# Patient Record
Sex: Male | Born: 1935
Health system: Southern US, Community
[De-identification: ages and names within clinical notes are randomized; demographics above are authoritative.]

## PROBLEM LIST (undated history)

## (undated) DIAGNOSIS — M48061 Spinal stenosis, lumbar region without neurogenic claudication: Secondary | ICD-10-CM

## (undated) DIAGNOSIS — E042 Nontoxic multinodular goiter: Secondary | ICD-10-CM

## (undated) DIAGNOSIS — M1990 Unspecified osteoarthritis, unspecified site: Secondary | ICD-10-CM

## (undated) DIAGNOSIS — Z860100 Personal history of colon polyps, unspecified: Secondary | ICD-10-CM

## (undated) DIAGNOSIS — I10 Essential (primary) hypertension: Secondary | ICD-10-CM

## (undated) DIAGNOSIS — I2510 Atherosclerotic heart disease of native coronary artery without angina pectoris: Secondary | ICD-10-CM

## (undated) DIAGNOSIS — N400 Enlarged prostate without lower urinary tract symptoms: Secondary | ICD-10-CM

## (undated) DIAGNOSIS — I872 Venous insufficiency (chronic) (peripheral): Secondary | ICD-10-CM

## (undated) DIAGNOSIS — IMO0002 Reserved for concepts with insufficient information to code with codable children: Secondary | ICD-10-CM

## (undated) DIAGNOSIS — R251 Tremor, unspecified: Secondary | ICD-10-CM

## (undated) DIAGNOSIS — I4891 Unspecified atrial fibrillation: Secondary | ICD-10-CM

## (undated) DIAGNOSIS — E039 Hypothyroidism, unspecified: Secondary | ICD-10-CM

## (undated) DIAGNOSIS — I739 Peripheral vascular disease, unspecified: Secondary | ICD-10-CM

## (undated) DIAGNOSIS — R9430 Abnormal result of cardiovascular function study, unspecified: Secondary | ICD-10-CM

## (undated) DIAGNOSIS — C439 Malignant melanoma of skin, unspecified: Secondary | ICD-10-CM

## (undated) DIAGNOSIS — I779 Disorder of arteries and arterioles, unspecified: Secondary | ICD-10-CM

## (undated) DIAGNOSIS — E785 Hyperlipidemia, unspecified: Secondary | ICD-10-CM

## (undated) DIAGNOSIS — R112 Nausea with vomiting, unspecified: Secondary | ICD-10-CM

## (undated) DIAGNOSIS — Z9889 Other specified postprocedural states: Secondary | ICD-10-CM

## (undated) DIAGNOSIS — M5416 Radiculopathy, lumbar region: Secondary | ICD-10-CM

## (undated) HISTORY — PX: KNEE SURGERY: SHX244

## (undated) HISTORY — DX: Essential (primary) hypertension: I10

## (undated) HISTORY — DX: Benign prostatic hyperplasia without lower urinary tract symptoms: N40.0

## (undated) HISTORY — DX: Spinal stenosis, lumbar region without neurogenic claudication: M48.061

## (undated) HISTORY — DX: Peripheral vascular disease, unspecified: I73.9

## (undated) HISTORY — DX: Venous insufficiency (chronic) (peripheral): I87.2

## (undated) HISTORY — DX: Hypothyroidism, unspecified: E03.9

## (undated) HISTORY — DX: Reserved for concepts with insufficient information to code with codable children: IMO0002

## (undated) HISTORY — DX: Malignant melanoma of skin, unspecified: C43.9

## (undated) HISTORY — DX: Personal history of colon polyps, unspecified: Z86.0100

## (undated) HISTORY — PX: TONSILLECTOMY: SUR1361

## (undated) HISTORY — DX: Atherosclerotic heart disease of native coronary artery without angina pectoris: I25.10

## (undated) HISTORY — DX: Disorder of arteries and arterioles, unspecified: I77.9

## (undated) HISTORY — DX: Nontoxic multinodular goiter: E04.2

## (undated) HISTORY — DX: Abnormal result of cardiovascular function study, unspecified: R94.30

## (undated) HISTORY — DX: Hyperlipidemia, unspecified: E78.5

## (undated) HISTORY — DX: Personal history of colonic polyps: Z86.010

## (undated) HISTORY — PX: COLONOSCOPY: SHX174

## (undated) HISTORY — DX: Tremor, unspecified: R25.1

## (undated) HISTORY — DX: Radiculopathy, lumbar region: M54.16

---

## 1995-09-22 HISTORY — PX: TOE SURGERY: SHX1073

## 1998-09-19 ENCOUNTER — Encounter: Admission: RE | Admit: 1998-09-19 | Discharge: 1998-12-18 | Payer: Self-pay | Admitting: *Deleted

## 2004-08-15 ENCOUNTER — Ambulatory Visit: Payer: Self-pay | Admitting: Internal Medicine

## 2004-09-21 HISTORY — PX: CORONARY ANGIOPLASTY WITH STENT PLACEMENT: SHX49

## 2005-07-22 ENCOUNTER — Ambulatory Visit: Payer: Self-pay | Admitting: Internal Medicine

## 2005-07-31 ENCOUNTER — Ambulatory Visit: Payer: Self-pay

## 2005-07-31 ENCOUNTER — Ambulatory Visit: Payer: Self-pay | Admitting: Cardiology

## 2005-08-03 ENCOUNTER — Ambulatory Visit: Payer: Self-pay | Admitting: Cardiology

## 2005-08-04 ENCOUNTER — Inpatient Hospital Stay (HOSPITAL_BASED_OUTPATIENT_CLINIC_OR_DEPARTMENT_OTHER): Admission: RE | Admit: 2005-08-04 | Discharge: 2005-08-04 | Payer: Self-pay | Admitting: Cardiovascular Disease

## 2005-08-04 ENCOUNTER — Ambulatory Visit: Payer: Self-pay | Admitting: Cardiovascular Disease

## 2005-08-04 ENCOUNTER — Inpatient Hospital Stay (HOSPITAL_COMMUNITY): Admission: AD | Admit: 2005-08-04 | Discharge: 2005-08-06 | Payer: Self-pay | Admitting: Cardiovascular Disease

## 2005-08-19 ENCOUNTER — Ambulatory Visit: Payer: Self-pay | Admitting: Cardiology

## 2005-08-27 ENCOUNTER — Encounter (HOSPITAL_COMMUNITY): Admission: RE | Admit: 2005-08-27 | Discharge: 2005-11-25 | Payer: Self-pay | Admitting: Cardiology

## 2005-09-01 ENCOUNTER — Ambulatory Visit: Payer: Self-pay | Admitting: Internal Medicine

## 2005-09-21 HISTORY — PX: TOTAL HIP ARTHROPLASTY: SHX124

## 2005-10-22 ENCOUNTER — Ambulatory Visit: Payer: Self-pay | Admitting: Cardiology

## 2005-10-29 ENCOUNTER — Ambulatory Visit: Payer: Self-pay | Admitting: Internal Medicine

## 2005-11-16 ENCOUNTER — Ambulatory Visit: Payer: Self-pay | Admitting: Internal Medicine

## 2005-11-26 ENCOUNTER — Encounter (HOSPITAL_COMMUNITY): Admission: RE | Admit: 2005-11-26 | Discharge: 2005-12-17 | Payer: Self-pay | Admitting: Cardiology

## 2005-11-30 ENCOUNTER — Encounter: Admission: RE | Admit: 2005-11-30 | Discharge: 2006-01-26 | Payer: Self-pay | Admitting: Internal Medicine

## 2006-04-26 ENCOUNTER — Ambulatory Visit: Payer: Self-pay | Admitting: Cardiology

## 2006-07-23 ENCOUNTER — Ambulatory Visit: Payer: Self-pay | Admitting: Internal Medicine

## 2006-07-23 LAB — CONVERTED CEMR LAB
ALT: 19 units/L (ref 0–40)
AST: 23 units/L (ref 0–37)
BUN: 11 mg/dL (ref 6–23)
Cholesterol: 164 mg/dL (ref 0–200)
Creatinine, Ser: 1 mg/dL (ref 0.4–1.5)
HDL: 43.5 mg/dL (ref >39.0)
PSA: 0.57 ng/mL (ref 0.10–4.00)
Potassium: 3.9 meq/L (ref 3.5–5.1)

## 2006-07-30 ENCOUNTER — Ambulatory Visit: Payer: Self-pay | Admitting: Cardiology

## 2006-09-15 ENCOUNTER — Inpatient Hospital Stay (HOSPITAL_COMMUNITY): Admission: RE | Admit: 2006-09-15 | Discharge: 2006-09-19 | Payer: Self-pay | Admitting: Orthopedic Surgery

## 2006-09-16 ENCOUNTER — Ambulatory Visit: Payer: Self-pay | Admitting: Internal Medicine

## 2006-11-23 ENCOUNTER — Ambulatory Visit: Payer: Self-pay | Admitting: Internal Medicine

## 2006-11-23 LAB — CONVERTED CEMR LAB
Hgb A1c MFr Bld: 5.7 % (ref 4.6–6.0)
LDL Cholesterol: 84 mg/dL (ref 0–99)
VLDL: 15 mg/dL (ref 0–40)

## 2007-01-28 ENCOUNTER — Ambulatory Visit: Payer: Self-pay | Admitting: Cardiology

## 2007-02-28 ENCOUNTER — Encounter: Payer: Self-pay | Admitting: Cardiology

## 2007-03-04 ENCOUNTER — Ambulatory Visit: Payer: Self-pay | Admitting: Cardiology

## 2007-04-28 ENCOUNTER — Ambulatory Visit: Payer: Self-pay | Admitting: Cardiology

## 2007-08-05 DIAGNOSIS — Z87898 Personal history of other specified conditions: Secondary | ICD-10-CM

## 2007-08-08 DIAGNOSIS — M1990 Unspecified osteoarthritis, unspecified site: Secondary | ICD-10-CM | POA: Insufficient documentation

## 2007-08-08 DIAGNOSIS — R011 Cardiac murmur, unspecified: Secondary | ICD-10-CM

## 2007-08-10 ENCOUNTER — Ambulatory Visit: Payer: Self-pay | Admitting: Internal Medicine

## 2007-08-10 DIAGNOSIS — E039 Hypothyroidism, unspecified: Secondary | ICD-10-CM

## 2007-08-10 DIAGNOSIS — E782 Mixed hyperlipidemia: Secondary | ICD-10-CM

## 2007-08-10 DIAGNOSIS — I10 Essential (primary) hypertension: Secondary | ICD-10-CM

## 2007-08-10 LAB — CONVERTED CEMR LAB
HDL goal, serum: 40 mg/dL
LDL Goal: 130 mg/dL

## 2007-08-13 LAB — CONVERTED CEMR LAB
ALT: 24 units/L (ref 0–53)
AST: 26 units/L (ref 0–37)
Albumin: 4.2 g/dL (ref 3.5–5.2)
Alkaline Phosphatase: 65 units/L (ref 39–117)
BUN: 12 mg/dL (ref 6–23)
Basophils Absolute: 0 10*3/uL (ref 0.0–0.1)
Basophils Relative: 0.1 % (ref 0.0–1.0)
Bilirubin, Direct: 0.1 mg/dL (ref 0.0–0.3)
CO2: 29 meq/L (ref 19–32)
Calcium: 9.7 mg/dL (ref 8.4–10.5)
Chloride: 99 meq/L (ref 96–112)
Cholesterol: 154 mg/dL (ref 0–200)
Creatinine, Ser: 1.1 mg/dL (ref 0.4–1.5)
Eosinophils Absolute: 0.4 10*3/uL (ref 0.0–0.6)
Eosinophils Relative: 5.1 % — ABNORMAL HIGH (ref 0.0–5.0)
GFR calc Af Amer: 85 mL/min
GFR calc non Af Amer: 71 mL/min
Glucose, Bld: 97 mg/dL (ref 70–99)
HCT: 49.4 % (ref 39.0–52.0)
HDL: 39.4 mg/dL (ref >39.0)
Hemoglobin: 16.9 g/dL (ref 13.0–17.0)
LDL Cholesterol: 94 mg/dL (ref 0–99)
Lymphocytes Relative: 19.3 % (ref 12.0–46.0)
MCHC: 34.2 g/dL (ref 30.0–36.0)
MCV: 92 fL (ref 78.0–100.0)
Monocytes Absolute: 0.7 10*3/uL (ref 0.2–0.7)
Monocytes Relative: 10.4 % (ref 3.0–11.0)
Neutro Abs: 4.6 10*3/uL (ref 1.4–7.7)
Neutrophils Relative %: 65.1 % (ref 43.0–77.0)
PSA: 0.62 ng/mL (ref 0.10–4.00)
Platelets: 252 10*3/uL (ref 150–400)
Potassium: 3.8 meq/L (ref 3.5–5.1)
RBC: 5.37 M/uL (ref 4.22–5.81)
RDW: 13.3 % (ref 11.5–14.6)
Sodium: 138 meq/L (ref 135–145)
TSH: 6.79 microintl units/mL — ABNORMAL HIGH (ref 0.35–5.50)
Total Bilirubin: 1.1 mg/dL (ref 0.3–1.2)
Total CHOL/HDL Ratio: 3.9
Total Protein: 7.4 g/dL (ref 6.0–8.3)
Triglycerides: 102 mg/dL (ref 0–149)
VLDL: 20 mg/dL (ref 0–40)
WBC: 7.1 10*3/uL (ref 4.5–10.5)

## 2007-08-15 ENCOUNTER — Encounter (INDEPENDENT_AMBULATORY_CARE_PROVIDER_SITE_OTHER): Payer: Self-pay | Admitting: *Deleted

## 2007-10-14 ENCOUNTER — Ambulatory Visit: Payer: Self-pay | Admitting: Gastroenterology

## 2007-10-21 ENCOUNTER — Ambulatory Visit: Payer: Self-pay | Admitting: Internal Medicine

## 2007-10-24 ENCOUNTER — Encounter (INDEPENDENT_AMBULATORY_CARE_PROVIDER_SITE_OTHER): Payer: Self-pay | Admitting: *Deleted

## 2007-10-31 ENCOUNTER — Encounter: Payer: Self-pay | Admitting: Gastroenterology

## 2007-10-31 ENCOUNTER — Encounter: Payer: Self-pay | Admitting: Internal Medicine

## 2007-10-31 ENCOUNTER — Ambulatory Visit: Payer: Self-pay | Admitting: Gastroenterology

## 2007-11-02 ENCOUNTER — Ambulatory Visit: Payer: Self-pay | Admitting: Cardiology

## 2008-04-04 ENCOUNTER — Ambulatory Visit: Payer: Self-pay | Admitting: Internal Medicine

## 2008-05-10 ENCOUNTER — Ambulatory Visit: Payer: Self-pay | Admitting: Internal Medicine

## 2008-05-10 DIAGNOSIS — D126 Benign neoplasm of colon, unspecified: Secondary | ICD-10-CM

## 2008-06-27 ENCOUNTER — Ambulatory Visit: Payer: Self-pay | Admitting: Internal Medicine

## 2008-09-21 HISTORY — PX: TOTAL HIP ARTHROPLASTY: SHX124

## 2008-10-23 ENCOUNTER — Ambulatory Visit: Payer: Self-pay | Admitting: Cardiology

## 2008-11-09 ENCOUNTER — Ambulatory Visit: Payer: Self-pay | Admitting: Internal Medicine

## 2008-11-09 DIAGNOSIS — E119 Type 2 diabetes mellitus without complications: Secondary | ICD-10-CM | POA: Insufficient documentation

## 2008-11-09 DIAGNOSIS — Z860100 Personal history of colon polyps, unspecified: Secondary | ICD-10-CM | POA: Insufficient documentation

## 2008-11-09 DIAGNOSIS — Z85820 Personal history of malignant melanoma of skin: Secondary | ICD-10-CM

## 2008-11-09 DIAGNOSIS — R739 Hyperglycemia, unspecified: Secondary | ICD-10-CM | POA: Insufficient documentation

## 2008-11-10 LAB — CONVERTED CEMR LAB
ALT: 23 units/L (ref 0–53)
AST: 27 units/L (ref 0–37)
Albumin: 4.3 g/dL (ref 3.5–5.2)
Alkaline Phosphatase: 51 units/L (ref 39–117)
BUN: 12 mg/dL (ref 6–23)
Basophils Absolute: 0 10*3/uL (ref 0.0–0.1)
Basophils Relative: 0.2 % (ref 0.0–3.0)
Bilirubin, Direct: 0.1 mg/dL (ref 0.0–0.3)
Cholesterol: 157 mg/dL (ref 0–200)
Creatinine, Ser: 1.1 mg/dL (ref 0.4–1.5)
Eosinophils Absolute: 0.3 10*3/uL (ref 0.0–0.7)
Eosinophils Relative: 4.6 % (ref 0.0–5.0)
HCT: 48.5 % (ref 39.0–52.0)
HDL: 45.6 mg/dL (ref >39.0)
Hemoglobin: 16.7 g/dL (ref 13.0–17.0)
Hgb A1c MFr Bld: 5.9 % (ref 4.6–6.0)
LDL Cholesterol: 93 mg/dL (ref 0–99)
Lymphocytes Relative: 23.3 % (ref 12.0–46.0)
MCHC: 34.5 g/dL (ref 30.0–36.0)
MCV: 91.8 fL (ref 78.0–100.0)
Monocytes Absolute: 0.6 10*3/uL (ref 0.1–1.0)
Monocytes Relative: 10.3 % (ref 3.0–12.0)
Neutro Abs: 3.4 10*3/uL (ref 1.4–7.7)
Neutrophils Relative %: 61.6 % (ref 43.0–77.0)
PSA: 0.91 ng/mL (ref 0.10–4.00)
Platelets: 191 10*3/uL (ref 150–400)
Potassium: 3.8 meq/L (ref 3.5–5.1)
RBC: 5.29 M/uL (ref 4.22–5.81)
RDW: 13.7 % (ref 11.5–14.6)
TSH: 2.44 microintl units/mL (ref 0.35–5.50)
Total Bilirubin: 1.2 mg/dL (ref 0.3–1.2)
Total CHOL/HDL Ratio: 3.4
Total Protein: 7.3 g/dL (ref 6.0–8.3)
Triglycerides: 93 mg/dL (ref 0–149)
VLDL: 19 mg/dL (ref 0–40)
WBC: 5.6 10*3/uL (ref 4.5–10.5)

## 2008-11-12 ENCOUNTER — Encounter (INDEPENDENT_AMBULATORY_CARE_PROVIDER_SITE_OTHER): Payer: Self-pay | Admitting: *Deleted

## 2008-11-21 ENCOUNTER — Telehealth (INDEPENDENT_AMBULATORY_CARE_PROVIDER_SITE_OTHER): Payer: Self-pay | Admitting: *Deleted

## 2008-11-26 ENCOUNTER — Telehealth (INDEPENDENT_AMBULATORY_CARE_PROVIDER_SITE_OTHER): Payer: Self-pay | Admitting: *Deleted

## 2008-11-27 ENCOUNTER — Ambulatory Visit: Payer: Self-pay | Admitting: Internal Medicine

## 2008-12-06 ENCOUNTER — Telehealth: Payer: Self-pay | Admitting: Internal Medicine

## 2009-01-03 ENCOUNTER — Encounter: Payer: Self-pay | Admitting: Cardiology

## 2009-08-21 ENCOUNTER — Ambulatory Visit: Payer: Self-pay | Admitting: Internal Medicine

## 2009-10-14 ENCOUNTER — Telehealth (INDEPENDENT_AMBULATORY_CARE_PROVIDER_SITE_OTHER): Payer: Self-pay | Admitting: *Deleted

## 2009-10-22 ENCOUNTER — Encounter: Payer: Self-pay | Admitting: Cardiology

## 2009-10-23 ENCOUNTER — Ambulatory Visit: Payer: Self-pay | Admitting: Cardiology

## 2009-10-29 ENCOUNTER — Telehealth (INDEPENDENT_AMBULATORY_CARE_PROVIDER_SITE_OTHER): Payer: Self-pay

## 2009-10-30 ENCOUNTER — Encounter (HOSPITAL_COMMUNITY): Admission: RE | Admit: 2009-10-30 | Discharge: 2009-12-25 | Payer: Self-pay | Admitting: Cardiology

## 2009-10-30 ENCOUNTER — Ambulatory Visit: Payer: Self-pay | Admitting: Cardiovascular Disease

## 2009-10-30 ENCOUNTER — Ambulatory Visit: Payer: Self-pay

## 2009-11-04 ENCOUNTER — Ambulatory Visit: Payer: Self-pay | Admitting: Internal Medicine

## 2009-11-11 ENCOUNTER — Ambulatory Visit: Payer: Self-pay | Admitting: Internal Medicine

## 2009-11-11 DIAGNOSIS — E876 Hypokalemia: Secondary | ICD-10-CM | POA: Insufficient documentation

## 2009-11-11 LAB — CONVERTED CEMR LAB
ALT: 23 units/L (ref 0–53)
Albumin: 4 g/dL (ref 3.5–5.2)
Alkaline Phosphatase: 55 units/L (ref 39–117)
BUN: 17 mg/dL (ref 6–23)
Bilirubin, Direct: 0 mg/dL (ref 0.0–0.3)
Cholesterol: 142 mg/dL (ref 0–200)
Creatinine, Ser: 1 mg/dL (ref 0.4–1.5)
Eosinophils Relative: 9.4 % — ABNORMAL HIGH (ref 0.0–5.0)
GFR calc non Af Amer: 78.15 mL/min (ref >60)
Lymphocytes Relative: 28.2 % (ref 12.0–46.0)
Monocytes Relative: 11.7 % (ref 3.0–12.0)
Neutrophils Relative %: 49.8 % (ref 43.0–77.0)
PSA: 0.92 ng/mL (ref 0.10–4.00)
Platelets: 214 10*3/uL (ref 150.0–400.0)
Potassium: 3.4 meq/L — ABNORMAL LOW (ref 3.5–5.1)
RBC: 4.92 M/uL (ref 4.22–5.81)
TSH: 1.76 microintl units/mL (ref 0.35–5.50)
Total Protein: 6.6 g/dL (ref 6.0–8.3)
Triglycerides: 75 mg/dL (ref 0.0–149.0)
VLDL: 15 mg/dL (ref 0.0–40.0)
WBC: 6.1 10*3/uL (ref 4.5–10.5)

## 2009-11-14 ENCOUNTER — Encounter (INDEPENDENT_AMBULATORY_CARE_PROVIDER_SITE_OTHER): Payer: Self-pay | Admitting: *Deleted

## 2009-11-14 ENCOUNTER — Ambulatory Visit: Payer: Self-pay | Admitting: Internal Medicine

## 2009-11-14 LAB — CONVERTED CEMR LAB
OCCULT 1: NEGATIVE
OCCULT 2: NEGATIVE
OCCULT 3: NEGATIVE

## 2009-11-20 ENCOUNTER — Telehealth: Payer: Self-pay | Admitting: Internal Medicine

## 2009-11-26 ENCOUNTER — Ambulatory Visit: Payer: Self-pay | Admitting: Internal Medicine

## 2009-11-26 DIAGNOSIS — R609 Edema, unspecified: Secondary | ICD-10-CM

## 2010-01-01 ENCOUNTER — Inpatient Hospital Stay (HOSPITAL_COMMUNITY): Admission: RE | Admit: 2010-01-01 | Discharge: 2010-01-05 | Payer: Self-pay | Admitting: Orthopedic Surgery

## 2010-01-10 ENCOUNTER — Emergency Department (HOSPITAL_COMMUNITY): Admission: EM | Admit: 2010-01-10 | Discharge: 2010-01-10 | Payer: Self-pay | Admitting: Emergency Medicine

## 2010-03-10 ENCOUNTER — Telehealth: Payer: Self-pay | Admitting: Cardiology

## 2010-03-31 ENCOUNTER — Ambulatory Visit: Payer: Self-pay | Admitting: Internal Medicine

## 2010-04-07 LAB — CONVERTED CEMR LAB
AST: 24 units/L (ref 0–37)
BUN: 21 mg/dL (ref 6–23)
Creatinine, Ser: 1.1 mg/dL (ref 0.4–1.5)

## 2010-05-13 ENCOUNTER — Ambulatory Visit: Payer: Self-pay | Admitting: Cardiology

## 2010-06-23 ENCOUNTER — Telehealth (INDEPENDENT_AMBULATORY_CARE_PROVIDER_SITE_OTHER): Payer: Self-pay | Admitting: *Deleted

## 2010-08-19 ENCOUNTER — Ambulatory Visit: Payer: Self-pay | Admitting: Internal Medicine

## 2010-09-11 ENCOUNTER — Telehealth (INDEPENDENT_AMBULATORY_CARE_PROVIDER_SITE_OTHER): Payer: Self-pay | Admitting: *Deleted

## 2010-10-21 NOTE — Assessment & Plan Note (Signed)
Summary: f1y  Medications Added VITAMIN C 500 MG CHEW (ASCORBIC ACID) 1-2 tab once daily LIPITOR 40 MG  TABS (ATORVASTATIN CALCIUM) 1/2 tablet  by mouth once daily DILTIAZEM HCL ER BEADS 180 MG  CP24 (DILTIAZEM HCL ER BEADS) 1 by mouth once daily FLAX SEEDS  POWD (FLAXSEED (LINSEED)) use as directed      Allergies Added: NKDA  Visit Type:  surgical clearance Referring Provider:  Aluisio Primary Provider:  Dr Alwyn Ren  CC:  CAD.  History of Present Illness: The patient is seen for cardiology followup.  He has known coronary disease.  In 2006 there was an abnormal EKG.  Patient was not having symptoms at that time.  Stress test was abnormal and he ultimately need an intervention to the circumflex and right coronary arteries.  He's done well since then.  He has not had exercise testing since then.  He had an echo in 2008 showing good LV function.  He needs to have hip surgery.  He's here for cardiac clearance.  He denies symptoms.  His wife says that during the holiday time he had some shortness of breath with exertion.  He's not had syncope or presyncope.  Problems Prior to Update: 1)  Edema  (ICD-782.3) 2)  Cad, Native Vessel  (ICD-414.01) 3)  Hypertension, Essential Nos  (ICD-401.9) 4)  Hyperlipidemia  (ICD-272.2) 5)  Uri  (ICD-465.9) 6)  Fasting Hyperglycemia  (ICD-790.29) 7)  Melanoma, Hx of  (ICD-V10.82) 8)  Skin Cancer, Hx of  (ICD-V10.83) 9)  Colonic Polyps, Hx of  (ICD-V12.72) 10)  Colonic Polyps, Hyperplastic  (ICD-211.3) 11)  Hypothyroidism  (ICD-244.9) 12)  Hx of Degenerative Joint Disease  (ICD-715.90) 13)  Hx of Murmur  (ICD-785.2) 14)  Benign Prostatic Hypertrophy, Hx of  (ICD-V13.8)  Current Medications (verified): 1)  Triamterene-Hctz 37.5-25 Mg Tabs (Triamterene-Hctz) .Marland Kitchen.. 1 By Mouth Qd 2)  Benazepril Hcl 20 Mg Tabs (Benazepril Hcl) .Marland Kitchen.. 1 By Mouth Qd 3)  Levothroid 150 Mcg Tabs (Levothyroxine Sodium) .Marland Kitchen.. 1 By Mouth Once Daily Except  1&1/2 Every Weds 4)   Metoprolol Succinate 25 Mg  Tb24 (Metoprolol Succinate) .Marland Kitchen.. 1 By Mouth Qd 5)  Cvs Aspirin 325 Mg  Tabs (Aspirin) .... Take 1 Tablet By Mouth Daily As Directed 6)  Vitamin C 500 Mg Chew (Ascorbic Acid) .Marland Kitchen.. 1-2 Tab Once Daily 7)  Lipitor 40 Mg  Tabs (Atorvastatin Calcium) .... 1/2 Tablet  By Mouth Once Daily 8)  Diltiazem Hcl Er Beads 180 Mg  Cp24 (Diltiazem Hcl Er Beads) .Marland Kitchen.. 1 By Mouth Once Daily 9)  Amoxicillin 500 Mg  Caps (Amoxicillin) .... 4 Caps 1 Hr Prior To Dental Visit 10)  Nitroglycerin 0.4 Mg Subl (Nitroglycerin) .... One Tablet Under Tongue Every 5 Minutes As Needed For Chest Pain---May Repeat Times Three 11)  Fluticasone Propionate 50 Mcg/act Susp (Fluticasone Propionate) .Marland Kitchen.. 1spray Once Daily - Two Times A Day As Needed 12)  Augmentin 500-125 Mg Tabs (Amoxicillin-Pot Clavulanate) .... Take 1 Tab  Two Times A Day With Meals 13)  Flax Seeds  Powd (Flaxseed (Linseed)) .... Use As Directed  Allergies (verified): No Known Drug Allergies  Past History:  Past Medical History: Last updated: 10/22/2009 Hypertension Hypothyroidism Hyperlipidemia Benign prostatic hypertrophy Click murmur,PMH of Colonic polyps, hx of Skin cancer, hx of, melanoma, Dr Margo Aye CAD..   DES.  Cx  and RCA...2006 Peripheral edema....venous insufficiency     Review of Systems       Patient denies fever, chills, headache, sweats, rash, change in vision,  change in hearing,chest pain, cough, nausea vomiting, urinary symptoms.  He has problems with his hip and knee surgery.  All other systems are reviewed and are negative.  Vital Signs:  Patient profile:   75 year old male Height:      67 inches Weight:      191 pounds BMI:     30.02 Pulse rate:   63 / minute Pulse rhythm:   irregular BP sitting:   130 / 70  (left arm) Cuff size:   large  Vitals Entered By: Danielle Rankin, CMA (October 23, 2009 2:32 PM)  Physical Exam  General:  patient is stable in general. Head:  head is atraumatic. Eyes:  no  xanthelasma. Neck:  no jugular venous distention. Chest Wall:  no chest wall tenderness. Lungs:  lungs are clear.  Respiratory effort is nonlabored. Heart:  cardiac exam is S1-S2.  No clicks or significant murmurs. Abdomen:  abdomen is soft. Msk:  no musculoskeletal deformities. Extremities:  no peripheral edema. Skin:  no skin rashes. Psych:  patient is oriented to person time and place.  Affect is normal   Impression & Recommendations:  Problem # 1:  EDEMA (ICD-782.3) patient has no sign of edema at this time.  No further workup is needed.  Problem # 2:  CAD, NATIVE VESSEL (ICD-414.01)  His updated medication list for this problem includes:    Benazepril Hcl 20 Mg Tabs (Benazepril hcl) .Marland Kitchen... 1 by mouth qd    Metoprolol Succinate 25 Mg Tb24 (Metoprolol succinate) .Marland Kitchen... 1 by mouth qd    Cvs Aspirin 325 Mg Tabs (Aspirin) .Marland Kitchen... Take 1 tablet by mouth daily as directed    Diltiazem Hcl Er Beads 180 Mg Cp24 (Diltiazem hcl er beads) .Marland Kitchen... 1 by mouth once daily    Nitroglycerin 0.4 Mg Subl (Nitroglycerin) ..... One tablet under tongue every 5 minutes as needed for chest pain---may repeat times three  Orders: EKG w/ Interpretation (93000) Nuclear Stress Test (Nuc Stress Test)  EKG is done today.  They're sinus rhythm.  There are no significant changes.  The EKG is reviewed by me.  Patient gives a history of some exertional shortness of breath in the past several months.  He is not worried about it but his wife is quite concerned.  He has not had any type of exercise testing since his intervention in 2006.  He has moderate risk at his current surgery that is being considered.  Pus there is question of some symptoms we will proceed with a Myoview scan.  If this is normal he'll be cleared for his surgery.  Patient Instructions: 1)  Your physician recommends that you schedule a follow-up appointment in: 6 months 2)  Lexiscan Myoview. Prescriptions: LIPITOR 40 MG  TABS (ATORVASTATIN CALCIUM)  1/2 tablet  by mouth once daily  #90 x 3   Entered by:   Danielle Rankin, CMA   Authorized by:   Talitha Givens, MD, [facility]   Signed by:   Danielle Rankin, CMA on 10/23/2009   Method used:   Electronically to        [facility]. [phone]* (retail)       [address]       [place], [place]  [address]       Ph: [phone]       Fax: [phone]   RxID:   [id number] DILTIAZEM HCL ER BEADS 180 MG  CP24 (DILTIAZEM HCL ER  BEADS) 1 by mouth once daily  #90 x 3   Entered by:   Danielle Rankin, CMA   Authorized by:   Talitha Givens, MD, [facility]   Signed by:   Danielle Rankin, CMA on 10/23/2009   Method used:   Electronically to        [facility]. [phone]* (retail)       [address]       [place], [place]  [address]       Ph: [phone]       Fax: [phone]   RxID:   [id number]

## 2010-10-21 NOTE — Progress Notes (Signed)
Summary: Med Concerns  Phone Note Refill Request Call back at [phone] Message from:  Fax from Pharmacy on June 23, 2010 10:28 AM  Refills Requested: Medication #1:  LEVOTHROID 150 MCG TABS 1 by mouth once daily except  1&1/2 every Weds [facility] [address] -fax [phone]  Initial call taken by: Okey Regal [place],  June 23, 2010 10:29 AM  Follow-up for Phone Call        VM left by pharmacist stating pt was given the levothyroxine last time and wants to know if he need to continue with this or does he need to go back to LEVOTHROID. Pt is just about out of med.pls advise..............Marland KitchenFelecia Deloach CMA  June 23, 2010 1:05 PM   Additional Follow-up for Phone Call Additional follow up Details #1::        I called the pharmacy and spoke with Luisa Hart and discussed patient to continue to receive what he always received the Levothyroid is whats on his med list. Info was ok'd and the patient already picked up med on 06/22/10 (yesterday) Additional Follow-up by: Shonna Chock CMA,  June 23, 2010 1:46 PM

## 2010-10-21 NOTE — Progress Notes (Signed)
Summary: Nuc. Pre-Procedure  Phone Note Outgoing Call Call back at [facility] Phone [phone]   Call placed by: Irean Hong, RN,  October 29, 2009 2:45 PM Summary of Call: Reviewed information on Myoview Information Sheet (see scanned document for further details).  Spoke with patient.     Nuclear Med Background Indications for Stress Test: Evaluation for Ischemia, Surgical Clearance, Stent Patency  Indications Comments: Pending  hip surgery by Dr. Lequita Halt.  History: Abnormal EKG, Echo, Heart Catheterization, Myocardial Perfusion Study   Symptoms: DOE    Nuclear Pre-Procedure Height (in): 67

## 2010-10-21 NOTE — Miscellaneous (Signed)
  Clinical Lists Changes  Observations: Added new observation of PAST MED HX: Hypertension Hypothyroidism Hyperlipidemia Benign prostatic hypertrophy Click murmur,PMH of Colonic polyps, hx of Skin cancer, hx of, melanoma, Dr Margo Aye CAD..   DES.  Cx  and RCA...2006 Peripheral edema....venous insufficiency    (10/22/2009 18:19)       Past History:  Past Medical History: Hypertension Hypothyroidism Hyperlipidemia Benign prostatic hypertrophy Click murmur,PMH of Colonic polyps, hx of Skin cancer, hx of, melanoma, Dr Margo Aye CAD..   DES.  Cx  and RCA...2006 Peripheral edema....venous insufficiency

## 2010-10-21 NOTE — Progress Notes (Signed)
Summary: Phone  Phone Note Call from Patient   Caller: Patient Summary of Call: patient has a cpx scheduled on feb 21,2011. patient is requesting labs done prior. patient has medicare complete as insurance. Can we add labs orders. Initial call taken by: Barb Merino,  October 14, 2009 4:28 PM  Follow-up for Phone Call        Yes use these codes Lipid/Hep 272.4/995.20, bmp,cbcd,tsh,psa,udip & stool cards 790.29/401.9/244.9 Follow-up by: Shonna Chock,  October 14, 2009 5:06 PM    Additional Follow-up for Phone Call Additional follow up Details #2::    left message to return call.Marland KitchenMarland KitchenMarland KitchenBarb Merino  October 15, 2009 10:47 AM   patient is scheduled for lab work on feb 14,2011.Marland KitchenMarland KitchenBarb Merino  October 15, 2009 11:50 AM   Follow-up by: Barb Merino,  October 15, 2009 10:47 AM

## 2010-10-21 NOTE — Assessment & Plan Note (Signed)
Summary: per check out/sf      Allergies Added:   Visit Type:  Follow-up Referring Provider:  Aluisio Primary Provider:  Dr Alwyn Ren  CC:  CAD.  History of Present Illness: The patient is seen for followup of coronary artery disease very I saw him last February, 2011.  Since that time he has had a hip replacement of the right hip.  The procedure went well.  He's had some swelling in his left leg.  It is mild.  Dr. Alwyn Ren has adjusted his diuretic to help keep his potassium normal.  The patient is not having any signs or symptoms of heart failure.  There is no chest pain.  He is not having shortness of breath.  Current Medications (verified): 1)  Benazepril Hcl 20 Mg Tabs (Benazepril Hcl) .... Take 1&1/2 Tab Once Daily 2)  Levothroid 150 Mcg Tabs (Levothyroxine Sodium) .Marland Kitchen.. 1 By Mouth Once Daily Except  1&1/2 Every Weds 3)  Metoprolol Succinate 25 Mg  Tb24 (Metoprolol Succinate) .Marland Kitchen.. 1 By Mouth Qd 4)  Cvs Aspirin 325 Mg  Tabs (Aspirin) .... Take 1 Tablet By Mouth Daily As Directed 5)  Vitamin C 500 Mg Chew (Ascorbic Acid) .Marland Kitchen.. 1-2 Tab Once Daily 6)  Lipitor 40 Mg  Tabs (Atorvastatin Calcium) .... 1/2 Tablet  By Mouth Once Daily 7)  Diltiazem Hcl Er Beads 180 Mg  Cp24 (Diltiazem Hcl Er Beads) .Marland Kitchen.. 1 By Mouth Once Daily 8)  Amoxicillin 500 Mg  Caps (Amoxicillin) .... 4 Caps 1 Hr Prior To Dental Visit 9)  Nitroglycerin 0.4 Mg Subl (Nitroglycerin) .... One Tablet Under Tongue Every 5 Minutes As Needed For Chest Pain---May Repeat Times Three 10)  Fluticasone Propionate 50 Mcg/act Susp (Fluticasone Propionate) .Marland Kitchen.. 1spray Once Daily - Two Times A Day As Needed 11)  Flax Seeds  Powd (Flaxseed (Linseed)) .... Use As Directed 12)  Spironolactone 25 Mg Tabs (Spironolactone) .Marland Kitchen.. 1 Once Daily  Allergies (verified): 1)  ! Codeine 2)  ! Amlodipine Besylate (Amlodipine Besylate)  Past History:  Past Medical History: Hypertension Hypothyroidism Hyperlipidemia Benign prostatic hypertrophy Click  murmur,PMH of Colonic polyps, hx of Skin cancer, hx of, melanoma, Dr Suan Halter CAD, DES, Circumflex  and RCA  2006  /   nuclear... February, 2011... EF 70%... no ischemia Venous insufficiency, PMH of     Review of Systems       Patient denies fever, chills, headache, sweats, rash,change in vision, change in hearing, chest pain, cough, nausea vomiting, urinary symptoms.  All other systems are reviewed and are negative.  Vital Signs:  Patient profile:   75 year old male Height:      67.75 inches Weight:      192 pounds BMI:     29.52 Pulse rate:   65 / minute BP sitting:   126 / 68  (right arm) Cuff size:   regular  Vitals Entered By: Hardin Negus, RMA (May 13, 2010 2:52 PM)  Physical Exam  General:  patient is overweight but stable. Eyes:  no xanthelasma. Neck:  no jugular distention. Lungs:  lungs are clear.  Respiratory effort is nonlabored. Heart:  cardiac exam revealed an S1 and S2.  No clicks or significant murmurs. Abdomen:  abdomen is soft but protuberant. Msk:  right leg is mildly swollen following his right hip replacement. Extremities:  trace edema in the left lower extremity. Skin:  no skin rashes. Psych:  patient is oriented to person time and place.  Affect is normal.   Impression &  Recommendations:  Problem # 1:  EDEMA (ICD-782.3) There is trace edema in the left lower extremity.  I believe this is from venous insufficiency.  There is no sign of heart failure.  Further workup is not needed.  He is to watch his salt intake.  Problem # 2:  CAD, NATIVE VESSEL (ICD-414.01)  His updated medication list for this problem includes:    Benazepril Hcl 20 Mg Tabs (Benazepril hcl) .Marland Kitchen... Take 1&1/2 tab once daily    Metoprolol Succinate 25 Mg Tb24 (Metoprolol succinate) .Marland Kitchen... 1 by mouth qd    Cvs Aspirin 325 Mg Tabs (Aspirin) .Marland Kitchen... Take 1 tablet by mouth daily as directed    Diltiazem Hcl Er Beads 180 Mg Cp24 (Diltiazem hcl er beads) .Marland Kitchen... 1 by mouth once daily     Nitroglycerin 0.4 Mg Subl (Nitroglycerin) ..... One tablet under tongue every 5 minutes as needed for chest pain---may repeat times three The patient's nuclear scan in February, 2011 showed normal ejection fraction and no ischemia.  No further workup is needed.  Problem # 3:  HYPERTENSION, ESSENTIAL NOS (ICD-401.9)  His updated medication list for this problem includes:    Benazepril Hcl 20 Mg Tabs (Benazepril hcl) .Marland Kitchen... Take 1&1/2 tab once daily    Metoprolol Succinate 25 Mg Tb24 (Metoprolol succinate) .Marland Kitchen... 1 by mouth qd    Cvs Aspirin 325 Mg Tabs (Aspirin) .Marland Kitchen... Take 1 tablet by mouth daily as directed    Diltiazem Hcl Er Beads 180 Mg Cp24 (Diltiazem hcl er beads) .Marland Kitchen... 1 by mouth once daily    Spironolactone 25 Mg Tabs (Spironolactone) .Marland Kitchen... 1 once daily Blood pressure is under good control.  No change in therapy.  Patient Instructions: 1)  Your physician wants you to follow-up in:  6 months.  You will receive a reminder letter in the mail two months in advance. If you don't receive a letter, please call our office to schedule the follow-up appointment. Prescriptions: LIPITOR 40 MG  TABS (ATORVASTATIN CALCIUM) 1/2 tablet  by mouth once daily  #30 x 3   Entered by:   Meredith Staggers, RN   Authorized by:   Talitha Givens, MD, [facility]   Signed by:   Meredith Staggers, RN on 05/13/2010   Method used:   Electronically to        [facility]. [phone]* (retail)       [address]       [place], [place]  [address]       Ph: [phone]       Fax: [phone]   RxID:   [id number] LIPITOR 40 MG  TABS (ATORVASTATIN CALCIUM) 1/2 tablet  by mouth once daily  #90 x 3   Entered by:   Meredith Staggers, RN   Authorized by:   Talitha Givens, MD, [facility]   Signed by:   Meredith Staggers, RN on 05/13/2010   Method used:   Electronically to        [facility]. [phone]* (retail)       [address]       [place], [place]  [address]        Ph: [phone]       Fax: [phone]   RxID:   [id number]

## 2010-10-21 NOTE — Miscellaneous (Signed)
Summary: Orders Update   Clinical Lists Changes  Medications: Rx of BENAZEPRIL HCL 20 MG TABS (BENAZEPRIL HCL) Take 1&1/2 tab once daily;  #90 x 1;  Signed;  Entered by: Marga Melnick MD;  Authorized by: Marga Melnick MD;  Method used: Print then Give to Patient Orders: Added new Test order of TLB-Albumin ([id number]-ALB) - Signed    Prescriptions: BENAZEPRIL HCL 20 MG TABS (BENAZEPRIL HCL) Take 1&1/2 tab once daily  #90 x 1   Entered and Authorized by:   Marga Melnick MD   Signed by:   Marga Melnick MD on 03/31/2010   Method used:   Print then Give to Patient   RxID:   [id number]

## 2010-10-21 NOTE — Assessment & Plan Note (Signed)
Summary: Cardiology nuclear Study  Nuclear Med Background Indications for Stress Test: Evaluation for Ischemia, Surgical Clearance, Stent Patency  Indications Comments: Pending  (R) hip surgery on 01/01/10 by Dr. Ollen Gross  History: Abnormal EKG, Echo, Heart Catheterization, Myocardial Perfusion Study, Stents  History Comments: 11/06 MPS: ischemia inferiorly, EF=68% >  Cath:VF shocks x2 >Stents-CFX, RCA; 6/08 Echo: EF= 60%  Symptoms: DOE    Nuclear Pre-Procedure Cardiac Risk Factors: Family History - CAD, History of Smoking, Hypertension, Lipids Caffeine/Decaff Intake: None NPO After: 10:00 PM Lungs: Clear IV 0.9% NS with Angio Cath: 20g     IV Site: (R) AC IV Started by: Stanton Kidney EMT-P Chest Size (in) 43     Height (in): 67 Weight (lb): 191 BMI: 30.02 Tech Comments: Metoprolol & Triam/HCTZ held > 24 hours, per Pt.  Nuclear Med Study 1 or 2 day study:  1 day     Stress Test Type:  Eugenie Birks Reading MD:  Charlton Haws, MD     Referring MD:  Willa Rough, MD; cc: Ollen Gross, MD Resting Radionuclide:  Technetium 61m Tetrofosmin     Resting Radionuclide Dose:  11.0 mCi  Stress Radionuclide:  Technetium 63m Tetrofosmin     Stress Radionuclide Dose:  33.0 mCi   Stress Protocol   Lexiscan: 0.4 mg   Stress Test Technologist:  Rea College CMA-N     Nuclear Technologist:  Burna Mortimer Deal RT-N  Rest Procedure  Myocardial perfusion imaging was performed at rest 45 minutes following the intravenous administration of Myoview Technetium 33m Tetrofosmin.  Stress Procedure  The patient received IV Lexiscan 0.4 mg over 15-seconds.  Myoview injected at 30-seconds.  There were no significant changes with lexiscan, only occasional PVC's and PJC's.  Quantitative spect images were obtained after a 45 minute delay.  QPS Raw Data Images:  Normal; no motion artifact; normal heart/lung ratio. Stress Images:  NI: Uniform and normal uptake of tracer in all myocardial segments. Rest Images:   Normal homogeneous uptake in all areas of the myocardium. Subtraction (SDS):  Normal Transient Ischemic Dilatation:  1.09  (Normal <1.22)  Lung/Heart Ratio:  .36  (Normal <0.45)  Quantitative Gated Spect Images QGS EDV:  81 ml QGS ESV:  24 ml QGS EF:  70 % QGS cine images:  normal  Findings Normal nuclear study      Overall Impression  Exercise Capacity: Lexiscan BP Response: Normal blood pressure response. Clinical Symptoms: SOB ECG Impression: No significant ST segment change suggestive of ischemia. Overall Impression: Normal stress nuclear study. Overall Impression Comments: Normal  Appended Document: Cardiology nuclear Study cleared for surgery.  Appended Document: Cardiology nuclear Study pt aware, note faxed to Dr Lequita Halt

## 2010-10-21 NOTE — Progress Notes (Signed)
Summary: reaction to med  Phone Note Call from Patient Call back at Home Phone [phone]   Caller: Patient Summary of Call: pt states that he is unable to tolerate Amlodipine 5 mg daily due to it causing his feet to swell. Pt started med 5 days ago....................Marland KitchenFelecia Deloach CMA  November 20, 2009 9:06 AM   Follow-up for Phone Call        D/C Amlodipine . Increase Benazepril to 1&1/2 once daily (30 mg). BP goal = average < 135/85. Follow-up by: Marga Melnick MD,  November 20, 2009 1:33 PM  Additional Follow-up for Phone Call Additional follow up Details #1::        pt aware med updated................Marland KitchenFelecia Deloach CMA  November 20, 2009 2:13 PM    New Allergies: ! AMLODIPINE BESYLATE (AMLODIPINE BESYLATE) New/Updated Medications: BENAZEPRIL HCL 20 MG TABS (BENAZEPRIL HCL) Take 1&1/2 tab once daily New Allergies: ! AMLODIPINE BESYLATE (AMLODIPINE BESYLATE)

## 2010-10-21 NOTE — Assessment & Plan Note (Signed)
Summary: cpx/ns/kdc   Vital Signs:  Patient profile:   75 year old male Height:      67 inches Weight:      190.6 pounds BMI:     29.96 Temp:     99.2 degrees F oral Pulse rate:   60 / minute Resp:     16 per minute BP sitting:   140 / 78  (left arm) Cuff size:   large  Vitals Entered By: Shonna Chock (November 11, 2009 2:29 PM) CC: CPX-Heart follow-up completed by Dr.Katz. Refill meds , Hypertension Management Comments REVIEWED MED LIST, PATIENT AGREED DOSE AND INSTRUCTION CORRECT    Primary Care Provider:  Dr Alwyn Ren  CC:  CPX-Heart follow-up completed by Dr.Katz. Refill meds  and Hypertension Management.  History of Present Illness: [name] is here for review of labs, risk assessment  & med refills. Dr Henrietta Hoover 10/23/2009 OV reviewed; Normal Sress Test 10/30/2009.  Hypertension History:      He denies headache, chest pain, palpitations, dyspnea with exertion, orthopnea, PND, peripheral edema, visual symptoms, neurologic problems, syncope, and side effects from treatment.  He notes no problems with any antihypertensive medication side effects.  Further comments include: BP @ home 110/60-65.        Positive major cardiovascular risk factors include male age 38 years old or older, hyperlipidemia, and hypertension.  Negative major cardiovascular risk factors include no history of diabetes, negative family history for ischemic heart disease, and non-tobacco-user status.        Positive history for target organ damage include ASHD (either angina/prior MI/prior CABG).  Further assessment for target organ damage reveals no history of stroke/TIA or peripheral vascular disease.     Allergies: 1)  ! Codeine  Past History:  Past Medical History: Hypertension Hypothyroidism Hyperlipidemia Benign prostatic hypertrophy Click murmur,PMH of Colonic polyps, hx of Skin cancer, hx of, melanoma, Dr Suan Halter CAD, DES, Circumflex  and RCA  2006 Venous insufficiency, PMH of     Past  Surgical History: Total hip replacement 2007, Dr Trudee Grip Tonsillectomy Toe Surgery [phone]) PTCA/stent 2006 Knee Surgery Colon polypectomy 10/2007 , hyperplastic polyp , Dr Arlyce Dice, repeat 2014  Family History: Father: Colon Cancer, HTN, pancreatitis Mother: carotid art disease Siblings: sister-DM; sister MI in mid 52s MGF:  DM, MI in 69s  Social History: Retired Married Former Smoker, quit in 1986 Alcohol use-yes , socially  only Regular exercise-yes: [employer] 30 min 5X/wee  Review of Systems General:  Denies fatigue, sleep disorder, and weight loss. Eyes:  Denies blurring, double vision, and vision loss-both eyes. ENT:  Denies difficulty swallowing and hoarseness. CV:  Denies leg cramps with exertion. Resp:  Denies cough and sputum productive. GI:  Denies abdominal pain, bloody stools, dark tarry stools, and indigestion. GU:  Denies discharge, dysuria, and hematuria. MS:  Complains of joint pain; denies joint redness and joint swelling; Dr Despina Hick will do [facility] 01/01/2010. Derm:  Denies changes in nail beds, dryness, lesion(s), and rash. Neuro:  Denies disturbances in coordination, numbness, poor balance, and tingling. Psych:  Denies anxiety and depression. Endo:  Denies cold intolerance, excessive hunger, excessive thirst, excessive urination, and heat intolerance. Heme:  Denies abnormal bruising and bleeding. Allergy:  Denies hives or rash, itching eyes, and sneezing; No angioedema.  Physical Exam  General:  well-nourished; alert,appropriate and cooperative throughout examination Neck:  No deformities, masses, or tenderness noted. Lungs:  Normal respiratory effort, chest expands symmetrically. Lungs are clear to auscultation, no crackles or wheezes. Heart:  regular rhythm, no murmur, no gallop, no rub, no JVD, no HJR, and bradycardia.  Report BP 130/78 Abdomen:  Bowel sounds positive,abdomen soft and non-tender without masses, organomegaly or hernias noted. Pulses:   R and L carotid,radial,dorsalis pedis and posterior tibial pulses are full and equal bilaterally Extremities:  No clubbing, cyanosis, edema, or deformity noted with normal full range of motion of all joints.   Neurologic:  alert & oriented X3 and DTRs symmetrical and normal.   Skin:  Intact without suspicious lesions or rashes Cervical Nodes:  No lymphadenopathy noted Axillary Nodes:  No palpable lymphadenopathy Psych:  memory intact for recent and remote, normally interactive, and good eye contact.     Impression & Recommendations:  Problem # 1:  HYPERTENSION, ESSENTIAL NOS (ICD-401.9) Controlled The following medications were removed from the medication list:    Triamterene-hctz 37.5-25 Mg Tabs (Triamterene-hctz) .Marland Kitchen... 1 by mouth qd His updated medication list for this problem includes:    Benazepril Hcl 20 Mg Tabs (Benazepril hcl) .Marland Kitchen... 1 by mouth qd    Metoprolol Succinate 25 Mg Tb24 (Metoprolol succinate) .Marland Kitchen... 1 by mouth qd    Diltiazem Hcl Er Beads 180 Mg Cp24 (Diltiazem hcl er beads) .Marland Kitchen... 1 by mouth once daily    Amlodipine Besylate 5 Mg Tabs (Amlodipine besylate) .Marland Kitchen... 1 once daily  Problem # 2:  HYPOKALEMIA (ICD-276.8) on Triam/ HCTZ  Problem # 3:  HYPERLIPIDEMIA (ICD-272.2) Lipids @ goal His updated medication list for this problem includes:    Lipitor 40 Mg Tabs (Atorvastatin calcium) .Marland Kitchen... 1/2 tablet  by mouth once daily  Problem # 4:  HYPOTHYROIDISM (ICD-244.9) TSH therapeutic His updated medication list for this problem includes:    Levothroid 150 Mcg Tabs (Levothyroxine sodium) .Marland Kitchen... 1 by mouth once daily except  1&1/2 every weds  Complete Medication List: 1)  Benazepril Hcl 20 Mg Tabs (Benazepril hcl) .Marland Kitchen.. 1 by mouth qd 2)  Levothroid 150 Mcg Tabs (Levothyroxine sodium) .Marland Kitchen.. 1 by mouth once daily except  1&1/2 every weds 3)  Metoprolol Succinate 25 Mg Tb24 (Metoprolol succinate) .Marland Kitchen.. 1 by mouth qd 4)  Cvs Aspirin 325 Mg Tabs (Aspirin) .... Take 1 tablet by  mouth daily as directed 5)  Vitamin C 500 Mg Chew (Ascorbic acid) .Marland Kitchen.. 1-2 tab once daily 6)  Lipitor 40 Mg Tabs (Atorvastatin calcium) .... 1/2 tablet  by mouth once daily 7)  Diltiazem Hcl Er Beads 180 Mg Cp24 (Diltiazem hcl er beads) .Marland Kitchen.. 1 by mouth once daily 8)  Amoxicillin 500 Mg Caps (Amoxicillin) .... 4 caps 1 hr prior to dental visit 9)  Nitroglycerin 0.4 Mg Subl (Nitroglycerin) .... One tablet under tongue every 5 minutes as needed for chest pain---may repeat times three 10)  Fluticasone Propionate 50 Mcg/act Susp (Fluticasone propionate) .Marland Kitchen.. 1spray once daily - two times a day as needed 11)  Flax Seeds Powd (Flaxseed (linseed)) .... Use as directed 12)  Amlodipine Besylate 5 Mg Tabs (Amlodipine besylate) .Marland Kitchen.. 1 once daily  Hypertension Assessment/Plan:      The patient's hypertensive risk group is category C: Target organ damage and/or diabetes.  Today's blood pressure is 140/78.    Patient Instructions: 1)  Stop Triam/HCTZ due to low K+; add Amlodipine 5 mg daily.  2)  Check your Blood Pressure regularly. If it is above: 135/85 ON AVERAGE you should make an appointment. Prescriptions: AMLODIPINE BESYLATE 5 MG TABS (AMLODIPINE BESYLATE) 1 once daily  #30 x 5   Entered and Authorized by:   Marga Melnick  MD   Signed by:   Marga Melnick MD on 11/11/2009   Method used:   Faxed to ...       [facility] [phone]* (retail)       [address]       [place], [place]  [address]       Ph: [phone]       Fax: [phone]   RxID:   [id number] METOPROLOL SUCCINATE 25 MG  TB24 (METOPROLOL SUCCINATE) 1 by mouth qd  #90.0 Each x 3   Entered and Authorized by:   Marga Melnick MD   Signed by:   Marga Melnick MD on 11/11/2009   Method used:   Faxed to ...       [facility] [phone]* (retail)       [address]       [place], [place]  [address]       Ph: [phone]       Fax: [phone]   RxID:    [id number] LEVOTHROID 150 MCG TABS (LEVOTHYROXINE SODIUM) 1 by mouth once daily except  1&1/2 every Weds  #90.0 Each x 3   Entered and Authorized by:   Marga Melnick MD   Signed by:   Marga Melnick MD on 11/11/2009   Method used:   Faxed to ...       [facility] [phone]* (retail)       [address]       [place], [place]  [address]       Ph: [phone]       Fax: [phone]   RxID:   [id number] BENAZEPRIL HCL 20 MG TABS (BENAZEPRIL HCL) 1 by mouth qd  #90.0 Each x 3   Entered and Authorized by:   Marga Melnick MD   Signed by:   Marga Melnick MD on 11/11/2009   Method used:   Faxed to ...       [facility] [phone]* (retail)       [address]       [place], [place]  [address]       Ph: [phone]       Fax: [phone]   RxID:   [id number]

## 2010-10-21 NOTE — Assessment & Plan Note (Signed)
Summary: swelling in feet//fd   Vital Signs:  Patient profile:   75 year old male Weight:      195 pounds Pulse rate:   66 / minute Resp:     15 per minute BP sitting:   130 / 82  (left arm)  Vitals Entered By: Doristine Devoid (November 26, 2009 3:08 PM) CC: swellen in both feet x1wk since change in med   Primary Care Provider:  Dr Alwyn Ren  CC:  swellen in both feet x1wk since change in med.  History of Present Illness: Edema better , 30-40% , off Amlodipine. Labs reviewed & risks discussed. Creat 1.0 & K+  3.4 on Triam/ HCTZ; this was D/C ed & Amlodipine started. BP @ home well controlled off CCB.  Allergies: 1)  ! Codeine 2)  ! Amlodipine Besylate (Amlodipine Besylate)  Review of Systems CV:  Complains of swelling of feet; denies difficulty breathing at night, difficulty breathing while lying down, shortness of breath with exertion, and swelling of hands.  Physical Exam  General:  well-nourished, alert,appropriate and cooperative throughout examination Lungs:  Normal respiratory effort, chest expands symmetrically. Lungs are clear to auscultation, no crackles or wheezes. Heart:  regular rhythm, bradycardia, and grade 1/2  /6 systolic murmur LSB.   Pulses:  R and L carotid,radial,dorsalis pedis and posterior tibial pulses are full and equal bilaterally Extremities:  trace left pedal edema and trace right pedal edema.     Impression & Recommendations:  Problem # 1:  HYPOKALEMIA (ICD-276.8) from HCTZ in Triam/ HCTZ; risk of dysrrhythmias discussed  Problem # 2:  EDEMA (ICD-782.3)  His updated medication list for this problem includes:    Spironolactone 25 Mg Tabs (Spironolactone) .Marland Kitchen... 1 once daily  Complete Medication List: 1)  Benazepril Hcl 20 Mg Tabs (Benazepril hcl) .... Take 1&1/2 tab once daily 2)  Levothroid 150 Mcg Tabs (Levothyroxine sodium) .Marland Kitchen.. 1 by mouth once daily except  1&1/2 every weds 3)  Metoprolol Succinate 25 Mg Tb24 (Metoprolol succinate) .Marland Kitchen.. 1 by  mouth qd 4)  Cvs Aspirin 325 Mg Tabs (Aspirin) .... Take 1 tablet by mouth daily as directed 5)  Vitamin C 500 Mg Chew (Ascorbic acid) .Marland Kitchen.. 1-2 tab once daily 6)  Lipitor 40 Mg Tabs (Atorvastatin calcium) .... 1/2 tablet  by mouth once daily 7)  Diltiazem Hcl Er Beads 180 Mg Cp24 (Diltiazem hcl er beads) .Marland Kitchen.. 1 by mouth once daily 8)  Amoxicillin 500 Mg Caps (Amoxicillin) .... 4 caps 1 hr prior to dental visit 9)  Nitroglycerin 0.4 Mg Subl (Nitroglycerin) .... One tablet under tongue every 5 minutes as needed for chest pain---may repeat times three 10)  Fluticasone Propionate 50 Mcg/act Susp (Fluticasone propionate) .Marland Kitchen.. 1spray once daily - two times a day as needed 11)  Flax Seeds Powd (Flaxseed (linseed)) .... Use as directed 12)  Spironolactone 25 Mg Tabs (Spironolactone) .Marland Kitchen.. 1 once daily  Patient Instructions: 1)  Limit your Sodium (Salt) to less than 4 grams a day (slightly less than 1 teaspoon) to prevent fluid retention, swelling, or worsening or symptoms. Prescriptions: SPIRONOLACTONE 25 MG TABS (SPIRONOLACTONE) 1 once daily  #30 x 5   Entered and Authorized by:   Marga Melnick MD   Signed by:   Marga Melnick MD on 11/26/2009   Method used:   Faxed to ...       [facility] [phone]* (retail)       [phone]  [place], [place]  [address]       Ph: [phone]       Fax: [phone]   RxID:   [id number]

## 2010-10-21 NOTE — Progress Notes (Signed)
Summary: pt request call    Phone Note Call from Patient Call back at Home Phone [phone]   Caller: Patient Summary of Call: Pt request call Initial call taken by: Judie Grieve,  March 10, 2010 10:20 AM  Follow-up for Phone Call        spoke w/pt Follow-up by: Meredith Staggers, RN,  March 10, 2010 10:54 AM

## 2010-10-21 NOTE — Assessment & Plan Note (Signed)
Summary: flu shot/cbs   Nurse Visit  CC: Flu shot/kb   Allergies: 1)  ! Codeine 2)  ! Amlodipine Besylate (Amlodipine Besylate)  Orders Added: 1)  Flu Vaccine 68yrs + MEDICARE PATIENTS [Q2039] 2)  Administration Flu vaccine - MCR [G0008]       Flu Vaccine Consent Questions     Do you have a history of severe allergic reactions to this vaccine? no    Any prior history of allergic reactions to egg and/or gelatin? no    Do you have a sensitivity to the preservative Thimersol? no    Do you have a past history of Guillan-Barre Syndrome? no    Do you currently have an acute febrile illness? no    Have you ever had a severe reaction to latex? no    Vaccine information given and explained to patient? yes    Are you currently pregnant? no    Lot Number:[id number]   Exp Date:03/21/2011   Site Given  Left Deltoid IMu

## 2010-10-21 NOTE — Letter (Signed)
Summary: Results Follow up Letter  [facility]  [address] [place], [place] [address]   Phone: [phone]  Fax: [phone]    11/14/2009 MRN: [id number]  [facility] [address] [place], [place]  [address]  Dear [name],  The following are the results of your recent test(s):  Test         Result    Pap Smear:        Normal _____  Not Normal _____ Comments: ______________________________________________________ Cholesterol: LDL(Bad cholesterol):         Your goal is less than:         HDL (Good cholesterol):       Your goal is more than: Comments:  ______________________________________________________ Mammogram:        Normal _____  Not Normal _____ Comments:  ___________________________________________________________________ Hemoccult:        Normal _X____  Not normal _______ Comments:    _____________________________________________________________________ Other Tests:    We routinely do not discuss normal results over the telephone.  If you desire a copy of the results, or you have any questions about this information we can discuss them at your next office visit.   Sincerely,

## 2010-10-21 NOTE — Assessment & Plan Note (Signed)
Summary: leg swollen/cbs   Vital Signs:  Patient profile:   75 year old male Height:      67.75 inches Weight:      194.8 pounds BMI:     29.95 Temp:     98.6 degrees F oral Pulse rate:   64 / minute Resp:     14 per minute BP sitting:   126 / 78  (left arm) Cuff size:   large  Vitals Entered By: Shonna Chock (March 31, 2010 12:12 PM) CC: Left leg swelling -no pain, Hypertension Management Comments REVIEWED MED LIST, PATIENT AGREED DOSE AND INSTRUCTION CORRECT    Primary Care Provider:  Dr Alwyn Ren  CC:  Left leg swelling -no pain and Hypertension Management.  History of Present Illness: Edema  noted by Dr Despina Hick 03/21/2010; he was unaware of it . He is not on  Amolidipine . Weight is stable.He restricts  salt in diet.  Hypertension History:      He denies headache, chest pain, palpitations, dyspnea with exertion, orthopnea, PND, visual symptoms, neurologic problems, and syncope.  BP @ home 120/56 ON AVERAGE.        Positive major cardiovascular risk factors include male age 46 years old or older, hyperlipidemia, and hypertension.  Negative major cardiovascular risk factors include no history of diabetes, negative family history for ischemic heart disease, and non-tobacco-user status.        Positive history for target organ damage include ASHD (either angina/prior MI/prior CABG).  Further assessment for target organ damage reveals no history of stroke/TIA or peripheral vascular disease.     Allergies: 1)  ! Codeine 2)  ! Amlodipine Besylate (Amlodipine Besylate)  Review of Systems Eyes:  Denies blurring, double vision, and vision loss-both eyes. CV:  Denies difficulty breathing at night, difficulty breathing while lying down, and leg cramps with exertion. Neuro:  Denies disturbances in coordination, numbness, poor balance, and tingling.  Physical Exam  General:  well-nourished,in no acute distress; alert,appropriate and cooperative throughout examination Lungs:  Normal  respiratory effort, chest expands symmetrically. Lungs are clear to auscultation, no crackles or wheezes. Heart:  bradycardia and irregular rhythm.   Abdomen:  Bowel sounds positive,abdomen soft and non-tender without masses, organomegaly or hernias noted. Pulses:  R and L carotid,radial,dorsalis pedis and posterior tibial pulses are full and equal bilaterally Extremities:  1/2+ left pedal edema and 1/2 + right pedal edema.   Neurologic:  alert & oriented X3.     Impression & Recommendations:  Problem # 1:  EDEMA (ICD-782.3)  His updated medication list for this problem includes:    Spironolactone 25 Mg Tabs (Spironolactone) .Marland Kitchen... 1 once daily  Orders: Venipuncture (84696) TLB-ALT (SGPT) (84460-ALT) TLB-AST (SGOT) (84450-SGOT) TLB-Creatinine, Blood (82565-CREA) TLB-Potassium (K+) (84132-K) TLB-BUN (Urea Nitrogen) (84520-BUN)  Problem # 2:  HYPERTENSION, ESSENTIAL NOS (ICD-401.9)  controlled His updated medication list for this problem includes:    Benazepril Hcl 20 Mg Tabs (Benazepril hcl) .Marland Kitchen... Take 1&1/2 tab once daily    Metoprolol Succinate 25 Mg Tb24 (Metoprolol succinate) .Marland Kitchen... 1 by mouth qd    Diltiazem Hcl Er Beads 180 Mg Cp24 (Diltiazem hcl er beads) .Marland Kitchen... 1 by mouth once daily    Spironolactone 25 Mg Tabs (Spironolactone) .Marland Kitchen... 1 once daily  Orders: Venipuncture (29528) TLB-Creatinine, Blood (82565-CREA) TLB-Potassium (K+) (84132-K) TLB-BUN (Urea Nitrogen) (84520-BUN)  Complete Medication List: 1)  Benazepril Hcl 20 Mg Tabs (Benazepril hcl) .... Take 1&1/2 tab once daily 2)  Levothroid 150 Mcg Tabs (Levothyroxine sodium) .Marland Kitchen.. 1 by  mouth once daily except  1&1/2 every weds 3)  Metoprolol Succinate 25 Mg Tb24 (Metoprolol succinate) .Marland Kitchen.. 1 by mouth qd 4)  Cvs Aspirin 325 Mg Tabs (Aspirin) .... Take 1 tablet by mouth daily as directed 5)  Vitamin C 500 Mg Chew (Ascorbic acid) .Marland Kitchen.. 1-2 tab once daily 6)  Lipitor 40 Mg Tabs (Atorvastatin calcium) .... 1/2 tablet  by  mouth once daily 7)  Diltiazem Hcl Er Beads 180 Mg Cp24 (Diltiazem hcl er beads) .Marland Kitchen.. 1 by mouth once daily 8)  Amoxicillin 500 Mg Caps (Amoxicillin) .... 4 caps 1 hr prior to dental visit 9)  Nitroglycerin 0.4 Mg Subl (Nitroglycerin) .... One tablet under tongue every 5 minutes as needed for chest pain---may repeat times three 10)  Fluticasone Propionate 50 Mcg/act Susp (Fluticasone propionate) .Marland Kitchen.. 1spray once daily - two times a day as needed 11)  Flax Seeds Powd (Flaxseed (linseed)) .... Use as directed 12)  Spironolactone 25 Mg Tabs (Spironolactone) .Marland Kitchen.. 1 once daily  Hypertension Assessment/Plan:      The patient's hypertensive risk group is category C: Target organ damage and/or diabetes.  Today's blood pressure is 126/78.    Patient Instructions: 1)  Limit your Sodium (Salt) to less than 4 grams a day (slightly less than 1 teaspoon) to prevent fluid retention, swelling, or worsening or symptoms. Use No Salt ; spices(Ex peppers)  or Mrs. Sharilyn Sites

## 2010-10-23 NOTE — Progress Notes (Signed)
Summary: Med alternate  Phone Note Call from Patient Call back at Home Phone [phone]   Summary of Call: Patient called noting that his Metoprolol will go from $5 up to $45. They suggested he change to:  Carvedilol, Metoprolo Tartrate, or Atenolol. Please send alternate to [facility] [address]. Pt is aware that our office will send alternate directly to the pharmacy. Please advise. Initial call taken by: Lucious Groves CMA,  September 11, 2010 4:09 PM  Follow-up for Phone Call        see Rx Follow-up by: Marga Melnick MD,  September 11, 2010 5:38 PM  Additional Follow-up for Phone Call Additional follow up Details #1::        RX faxed  Additional Follow-up by: Shonna Chock CMA,  September 12, 2010 8:15 AM    New/Updated Medications: METOPROLOL TARTRATE 25 MG TABS (METOPROLOL TARTRATE) 1/2 two times a day Prescriptions: METOPROLOL TARTRATE 25 MG TABS (METOPROLOL TARTRATE) 1/2 two times a day  #90 x 1   Entered and Authorized by:   Marga Melnick MD   Signed by:   Marga Melnick MD on 09/11/2010   Method used:   Print then Mail to Patient   RxID:   [id number]

## 2010-10-31 ENCOUNTER — Encounter: Payer: Self-pay | Admitting: Internal Medicine

## 2010-11-18 NOTE — Letter (Signed)
Summary: [facility]   Imported By: Maryln Gottron 11/11/2010 15:51:17  _____________________________________________________________________  External Attachment:    Type:   Image     Comment:   External Document

## 2010-12-09 LAB — PROTIME-INR
INR: 1.79 — ABNORMAL HIGH (ref 0.00–1.49)
INR: 2.21 — ABNORMAL HIGH (ref 0.00–1.49)
INR: 2.46 — ABNORMAL HIGH (ref 0.00–1.49)
Prothrombin Time: 20.6 seconds — ABNORMAL HIGH (ref 11.6–15.2)
Prothrombin Time: 20.9 seconds — ABNORMAL HIGH (ref 11.6–15.2)
Prothrombin Time: 24.3 seconds — ABNORMAL HIGH (ref 11.6–15.2)
Prothrombin Time: 26.5 seconds — ABNORMAL HIGH (ref 11.6–15.2)

## 2010-12-09 LAB — CBC
Hemoglobin: 12.4 g/dL — ABNORMAL LOW (ref 13.0–17.0)
MCHC: 32.7 g/dL (ref 30.0–36.0)
MCHC: 33.1 g/dL (ref 30.0–36.0)
MCHC: 33.6 g/dL (ref 30.0–36.0)
MCV: 95 fL (ref 78.0–100.0)
Platelets: 173 10*3/uL (ref 150–400)
RBC: 3.72 MIL/uL — ABNORMAL LOW (ref 4.22–5.81)
RBC: 3.95 MIL/uL — ABNORMAL LOW (ref 4.22–5.81)
RBC: 3.99 MIL/uL — ABNORMAL LOW (ref 4.22–5.81)
RDW: 13.9 % (ref 11.5–15.5)
WBC: 10.8 10*3/uL — ABNORMAL HIGH (ref 4.0–10.5)

## 2010-12-09 LAB — DIFFERENTIAL
Basophils Absolute: 0.1 10*3/uL (ref 0.0–0.1)
Basophils Relative: 1 % (ref 0–1)
Monocytes Relative: 11 % (ref 3–12)
Neutro Abs: 7.1 10*3/uL (ref 1.7–7.7)
Neutrophils Relative %: 71 % (ref 43–77)

## 2010-12-09 LAB — APTT: aPTT: 51 seconds — ABNORMAL HIGH (ref 24–37)

## 2010-12-09 LAB — BASIC METABOLIC PANEL
BUN: 14 mg/dL (ref 6–23)
BUN: 8 mg/dL (ref 6–23)
CO2: 26 mEq/L (ref 19–32)
Calcium: 8.4 mg/dL (ref 8.4–10.5)
Calcium: 8.4 mg/dL (ref 8.4–10.5)
Chloride: 102 mEq/L (ref 96–112)
Creatinine, Ser: 0.84 mg/dL (ref 0.4–1.5)
Creatinine, Ser: 1.1 mg/dL (ref 0.4–1.5)
GFR calc Af Amer: >60 mL/min (ref >60)
Glucose, Bld: 93 mg/dL (ref 70–99)

## 2010-12-09 LAB — URINALYSIS, ROUTINE W REFLEX MICROSCOPIC
Glucose, UA: NEGATIVE mg/dL
Hgb urine dipstick: NEGATIVE
Ketones, ur: NEGATIVE mg/dL
Protein, ur: NEGATIVE mg/dL
Urobilinogen, UA: 1 mg/dL (ref 0.0–1.0)

## 2010-12-10 LAB — URINALYSIS, ROUTINE W REFLEX MICROSCOPIC
Glucose, UA: NEGATIVE mg/dL
Hgb urine dipstick: NEGATIVE
Specific Gravity, Urine: 1.007 (ref 1.005–1.030)

## 2010-12-10 LAB — CBC
Hemoglobin: 12 g/dL — ABNORMAL LOW (ref 13.0–17.0)
Hemoglobin: 16.4 g/dL (ref 13.0–17.0)
MCHC: 34 g/dL (ref 30.0–36.0)
Platelets: 208 10*3/uL (ref 150–400)
RDW: 13.9 % (ref 11.5–15.5)
RDW: 14.3 % (ref 11.5–15.5)

## 2010-12-10 LAB — COMPREHENSIVE METABOLIC PANEL
ALT: 22 U/L (ref 0–53)
AST: 24 U/L (ref 0–37)
Albumin: 4.1 g/dL (ref 3.5–5.2)
Alkaline Phosphatase: 57 U/L (ref 39–117)
GFR calc Af Amer: >60 mL/min (ref >60)
Potassium: 4 mEq/L (ref 3.5–5.1)
Sodium: 141 mEq/L (ref 135–145)
Total Protein: 6.9 g/dL (ref 6.0–8.3)

## 2010-12-10 LAB — PROTIME-INR
INR: 1.27 (ref 0.00–1.49)
Prothrombin Time: 15.8 seconds — ABNORMAL HIGH (ref 11.6–15.2)

## 2010-12-10 LAB — BASIC METABOLIC PANEL
Calcium: 8.2 mg/dL — ABNORMAL LOW (ref 8.4–10.5)
Creatinine, Ser: 1.05 mg/dL (ref 0.4–1.5)
GFR calc Af Amer: >60 mL/min (ref >60)
GFR calc non Af Amer: >60 mL/min (ref >60)
Glucose, Bld: 115 mg/dL — ABNORMAL HIGH (ref 70–99)
Sodium: 138 mEq/L (ref 135–145)

## 2010-12-10 LAB — TYPE AND SCREEN
ABO/RH(D): O POS
Antibody Screen: NEGATIVE

## 2010-12-14 ENCOUNTER — Other Ambulatory Visit: Payer: Self-pay | Admitting: Internal Medicine

## 2010-12-15 NOTE — Telephone Encounter (Signed)
Patient needs to schedule a yearly CPX and labs to be done at the same time.

## 2010-12-19 ENCOUNTER — Encounter: Payer: Self-pay | Admitting: Cardiology

## 2010-12-20 ENCOUNTER — Encounter: Payer: Self-pay | Admitting: Cardiology

## 2010-12-21 ENCOUNTER — Encounter: Payer: Self-pay | Admitting: Cardiology

## 2010-12-21 DIAGNOSIS — E785 Hyperlipidemia, unspecified: Secondary | ICD-10-CM | POA: Insufficient documentation

## 2010-12-21 DIAGNOSIS — I872 Venous insufficiency (chronic) (peripheral): Secondary | ICD-10-CM | POA: Insufficient documentation

## 2010-12-21 DIAGNOSIS — I10 Essential (primary) hypertension: Secondary | ICD-10-CM | POA: Insufficient documentation

## 2010-12-21 DIAGNOSIS — E039 Hypothyroidism, unspecified: Secondary | ICD-10-CM | POA: Insufficient documentation

## 2010-12-21 DIAGNOSIS — I2510 Atherosclerotic heart disease of native coronary artery without angina pectoris: Secondary | ICD-10-CM | POA: Insufficient documentation

## 2010-12-22 ENCOUNTER — Ambulatory Visit (INDEPENDENT_AMBULATORY_CARE_PROVIDER_SITE_OTHER): Payer: MEDICARE | Admitting: Cardiology

## 2010-12-22 ENCOUNTER — Encounter: Payer: Self-pay | Admitting: Cardiology

## 2010-12-22 DIAGNOSIS — E785 Hyperlipidemia, unspecified: Secondary | ICD-10-CM

## 2010-12-22 DIAGNOSIS — I872 Venous insufficiency (chronic) (peripheral): Secondary | ICD-10-CM

## 2010-12-22 DIAGNOSIS — I10 Essential (primary) hypertension: Secondary | ICD-10-CM

## 2010-12-22 NOTE — Assessment & Plan Note (Signed)
Blood pressure is well controlled. No change in therapy. 

## 2010-12-22 NOTE — Assessment & Plan Note (Signed)
He does not have any significant edema at this time.  No change in therapy.

## 2010-12-22 NOTE — Progress Notes (Signed)
HPI The patient is seen for cardiology followup.  He is doing very well.  I saw him last August, 2011.  Since that time he's had another hip operation and has done well.  He does have some low back pain.  Is not having chest pain or shortness of breath.  He is fully active. Allergies  Allergen Reactions  . Amlodipine Besylate     REACTION: edema  . Codeine     Current Outpatient Prescriptions  Medication Sig Dispense Refill  . aspirin 325 MG tablet Take 325 mg by mouth daily.        Marland Kitchen atorvastatin (LIPITOR) 40 MG tablet Take 20 mg by mouth daily.       . benazepril (LOTENSIN) 20 MG tablet Take 30 mg by mouth daily.       Marland Kitchen diltiazem (TIAZAC) 180 MG 24 hr capsule Take 180 mg by mouth daily.        . fluticasone (FLOVENT DISKUS) 50 MCG/BLIST diskus inhaler Inhale 1 puff into the lungs 2 (two) times daily as needed.       Marland Kitchen LEVOTHROID 150 MCG tablet TAKE 1 TABLET BY MOUTH EVERY DAY EXCEPT 1 AND 1/2 TABLETS ON EVERY WEDNESDAY  90 tablet  0  . metoprolol tartrate (LOPRESSOR) 25 MG tablet Take 12.5 mg by mouth 2 (two) times daily.        . nitroGLYCERIN (NITROSTAT) 0.4 MG SL tablet Place 0.4 mg under the tongue every 5 (five) minutes as needed.          History   Social History  . Marital Status: Married    Spouse Name: N/A    Number of Children: N/A  . Years of Education: N/A   Occupational History  . retired    Social History Main Topics  . Smoking status: Former Smoker    Types: Cigarettes    Quit date: 09/21/1984  . Smokeless tobacco: Not on file  . Alcohol Use: Yes     socially  . Drug Use: No  . Sexually Active: Not on file   Other Topics Concern  . Not on file   Social History Narrative  . No narrative on file    Family History  Problem Relation Age of Onset  . Coronary artery disease Mother   . Colon cancer Father   . Hypertension Father   . Pancreatitis Father   . Diabetes Sister   . Diabetes Maternal Grandfather   . Heart attack Maternal Grandfather 70  .  Heart attack Sister 28    Past Medical History  Diagnosis Date  . Hypertension   . Hypothyroidism   . Hyperlipidemia   . BPH (benign prostatic hypertrophy)   . History of colonic polyps   . Skin cancer (melanoma)     hx  . Coronary artery disease     DES Circumflex or CVA 2006  /  clear, February, 2011, EF 70%, no ischemia or  . Venous insufficiency     Past Surgical History  Procedure Date  . Total hip arthroplasty 2007  . Tonsillectomy   . Toe surgery 1997  . Coronary angioplasty with stent placement 2006  . Colonoscopy w/ polypectomy     ROS  Patient denies fever, chills, headache, sweats, rash, change in vision, change in hearing, chest pain, cough, nausea vomiting, urinary symptoms.  All other systems are reviewed and are negative. PHYSICAL EXAM Patient is quite stable.  He is oriented to person time and place.  Affect is normal.  He is here with his wife.  There is no xanthelasma.  There are no carotid bruits.  Lungs are clear.  Respiratory effort is nonlabored.  Cardiac exam reveals S1 and S2.  No clicks or significant murmurs.  The abdomen is soft.  There is no peripheral edema. Filed Vitals:   12/22/10 1030  BP: 114/64  Pulse: 70  Weight: 192 lb (87.091 kg)    EKG  EKG is done today and reviewed by me.  There is mild sinus bradycardia and no other significant abnormality.  ASSESSMENT & PLAN

## 2010-12-22 NOTE — Assessment & Plan Note (Signed)
Lipids are treated. No change in therapy. 

## 2010-12-22 NOTE — Patient Instructions (Signed)
Your physician wants you to follow-up in: 1 year. You will receive a reminder letter in the mail two months in advance. If you don't receive a letter, please call our office to schedule the follow-up appointment.  

## 2011-02-03 NOTE — Assessment & Plan Note (Signed)
[facility]                            CARDIOLOGY OFFICE NOTE   NAME:[name] [name] RISINGER                       MRN:          [id number]  DATE:03/04/2007                            DOB:          11-27-1935    [name] [name] is seen for followup.  I had seen him on Jan 28, 2007.  He does  have peripheral edema.  He has cut back on some of his fluid intake and  his caffeine intake.  We did do a 2D echo to be sure that he has good LV  function.  The report is good.  The ejection fraction is 60%.  There is  aortic valve sclerosis.  There is no significant pulmonary hypertension.  His diastolic parameters do not appear to be abnormal.  Today, he is  seen back and he does have peripheral edema.  This occurs when he is up  during the day and disappears at night.  I do believe that this is due  to venous insufficiency.   PAST MEDICAL HISTORY:   ALLERGIES:  No known drug allergies.   MEDICATIONS:  1. Benazepril.  2. Triamterene/hydrochlorothiazide.  3. Caduet.  4. Synthroid.  5. Toprol.  6. Aspirin.  7. Vitamins.  8. Omega 3.   OTHER MEDICAL PROBLEMS:  See the list below.   REVIEW OF SYSTEMS:  He is doing pretty well.  He is not bothered by the  edema.  The patient's wife was with him today.  I answered multiple  questions concerning issues of types of fluid that he drinks, salt and  fluid intake and drinking fluids while he is exercising.   Otherwise, his review of systems is negative.   PHYSICAL EXAM:  Blood pressure is 130/65 with a pulse of 80.  Patient is  oriented to person, time and place.  Affect is normal.  HEENT:  Reveals no xanthelasma.  He has normal extraocular motion.  The  conjunctivae are normal.  There are no carotid bruits.  There is no  jugular venous distention.  LUNGS:  Clear.  CARDIAC EXAM:  Reveals an S1 with an S2.  There are no clicks or  significant murmurs.  His respiratory effort is appropriate.  ABDOMEN:  Soft.  He has normal  bowel sounds.  He does have 1 to 2+  peripheral edema bilaterally.   PROBLEMS INCLUDE:  1. Hypertension, treated.  2. Hyperlipidemia, treated.  3. Hypothyroidism, treated.  4. Status post knee surgery.  5. Status post hip surgery.  6. Coronary disease, stable.  7. Peripheral edema.  I believe this is due to venous insufficiency.      It is possible that Caduet may play a role here.  I have decided to      switch him from this to a medication that does not include Norvasc      to see how he feels.  This may affect his edema.     Luis Abed, MD, [facility]  Electronically Signed    JDK/MedQ  DD: 03/04/2007  DT: 03/04/2007  Job #: [id number]   cc:   Chrissie Noa  Minerva Ends, MD,FACP,FCCP

## 2011-02-03 NOTE — Assessment & Plan Note (Signed)
[facility]                            CARDIOLOGY OFFICE NOTE   NAME:[name] [name] RYNER                       MRN:          [id number]  DATE:11/02/2007                            DOB:          18-Jun-1936    [name] [name] is doing well.  He has coronary disease but he is quite  stable.  He saw Dr. Alwyn Ren recently.  His thyroid dose was adjusted.  He  has not had any chest pain, shortness of breath presyncope or syncope.   PAST MEDICAL HISTORY:   ALLERGIES:  No known drug allergies.   MEDICATIONS:  Benazepril 20, triamterene hydrochlorothiazide, thyroid,  metoprolol 25, aspirin 325, Lipitor 20, and Diltiazem 180.   OTHER MEDICAL PROBLEMS:  See the list below.   REVIEW OF SYSTEMS:  He is feeling well.  He wears support hose and that  helps with his mild peripheral edema.  He is doing well.  He has gained  weight and he does need to lose weight.  He needs to return to his  exercise program.   Otherwise his review of systems is negative.   PHYSICAL EXAM:  Blood pressure is 148/55 with a pulse of 55.  He checks  his blood pressure at home and his systolic is usually in the 130 range.  He is oriented to person, time and place and here with his wife today.  Affect is normal.  HEENT:  Reveals no xanthelasma.  He has normal extraocular motion.  There are no carotid bruits.  There is no jugular venous distention.  Lungs are clear.  Respiratory effort is not labored.  Cardiac exam reveals S1-S2.  There are no clicks or significant murmurs.  Abdomen is soft.  He has no masses or bruits.  He has no peripheral edema today.   EKG reveals sinus rhythm was sinus bradycardia.   PROBLEMS:  1. Hypertension.  2. Hyperlipidemia.  3. Hypothyroidism.  4. Status post knee surgery and hip surgery.  5. Coronary disease, stable.  6. Peripheral edema stable.   [name] [name] stable.  I will see him back in the year.     Luis Abed, MD, [facility]  Electronically  Signed    JDK/MedQ  DD: 11/02/2007  DT: 11/03/2007  Job #: [id number]   cc:   Titus Dubin. Alwyn Ren, MD,FACP,FCCP

## 2011-02-03 NOTE — Assessment & Plan Note (Signed)
[facility]                            CARDIOLOGY OFFICE NOTE   NAME:[name], [name]                       MRN:          [id number]  DATE:01/28/2007                            DOB:          06-Jun-1936    Mr. Bozzi is doing well he is not having any significant shortness of  breath. He does have some peripheral edema which comes on during the day  and disappears at night. He watches his salt intake. He does drink a lot  of extra fluid including a lot of extra water, and too much coffee with  caffeine, and he will cut back on both of these. Historically he has  good LV function. He has not had chest pain. He has had no syncope or  pre syncope.   PAST MEDICAL HISTORY:   ALLERGIES:  No known drug allergies.   MEDICATIONS:  1. Benazepril 20.  2. Triamterine/hydrochlorothiazide 37.5/25.  3. Caduet 20/10.  4. Synthroid 0.125 mg daily.  5. Toprol XL 25.  6. Aspirin 325.  7. Omega III fish oils.  8. Vitamins.   OTHER MEDICAL PROBLEMS:  See the list below.   REVIEW OF SYSTEMS:  He is doing and feeling well. He is not bothered by  the edema. Since I saw him last he has had a hip replacement and he is  doing well with this and he is getting more and more active.   Otherwise the review of systems is negative.   PHYSICAL EXAMINATION:  Blood pressure is 140/80, with a pulse of 72. The  patient is oriented to person, time, and place and his affect is normal.  He is here with his wife today.  LUNGS: Clear. Respiratory effort is not labored.  HEENT: Reveals no xanthelasma. He has normal extraocular motion. He has  no carotid bruits. There is no jugular venous distension.  CARDIAC EXAM: Reveals an S1 with an S2. There are no clicks or  significant murmurs.  ABDOMEN: Soft. He has no masses or bruits. He has normal bowel sounds.  The patient does have 1 to 2 + peripheral edema bilaterally.   EKG: Unchanged showing normal sinus rhythm.   PROBLEMS:   Include;  1. Hypertension, treated.  2. Hyperlipidemia, on medication that is included in Caduet.  3. Hypothyroidism, treated.  4. Status post knee surgery.  5. Status post hip surgery.  6. Coronary artery disease. The patient received a stent to his      circumflex and distal right coronary artery in the past. He has      been on Plavix of over 1 year and this was stopped successfully      before his hip surgery and  I chose not to restart it.  7. Peripheral edema, I believe this is most probably some mild venous      insufficiency along with excess fluid intake. He will drink his      fluids in moderation but no excess. He also will cut back on his      overall coffee intake. We will obtain a 2D echo to be sure  that his      left ventricular function remains normal. I will then see him back      for follow up.     Luis Abed, MD, [facility]  Electronically Signed    JDK/MedQ  DD: 01/28/2007  DT: 01/28/2007  Job #: [id number]   cc:   Ollen Gross, M.D.  Titus Dubin. Alwyn Ren, MD,FACP,FCCP

## 2011-02-03 NOTE — Assessment & Plan Note (Signed)
[facility]                            CARDIOLOGY OFFICE NOTE   NAME:Motsinger, LOPEZ DENTINGER                       MRN:          [id number]  DATE:10/23/2008                            DOB:          [birth year]    Mr. Alligood is doing very well.  He is exercising more than in the past.  He has lost 7 pounds.  He is here with his wife today.  He is not having  any chest pain.  He is fully active.  He has known coronary artery  disease.  He has had interventions in the past.  He received drug-  eluting stents in November 2006.  His Plavix had been eventually  discontinued.   PAST MEDICAL HISTORY:   ALLERGIES:  No known drug allergies.   MEDICATIONS:  See the flow sheet.   REVIEW OF SYSTEMS:  He has no GI or GU symptoms.  He has no fevers or  chills.  There are no skin rashes.  He has no headaches.  Otherwise, his  review of systems is negative.   PHYSICAL EXAMINATION:  VITAL SIGNS:  Blood pressure is 140/70 with a  pulse of 69.  GENERAL:  The patient is oriented to person, time, and place.  Affect is  normal.  HEENT:  No xanthelasma.  He has normal extraocular motion.  NECK:  There are no carotid bruits.  There is no jugular venous  distention.  LUNGS:  Clear.  Respiratory effort is not labored.  CARDIAC:  An S1 with an S2.  There are no clicks or significant murmurs.  ABDOMEN:  Soft.  EXTREMITIES:  There is no peripheral edema.   EKG reveals no change.   Mr. Ritzel is quite stable.  His coronary artery disease is stable.  He  does not need any testing at this time.   He asked me about meds that he could use for the cold and we have  recommended medicines that do not contain catecholamines.     Luis Abed, MD, [facility]  Electronically Signed    JDK/MedQ  DD: 10/23/2008  DT: 10/24/2008  Job #: [id number]

## 2011-02-03 NOTE — Assessment & Plan Note (Signed)
[facility]                            CARDIOLOGY OFFICE NOTE   NAME:[name], [name]                       MRN:          [id number]  DATE:04/28/2007                            DOB:          [birth year]    [name] is seen for followup.  I saw him last on March 04, 2007.  At  that time, we were concerned about peripheral edema.  I thought there  was a venous insufficiency component.  However, I thought that Norvasc  might also be playing a role.  We switched him off a Norvasc containing  medicine, and placed him on diltiazem, and he has done very well.  He  feels good, and his edema has decreased, and he is fully active.   PAST MEDICAL HISTORY:   ALLERGIES:  NO KNOWN DRUG ALLERGIES.   MEDICATIONS:  1. Benazepril 20.  2. Triamterene/hydrochlorothiazide.  3. Synthroid.  4. Toprol XL.  5. Aspirin.  6. Lipitor 20.  7. Diltiazem 180.   OTHER MEDICAL PROBLEMS:  See the list on the note of March 04, 2007.   REVIEW OF SYSTEMS:  He is doing well, and his review of systems is  negative.   PHYSICAL EXAMINATION:  Weight is 188 pounds.  Blood pressure is 150/85  with a pulse of 61.  Patient is oriented to person, time, and place.  Affect is normal.  HEENT:  Reveals no xanthelasma.  He has normal extraocular motion.  There are no carotid bruits.  There is no jugular venous distention.  LUNGS:  Clear.  Respiratory effort is not labored.  CARDIAC EXAM:  Reveals an S1 with an S2.  There are no clicks or  significant murmurs.  ABDOMEN:  Reveals normal bowel sounds.  He has trace peripheral edema.   PROBLEMS:  Listed on the note of March 04, 2007.  1. Hypertension.  His systolic pressure is slightly increased.  If      needed, his diltiazem dose can      be increased.  2. Peripheral edema.  This is improved off Norvasc.     Luis Abed, MD, [facility]  Electronically Signed    JDK/MedQ  DD: 04/28/2007  DT: 04/28/2007  Job #: [id number]   cc:   Titus Dubin.  Alwyn Ren, MD,FACP,FCCP

## 2011-02-06 NOTE — Cardiovascular Report (Signed)
[name], [name]                ACCOUNT NO.:  [id number]   MEDICAL RECORD NO.:  [id number]          PATIENT TYPE:  INP   LOCATION:  6529                         FACILITY:  [facility]   PHYSICIAN:  Salvadore Farber, M.D. LHCDATE OF BIRTH:  [birth year]   DATE OF PROCEDURE:  08/05/2005  DATE OF DISCHARGE:                              CARDIAC CATHETERIZATION   PROCEDURES:  1.  Drug-eluting stent placement in the mid-circumflex.  2.  Drug-eluting stent placement (x2) to the distal right coronary artery.   INDICATIONS:  [name] is a 75 year old gentleman without prior history of  cardiovascular disease.  He has minimal symptoms but underwent stress  testing at the request of Dr. Alwyn Ren.  This demonstrated a large area of  inferior ischemia.  That prompted cardiac catheterization performed  yesterday by Dr. Eden Emms.  He found an 80% focal stenosis of a large obtuse  marginal and a long 90% stenosis of the RCA.  He was then referred for  percutaneous coronary intervention.   PROCEDURAL TECHNIQUE:  Informed consent was obtained.  Under 1% lidocaine  local anesthesia, a 6-French sheath was placed in the right common femoral  artery using the modified Seldinger technique.  The patient had been  preloaded with 150 mg of Plavix yesterday and another 150 mg this morning.  I therefore administered 300 mg on the table.  Anticoagulation was initiated  with heparin and eptifibatide.  ACT was maintained at greater than 200  seconds throughout the case.   Attention was first turned to the circumflex.  A 6-French Voda left 3.5  guide was advanced over a wire and engaged in the ostium of the left main.  A Prowater wire was advanced to the distal obtuse marginal without  difficulty.  I attempted to directly stent using 2.5 by 16 mm Taxus, but it  would not cross the lesion.  I therefore predilated using a 2.5 x 12 mm  Maverick at 6 atmospheres.  I then was able to pass the stent across the  lesion  without difficulty.  I then deployed the 2.5 by 16 mm Taxus at 14  atmospheres.  I then postdilated the stent using a 2.5 x 15 mm PowerSail at  16 atmospheres.  Final angiography demonstrated no residual stenosis and  TIMI-3 flow to the distal vasculature.  There was no dissection.   Attention was then turned to the RCA.  The vessel was heavily calcified  throughout its course and there was a long 90% stenosis.  Due to the  calcification, I expected need for substantial backup.  I therefore advanced  am ART 3.5 guide over a wire, engaging the ostium of the RCA.  With the  guide engaged, there was some damping of pressures.  Therefore, throughout  the case the guide was engaged and withdrawn gently to allow perfusion.  A  Prowater wire was advanced without difficulty into the distal posterior left  ventricular branch.  I began by predilating using a 2.5 x 20 mm Maverick at  6 atmospheres.  I then attempted to pass a 2.5 x 24 mm Taxus across  the  lesion.  It would not cross an area of calcification in the midvessel  proximal to the segment which was to be treated.  I therefore advanced a  wiggle wire as a buddy into the distal PLV.  Over this wiggle wire, I was  able to advance the 2.5 x 24 mm Taxus without difficulty.  It was positioned  in the distal portion of the lesion and deployed at 14 atmospheres after  removal of the Prowater wire.  In order to position the stent, the guide was  engaged in the vessel and therefore damped for approximately two minutes.  Upon deployment of the stent, the patient developed ventricular  fibrillation.  The stent balloon was rapidly deflated and the patient  successfully cardioverted to sinus rhythm with two shocks each at 120  joules.  I then reintroduced the Prowater wire as a buddy and advanced a  2.75 x 28 mm Taxus so as to overlap the previously-placed stent by  approximately 3 mm.  It was deployed in the proximal portion of the lesion  at 14  atmospheres.  The stents were then postdilated using a 2.75 x 20 mm  Quantum distally at 18 atmospheres most distally and 20 atmospheres at the  region of overlap.  The more proximal stent was then postdilated using a 3.0  x 20 mm Quantum at 18 atmospheres in the region of overlap and 18  atmospheres more proximally.  Repeat angiography demonstrated no residual  stenosis in the treated segment, no dissection, and TIMI-3 flow to the  distal vasculature.  There remained approximately 50% stenosis proximal to  the stented segment and 50% stenosis at the ostium.  There was no evidence  of dissection at either of these.   The arteriotomy was then closed using a Star Close device.  Complete  hemostasis was obtained.   The the patient was awake and neurologically intact immediately after  cardioversion.  There were no hemodynamic sequelae.  He was then transferred  to the holding room in stable condition.   COMPLICATIONS:  Ventricular fibrillation, treated promptly and successfully  with two DC counter shocks.   IMPRESSION/RECOMMENDATIONS:  Successful percutaneous intervention on both  the circumflex and the right coronary artery using drug-eluting stents.  He  will be continued on aspirin and Plavix indefinitely.  Due to apparently a  fairly low threshold for ventricular fibrillation, beta blocker would be  increased.      Salvadore Farber, M.D. [facility]  Electronically Signed     WED/MEDQ  D:  08/05/2005  T:  08/05/2005  Job:  [id number]   cc:   Titus Dubin. Alwyn Ren, M.D. [facility]  [phone] [place]  [place] [address]   Willa Rough, M.D.  [address]  [address]  [place]  [place] [address]

## 2011-02-06 NOTE — Assessment & Plan Note (Signed)
[facility]                              CARDIOLOGY OFFICE NOTE   NAME:Daye, NIK GORRELL                       MRN:          [id number]  DATE:04/26/2006                            DOB:          Apr 09, 1936    Mr. Sanzone is doing very well.  He received drug-eluting stents in November  2006 and has done very well.  It is very important that he remain on Plavix  for at least 1 year until any type of elective surgery.  He needs hip  surgery, and he will be stable for this going forward but not until he has  passed at least 1 year on his Plavix.  I explained this to the patient and I  told him I would put it in a note to Dr. Lequita Halt.   The patient is not having any significant chest pain.  He is doing well.   PAST MEDICAL HISTORY:  Allergies:  No known drug allergies.   MEDICATIONS:  1.  Benazepril 20 mg.  2.  Triamterine/hydrochlorothiazide.  3.  Caduet.  4.  Synthroid.  5.  Plavix 75 mg.  6.  Toprol XL 25 mg.  7.  Aspirin 325 mg.  8.  Omega-3.  9.  Vitamins.   Other medical problems:  See the list below.   REVIEW OF SYSTEMS:  He feels and looks great.  His review of systems is  negative other than the hip discomfort that he has.   PHYSICAL EXAMINATION:  VITAL SIGNS:  Blood pressure is 140/72 with a pulse  of 67.  LUNGS:  Clear.  Respiratory effort is not labored.  HEENT:  No xanthelasmas.  He has normal extraocular motion.  NECK:  There are no carotid bruits.  There is no jugular venous distention.  CARDIAC:  An S1 with an S2.  There are no clicks or significant murmurs.  ABDOMEN:  Soft.  He has no significant peripheral edema.   His EKG reveals no significant change.   PROBLEM LIST:  1.  Hypertension.  2.  Hyperlipidemia.  3.  Hypothyroidism, treated.  4.  Status post knee surgery.  5.  Coronary disease, post several drug-eluting stents in November 2006.  6.  Need for hip surgery.   Mr. Frontera will be stable over time to undergo the  surgery.  However, at this  point he must remain on his Plavix for at least 1 year.  I will see him back  in late November and discuss the issues further with him.                               Luis Abed, MD, [facility]    JDK/MedQ  DD:  04/26/2006  DT:  04/26/2006  Job #:  [id number]   cc:   Titus Dubin. Alwyn Ren, MD, [facility]  Ollen Gross, MD

## 2011-02-06 NOTE — Cardiovascular Report (Signed)
NAMEJAYVIN, [name]                ACCOUNT NO.:  [id number]   MEDICAL RECORD NO.:  [id number]          PATIENT TYPE:  INP   LOCATION:  2032                         FACILITY:  [facility]   PHYSICIAN:  Charlton Haws, M.D.     DATE OF BIRTH:  June 14, 1936   DATE OF PROCEDURE:  08/04/2005  DATE OF DISCHARGE:                              CARDIAC CATHETERIZATION   Coronary arteriography.   INDICATIONS:  Markedly positive Myoview.   Cine catheterization was done with 4-French catheters from right femoral  artery.   Left main coronary artery was normal.   Left anterior descending artery was heavily calcified.   The proximal vessel had 30-40% tubular disease.  The mid vessel had 50%  tubular disease.  First diagonal branch had 40% ostial disease.   The circumflex coronary artery was nondominant.   The proximal circumflex coronary artery was also calcified.  There was 30%  tubular disease.  The mid vessel at the takeoff of a large obtuse marginal  branch had a 70-80% focal lesion.   The patient had left-to-right collaterals from the AV circumflex and septal  perforators to the distal right coronary artery.   The right coronary artery had 40-50% tubular disease proximally at the  ostium.  Mid vessel had 30% multidiscrete lesions.  The distal vessel prior  to the takeoff of the PDA had an 80-90% lesion with initial TIMI 2 flow.   RAO ventriculography showed normal EF 55%.  There is minimal inferior basal  wall hypokinesis with no MR.  Aortic pressure was 120/65, LV pressure is  120/12.   IMPRESSION:  The patient really has not had any significant chest pain  syndrome.  He had a markedly positive Myoview with inferior wall ischemic  changes.  I do not think the disease in the left anterior descending despite  its calcification is severe enough to warrant CABG.   The patient will be admitted for Plavix and heparin therapy.  He will have  two-vessel angioplasty of the obtuse marginal and  distal right coronary  artery in the morning.   This will hopefully be done by Dr. Samule Ohm.  He is off today but I will try  to look at the films with him in the morning.           ______________________________  Charlton Haws, M.D.     PN/MEDQ  D:  08/04/2005  T:  08/04/2005  Job:  [id number]   cc:   Willa Rough, M.D.  [address]  [address]  [place]  [place] [address]

## 2011-02-06 NOTE — Op Note (Signed)
NAMEROXY, FILLER                ACCOUNT NO.:  [id number]   MEDICAL RECORD NO.:  [id number]          PATIENT TYPE:  INP   LOCATION:  X004                         FACILITY:  [facility]   PHYSICIAN:  Ollen Gross, M.D.    DATE OF BIRTH:  06-Feb-1936   DATE OF PROCEDURE:  09/15/2006  DATE OF DISCHARGE:                               OPERATIVE REPORT   PREOPERATIVE DIAGNOSIS:  Osteoarthritis, left hip.   POSTOPERATIVE DIAGNOSIS:  Osteoarthritis, left hip.   PROCEDURE:  Left total hip arthroplasty.   SURGEON:  Dr. Lequita Halt   ASSISTANT:  Avel Peace, PA-C   ANESTHESIA:  General.   ESTIMATED BLOOD LOSS:  200.   DRAIN:  Hemovac x1.   COMPLICATIONS:  None.   CONDITION:  Stable to recovery.   BRIEF CLINICAL NOTE:  Mr. Chalfin is a 75 year old male, end-stage  arthritis of the left hip with progressively worsening pain.  He has  been cleared from a cardiac standpoint and presents now for total hip  arthroplasty.   PROCEDURE IN DETAIL:  After the successful administration of general  anesthetic, the patient was placed in the right lateral decubitus  position with the left side up and held with the hip positioner.  The  left lower extremity was isolated from his perineum with plastic drapes  and prepped and draped in the usual sterile fashion.  Short  posterolateral incision is made with a 10 blade through the subcutaneous  tissue to the level of the fascia lata which was incised in line with  the skin incision.  Sciatic nerve is palpated and protected and the  short rotators isolated off the femur.  Capsulectomy is performed, and  the hip is dislocated.  The center of the femoral head is marked and a  trial prosthesis placed such that the center of the trial head  corresponds to the center of his native femoral head.  Osteotomy line is  marked on the femoral neck and osteotomy made with an oscillating saw.  The femoral head is removed and then the femur retracted anteriorly to  gain  acetabular exposure.   Acetabular, labrum, and osteophytes were removed.  Reaming starts at 45  mm, coursing in increments of 2 up to 55 mm and then a 56 mm pinnacle  acetabular shell is placed in anatomic position and transfixed with 2  dome screws.  The permanent apex hole eliminator is placed, and the  permanent 40 mm neutral Ultramet liner is placed into the acetabular  shell.  This is a metal-on-metal hip replacement.   Femur is prepared with the canal finder and irrigation.  Axial reaming  is performed up to 13.5 mm, proximal reaming to 58F and the sleeve  machined to a large.  An 58F large trial sleeve is placed with an 18 x  13 stem and 36 plus 12 neck, matching his native anteversion.  A 40.0  head is placed.  We did go to a 40 plus 3 to get a little better soft  tissue tension.  With the 40 plus 3, we had great stability with full  extension,  full external rotation, 70 degrees flexion, 40 degrees  adduction, and 90 degrees internal rotation and 90 degrees of flexion  and 70 degrees of internal rotation.  By placing the left leg on top of  the right, I felt his other leg lengths were equal.   Femoral trials removed and then the permanent 68F large sleeve is  placed, the 18 x 13 stem, and the 36 plus 12 neck matching his native  anteversion.  The 40 plus 3 head is placed, and the hip is reduced with  the same stability parameters.  The wound is copiously irrigated with  saline solution and the short rotators reattached to the femur through  drill holes.  Fascia lata was closed over a Hemovac drain with  interrupted #1 Vicryl, subcu closed #1 and then #2-0 Vicryl and  subcuticular running 4-0 Monocryl.  The drain is hooked to suction,  incision cleaned and dried, and Steri-Strips and a bulky sterile  dressing applied.  He was then placed into a knee immobilizer, awakened,  and transported to recovery in stable condition.      Ollen Gross, M.D.  Electronically  Signed     FA/MEDQ  D:  09/15/2006  T:  09/15/2006  Job:  [id number]

## 2011-02-06 NOTE — Discharge Summary (Signed)
NAMECHEROKEE, CLOWERS                ACCOUNT NO.:  [id number]   MEDICAL RECORD NO.:  [id number]          PATIENT TYPE:  INP   LOCATION:  6529                         FACILITY:  [facility]   PHYSICIAN:  Doylene Canning. Ladona Ridgel, M.D.  DATE OF BIRTH:  Dec 18, 1935   DATE OF ADMISSION:  08/04/2005  DATE OF DISCHARGE:  08/06/2005                                 DISCHARGE SUMMARY   PRIMARY DIAGNOSIS:  Unstable anginal pain.   SECONDARY DIAGNOSIS:  1.  Hypertension.  2.  Hyperlipidemia.  3.  Hypothyroidism.  4.  Family history of coronary artery disease.  5.  Mild peripheral edema.  6.  Remote history of tobacco use.  7.  Status post knee surgery.   PROCEDURE:  1.  Cardiac catheterization.  2.  Coronary arteriogram.  3.  Left ventriculogram.  4.  Percutaneous transluminal coronary angiography and Taxus stent to two      vessels.  5.  Starclose to the right femoral artery.   ALLERGIES:  No known drug allergies.   Time of discharge 33 minutes.   HOSPITAL COURSE:  Mr. Mavis is a 75 year old male with no previous history  of coronary artery disease.  Dr. Alwyn Ren referred him for a stress Myoview  scan and he had EKG changes of greater than 2 mm ST depression that was  downsloping as well as inferior ischemia.  He was evaluated by Dr. Myrtis Ser and  referred for catheterization.  He was admitted for this on August 04, 2005.  On August 05, 2005, Mr. Saputo had a cardiac catheterization.  It  showed a tight distal RCA with collaterals and an 80% circumflex.  He was  referred for dual vessel PCI.  Dr. Samule Ohm performed percutaneous  intervention and Taxus stent to the circumflex/OM reducing that stenosis  from 80% to 0.  The RCA was treated with two Taxus stents reducing the  stenosis from 90% to 0.  He had VF during the procedure that was due to  guide damping.  He was immediately cardioverted and was neurologically  intact.  He is to be on aspirin and Plavix indefinitely and a beta blocker  was  added.   On August 06, 2005, Mr. Snarski was having no chest pain or shortness of  breath.  His follow up labs showed an elevation in the CK MB from 94/3.1 to  138/10.9 with a troponin of 0.37.  It was felt that this enzyme elevation  was secondary to the VF and cardioversion as his EKG had no new changes and  he was pain free.  Mr. Marte was evaluated by Dr. Ladona Ridgel and considered  stable for discharge with outpatient follow up arranged.   DISCHARGE INSTRUCTIONS:  His activity level is to include no driving for  three days and no lifting for two weeks.  He is to call the office for  problems with his cath site.  He is to stick to a low fat diet.  He is to  increase activities slowly.  He is to follow up with Dr. Myrtis Ser on November 29  at 10:15 and with Dr.  Hopper as needed.   DISCHARGE MEDICATIONS:  1.  Plavix 75 mg daily.  2.  Coated aspirin 325 mg daily.  3.  Nitroglycerin 0.4 mg p.r.n.  4.  Synthroid 0.125 mg daily.  5.  Lipitor 10 mg daily.  6.  Maxzide 37.5/25 mg daily.  7.  Toprol XL 25 mg daily.  8.  Norvasc 10 mg daily.      Theodore Demark, P.A. [facility]    ______________________________  Doylene Canning. Ladona Ridgel, M.D.    RB/MEDQ  D:  08/06/2005  T:  08/06/2005  Job:  [id number]   cc:   Willa Rough, M.D.  [address]  [address]  [place]  [place] [address]   Titus Dubin. Alwyn Ren, M.D. [facility]  [phone] [place]  [place] [address]

## 2011-02-06 NOTE — Discharge Summary (Signed)
[name], [name]                ACCOUNT NO.:  [id number]   MEDICAL RECORD NO.:  [id number]          PATIENT TYPE:  INP   LOCATION:  1504                         [facility]ACILITY:  [facility]   PHYSICIAN:  Ollen Gross, M.D.    DATE OF BIRTH:  01/12/1936   DATE OF ADMISSION:  09/15/2006  DATE OF DISCHARGE:  09/19/2006                               DISCHARGE SUMMARY   ADMISSION DIAGNOSES:  1. Osteoarthritis, left hip.  2. Hypertension.  3. Hyperlipidemia.  4. Hypothyroidism.  5. Coronary arterial disease.  6. Status post cardiac catheterization with stenting, 2 vessels, the      circumflex and the distal right coronary artery.   DISCHARGE DIAGNOSES:  1. Osteoarthritis, left hip, status post left total hip replacement      and arthroplasty.  2. Acute blood loss anemia, did not require transfusion.  3. Postoperative hyponatremia, improving.  4. Postoperative hypokalemia, improving.  5. Hypertension.  6. Hyperlipidemia.  7. Hypothyroidism.  8. Coronary arterial disease.  9. Status post cardiac catheterization with stenting, 2 vessels, the      circumflex and the distal right coronary artery.   PROCEDURE:  September 15, 2006, left total hip.   SURGEON:  Dr. Lequita Halt.   ASSISTANT:  Avel Peace, PAC.   ANESTHESIA:  General.   CONSULTS:  Internal Medicine.   BRIEF HISTORY:  [name] is a 75 year old male with a history of  osteoarthritis to the left hip with a progressive, worsening pain.  He  has been cleared from a cardiac standpoint and subsequently admitted for  procedure.   LABORATORY DATA:  Preoperative CBC showed a hemoglobin of 15.7 and a  hematocrit of 45.2.  Postoperative hemoglobin 11.5, 33.5.  Last noted H  and H 11.2 and 32.9.  Admission differential on the CBC showed  neutrophils of 84, lymphocytes 7, monocytes 8, eosinophils 1, basophil  0.  PT/PTT on admission 14.3 and 28, respectively.  INR 1.1.  Serial pro  times followed and last noted PT, INR 28.3 and 2.5.   Chemistry panel on  admission all within normal limits.  Serial BMETs are followed.  Sodium  did drop from 141 to 127, back up to 134.  Potassium dropped from 4 to  3, back up to 4.3.  Chloride dropped from 109 to 89, back up to 96.  Glucose went up 100, up to 130, back down to 114.  Hemoglobin A1c normal  at 5.6.  Preoperative UA negative.  Blood group type O-positive.  Followup UA cloudy, moderate hemoglobin, positive protein, a few  bacteria.   X-RAYS:  Two-view chest, September 03, 2006, no active cardiopulmonary  disease.  Left hip films preoperative, September 03, 2006, advanced OA of  the left hip with deformity of the left femoral head and acetabulum,  superolateral subluxation of the left femur suspicious of AVN of the  left femoral head.   HOSPITAL COURSE:  The patient was admitted to [facility],  tolerated procedure well, later transferred to the recovery room and the  orthopedic floor.  A medical consult was called.  The patient was seen  in consultation by internal medicine services per Dr. Alwyn Ren. Dr.  Posey Rea evaluated the patient, felt to be doing well postoperatively.  They followed along from a medical standpoint.  By day #1, the patient  was doing pretty well, been seen on rounds, did have some complaints of  soreness through the night but, overall, could tell a difference in his  pain.  He tried some lozenges for a sore throat.  Hemovac drain placed  at the time of surgery was pulled out without difficulty.  He had good  urinary output.  He had no complaints from a coronary standpoint.  He  was doing well from a medical standpoint.  Blood pressure was stable.  He started getting up with physical therapy.  By day #2, his pain was  under good control.  His sodium and potassium had dropped; therefore,  the fluids were discontinued.  He was placed on potassium supplements.  He did respond to this.  His pulse was noted to be somewhat rapid on day  #2 but, on  morning rounds, he was 88 and this had improved.  Monitor for  tachycardia, felt to be a pain component, recommended pain medications.  He was feeling okay, doing well.  He had a little bit of elevated  glucose's.  His hemoglobin A1c was checked which was okay.  By day #3,  he was up, ambulating very well, about 180 feet to 200 feet.  His BMET  had improved.  His sodium was back up and potassium was back up and  blood pressure was normal.  His UA was checked by medical services.  A  few bacteria, otherwise it was negative.  Continued to progress well  from physical therapy.  Felt to be stable.  He did well with physical  therapy and was ready to go home by postoperative day #4.   DISCHARGE PLAN:  1. The patient was discharged home on postoperative day #4 of September 19, 2006.  2. Discharge diagnosis, please see above.  3. Discharge medications:  He is on Percocet, Robaxin, Coumadin.   DIET:  Resume home diet, cardiac, low-cholesterol diet.   May shower.  Followup in 2 weeks.  Partial weightbearing 25% to 50% left  lower extremity.   DISPOSITION:  Home.   CONDITION ON DISCHARGE:  Improved.      [name], P.A.      Ollen Gross, M.D.  Electronically Signed    ALP/MEDQ  D:  10/07/2006  T:  10/07/2006  Job:  [id number]   cc:   Ollen Gross, M.D.  Fax: [phone]   Titus Dubin. Alwyn Ren, MD,FACP,FCCP  [phone] [place]  [place] [address]   Luis Abed, MD, [facility]  [address]  [address]  [place]  [place] [address]

## 2011-02-06 NOTE — H&P (Signed)
NAMEJAYSHAWN, COLSTON                ACCOUNT NO.:  [id number]   MEDICAL RECORD NO.:  [id number]          PATIENT TYPE:  INP   LOCATION:  NA                           [facility]ACILITY[facility]  [facility]   PHYSICIAN:  Ollen Gross, M.D.    DATE OF BIRTH:  [birth year]   DATE OF ADMISSION:  09/15/2006  DATE OF DISCHARGE:                              HISTORY & PHYSICAL   CHIEF COMPLAINT:  Left hip pain.   HISTORY OF PRESENT ILLNESS:  This is a 75 year old male who has been  seen by Dr. Lequita Halt earlier this year for ongoing left hip pain,  progressive for about a year now.  It has gotten worse over the past few  months.  This is mainly in the groin, going down the lateral leg.  It is  progressive in nature.  He has been seen in the office where hip films  show end-stage arthritic changes of the left hip with bone-on-bone and  slight superior migration of the head.  He was found to have end-stage  arthritis.  It was felt he would benefit from undergoing surgery.  Risks  and benefits discussed.  He has been seen by Dr. Myrtis Ser preoperatively who  felt that he is cleared for surgery.  He stopped Plavix preoperatively,  and we will try to get him back on aspirin as soon as possible.   ALLERGIES:  No known drug allergies.   CURRENT MEDICATIONS:  Benazepril, triamterene/hydrochlorothiazide,  Caduet, Levothyroxine, Plavix which has been stopped this past month,  Toprol XL, aspiring which has been stopped preoperatively, vitamin E,  vitamin C, metoprolol, and Tylenol.   PAST MEDICAL HISTORY:  1. Hypertension.  2. Hyperlipidemia.  3. Hypothyroidism.  4. Coronary arterial disease with stent placement in his circumflex      and distal right coronary artery.   PAST SURGICAL HISTORY:  1. Right knee surgery.  2. Right bunion surgery.  3. Cardiac catheterization with 2 stents November 2006.   SOCIAL HISTORY:  Married, retired, nonsmoker, 2 children.  Wife will be  assisting with care after surgery.   FAMILY  HISTORY:  Grandfather with diabetes, father with colon and pancreatic cancer.   REVIEW OF SYSTEMS:  GENERAL: No fever, chills, night sweats. NEUROLOGIC:  No seizures, syncope, paralysis. RESPIRATORY:  No shortness of breath,  productive cough, hemoptysis. CARDIOVASCULAR:  No chest pain, angina or  orthopnea.  GI: No nausea, vomiting, diarrhea, constipation.  GU: No  dysuria, hematuria, discharge.  MUSCULOSKELETAL: Left hip.   PHYSICAL EXAMINATION:  VITAL SIGNS:  Pulse 60, respirations 12, blood  pressure 142/86.  GENERAL: The patient is a 75 year old white male, well-nourished, well-  developed, in no acute distress.  He is alert, oriented, cooperative,  pleasant.  He is accompanied by his wife.  HEENT: Normocephalic and atraumatic. Pupils equal, round, and reactive.  Noted to wear glasses.  EOM intact.  NECK:  Supple.  No carotid bruits.  CHEST: Clear anteriorly.  No rhonchi, rales, or wheezing.  HEART: Regular rhythm.  S1, S2.  He does have an ectopic beat.  No rubs,  thrills, palpitations, or murmurs.  ABDOMEN: Soft, slightly round, nontender. Bowel sounds present.  RECTAL/BREASTS/GENITALIA:  Not done, not pertinent to present illness.  EXTREMITIES:  Left hip: The left hip shows flexion of only 90 degrees.  There is 0 internal and external rotation, 20 degrees of abduction.  Does ambulate with an antalgic gait.   IMPRESSION:  1. Osteoarthritis, left hip.  2. Hypertension.  3. Hyperlipidemia.  4. Hypothyroidism.  5. Coronary arterial disease.  6. Status post cardiac catheterization with stenting, two vessels,      circumflex and distal right coronary artery.   PLAN:  The patient will be admitted to [facility] to undergo a  left total hip arthroplasty.  Surgery will be performed by Dr. Ollen Gross.  His cardiologist is Dr. Myrtis Ser who will be notified of the room  number on admission and will be consulted if needed for assistance with  this patient during the  hospital course.      [name], P.A.      Ollen Gross, M.D.  Electronically Signed    ALP/MEDQ  D:  09/14/2006  T:  09/14/2006  Job:  [id number]   cc:   Titus Dubin. Alwyn Ren, MD,FACP,FCCP  [phone] [place]  [place] [address]   Luis Abed, MD, [facility]  [address]  [address]  [place]  [place] [address]

## 2011-02-06 NOTE — Assessment & Plan Note (Signed)
[facility]                              CARDIOLOGY OFFICE NOTE   NAME:Streng, MICO SPARK                       MRN:          [id number]  DATE:07/30/2006                            DOB:          1935-12-24    [name] is seen for cardiology followup.  He is also seen for final  clearance for surgery on his hip.  The patient received drug-eluting stents  electively in November of 2006.  He had significant shortness of breath and  an abnormal exercise study at that time.  He is doing very well.  He has not  had recurring shortness of breath.  He has not had any recurring chest pain.  I am very pleased with his overall progress.  He has significant problems  with his hip, and surgery is needed.  When I saw him in August, I made it  clear that he needed to be on is Plavix for a full year, and he has waited.  It is now 1 year.  The patient has not had palpitations.  He has had no  presyncope or syncope.  We know historically that he has normal LV function.   ALLERGIES:  NO KNOWN DRUG ALLERGIES.   MEDICATIONS:  1. Benazepril 20.  2. Triamterene.  3. Caduet 10/20.  4. Synthroid 0.125.  5. Plavix 75 (to be stopped today).  6. Toprol-XL 25.  7. Aspirin 325.  8. Omega-3's.   OTHER MEDICATION PROBLEMS:  See the list below.   REVIEW OF SYSTEMS:  His major difficulty is his hip discomfort.  Otherwise,  his review of systems is negative.   PHYSICAL EXAMINATION:  GENERAL:  The patient is well-developed and well-  nourished.  He is here with his wife today, and I have talked with both of  them.  VITAL SIGNS:  Blood pressure today is 150/80, pulse 66.  NEUROLOGIC:  The patient is oriented to person, time, and place.  Affect is  normal.  HEENT:  No xanthelasma.  He has normal extraocular motion.  NECK:  There are no carotid bruits, and there is no jugular venous  distension.  LUNGS:  Clear.  Respiratory effort is not labored.  CARDIAC:  S1 and S2.  There  are no clicks or significant murmurs.  ABDOMEN:  Soft.  There are no masses or bruits.  EXTREMITIES:  There is no peripheral edema.  The patient does walk with a  cane because of the pain in his hip.   His EKG shows no significant change.  There are PACs but no significant  abnormalities.   PROBLEMS:  1. Hypertension, treated.  2. Hyperlipidemia, on medication.  3. Hypothyroidism, treated.  4. Status post knee surgery.  5. Coronary artery disease.  The patient received stent placement to his      circumflex and distal right coronary artery.  He has now been on Plavix      for 1 year.  6. Current need for hip surgery.   I had a very long, careful, and complete discussion with the patient and his  wife.  The  recommendations are now to continue Plavix for 1 year after drug-  eluting stent, and this has been done.  I explained to the patient and his  wife that there was a very, very small risk that he could have a problem  with his stent when he comes off of Plavix.  It is my feeling that it is  most appropriate to stop the Plavix today to be sure that he is stable over  the next several weeks.  This will allow him to be off Plavix and stable and  ready to go ahead with his hip surgery.  He is cleared for hip surgery.  If  he has any difficulties over the next few weeks, he knows to be in touch  with [place] immediately.   He is cleared for hip surgery, with his Plavix stopped today.  If his  aspirin can be continued, I would like for him to remain on aspirin.  If it  cannot, it should be stopped for the shortest amount of time possible and  then restarted.    ______________________________  Luis Abed, MD, [facility]    JDK/MedQ  DD: 07/30/2006  DT: 07/30/2006  Job #: [id number]   cc:   Ollen Gross, M.D.  Titus Dubin. Alwyn Ren, MD,FACP,FCCP

## 2011-02-26 ENCOUNTER — Other Ambulatory Visit: Payer: Self-pay | Admitting: Internal Medicine

## 2011-02-27 NOTE — Telephone Encounter (Signed)
Patient needs to schedule CPX and fasting labs  

## 2011-03-15 ENCOUNTER — Other Ambulatory Visit: Payer: Self-pay | Admitting: Internal Medicine

## 2011-04-22 ENCOUNTER — Other Ambulatory Visit: Payer: Self-pay | Admitting: Internal Medicine

## 2011-05-05 ENCOUNTER — Other Ambulatory Visit: Payer: Self-pay | Admitting: Cardiology

## 2011-05-11 ENCOUNTER — Telehealth: Payer: Self-pay | Admitting: Cardiology

## 2011-05-11 NOTE — Telephone Encounter (Signed)
Pt calling re question on  medication °

## 2011-05-12 NOTE — Telephone Encounter (Signed)
Message routed to my desktop yesterday in error. I was not in the office. This should have gone to triage. I have attempted to call the patient this morning. No answer x 10 rings. I will forward to the triage desktop.

## 2011-05-12 NOTE — Telephone Encounter (Signed)
PT AWARE NO SAMPLES AVAILABLE AND LIPITOR IS GEN .SO  WE WONT GET ANYMORE SAMPLES  PT HAS SCRIPT  DOES NOT NEED PRESCRIPTION

## 2011-05-17 ENCOUNTER — Other Ambulatory Visit: Payer: Self-pay | Admitting: Internal Medicine

## 2011-05-17 ENCOUNTER — Other Ambulatory Visit: Payer: Self-pay | Admitting: Cardiology

## 2011-05-18 NOTE — Telephone Encounter (Signed)
TSH 244.9 DUE 

## 2011-06-24 ENCOUNTER — Other Ambulatory Visit: Payer: Self-pay | Admitting: Internal Medicine

## 2011-06-25 NOTE — Telephone Encounter (Signed)
TSH 244.9 

## 2011-07-21 ENCOUNTER — Other Ambulatory Visit: Payer: Self-pay | Admitting: Internal Medicine

## 2011-08-02 ENCOUNTER — Other Ambulatory Visit: Payer: Self-pay | Admitting: Internal Medicine

## 2011-08-12 ENCOUNTER — Ambulatory Visit (INDEPENDENT_AMBULATORY_CARE_PROVIDER_SITE_OTHER): Payer: Medicare Other

## 2011-08-12 DIAGNOSIS — Z23 Encounter for immunization: Secondary | ICD-10-CM

## 2011-09-23 ENCOUNTER — Ambulatory Visit (INDEPENDENT_AMBULATORY_CARE_PROVIDER_SITE_OTHER): Payer: Medicare Other | Admitting: Internal Medicine

## 2011-09-23 ENCOUNTER — Encounter: Payer: Self-pay | Admitting: Internal Medicine

## 2011-09-23 DIAGNOSIS — Z87898 Personal history of other specified conditions: Secondary | ICD-10-CM

## 2011-09-23 DIAGNOSIS — E039 Hypothyroidism, unspecified: Secondary | ICD-10-CM

## 2011-09-23 DIAGNOSIS — I10 Essential (primary) hypertension: Secondary | ICD-10-CM

## 2011-09-23 DIAGNOSIS — Z0000 Encounter for general adult medical examination without abnormal findings: Secondary | ICD-10-CM

## 2011-09-23 DIAGNOSIS — E782 Mixed hyperlipidemia: Secondary | ICD-10-CM

## 2011-09-23 DIAGNOSIS — R7309 Other abnormal glucose: Secondary | ICD-10-CM

## 2011-09-23 DIAGNOSIS — D126 Benign neoplasm of colon, unspecified: Secondary | ICD-10-CM

## 2011-09-23 NOTE — Patient Instructions (Signed)
Please  schedule fasting Labs : BMET,Lipids, hepatic panel, CBC & dif, TSH, A1c. PLEASE BRING THESE INSTRUCTIONS TO FOLLOW UP  LAB APPOINTMENT.This will guarantee correct labs are drawn, eliminating need for repeat blood sampling ( needle sticks ! ). Diagnoses /Codes: see Problem List   Preventive Health Care: Exercise at least 30-45 minutes a day,  3-4 days a week.  Eat a low-fat diet with lots of fruits and vegetables, up to 7-9 servings per day. .Consume less than 40 grams of sugar per day from foods & drinks with High Fructose Corn Sugar as # 1,2,3 or # 4 on label. Blood Pressure Goal  Ideally is an AVERAGE < 135/85. This AVERAGE should be calculated from @ least 5-7 BP readings taken @ different times of day on different days of week. You should not respond to isolated BP readings , but rather the AVERAGE for that week  .

## 2011-09-23 NOTE — Assessment & Plan Note (Signed)
Home blood pressure readings indicate excellent control.

## 2011-09-23 NOTE — Assessment & Plan Note (Signed)
Prostate exam is normal at this time; PSA recommendations discussed.

## 2011-09-23 NOTE — Progress Notes (Signed)
Subjective:    Patient ID: [name], male    DOB: 1936/03/13, 76 y.o.   MRN: [id number]  HPI Medicare Wellness Visit:  The following psychosocial & medical history were reviewed as required by Medicare.   Social history: caffeine: 5 cups / day , alcohol:  occasionally ,  tobacco use : quit 1986  & exercise : gym 3-4 X/ week.   Home & personal  safety / fall risk: no, activities of daily living: no limitations , seatbelt use : yes , and smoke alarm employment : yes .  Power of Attorney/Living Will status : in place  Vision ( as recorded per Nurse) & Hearing  evaluation : last Ophth exam 8/12 ; no issues.Whisper heard @ 6 ft Orientation :oriented x 3 , memory & recall : good,  math testing: good,and mood & affect : good  . Depression / anxiety: denied Travel history : [address] [place] , immunization status :PNAshot needed , transfusion history: no , and preventive health surveillance ( colonoscopies, BMD , etc as per protocol/ SOC): up to date, Dental care:  Seen every 6 mos . Chart reviewed &  Updated. Active issues reviewed & addressed.      Review of Systems HYPERTENSION: Disease Monitoring: Blood pressure range- 110-114/58-68- Chest pain, palpitations- no       Dyspnea- no Medications: Compliance- yes  Lightheadedness,Syncope- no    Edema- no  HYPERLIPIDEMIA: Disease Monitoring: See symptoms for Hypertension Medications: Compliance- yes  Abd pain, bowel changes- no   Muscle aches- no        Objective:   Physical Exam Gen.:  well-nourished in appearance. Alert, appropriate and cooperative throughout exam. Head: Normocephalic without obvious abnormalities;  Pattern  alopecia  Eyes: No corneal or conjunctival inflammation noted. Pupils equal round reactive to light and accommodation. Ptosis bilaterally. Extraocular motion intact.  Ears: External  ear exam reveals no significant lesions or deformities. Canals clear .TMs normal. Hearing is grossly normal bilaterally. Nose:  External nasal exam reveals no deformity or inflammation. Nasal mucosa are pink and moist. No lesions or exudates noted.   Mouth: Oral mucosa and oropharynx reveal no lesions or exudates. Teeth in good repair. Neck: No deformities, masses, or tenderness noted. Range of motion & . Thyroid normal. Lungs: Normal respiratory effort; chest expands symmetrically. Lungs are clear to auscultation without rales, wheezes, or increased work of breathing. Heart: Normal rate and rhythm. Normal S1 and S2. No gallop, click, or rub. S 4 w/o murmur. Abdomen: Bowel sounds normal; abdomen soft and nontender. No masses, organomegaly or hernias noted. Genitalia/DRE: Varices in the left scrotum. Prostate is normal without enlargement, asymmetry, nodularity, or induration.  .                                                                                   Musculoskeletal/extremities: No deformity or scoliosis noted of  the thoracic or lumbar spine, but mild asymmetry of thoracic muscles, R > L. No clubbing, cyanosis, edema, or deformity noted. Range of motion  normal .Tone & strength  normal.Joints normal. Nail health  good. Vascular: Carotid, radial artery, dorsalis pedis and  posterior tibial pulses are full and equal. No  bruits present. Neurologic: Alert and oriented x3. Deep tendon reflexes symmetrical and normal.          Skin: Intact without suspicious lesions or rashes. Solar changes Lymph: No cervical, axillary, or inguinal lymphadenopathy present. Psych: Mood and affect are normal. Normally interactive                                                                                         Assessment & Plan:  #1 Medicare Wellness Exam; criteria met ; data entered #2 Problem List reviewed ; Assessment/ Recommendations made Plan: see Orders

## 2011-09-24 ENCOUNTER — Other Ambulatory Visit: Payer: Self-pay | Admitting: Internal Medicine

## 2011-09-24 DIAGNOSIS — D126 Benign neoplasm of colon, unspecified: Secondary | ICD-10-CM

## 2011-09-24 DIAGNOSIS — I10 Essential (primary) hypertension: Secondary | ICD-10-CM

## 2011-09-24 DIAGNOSIS — E039 Hypothyroidism, unspecified: Secondary | ICD-10-CM

## 2011-09-24 DIAGNOSIS — E782 Mixed hyperlipidemia: Secondary | ICD-10-CM

## 2011-09-24 DIAGNOSIS — R7309 Other abnormal glucose: Secondary | ICD-10-CM

## 2011-09-25 ENCOUNTER — Other Ambulatory Visit (INDEPENDENT_AMBULATORY_CARE_PROVIDER_SITE_OTHER): Payer: Medicare Other

## 2011-09-25 DIAGNOSIS — I10 Essential (primary) hypertension: Secondary | ICD-10-CM

## 2011-09-25 DIAGNOSIS — E782 Mixed hyperlipidemia: Secondary | ICD-10-CM

## 2011-09-25 DIAGNOSIS — D126 Benign neoplasm of colon, unspecified: Secondary | ICD-10-CM

## 2011-09-25 DIAGNOSIS — R7309 Other abnormal glucose: Secondary | ICD-10-CM

## 2011-09-25 DIAGNOSIS — E039 Hypothyroidism, unspecified: Secondary | ICD-10-CM

## 2011-09-25 LAB — BASIC METABOLIC PANEL
BUN: 22 mg/dL (ref 6–23)
Calcium: 9.1 mg/dL (ref 8.4–10.5)
Creatinine, Ser: 1 mg/dL (ref 0.4–1.5)
GFR: 75.13 mL/min (ref >60.00)
Glucose, Bld: 92 mg/dL (ref 70–99)
Potassium: 4.1 mEq/L (ref 3.5–5.1)

## 2011-09-25 LAB — CBC WITH DIFFERENTIAL/PLATELET
Basophils Absolute: 0.1 10*3/uL (ref 0.0–0.1)
Basophils Relative: 0.9 % (ref 0.0–3.0)
Eosinophils Absolute: 0.5 10*3/uL (ref 0.0–0.7)
MCHC: 33.5 g/dL (ref 30.0–36.0)
MCV: 92.4 fl (ref 78.0–100.0)
Monocytes Absolute: 0.6 10*3/uL (ref 0.1–1.0)
Neutro Abs: 4.3 10*3/uL (ref 1.4–7.7)
Neutrophils Relative %: 63.2 % (ref 43.0–77.0)
RBC: 4.79 Mil/uL (ref 4.22–5.81)
RDW: 14.7 % — ABNORMAL HIGH (ref 11.5–14.6)

## 2011-09-25 LAB — LIPID PANEL
Cholesterol: 152 mg/dL (ref 0–200)
VLDL: 18.8 mg/dL (ref 0.0–40.0)

## 2011-09-25 LAB — HEPATIC FUNCTION PANEL
ALT: 19 U/L (ref 0–53)
Alkaline Phosphatase: 49 U/L (ref 39–117)
Bilirubin, Direct: 0.1 mg/dL (ref 0.0–0.3)
Total Bilirubin: 0.7 mg/dL (ref 0.3–1.2)

## 2011-09-25 LAB — HEMOGLOBIN A1C: Hgb A1c MFr Bld: 6.2 % (ref 4.6–6.5)

## 2011-10-12 ENCOUNTER — Other Ambulatory Visit: Payer: Self-pay | Admitting: Internal Medicine

## 2011-11-03 ENCOUNTER — Other Ambulatory Visit: Payer: Self-pay | Admitting: Internal Medicine

## 2011-11-17 ENCOUNTER — Other Ambulatory Visit: Payer: Self-pay | Admitting: Internal Medicine

## 2011-11-26 ENCOUNTER — Encounter: Payer: Self-pay | Admitting: Cardiology

## 2011-11-26 ENCOUNTER — Ambulatory Visit (INDEPENDENT_AMBULATORY_CARE_PROVIDER_SITE_OTHER): Payer: Medicare Other | Admitting: Cardiology

## 2011-11-26 VITALS — BP 102/70 | HR 60 | Resp 12 | Ht 67.0 in | Wt 193.0 lb

## 2011-11-26 DIAGNOSIS — R7309 Other abnormal glucose: Secondary | ICD-10-CM

## 2011-11-26 DIAGNOSIS — I2510 Atherosclerotic heart disease of native coronary artery without angina pectoris: Secondary | ICD-10-CM

## 2011-11-26 DIAGNOSIS — R0989 Other specified symptoms and signs involving the circulatory and respiratory systems: Secondary | ICD-10-CM

## 2011-11-26 MED ORDER — NITROGLYCERIN 0.4 MG SL SUBL: 0.4000 mg | SUBLINGUAL_TABLET | SUBLINGUAL | Status: DC | PRN

## 2011-11-26 MED ORDER — ATORVASTATIN CALCIUM 40 MG PO TABS: 20.0000 mg | ORAL_TABLET | Freq: Every day | ORAL | Status: DC

## 2011-11-26 MED ORDER — DILTIAZEM HCL ER 180 MG PO CP24: 180.0000 mg | ORAL_CAPSULE | Freq: Every day | ORAL | Status: DC

## 2011-11-26 NOTE — Assessment & Plan Note (Signed)
Coronary disease coronary disease is stable. He does not need any further workup at this time.

## 2011-11-26 NOTE — Patient Instructions (Signed)
Your physician wants you to follow-up in: 12 months.   You will receive a reminder letter in the mail two months in advance. If you don't receive a letter, please call our office to schedule the follow-up appointment  Your physician has requested that you have a carotid duplex. This test is an ultrasound of the carotid arteries in your neck. It looks at blood flow through these arteries that supply the brain with blood. Allow one hour for this exam. There are no restrictions or special instructions.    

## 2011-11-26 NOTE — Assessment & Plan Note (Signed)
Patient is a very soft right carotid bruit. I have carefully reviewed and he does not have a Doppler data from the past. He has known vascular disease. We will schedule carotid Doppler.

## 2011-11-26 NOTE — Progress Notes (Signed)
HPI Patient is seen today to followup coronary disease. This is a 1 year followup. I saw him last in April, 2012. He has known coronary disease. Fortunately he's done well. He's not having any significant chest pain. He received a drug-eluting stent to the circumflex in 2006. He had a nuclear scan in February, 2011. Ejection fraction was normal and there was no ischemia. He has not had any syncope or presyncope. He's not having any significant shortness of breath.  Allergies  Allergen Reactions  . Amlodipine Besylate     REACTION: edema  . Codeine     Current Outpatient Prescriptions  Medication Sig Dispense Refill  . aspirin 325 MG tablet Take 325 mg by mouth daily.        Marland Kitchen atorvastatin (LIPITOR) 40 MG tablet TAKE 1/2 TABLET BY MOUTH DAILY  30 tablet  6  . benazepril (LOTENSIN) 20 MG tablet TAKE 1 AND 1/2 TABLETS BY MOUTH EVERY DAY  135 tablet  2  . Chlorpheniramine-DM (CORICIDIN HBP COUGH/COLD PO) Take by mouth as needed.        . diltiazem (DILACOR XR) 180 MG 24 hr capsule TAKE ONE CAPSULE BY MOUTH EVERY DAY  90 capsule  2  . fluticasone (FLOVENT DISKUS) 50 MCG/BLIST diskus inhaler Inhale 1 puff into the lungs 2 (two) times daily as needed.       Marland Kitchen levothyroxine (SYNTHROID, LEVOTHROID) 150 MCG tablet TAKE 1 TABLET BY MOUTH EVERY DAY EXCEPT TAKE 1 AND 1/2 TABLETS ON WEDNESDAY  102 tablet  1  . metoprolol tartrate (LOPRESSOR) 25 MG tablet TAKE 1/2 TABLET BY MOUTH TWICE DAILY  90 tablet  2  . nitroGLYCERIN (NITROSTAT) 0.4 MG SL tablet Place 0.4 mg under the tongue every 5 (five) minutes as needed.        Marland Kitchen spironolactone (ALDACTONE) 25 MG tablet TAKE 1 TABLET BY MOUTH EVERY DAY  90 tablet  3    History   Social History  . Marital Status: Married    Spouse Name: N/A    Number of Children: N/A  . Years of Education: N/A   Occupational History  . retired    Social History Main Topics  . Smoking status: Former Smoker    Types: Cigarettes    Quit date: 09/21/1984  . Smokeless  tobacco: Not on file  . Alcohol Use: Yes     socially on occasion  . Drug Use: No  . Sexually Active: Not on file   Other Topics Concern  . Not on file   Social History Narrative  . No narrative on file    Family History  Problem Relation Age of Onset  . Coronary artery disease Mother     carotid endarterectomy bilaterally  . Colon cancer Father   . Hypertension Father   . Pancreatitis Father   . Diabetes Sister   . Diabetes Maternal Grandfather   . Heart attack Maternal Grandfather 70  . Heart attack Sister 55    Past Medical History  Diagnosis Date  . Hypertension   . Hypothyroidism   . Hyperlipidemia   . BPH (benign prostatic hypertrophy)   . History of colonic polyps     due ? 2013  . Skin cancer (melanoma)     PMH of   . Coronary artery disease     DES Circumflex or CVA 2006  /  clear, February, 2011, EF 70%, no ischemia or  . Venous insufficiency   . Carotid bruit     March, 2013  Past Surgical History  Procedure Date  . Total hip arthroplasty 2007  . Tonsillectomy   . Toe surgery 1997  . Coronary angioplasty with stent placement 2006  . Colonoscopy w/ polypectomy 2008    ROS  Patient denies fever, chills, headache, sweats, rash, change in vision, change in hearing, chest pain, cough, nausea vomiting, urinary symptoms. All other systems are reviewed and are negative.  PHYSICAL EXAM Patient is oriented to person time and place. Affect is normal. He's here with his wife. Head is atraumatic. There is no jugulovenous distention. There is question of a soft right carotid bruit. Lungs are clear. Respiratory effort is nonlabored. Cardiac exam reveals S1 and S2. There no clicks or significant murmurs. The abdomen is soft. There is no peripheral edema. There no musculoskeletal deformities. There are no skin rashes.  Filed Vitals:   11/26/11 1116  BP: 102/70  Pulse: 60  Resp: 12  Height: 5\' 7"  (1.702 m)  Weight: 193 lb (87.544 kg)   EKG is done today  and reviewed by me. There is normal sinus rhythm. There is no significant abnormality. There is no change from the past.  ASSESSMENT & PLAN

## 2011-11-26 NOTE — Assessment & Plan Note (Signed)
Lipids are being treated. No change in therapy. 

## 2011-12-08 ENCOUNTER — Encounter (INDEPENDENT_AMBULATORY_CARE_PROVIDER_SITE_OTHER): Payer: Medicare Other

## 2011-12-08 DIAGNOSIS — R0989 Other specified symptoms and signs involving the circulatory and respiratory systems: Secondary | ICD-10-CM

## 2011-12-08 DIAGNOSIS — I6529 Occlusion and stenosis of unspecified carotid artery: Secondary | ICD-10-CM

## 2012-02-06 ENCOUNTER — Other Ambulatory Visit: Payer: Self-pay | Admitting: Cardiology

## 2012-05-03 ENCOUNTER — Other Ambulatory Visit: Payer: Self-pay | Admitting: Internal Medicine

## 2012-05-15 ENCOUNTER — Other Ambulatory Visit: Payer: Self-pay | Admitting: Internal Medicine

## 2012-05-17 ENCOUNTER — Other Ambulatory Visit (INDEPENDENT_AMBULATORY_CARE_PROVIDER_SITE_OTHER): Payer: Medicare Other

## 2012-05-17 DIAGNOSIS — R7309 Other abnormal glucose: Secondary | ICD-10-CM

## 2012-05-17 DIAGNOSIS — R946 Abnormal results of thyroid function studies: Secondary | ICD-10-CM

## 2012-05-18 ENCOUNTER — Other Ambulatory Visit: Payer: Self-pay | Admitting: Internal Medicine

## 2012-05-30 ENCOUNTER — Encounter: Payer: Self-pay | Admitting: Cardiology

## 2012-05-30 ENCOUNTER — Telehealth: Payer: Self-pay | Admitting: Cardiology

## 2012-05-30 NOTE — Progress Notes (Signed)
   The patient has known coronary disease. When I saw him last he was stable and not using any type of nitroglycerin on a regular basis. He has asked if it would be safe from the cardiac viewpoint for him to use Viagra. From my viewpoint the patient can be allowed to use Viagra as long as he understands completely that it can never be used around the time of the use of nitroglycerin.  Jerral Bonito, MD

## 2012-05-30 NOTE — Telephone Encounter (Signed)
Mr Motton called regarding viagra.  He states that Dr Alwyn Ren wants a letter from Dr Myrtis Ser stating that it is ok with him if Mr Engelbert gets viagra.

## 2012-05-30 NOTE — Telephone Encounter (Signed)
I will do a documentation note in the chart sending a copy to Dr. Alwyn Ren

## 2012-05-30 NOTE — Telephone Encounter (Signed)
plz  Return call to patient regarding medical care questions [phone]

## 2012-05-31 ENCOUNTER — Ambulatory Visit (INDEPENDENT_AMBULATORY_CARE_PROVIDER_SITE_OTHER): Payer: Medicare Other | Admitting: Internal Medicine

## 2012-05-31 ENCOUNTER — Encounter: Payer: Self-pay | Admitting: Internal Medicine

## 2012-05-31 VITALS — BP 122/70 | HR 71 | Wt 191.0 lb

## 2012-05-31 DIAGNOSIS — N529 Male erectile dysfunction, unspecified: Secondary | ICD-10-CM

## 2012-05-31 DIAGNOSIS — E039 Hypothyroidism, unspecified: Secondary | ICD-10-CM

## 2012-05-31 DIAGNOSIS — Z85820 Personal history of malignant melanoma of skin: Secondary | ICD-10-CM

## 2012-05-31 DIAGNOSIS — R7309 Other abnormal glucose: Secondary | ICD-10-CM

## 2012-05-31 MED ORDER — LEVOTHYROXINE SODIUM 150 MCG PO TABS: 150.0000 ug | ORAL_TABLET | Freq: Every day | ORAL | Status: DC

## 2012-05-31 MED ORDER — SILDENAFIL CITRATE 100 MG PO TABS: ORAL_TABLET | ORAL | Status: DC

## 2012-05-31 NOTE — Assessment & Plan Note (Signed)
Sample and prescription provided

## 2012-05-31 NOTE — Progress Notes (Signed)
Subjective:    Patient ID: [name], male    DOB: 16-Sep-1936, 76 y.o.   MRN: [id number]  HPI  #1 Hypothyroidism: Medications status(change in dose/brand/mode of administration):no TSH: 0.91                                                                            PMH of fasting hyperglycemia Fasting or morning glucose range:  No monitor  .                                                                               Exercise : 8-10 hrs/ week including yardwork . Nutrition/diet:  Heart healthy. Medication compliance : on no agents for glucose  Foot care : no.  A1c/ urine microalbumin monitor:  6.1%       Review of Systems #1 Fatigue:no; Sleep pattern:good; Appetite:good Hoarseness:no; Swallowing issues:no GI: Constipation:no; Diarrhea:no Derm: Change in nails/hair/skin:no Neuro:  Tremor:no Psych: Anxiety:no; Depression:no; Panic attacks:no Endo: Temperature intolerance: Heat:no; Cold:no     # 2 Hypoglycemia :  Rarely post fasting .                                                     Excess thirst ; hunger; urination:  no.                                  Lightheadedness with standing:  no. Chest pain:  no ; Palpitations :no ;  Pain in  calves with walking:  No, occasional nocturnal cramps .     Non healing skin  ulcers or sores,especially over the feet: no . Numbness or tingling or burning in feet : no .                                                                                                                                           Significant change in  Weight : no. Vision changes :no; last Ophth exam 4/13 revealed no retinopathy  .    I reviewed Dr. Henrietta Hoover documentation concerning Viagra.  Objective:   Physical Exam  Gen.:  well-nourished; in no acute distress Eyes: Extraocular motion intact; no lid lag or proptosis Neck: normal ROM; thyroid  normal Heart: Slightly irregular rhythm and normal rate without significant murmur, gallop, or extra heart sounds Lungs: Chest clear to auscultation without rales,rales, wheezes Neuro:Deep tendon reflexes are equal and within normal limits; faint RUE  Tremor. Light touch normal over feet.   Skin: Warm and dry without significant lesions or rashes; Some toenail onycholysis. Deeply tanned Psych: Normally communicative and interactive; no abnormal mood or affect clinically.        Assessment & Plan:

## 2012-05-31 NOTE — Assessment & Plan Note (Signed)
A1c is at the cutoff for diabetes risk. Nutrition and exercise will be stressed. No medications indicated. A1c should be monitored every 6 months.

## 2012-05-31 NOTE — Assessment & Plan Note (Addendum)
TSH was therapeutic at 0.91. He does have some dysrhythmia on physical exam; his EKG 11/26/11 was reviewed. It revealed marked sinus arrhythmia.  Atrial fibrillation was not present. Copy presented to him  He has a fine tremor of the right upper extremity; he states this is related to shoulder orthopedic issues  Change  thyroid dose to 1 pill daily

## 2012-05-31 NOTE — Patient Instructions (Addendum)
If you activate My Chart; the results can be released to you as soon as they populate from the lab. If you choose not to use this program; the labs have to be reviewed, copied & mailed   causing a delay in getting the results to you.  Decrease thyroid to one pill daily; recheck TSH with A1c in 6 months PLEASE BRING THESE INSTRUCTIONS TO FOLLOW UP  LAB APPOINTMENT. This will guarantee correct labs are drawn, eliminating need for repeat blood sampling ( needle sticks ! ). Diagnoses Gertie Fey: [phone],[phone]

## 2012-05-31 NOTE — Assessment & Plan Note (Signed)
Some exposure risk discussed

## 2012-07-06 ENCOUNTER — Other Ambulatory Visit: Payer: Self-pay | Admitting: Internal Medicine

## 2012-07-06 MED ORDER — BENAZEPRIL HCL 20 MG PO TABS: ORAL_TABLET | ORAL | Status: DC

## 2012-07-06 MED ORDER — METOPROLOL TARTRATE 25 MG PO TABS: ORAL_TABLET | ORAL | Status: DC

## 2012-07-06 NOTE — Telephone Encounter (Signed)
refills x 2 last ov 9.10.13-follow up    1-METOPROLOL TARTRATE 25MG  TABLETS #90 TK 1/2 T PO BID LAST FILL 8.17.13  2-BENAZEPRIL 20MG  TABLETS #135 TK 1 AND 1/2 TS PO QD LAST FILL 8.17.13

## 2012-09-06 ENCOUNTER — Ambulatory Visit (INDEPENDENT_AMBULATORY_CARE_PROVIDER_SITE_OTHER): Payer: Medicare Other

## 2012-09-06 DIAGNOSIS — Z23 Encounter for immunization: Secondary | ICD-10-CM

## 2012-09-12 ENCOUNTER — Other Ambulatory Visit: Payer: Self-pay | Admitting: Internal Medicine

## 2012-10-10 ENCOUNTER — Encounter: Payer: Self-pay | Admitting: Gastroenterology

## 2012-11-24 ENCOUNTER — Encounter: Payer: Self-pay | Admitting: Cardiology

## 2012-11-24 DIAGNOSIS — I739 Peripheral vascular disease, unspecified: Secondary | ICD-10-CM

## 2012-11-24 DIAGNOSIS — I779 Disorder of arteries and arterioles, unspecified: Secondary | ICD-10-CM | POA: Insufficient documentation

## 2012-11-25 ENCOUNTER — Ambulatory Visit: Payer: Medicare Other | Admitting: Cardiology

## 2012-12-05 ENCOUNTER — Other Ambulatory Visit (INDEPENDENT_AMBULATORY_CARE_PROVIDER_SITE_OTHER): Payer: Medicare Other

## 2012-12-05 ENCOUNTER — Encounter: Payer: Self-pay | Admitting: *Deleted

## 2012-12-05 DIAGNOSIS — R7309 Other abnormal glucose: Secondary | ICD-10-CM

## 2012-12-05 DIAGNOSIS — E039 Hypothyroidism, unspecified: Secondary | ICD-10-CM

## 2012-12-05 LAB — HEMOGLOBIN A1C: Hgb A1c MFr Bld: 6 % (ref 4.6–6.5)

## 2012-12-05 LAB — TSH: TSH: 1.41 u[IU]/mL (ref 0.35–5.50)

## 2012-12-18 ENCOUNTER — Encounter: Payer: Self-pay | Admitting: Cardiology

## 2012-12-18 DIAGNOSIS — R251 Tremor, unspecified: Secondary | ICD-10-CM | POA: Insufficient documentation

## 2012-12-21 ENCOUNTER — Encounter (INDEPENDENT_AMBULATORY_CARE_PROVIDER_SITE_OTHER): Payer: Medicare Other

## 2012-12-21 DIAGNOSIS — I6529 Occlusion and stenosis of unspecified carotid artery: Secondary | ICD-10-CM

## 2012-12-22 ENCOUNTER — Encounter: Payer: Self-pay | Admitting: Cardiology

## 2012-12-22 ENCOUNTER — Ambulatory Visit (INDEPENDENT_AMBULATORY_CARE_PROVIDER_SITE_OTHER): Payer: Medicare Other | Admitting: Cardiology

## 2012-12-22 VITALS — BP 134/68 | HR 60 | Ht 69.0 in | Wt 193.8 lb

## 2012-12-22 DIAGNOSIS — I779 Disorder of arteries and arterioles, unspecified: Secondary | ICD-10-CM

## 2012-12-22 DIAGNOSIS — R011 Cardiac murmur, unspecified: Secondary | ICD-10-CM

## 2012-12-22 DIAGNOSIS — I2510 Atherosclerotic heart disease of native coronary artery without angina pectoris: Secondary | ICD-10-CM

## 2012-12-22 DIAGNOSIS — E042 Nontoxic multinodular goiter: Secondary | ICD-10-CM

## 2012-12-22 DIAGNOSIS — E782 Mixed hyperlipidemia: Secondary | ICD-10-CM

## 2012-12-22 DIAGNOSIS — I10 Essential (primary) hypertension: Secondary | ICD-10-CM

## 2012-12-22 MED ORDER — ATORVASTATIN CALCIUM 40 MG PO TABS: 40.0000 mg | ORAL_TABLET | Freq: Every day | ORAL | Status: DC

## 2012-12-22 MED ORDER — DILTIAZEM HCL ER 180 MG PO CP24: 180.0000 mg | ORAL_CAPSULE | Freq: Every day | ORAL | Status: DC

## 2012-12-22 NOTE — Progress Notes (Signed)
HPI  Patient is seen to followup coronary disease. He's doing very well. I saw him last March, 2013. He's not had any chest pain or shortness of breath. He's going about full activities. After I saw him last year he had a carotid Doppler in March, 2013. It was stable and it was recommended to be followed up this year. He actually had a repeat Doppler yesterday. The preliminary is no change. I am waiting for the final data.  Allergies  Allergen Reactions  . Amlodipine Besylate     REACTION: edema  . Codeine     constipation    Current Outpatient Prescriptions  Medication Sig Dispense Refill  . aspirin 325 MG tablet Take 325 mg by mouth daily.        Marland Kitchen atorvastatin (LIPITOR) 40 MG tablet TAKE 1/2 TABLET BY MOUTH DAILY  90 tablet  0  . benazepril (LOTENSIN) 20 MG tablet TAKE 1 AND 1/2 TABLETS BY MOUTH EVERY DAY  135 tablet  1  . Chlorpheniramine-DM (CORICIDIN HBP COUGH/COLD PO) Take by mouth as needed.        . diltiazem (DILACOR XR) 180 MG 24 hr capsule Take 1 capsule (180 mg total) by mouth daily.  90 capsule  3  . levothyroxine (SYNTHROID, LEVOTHROID) 150 MCG tablet Take 1 tablet (150 mcg total) by mouth daily.  90 tablet  3  . metoprolol tartrate (LOPRESSOR) 25 MG tablet TAKE 1/2 TABLET BY MOUTH TWICE DAILY  90 tablet  1  . nitroGLYCERIN (NITROSTAT) 0.4 MG SL tablet Place 1 tablet (0.4 mg total) under the tongue every 5 (five) minutes as needed.  25 tablet  11  . sildenafil (VIAGRA) 100 MG tablet 1/2-1 qd prn  5 tablet  11  . spironolactone (ALDACTONE) 25 MG tablet TAKE 1 TABLET BY MOUTH DAILY  90 tablet  1   No current facility-administered medications for this visit.    History   Social History  . Marital Status: Married    Spouse Name: N/A    Number of Children: N/A  . Years of Education: N/A   Occupational History  . retired    Social History Main Topics  . Smoking status: Former Smoker    Types: Cigarettes    Quit date: 09/21/1984  . Smokeless tobacco: Not on file    . Alcohol Use: Yes     Comment: socially on occasion  . Drug Use: No  . Sexually Active: Not on file   Other Topics Concern  . Not on file   Social History Narrative  . No narrative on file    Family History  Problem Relation Age of Onset  . Coronary artery disease Mother     carotid endarterectomy bilaterally  . Colon cancer Father   . Hypertension Father   . Pancreatitis Father   . Diabetes Sister   . Diabetes Maternal Grandfather   . Heart attack Maternal Grandfather 70  . Heart attack Sister 86    Past Medical History  Diagnosis Date  . Hypertension   . Hypothyroidism   . Hyperlipidemia   . BPH (benign prostatic hypertrophy)   . History of colonic polyps     due ? 2013  . Skin cancer (melanoma)     PMH of   . Coronary artery disease     DES Circumflex or CVA 2006  /  clear, February, 2011, EF 70%, no ischemia or  . Venous insufficiency   . Carotid artery disease  Doppler, December 08, 2011, 00 39% R. ICA, 40-59% LICA, followup 1 year  . Tremor     Fine tremor right upper extremity  . Ejection fraction     EF 60%, echo, 2009, mildly calcified aortic leaflets    Past Surgical History  Procedure Laterality Date  . Total hip arthroplasty  2007  . Tonsillectomy    . Toe surgery  1997  . Coronary angioplasty with stent placement  2006  . Colonoscopy w/ polypectomy  2008    Patient Active Problem List  Diagnosis  . COLONIC POLYPS, HYPERPLASTIC  . HYPOTHYROIDISM  . HYPERLIPIDEMIA  . HYPERTENSION, ESSENTIAL NOS  . DEGENERATIVE JOINT DISEASE  . MURMUR  . FASTING HYPERGLYCEMIA  . MELANOMA, HX OF  . BENIGN PROSTATIC HYPERTROPHY, HX OF  . Coronary artery disease  . Venous insufficiency  . Erectile dysfunction  . Carotid artery disease  . Tremor    ROS Patient denies fever, chills, headache, sweats, rash, change in vision, change in hearing, chest pain, cough, nausea vomiting, urinary symptoms. All other systems are reviewed and are  negative.  PHYSICAL EXAM Patient is quite stable. He is oriented to person time and place. Affect is normal. There is no jugulovenous distention. Lungs are clear. Respiratory effort is nonlabored. Cardiac exam reveals S1 and S2. There no clicks. There is a soft systolic murmur. The abdomen is soft. There's no peripheral edema.  Filed Vitals:   12/22/12 1105  BP: 134/68  Pulse: 60  Height: 5\' 9"  (1.753 m)  Weight: 193 lb 12.8 oz (87.907 kg)  SpO2: 95%   EKG is done today and reviewed by me. There sinus rhythm with mild sinus bradycardia. There are mild nonspecific ST-T wave changes. There is no significant change.  ASSESSMENT & PLAN

## 2012-12-22 NOTE — Assessment & Plan Note (Signed)
Coronary disease is stable. No change in therapy. 

## 2012-12-22 NOTE — Assessment & Plan Note (Signed)
This is a new problem. The patient had a carotid Doppler yesterday. There is mention of incidental finding of 2 a vascular echogenic structures in the right thyroid. One measures 0.6 cm x 0.5 cm. The other measures 1.2 cm x 0.8 cm. Plans are being made for thyroid ultrasound and followup with patient's primary physician.  As part of today's evaluation I spent greater than 25 minutes with the patient. More than half of this time was with direct contact with him. I talked to him about all of his cardiac issues. I also explained to him about the finding in his thyroid and we outlined the plan is for further evaluation.

## 2012-12-22 NOTE — Patient Instructions (Addendum)
Your physician wants you to follow-up in: 1 year.  You will receive a reminder letter in the mail two months in advance. If you don't receive a letter, please call our office to schedule the follow-up appointment.  Your physician recommends that you schedule a follow-up appointment in: after your thyroid ultrasound with Dr Alwyn Ren  Dr Myrtis Ser would like you to have a thyroid ultrasound.  This will be done at [facility] or [facility]

## 2012-12-22 NOTE — Assessment & Plan Note (Signed)
Patient is being treated with guidelines directed medical therapy including Lipitor 40.

## 2012-12-22 NOTE — Assessment & Plan Note (Signed)
Patient had followup carotid Doppler done yesterday. Lesions are stable followup 1 year

## 2012-12-22 NOTE — Assessment & Plan Note (Signed)
Blood pressures control. No change in therapy. 

## 2012-12-22 NOTE — Assessment & Plan Note (Signed)
The patient had mildly calcified aortic leaflets in 2009. He does not need a followup echo at this time.

## 2012-12-27 ENCOUNTER — Ambulatory Visit (HOSPITAL_COMMUNITY)
Admission: RE | Admit: 2012-12-27 | Discharge: 2012-12-27 | Disposition: A | Discharge status: Home / Self Care | Payer: Medicare Other | Source: Ambulatory Visit | Attending: Cardiology | Admitting: Cardiology

## 2012-12-27 DIAGNOSIS — E042 Nontoxic multinodular goiter: Secondary | ICD-10-CM

## 2012-12-27 DIAGNOSIS — I7789 Other specified disorders of arteries and arterioles: Secondary | ICD-10-CM | POA: Insufficient documentation

## 2013-01-06 ENCOUNTER — Encounter: Payer: Self-pay | Admitting: Internal Medicine

## 2013-01-06 ENCOUNTER — Ambulatory Visit (INDEPENDENT_AMBULATORY_CARE_PROVIDER_SITE_OTHER): Payer: Medicare Other | Admitting: Internal Medicine

## 2013-01-06 VITALS — BP 118/72 | HR 59 | Temp 97.8°F | Wt 191.0 lb

## 2013-01-06 DIAGNOSIS — E039 Hypothyroidism, unspecified: Secondary | ICD-10-CM

## 2013-01-06 NOTE — Progress Notes (Signed)
  Subjective:    Patient ID: [name], male    DOB: 26-Mar-1936, 77 y.o.   MRN: [id number]  HPI  On his carotid Doppler 12/21/12 there is suggestion of possible two small avascular structures. This was evaluated with thyroid ultrasound. This revealed inhomogenous thyroid tissue with no discrete or dominant nodule.  His TSH was therapeutic at 1.41 last month    Review of Systems  Constitutional: No significant change in weight; significant fatigue; sleep disorder; change in appetite. Eye: no blurred, double ,loss of vision. Retinal tears repaired this month. Cardiovascular: no palpitations; racing; irregularity ENT/GI: no constipation; diarrhea;hoarseness;dysphagia Derm: no change in nails,hair,skin Neuro: no numbness or tingling; tremor Psych:no anxiety; depression; panic attacks Endo: no temperature intolerance to heat ,cold     Objective:   Physical Exam  Gen.:  well-nourished; in no acute distress Eyes: Extraocular motion intact; no lid lag or proptosis ,nystagmus. Ptosis OS > OD. Hyperemia OD sclera Neck: no masses ; thyroid normal  Heart: Normal rhythm and slow rate without significant murmur, gallop, or extra heart sounds Lungs: Chest clear to auscultation without rales,rales, wheezes Neuro:Deep tendon reflexes are equal and within normal limits; no tremor  Skin: Warm and dry without significant lesions or rashes; no onycholysis Lymphatic: no cervical or axillary LA Psych: Normally communicative and interactive; no abnormal mood or affect clinically.          Assessment & Plan:

## 2013-01-06 NOTE — Assessment & Plan Note (Signed)
No change required in Synthroid dose; monitor TSH annually.

## 2013-01-06 NOTE — Patient Instructions (Addendum)
If you activate the  My Chart system; lab & Xray results will be released directly  to you as soon as I review & address these through the computer. If you choose not to sign up for My Chart within 36 hours of labs being drawn; results will be reviewed & interpretation added before being copied & mailed, causing a delay in getting the results to you.If you do not receive that report within 7-10 days ,please call. Additionally you can use this system to gain direct  access to your records  if  out of town or @ an office of a  physician who is not in  the My Chart network.  This improves continuity of care & places you in control of your medical record.  

## 2013-01-30 ENCOUNTER — Other Ambulatory Visit: Payer: Self-pay | Admitting: Internal Medicine

## 2013-02-01 ENCOUNTER — Other Ambulatory Visit: Payer: Self-pay | Admitting: General Practice

## 2013-02-01 MED ORDER — METOPROLOL TARTRATE 25 MG PO TABS: ORAL_TABLET | ORAL | Status: DC

## 2013-02-01 NOTE — Telephone Encounter (Signed)
Med filled.  

## 2013-02-02 ENCOUNTER — Other Ambulatory Visit: Payer: Self-pay

## 2013-03-08 ENCOUNTER — Other Ambulatory Visit: Payer: Self-pay

## 2013-03-08 MED ORDER — SPIRONOLACTONE 25 MG PO TABS: ORAL_TABLET | ORAL | Status: DC

## 2013-04-18 ENCOUNTER — Encounter: Payer: Self-pay | Admitting: Gastroenterology

## 2013-04-25 ENCOUNTER — Ambulatory Visit (INDEPENDENT_AMBULATORY_CARE_PROVIDER_SITE_OTHER): Payer: Medicare Other | Admitting: Internal Medicine

## 2013-04-25 ENCOUNTER — Encounter: Payer: Self-pay | Admitting: Internal Medicine

## 2013-04-25 VITALS — BP 128/80 | HR 53 | Temp 98.6°F | Wt 188.8 lb

## 2013-04-25 DIAGNOSIS — M545 Low back pain: Secondary | ICD-10-CM

## 2013-04-25 MED ORDER — CYCLOBENZAPRINE HCL 5 MG PO TABS: ORAL_TABLET | ORAL | Status: DC

## 2013-04-25 NOTE — Progress Notes (Signed)
  Subjective:    Patient ID: [name], male    DOB: November 25, 1935, 77 y.o.   MRN: [id number]  HPI   Symptoms began 2 weeks ago his left flank pain without specific injury or trigger other than lifting his lawnmower. He also has a history of degenerative disc disease/osteoarthritis in the lumbosacral spine  The pain is described as sharp, lasting minutes, and nonradiating.  Standing and rotating his thorax as well as walking can aggravate the pain.    Review of Systems  He denies any radiation of pain in the leg or associated leg numbness, tingling, weakness. He has no incontinence of urine or stool.  There is no change in the color or the temperature of the skin in this area. There is no associated rash.  He specifically denies dysuria, hematuria, or pyuria.  He has no abdominal pain, unexplained weight loss, melena, or rectal bleeding.     Objective:   Physical Exam  Gen.: Healthy and well-nourished in appearance. Alert, appropriate and cooperative throughout exam.  Neck: No deformities, masses, or tenderness noted. Range of motion slightly decreased. Abdomen: Bowel sounds normal; abdomen soft and nontender. No masses, organomegaly or hernias noted. No AAA                                Musculoskeletal/extremities: No deformity or scoliosis noted of  the thoracic or lumbar spine.   No clubbing, cyanosis, edema, or significant extremity  deformity noted. Range of motion normal .Tone & strength  Normal. Joints  reveal mild  DIP changes. Nail health good. Able to lie down & sit up w/o help. Negative SLR bilaterally  Neurologic: Alert and oriented x3. Deep tendon reflexes symmetrical and normal.  Gait normal  including heel & toe walking .        Skin: Intact without suspicious lesions or rashes. Lymph: No cervical, axillary lymphadenopathy present. Psych: Mood and affect are normal. Normally interactive                                                                                         Assessment & Plan:  #1 mechanical low back syndrome with no neurologic deficit  Plan: See orders and recommendations

## 2013-04-25 NOTE — Patient Instructions (Addendum)
The best exercises for the low back include freestyle swimming, stretch aerobics, &yoga.Cybex & Nautilus rather than dead weights are better for the back.Use an anti-inflammatory cream such as Aspercreme or Zostrix cream twice a day to the back as needed. In lieu of this warm moist compresses or  hot water bottle can be used. Do not apply ice .

## 2013-06-19 ENCOUNTER — Encounter: Payer: Self-pay | Admitting: Gastroenterology

## 2013-07-20 ENCOUNTER — Ambulatory Visit (INDEPENDENT_AMBULATORY_CARE_PROVIDER_SITE_OTHER): Payer: Medicare Other

## 2013-07-20 DIAGNOSIS — Z23 Encounter for immunization: Secondary | ICD-10-CM

## 2013-07-26 ENCOUNTER — Telehealth: Payer: Self-pay

## 2013-07-26 NOTE — Telephone Encounter (Signed)
Medication List and allergies: reviewed and updated  90 day supply/mail order: na Local prescriptions: [facility]  Immunizations due: pneumonia, shingles and Tdap; flu UTD  A/P:   No changes to FH or PSH Tdap--2000 CCS--scheduled with Dr Arlyce Dice 08/13/2013 PSA--unable to locate  To Discuss with Provider: Not at this time

## 2013-07-28 ENCOUNTER — Encounter: Payer: Self-pay | Admitting: Internal Medicine

## 2013-07-28 ENCOUNTER — Ambulatory Visit (INDEPENDENT_AMBULATORY_CARE_PROVIDER_SITE_OTHER): Payer: Medicare Other | Admitting: Internal Medicine

## 2013-07-28 VITALS — BP 126/74 | HR 61 | Temp 98.8°F | Ht 68.25 in | Wt 190.0 lb

## 2013-07-28 DIAGNOSIS — E039 Hypothyroidism, unspecified: Secondary | ICD-10-CM

## 2013-07-28 DIAGNOSIS — Z85820 Personal history of malignant melanoma of skin: Secondary | ICD-10-CM

## 2013-07-28 DIAGNOSIS — I10 Essential (primary) hypertension: Secondary | ICD-10-CM

## 2013-07-28 DIAGNOSIS — E782 Mixed hyperlipidemia: Secondary | ICD-10-CM

## 2013-07-28 DIAGNOSIS — Z0000 Encounter for general adult medical examination without abnormal findings: Secondary | ICD-10-CM

## 2013-07-28 DIAGNOSIS — D126 Benign neoplasm of colon, unspecified: Secondary | ICD-10-CM

## 2013-07-28 LAB — CBC WITH DIFFERENTIAL/PLATELET
Eosinophils Absolute: 0.4 10*3/uL (ref 0.0–0.7)
Lymphs Abs: 1.3 10*3/uL (ref 0.7–4.0)
MCHC: 33.7 g/dL (ref 30.0–36.0)
MCV: 91.3 fl (ref 78.0–100.0)
Monocytes Absolute: 0.7 10*3/uL (ref 0.1–1.0)
Neutrophils Relative %: 61.5 % (ref 43.0–77.0)
Platelets: 254 10*3/uL (ref 150.0–400.0)
RDW: 14.2 % (ref 11.5–14.6)
WBC: 6.3 10*3/uL (ref 4.5–10.5)

## 2013-07-28 LAB — LIPID PANEL
Cholesterol: 133 mg/dL (ref 0–200)
HDL: 41.9 mg/dL (ref >39.00)
LDL Cholesterol: 78 mg/dL (ref 0–99)
Total CHOL/HDL Ratio: 3
Triglycerides: 67 mg/dL (ref 0.0–149.0)
VLDL: 13.4 mg/dL (ref 0.0–40.0)

## 2013-07-28 LAB — HEPATIC FUNCTION PANEL
Bilirubin, Direct: 0 mg/dL (ref 0.0–0.3)
Total Bilirubin: 0.6 mg/dL (ref 0.3–1.2)

## 2013-07-28 LAB — BASIC METABOLIC PANEL
BUN: 17 mg/dL (ref 6–23)
CO2: 25 mEq/L (ref 19–32)
Chloride: 104 mEq/L (ref 96–112)
Creatinine, Ser: 1.1 mg/dL (ref 0.4–1.5)
Glucose, Bld: 91 mg/dL (ref 70–99)

## 2013-07-28 NOTE — Progress Notes (Signed)
Subjective:    Patient ID: [name], male    DOB: October 16, 1935, 77 y.o.   MRN: [id number]  HPI Medicare Wellness Visit: Psychosocial and medical history were reviewed as required by Medicare (history related to abuse, antisocial behavior , firearm risk). Social history: Caffeine: 4 cups coffee/day , Alcohol: socially only , Tobacco use: quit 1986 Exercise:see below Personal safety/fall risk:no Limitations of activities of daily living:no Seatbelt/ smoke alarm use:yes Healthcare Power of Attorney/Living Will status: in place Ophthalmologic exam status:current Hearing evaluation status:not current Orientation: Oriented X 3 Memory and recall: good Math testing:  good Depression/anxiety assessment: denied Foreign travel history:1963-4 [place] Immunization status for influenza/pneumonia/ shingles /tetanus:PNA, tetanus & shingles needed Transfusion history:no Preventive health care maintenance status: Colonoscopy as per protocol/standard care:pending Dental care:every 6 mos Chart reviewed and updated. Active issues reviewed and addressed as documented below.    Review of Systems A heart healthy diet is followed; exercise encompasses 45 minutes 3 times per week ( until recently) as gym exercises without symptoms.  Family history is positive for premature coronary disease in his sister. There is medication compliance with the statin.  Full dose ASA taken Specifically denied are  chest pain, palpitations, dyspnea, or claudication.  Significant abdominal symptoms, memory deficit, or myalgias not present. BP @ home not monitored; on ACE-I , CCB , Beta blocker, & spironolactone.     Objective:   Physical Exam  Gen.:  well-nourished in appearance. Alert, appropriate and cooperative throughout exam.Appears younger than stated age  Head: Normocephalic without obvious abnormalities; pattern alopecia  Eyes: No corneal or conjunctival inflammation noted. Pupils equal round reactive to light  and accommodation. Extraocular motion intact. Ptosis Ears: External  ear exam reveals no significant lesions or deformities. Canals clear .TMs normal. Hearing is grossly normal bilaterally. Nose: External nasal exam reveals no deformity or inflammation. Nasal mucosa are pink and moist. No lesions or exudates noted.   Mouth: Oral mucosa and oropharynx reveal no lesions or exudates. Teeth in good repair. Neck: No deformities, masses, or tenderness noted. Range of motion decreased. Thyroid small. Lungs: Normal respiratory effort; chest expands symmetrically. Lungs are clear to auscultation without rales, wheezes, or increased work of breathing. Heart: Slow rate and regular rhythm. Normal S1 and S2. No gallop, click, or rub. No murmur. Abdomen: Bowel sounds normal; abdomen soft and nontender. No masses or organomegaly . Ventral hernia noted. Genitalia: Genitalia normal except for left varices. Prostate is normal without enlargement, asymmetry, nodularity, or induration                                   Musculoskeletal/extremities: No deformity or scoliosis noted of  the thoracic or lumbar spine.  No clubbing, cyanosis, or edema noted. Range of motion normal .Tone & strength normal. Hand joints normal . Fingernail / toenail health good.Flexion toe changes Able to lie down & sit up w/o help. Negative SLR bilaterally Vascular: Carotid, radial artery, dorsalis pedis and  posterior tibial pulses are full and equal. No bruits present. Neurologic: Alert and oriented x3. Deep tendon reflexes symmetrical and normal.       Skin: Intact without suspicious lesions or rashes. Solar keratoses Lymph: No cervical, axillary, or inguinal lymphadenopathy present. Psych: Mood and affect are normal. Normally interactive  Assessment & Plan:  #1 Medicare Wellness Exam; criteria met ; data entered #2 Problem List/Diagnoses  reviewed Plan:  Assessments made/ Orders entered  

## 2013-07-28 NOTE — Patient Instructions (Signed)
Your next office appointment will be determined based upon review of your pending labs . Those instructions will be transmitted to you through My Chart  or by mail if you're not using this system.   

## 2013-07-31 ENCOUNTER — Encounter: Payer: Self-pay | Admitting: *Deleted

## 2013-08-01 ENCOUNTER — Ambulatory Visit (AMBULATORY_SURGERY_CENTER): Payer: Medicare Other | Admitting: *Deleted

## 2013-08-01 ENCOUNTER — Encounter: Payer: Self-pay | Admitting: Gastroenterology

## 2013-08-01 VITALS — Ht 68.0 in | Wt 190.6 lb

## 2013-08-01 DIAGNOSIS — Z860100 Personal history of colon polyps, unspecified: Secondary | ICD-10-CM

## 2013-08-01 MED ORDER — NA SULFATE-K SULFATE-MG SULF 17.5-3.13-1.6 GM/177ML PO SOLN: 1.0000 | Freq: Once | ORAL | Status: DC

## 2013-08-01 NOTE — Progress Notes (Signed)
No allergies to eggs or soy. No problems with anesthesia.  

## 2013-08-02 ENCOUNTER — Other Ambulatory Visit: Payer: Self-pay | Admitting: Internal Medicine

## 2013-08-02 NOTE — Telephone Encounter (Signed)
Benazepril refilled. 

## 2013-08-15 ENCOUNTER — Ambulatory Visit (AMBULATORY_SURGERY_CENTER): Payer: Medicare Other | Admitting: Gastroenterology

## 2013-08-15 ENCOUNTER — Encounter: Payer: Self-pay | Admitting: Gastroenterology

## 2013-08-15 VITALS — BP 118/60 | HR 56 | Temp 97.9°F | Resp 30 | Ht 68.0 in | Wt 190.0 lb

## 2013-08-15 DIAGNOSIS — Z800 Family history of malignant neoplasm of digestive organs: Secondary | ICD-10-CM

## 2013-08-15 DIAGNOSIS — D126 Benign neoplasm of colon, unspecified: Secondary | ICD-10-CM

## 2013-08-15 DIAGNOSIS — K5730 Diverticulosis of large intestine without perforation or abscess without bleeding: Secondary | ICD-10-CM

## 2013-08-15 DIAGNOSIS — Z86010 Personal history of colonic polyps: Secondary | ICD-10-CM

## 2013-08-15 DIAGNOSIS — Z1211 Encounter for screening for malignant neoplasm of colon: Secondary | ICD-10-CM

## 2013-08-15 MED ORDER — SODIUM CHLORIDE 0.9 % IV SOLN: 500.0000 mL | INTRAVENOUS | Status: DC

## 2013-08-15 NOTE — Progress Notes (Signed)
Called to room to assist during endoscopic procedure.  Patient ID and intended procedure confirmed with present staff. Received instructions for my participation in the procedure from the performing physician.  

## 2013-08-15 NOTE — Op Note (Signed)
[facility] [address] [place], [address]   COLONOSCOPY PROCEDURE REPORT  PATIENT: Saahas, [name]  MR#: [id number] BIRTHDATE: August 28, 1936 , 77  yrs. old GENDER: Male ENDOSCOPIST: Louis Meckel, MD REFERRED BY: PROCEDURE DATE:  08/15/2013 PROCEDURE:   Colonoscopy with cold biopsy polypectomy First Screening Colonoscopy - Avg.  risk and is 50 yrs.  old or older - No.  Prior Negative Screening - Now for repeat screening. Above average risk  History of Adenoma - Now for follow-up colonoscopy & has been > or = to 3 yrs.  N/A  Polyps Removed Today? Yes. ASA CLASS:   Class II INDICATIONS:Patient's immediate family history of colon cancer. MEDICATIONS: MAC sedation, administered by CRNA and Propofol (Diprivan) 130 mg IV  DESCRIPTION OF PROCEDURE:   After the risks benefits and alternatives of the procedure were thoroughly explained, informed consent was obtained.  A digital rectal exam revealed no abnormalities of the rectum.   The LB [id number]  endoscope was introduced through the anus and advanced to the cecum, which was identified by both the appendix and ileocecal valve. No adverse events experienced.   The quality of the prep was excellent using Suprep  The instrument was then slowly withdrawn as the colon was fully examined.      COLON FINDINGS: A sessile polyp measuring 2 mm in size was found in the ascending colon.  A polypectomy was performed with cold forceps.   Moderate diverticulosis was noted in the sigmoid colon. The colon was otherwise normal.  There was no diverticulosis, inflammation, polyps or cancers unless previously stated. Retroflexed views revealed no abnormalities. The time to cecum=3 minutes 28 seconds.  Withdrawal time=10 minutes 15 seconds.  The scope was withdrawn and the procedure completed. COMPLICATIONS: There were no complications.  ENDOSCOPIC IMPRESSION: 1.   Sessile polyp measuring 2 mm in size was found in the  ascending colon; polypectomy was performed with cold forceps 2.   Moderate diverticulosis was noted in the sigmoid colon 3.   The colon was otherwise normal  RECOMMENDATIONS: Given your age, you will not need another colonoscopy for colon cancer screening or polyp surveillance.  These types of tests usually stop around the age 47.   eSigned:  Louis Meckel, MD 08/15/2013 9:38 AM   cc: Pecola Lawless, MD   PATIENT NAME:  [name], [name] MR#: [id number]

## 2013-08-15 NOTE — Progress Notes (Signed)
No complaints noted in the recovery room. Maw   

## 2013-08-15 NOTE — Patient Instructions (Signed)
YOU HAD AN ENDOSCOPIC PROCEDURE TODAY AT THE [facility]: Refer to the procedure report that was given to you for any specific questions about what was found during the examination.  If the procedure report does not answer your questions, please call your gastroenterologist to clarify.  If you requested that your care partner not be given the details of your procedure findings, then the procedure report has been included in a sealed envelope for you to review at your convenience later.  YOU SHOULD EXPECT: Some feelings of bloating in the abdomen. Passage of more gas than usual.  Walking can help get rid of the air that was put into your GI tract during the procedure and reduce the bloating. If you had a lower endoscopy (such as a colonoscopy or flexible sigmoidoscopy) you may notice spotting of blood in your stool or on the toilet paper. If you underwent a bowel prep for your procedure, then you may not have a normal bowel movement for a few days.  DIET: Your first meal following the procedure should be a light meal and then it is ok to progress to your normal diet.  A half-sandwich or bowl of soup is an example of a good first meal.  Heavy or fried foods are harder to digest and may make you feel nauseous or bloated.  Likewise meals heavy in dairy and vegetables can cause extra gas to form and this can also increase the bloating.  Drink plenty of fluids but you should avoid alcoholic beverages for 24 hours.  ACTIVITY: Your care partner should take you home directly after the procedure.  You should plan to take it easy, moving slowly for the rest of the day.  You can resume normal activity the day after the procedure however you should NOT DRIVE or use heavy machinery for 24 hours (because of the sedation medicines used during the test).    SYMPTOMS TO REPORT IMMEDIATELY: A gastroenterologist can be reached at any hour.  During normal business hours, 8:30 AM to 5:00 PM Monday through Friday,  call [phone].  After hours and on weekends, please call the GI answering service at [phone] who will take a message and have the physician on call contact you.   Following lower endoscopy (colonoscopy or flexible sigmoidoscopy):  Excessive amounts of blood in the stool  Significant tenderness or worsening of abdominal pains  Swelling of the abdomen that is new, acute  Fever of 100F or higher   FOLLOW UP: If any biopsies were taken you will be contacted by phone or by letter within the next 1-3 weeks.  Call your gastroenterologist if you have not heard about the biopsies in 3 weeks.  Our staff will call the home number listed on your records the next business day following your procedure to check on you and address any questions or concerns that you may have at that time regarding the information given to you following your procedure. This is a courtesy call and so if there is no answer at the home number and we have not heard from you through the emergency physician on call, we will assume that you have returned to your regular daily activities without incident.  SIGNATURES/CONFIDENTIALITY: You and/or your care partner have signed paperwork which will be entered into your electronic medical record.  These signatures attest to the fact that that the information above on your After Visit Summary has been reviewed and is understood.  Full responsibility of the confidentiality of   this discharge information lies with you and/or your care-partner.   Handouts were given to your care partner on polyps, diverticulosis and a high fiber diet with liberal fluid intake. You may resume your current medications today. Please call if any questions or concerns.    

## 2013-08-15 NOTE — Progress Notes (Signed)
Lidocaine-40mg IV prior to Propofol InductionPropofol given over incremental dosages 

## 2013-08-16 ENCOUNTER — Telehealth: Payer: Self-pay | Admitting: *Deleted

## 2013-08-16 NOTE — Telephone Encounter (Signed)
  Follow up Call-  Call back number 08/15/2013  Post procedure Call Back phone  # [phone]  Permission to leave phone message Yes     Patient questions:  Do you have a fever, pain , or abdominal swelling? no Pain Score  0 *  Have you tolerated food without any problems? yes  Have you been able to return to your normal activities? yes  Do you have any questions about your discharge instructions: Diet   no Medications  no Follow up visit  no  Do you have questions or concerns about your Care? no  Actions: * If pain score is 4 or above: No action needed, pain <4.

## 2013-08-23 ENCOUNTER — Encounter: Payer: Self-pay | Admitting: Gastroenterology

## 2013-08-28 ENCOUNTER — Other Ambulatory Visit: Payer: Self-pay | Admitting: Internal Medicine

## 2013-08-28 NOTE — Telephone Encounter (Signed)
Metoprolol and Levothyroxine refilled per protocol

## 2013-09-01 ENCOUNTER — Ambulatory Visit (INDEPENDENT_AMBULATORY_CARE_PROVIDER_SITE_OTHER): Payer: Medicare Other | Admitting: Internal Medicine

## 2013-09-01 DIAGNOSIS — Z23 Encounter for immunization: Secondary | ICD-10-CM

## 2013-09-01 MED ORDER — TETANUS-DIPHTH-ACELL PERTUSSIS 5-2.5-18.5 LF-MCG/0.5 IM SUSP: 0.5000 mL | Freq: Once | INTRAMUSCULAR | Status: DC

## 2013-09-01 MED ORDER — PNEUMOCOCCAL VAC POLYVALENT 25 MCG/0.5ML IJ INJ: 0.5000 mL | INJECTION | INTRAMUSCULAR | Status: DC

## 2013-09-05 NOTE — Progress Notes (Signed)
   Subjective:    Patient ID: [name], male    DOB: April 15, 1936, 77 y.o.   MRN: [id number]  HPI   Here for immunizations only. No physician visit    Review of Systems     Objective:   Physical Exam        Assessment & Plan:

## 2013-09-29 ENCOUNTER — Ambulatory Visit (INDEPENDENT_AMBULATORY_CARE_PROVIDER_SITE_OTHER): Payer: Medicare Other

## 2013-09-29 DIAGNOSIS — Z2911 Encounter for prophylactic immunotherapy for respiratory syncytial virus (RSV): Secondary | ICD-10-CM

## 2013-09-29 DIAGNOSIS — Z23 Encounter for immunization: Secondary | ICD-10-CM

## 2013-10-01 ENCOUNTER — Other Ambulatory Visit: Payer: Self-pay | Admitting: Internal Medicine

## 2013-10-02 NOTE — Telephone Encounter (Signed)
Spironolactone refilled per protocol. Jg//CMA

## 2013-12-26 ENCOUNTER — Ambulatory Visit (INDEPENDENT_AMBULATORY_CARE_PROVIDER_SITE_OTHER): Payer: Medicare Other | Admitting: Cardiology

## 2013-12-26 ENCOUNTER — Encounter: Payer: Self-pay | Admitting: Cardiology

## 2013-12-26 VITALS — BP 112/66 | HR 66 | Ht 68.0 in | Wt 191.4 lb

## 2013-12-26 DIAGNOSIS — E782 Mixed hyperlipidemia: Secondary | ICD-10-CM

## 2013-12-26 DIAGNOSIS — I10 Essential (primary) hypertension: Secondary | ICD-10-CM

## 2013-12-26 DIAGNOSIS — I739 Peripheral vascular disease, unspecified: Secondary | ICD-10-CM

## 2013-12-26 DIAGNOSIS — I779 Disorder of arteries and arterioles, unspecified: Secondary | ICD-10-CM

## 2013-12-26 DIAGNOSIS — I2510 Atherosclerotic heart disease of native coronary artery without angina pectoris: Secondary | ICD-10-CM

## 2013-12-26 MED ORDER — DILTIAZEM HCL ER 180 MG PO CP24: 180.0000 mg | ORAL_CAPSULE | Freq: Every day | ORAL | Status: DC

## 2013-12-26 MED ORDER — METOPROLOL TARTRATE 25 MG PO TABS: ORAL_TABLET | ORAL | Status: DC

## 2013-12-26 MED ORDER — ATORVASTATIN CALCIUM 40 MG PO TABS: 40.0000 mg | ORAL_TABLET | Freq: Every day | ORAL | Status: DC

## 2013-12-26 MED ORDER — LEVOTHYROXINE SODIUM 150 MCG PO TABS: 150.0000 ug | ORAL_TABLET | Freq: Every day | ORAL | Status: DC

## 2013-12-26 MED ORDER — BENAZEPRIL HCL 20 MG PO TABS: ORAL_TABLET | ORAL | Status: DC

## 2013-12-26 MED ORDER — SPIRONOLACTONE 25 MG PO TABS: ORAL_TABLET | ORAL | Status: DC

## 2013-12-26 NOTE — Assessment & Plan Note (Signed)
Lipids are being treated. No change in therapy.

## 2013-12-26 NOTE — Assessment & Plan Note (Signed)
Blood pressure is controlled. No change in therapy. 

## 2013-12-26 NOTE — Patient Instructions (Signed)
**Note De-identified  Obfuscation** Your physician recommends that you continue on your current medications as directed. Please refer to the Current Medication list given to you today.  Your physician has requested that you have a carotid duplex. This test is an ultrasound of the carotid arteries in your neck. It looks at blood flow through these arteries that supply the brain with blood. Allow one hour for this exam. There are no restrictions or special instructions.  Your physician wants you to follow-up in: 1 year. You will receive a reminder letter in the mail two months in advance. If you don't receive a letter, please call our office to schedule the follow-up appointment.  

## 2013-12-26 NOTE — Assessment & Plan Note (Signed)
It will be time soon for his yearly followup carotid Doppler.

## 2013-12-26 NOTE — Assessment & Plan Note (Signed)
Coronary disease is stable. No further workup is needed. His aspirin dose can be lowered 81 mg.

## 2013-12-26 NOTE — Progress Notes (Signed)
Patient ID: [name], male   DOB: [birth year], 78 y.o.   MRN: [id number]    HPI  Patient is seen today to follow up coronary disease. I saw him last April, 2014. Around that time he had a carotid Doppler that was stable. It showed question of an abnormality in his thyroid. He had a thyroid scan. This showed an inhomogeneous thyroid but no discrete nodules. He's not having any chest pain.  Allergies  Allergen Reactions  . Amlodipine Besylate     REACTION: edema  . Codeine     constipation    Current Outpatient Prescriptions  Medication Sig Dispense Refill  . aspirin 325 MG tablet Take 325 mg by mouth daily.        Marland Kitchen atorvastatin (LIPITOR) 40 MG tablet Take 40 mg by mouth. 1/2 by mouth daily      . benazepril (LOTENSIN) 20 MG tablet TAKE ONE AND ONE HALF TABLETS BY MOUTH DAILY.  135 tablet  1  . diltiazem (DILACOR XR) 180 MG 24 hr capsule Take 1 capsule (180 mg total) by mouth daily.  90 capsule  3  . levothyroxine (SYNTHROID, LEVOTHROID) 150 MCG tablet TAKE 1 TABLET BY MOUTH EVERY DAY  90 tablet  1  . metoprolol tartrate (LOPRESSOR) 25 MG tablet TAKE 1/2 TABLET BY MOUTH TWICE DAILY  90 tablet  1  . nitroGLYCERIN (NITROSTAT) 0.4 MG SL tablet Place 1 tablet (0.4 mg total) under the tongue every 5 (five) minutes as needed.  25 tablet  11  . sildenafil (VIAGRA) 100 MG tablet 1/2-1 qd prn  5 tablet  11  . spironolactone (ALDACTONE) 25 MG tablet TAKE 1 TABLET BY MOUTH DAILY  90 tablet  1  . ZOSTAVAX 97353 UNT/0.65ML injection        No current facility-administered medications for this visit.    History   Social History  . Marital Status: Married    Spouse Name: N/A    Number of Children: N/A  . Years of Education: N/A   Occupational History  . retired    Social History Main Topics  . Smoking status: Former Smoker    Types: Cigarettes    Quit date: 09/21/1984  . Smokeless tobacco: Never Used     Comment: smoked 1958-1986, up to 1 ppd  . Alcohol Use: Yes     Comment:  socially on occasion  . Drug Use: No  . Sexual Activity: Not on file   Other Topics Concern  . Not on file   Social History Narrative  . No narrative on file    Family History  Problem Relation Age of Onset  . Coronary artery disease Mother     carotid endarterectomy bilaterally  . Colon cancer Father 2  . Hypertension Father   . Pancreatitis Father   . Diabetes Sister   . Diabetes Maternal Grandfather   . Heart attack Maternal Grandfather 70  . Heart attack Sister 67  . Thyroid disease Neg Hx   . Stroke Neg Hx     Past Medical History  Diagnosis Date  . Hypertension   . Hypothyroidism   . Hyperlipidemia   . BPH (benign prostatic hypertrophy)   . History of colonic polyps   . Skin cancer (melanoma)     PMH of   . Coronary artery disease     DES Circumflex or CVA 2006  /  clear, February, 2011, EF 70%, no ischemia or  . Venous insufficiency   . Carotid artery disease  Doppler, December 08, 2011, 00 16% R. ICA, 10-96% LICA, followup 1 year  . Tremor     Fine tremor right upper extremity  . Ejection fraction     EF 60%, echo, 2009, mildly calcified aortic leaflets  . Multiple thyroid nodules     Avascular echogenic areas noted in the right thyroid at the time of carotid Doppler.     Past Surgical History  Procedure Laterality Date  . Total hip arthroplasty  2007  . Tonsillectomy    . Toe surgery  1997  . Coronary angioplasty with stent placement  2006  . Colonoscopy w/ polypectomy  2009    Patient Active Problem List   Diagnosis Date Noted  . Multiple thyroid nodules   . Tremor   . Carotid artery disease   . Erectile dysfunction 05/31/2012  . Coronary artery disease   . Venous insufficiency   . FASTING HYPERGLYCEMIA 11/09/2008  . MELANOMA, HX OF 11/09/2008  . COLONIC POLYPS, HYPERPLASTIC 05/10/2008  . HYPOTHYROIDISM 08/10/2007  . HYPERLIPIDEMIA 08/10/2007  . HYPERTENSION, ESSENTIAL NOS 08/10/2007  . DEGENERATIVE JOINT DISEASE 08/08/2007  . MURMUR  08/08/2007  . BENIGN PROSTATIC HYPERTROPHY, HX OF 08/05/2007    ROS   Patient denies fever, chills, headache, sweats, rash, change in vision, change in hearing, chest pain, cough, nausea or vomiting, urinary symptoms. All other systems are reviewed and are negative.  PHYSICAL EXAM  Patient is oriented to person time and place. Affect is normal. He is here with his wife. There is no jugulovenous distention. Lungs are clear. Respiratory effort is nonlabored. Cardiac exam reveals S1 and S2. Abdomen is soft. There is no peripheral edema.  Filed Vitals:   12/26/13 1056  BP: 112/66  Pulse: 66  Height: 5\' 8"  (1.727 m)  Weight: 191 lb 6.4 oz (86.818 kg)   EKG is done today and reviewed by me. There is no change from the past. There is left axis.  ASSESSMENT & PLAN

## 2014-01-05 ENCOUNTER — Ambulatory Visit (HOSPITAL_COMMUNITY): Payer: Medicare Other | Attending: Cardiology | Admitting: Cardiology

## 2014-01-05 DIAGNOSIS — I779 Disorder of arteries and arterioles, unspecified: Secondary | ICD-10-CM

## 2014-01-05 DIAGNOSIS — I739 Peripheral vascular disease, unspecified: Secondary | ICD-10-CM

## 2014-01-05 DIAGNOSIS — I6529 Occlusion and stenosis of unspecified carotid artery: Secondary | ICD-10-CM

## 2014-01-05 NOTE — Progress Notes (Signed)
Carotid duplex completed 

## 2014-01-07 ENCOUNTER — Encounter: Payer: Self-pay | Admitting: Cardiology

## 2014-03-29 ENCOUNTER — Telehealth: Payer: Self-pay | Admitting: Cardiology

## 2014-03-29 NOTE — Telephone Encounter (Signed)
Received request from Nurse fax box, documents faxed for surgical clearance. To: [employer] Fax number: [phone] Attention: 7.9.15/km

## 2014-05-24 ENCOUNTER — Other Ambulatory Visit: Payer: Self-pay | Admitting: Orthopedic Surgery

## 2014-05-24 NOTE — Progress Notes (Signed)
Preoperative surgical orders have been place into the Epic hospital system for [name] on 05/24/2014, 1:12 PM  by Mickel Crow for surgery on 06/25/2014.  Preop Total Knee orders including Experal, IV Tylenol, and IV Decadron as long as there are no contraindications to the above medications. [name], PA-C

## 2014-06-11 ENCOUNTER — Encounter (HOSPITAL_COMMUNITY): Payer: Self-pay | Admitting: Pharmacy Technician

## 2014-06-15 ENCOUNTER — Other Ambulatory Visit (HOSPITAL_COMMUNITY): Payer: Self-pay | Admitting: *Deleted

## 2014-06-15 NOTE — Patient Instructions (Addendum)
[name]  06/15/2014                           YOUR PROCEDURE IS SCHEDULED ON: 06/25/14               ENTER THRU [facility] ENTRANCE AND                            FOLLOW  SIGNS TO SHORT STAY CENTER                 ARRIVE AT SHORT STAY AT: 7:30 AM               CALL THIS NUMBER IF ANY PROBLEMS THE DAY OF SURGERY :               [phone]                                REMEMBER:   Do not eat food or drink liquids AFTER MIDNIGHT                  Take these medicines the morning of surgery with               A SIPS OF WATER :   DILTIAZEM / LEVOTHYROXINE / METOPROLOL       Do not wear jewelry, make-up   Do not wear lotions, powders, or perfumes.   Do not shave legs or underarms 12 hrs. before surgery (men may shave face)  Do not bring valuables to the hospital.  Contacts, dentures or bridgework may not be worn into surgery.  Leave suitcase in the car. After surgery it may be brought to your room.  For patients admitted to the hospital more than one night, checkout time is            11:00 AM                                                       ________________________________________________________________________                                                                                                  [name]  Before surgery, you can play an important role.  Because skin is not sterile, your skin needs to be as free of germs as possible.  You can reduce the number of germs on your skin by washing with CHG (chlorahexidine gluconate) soap before surgery.  CHG is an antiseptic cleaner which kills germs and bonds with the skin to continue killing germs even after washing. Please DO NOT use if you have an allergy to CHG or antibacterial soaps.  If your skin becomes reddened/irritated stop using the CHG and inform your nurse  when you arrive at Short Stay. Do not shave (including legs and underarms) for at least 48  hours prior to the first CHG shower.  You may shave your face. Please follow these instructions carefully:   1.  Shower with CHG Soap the night before surgery and the  morning of Surgery.   2.  If you choose to wash your hair, wash your hair first as usual with your  normal  Shampoo.   3.  After you shampoo, rinse your hair and body thoroughly to remove the  shampoo.                                         4.  Use CHG as you would any other liquid soap.  You can apply chg directly  to the skin and wash . Gently wash with scrungie or clean wascloth    5.  Apply the CHG Soap to your body ONLY FROM THE NECK DOWN.   Do not use on open                           Wound or open sores. Avoid contact with eyes, ears mouth and genitals (private parts).                        Genitals (private parts) with your normal soap.              6.  Wash thoroughly, paying special attention to the area where your surgery  will be performed.   7.  Thoroughly rinse your body with warm water from the neck down.   8.  DO NOT shower/wash with your normal soap after using and rinsing off  the CHG Soap .                9.  Pat yourself dry with a clean towel.             10.  Wear clean pajamas.             11.  Place clean sheets on your bed the night of your first shower and do not  sleep with pets.  Day of Surgery : Do not apply any lotions/deodorants the morning of surgery.  Please wear clean clothes to the hospital/surgery center.  FAILURE TO FOLLOW THESE INSTRUCTIONS MAY RESULT IN THE CANCELLATION OF YOUR SURGERY    PATIENT SIGNATURE_________________________________  ______________________________________________________________________     [name]  An incentive spirometer is a tool that can help keep your lungs clear and active. This tool measures how well you are filling your lungs with each breath. Taking long deep breaths may help reverse or decrease the chance of developing  breathing (pulmonary) problems (especially infection) following:  A long period of time when you are unable to move or be active. BEFORE THE PROCEDURE   If the spirometer includes an indicator to show your best effort, your nurse or respiratory therapist will set it to a desired goal.  If possible, sit up straight or lean slightly forward. Try not to slouch.  Hold the incentive spirometer in an upright position. INSTRUCTIONS FOR USE  1. Sit on the edge of your bed if possible, or sit up as far as you can in bed or on a chair. 2. Hold the incentive spirometer in an  upright position. 3. Breathe out normally. 4. Place the mouthpiece in your mouth and seal your lips tightly around it. 5. Breathe in slowly and as deeply as possible, raising the piston or the ball toward the top of the column. 6. Hold your breath for 3-5 seconds or for as long as possible. Allow the piston or ball to fall to the bottom of the column. 7. Remove the mouthpiece from your mouth and breathe out normally. 8. Rest for a few seconds and repeat Steps 1 through 7 at least 10 times every 1-2 hours when you are awake. Take your time and take a few normal breaths between deep breaths. 9. The spirometer may include an indicator to show your best effort. Use the indicator as a goal to work toward during each repetition. 10. After each set of 10 deep breaths, practice coughing to be sure your lungs are clear. If you have an incision (the cut made at the time of surgery), support your incision when coughing by placing a pillow or rolled up towels firmly against it. Once you are able to get out of bed, walk around indoors and cough well. You may stop using the incentive spirometer when instructed by your caregiver.  RISKS AND COMPLICATIONS  Take your time so you do not get dizzy or light-headed.  If you are in pain, you may need to take or ask for pain medication before doing incentive spirometry. It is harder to take a deep  breath if you are having pain. AFTER USE  Rest and breathe slowly and easily.  It can be helpful to keep track of a log of your progress. Your caregiver can provide you with a simple table to help with this. If you are using the spirometer at home, follow these instructions: [place] IF:   You are having difficultly using the spirometer.  You have trouble using the spirometer as often as instructed.  Your pain medication is not giving enough relief while using the spirometer.  You develop fever of 100.5 F (38.1 C) or higher. SEEK IMMEDIATE MEDICAL CARE IF:   You cough up bloody sputum that had not been present before.  You develop fever of 102 F (38.9 C) or greater.  You develop worsening pain at or near the incision site. MAKE SURE YOU:   Understand these instructions.  Will watch your condition.  Will get help right away if you are not doing well or get worse. Document Released: 01/18/2007 Document Revised: 11/30/2011 Document Reviewed: 03/21/2007 ExitCare Patient Information 2014 ExitCare, [place].   ________________________________________________________________________  WHAT IS A BLOOD TRANSFUSION? Blood Transfusion Information  A transfusion is the replacement of blood or some of its parts. Blood is made up of multiple cells which provide different functions.  Red blood cells carry oxygen and are used for blood loss replacement.  White blood cells fight against infection.  Platelets control bleeding.  Plasma helps clot blood.  Other blood products are available for specialized needs, such as hemophilia or other clotting disorders. BEFORE THE TRANSFUSION  Who gives blood for transfusions?   Healthy volunteers who are fully evaluated to make sure their blood is safe. This is blood bank blood. Transfusion therapy is the safest it has ever been in the practice of medicine. Before blood is taken from a donor, a complete history is taken to make sure  that person has no history of diseases nor engages in risky social behavior (examples are intravenous drug use or sexual activity with  multiple partners). The donor's travel history is screened to minimize risk of transmitting infections, such as malaria. The donated blood is tested for signs of infectious diseases, such as HIV and hepatitis. The blood is then tested to be sure it is compatible with you in order to minimize the chance of a transfusion reaction. If you or a relative donates blood, this is often done in anticipation of surgery and is not appropriate for emergency situations. It takes many days to process the donated blood. RISKS AND COMPLICATIONS Although transfusion therapy is very safe and saves many lives, the main dangers of transfusion include:   Getting an infectious disease.  Developing a transfusion reaction. This is an allergic reaction to something in the blood you were given. Every precaution is taken to prevent this. The decision to have a blood transfusion has been considered carefully by your caregiver before blood is given. Blood is not given unless the benefits outweigh the risks. AFTER THE TRANSFUSION  Right after receiving a blood transfusion, you will usually feel much better and more energetic. This is especially true if your red blood cells have gotten low (anemic). The transfusion raises the level of the red blood cells which carry oxygen, and this usually causes an energy increase.  The nurse administering the transfusion will monitor you carefully for complications. HOME CARE INSTRUCTIONS  No special instructions are needed after a transfusion. You may find your energy is better. Speak with your caregiver about any limitations on activity for underlying diseases you may have. SEEK MEDICAL CARE IF:   Your condition is not improving after your transfusion.  You develop redness or irritation at the intravenous (IV) site. SEEK IMMEDIATE MEDICAL CARE IF:  Any of  the following symptoms occur over the next 12 hours:  Shaking chills.  You have a temperature by mouth above 102 F (38.9 C), not controlled by medicine.  Chest, back, or muscle pain.  People around you feel you are not acting correctly or are confused.  Shortness of breath or difficulty breathing.  Dizziness and fainting.  You get a rash or develop hives.  You have a decrease in urine output.  Your urine turns a dark color or changes to pink, red, or brown. Any of the following symptoms occur over the next 10 days:  You have a temperature by mouth above 102 F (38.9 C), not controlled by medicine.  Shortness of breath.  Weakness after normal activity.  The white part of the eye turns yellow (jaundice).  You have a decrease in the amount of urine or are urinating less often.  Your urine turns a dark color or changes to pink, red, or brown. Document Released: 09/04/2000 Document Revised: 11/30/2011 Document Reviewed: 04/23/2008 [facility] Patient Information 2014 [place], [place].  _______________________________________________________________________

## 2014-06-18 ENCOUNTER — Encounter (HOSPITAL_COMMUNITY)
Admission: RE | Admit: 2014-06-18 | Discharge: 2014-06-18 | Disposition: A | Discharge status: Home / Self Care | Payer: Medicare Other | Source: Ambulatory Visit | Attending: Orthopedic Surgery | Admitting: Orthopedic Surgery

## 2014-06-18 ENCOUNTER — Encounter: Payer: Self-pay | Admitting: Internal Medicine

## 2014-06-18 ENCOUNTER — Ambulatory Visit (HOSPITAL_COMMUNITY)
Admission: RE | Admit: 2014-06-18 | Discharge: 2014-06-18 | Disposition: A | Discharge status: Home / Self Care | Payer: Medicare Other | Source: Ambulatory Visit | Attending: Anesthesiology | Admitting: Anesthesiology

## 2014-06-18 ENCOUNTER — Encounter (HOSPITAL_COMMUNITY): Payer: Self-pay

## 2014-06-18 DIAGNOSIS — I10 Essential (primary) hypertension: Secondary | ICD-10-CM | POA: Insufficient documentation

## 2014-06-18 DIAGNOSIS — D126 Benign neoplasm of colon, unspecified: Secondary | ICD-10-CM | POA: Insufficient documentation

## 2014-06-18 DIAGNOSIS — Z01818 Encounter for other preprocedural examination: Secondary | ICD-10-CM | POA: Diagnosis not present

## 2014-06-18 DIAGNOSIS — G4730 Sleep apnea, unspecified: Secondary | ICD-10-CM | POA: Insufficient documentation

## 2014-06-18 HISTORY — DX: Unspecified osteoarthritis, unspecified site: M19.90

## 2014-06-18 LAB — SURGICAL PCR SCREEN
MRSA, PCR: NEGATIVE
Staphylococcus aureus: NEGATIVE

## 2014-06-18 LAB — CBC
HEMATOCRIT: 47.2 % (ref 39.0–52.0)
Hemoglobin: 15.6 g/dL (ref 13.0–17.0)
MCH: 30.8 pg (ref 26.0–34.0)
MCHC: 33.1 g/dL (ref 30.0–36.0)
MCV: 93.1 fL (ref 78.0–100.0)
PLATELETS: 255 10*3/uL (ref 150–400)
RBC: 5.07 MIL/uL (ref 4.22–5.81)
RDW: 14.4 % (ref 11.5–15.5)
WBC: 7.8 10*3/uL (ref 4.0–10.5)

## 2014-06-18 LAB — COMPREHENSIVE METABOLIC PANEL
ALT: 17 U/L (ref 0–53)
ANION GAP: 14 (ref 5–15)
AST: 25 U/L (ref 0–37)
Albumin: 4.2 g/dL (ref 3.5–5.2)
Alkaline Phosphatase: 74 U/L (ref 39–117)
BILIRUBIN TOTAL: 0.3 mg/dL (ref 0.3–1.2)
BUN: 18 mg/dL (ref 6–23)
CALCIUM: 9.7 mg/dL (ref 8.4–10.5)
CHLORIDE: 103 meq/L (ref 96–112)
CO2: 24 meq/L (ref 19–32)
Creatinine, Ser: 1.07 mg/dL (ref 0.50–1.35)
GFR calc non Af Amer: 65 mL/min — ABNORMAL LOW (ref >90)
GFR, EST AFRICAN AMERICAN: 76 mL/min — AB (ref >90)
GLUCOSE: 86 mg/dL (ref 70–99)
Potassium: 4.3 mEq/L (ref 3.7–5.3)
Sodium: 141 mEq/L (ref 137–147)
Total Protein: 7.4 g/dL (ref 6.0–8.3)

## 2014-06-18 LAB — URINALYSIS, ROUTINE W REFLEX MICROSCOPIC
Bilirubin Urine: NEGATIVE
Glucose, UA: NEGATIVE mg/dL
HGB URINE DIPSTICK: NEGATIVE
KETONES UR: NEGATIVE mg/dL
Leukocytes, UA: NEGATIVE
NITRITE: NEGATIVE
Protein, ur: NEGATIVE mg/dL
SPECIFIC GRAVITY, URINE: 1.005 (ref 1.005–1.030)
UROBILINOGEN UA: 0.2 mg/dL (ref 0.0–1.0)
pH: 6.5 (ref 5.0–8.0)

## 2014-06-18 LAB — PROTIME-INR
INR: 1.02 (ref 0.00–1.49)
PROTHROMBIN TIME: 13.4 s (ref 11.6–15.2)

## 2014-06-18 LAB — APTT: aPTT: 26 seconds (ref 24–37)

## 2014-06-18 NOTE — Progress Notes (Signed)
06/18/14 0920  OBSTRUCTIVE SLEEP APNEA  Have you ever been diagnosed with sleep apnea through a sleep study? No  Do you snore loudly (loud enough to be heard through closed doors)?  0  Do you often feel tired, fatigued, or sleepy during the daytime? 0  Has anyone observed you stop breathing during your sleep? 0  Do you have, or are you being treated for high blood pressure? 1  BMI more than 35 kg/m2? 0  Age over 76 years old? 1  Neck circumference greater than 40 cm/16 inches? 1  Gender: 1  Obstructive Sleep Apnea Score 4  Score 4 or greater  Results sent to PCP

## 2014-06-24 ENCOUNTER — Other Ambulatory Visit: Payer: Self-pay | Admitting: Orthopedic Surgery

## 2014-06-24 NOTE — H&P (Signed)
[name] DOB: 12-23-1935 Married / Language: English / Race: White Male Date of Admission:  06-25-2014 Chief Complaint:  Right Knee Pain History of Present Illness  The patient is a 78 year old male who comes in today for a preoperative History and Physical. The patient is scheduled for a right total knee arthroplasty to be performed by Dr. Dione Plover. Aluisio, MD at [facility] on 06/25/2014. The patient reports right knee symptoms including: pain and swelling which began month(s) ago without any known injury. The patient describes the severity of the symptoms as moderate in severity. The patient describes their pain as tightness.The patient feels that the symptoms are worsening. and include knee pain, swelling and decreased range of motion. The patient reports that symptoms radiate to the left thigh. Onset of symptoms was gradual. Symptoms are exacerbated by weight bearing. The patient is not currently being treated for this problem. The knee has gotten to the point where he wants to go ahead and get it replaced. He has had several aspirations of the knee performed in the past and continues to have pain and swelling.  Risks and benefits of the surgery have been discussed with the patient and they elect to proceed with surgery.  There are on active contraindications to upcoming procedure such as ongoing infection or progressive neurological disease.   Problem List/Past Medical Primary localized osteoarthritis of right knee (715.16  M17.11) Status post bilateral total hip replacement (V43.64  Z96.643) Osteoarthritis, Knee (715.96) High blood pressure Shingles Hypercholesterolemia Coronary Artery Disease/Heart Disease Measles Mumps  Allergies No Known Drug Allergies  Family History  Cancer father Congestive Heart Failure grandfather mothers side Osteoarthritis grandmother mothers side Diabetes Mellitus sister and grandfather mothers side Heart Disease  grandfather mothers side Hypertension sister  Social History Living situation live with spouse Illicit drug use no Number of flights of stairs before winded 2-3 Marital status married Drug/Alcohol Rehab (Currently) no Current work status retired Games developer weekly; does running / walking and gym / [employer] Drug/Alcohol Rehab (Previously) no Tobacco / smoke exposure no Pain Contract no Tobacco use Former smoker. former smoker; smoke(d) 1/2 pack(s) per day Alcohol use Occasional alcohol use. current drinker; drinks beer and hard liquor; only occasionally per week Children 2 Advance Directives Living Will, Healthcare POA Post-Surgical Plans Home  Medication History  Atorvastatin Calcium (40MG  Tablet, Oral) Active. Benazepril HCl (20MG  Tablet, Oral) Active. Diltiazem HCl CR (180MG  Capsule ER 24HR, Oral) Active. Levothyroxine Sodium (150MCG Tablet, Oral) Active. Metoprolol Tartrate (25MG  Tablet, Oral) Active. Spironolactone (25MG  Tablet, Oral) Active. Nitrostat (0.4MG  Tab Sublingual, Sublingual) Active. Amoxicillin (500MG  Capsule, Oral as needed) Active. (prn for dental procedures)  Past Surgical History Tonsillectomy Total Hip Replacement bilateral Heart Stents 2 Arthroscopy of Knee right Colon Polyp Removal - Colonoscopy   Review of Systems General Not Present- Chills, Fatigue, Fever, Memory Loss, Night Sweats, Weight Gain and Weight Loss. Skin Not Present- Eczema, Hives, Itching, Lesions and Rash. HEENT Not Present- Dentures, Double Vision, Headache, Hearing Loss, Tinnitus and Visual Loss. Respiratory Not Present- Allergies, Chronic Cough, Coughing up blood, Shortness of breath at rest and Shortness of breath with exertion. Cardiovascular Not Present- Chest Pain, Difficulty Breathing Lying Down, Murmur, Palpitations, Racing/skipping heartbeats and Swelling. Gastrointestinal Not Present- Abdominal Pain, Bloody Stool, Constipation, Diarrhea,  Difficulty Swallowing, Heartburn, Jaundice, Loss of appetitie, Nausea and Vomiting. Male Genitourinary Not Present- Blood in Urine, Discharge, Flank Pain, Incontinence, Painful Urination, Urgency, Urinary frequency, Urinary Retention, Urinating at Night and Weak urinary stream. Musculoskeletal Present-  Joint Pain, Joint Swelling and Morning Stiffness. Not Present- Back Pain, Muscle Pain, Muscle Weakness and Spasms. Neurological Not Present- Blackout spells, Difficulty with balance, Dizziness, Paralysis, Tremor and Weakness. Psychiatric Not Present- Insomnia.   Vitals Weight: 191 lb Height: 68in Body Surface Area: 2.04 m Body Mass Index: 29.04 kg/m BP: 114/62 (Sitting, Right Arm, Standard)    Physical Exam  General Mental Status -Alert, cooperative and good historian. General Appearance-pleasant, Not in acute distress. Orientation-Oriented X3. Build & Nutrition-Well nourished and Well developed.  Head and Neck Head-normocephalic, atraumatic . Neck Global Assessment - supple, no bruit auscultated on the right, no bruit auscultated on the left.  Eye Pupil - Bilateral-Regular and Round. Motion - Bilateral-EOMI.  Chest and Lung Exam Auscultation Breath sounds - clear at anterior chest wall and clear at posterior chest wall. Adventitious sounds - No Adventitious sounds.  Cardiovascular Auscultation Rhythm - Regular rate and rhythm. Heart Sounds - S1 WNL and S2 WNL. Murmurs & Other Heart Sounds - Auscultation of the heart reveals - No Murmurs.  Abdomen Palpation/Percussion Tenderness - Abdomen is non-tender to palpation. Rigidity (guarding) - Abdomen is soft. Auscultation Auscultation of the abdomen reveals - Bowel sounds normal.  Male Genitourinary Note: Not done, not pertinent to present illness   Musculoskeletal Note: On exam, well-developed male, in no distress. His right knee shows a moderate effusion. His range is 5-120. Marked crepitus on  ROM. Tenderness medial greater than lateral with no instability. His left hip can be flexed to 120, rotated in 30, out 40, abducted 40 without any pain on ROM of the hip. He is nontender in his greater trochanter.  RADIOGRAPHS: AP of both knees and lateral of the right show bone on bone arthritis in the medial and patellofemoral compartments of that right knee. He does have some arthritis in the left but nowhere near as bad as the right. AP pelvis and lateral of his hips show the hip prostheses in excellent position with no periprosthetic abnormalities.   Assessment & Plan Osteoarthritis of right knee (M17.9) Impression: Right Knee Note:Plan is for a Right Total Knee Replacement by Dr. Wynelle Link.  Plan is to go home.  Cards - Dr. Ron Parker - Patient has been seen preoperatively and felt to be stable for surgery.  The patient will receive topical TXA (tranexamic acid) due to: CAD  Signed electronically by Joelene Millin, III PA-C

## 2014-06-25 ENCOUNTER — Encounter (HOSPITAL_COMMUNITY): Payer: Medicare Other | Admitting: Certified Registered Nurse Anesthetist

## 2014-06-25 ENCOUNTER — Encounter (HOSPITAL_COMMUNITY)
Admission: RE | Disposition: A | Discharge status: Skilled Nursing Facility | Payer: Self-pay | Source: Ambulatory Visit | Attending: Orthopedic Surgery

## 2014-06-25 ENCOUNTER — Inpatient Hospital Stay (HOSPITAL_COMMUNITY): Payer: Medicare Other | Admitting: Certified Registered Nurse Anesthetist

## 2014-06-25 ENCOUNTER — Inpatient Hospital Stay (HOSPITAL_COMMUNITY)
Admission: RE | Admit: 2014-06-25 | Discharge: 2014-06-29 | DRG: 470 | Disposition: A | Discharge status: Skilled Nursing Facility | Payer: Medicare Other | Source: Ambulatory Visit | Attending: Orthopedic Surgery | Admitting: Orthopedic Surgery

## 2014-06-25 ENCOUNTER — Encounter (HOSPITAL_COMMUNITY): Payer: Self-pay | Admitting: *Deleted

## 2014-06-25 DIAGNOSIS — Z87891 Personal history of nicotine dependence: Secondary | ICD-10-CM | POA: Diagnosis not present

## 2014-06-25 DIAGNOSIS — E785 Hyperlipidemia, unspecified: Secondary | ICD-10-CM | POA: Diagnosis present

## 2014-06-25 DIAGNOSIS — Z96643 Presence of artificial hip joint, bilateral: Secondary | ICD-10-CM | POA: Diagnosis present

## 2014-06-25 DIAGNOSIS — Z79899 Other long term (current) drug therapy: Secondary | ICD-10-CM | POA: Diagnosis not present

## 2014-06-25 DIAGNOSIS — I10 Essential (primary) hypertension: Secondary | ICD-10-CM | POA: Diagnosis present

## 2014-06-25 DIAGNOSIS — Z86010 Personal history of colonic polyps: Secondary | ICD-10-CM | POA: Diagnosis not present

## 2014-06-25 DIAGNOSIS — Z809 Family history of malignant neoplasm, unspecified: Secondary | ICD-10-CM | POA: Diagnosis not present

## 2014-06-25 DIAGNOSIS — M179 Osteoarthritis of knee, unspecified: Secondary | ICD-10-CM | POA: Diagnosis present

## 2014-06-25 DIAGNOSIS — Z955 Presence of coronary angioplasty implant and graft: Secondary | ICD-10-CM | POA: Diagnosis not present

## 2014-06-25 DIAGNOSIS — N400 Enlarged prostate without lower urinary tract symptoms: Secondary | ICD-10-CM | POA: Diagnosis present

## 2014-06-25 DIAGNOSIS — Z85820 Personal history of malignant melanoma of skin: Secondary | ICD-10-CM | POA: Diagnosis not present

## 2014-06-25 DIAGNOSIS — E780 Pure hypercholesterolemia: Secondary | ICD-10-CM | POA: Diagnosis present

## 2014-06-25 DIAGNOSIS — E039 Hypothyroidism, unspecified: Secondary | ICD-10-CM | POA: Diagnosis present

## 2014-06-25 DIAGNOSIS — M1711 Unilateral primary osteoarthritis, right knee: Secondary | ICD-10-CM | POA: Diagnosis present

## 2014-06-25 DIAGNOSIS — Z6829 Body mass index (BMI) 29.0-29.9, adult: Secondary | ICD-10-CM

## 2014-06-25 DIAGNOSIS — Z8249 Family history of ischemic heart disease and other diseases of the circulatory system: Secondary | ICD-10-CM

## 2014-06-25 DIAGNOSIS — M1710 Unilateral primary osteoarthritis, unspecified knee: Secondary | ICD-10-CM | POA: Diagnosis present

## 2014-06-25 DIAGNOSIS — Z833 Family history of diabetes mellitus: Secondary | ICD-10-CM | POA: Diagnosis not present

## 2014-06-25 DIAGNOSIS — M25561 Pain in right knee: Secondary | ICD-10-CM | POA: Diagnosis present

## 2014-06-25 DIAGNOSIS — I2510 Atherosclerotic heart disease of native coronary artery without angina pectoris: Secondary | ICD-10-CM | POA: Diagnosis present

## 2014-06-25 HISTORY — PX: TOTAL KNEE ARTHROPLASTY: SHX125

## 2014-06-25 LAB — TYPE AND SCREEN
ABO/RH(D): O POS
Antibody Screen: NEGATIVE

## 2014-06-25 SURGERY — ARTHROPLASTY, KNEE, TOTAL
Anesthesia: Spinal | Site: Knee | Laterality: Right

## 2014-06-25 MED ORDER — ACETAMINOPHEN 500 MG PO TABS
1000.0000 mg | ORAL_TABLET | Freq: Four times a day (QID) | ORAL | Status: AC
  Administered 2014-06-25 – 2014-06-26 (×3): 1000 mg via ORAL
  Filled 2014-06-25 (×3): qty 2

## 2014-06-25 MED ORDER — FLEET ENEMA 7-19 GM/118ML RE ENEM: 1.0000 | ENEMA | Freq: Once | RECTAL | Status: AC | PRN

## 2014-06-25 MED ORDER — DEXAMETHASONE SODIUM PHOSPHATE 10 MG/ML IJ SOLN
10.0000 mg | Freq: Once | INTRAMUSCULAR | Status: DC
  Filled 2014-06-25: qty 1

## 2014-06-25 MED ORDER — LACTATED RINGERS IV SOLN: INTRAVENOUS | Status: DC

## 2014-06-25 MED ORDER — DOCUSATE SODIUM 100 MG PO CAPS
100.0000 mg | ORAL_CAPSULE | Freq: Two times a day (BID) | ORAL | Status: DC
  Administered 2014-06-25 – 2014-06-29 (×7): 100 mg via ORAL

## 2014-06-25 MED ORDER — PROPOFOL 10 MG/ML IV BOLUS
INTRAVENOUS | Status: AC
  Filled 2014-06-25: qty 20

## 2014-06-25 MED ORDER — EPHEDRINE SULFATE 50 MG/ML IJ SOLN
INTRAMUSCULAR | Status: DC | PRN
  Administered 2014-06-25 (×2): 10 mg via INTRAVENOUS
  Administered 2014-06-25 (×2): 5 mg via INTRAVENOUS
  Administered 2014-06-25: 10 mg via INTRAVENOUS

## 2014-06-25 MED ORDER — ONDANSETRON HCL 4 MG/2ML IJ SOLN: 4.0000 mg | Freq: Four times a day (QID) | INTRAMUSCULAR | Status: DC | PRN

## 2014-06-25 MED ORDER — FENTANYL CITRATE 0.05 MG/ML IJ SOLN
INTRAMUSCULAR | Status: DC | PRN
  Administered 2014-06-25: 50 ug via INTRAVENOUS

## 2014-06-25 MED ORDER — TRANEXAMIC ACID 100 MG/ML IV SOLN
2000.0000 mg | Freq: Once | INTRAVENOUS | Status: DC
  Filled 2014-06-25: qty 20

## 2014-06-25 MED ORDER — DIPHENHYDRAMINE HCL 12.5 MG/5ML PO ELIX: 12.5000 mg | ORAL_SOLUTION | ORAL | Status: DC | PRN

## 2014-06-25 MED ORDER — PHENOL 1.4 % MT LIQD: 1.0000 | OROMUCOSAL | Status: DC | PRN

## 2014-06-25 MED ORDER — PROPOFOL INFUSION 10 MG/ML OPTIME
INTRAVENOUS | Status: DC | PRN
  Administered 2014-06-25: 25 ug/kg/min via INTRAVENOUS

## 2014-06-25 MED ORDER — CHLORHEXIDINE GLUCONATE 4 % EX LIQD: 60.0000 mL | Freq: Once | CUTANEOUS | Status: DC

## 2014-06-25 MED ORDER — METHOCARBAMOL 500 MG PO TABS
500.0000 mg | ORAL_TABLET | Freq: Four times a day (QID) | ORAL | Status: DC | PRN
  Administered 2014-06-26 – 2014-06-29 (×2): 500 mg via ORAL
  Filled 2014-06-25 (×3): qty 1

## 2014-06-25 MED ORDER — TRAMADOL HCL 50 MG PO TABS
50.0000 mg | ORAL_TABLET | Freq: Four times a day (QID) | ORAL | Status: DC | PRN
  Administered 2014-06-29: 50 mg via ORAL
  Filled 2014-06-25: qty 1

## 2014-06-25 MED ORDER — MORPHINE SULFATE 2 MG/ML IJ SOLN
1.0000 mg | INTRAMUSCULAR | Status: DC | PRN
Start: 2014-06-25 — End: 2014-06-29

## 2014-06-25 MED ORDER — NITROGLYCERIN 0.4 MG SL SUBL: 0.4000 mg | SUBLINGUAL_TABLET | SUBLINGUAL | Status: DC | PRN

## 2014-06-25 MED ORDER — LACTATED RINGERS IV SOLN
INTRAVENOUS | Status: DC
  Administered 2014-06-25: 1000 mL via INTRAVENOUS
  Administered 2014-06-25 (×2): via INTRAVENOUS

## 2014-06-25 MED ORDER — METOPROLOL TARTRATE 12.5 MG HALF TABLET
12.5000 mg | ORAL_TABLET | Freq: Two times a day (BID) | ORAL | Status: DC
  Administered 2014-06-25 – 2014-06-29 (×8): 12.5 mg via ORAL
  Filled 2014-06-25 (×9): qty 1

## 2014-06-25 MED ORDER — DILTIAZEM HCL ER 180 MG PO CP24
180.0000 mg | ORAL_CAPSULE | Freq: Every morning | ORAL | Status: DC
  Administered 2014-06-27 – 2014-06-29 (×3): 180 mg via ORAL
  Filled 2014-06-25 (×4): qty 1

## 2014-06-25 MED ORDER — SODIUM CHLORIDE 0.9 % IJ SOLN
INTRAMUSCULAR | Status: DC | PRN
  Administered 2014-06-25: 30 mL

## 2014-06-25 MED ORDER — SODIUM CHLORIDE 0.9 % IJ SOLN
INTRAMUSCULAR | Status: AC
  Filled 2014-06-25: qty 50

## 2014-06-25 MED ORDER — ACETAMINOPHEN 10 MG/ML IV SOLN
1000.0000 mg | Freq: Once | INTRAVENOUS | Status: AC
  Administered 2014-06-25: 1000 mg via INTRAVENOUS
  Filled 2014-06-25: qty 100

## 2014-06-25 MED ORDER — FENTANYL CITRATE 0.05 MG/ML IJ SOLN
INTRAMUSCULAR | Status: AC
  Filled 2014-06-25: qty 2

## 2014-06-25 MED ORDER — BUPIVACAINE HCL 0.25 % IJ SOLN
INTRAMUSCULAR | Status: DC | PRN
  Administered 2014-06-25: 20 mL

## 2014-06-25 MED ORDER — MIDAZOLAM HCL 2 MG/2ML IJ SOLN
INTRAMUSCULAR | Status: AC
  Filled 2014-06-25: qty 2

## 2014-06-25 MED ORDER — ONDANSETRON HCL 4 MG/2ML IJ SOLN
INTRAMUSCULAR | Status: DC | PRN
  Administered 2014-06-25: 4 mg via INTRAVENOUS

## 2014-06-25 MED ORDER — CEFAZOLIN SODIUM-DEXTROSE 2-3 GM-% IV SOLR
2.0000 g | Freq: Four times a day (QID) | INTRAVENOUS | Status: AC
  Administered 2014-06-25 (×2): 2 g via INTRAVENOUS
  Filled 2014-06-25 (×2): qty 50

## 2014-06-25 MED ORDER — OXYCODONE HCL 5 MG PO TABS
5.0000 mg | ORAL_TABLET | ORAL | Status: DC | PRN
  Administered 2014-06-25: 5 mg via ORAL
  Administered 2014-06-26 – 2014-06-27 (×6): 10 mg via ORAL
  Filled 2014-06-25 (×2): qty 2
  Filled 2014-06-25 (×2): qty 1
  Filled 2014-06-25 (×2): qty 2
  Filled 2014-06-25: qty 1
  Filled 2014-06-25 (×2): qty 2

## 2014-06-25 MED ORDER — ACETAMINOPHEN 650 MG RE SUPP: 650.0000 mg | Freq: Four times a day (QID) | RECTAL | Status: DC | PRN

## 2014-06-25 MED ORDER — SODIUM CHLORIDE 0.9 % IV SOLN: INTRAVENOUS | Status: DC

## 2014-06-25 MED ORDER — TRANEXAMIC ACID 100 MG/ML IV SOLN
2000.0000 mg | INTRAVENOUS | Status: DC | PRN
  Administered 2014-06-25: 2000 mg via INTRAVENOUS

## 2014-06-25 MED ORDER — HYDROMORPHONE HCL 1 MG/ML IJ SOLN: 0.2500 mg | INTRAMUSCULAR | Status: DC | PRN

## 2014-06-25 MED ORDER — POLYETHYLENE GLYCOL 3350 17 G PO PACK
17.0000 g | PACK | Freq: Every day | ORAL | Status: DC | PRN
  Administered 2014-06-27: 17 g via ORAL

## 2014-06-25 MED ORDER — CEFAZOLIN SODIUM-DEXTROSE 2-3 GM-% IV SOLR
INTRAVENOUS | Status: AC
  Filled 2014-06-25: qty 50

## 2014-06-25 MED ORDER — ONDANSETRON HCL 4 MG PO TABS: 4.0000 mg | ORAL_TABLET | Freq: Four times a day (QID) | ORAL | Status: DC | PRN

## 2014-06-25 MED ORDER — LEVOTHYROXINE SODIUM 150 MCG PO TABS
150.0000 ug | ORAL_TABLET | Freq: Every day | ORAL | Status: DC
  Administered 2014-06-26 – 2014-06-29 (×4): 150 ug via ORAL
  Filled 2014-06-25 (×5): qty 1

## 2014-06-25 MED ORDER — BUPIVACAINE HCL (PF) 0.25 % IJ SOLN
INTRAMUSCULAR | Status: AC
  Filled 2014-06-25: qty 30

## 2014-06-25 MED ORDER — METHOCARBAMOL 1000 MG/10ML IJ SOLN
500.0000 mg | Freq: Four times a day (QID) | INTRAMUSCULAR | Status: DC | PRN
  Filled 2014-06-25: qty 5

## 2014-06-25 MED ORDER — ATORVASTATIN CALCIUM 20 MG PO TABS
20.0000 mg | ORAL_TABLET | Freq: Every day | ORAL | Status: DC
  Administered 2014-06-25 – 2014-06-28 (×4): 20 mg via ORAL
  Filled 2014-06-25 (×5): qty 1

## 2014-06-25 MED ORDER — GLYCOPYRROLATE 0.2 MG/ML IJ SOLN
INTRAMUSCULAR | Status: DC | PRN
  Administered 2014-06-25: 0.2 mg via INTRAVENOUS

## 2014-06-25 MED ORDER — BISACODYL 10 MG RE SUPP
10.0000 mg | Freq: Every day | RECTAL | Status: DC | PRN
  Administered 2014-06-27: 10 mg via RECTAL
  Filled 2014-06-25: qty 1

## 2014-06-25 MED ORDER — DEXTROSE-NACL 5-0.9 % IV SOLN
INTRAVENOUS | Status: DC
  Administered 2014-06-25 – 2014-06-26 (×2): via INTRAVENOUS

## 2014-06-25 MED ORDER — MENTHOL 3 MG MT LOZG: 1.0000 | LOZENGE | OROMUCOSAL | Status: DC | PRN

## 2014-06-25 MED ORDER — METOCLOPRAMIDE HCL 10 MG PO TABS: 5.0000 mg | ORAL_TABLET | Freq: Three times a day (TID) | ORAL | Status: DC | PRN

## 2014-06-25 MED ORDER — BUPIVACAINE LIPOSOME 1.3 % IJ SUSP
INTRAMUSCULAR | Status: DC | PRN
  Administered 2014-06-25: 20 mL

## 2014-06-25 MED ORDER — CEFAZOLIN SODIUM-DEXTROSE 2-3 GM-% IV SOLR
2.0000 g | INTRAVENOUS | Status: AC
  Administered 2014-06-25: 2 g via INTRAVENOUS

## 2014-06-25 MED ORDER — ACETAMINOPHEN 325 MG PO TABS: 650.0000 mg | ORAL_TABLET | Freq: Four times a day (QID) | ORAL | Status: DC | PRN

## 2014-06-25 MED ORDER — SPIRONOLACTONE 25 MG PO TABS
25.0000 mg | ORAL_TABLET | Freq: Every morning | ORAL | Status: DC
  Administered 2014-06-25 – 2014-06-29 (×5): 25 mg via ORAL
  Filled 2014-06-25 (×5): qty 1

## 2014-06-25 MED ORDER — RIVAROXABAN 10 MG PO TABS
10.0000 mg | ORAL_TABLET | Freq: Every day | ORAL | Status: DC
  Administered 2014-06-26 – 2014-06-29 (×4): 10 mg via ORAL
  Filled 2014-06-25 (×5): qty 1

## 2014-06-25 MED ORDER — BUPIVACAINE LIPOSOME 1.3 % IJ SUSP
20.0000 mL | Freq: Once | INTRAMUSCULAR | Status: DC
  Filled 2014-06-25: qty 20

## 2014-06-25 MED ORDER — MIDAZOLAM HCL 5 MG/5ML IJ SOLN
INTRAMUSCULAR | Status: DC | PRN
  Administered 2014-06-25: 2 mg via INTRAVENOUS

## 2014-06-25 MED ORDER — METOCLOPRAMIDE HCL 5 MG/ML IJ SOLN: 5.0000 mg | Freq: Three times a day (TID) | INTRAMUSCULAR | Status: DC | PRN

## 2014-06-25 MED ORDER — DEXAMETHASONE SODIUM PHOSPHATE 10 MG/ML IJ SOLN
10.0000 mg | Freq: Once | INTRAMUSCULAR | Status: AC
  Administered 2014-06-25: 10 mg via INTRAVENOUS

## 2014-06-25 MED ORDER — KETOROLAC TROMETHAMINE 15 MG/ML IJ SOLN
7.5000 mg | Freq: Four times a day (QID) | INTRAMUSCULAR | Status: AC | PRN
  Administered 2014-06-25: 7.5 mg via INTRAVENOUS
  Filled 2014-06-25: qty 1

## 2014-06-25 SURGICAL SUPPLY — 62 items
BAG SPEC THK2 15X12 ZIP CLS (MISCELLANEOUS) ×1
BAG ZIPLOCK 12X15 (MISCELLANEOUS) ×3 IMPLANT
BANDAGE ELASTIC 6 VELCRO ST LF (GAUZE/BANDAGES/DRESSINGS) ×3 IMPLANT
BANDAGE ESMARK 6X9 LF (GAUZE/BANDAGES/DRESSINGS) ×1 IMPLANT
BLADE SAG 18X100X1.27 (BLADE) ×3 IMPLANT
BLADE SAW SGTL 11.0X1.19X90.0M (BLADE) ×3 IMPLANT
BNDG CMPR 9X6 STRL LF SNTH (GAUZE/BANDAGES/DRESSINGS) ×1
BNDG ESMARK 6X9 LF (GAUZE/BANDAGES/DRESSINGS) ×3
BOWL SMART MIX CTS (DISPOSABLE) ×3 IMPLANT
CAPT RP KNEE ×2 IMPLANT
CEMENT HV SMART SET (Cement) ×4 IMPLANT
CLOSURE WOUND 1/2 X4 (GAUZE/BANDAGES/DRESSINGS) ×1
CUFF TOURN SGL QUICK 34 (TOURNIQUET CUFF) ×3
CUFF TRNQT CYL 34X4X40X1 (TOURNIQUET CUFF) ×1 IMPLANT
DECANTER SPIKE VIAL GLASS SM (MISCELLANEOUS) ×3 IMPLANT
DRAPE EXTREMITY TIBURON (DRAPES) ×3 IMPLANT
DRAPE POUCH INSTRU U-SHP 10X18 (DRAPES) ×3 IMPLANT
DRAPE U-SHAPE 47X51 STRL (DRAPES) ×3 IMPLANT
DRSG ADAPTIC 3X8 NADH LF (GAUZE/BANDAGES/DRESSINGS) ×3 IMPLANT
DRSG PAD ABDOMINAL 8X10 ST (GAUZE/BANDAGES/DRESSINGS) ×1 IMPLANT
DURAPREP 26ML APPLICATOR (WOUND CARE) ×3 IMPLANT
ELECT REM PT RETURN 9FT ADLT (ELECTROSURGICAL) ×3
ELECTRODE REM PT RTRN 9FT ADLT (ELECTROSURGICAL) ×1 IMPLANT
EVACUATOR 1/8 PVC DRAIN (DRAIN) ×3 IMPLANT
FACESHIELD WRAPAROUND (MASK) ×15 IMPLANT
FACESHIELD WRAPAROUND OR TEAM (MASK) ×5 IMPLANT
GAUZE SPONGE 4X4 12PLY STRL (GAUZE/BANDAGES/DRESSINGS) ×3 IMPLANT
GLOVE BIO SURGEON STRL SZ7.5 (GLOVE) IMPLANT
GLOVE BIO SURGEON STRL SZ8 (GLOVE) ×3 IMPLANT
GLOVE BIOGEL PI IND STRL 6.5 (GLOVE) IMPLANT
GLOVE BIOGEL PI IND STRL 8 (GLOVE) ×1 IMPLANT
GLOVE BIOGEL PI INDICATOR 6.5 (GLOVE)
GLOVE BIOGEL PI INDICATOR 8 (GLOVE) ×2
GLOVE SURG SS PI 6.5 STRL IVOR (GLOVE) IMPLANT
GOWN STRL REUS W/TWL LRG LVL3 (GOWN DISPOSABLE) ×3 IMPLANT
GOWN STRL REUS W/TWL XL LVL3 (GOWN DISPOSABLE) IMPLANT
HANDPIECE INTERPULSE COAX TIP (DISPOSABLE) ×3
IMMOBILIZER KNEE 20 (SOFTGOODS) ×2 IMPLANT
IMMOBILIZER KNEE 20 THIGH 36 (SOFTGOODS) ×1 IMPLANT
KIT BASIN OR (CUSTOM PROCEDURE TRAY) ×3 IMPLANT
MANIFOLD NEPTUNE II (INSTRUMENTS) ×3 IMPLANT
NDL SAFETY ECLIPSE 18X1.5 (NEEDLE) ×2 IMPLANT
NEEDLE HYPO 18GX1.5 SHARP (NEEDLE) ×6
NS IRRIG 1000ML POUR BTL (IV SOLUTION) ×3 IMPLANT
PACK TOTAL JOINT (CUSTOM PROCEDURE TRAY) ×3 IMPLANT
PAD ABD 8X10 STRL (GAUZE/BANDAGES/DRESSINGS) ×2 IMPLANT
PADDING CAST COTTON 6X4 STRL (CAST SUPPLIES) ×4 IMPLANT
POSITIONER SURGICAL ARM (MISCELLANEOUS) ×3 IMPLANT
SET HNDPC FAN SPRY TIP SCT (DISPOSABLE) ×1 IMPLANT
STRIP CLOSURE SKIN 1/2X4 (GAUZE/BANDAGES/DRESSINGS) ×3 IMPLANT
SUCTION FRAZIER 12FR DISP (SUCTIONS) ×3 IMPLANT
SUT MNCRL AB 4-0 PS2 18 (SUTURE) ×3 IMPLANT
SUT VIC AB 2-0 CT1 27 (SUTURE) ×9
SUT VIC AB 2-0 CT1 TAPERPNT 27 (SUTURE) ×3 IMPLANT
SUT VLOC 180 0 24IN GS25 (SUTURE) ×3 IMPLANT
SYR 20CC LL (SYRINGE) ×3 IMPLANT
SYR 50ML LL SCALE MARK (SYRINGE) ×3 IMPLANT
TOWEL OR 17X26 10 PK STRL BLUE (TOWEL DISPOSABLE) ×3 IMPLANT
TOWEL OR NON WOVEN STRL DISP B (DISPOSABLE) IMPLANT
TRAY FOLEY CATH 14FRSI W/METER (CATHETERS) ×1 IMPLANT
WATER STERILE IRR 1500ML POUR (IV SOLUTION) ×3 IMPLANT
WRAP KNEE MAXI GEL POST OP (GAUZE/BANDAGES/DRESSINGS) ×3 IMPLANT

## 2014-06-25 NOTE — Anesthesia Procedure Notes (Signed)
Spinal  Patient location during procedure: OR Start time: 06/25/2014 10:35 AM End time: 06/25/2014 10:39 AM Staffing CRNA/Resident: Anne Fu Performed by: resident/CRNA  Preanesthetic Checklist Completed: patient identified, site marked, surgical consent, pre-op evaluation, timeout performed, IV checked, risks and benefits discussed and monitors and equipment checked Spinal Block Patient position: sitting Prep: Betadine Patient monitoring: heart rate, continuous pulse ox and blood pressure Approach: right paramedian Location: L2-3 Injection technique: single-shot Needle Needle type: Spinocan  Needle gauge: 22 G Needle length: 9 cm Assessment Sensory level: T6 Additional Notes Expiration date of kit checked and confirmed. Patient tolerated procedure well, without complications. X 1 attempt with noted clear CSF return easy aspiration and administration. Note loss of sensory and motor.

## 2014-06-25 NOTE — Interval H&P Note (Signed)
History and Physical Interval Note:  06/25/2014 9:39 AM  [name]  has presented today for surgery, with the diagnosis of right knee osteoarthritis  The various methods of treatment have been discussed with the patient and family. After consideration of risks, benefits and other options for treatment, the patient has consented to  Procedure(s): RIGHT TOTAL KNEE ARTHROPLASTY (Right) as a surgical intervention .  The patient's history has been reviewed, patient examined, no change in status, stable for surgery.  I have reviewed the patient's chart and labs.  Questions were answered to the patient's satisfaction.     Gearlean Alf

## 2014-06-25 NOTE — H&P (View-Only) (Signed)
[name] DOB: Jan 12, 1936 Married / Language: English / Race: White Male Date of Admission:  06-25-2014 Chief Complaint:  Right Knee Pain History of Present Illness  The patient is a 78 year old male who comes in today for a preoperative History and Physical. The patient is scheduled for a right total knee arthroplasty to be performed by Dr. Dione Plover. Aluisio, MD at [facility] on 06/25/2014. The patient reports right knee symptoms including: pain and swelling which began month(s) ago without any known injury. The patient describes the severity of the symptoms as moderate in severity. The patient describes their pain as tightness.The patient feels that the symptoms are worsening. and include knee pain, swelling and decreased range of motion. The patient reports that symptoms radiate to the left thigh. Onset of symptoms was gradual. Symptoms are exacerbated by weight bearing. The patient is not currently being treated for this problem. The knee has gotten to the point where he wants to go ahead and get it replaced. He has had several aspirations of the knee performed in the past and continues to have pain and swelling.  Risks and benefits of the surgery have been discussed with the patient and they elect to proceed with surgery.  There are on active contraindications to upcoming procedure such as ongoing infection or progressive neurological disease.   Problem List/Past Medical Primary localized osteoarthritis of right knee (715.16  M17.11) Status post bilateral total hip replacement (V43.64  Z96.643) Osteoarthritis, Knee (715.96) High blood pressure Shingles Hypercholesterolemia Coronary Artery Disease/Heart Disease Measles Mumps  Allergies No Known Drug Allergies  Family History  Cancer father Congestive Heart Failure grandfather mothers side Osteoarthritis grandmother mothers side Diabetes Mellitus sister and grandfather mothers side Heart Disease  grandfather mothers side Hypertension sister  Social History Living situation live with spouse Illicit drug use no Number of flights of stairs before winded 2-3 Marital status married Drug/Alcohol Rehab (Currently) no Current work status retired Games developer weekly; does running / walking and gym / [employer] Drug/Alcohol Rehab (Previously) no Tobacco / smoke exposure no Pain Contract no Tobacco use Former smoker. former smoker; smoke(d) 1/2 pack(s) per day Alcohol use Occasional alcohol use. current drinker; drinks beer and hard liquor; only occasionally per week Children 2 Advance Directives Living Will, Healthcare POA Post-Surgical Plans Home  Medication History  Atorvastatin Calcium (40MG  Tablet, Oral) Active. Benazepril HCl (20MG  Tablet, Oral) Active. Diltiazem HCl CR (180MG  Capsule ER 24HR, Oral) Active. Levothyroxine Sodium (150MCG Tablet, Oral) Active. Metoprolol Tartrate (25MG  Tablet, Oral) Active. Spironolactone (25MG  Tablet, Oral) Active. Nitrostat (0.4MG  Tab Sublingual, Sublingual) Active. Amoxicillin (500MG  Capsule, Oral as needed) Active. (prn for dental procedures)  Past Surgical History Tonsillectomy Total Hip Replacement bilateral Heart Stents 2 Arthroscopy of Knee right Colon Polyp Removal - Colonoscopy   Review of Systems General Not Present- Chills, Fatigue, Fever, Memory Loss, Night Sweats, Weight Gain and Weight Loss. Skin Not Present- Eczema, Hives, Itching, Lesions and Rash. HEENT Not Present- Dentures, Double Vision, Headache, Hearing Loss, Tinnitus and Visual Loss. Respiratory Not Present- Allergies, Chronic Cough, Coughing up blood, Shortness of breath at rest and Shortness of breath with exertion. Cardiovascular Not Present- Chest Pain, Difficulty Breathing Lying Down, Murmur, Palpitations, Racing/skipping heartbeats and Swelling. Gastrointestinal Not Present- Abdominal Pain, Bloody Stool, Constipation, Diarrhea,  Difficulty Swallowing, Heartburn, Jaundice, Loss of appetitie, Nausea and Vomiting. Male Genitourinary Not Present- Blood in Urine, Discharge, Flank Pain, Incontinence, Painful Urination, Urgency, Urinary frequency, Urinary Retention, Urinating at Night and Weak urinary stream. Musculoskeletal Present-  Joint Pain, Joint Swelling and Morning Stiffness. Not Present- Back Pain, Muscle Pain, Muscle Weakness and Spasms. Neurological Not Present- Blackout spells, Difficulty with balance, Dizziness, Paralysis, Tremor and Weakness. Psychiatric Not Present- Insomnia.   Vitals Weight: 191 lb Height: 68in Body Surface Area: 2.04 m Body Mass Index: 29.04 kg/m BP: 114/62 (Sitting, Right Arm, Standard)    Physical Exam  General Mental Status -Alert, cooperative and good historian. General Appearance-pleasant, Not in acute distress. Orientation-Oriented X3. Build & Nutrition-Well nourished and Well developed.  Head and Neck Head-normocephalic, atraumatic . Neck Global Assessment - supple, no bruit auscultated on the right, no bruit auscultated on the left.  Eye Pupil - Bilateral-Regular and Round. Motion - Bilateral-EOMI.  Chest and Lung Exam Auscultation Breath sounds - clear at anterior chest wall and clear at posterior chest wall. Adventitious sounds - No Adventitious sounds.  Cardiovascular Auscultation Rhythm - Regular rate and rhythm. Heart Sounds - S1 WNL and S2 WNL. Murmurs & Other Heart Sounds - Auscultation of the heart reveals - No Murmurs.  Abdomen Palpation/Percussion Tenderness - Abdomen is non-tender to palpation. Rigidity (guarding) - Abdomen is soft. Auscultation Auscultation of the abdomen reveals - Bowel sounds normal.  Male Genitourinary Note: Not done, not pertinent to present illness   Musculoskeletal Note: On exam, well-developed male, in no distress. His right knee shows a moderate effusion. His range is 5-120. Marked crepitus on  ROM. Tenderness medial greater than lateral with no instability. His left hip can be flexed to 120, rotated in 30, out 40, abducted 40 without any pain on ROM of the hip. He is nontender in his greater trochanter.  RADIOGRAPHS: AP of both knees and lateral of the right show bone on bone arthritis in the medial and patellofemoral compartments of that right knee. He does have some arthritis in the left but nowhere near as bad as the right. AP pelvis and lateral of his hips show the hip prostheses in excellent position with no periprosthetic abnormalities.   Assessment & Plan Osteoarthritis of right knee (M17.9) Impression: Right Knee Note:Plan is for a Right Total Knee Replacement by Dr. Wynelle Link.  Plan is to go home.  Cards - Dr. Ron Parker - Patient has been seen preoperatively and felt to be stable for surgery.  The patient will receive topical TXA (tranexamic acid) due to: CAD  Signed electronically by Joelene Millin, III PA-C

## 2014-06-25 NOTE — Op Note (Signed)
Pre-operative diagnosis- Osteoarthritis  Right knee(s)  Post-operative diagnosis- Osteoarthritis Right knee(s)  Procedure-  Right  Total Knee Arthroplasty  Surgeon- [name] Plover. Harlen Danford, MD  Assistant- [name], PA-C   Anesthesia-  Spinal  EBL-* No blood loss amount entered *   Drains Hemovac  Tourniquet time-  Total Tourniquet Time Documented: Thigh (Right) - 33 minutes Total: Thigh (Right) - 33 minutes     Complications- None  Condition-PACU - hemodynamically stable.   Brief Clinical Note  [name] is a 78 y.o. year old male with end stage OA of his right knee with progressively worsening pain and dysfunction. He has constant pain, with activity and at rest and significant functional deficits with difficulties even with ADLs. He has had extensive non-op management including analgesics, injections of cortisone and viscosupplements, and home exercise program, but remains in significant pain with significant dysfunction. Radiographs show bone on bone arthritis lateral and patellofemoral. He presents now for rightt Total Knee Arthroplasty.    Procedure in detail---   The patient is brought into the operating room and positioned supine on the operating table. After successful administration of  Spinal,   a tourniquet is placed high on the  Right thigh(s) and the lower extremity is prepped and draped in the usual sterile fashion. Time out is performed by the operating team and then the  Right lower extremity is wrapped in Esmarch, knee flexed and the tourniquet inflated to 300 mmHg.       A midline incision is made with a ten blade through the subcutaneous tissue to the level of the extensor mechanism. A fresh blade is used to make a medial parapatellar arthrotomy. Soft tissue over the proximal medial tibia is subperiosteally elevated to the joint line with a knife and into the semimembranosus bursa with a Cobb elevator. Soft tissue over the proximal lateral tibia is elevated with  attention being paid to avoiding the patellar tendon on the tibial tubercle. The patella is everted, knee flexed 90 degrees and the ACL and PCL are removed. Findings are bone on bone lateral and patellofemoral with large global osteophytes.        The drill is used to create a starting hole in the distal femur and the canal is thoroughly irrigated with sterile saline to remove the fatty contents. The 5 degree Right  valgus alignment guide is placed into the femoral canal and the distal femoral cutting block is pinned to remove 10 mm off the distal femur. Resection is made with an oscillating saw.      The tibia is subluxed forward and the menisci are removed. The extramedullary alignment guide is placed referencing proximally at the medial aspect of the tibial tubercle and distally along the second metatarsal axis and tibial crest. The block is pinned to remove 82mm off the more deficient lateral  side. Resection is made with an oscillating saw. Size 4is the most appropriate size for the tibia and the proximal tibia is prepared with the modular drill and keel punch for that size.      The femoral sizing guide is placed and size 4 is most appropriate. Rotation is marked off the epicondylar axis and confirmed by creating a rectangular flexion gap at 90 degrees. The size 4 cutting block is pinned in this rotation and the anterior, posterior and chamfer cuts are made with the oscillating saw. The intercondylar block is then placed and that cut is made.      Trial size 4 tibial component,  trial size 4 posterior stabilized femur and a 10  mm posterior stabilized rotating platform insert trial is placed. Full extension is achieved with excellent varus/valgus and anterior/posterior balance throughout full range of motion. The patella is everted and thickness measured to be 25  mm. Free hand resection is taken to 14 mm, a 41 template is placed, lug holes are drilled, trial patella is placed, and it tracks normally.  Osteophytes are removed off the posterior femur with the trial in place. All trials are removed and the cut bone surfaces prepared with pulsatile lavage. Cement is mixed and once ready for implantation, the size 4 tibial implant, size  4 posterior stabilized femoral component, and the size 41 patella are cemented in place and the patella is held with the clamp. The trial insert is placed and the knee held in full extension. The Exparel (20 ml mixed with 30 ml saline) and .25% Bupivicaine, are injected into the extensor mechanism, posterior capsule, medial and lateral gutters and subcutaneous tissues.  All extruded cement is removed and once the cement is hard the permanent 10 mm posterior stabilized rotating platform insert is placed into the tibial tray.      The wound is copiously irrigated with saline solution and the extensor mechanism closed over a hemovac drain with #1 V-loc suture. The tourniquet is released for a total tourniquet time of 33  minutes. Flexion against gravity is 140 degrees and the patella tracks normally. Subcutaneous tissue is closed with 2.0 vicryl and subcuticular with running 4.0 Monocryl. The incision is cleaned and dried and steri-strips and a bulky sterile dressing are applied. The limb is placed into a knee immobilizer and the patient is awakened and transported to recovery in stable condition.      Please note that a surgical assistant was a medical necessity for this procedure in order to perform it in a safe and expeditious manner. Surgical assistant was necessary to retract the ligaments and vital neurovascular structures to prevent injury to them and also necessary for proper positioning of the limb to allow for anatomic placement of the prosthesis.   [name] Plover [name], MD    06/25/2014, 11:35 AM

## 2014-06-25 NOTE — Evaluation (Signed)
Physical Therapy Evaluation Patient Details Name: [name] MRN: [id number] DOB: August 16, 1936 Today's Date: 06/25/2014   History of Present Illness  s/p R TKA  Clinical Impression  Pt will benefit from PT to address deficits below; Plan is for HHPT, has RW; Expect excellent progress.    Follow Up Recommendations Home health PT    Equipment Recommendations  None recommended by PT    Recommendations for Other Services       Precautions / Restrictions Precautions Required Braces or Orthoses: Knee Immobilizer - Right Knee Immobilizer - Right: Discontinue once straight leg raise with < 10 degree lag Restrictions Other Position/Activity Restrictions: WBAT      Mobility  Bed Mobility Overal bed mobility: Needs Assistance Bed Mobility: Supine to Sit     Supine to sit: Min assist     General bed mobility comments: incr time, cues for technique  Transfers Overall transfer level: Needs assistance Equipment used: Rolling walker (2 wheeled) Transfers: Sit to/from Stand Sit to Stand: Min assist         General transfer comment: cues for hand placement  Ambulation/Gait Ambulation/Gait assistance: Min assist;Min guard Ambulation Distance (Feet): 60 Feet Assistive device: Rolling walker (2 wheeled) Gait Pattern/deviations: Step-to pattern;Antalgic;Decreased step length - right;Decreased step length - left     General Gait Details: cues for sequence, upward gaze  Stairs            Wheelchair Mobility    Modified Rankin (Stroke Patients Only)       Balance                                             Pertinent Vitals/Pain Pain Assessment: 0-10 Pain Score: 3  Pain Location: right knee Pain Intervention(s): Monitored during session;Premedicated before session;Ice applied    Home Living Family/patient expects to be discharged to:: Private residence Living Arrangements: Spouse/significant other Available Help at Discharge:  Family Type of Home: House Home Access: Stairs to enter Entrance Stairs-Rails: Right Entrance Stairs-Number of Steps: 3.5 Home Layout: One level Home Equipment: Environmental consultant - 2 wheels;Cane - single point      Prior Function Level of Independence: Independent               Hand Dominance        Extremity/Trunk Assessment   Upper Extremity Assessment: Overall WFL for tasks assessed;Defer to OT evaluation           Lower Extremity Assessment: RLE deficits/detail RLE Deficits / Details: able to do SLR, knee flexion to ~45*, ankle grossly WFL but positions in inversion       Communication   Communication: No difficulties  Cognition Arousal/Alertness: Awake/alert Behavior During Therapy: WFL for tasks assessed/performed Overall Cognitive Status: Within Functional Limits for tasks assessed                      General Comments      Exercises Total Joint Exercises Ankle Circles/Pumps: AROM;Both;10 reps Quad Sets: AROM;5 reps;Both      Assessment/Plan    PT Assessment Patient needs continued PT services  PT Diagnosis Difficulty walking   PT Problem List Decreased strength;Decreased range of motion;Decreased activity tolerance;Decreased mobility;Decreased knowledge of use of DME  PT Treatment Interventions DME instruction;Gait training;Functional mobility training;Therapeutic activities;Therapeutic exercise;Patient/family education   PT Goals (Current goals can be found in the Care Plan section) Acute  Rehab PT Goals Patient Stated Goal: return to PLOF, less knee pain PT Goal Formulation: With patient Time For Goal Achievement: 06/28/14 Potential to Achieve Goals: Good    Frequency 7X/week   Barriers to discharge        Co-evaluation               End of Session Equipment Utilized During Treatment: Gait belt Activity Tolerance: Patient tolerated treatment well Patient left: in chair;with family/visitor present;with call bell/phone within  reach Nurse Communication: Mobility status         Time: 2549-8264 PT Time Calculation (min): 19 min   Charges:   PT Evaluation $Initial PT Evaluation Tier I: 1 Procedure PT Treatments $Gait Training: 8-22 mins   PT G CodesKenyon Ana, PT Pager: [phone] 06/25/2014   [facility] 06/25/2014, 5:31 PM

## 2014-06-25 NOTE — Plan of Care (Signed)
Problem: Consults Goal: Total Joint Replacement Patient Education See Patient Education Module for education specifics. Right total knee Goal: Diagnosis- Total Joint Replacement Right total knee

## 2014-06-25 NOTE — Anesthesia Preprocedure Evaluation (Addendum)
Anesthesia Evaluation  Patient identified by MRN, date of birth, ID band Patient awake    Reviewed: Allergy & Precautions, H&P , NPO status , Patient's Chart, lab work & pertinent test results  Airway Mallampati: II TM Distance: >3 FB Neck ROM: full    Dental no notable dental hx. (+) Teeth Intact, Dental Advisory Given   Pulmonary neg pulmonary ROS, former smoker,  breath sounds clear to auscultation  Pulmonary exam normal       Cardiovascular Exercise Tolerance: Good hypertension, Pt. on home beta blockers and Pt. on medications + CAD and + Cardiac Stents negative cardio ROS  Rhythm:regular Rate:Normal     Neuro/Psych Carotid artery disease negative neurological ROS  negative psych ROS   GI/Hepatic negative GI ROS, Neg liver ROS,   Endo/Other  negative endocrine ROSHypothyroidism   Renal/GU negative Renal ROS  negative genitourinary   Musculoskeletal   Abdominal   Peds  Hematology negative hematology ROS (+)   Anesthesia Other Findings   Reproductive/Obstetrics negative OB ROS                           Anesthesia Physical Anesthesia Plan  ASA: III  Anesthesia Plan: Spinal   Post-op Pain Management:    Induction:   Airway Management Planned: Simple Face Mask  Additional Equipment:   Intra-op Plan:   Post-operative Plan:   Informed Consent: I have reviewed the patients History and Physical, chart, labs and discussed the procedure including the risks, benefits and alternatives for the proposed anesthesia with the patient or authorized representative who has indicated his/her understanding and acceptance.   Dental Advisory Given  Plan Discussed with: CRNA and Surgeon  Anesthesia Plan Comments:         Anesthesia Quick Evaluation

## 2014-06-25 NOTE — Anesthesia Postprocedure Evaluation (Signed)
  Anesthesia Post-op Note  Patient: [name]  Procedure(s) Performed: Procedure(s) (LRB): RIGHT TOTAL KNEE ARTHROPLASTY (Right)  Patient Location: PACU  Anesthesia Type: Spinal  Level of Consciousness: awake and alert   Airway and Oxygen Therapy: Patient Spontanous Breathing  Post-op Pain: mild  Post-op Assessment: Post-op Vital signs reviewed, Patient's Cardiovascular Status Stable, Respiratory Function Stable, Patent Airway and No signs of Nausea or vomiting  Last Vitals:  Filed Vitals:   06/25/14 1315  BP: 94/49  Pulse: 45  Temp: 36.3 C  Resp: 14    Post-op Vital Signs: stable   Complications: No apparent anesthesia complications

## 2014-06-25 NOTE — Transfer of Care (Signed)
Immediate Anesthesia Transfer of Care Note  Patient: [name]  Procedure(s) Performed: Procedure(s): RIGHT TOTAL KNEE ARTHROPLASTY (Right)  Patient Location: PACU  Anesthesia Type:Spinal  Level of Consciousness: sedated  Airway & Oxygen Therapy: Patient Spontanous Breathing and Patient connected to face mask oxygen  Post-op Assessment: Report given to PACU RN and Post -op Vital signs reviewed and stable  Post vital signs: Reviewed and stable  Complications: No apparent anesthesia complications

## 2014-06-26 LAB — BASIC METABOLIC PANEL
ANION GAP: 12 (ref 5–15)
BUN: 14 mg/dL (ref 6–23)
CHLORIDE: 101 meq/L (ref 96–112)
CO2: 25 meq/L (ref 19–32)
Calcium: 8.5 mg/dL (ref 8.4–10.5)
Creatinine, Ser: 0.92 mg/dL (ref 0.50–1.35)
GFR calc non Af Amer: 80 mL/min — ABNORMAL LOW (ref >90)
Glucose, Bld: 132 mg/dL — ABNORMAL HIGH (ref 70–99)
POTASSIUM: 4 meq/L (ref 3.7–5.3)
SODIUM: 138 meq/L (ref 137–147)

## 2014-06-26 LAB — CBC
HCT: 39.3 % (ref 39.0–52.0)
Hemoglobin: 13.2 g/dL (ref 13.0–17.0)
MCH: 30.8 pg (ref 26.0–34.0)
MCHC: 33.6 g/dL (ref 30.0–36.0)
MCV: 91.6 fL (ref 78.0–100.0)
PLATELETS: 217 10*3/uL (ref 150–400)
RBC: 4.29 MIL/uL (ref 4.22–5.81)
RDW: 14 % (ref 11.5–15.5)
WBC: 8.6 10*3/uL (ref 4.0–10.5)

## 2014-06-26 MED ORDER — TRAMADOL HCL 50 MG PO TABS: 50.0000 mg | ORAL_TABLET | Freq: Four times a day (QID) | ORAL | Status: DC | PRN

## 2014-06-26 MED ORDER — METHOCARBAMOL 500 MG PO TABS: 500.0000 mg | ORAL_TABLET | Freq: Four times a day (QID) | ORAL | Status: DC | PRN

## 2014-06-26 MED ORDER — OXYCODONE HCL 5 MG PO TABS: 5.0000 mg | ORAL_TABLET | ORAL | Status: DC | PRN

## 2014-06-26 MED ORDER — RIVAROXABAN 10 MG PO TABS: 10.0000 mg | ORAL_TABLET | Freq: Every day | ORAL | Status: DC

## 2014-06-26 NOTE — Discharge Summary (Signed)
ADDENDUM - Physician Discharge Summary   Patient ID: [name] MRN: [id number] DOB/AGE: Jan 08, 1936 78 y.o.  Admit date: 06/25/2014 Discharge date: New Date of Discharge - 06/29/2014  Primary Diagnosis:  Osteoarthritis Right knee(s)  Admission Diagnoses:  Past Medical History  Diagnosis Date  . Hypertension   . Hypothyroidism   . Hyperlipidemia   . BPH (benign prostatic hypertrophy)   . History of colonic polyps   . Skin cancer (melanoma)     PMH of   . Coronary artery disease     DES Circumflex or CVA 2006  /  clear, February, 2011, EF 70%, no ischemia or  . Venous insufficiency   . Carotid artery disease     Doppler, December 08, 2011, 00 20% R. ICA, 35-59% LICA, followup 1 year  . Tremor     Fine tremor right upper extremity  . Ejection fraction     EF 60%, echo, 2009, mildly calcified aortic leaflets  . Multiple thyroid nodules     Avascular echogenic areas noted in the right thyroid at the time of carotid Doppler.   . Arthritis   . History of nonmelanoma skin cancer    Discharge Diagnoses:   Principal Problem:   OA (osteoarthritis) of knee  Estimated body mass index is 29.2 kg/(m^2) as calculated from the following:   Height as of this encounter: $RemoveBeforeD'5\' 8"'[id number]$  (1.727 m).   Weight as of this encounter: 87.091 kg (192 lb).  Procedure:  Procedure(s) (LRB): RIGHT TOTAL KNEE ARTHROPLASTY (Right)   Consults: None  HPI: [name] is a 78 y.o. year old male with end stage OA of his right knee with progressively worsening pain and dysfunction. He has constant pain, with activity and at rest and significant functional deficits with difficulties even with ADLs. He has had extensive non-op management including analgesics, injections of cortisone and viscosupplements, and home exercise program, but remains in significant pain with significant dysfunction. Radiographs show bone on bone arthritis lateral and patellofemoral. He presents now for rightt Total Knee Arthroplasty.    Laboratory Data: Admission on 06/25/2014  Component Date Value Ref Range Status  . ABO/RH(D) 06/25/2014 O POS   Final  . Antibody Screen 06/25/2014 NEG   Final  . Sample Expiration 06/25/2014 06/28/2014   Final  . WBC 06/26/2014 8.6  4.0 - 10.5 K/uL Final  . RBC 06/26/2014 4.29  4.22 - 5.81 MIL/uL Final  . Hemoglobin 06/26/2014 13.2  13.0 - 17.0 g/dL Final  . HCT 06/26/2014 39.3  39.0 - 52.0 % Final  . MCV 06/26/2014 91.6  78.0 - 100.0 fL Final  . MCH 06/26/2014 30.8  26.0 - 34.0 pg Final  . MCHC 06/26/2014 33.6  30.0 - 36.0 g/dL Final  . RDW 06/26/2014 14.0  11.5 - 15.5 % Final  . Platelets 06/26/2014 217  150 - 400 K/uL Final  . Sodium 06/26/2014 138  137 - 147 mEq/L Final  . Potassium 06/26/2014 4.0  3.7 - 5.3 mEq/L Final  . Chloride 06/26/2014 101  96 - 112 mEq/L Final  . CO2 06/26/2014 25  19 - 32 mEq/L Final  . Glucose, Bld 06/26/2014 132* 70 - 99 mg/dL Final  . BUN 06/26/2014 14  6 - 23 mg/dL Final  . Creatinine, Ser 06/26/2014 0.92  0.50 - 1.35 mg/dL Final  . Calcium 06/26/2014 8.5  8.4 - 10.5 mg/dL Final  . GFR calc non Af Amer 06/26/2014 80* >90 mL/min Final  . GFR calc Af Amer 06/26/2014 >90  >  90 mL/min Final   Comment: (NOTE)                          The eGFR has been calculated using the CKD EPI equation.                          This calculation has not been validated in all clinical situations.                          eGFR's persistently <90 mL/min signify possible Chronic Kidney                          Disease.  . Anion gap 06/26/2014 12  5 - 15 Final  . WBC 06/27/2014 12.3* 4.0 - 10.5 K/uL Final  . RBC 06/27/2014 4.04* 4.22 - 5.81 MIL/uL Final  . Hemoglobin 06/27/2014 12.3* 13.0 - 17.0 g/dL Final  . HCT 06/27/2014 37.1* 39.0 - 52.0 % Final  . MCV 06/27/2014 91.8  78.0 - 100.0 fL Final  . MCH 06/27/2014 30.4  26.0 - 34.0 pg Final  . MCHC 06/27/2014 33.2  30.0 - 36.0 g/dL Final  . RDW 06/27/2014 13.8  11.5 - 15.5 % Final  . Platelets 06/27/2014 193  150 -  400 K/uL Final  . Sodium 06/27/2014 132* 137 - 147 mEq/L Final  . Potassium 06/27/2014 4.3  3.7 - 5.3 mEq/L Final  . Chloride 06/27/2014 98  96 - 112 mEq/L Final  . CO2 06/27/2014 23  19 - 32 mEq/L Final  . Glucose, Bld 06/27/2014 111* 70 - 99 mg/dL Final  . BUN 06/27/2014 15  6 - 23 mg/dL Final  . Creatinine, Ser 06/27/2014 1.02  0.50 - 1.35 mg/dL Final  . Calcium 06/27/2014 8.7  8.4 - 10.5 mg/dL Final  . GFR calc non Af Amer 06/27/2014 69* >90 mL/min Final  . GFR calc Af Amer 06/27/2014 80* >90 mL/min Final   Comment: (NOTE)                          The eGFR has been calculated using the CKD EPI equation.                          This calculation has not been validated in all clinical situations.                          eGFR's persistently <90 mL/min signify possible Chronic Kidney                          Disease.  . Anion gap 06/27/2014 11  5 - 15 Final  . WBC 06/28/2014 11.8* 4.0 - 10.5 K/uL Final  . RBC 06/28/2014 3.76* 4.22 - 5.81 MIL/uL Final  . Hemoglobin 06/28/2014 11.5* 13.0 - 17.0 g/dL Final  . HCT 06/28/2014 34.0* 39.0 - 52.0 % Final  . MCV 06/28/2014 90.4  78.0 - 100.0 fL Final  . MCH 06/28/2014 30.6  26.0 - 34.0 pg Final  . MCHC 06/28/2014 33.8  30.0 - 36.0 g/dL Final  . RDW 06/28/2014 13.7  11.5 - 15.5 % Final  . Platelets 06/28/2014 220  150 - 400 K/uL Final  Hospital Outpatient Visit on 06/18/2014  Component Date Value Ref Range Status  . MRSA, PCR 06/18/2014 NEGATIVE  NEGATIVE Final  . Staphylococcus aureus 06/18/2014 NEGATIVE  NEGATIVE Final   Comment:                                 The Xpert SA Assay (FDA                          approved for NASAL specimens                          in patients over 2 years of age),                          is one component of                          a comprehensive surveillance                          program.  Test performance has                          been validated by [employer] for  patients greater                          than or equal to 85 year old.                          It is not intended                          to diagnose infection nor to                          guide or monitor treatment.  Marland Kitchen aPTT 06/18/2014 26  24 - 37 seconds Final  . WBC 06/18/2014 7.8  4.0 - 10.5 K/uL Final  . RBC 06/18/2014 5.07  4.22 - 5.81 MIL/uL Final  . Hemoglobin 06/18/2014 15.6  13.0 - 17.0 g/dL Final  . HCT 41/53/1603 47.2  39.0 - 52.0 % Final  . MCV 06/18/2014 93.1  78.0 - 100.0 fL Final  . MCH 06/18/2014 30.8  26.0 - 34.0 pg Final  . MCHC 06/18/2014 33.1  30.0 - 36.0 g/dL Final  . RDW 80/30/5654 14.4  11.5 - 15.5 % Final  . Platelets 06/18/2014 255  150 - 400 K/uL Final  . Sodium 06/18/2014 141  137 - 147 mEq/L Final  . Potassium 06/18/2014 4.3  3.7 - 5.3 mEq/L Final  . Chloride 06/18/2014 103  96 - 112 mEq/L Final  . CO2 06/18/2014 24  19 - 32 mEq/L Final  . Glucose, Bld 06/18/2014 86  70 - 99 mg/dL Final  . BUN 99/67/9809 18  6 - 23 mg/dL Final  . Creatinine, Ser 06/18/2014 1.07  0.50 - 1.35 mg/dL Final  . Calcium 13/83/9197 9.7  8.4 - 10.5 mg/dL Final  . Total Protein 06/18/2014 7.4  6.0 -  8.3 g/dL Final  . Albumin 34/37/3578 4.2  3.5 - 5.2 g/dL Final  . AST 97/84/7841 25  0 - 37 U/L Final  . ALT 06/18/2014 17  0 - 53 U/L Final  . Alkaline Phosphatase 06/18/2014 74  39 - 117 U/L Final  . Total Bilirubin 06/18/2014 0.3  0.3 - 1.2 mg/dL Final  . GFR calc non Af Amer 06/18/2014 65* >90 mL/min Final  . GFR calc Af Amer 06/18/2014 76* >90 mL/min Final   Comment: (NOTE)                          The eGFR has been calculated using the CKD EPI equation.                          This calculation has not been validated in all clinical situations.                          eGFR's persistently <90 mL/min signify possible Chronic Kidney                          Disease.  . Anion gap 06/18/2014 14  5 - 15 Final  . Prothrombin Time 06/18/2014 13.4  11.6 - 15.2 seconds Final  .  INR 06/18/2014 1.02  0.00 - 1.49 Final  . Color, Urine 06/18/2014 STRAW* YELLOW Final  . APPearance 06/18/2014 CLEAR  CLEAR Final  . Specific Gravity, Urine 06/18/2014 1.005  1.005 - 1.030 Final  . pH 06/18/2014 6.5  5.0 - 8.0 Final  . Glucose, UA 06/18/2014 NEGATIVE  NEGATIVE mg/dL Final  . Hgb urine dipstick 06/18/2014 NEGATIVE  NEGATIVE Final  . Bilirubin Urine 06/18/2014 NEGATIVE  NEGATIVE Final  . Ketones, ur 06/18/2014 NEGATIVE  NEGATIVE mg/dL Final  . Protein, ur 28/20/8138 NEGATIVE  NEGATIVE mg/dL Final  . Urobilinogen, UA 06/18/2014 0.2  0.0 - 1.0 mg/dL Final  . Nitrite 87/19/5974 NEGATIVE  NEGATIVE Final  . Leukocytes, UA 06/18/2014 NEGATIVE  NEGATIVE Final   MICROSCOPIC NOT DONE ON URINES WITH NEGATIVE PROTEIN, BLOOD, LEUKOCYTES, NITRITE, OR GLUCOSE <1000 mg/dL.     X-Rays:Dg Chest 2 View  06/18/2014   CLINICAL DATA:  Preop for knee replacement.  Hypertension.  EXAM: CHEST  2 VIEW  COMPARISON:  09/17/2006.  FINDINGS: Cardiac silhouette is normal in size. Aorta is mildly tortuous. No mediastinal or hilar masses or evidence of adenopathy.  Lungs are mildly hyperexpanded but clear. No pleural effusion or pneumothorax.  Bony thorax is demineralized but grossly intact.  IMPRESSION: No active cardiopulmonary disease.   Electronically Signed   By: Amie Portland M.D.   On: 06/18/2014 10:22    EKG: Orders placed in visit on 12/26/13  . EKG 12-LEAD     Hospital Course: [name] is a 78 y.o. who was admitted to [facility]. They were brought to the operating room on 06/25/2014 and underwent Procedure(s): RIGHT TOTAL KNEE ARTHROPLASTY.  Patient tolerated the procedure well and was later transferred to the recovery room and then to the orthopaedic floor for postoperative care.  They were given PO and IV analgesics for pain control following their surgery.  They were given 24 hours of postoperative antibiotics of      Anti-infectives   Start     Dose/Rate Route Frequency  Ordered Stop   06/25/14 1630  ceFAZolin (ANCEF)  IVPB 2 g/50 mL premix     2 g 100 mL/hr over 30 Minutes Intravenous Every 6 hours 06/25/14 1346 06/25/14 2137   06/25/14 0729  ceFAZolin (ANCEF) IVPB 2 g/50 mL premix     2 g 100 mL/hr over 30 Minutes Intravenous On call to O.R. 06/25/14 0729 06/25/14 1040     and started on DVT prophylaxis in the form of Xarelto.   PT and OT were ordered for total joint protocol.  Discharge planning consulted to help with postop disposition and equipment needs.  Patient had a good night on the evening of surgery and was able to get up with therapy and walk the day of surgery about 60 feet.  He was also able to perform SLR's the day of surgery.  They started to get up OOB with therapy on day one again. Hemovac drain was pulled without difficulty.  Continued to work with therapy into day two.  Dressing was changed on day two and the incision was healing well.  He had some indigestion that night before and place onto some GI PRN medications.  He belly was a little distended and he was provided further meds to assist with bowels movements.  By day three, the patient was slowly progressing with therapy, and had some progression with his bowels.  Incision was healing well.  Patient was seen in rounds by Dr. Wynelle Link and was ready to go home.  ADDENDUM - 06/29/2014 The patient had difficulty with therapy on transfers and stairs on 06/28/2018 with therapy that afternoon.  It was felt that the patient had not met all his goals successfully and was not safe to go home at that time.  He was kept and Education officer, museum got involved to assist with placement of the patient. He was seen again by Dr. Wynelle Link on Friday 06/29/2014 and felt as long as a bed was located that he would be ready to go to a SNF rehab bed.    Discharge home with home health  Diet - Cardiac diet  Follow up - in 2 weeks on Tuesday October 20th. Activity - WBAT  Disposition - Home  Condition Upon Discharge - improving    D/C Meds - See DC Summary  DVT Prophylaxis - Xarelto for a total of 21 days and then resume the Aspirin 325 mg daily at home.   Discharge Instructions   Call MD / Call 911    Complete by:  As directed   If you experience chest pain or shortness of breath, CALL 911 and be transported to the hospital emergency room.  If you develope a fever above 101 F, pus (white drainage) or increased drainage or redness at the wound, or calf pain, call your surgeon's office.     Change dressing    Complete by:  As directed   Change dressing daily with sterile 4 x 4 inch gauze dressing and apply TED hose. Do not submerge the incision under water.     Constipation Prevention    Complete by:  As directed   Drink plenty of fluids.  Prune juice may be helpful.  You may use a stool softener, such as Colace (over the counter) 100 mg twice a day.  Use MiraLax (over the counter) for constipation as needed.     Diet - low sodium heart healthy    Complete by:  As directed      Discharge instructions    Complete by:  As directed   Pick up stool  softner and laxative for home. Do not submerge incision under water. May shower. Continue to use ice for pain and swelling from surgery.  Take Xarelto for two and a half more weeks, then discontinue Xarelto. Once the patient has completed the Xarelto, they may resume the 325 mg Aspirin.  When discharged from the skilled rehab facility, please have the facility set up the patient's [place] prior to being released.  Also provide the patient with their medications at time of release from the facility to include their pain medication, the muscle relaxants, and their blood thinner medication.  If the patient is still at the rehab facility at time of follow up appointment, please also assist the patient in arranging follow up appointment in our office and any transportation needs.       Do not put a pillow under the knee. Place it under the heel.    Complete  by:  As directed      Do not sit on low chairs, stoools or toilet seats, as it may be difficult to get up from low surfaces    Complete by:  As directed      Driving restrictions    Complete by:  As directed   No driving until released by the physician.     Increase activity slowly as tolerated    Complete by:  As directed      Lifting restrictions    Complete by:  As directed   No lifting until released by the physician.     Patient may shower    Complete by:  As directed   You may shower without a dressing once there is no drainage.  Do not wash over the wound.  If drainage remains, do not shower until drainage stops.     TED hose    Complete by:  As directed   Use stockings (TED hose) for 3 weeks on both leg(s).  You may remove them at night for sleeping.     Weight bearing as tolerated    Complete by:  As directed             Medication List    STOP taking these medications       aspirin 325 MG tablet     sildenafil 100 MG tablet  Commonly known as:  VIAGRA     vitamin C 500 MG tablet  Commonly known as:  ASCORBIC ACID     ZOSTAVAX 55732 UNT/0.65ML injection  Generic drug:  zoster vaccine live (PF)      TAKE these medications       acetaminophen 325 MG tablet  Commonly known as:  TYLENOL  Take 2 tablets (650 mg total) by mouth every 6 (six) hours as needed for mild pain (or Fever >/= 101).     alum & mag hydroxide-simeth 200-200-20 MG/5ML suspension  Commonly known as:  MAALOX/MYLANTA  Take 30 mLs by mouth every 4 (four) hours as needed for indigestion or heartburn (if unrelieved with calcium carbonate (tums)).     atorvastatin 40 MG tablet  Commonly known as:  LIPITOR  Take 20 mg by mouth at bedtime.     benazepril 20 MG tablet  Commonly known as:  LOTENSIN  Take 30 mg by mouth every morning.     bisacodyl 10 MG suppository  Commonly known as:  DULCOLAX  Place 1 suppository (10 mg total) rectally daily as needed for moderate constipation.     calcium  carbonate 500  MG chewable tablet  Commonly known as:  TUMS - dosed in mg elemental calcium  Chew 1 tablet (200 mg of elemental calcium total) by mouth 3 (three) times daily with meals.     Chlorpheniramine-DM 1-7.5 MG/5ML Syrp  Take 5 mLs by mouth once as needed (cough).     diltiazem 180 MG 24 hr capsule  Commonly known as:  DILACOR XR  Take 180 mg by mouth every morning.     DSS 100 MG Caps  Take 100 mg by mouth 2 (two) times daily.     levothyroxine 150 MCG tablet  Commonly known as:  SYNTHROID, LEVOTHROID  Take 1 tablet (150 mcg total) by mouth daily before breakfast.     methocarbamol 500 MG tablet  Commonly known as:  ROBAXIN  Take 1 tablet (500 mg total) by mouth every 6 (six) hours as needed for muscle spasms.     metoprolol tartrate 25 MG tablet  Commonly known as:  LOPRESSOR  Take 12.5 mg by mouth 2 (two) times daily.     nitroGLYCERIN 0.4 MG SL tablet  Commonly known as:  NITROSTAT  Place 1 tablet (0.4 mg total) under the tongue every 5 (five) minutes as needed.     ondansetron 4 MG tablet  Commonly known as:  ZOFRAN  Take 1 tablet (4 mg total) by mouth every 6 (six) hours as needed for nausea.     oxyCODONE 5 MG immediate release tablet  Commonly known as:  Oxy IR/ROXICODONE  Take 1-2 tablets (5-10 mg total) by mouth every 3 (three) hours as needed for moderate pain, severe pain or breakthrough pain.     polyethylene glycol packet  Commonly known as:  MIRALAX / GLYCOLAX  Take 17 g by mouth daily as needed for mild constipation.     rivaroxaban 10 MG Tabs tablet  Commonly known as:  XARELTO  - Take 1 tablet (10 mg total) by mouth daily with breakfast. Take Xarelto for two and a half more weeks, then discontinue Xarelto.  - Once the patient has completed the Xarelto, they may resume the 325 mg Aspirin.     spironolactone 25 MG tablet  Commonly known as:  ALDACTONE  Take 25 mg by mouth every morning.     traMADol 50 MG tablet  Commonly known as:  ULTRAM    Take 1-2 tablets (50-100 mg total) by mouth every 6 (six) hours as needed (mild pain).       Follow-up Information   Follow up with [facility] (home health physical therapy)    Contact information:   [address] [place]  [address] [phone]       Follow up with Gearlean Alf, MD. Schedule an appointment as soon as possible for a visit on 07/10/2014. (Call office at [phone] to set up follow up appointment with Dr. Wynelle Link on Tuesday October 20th)    Specialty:  Orthopedic Surgery   Contact information:   [address] [place] [address] [phone]       Signed: [name], PA-C Orthopaedic Surgery 06/29/2014, 9:22 AM

## 2014-06-26 NOTE — Progress Notes (Signed)
   Subjective: 1 Day Post-Op Procedure(s) (LRB): RIGHT TOTAL KNEE ARTHROPLASTY (Right) Patient reports pain as mild.   Patient seen in rounds with Dr. Wynelle Link. Walked 60 feet with therapy yesterday. Already doing SLR's Patient is well, but has had some minor complaints of pain in the knee, requiring pain medications We will start therapy today.  Plan is to go Home after hospital stay.  Objective: Vital signs in last 24 hours: Temp:  [97.3 F (36.3 C)-98.8 F (37.1 C)] 98.8 F (37.1 C) (10/06 0519) Pulse Rate:  [41-60] 58 (10/06 0519) Resp:  [13-16] 16 (10/06 0519) BP: (92-146)/(41-65) 116/58 mmHg (10/06 0519) SpO2:  [96 %-100 %] 100 % (10/06 0519) Weight:  [87.091 kg (192 lb)] 87.091 kg (192 lb) (10/05 1340)  Intake/Output from previous day:  Intake/Output Summary (Last 24 hours) at 06/26/14 0812 Last data filed at 06/26/14 0630  Gross per 24 hour  Intake 5086.67 ml  Output   2875 ml  Net 2211.67 ml    Intake/Output this shift:    Labs:  Recent Labs  06/26/14 0441  HGB 13.2    Recent Labs  06/26/14 0441  WBC 8.6  RBC 4.29  HCT 39.3  PLT 217    Recent Labs  06/26/14 0441  NA 138  K 4.0  CL 101  CO2 25  BUN 14  CREATININE 0.92  GLUCOSE 132*  CALCIUM 8.5   No results found for this basename: LABPT, INR,  in the last 72 hours  EXAM General - Patient is Alert, Appropriate and Oriented Extremity - Neurovascular intact Sensation intact distally Dorsiflexion/Plantar flexion intact Dressing - dressing C/D/I Motor Function - intact, moving foot and toes well on exam.  Hemovac pulled without difficulty.  Past Medical History  Diagnosis Date  . Hypertension   . Hypothyroidism   . Hyperlipidemia   . BPH (benign prostatic hypertrophy)   . History of colonic polyps   . Skin cancer (melanoma)     PMH of   . Coronary artery disease     DES Circumflex or CVA 2006  /  clear, February, 2011, EF 70%, no ischemia or  . Venous insufficiency   . Carotid  artery disease     Doppler, December 08, 2011, 00 16% R. ICA, 60-63% LICA, followup 1 year  . Tremor     Fine tremor right upper extremity  . Ejection fraction     EF 60%, echo, 2009, mildly calcified aortic leaflets  . Multiple thyroid nodules     Avascular echogenic areas noted in the right thyroid at the time of carotid Doppler.   . Arthritis   . History of nonmelanoma skin cancer     Assessment/Plan: 1 Day Post-Op Procedure(s) (LRB): RIGHT TOTAL KNEE ARTHROPLASTY (Right) Principal Problem:   OA (osteoarthritis) of knee  Estimated body mass index is 29.2 kg/(m^2) as calculated from the following:   Height as of this encounter: 5\' 8"  (1.727 m).   Weight as of this encounter: 87.091 kg (192 lb). Advance diet Up with therapy Plan for discharge tomorrow  DVT Prophylaxis - Xarelto Weight-Bearing as tolerated to right leg D/C O2 and Pulse OX and try on Room Air  [name], PA-C Orthopaedic Surgery 06/26/2014, 8:12 AM

## 2014-06-26 NOTE — Care Management Note (Addendum)
    Page 1 of 2   06/28/2014     11:58:33 AM CARE MANAGEMENT NOTE 06/28/2014  Patient:  [name], [name]   Account Number:  [id number]  Date Initiated:  06/26/2014  Documentation initiated by:  [facility]  Subjective/Objective Assessment:   adm: RIGHT TOTAL KNEE ARTHROPLASTY (Right)     Action/Plan:   discharge planning   Anticipated DC Date:  06/26/2014   Anticipated DC Plan:  [place]  CM consult      [facility] Hospital Choice  HOME HEALTH   Choice offered to / List presented to:  C-1 Patient   DME arranged  NA      DME agency  NA     [place] arranged  HH-2 PT      [place]   Status of service:  Completed, signed off Medicare Important Message given?  YES (If response is "NO", the following Medicare IM given date fields will be blank) Date Medicare IM given:  06/28/2014 Medicare IM given by:   Date Additional Medicare IM given:   Additional Medicare IM given by:    Discharge Disposition:  [place]  Per UR Regulation:    If discussed at Long Length of Stay Meetings, dates discussed:    Comments:  06/26/14 08:00 CM met with pt in room to offer choice of home health agency.  Pt chooses [facility] to render HHPT.  Pt states he does not need any DME as he has all he needs from previous surgery.  Address and contact information verified with pt.  Referral called to [facility].  No other CM needs were communicated.  Mariane Masters, BSN, CM [phone].

## 2014-06-26 NOTE — Progress Notes (Signed)
Physical Therapy Treatment Patient Details Name: [name] MRN: [id number] DOB: 1936/03/14 Today's Date: 06/26/2014    History of Present Illness s/p R TKA    PT Comments    POD # 1 am session.  Pt OOB in recliner by OT.  Applied KI and instructed on use for amb.  Amb in hallway then returned to recliner to perform TKR TE's followed by ICE.    Follow Up Recommendations  Home health PT     Equipment Recommendations       Recommendations for Other Services       Precautions / Restrictions Precautions Precautions: Knee Precaution Comments: used KI today due to increased c/o pain Required Braces or Orthoses:  (did SLR) Restrictions Weight Bearing Restrictions: No Other Position/Activity Restrictions: WBAT    Mobility  Bed Mobility   Bed Mobility: Supine to Sit     Supine to sit: Min guard     General bed mobility comments: Pt OOB in recliner  Transfers Overall transfer level: Needs assistance Equipment used: Rolling walker (2 wheeled) Transfers: Sit to/from Stand Sit to Stand: Min assist;Min guard         General transfer comment: verbal cues hand placement and LE positioning  Ambulation/Gait       Gait Pattern/deviations: Step-to pattern;Trunk flexed;Decreased stance time - right Gait velocity: decreased   General Gait Details: cues for sequence safety using RW correctly    Stairs            Wheelchair Mobility    Modified Rankin (Stroke Patients Only)       Balance                                    Cognition Arousal/Alertness: Awake/alert Behavior During Therapy: WFL for tasks assessed/performed Overall Cognitive Status: Within Functional Limits for tasks assessed                      Exercises   Total Knee Replacement TE's 10 reps B LE ankle pumps 10 reps towel squeezes 10 reps knee presses 10 reps heel slides  10 reps SAQ's 10 reps SLR's 10 reps ABD Followed by ICE     General Comments         Pertinent Vitals/Pain Pain Assessment: 0-10 Pain Score: 3  Pain Location: R knee Pain Descriptors / Indicators: Aching Pain Intervention(s): Repositioned;Ice applied    Home Living Family/patient expects to be discharged to:: Private residence Living Arrangements: Spouse/significant other Available Help at Discharge: Family Type of Home: House Home Access: Stairs to enter Entrance Stairs-Rails: Right Home Layout: One level Home Equipment: Environmental consultant - 2 wheels;Cane - single point;Bedside commode;Adaptive equipment      Prior Function Level of Independence: Independent          PT Goals (current goals can now be found in the care plan section) Acute Rehab PT Goals Patient Stated Goal: return to independence Progress towards PT goals: Progressing toward goals    Frequency  7X/week    PT Plan      Co-evaluation             End of Session Equipment Utilized During Treatment: Gait belt;Right knee immobilizer Activity Tolerance: Patient tolerated treatment well Patient left: in chair;with family/visitor present;with call bell/phone within reach     Time: 1150-1229 PT Time Calculation (min): 39 min  Charges:  $Gait Training: 8-22 mins $Therapeutic Exercise: 8-22  mins $Therapeutic Activity: 8-22 mins                    G Codes:      Rica Koyanagi  PTA [facility]  Acute  Rehab Pager      [phone]

## 2014-06-26 NOTE — Progress Notes (Signed)
Physical Therapy Treatment Patient Details Name: [name] MRN: [id number] DOB: 28-Aug-1936 Today's Date: 06/26/2014    History of Present Illness s/p R TKA    PT Comments    POD # 1 pm session.  Applied KI and assisted with amb in hallway a greater distance.  Assisted to BR to void required increased time.  Assisted pt back to bed.  Follow Up Recommendations  Home health PT     Equipment Recommendations  None recommended by PT    Recommendations for Other Services       Precautions / Restrictions Precautions Precautions: Knee Precaution Comments: used KI today due to increased c/o pain Restrictions Weight Bearing Restrictions: No Other Position/Activity Restrictions: WBAT    Mobility  Bed Mobility   Bed Mobility: Sit to Supine     Supine to sit: Min assist     General bed mobility comments: min asssit for R LE  Transfers Overall transfer level: Needs assistance Equipment used: Rolling walker (2 wheeled) Transfers: Sit to/from Stand Sit to Stand: Min assist;Min guard         General transfer comment: verbal cues hand placement and LE positioning  Ambulation/Gait Ambulation/Gait assistance: Min guard;Min assist Ambulation Distance (Feet): 75 Feet Assistive device: Rolling walker (2 wheeled) Gait Pattern/deviations: Step-to pattern;Decreased step length - left;Trunk flexed Gait velocity: decreased   General Gait Details: cues for sequence safety using RW correctly    Stairs            Wheelchair Mobility    Modified Rankin (Stroke Patients Only)       Balance                                    Cognition                            Exercises      General Comments        Pertinent Vitals/Pain      Home Living                      Prior Function            PT Goals (current goals can now be found in the care plan section) Progress towards PT goals: Progressing toward goals     Frequency  7X/week    PT Plan      Co-evaluation             End of Session Equipment Utilized During Treatment: Gait belt;Right knee immobilizer Activity Tolerance: Patient tolerated treatment well Patient left: in bed;with call bell/phone within reach;with family/visitor present     Time: 4332-9518 PT Time Calculation (min): 30 min  Charges:  $Gait Training: 8-22 mins $Therapeutic Activity: 8-22 mins                    G Codes:      Rica Koyanagi  PTA [facility]  Acute  Rehab Pager      [phone]

## 2014-06-26 NOTE — Evaluation (Signed)
Occupational Therapy Evaluation Patient Details Name: [name] MRN: [id number] DOB: Nov 24, 1935 Today's Date: 06/26/2014    History of Present Illness s/p R TKA   Clinical Impression   Pt doing well and overall at min assist for functional transfers and mod assist LB self care without AE. Wife present for session. Will follow for acute OT to progress ADL independence.     Follow Up Recommendations  No OT follow up;Supervision/Assistance - 24 hour    Equipment Recommendations  None recommended by OT    Recommendations for Other Services       Precautions / Restrictions Precautions Precautions: Knee Required Braces or Orthoses:  (did SLR) Restrictions Other Position/Activity Restrictions: WBAT      Mobility Bed Mobility   Bed Mobility: Supine to Sit     Supine to sit: Min guard     General bed mobility comments: verbal cues for technique and safety  Transfers Overall transfer level: Needs assistance Equipment used: Rolling walker (2 wheeled) Transfers: Sit to/from Stand Sit to Stand: Min assist         General transfer comment: verbal cues hand placement and LE positioning    Balance                                            ADL Overall ADL's : Needs assistance/impaired Eating/Feeding: Independent;Sitting   Grooming: Wash/dry hands;Set up;Sitting   Upper Body Bathing: Set up;Sitting   Lower Body Bathing: Moderate assistance;Sit to/from stand   Upper Body Dressing : Set up;Sitting   Lower Body Dressing: Moderate assistance;Sit to/from stand   Toilet Transfer: Minimal assistance;Ambulation;BSC;RW   Toileting- Clothing Manipulation and Hygiene: Minimal assistance;Sit to/from stand         General ADL Comments: Pt attempted to use urinal but unable yet. Practiced 3in1 transfer and needed verbal cues for hand placement and LE positioning. He has a walk in shower and discussed options for how to position 3in1 in shower to use  as a shower seat. He has AE and is familiar with how to use for LB dressing.      Vision                     Perception     Praxis      Pertinent Vitals/Pain Pain Assessment: 0-10 Pain Score: 3  Pain Location: R knee Pain Descriptors / Indicators: Aching Pain Intervention(s): Repositioned;Ice applied     Hand Dominance     Extremity/Trunk Assessment Upper Extremity Assessment Upper Extremity Assessment: Overall WFL for tasks assessed           Communication Communication Communication: No difficulties   Cognition Arousal/Alertness: Awake/alert Behavior During Therapy: WFL for tasks assessed/performed Overall Cognitive Status: Within Functional Limits for tasks assessed                     General Comments       Exercises       Shoulder Instructions      Home Living Family/patient expects to be discharged to:: Private residence Living Arrangements: Spouse/significant other Available Help at Discharge: Family Type of Home: House Home Access: Stairs to enter Technical brewer of Steps: 3.5 Entrance Stairs-Rails: Right Home Layout: One level     Bathroom Shower/Tub: Occupational psychologist: Standard     Home Equipment: Environmental consultant - 2 wheels;Cane -  single point;Bedside commode;Adaptive equipment Adaptive Equipment: Reacher;Sock aid;Long-handled shoe horn        Prior Functioning/Environment Level of Independence: Independent             OT Diagnosis: Generalized weakness   OT Problem List: Decreased strength;Decreased knowledge of use of DME or AE   OT Treatment/Interventions: Self-care/ADL training;Patient/family education;Therapeutic activities;DME and/or AE instruction    OT Goals(Current goals can be found in the care plan section) Acute Rehab OT Goals Patient Stated Goal: return to independence OT Goal Formulation: With patient Time For Goal Achievement: 07/03/14 Potential to Achieve Goals: Good  OT  Frequency: Min 2X/week   Barriers to D/C:            Co-evaluation              End of Session Equipment Utilized During Treatment: Gait belt;Rolling walker  Activity Tolerance: Patient tolerated treatment well Patient left: in chair;with call bell/phone within reach   Time: 0950-1020 OT Time Calculation (min): 30 min Charges:  OT General Charges $OT Visit: 1 Procedure OT Evaluation $Initial OT Evaluation Tier I: 1 Procedure OT Treatments $Self Care/Home Management : 8-22 mins $Therapeutic Activity: 8-22 mins G-Codes:    Jules Schick [phone] 06/26/2014, 11:08 AM

## 2014-06-26 NOTE — Discharge Instructions (Addendum)
° °Dr. Frank Aluisio °Total Joint Specialist °[facility] °[address] °[place], [place] [address] °[phone] ° °TOTAL KNEE REPLACEMENT POSTOPERATIVE DIRECTIONS ° ° ° °Knee Rehabilitation, Guidelines Following Surgery  °Results after knee surgery are often greatly improved when you follow the exercise, range of motion and muscle strengthening exercises prescribed by your doctor. Safety measures are also important to protect the knee from further injury. Any time any of these exercises cause you to have increased pain or swelling in your knee joint, decrease the amount until you are comfortable again and slowly increase them. If you have problems or questions, call your caregiver or physical therapist for advice.  ° °HOME CARE INSTRUCTIONS  °Remove items at home which could result in a fall. This includes throw rugs or furniture in walking pathways.  °Continue medications as instructed at time of discharge. °You may have some home medications which will be placed on hold until you complete the course of blood thinner medication.  °You may start showering once you are discharged home but do not submerge the incision under water. Just pat the incision dry and apply a dry gauze dressing on daily. °Walk with walker as instructed.  °You may resume a sexual relationship in one month or when given the OK by  your doctor.  °· Use walker as long as suggested by your caregivers. °· Avoid periods of inactivity such as sitting longer than an hour when not asleep. This helps prevent blood clots.  °You may put full weight on your legs and walk as much as is comfortable.  °You may return to work once you are cleared by your doctor.  °Do not drive a car for 6 weeks or until released by you surgeon.  °· Do not drive while taking narcotics.  °Wear the elastic stockings for three weeks following surgery during the day but you may remove then at night. °Make sure you keep all of your appointments after your  operation with all of your doctors and caregivers. You should call the office at the above phone number and make an appointment for approximately two weeks after the date of your surgery. °Change the dressing daily and reapply a dry dressing each time. °Please pick up a stool softener and laxative for home use as long as you are requiring pain medications. °· Continue to use ice on the knee for pain and swelling from surgery. You may notice swelling that will progress down to the foot and ankle.  This is normal after surgery.  Elevate the leg when you are not up walking on it.   °It is important for you to complete the blood thinner medication as prescribed by your doctor. °· Continue to use the breathing machine which will help keep your temperature down.  It is common for your temperature to cycle up and down following surgery, especially at night when you are not up moving around and exerting yourself.  The breathing machine keeps your lungs expanded and your temperature down. ° °RANGE OF MOTION AND STRENGTHENING EXERCISES  °Rehabilitation of the knee is important following a knee injury or an operation. After just a few days of immobilization, the muscles of the thigh which control the knee become weakened and shrink (atrophy). Knee exercises are designed to build up the tone and strength of the thigh muscles and to improve knee motion. Often times heat used for twenty to thirty minutes before working out will loosen up your tissues and help with improving the   range of motion but do not use heat for the first two weeks following surgery. These exercises can be done on a training (exercise) mat, on the floor, on a table or on a bed. Use what ever works the best and is most comfortable for you Knee exercises include:  Leg Lifts - While your knee is still immobilized in a splint or cast, you can do straight leg raises. Lift the leg to 60 degrees, hold for 3 sec, and slowly lower the leg. Repeat 10-20 times 2-3  times daily. Perform this exercise against resistance later as your knee gets better.  Quad and Hamstring Sets - Tighten up the muscle on the front of the thigh (Quad) and hold for 5-10 sec. Repeat this 10-20 times hourly. Hamstring sets are done by pushing the foot backward against an object and holding for 5-10 sec. Repeat as with quad sets.  A rehabilitation program following serious knee injuries can speed recovery and prevent re-injury in the future due to weakened muscles. Contact your doctor or a physical therapist for more information on knee rehabilitation.   SKILLED REHAB INSTRUCTIONS: If the patient is transferred to a skilled rehab facility following release from the hospital, a list of the current medications will be sent to the facility for the patient to continue.  When discharged from the skilled rehab facility, please have the facility set up the patient's [place] prior to being released. Also, the skilled facility will be responsible for providing the patient with their medications at time of release from the facility to include their pain medication, the muscle relaxants, and their blood thinner medication. If the patient is still at the rehab facility at time of the two week follow up appointment, the skilled rehab facility will also need to assist the patient in arranging follow up appointment in our office and any transportation needs.  MAKE SURE YOU:  Understand these instructions.  Will watch your condition.  Will get help right away if you are not doing well or get worse.    Pick up stool softner and laxative for home. Do not submerge incision under water. May shower. Continue to use ice for pain and swelling from surgery.  Take Xarelto for two and a half more weeks, then discontinue Xarelto. Once the patient has completed the Xarelto, they may resume the 325 mg Aspirin.  When discharged from the skilled rehab facility, please have the facility set  up the patient's [place] prior to being released.  Also provide the patient with their medications at time of release from the facility to include their pain medication, the muscle relaxants, and their blood thinner medication.  If the patient is still at the rehab facility at time of follow up appointment, please also assist the patient in arranging follow up appointment in our office and any transportation needs.    Information on my medicine - XARELTO (Rivaroxaban)  This medication education was reviewed with me or my healthcare representative as part of my discharge preparation.  The pharmacist that spoke with me during my hospital stay was:  Hershal Coria, [facility]  Why was Xarelto prescribed for you? Xarelto was prescribed for you to reduce the risk of blood clots forming after orthopedic surgery. The medical term for these abnormal blood clots is venous thromboembolism (VTE).  What do you need to know about xarelto ? Take your Xarelto ONCE DAILY at the same time every day. You may take it either with or  either with or without food. ° °If you have difficulty swallowing the tablet whole, you may crush it and mix in applesauce just prior to taking your dose. ° °Take Xarelto® exactly as prescribed by your doctor and DO NOT stop taking Xarelto® without talking to the doctor who prescribed the medication.  Stopping without other VTE prevention medication to take the place of Xarelto® may increase your risk of developing a clot. ° °After discharge, you should have regular check-up appointments with your healthcare provider that is prescribing your Xarelto®.   ° °What do you do if you miss a dose? °If you miss a dose, take it as soon as you remember on the same day then continue your regularly scheduled once daily regimen the next day. Do not take two doses of Xarelto® on the same day.  ° °Important Safety Information °A possible side effect of Xarelto® is bleeding. You should call your  healthcare provider right away if you experience any of the following: °  Bleeding from an injury or your nose that does not stop. °  Unusual colored urine (red or dark brown) or unusual colored stools (red or black). °  Unusual bruising for unknown reasons. °  A serious fall or if you hit your head (even if there is no bleeding). ° °Some medicines may interact with Xarelto® and might increase your risk of bleeding while on Xarelto®. To help avoid this, consult your healthcare provider or pharmacist prior to using any new prescription or non-prescription medications, including herbals, vitamins, non-steroidal anti-inflammatory drugs (NSAIDs) and supplements. ° °This website has more information on Xarelto®: www.xarelto.com. ° ° °

## 2014-06-27 LAB — CBC
HCT: 37.1 % — ABNORMAL LOW (ref 39.0–52.0)
Hemoglobin: 12.3 g/dL — ABNORMAL LOW (ref 13.0–17.0)
MCH: 30.4 pg (ref 26.0–34.0)
MCHC: 33.2 g/dL (ref 30.0–36.0)
MCV: 91.8 fL (ref 78.0–100.0)
PLATELETS: 193 10*3/uL (ref 150–400)
RBC: 4.04 MIL/uL — AB (ref 4.22–5.81)
RDW: 13.8 % (ref 11.5–15.5)
WBC: 12.3 10*3/uL — ABNORMAL HIGH (ref 4.0–10.5)

## 2014-06-27 LAB — BASIC METABOLIC PANEL
ANION GAP: 11 (ref 5–15)
BUN: 15 mg/dL (ref 6–23)
CALCIUM: 8.7 mg/dL (ref 8.4–10.5)
CHLORIDE: 98 meq/L (ref 96–112)
CO2: 23 mEq/L (ref 19–32)
Creatinine, Ser: 1.02 mg/dL (ref 0.50–1.35)
GFR calc Af Amer: 80 mL/min — ABNORMAL LOW (ref >90)
GFR calc non Af Amer: 69 mL/min — ABNORMAL LOW (ref >90)
Glucose, Bld: 111 mg/dL — ABNORMAL HIGH (ref 70–99)
POTASSIUM: 4.3 meq/L (ref 3.7–5.3)
Sodium: 132 mEq/L — ABNORMAL LOW (ref 137–147)

## 2014-06-27 MED ORDER — ALUM & MAG HYDROXIDE-SIMETH 200-200-20 MG/5ML PO SUSP: 30.0000 mL | ORAL | Status: DC | PRN

## 2014-06-27 MED ORDER — CALCIUM CARBONATE ANTACID 500 MG PO CHEW: 1.0000 | CHEWABLE_TABLET | Freq: Four times a day (QID) | ORAL | Status: DC | PRN

## 2014-06-27 NOTE — Progress Notes (Signed)
Occupational Therapy Treatment Patient Details Name: [name] MRN: [id number] DOB: Sep 25, 1935 Today's Date: 06/27/2014    History of present illness s/p R TKA   OT comments  Pt reports feeling "stiff" this session and required increased time for transfer into bathroom and back to chair with walker. Nursing made aware that OT doesn't feel pt is ready for d/c today. Pt also appears slightly slower with mentation /following commands. Will continue to follow.   Follow Up Recommendations  Home health OT;Supervision/Assistance - 24 hour    Equipment Recommendations  None recommended by OT    Recommendations for Other Services      Precautions / Restrictions Precautions Precautions: Knee Required Braces or Orthoses: Knee Immobilizer - Right Knee Immobilizer - Right: Discontinue once straight leg raise with < 10 degree lag Restrictions Weight Bearing Restrictions: No Other Position/Activity Restrictions: WBAT       Mobility Bed Mobility                  Transfers Overall transfer level: Needs assistance Equipment used: Rolling walker (2 wheeled) Transfers: Sit to/from Stand Sit to Stand: Min assist         General transfer comment: increased time with transitions today. verbal cues with hand placement and LE management.     Balance                                   ADL                           Toilet Transfer: Minimal assistance;Ambulation;BSC;RW   Toileting- Clothing Manipulation and Hygiene: Moderate assistance;Sit to/from stand         General ADL Comments: Pt required increased time to transfer into the bathroom today and back to chair. he reports feeling "stiff" today and had more difficulty wtih transitions from sit to stand and stand to sit. Used KI as pt not SLR with OT today.  Do not feel pt is ready to d/c home today and nursing in room and made aware. Pt needing cues still for safety with hand placement and LE  management also. Did not practice shower transfer as pt not ready to step over ledge. Pt declined needing to practice with AE as he has used it in the past. Pt also appears somewhat slower with his mentation and answering questions.       Vision                     Perception     Praxis      Cognition   Behavior During Therapy: WFL for tasks assessed/performed Overall Cognitive Status: Impaired/Different from baseline (appears alittle slower with thinking, processing)                       Extremity/Trunk Assessment               Exercises     Shoulder Instructions       General Comments      Pertinent Vitals/ Pain       Pain Assessment: 0-10 Pain Score: 5  Pain Location: R knee Pain Descriptors / Indicators: Aching Pain Intervention(s): Patient requesting pain meds-RN notified;Repositioned;Ice applied  Home Living  Prior Functioning/Environment              Frequency Min 2X/week     Progress Toward Goals  OT Goals(current goals can now be found in the care plan section)  Progress towards OT goals: Not progressing toward goals - comment (having more difficulty mobilizing this session)     Plan Discharge plan needs to be updated    Co-evaluation                 End of Session Equipment Utilized During Treatment: Rolling walker;Right knee immobilizer;Gait belt   Activity Tolerance Patient limited by pain   Patient Left in chair;with call bell/phone within reach   Nurse Communication          Time: 3500-9381 OT Time Calculation (min): 41 min  Charges: OT General Charges $OT Visit: 1 Procedure OT Treatments $Self Care/Home Management : 8-22 mins $Therapeutic Activity: 23-37 mins  Jules Schick [phone] 06/27/2014, 11:42 AM

## 2014-06-27 NOTE — Progress Notes (Signed)
Physical Therapy Treatment Patient Details Name: [name] MRN: [id number] DOB: 02-16-1936 Today's Date: 06/27/2014    History of Present Illness s/p R TKA    PT Comments    POD # 2 am session.  Pt VERY slow moving today with notable increased distended stomach and c/o ABD tightness.  Assisted pt OOB to amb in hallway with increased, increased, increased time.  VERY slow moving today.  Performed TKR TE's followed by ICE.   Follow Up Recommendations  Home health PT     Equipment Recommendations  None recommended by PT    Recommendations for Other Services       Precautions / Restrictions Precautions Precautions: Knee Precaution Comments: used KI today due to increased c/o pain Required Braces or Orthoses: Knee Immobilizer - Right Knee Immobilizer - Right: Discontinue once straight leg raise with < 10 degree lag Restrictions Weight Bearing Restrictions: No Other Position/Activity Restrictions: WBAT    Mobility  Bed Mobility Overal bed mobility: Needs Assistance Bed Mobility: Supine to Sit     Supine to sit: Min assist     General bed mobility comments: min asssit for R LE  Transfers Overall transfer level: Needs assistance Equipment used: Rolling walker (2 wheeled) Transfers: Sit to/from Stand Sit to Stand: Min assist         General transfer comment: increased time with transitions today. verbal cues with hand placement and LE management.   Ambulation/Gait Ambulation/Gait assistance: Min assist Ambulation Distance (Feet): 82 Feet Assistive device: Rolling walker (2 wheeled) Gait Pattern/deviations: Step-to pattern Gait velocity: decreased more so than yesterday   General Gait Details: cues for sequence safety using RW correctly    Stairs            Wheelchair Mobility    Modified Rankin (Stroke Patients Only)       Balance                                    Cognition Arousal/Alertness: Awake/alert Behavior During  Therapy: WFL for tasks assessed/performed Overall Cognitive Status: Impaired/Different from baseline (appears alittle slower with thinking, processing)                      Exercises   Total Knee Replacement TE's 10 reps B LE ankle pumps 10 reps towel squeezes 10 reps knee presses 10 reps heel slides  10 reps SAQ's 10 reps SLR's 10 reps ABD Followed by ICE     General Comments        Pertinent Vitals/Pain      Home Living                      Prior Function            PT Goals (current goals can now be found in the care plan section) Progress towards PT goals: Progressing toward goals    Frequency  7X/week    PT Plan      Co-evaluation             End of Session Equipment Utilized During Treatment: Gait belt;Right knee immobilizer Activity Tolerance: Patient tolerated treatment well Patient left: in chair;with call bell/phone within reach;with family/visitor present     Time: 8016-5537 PT Time Calculation (min): 46 min  Charges:  $Gait Training: 8-22 mins $Therapeutic Exercise: 8-22 mins $Therapeutic Activity: 8-22 mins  G Codes:      Rica Koyanagi  PTA [facility]  Acute  Rehab Pager      [phone]

## 2014-06-27 NOTE — Progress Notes (Signed)
   Subjective: 2 Days Post-Op Procedure(s) (LRB): RIGHT TOTAL KNEE ARTHROPLASTY (Right) Patient reports pain as mild.   Patient seen in rounds with Dr. Wynelle Link.  He had some indigestion last night but none this morning. Patient is well, but has had some minor complaints of pain in the knee, requiring pain medications Plan is to go Home after hospital stay.  Objective: Vital signs in last 24 hours: Temp:  [98.7 F (37.1 C)-99.7 F (37.6 C)] 99.7 F (37.6 C) (10/07 1444) Pulse Rate:  [76-90] 87 (10/07 1444) Resp:  [16-20] 18 (10/07 1444) BP: (117-156)/(54-71) 117/54 mmHg (10/07 1444) SpO2:  [94 %-98 %] 94 % (10/07 1444)  Intake/Output from previous day:  Intake/Output Summary (Last 24 hours) at 06/27/14 1525 Last data filed at 06/27/14 1140  Gross per 24 hour  Intake   1140 ml  Output   1175 ml  Net    -35 ml    Intake/Output this shift: Total I/O In: 480 [P.O.:480] Out: 400 [Urine:400]  Labs:  Recent Labs  06/26/14 0441 06/27/14 0415  HGB 13.2 12.3*    Recent Labs  06/26/14 0441 06/27/14 0415  WBC 8.6 12.3*  RBC 4.29 4.04*  HCT 39.3 37.1*  PLT 217 193    Recent Labs  06/26/14 0441 06/27/14 0415  NA 138 132*  K 4.0 4.3  CL 101 98  CO2 25 23  BUN 14 15  CREATININE 0.92 1.02  GLUCOSE 132* 111*  CALCIUM 8.5 8.7   No results found for this basename: LABPT, INR,  in the last 72 hours  EXAM General - Patient is Alert, Appropriate and Oriented Extremity - Neurovascular intact Sensation intact distally Dorsiflexion/Plantar flexion intact Dressing/Incision - clean, dry, no drainage, healing Motor Function - intact, moving foot and toes well on exam.   Past Medical History  Diagnosis Date  . Hypertension   . Hypothyroidism   . Hyperlipidemia   . BPH (benign prostatic hypertrophy)   . History of colonic polyps   . Skin cancer (melanoma)     PMH of   . Coronary artery disease     DES Circumflex or CVA 2006  /  clear, February, 2011, EF 70%, no  ischemia or  . Venous insufficiency   . Carotid artery disease     Doppler, December 08, 2011, 00 97% R. ICA, 35-32% LICA, followup 1 year  . Tremor     Fine tremor right upper extremity  . Ejection fraction     EF 60%, echo, 2009, mildly calcified aortic leaflets  . Multiple thyroid nodules     Avascular echogenic areas noted in the right thyroid at the time of carotid Doppler.   . Arthritis   . History of nonmelanoma skin cancer     Assessment/Plan: 2 Days Post-Op Procedure(s) (LRB): RIGHT TOTAL KNEE ARTHROPLASTY (Right) Principal Problem:   OA (osteoarthritis) of knee  Estimated body mass index is 29.2 kg/(m^2) as calculated from the following:   Height as of this encounter: 5\' 8"  (1.727 m).   Weight as of this encounter: 87.091 kg (192 lb). Advance diet Up with therapy Plan for discharge tomorrow Discharge home with home health  DVT Prophylaxis - Xarelto Weight-Bearing as tolerated to right leg  [name], PA-C Orthopaedic Surgery 06/27/2014, 3:25 PM

## 2014-06-27 NOTE — Progress Notes (Signed)
Physical Therapy Treatment Patient Details Name: [name] MRN: [id number] DOB: 02-Feb-1936 Today's Date: 06/27/2014    History of Present Illness s/p R TKA    PT Comments    POD # 2 pm session.  Pt progressing slowly.  Still VERY slow moving requiring almost twice amount of time to complete same distance as yesterday.  MAX c/o ABD tightness and inability to have a BM.  After amb in hallway assisted to BR with no results.  Assisted back to bed with ICE.   Pt plans to D/c to home with spouse tomorrow.   Follow Up Recommendations  Home health PT     Equipment Recommendations  None recommended by PT    Recommendations for Other Services       Precautions / Restrictions Precautions Precautions: Knee Precaution Comments: used KI today due to increased c/o pain Required Braces or Orthoses: Knee Immobilizer - Right Knee Immobilizer - Right: Discontinue once straight leg raise with < 10 degree lag Restrictions Weight Bearing Restrictions: No Other Position/Activity Restrictions: WBAT    Mobility  Bed Mobility Overal bed mobility: Needs Assistance Bed Mobility: Supine to Sit     Supine to sit: Min assist     General bed mobility comments: min asssit for R LE  Transfers Overall transfer level: Needs assistance Equipment used: Rolling walker (2 wheeled) Transfers: Sit to/from Stand Sit to Stand: Min assist         General transfer comment: increased time with transitions today. verbal cues with hand placement and LE management.   Ambulation/Gait Ambulation/Gait assistance: Min assist Ambulation Distance (Feet): 95 Feet Assistive device: Rolling walker (2 wheeled) Gait Pattern/deviations: Step-to pattern Gait velocity: decreased more so than yesterday   General Gait Details: cues for sequence safety using RW correctly    Stairs            Wheelchair Mobility    Modified Rankin (Stroke Patients Only)       Balance                                     Cognition Arousal/Alertness: Awake/alert Behavior During Therapy: WFL for tasks assessed/performed Overall Cognitive Status: Impaired/Different from baseline (appears alittle slower with thinking, processing)                      Exercises      General Comments        Pertinent Vitals/Pain      Home Living                      Prior Function            PT Goals (current goals can now be found in the care plan section) Progress towards PT goals: Progressing toward goals    Frequency  7X/week    PT Plan      Co-evaluation             End of Session Equipment Utilized During Treatment: Gait belt;Right knee immobilizer Activity Tolerance: Patient tolerated treatment well Patient left: in chair;with call bell/phone within reach;with family/visitor present     Time: 1400-1430 PT Time Calculation (min): 30 min  Charges:  $Gait Training: 8-22 mins $Therapeutic Activity: 8-22 mins                    G Codes:  Rica Koyanagi  PTA [facility]  Acute  Rehab Pager      [phone]

## 2014-06-28 LAB — CBC
HCT: 34 % — ABNORMAL LOW (ref 39.0–52.0)
Hemoglobin: 11.5 g/dL — ABNORMAL LOW (ref 13.0–17.0)
MCH: 30.6 pg (ref 26.0–34.0)
MCHC: 33.8 g/dL (ref 30.0–36.0)
MCV: 90.4 fL (ref 78.0–100.0)
Platelets: 220 10*3/uL (ref 150–400)
RBC: 3.76 MIL/uL — ABNORMAL LOW (ref 4.22–5.81)
RDW: 13.7 % (ref 11.5–15.5)
WBC: 11.8 10*3/uL — ABNORMAL HIGH (ref 4.0–10.5)

## 2014-06-28 MED ORDER — BISACODYL 10 MG RE SUPP
10.0000 mg | Freq: Once | RECTAL | Status: AC
  Administered 2014-06-28: 10 mg via RECTAL
  Filled 2014-06-28: qty 1

## 2014-06-28 MED ORDER — BISACODYL 10 MG RE SUPP: 10.0000 mg | Freq: Once | RECTAL | Status: AC

## 2014-06-28 NOTE — Progress Notes (Signed)
Physical Therapy Treatment Patient Details Name: [name] MRN: [id number] DOB: 12/21/1935 Today's Date: 06/28/2014    History of Present Illness s/p R TKA    PT Comments    POD # 3 am session.  Pt still progressing slowly with mobility requiring Min Assist with transfers and getting in/out of bed.  Severe posterior lean with initial sit to stand and very slow corrective reaction to center self to mid line.  Once upright and centered, pt amb at [facility] assist but very slow and short steps.  Difficulty initiating and delayed motor control.  Amb in hallway then returned to room to perform TKR TE's followed by ICE.  AAROM R knee flex 86 degress after 10 reps as pt demon rigidity/tightness/guarding. Spouse is feeling unsure if she will be able to asssit pt at this level and is interested in [place] at SNF. Will return for pm session to perform stairs.  Follow Up Recommendations  Home health PT;SNF (due to slow progress, pt and spouse starting to think about poss of short term rehab)     Equipment Recommendations       Recommendations for Other Services       Precautions / Restrictions Precautions Precautions: Knee Restrictions Weight Bearing Restrictions: No Other Position/Activity Restrictions: WBAT    Mobility  Bed Mobility Overal bed mobility: Needs Assistance Bed Mobility: Supine to Sit;Sit to Supine     Supine to sit: Mod assist     General bed mobility comments: increased time and assist with R LE on and off bed.  Transfers Overall transfer level: Needs assistance Equipment used: Rolling walker (2 wheeled) Transfers: Sit to/from Stand Sit to Stand: Min assist         General transfer comment: cues for UE/LE placement plus increased time.  Stand to sit pt requires cueing both hands to reach back and control decend.  Pt has a tendency to "plop'.  Ambulation/Gait Ambulation/Gait assistance: Min assist Ambulation Distance (Feet): 65  Feet Assistive device: Rolling walker (2 wheeled) Gait Pattern/deviations: Step-to pattern Gait velocity: decreased speed and difficulty with initiation.  Slow corrective rersponce makes him a higher fall risk.   General Gait Details: demonstrates a "rigid" gait with short steps and difficulty initiating.  Takes pt several steps before he is able to increase his stride.   Stairs            Wheelchair Mobility    Modified Rankin (Stroke Patients Only)       Balance                                    Cognition Arousal/Alertness: Awake/alert Behavior During Therapy: WFL for tasks assessed/performed Overall Cognitive Status: Within Functional Limits for tasks assessed                      Exercises   Total Knee Replacement TE's 10 reps B LE ankle pumps 10 reps towel squeezes 10 reps knee presses 10 reps heel slides  10 reps SAQ's 10 reps SLR's 10 reps ABD Followed by ICE     General Comments        Pertinent Vitals/Pain Pain Assessment: 0-10 Pain Score: 5  Pain Location: R knee Pain Descriptors / Indicators: Aching Pain Intervention(s): Limited activity within patient's tolerance;Monitored during session;Repositioned (had just removed ice)    Home Living  Prior Function            PT Goals (current goals can now be found in the care plan section) Progress towards PT goals: Progressing toward goals (slowly)    Frequency  7X/week    PT Plan      Co-evaluation             End of Session Equipment Utilized During Treatment: Gait belt Activity Tolerance: Patient tolerated treatment well Patient left: in bed;with call bell/phone within reach;with family/visitor present     Time: 1026-1109 PT Time Calculation (min): 43 min  Charges:  $Gait Training: 8-22 mins $Therapeutic Exercise: 8-22 mins $Therapeutic Activity: 8-22 mins                    G Codes:      Rica Koyanagi  PTA [facility]  Acute   Rehab Pager      [phone]

## 2014-06-28 NOTE — Progress Notes (Signed)
Occupational Therapy Treatment Patient Details Name: [name] MRN: [id number] DOB: 1936-04-05 Today's Date: 06/28/2014    History of present illness s/p R TKA   OT comments  Spoke with pt and wife regarding d/c plans. She would like another visit with OT this pm. Relayed request to OT covering this pm. Also would like to have wife practice hands on with functional transfers if possible to determine if she will be able to manage pt at home. Nursing aware of OT attempting to check back and also of wife's concern regarding home versus exploring SNF.    Follow Up Recommendations  Home health OT;Supervision/Assistance - 24 hour    Equipment Recommendations  None recommended by OT    Recommendations for Other Services      Precautions / Restrictions Precautions Precautions: Knee Required Braces or Orthoses: Knee Immobilizer - Right Knee Immobilizer - Right: Discontinue once straight leg raise with < 10 degree lag Restrictions Weight Bearing Restrictions: No Other Position/Activity Restrictions: WBAT       Mobility Bed Mobility                   Balance                                   ADL                                      General ADL Comments: Spoke with pt and wife again regarding how they feel pt is progressing. Pts wife is still concerned about going home and being able to care for pt. She is still open to exploring SNF. She would like for pt to have another PT and OT session this pm. Spoke to nursing who is aware that OT will attempt to check back again this afternoon. WIill attempt to have wife provide the hands on assist with functional transfers during session to see if she feels she can manage pt at home. Discussed waiting for [place] (if pt d/c home) to assess shower transfer as OT does not feel pt is ready for showers yet. He fatigues easily with activity and is still needing assist to safely transfer on and off 3in1 and  feel shower poses more of a safety risk with patient fatiguing. Explained that pt needs KI when up right now as pt not able to SLR so he would have to wear KI to transer in and out of the shower right now anyway. Wife and pt both verbalize understanding that it will be better for pt to sponge bathe initially and let [facility] address shower transfer. Did educate pt and wife on options of how to place 3in1 in shower as she was concerned about the space  in the shower. She verbalizes understanding of recommendations and reports they do have a HH shower also.       Vision                     Perception     Praxis      Cognition   Behavior During Therapy: [facility] for tasks assessed/performed Overall Cognitive Status: Within Functional Limits for tasks assessed                       Extremity/Trunk Assessment  Exercises     Shoulder Instructions       General Comments      Pertinent Vitals/ Pain       Pain Assessment: 0-10 Pain Score: 3  Pain Location: R knee Pain Descriptors / Indicators: Aching Pain Intervention(s): Repositioned;Ice applied  Home Living                                          Prior Functioning/Environment              Frequency Min 2X/week     Progress Toward Goals  OT Goals(current goals can now be found in the care plan section)  Progress towards OT goals: Progressing toward goals     Plan Discharge plan remains appropriate    Co-evaluation                 End of Session Equipment Utilized During Treatment: Gait belt;Rolling walker;Right knee immobilizer   Activity Tolerance Patient tolerated treatment well   Patient Left in chair;with call bell/phone within reach;with family/visitor present   Nurse Communication          Time: 1145-1155 OT Time Calculation (min): 10 min  Charges: OT General Charges $OT Visit: 1 Procedure OT Treatments $Self Care/Home Management : 8-22  mins $Therapeutic Activity: 8-22 mins  Jules Schick [phone] 06/28/2014, 1:02 PM

## 2014-06-28 NOTE — Progress Notes (Signed)
Occupational Therapy Treatment Patient Details Name: [name] MRN: [id number] DOB: May 19, 1936 Today's Date: 06/28/2014    History of present illness s/p R TKA   OT comments  Pt more alert and following commands better today. He reports feeling less stiff today during functional tasks. Wife is wanting to explore option of SNF depending on progress today as she state she is unable to provide physical assist. Pt progressing with session today but does have some difficulty still with transitions on and off surfaces especially with the 3in1. Will attempt to see again today for more practice. Nursing aware of how OT session went today and wife wanting to explore [facility] versus SNF. Recommend HHOT if does d/c home to reinforce education.   Follow Up Recommendations  Home health OT;Supervision/Assistance - 24 hour    Equipment Recommendations  None recommended by OT    Recommendations for Other Services      Precautions / Restrictions Precautions Precautions: Knee Required Braces or Orthoses: Knee Immobilizer - Right Knee Immobilizer - Right: Discontinue once straight leg raise with < 10 degree lag Restrictions Weight Bearing Restrictions: No Other Position/Activity Restrictions: WBAT       Mobility Bed Mobility                  Transfers Overall transfer level: Needs assistance Equipment used: Rolling walker (2 wheeled) Transfers: Sit to/from Stand Sit to Stand: Min assist         General transfer comment: varies on assist with sit to stand and stand to sit. min guard to min assist and verbal cues for hand placement and LE management. Min guard assist to sit in recliner but min assist to safely descend on 3in1 in bathroom. Pt does report feeling less stiff today. Discussed with pt and wife again the proper hand placement for transfers and safe walker use.    Balance                                   ADL                           Toilet  Transfer: Minimal assistance;Ambulation;BSC;RW Toilet Transfer Details (indicate cue type and reason): see notes below Toileting- Water quality scientist and Hygiene: Min guard;Sit to/from stand         General ADL Comments: Pt still reporting stiffness today but better than yesterday. He did require 2 attempts to stand from recliner with min guard assist and increased time and then needed min assist to safely descend to toilet. He did better with descending to recliner with min guard assist and verbal cues. He is able to stand and let go of walker to perform grooming task with no LOB as well as clothing management with min guard assist. Wife stating she would like to consider SNF option and reports she is not able to lift pt at home. Pt able to transfer on and off toilet  in a more timely manner compared to yesterday also. He does well once up but tends to have slight difficulty with the sit to stand and stand to sit transitions at times.       Vision                     Perception     Praxis      Cognition   Behavior During Therapy:  WFL for tasks assessed/performed Overall Cognitive Status: Within Functional Limits for tasks assessed                       Extremity/Trunk Assessment               Exercises     Shoulder Instructions       General Comments      Pertinent Vitals/ Pain       Pain Assessment: 0-10 Pain Score: 3  Pain Location: R knee Pain Descriptors / Indicators: Aching Pain Intervention(s): Repositioned;Ice applied  Home Living                                          Prior Functioning/Environment              Frequency Min 2X/week     Progress Toward Goals  OT Goals(current goals can now be found in the care plan section)  Progress towards OT goals: Progressing toward goals     Plan Discharge plan remains appropriate    Co-evaluation                 End of Session Equipment Utilized During  Treatment: Gait belt;Rolling walker;Right knee immobilizer   Activity Tolerance Patient tolerated treatment well   Patient Left in chair;with call bell/phone within reach;with family/visitor present   Nurse Communication          Time: 2197-5883 OT Time Calculation (min): 29 min  Charges: OT General Charges $OT Visit: 1 Procedure OT Treatments $Self Care/Home Management : 8-22 mins $Therapeutic Activity: 8-22 mins  Jules Schick [phone] 06/28/2014, 12:51 PM

## 2014-06-28 NOTE — Progress Notes (Signed)
   Subjective: 3 Days Post-Op Procedure(s) (LRB): RIGHT TOTAL KNEE ARTHROPLASTY (Right) Patient reports pain as mild.   Patient seen in rounds by Dr. Wynelle Link.  He had some bowel movements last night and doing a little better.  Will try anothter suppoistiory this morning and plan on home later today. Patient is well, but has had some minor complaints of pain in the knee, requiring pain medications Patient is ready to go home later today.  Objective: Vital signs in last 24 hours: Temp:  [98.7 F (37.1 C)-99.7 F (37.6 C)] 99.3 F (37.4 C) (10/08 0438) Pulse Rate:  [70-90] 70 (10/08 0438) Resp:  [14-20] 16 (10/08 0800) BP: (117-156)/(54-71) 120/58 mmHg (10/08 0438) SpO2:  [94 %-98 %] 97 % (10/08 0438)  Intake/Output from previous day:  Intake/Output Summary (Last 24 hours) at 06/28/14 0823 Last data filed at 06/28/14 0439  Gross per 24 hour  Intake    960 ml  Output   1203 ml  Net   -243 ml    Intake/Output this shift:    Labs:  Recent Labs  06/26/14 0441 06/27/14 0415 06/28/14 0501  HGB 13.2 12.3* 11.5*    Recent Labs  06/27/14 0415 06/28/14 0501  WBC 12.3* 11.8*  RBC 4.04* 3.76*  HCT 37.1* 34.0*  PLT 193 220    Recent Labs  06/26/14 0441 06/27/14 0415  NA 138 132*  K 4.0 4.3  CL 101 98  CO2 25 23  BUN 14 15  CREATININE 0.92 1.02  GLUCOSE 132* 111*  CALCIUM 8.5 8.7   No results found for this basename: LABPT, INR,  in the last 72 hours  EXAM: General - Patient is Alert, Appropriate and Oriented Extremity - Neurovascular intact Sensation intact distally Dorsiflexion/Plantar flexion intact Incision - clean, dry, no drainage, healing Motor Function - intact, moving foot and toes well on exam.   Assessment/Plan: 3 Days Post-Op Procedure(s) (LRB): RIGHT TOTAL KNEE ARTHROPLASTY (Right) Procedure(s) (LRB): RIGHT TOTAL KNEE ARTHROPLASTY (Right) Past Medical History  Diagnosis Date  . Hypertension   . Hypothyroidism   . Hyperlipidemia   . BPH  (benign prostatic hypertrophy)   . History of colonic polyps   . Skin cancer (melanoma)     PMH of   . Coronary artery disease     DES Circumflex or CVA 2006  /  clear, February, 2011, EF 70%, no ischemia or  . Venous insufficiency   . Carotid artery disease     Doppler, December 08, 2011, 00 95% R. ICA, 62-13% LICA, followup 1 year  . Tremor     Fine tremor right upper extremity  . Ejection fraction     EF 60%, echo, 2009, mildly calcified aortic leaflets  . Multiple thyroid nodules     Avascular echogenic areas noted in the right thyroid at the time of carotid Doppler.   . Arthritis   . History of nonmelanoma skin cancer    Principal Problem:   OA (osteoarthritis) of knee  Estimated body mass index is 29.2 kg/(m^2) as calculated from the following:   Height as of this encounter: 5\' 8"  (1.727 m).   Weight as of this encounter: 87.091 kg (192 lb). Up with therapy Discharge home with home health Diet - Cardiac diet Follow up - in 2 weeks Activity - WBAT Disposition - Home Condition Upon Discharge - improving D/C Meds - See DC Summary DVT Prophylaxis - Xarelto  [name] Muslim, PA-C Orthopaedic Surgery 06/28/2014, 8:23 AM

## 2014-06-28 NOTE — Progress Notes (Signed)
Occupational Therapy Treatment Patient Details Name: [name] MRN: [id number] DOB: April 16, 1936 Today's Date: 06/28/2014    History of present illness s/p R TKA   OT comments  Pt seen for family education.  Wife assisted but still does not feel 100% confident about helping pt.  Recommend SNF.  Follow Up Recommendations  SNF    Equipment Recommendations  None recommended by OT    Recommendations for Other Services      Precautions / Restrictions Precautions Precautions: Knee       Mobility Bed Mobility         Supine to sit: Mod assist     General bed mobility comments: used leg lifter.  needed cues for turning hips on bed and lifting good leg up on bed:  pt laid head down and couldn't follow cues to lift leg onto bed; physical assistance given  Transfers Overall transfer level: Needs assistance Equipment used: Rolling walker (2 wheeled) Transfers: Sit to/from Stand Sit to Stand: Min assist         General transfer comment: cues for UE/LE placement    Balance                                   ADL                           Toilet Transfer: Minimal assistance;Ambulation;BSC;RW             General ADL Comments: Wife present for hands on education.  For first transfer out of chair, I had to do cueing and provide min A to boost up from chair.  Wife assisted placing KI and became more hands on as session progressed.  She states she is feeling a little more comfortable but she still is concerned about helping pt go from sit to stand and also helping with bed mobility.  wife assisted pt with standing from 3:1 commode as well as chair once more.  i assisted with bed mobility, & cueing pt.  Used and recommended gait belt this pm.  PT will work with wife this pm.      Tourist information centre manager   Behavior During Therapy: WFL for tasks assessed/performed Overall Cognitive Status:  Within Functional Limits for tasks assessed                       Extremity/Trunk Assessment               Exercises     Shoulder Instructions       General Comments      Pertinent Vitals/ Pain       Pain Assessment: 0-10 Pain Score: 5  Pain Location: R knee Pain Descriptors / Indicators: Aching Pain Intervention(s): Limited activity within patient's tolerance;Monitored during session;Repositioned (had just removed ice)  Home Living                                          Prior Functioning/Environment              Frequency Min 2X/week  Progress Toward Goals  OT Goals(current goals can now be found in the care plan section)  Progress towards OT goals: Progressing toward goals (slowly)     Plan Discharge plan remains appropriate    Co-evaluation                 End of Session Equipment Utilized During Treatment: Gait belt;Rolling walker;Right knee immobilizer   Activity Tolerance Patient tolerated treatment well   Patient Left in bed;with call bell/phone within reach;with family/visitor present   Nurse Communication  (status of session)        Time: 4503-8882 OT Time Calculation (min): 34 min  Charges: OT General Charges $OT Visit: 1 Procedure OT Treatments $Self Care/Home Management : 8-22 mins $Therapeutic Activity: 8-22 mins  [name] 06/28/2014, 3:33 PM Lesle Chris, OTR/L [phone] 06/28/2014

## 2014-06-28 NOTE — Progress Notes (Signed)
Clinical Social Work Department BRIEF PSYCHOSOCIAL ASSESSMENT 06/28/2014  Patient:  [name], [name]     Account Number:  [id number]     Admit date:  06/25/2014  Clinical Social Worker:  Lacie Scotts  Date/Time:  06/28/2014 04:58 PM  Referred by:  Physician  Date Referred:  06/28/2014 Referred for  SNF Placement   Other Referral:   Interview type:  Patient Other interview type:    PSYCHOSOCIAL DATA Living Status:  WIFE Admitted from facility:   Level of care:   Primary support name:  Thayer Headings Primary support relationship to patient:  SPOUSE Degree of support available:   supportive    CURRENT CONCERNS Current Concerns  Post-Acute Placement   Other Concerns:    SOCIAL WORK ASSESSMENT / PLAN Pt is a 78 yr old gentleman living at home with spouse prior to hospitalization. CSW met with pt / spouse to assist with d/c planning. Pt was hoping to return home following hospital d/c. PT / OT have recommended ST Rehab due to pt's slow progression with therapy. Pt/spouse are in agreement with plan for rehab and SNF search has been initiated. Bed offers are pending.   Assessment/plan status:  Psychosocial Support/Ongoing Assessment of Needs Other assessment/ plan:   Information/referral to community resources:   Insurance coverage for SNF and ambulance transport have been reviewed.    PATIENT'S/FAMILY'S RESPONSE TO PLAN OF CARE: Pt is disappointed with his slow progression but is motivated to continue working with therapist.     Werner Lean LCSW [phone]

## 2014-06-28 NOTE — Progress Notes (Signed)
Physical Therapy Treatment Patient Details Name: [name] MRN: [id number] DOB: 1936-09-06 Today's Date: 06/28/2014    History of Present Illness s/p R TKA    PT Comments    POD # 3 pm session.  Had spouse perform all assist activity such as in/out bed, toileting, amb and stairs.  Pt still progressing slowly and demonstrates difficulty initiating mvts, delayed motor control and delayed corrective reaction response to balance. His gait is rigid and his steppage is shuffled.  I asked him if he has ever seen a Neurologist.  He stated he has not.  I quickly switched the topic to something else.  Have discussed above with OT to see similar findings.  At this point, pt will need ST Rehab at [facility] and spouse/pt agree. Pt is NOT at a safe mobility level to return home. Pt would also benefit from a Neuro Consult if symptoms continue.   Follow Up Recommendations  SNF ([place] if poss due to location)     Equipment Recommendations       Recommendations for Other Services       Precautions / Restrictions Precautions Precautions: Knee Restrictions Weight Bearing Restrictions: No Other Position/Activity Restrictions: WBAT    Mobility  Bed Mobility Overal bed mobility: Needs Assistance Bed Mobility: Supine to Sit;Sit to Supine     Supine to sit: Mod assist     General bed mobility comments: demonstrated and instructed pt how to use a leg lifter to self asssit r LE off and on to bed.   Transfers Overall transfer level: Needs assistance Equipment used: Rolling walker (2 wheeled) Transfers: Sit to/from Stand Sit to Stand: Min assist         General transfer comment: cues for UE/LE placement plus increased time.  Stand to sit pt requires cueing both hands to reach back and control decend.  Pt has a tendency to "plop'. Slow.  Ambulation/Gait Ambulation/Gait assistance: Min assist Ambulation Distance (Feet): 65 Feet Assistive device: Rolling walker (2 wheeled) Gait  Pattern/deviations: Step-to pattern Gait velocity: decreased speed and difficulty with initiation.  Slow corrective rersponce makes him a higher fall risk.   General Gait Details: demonstrates a "rigid" gait with short steps and difficulty initiating.  Takes pt several steps before he is able to increase his stride. VC's to increase steps length.  Once in hallway in open space, pt's gait speed increases.   Stairs Stairs: Yes Stairs assistance: Max assist Stair Management: One rail Left;Step to pattern;Forwards;With crutches Number of Stairs: 4 General stair comments: attempted performance with spouse however pt performed poorly.  Great difficulty coordinating sequencing and severe posterior lean with rigidity throughout.  Poor self correction to mid line. Required MAX assist to perform with near fall at last step.    Wheelchair Mobility    Modified Rankin (Stroke Patients Only)       Balance                                    Cognition Arousal/Alertness: Awake/alert Behavior During Therapy: WFL for tasks assessed/performed Overall Cognitive Status: Within Functional Limits for tasks assessed                      Exercises      General Comments        Pertinent Vitals/Pain Pain Assessment: 0-10 Pain Score: 5  Pain Location: R knee Pain Descriptors / Indicators: Aching  Pain Intervention(s): Limited activity within patient's tolerance;Monitored during session;Repositioned (had just removed ice)    Home Living                      Prior Function            PT Goals (current goals can now be found in the care plan section) Progress towards PT goals: Progressing toward goals (slowly)    Frequency  7X/week    PT Plan      Co-evaluation             End of Session Equipment Utilized During Treatment: Gait belt Activity Tolerance: Patient tolerated treatment well Patient left: in bed;with call bell/phone within reach;with  family/visitor present     Time: 2585-2778 PT Time Calculation (min): 46 min  Charges:  $Gait Training: 23-37 mins $Therapeutic Activity: 8-22 mins                    G Codes:      Rica Koyanagi  PTA [facility]  Acute  Rehab Pager      [phone]

## 2014-06-28 NOTE — Progress Notes (Signed)
CARE MANAGEMENT NOTE 06/28/2014  Patient:  [name], [name]   Account Number:  [id number]  Date Initiated:  06/26/2014  Documentation initiated by:  [facility]  Subjective/Objective Assessment:   adm: RIGHT TOTAL KNEE ARTHROPLASTY (Right)     Action/Plan:   discharge planning   Anticipated DC Date:  06/29/2014   Anticipated DC Plan:  [place]  CM consult      [facility] Choice  HOME HEALTH   Choice offered to / List presented to:  C-1 Patient   DME arranged  NA      DME agency  NA     [place] arranged  HH-2 PT      [place]   Status of service:  Completed, signed off Medicare Important Message given?  YES (If response is "NO", the following Medicare IM given date fields will be blank) Date Medicare IM given:  06/28/2014 Medicare IM given by:   Date Additional Medicare IM given:   Additional Medicare IM given by:    Discharge Disposition:  [place]  Per UR Regulation:    If discussed at Long Length of Stay Meetings, dates discussed:    Comments:  06/28/14 15:00 CM was notified by RN pt will be going to SNF for rehab.  RN notifying CSW to arrange.  CM called [facility] to notify of change of plan.  No other CM needs communicated.  Mariane Masters, BSN, [place].  06/26/14 08:00 CM met with pt in room to offer choice of home health agency.  Pt chooses [facility] to render HHPT.  Pt states he does not need any DME as he has all he needs from previous surgery.  Address and contact information verified with pt.  Referral called to [facility] Lee[facility].  No other CM needs were communicated.  Mariane Masters, BSN, CM [phone].

## 2014-06-28 NOTE — Progress Notes (Signed)
Clinical Social Work Department CLINICAL SOCIAL WORK PLACEMENT NOTE 06/28/2014  Patient:  [name], [name]  Account Number:  [id number] Admit date:  06/25/2014  Clinical Social Worker:  Werner Lean, LCSW  Date/time:  06/28/2014 05:15 PM  Clinical Social Work is seeking post-discharge placement for this patient at the following level of care:   SKILLED NURSING   (*CSW will update this form in Epic as items are completed)     Patient/family provided with [place] Department of Clinical Social Work's list of facilities offering this level of care within the geographic area requested by the patient (or if unable, by the patient's family).  06/28/2014  Patient/family informed of their freedom to choose among providers that offer the needed level of care, that participate in Medicare, Medicaid or managed care program needed by the patient, have an available bed and are willing to accept the patient.    Patient/family informed of [facility]' ownership interest in [facility], as well as of the fact that they are under no obligation to receive care at this facility.  PASARR submitted to EDS on 06/28/2014 PASARR number received on 06/28/2014  FL2 transmitted to all facilities in geographic area requested by pt/family on  06/28/2014 FL2 transmitted to all facilities within larger geographic area on   Patient informed that his/her managed care company has contracts with or will negotiate with  certain facilities, including the following:     Patient/family informed of bed offers received:   Patient chooses bed at  Physician recommends and patient chooses bed at    Patient to be transferred to  on   Patient to be transferred to facility by  Patient and family notified of transfer on  Name of family member notified:    The following physician request were entered in Epic:   Additional Comments:  Werner Lean LCSW [phone]

## 2014-06-29 MED ORDER — CALCIUM CARBONATE ANTACID 500 MG PO CHEW: 1.0000 | CHEWABLE_TABLET | Freq: Three times a day (TID) | ORAL | Status: DC

## 2014-06-29 MED ORDER — POLYETHYLENE GLYCOL 3350 17 G PO PACK: 17.0000 g | PACK | Freq: Every day | ORAL | Status: DC | PRN

## 2014-06-29 MED ORDER — ALUM & MAG HYDROXIDE-SIMETH 200-200-20 MG/5ML PO SUSP: 30.0000 mL | ORAL | Status: DC | PRN

## 2014-06-29 MED ORDER — RIVAROXABAN 10 MG PO TABS: 10.0000 mg | ORAL_TABLET | Freq: Every day | ORAL | Status: DC

## 2014-06-29 MED ORDER — OXYCODONE HCL 5 MG PO TABS: 5.0000 mg | ORAL_TABLET | ORAL | Status: DC | PRN

## 2014-06-29 MED ORDER — ONDANSETRON HCL 4 MG PO TABS: 4.0000 mg | ORAL_TABLET | Freq: Four times a day (QID) | ORAL | Status: DC | PRN

## 2014-06-29 MED ORDER — BISACODYL 10 MG RE SUPP: 10.0000 mg | Freq: Every day | RECTAL | Status: DC | PRN

## 2014-06-29 MED ORDER — TRAMADOL HCL 50 MG PO TABS: 50.0000 mg | ORAL_TABLET | Freq: Four times a day (QID) | ORAL | Status: DC | PRN

## 2014-06-29 MED ORDER — METHOCARBAMOL 500 MG PO TABS: 500.0000 mg | ORAL_TABLET | Freq: Four times a day (QID) | ORAL | Status: DC | PRN

## 2014-06-29 MED ORDER — DSS 100 MG PO CAPS: 100.0000 mg | ORAL_CAPSULE | Freq: Two times a day (BID) | ORAL | Status: DC

## 2014-06-29 MED ORDER — ACETAMINOPHEN 325 MG PO TABS: 650.0000 mg | ORAL_TABLET | Freq: Four times a day (QID) | ORAL | Status: DC | PRN

## 2014-06-29 NOTE — Progress Notes (Signed)
   Subjective: 4 Days Post-Op Procedure(s) (LRB): RIGHT TOTAL KNEE ARTHROPLASTY (Right) Patient reports pain as mild.   Patient seen in rounds with Dr. Wynelle Link.  He is sitting up in bed, no complaints, in good spirits. Patient is well, and has had no acute complaints or problems Patient is ready to go to the SNF for further rehab.  Objective: Vital signs in last 24 hours: Temp:  [98.3 F (36.8 C)-100.2 F (37.9 C)] 99.3 F (37.4 C) (10/09 0518) Pulse Rate:  [76-97] 76 (10/09 0518) Resp:  [16-20] 18 (10/09 0518) BP: (125-151)/(54-67) 131/64 mmHg (10/09 0518) SpO2:  [93 %-99 %] 99 % (10/09 0518)  Intake/Output from previous day:  Intake/Output Summary (Last 24 hours) at 06/29/14 0855 Last data filed at 06/29/14 0831  Gross per 24 hour  Intake    480 ml  Output   2677 ml  Net  -2197 ml    Intake/Output this shift: Total I/O In: 240 [P.O.:240] Out: 350 [Urine:350]  Labs:  Recent Labs  06/27/14 0415 06/28/14 0501  HGB 12.3* 11.5*    Recent Labs  06/27/14 0415 06/28/14 0501  WBC 12.3* 11.8*  RBC 4.04* 3.76*  HCT 37.1* 34.0*  PLT 193 220    Recent Labs  06/27/14 0415  NA 132*  K 4.3  CL 98  CO2 23  BUN 15  CREATININE 1.02  GLUCOSE 111*  CALCIUM 8.7   No results found for this basename: LABPT, INR,  in the last 72 hours  EXAM: General - Patient is Alert, Appropriate and Oriented Extremity - Neurovascular intact Sensation intact distally Dorsiflexion/Plantar flexion intact Incision - clean, dry, no drainage, healing Motor Function - intact, moving foot and toes well on exam.   Assessment/Plan: 4 Days Post-Op Procedure(s) (LRB): RIGHT TOTAL KNEE ARTHROPLASTY (Right) Procedure(s) (LRB): RIGHT TOTAL KNEE ARTHROPLASTY (Right) Past Medical History  Diagnosis Date  . Hypertension   . Hypothyroidism   . Hyperlipidemia   . BPH (benign prostatic hypertrophy)   . History of colonic polyps   . Skin cancer (melanoma)     PMH of   . Coronary artery  disease     DES Circumflex or CVA 2006  /  clear, February, 2011, EF 70%, no ischemia or  . Venous insufficiency   . Carotid artery disease     Doppler, December 08, 2011, 00 25% R. ICA, 42-70% LICA, followup 1 year  . Tremor     Fine tremor right upper extremity  . Ejection fraction     EF 60%, echo, 2009, mildly calcified aortic leaflets  . Multiple thyroid nodules     Avascular echogenic areas noted in the right thyroid at the time of carotid Doppler.   . Arthritis   . History of nonmelanoma skin cancer    Principal Problem:   OA (osteoarthritis) of knee  Estimated body mass index is 29.2 kg/(m^2) as calculated from the following:   Height as of this encounter: 5\' 8"  (1.727 m).   Weight as of this encounter: 87.091 kg (192 lb). Up with therapy Discharge to SNF Diet - Cardiac diet Follow up - in 2 weeks Activity - WBAT Disposition - Skilled nursing facility Condition Upon Discharge - Good D/C Meds - See DC Summary DVT Prophylaxis - Xarelto for a total of three weeks and then will resume his 325 mg Aspirin at home.  [name], PA-C Orthopaedic Surgery 06/29/2014, 8:55 AM

## 2014-06-29 NOTE — Progress Notes (Signed)
Physical Therapy Treatment Patient Details Name: [name] MRN: [id number] DOB: 1936-07-29 Today's Date: 06/29/2014    History of Present Illness s/p R TKA    PT Comments    POD # 4 am session.  Pt dressed.  Assisted with amb in hallway then returned to room to perform TKR TE's followed by ICE.  Pt plans to D/C to [facility] today for ST Rehab.  Follow Up Recommendations  SNF     Equipment Recommendations       Recommendations for Other Services       Precautions / Restrictions Precautions Precautions: Knee Restrictions Weight Bearing Restrictions: No Other Position/Activity Restrictions: WBAT    Mobility  Bed Mobility               General bed mobility comments: Pt OOB in recliner  Transfers Overall transfer level: Needs assistance Equipment used: Rolling walker (2 wheeled) Transfers: Sit to/from Stand Sit to Stand: Min assist         General transfer comment: one VC on proper tech and hand placement.  Pt required increased time and assist to control decend with stand to sit.  Ambulation/Gait Ambulation/Gait assistance: Min guard Ambulation Distance (Feet): 52 Feet Assistive device: Rolling walker (2 wheeled) Gait Pattern/deviations: Step-to pattern;Trunk flexed Gait velocity: decreased but increased speed from yesterday   General Gait Details: increased time and 25% VC's safety with turns and proper walker to self distance.   Stairs            Wheelchair Mobility    Modified Rankin (Stroke Patients Only)       Balance                                    Cognition                            Exercises      General Comments        Pertinent Vitals/Pain      Home Living                      Prior Function            PT Goals (current goals can now be found in the care plan section) Progress towards PT goals: Progressing toward goals    Frequency  7X/week    PT Plan       Co-evaluation             End of Session Equipment Utilized During Treatment: Gait belt Activity Tolerance: Patient tolerated treatment well Patient left: in chair;with call bell/phone within reach;with family/visitor present     Time: 1975-8832 PT Time Calculation (min): 26 min  Charges:  $Gait Training: 8-22 mins $Therapeutic Exercise: 8-22 mins                    G Codes:      Rica Koyanagi  PTA [facility]  Acute  Rehab Pager      [phone]

## 2014-06-29 NOTE — Progress Notes (Signed)
Clinical Social Work Department CLINICAL SOCIAL WORK PLACEMENT NOTE 06/29/2014  Patient:  [name], [name]  Account Number:  [id number] Admit date:  06/25/2014  Clinical Social Worker:  Werner Lean, LCSW  Date/time:  06/28/2014 05:15 PM  Clinical Social Work is seeking post-discharge placement for this patient at the following level of care:   SKILLED NURSING   (*CSW will update this form in Epic as items are completed)     Patient/family provided with [place] Department of Clinical Social Work's list of facilities offering this level of care within the geographic area requested by the patient (or if unable, by the patient's family).  06/28/2014  Patient/family informed of their freedom to choose among providers that offer the needed level of care, that participate in Medicare, Medicaid or managed care program needed by the patient, have an available bed and are willing to accept the patient.    Patient/family informed of [facility]' ownership interest in [facility], as well as of the fact that they are under no obligation to receive care at this facility.  PASARR submitted to EDS on 06/28/2014 PASARR number received on 06/28/2014  FL2 transmitted to all facilities in geographic area requested by pt/family on  06/28/2014 FL2 transmitted to all facilities within larger geographic area on   Patient informed that his/her managed care company has contracts with or will negotiate with  certain facilities, including the following:     Patient/family informed of bed offers received:  06/29/2014 Patient chooses bed at [place] Physician recommends and patient chooses bed at    Patient to be transferred to [place] on  06/29/2014 Patient to be transferred to facility by FAMILY Patient and family notified of transfer on 06/29/2014 Name of family member notified:  SPOUSE  The following physician request were entered in  Epic:   Additional Comments: Pt / spouse are in agreement with d/c to SNF today. Pt is able to transport by car. Claiborne Billings, from [facility] reviewed insurance benefits and found that in network and out of network coverage to be similar . Claiborne Billings did not believe pt would have a copayment for placement. This info was shared with pt / spouse prior to d/c. NSG reviewed d/c summary, scripts, avs. Scripts included in d/c packet.  Werner Lean LCSW [phone]

## 2014-07-17 ENCOUNTER — Ambulatory Visit: Payer: Self-pay | Admitting: Orthopedic Surgery

## 2014-08-10 ENCOUNTER — Encounter: Payer: Self-pay | Admitting: Cardiology

## 2014-09-18 ENCOUNTER — Ambulatory Visit (INDEPENDENT_AMBULATORY_CARE_PROVIDER_SITE_OTHER): Payer: Medicare Other | Admitting: Internal Medicine

## 2014-09-18 ENCOUNTER — Encounter: Payer: Self-pay | Admitting: Internal Medicine

## 2014-09-18 ENCOUNTER — Other Ambulatory Visit (INDEPENDENT_AMBULATORY_CARE_PROVIDER_SITE_OTHER): Payer: Medicare Other

## 2014-09-18 VITALS — BP 100/66 | HR 63 | Temp 98.4°F | Wt 193.2 lb

## 2014-09-18 DIAGNOSIS — I10 Essential (primary) hypertension: Secondary | ICD-10-CM

## 2014-09-18 DIAGNOSIS — R609 Edema, unspecified: Secondary | ICD-10-CM

## 2014-09-18 LAB — BASIC METABOLIC PANEL
BUN: 17 mg/dL (ref 6–23)
CO2: 28 meq/L (ref 19–32)
CREATININE: 1.1 mg/dL (ref 0.4–1.5)
Calcium: 9.6 mg/dL (ref 8.4–10.5)
Chloride: 104 mEq/L (ref 96–112)
GFR: 72.1 mL/min (ref >60.00)
Glucose, Bld: 87 mg/dL (ref 70–99)
Potassium: 4.8 mEq/L (ref 3.5–5.1)
Sodium: 140 mEq/L (ref 135–145)

## 2014-09-18 MED ORDER — FUROSEMIDE 40 MG PO TABS: 40.0000 mg | ORAL_TABLET | Freq: Every day | ORAL | Status: DC

## 2014-09-18 NOTE — Patient Instructions (Addendum)
Your next office appointment will be determined based upon review of your pending labs . Those instructions will be transmitted to you through My Chart    Followup as needed for your acute issue. Please report any significant change in your symptoms.  Your ideal BP goal = AVERAGE < 135/85.Minimal goal is average < 140/90. Avoid ingestion of  excess salt/sodium.Cook with pepper & other spices . Use the salt substitute "No Salt" OR the Mrs Deliah Boston products to season food @ the table. Avoid foods which taste salty or "vinegary" as their sodium content will be high.  Hold Xarelto  ( Lot [id number]; Exp 6/17) until notified by My Chart whether to start this.

## 2014-09-18 NOTE — Progress Notes (Signed)
Pre visit review using our clinic review tool, if applicable. No additional management support is needed unless otherwise documented below in the visit note. 

## 2014-09-18 NOTE — Progress Notes (Signed)
   Subjective:    Patient ID: [name], male    DOB: [birth year], 78 y.o.   MRN: [id number]  HPI   He's had increasing swelling in the right lower extremity greater than the left in the context of having had total knee replacement on the  right in October this year.  Postoperatively he was on prophylactic Xarelto ; but this has been discontinued.   He saw his Orthopedist who was concerned about the dramatic asymmetry of the swelling in the right lower extremity.  He has had increased sodium intake over the holidays,  ingesting ham & other salty foods. He is not on amlodipine  He has not been monitoring his blood pressure as his cuff is broken.  He denies any other cardiopulmonary symptoms  Review of Systems   Chest pain, palpitations, tachycardia, exertional dyspnea, paroxysmal nocturnal dyspnea, or claudication are absent.       Objective:   Physical Exam  Positive or pertinent findings include: There is 1+ pitting edema to the knee on the right. There is trace edema in  left lower extremity Fusiform changes are noted in the right knee Homans sign is negative bilaterally.  There is no tenderness to palpation in the popliteal space.  Appears healthy and well-nourished & in no acute distress  No carotid bruits are present.No neck vein distention present at 10 - 15 degrees. Thyroid normal to palpation  Heart rhythm and rate are normal with no gallop or murmur  Chest is clear with no increased work of breathing  There is no evidence of aortic aneurysm or renal artery bruits  Abdomen soft with no organomegaly or masses. No HJR  No clubbing, cyanosis present.  Pedal pulses are intact   No ischemic skin changes are present . Fingernails/ toenails healthy   Alert and oriented. Strength, tone, DTRs reflexes normal        Assessment & Plan:  #1 asymmetric extremity edema  #2 status post right total knee replacement October 2015  Plan: See orders  recommendations

## 2014-09-19 ENCOUNTER — Telehealth: Payer: Self-pay

## 2014-09-19 ENCOUNTER — Other Ambulatory Visit: Payer: Self-pay | Admitting: Internal Medicine

## 2014-09-19 ENCOUNTER — Ambulatory Visit (HOSPITAL_COMMUNITY)
Admission: RE | Admit: 2014-09-19 | Discharge: 2014-09-19 | Disposition: A | Discharge status: Home / Self Care | Payer: Medicare Other | Source: Ambulatory Visit | Attending: Cardiology | Admitting: Cardiology

## 2014-09-19 DIAGNOSIS — R609 Edema, unspecified: Secondary | ICD-10-CM | POA: Diagnosis present

## 2014-09-19 DIAGNOSIS — R7989 Other specified abnormal findings of blood chemistry: Secondary | ICD-10-CM

## 2014-09-19 DIAGNOSIS — R791 Abnormal coagulation profile: Secondary | ICD-10-CM | POA: Diagnosis not present

## 2014-09-19 LAB — D-DIMER, QUANTITATIVE: D-Dimer, Quant: 1.68 ug/mL-FEU — ABNORMAL HIGH (ref 0.00–0.48)

## 2014-09-19 NOTE — Progress Notes (Signed)
Bilateral Lower Extremity Venous Duplex Completed. No evidence for DVT or SVT. °Brianna L Mazza,RVT °

## 2014-09-19 NOTE — Telephone Encounter (Signed)
Mr [name] called. I returned his call. He was wondering what the directions are on the Xarelto. I advised him it should be taken twice daily and it does not have to be with food. He's been advised toe stop the Aspirin. We will let him know about his doppler results when we get those.

## 2014-09-21 ENCOUNTER — Encounter: Payer: Self-pay | Admitting: Internal Medicine

## 2014-09-24 ENCOUNTER — Telehealth: Payer: Self-pay | Admitting: Internal Medicine

## 2014-09-24 NOTE — Telephone Encounter (Signed)
Pt request phone call from the assistant concern about test result for venous doppler. Please call pt

## 2014-09-25 ENCOUNTER — Telehealth: Payer: Self-pay

## 2014-09-25 NOTE — Telephone Encounter (Signed)
-----   Message from Hendricks Limes, MD sent at 09/25/2014  8:45 AM EST ----- Excellent ; the Venous Doppler reveals no clots in legs. Unfortunately we were not called with results (see below). We had to call their office. This test was necessary due to the elevated D dimer , a screening but NOT diagnostic test for leg clots. Stop Xarelto & resume aspirin.Hopp   ----- Message -----    From: Dorian Pod    Sent: 09/25/2014   8:19 AM      To: Hendricks Limes, MD  Linus Orn, I spoke w/Rita @ cardiology office. She stated they always call for STAT orders while the patient is still in their office, however there is no documentation (that I can find) showing they tried to call us. There is this telephone encounter, but it was not routed to anyone.    Hulan Fray at 09/19/2014 1:26 PM    Status: Signed      Expand All Collapse All    Bilateral Lower Extremity Venous Duplex Completed. No evidence for DVT or SVT. [place] Ar L Mazza,RVT  Rita stated Dr. Gwenlyn Found has been out of the office and just returned today. She will have him sign the report and add it to the patient's chart ASAP.    ----- Message -----    From: Hendricks Limes, MD    Sent: 09/24/2014   6:42 PM      To: Dorian Pod, Shelly Coss, CMA  Venous Doppler was supposed to be 09/19/14. Do you know status? Thanks, [employer]

## 2014-09-25 NOTE — Telephone Encounter (Signed)
Patient has been notified of negative results and to stop Xarelto and resume aspirin. He states swelling has gone down.

## 2014-10-02 DIAGNOSIS — Z85820 Personal history of malignant melanoma of skin: Secondary | ICD-10-CM | POA: Diagnosis not present

## 2014-10-02 DIAGNOSIS — L570 Actinic keratosis: Secondary | ICD-10-CM | POA: Diagnosis not present

## 2014-10-02 DIAGNOSIS — D225 Melanocytic nevi of trunk: Secondary | ICD-10-CM | POA: Diagnosis not present

## 2014-10-02 DIAGNOSIS — X32XXXD Exposure to sunlight, subsequent encounter: Secondary | ICD-10-CM | POA: Diagnosis not present

## 2014-10-02 DIAGNOSIS — L821 Other seborrheic keratosis: Secondary | ICD-10-CM | POA: Diagnosis not present

## 2014-10-02 DIAGNOSIS — Z08 Encounter for follow-up examination after completed treatment for malignant neoplasm: Secondary | ICD-10-CM | POA: Diagnosis not present

## 2014-10-02 DIAGNOSIS — D485 Neoplasm of uncertain behavior of skin: Secondary | ICD-10-CM | POA: Diagnosis not present

## 2014-10-30 ENCOUNTER — Encounter: Payer: Self-pay | Admitting: Internal Medicine

## 2014-10-30 ENCOUNTER — Ambulatory Visit (INDEPENDENT_AMBULATORY_CARE_PROVIDER_SITE_OTHER): Payer: Medicare Other | Admitting: Internal Medicine

## 2014-10-30 VITALS — BP 138/80 | HR 64 | Temp 98.0°F | Resp 15 | Ht 68.0 in | Wt 196.0 lb

## 2014-10-30 DIAGNOSIS — E038 Other specified hypothyroidism: Secondary | ICD-10-CM | POA: Diagnosis not present

## 2014-10-30 DIAGNOSIS — E782 Mixed hyperlipidemia: Secondary | ICD-10-CM

## 2014-10-30 DIAGNOSIS — R739 Hyperglycemia, unspecified: Secondary | ICD-10-CM | POA: Diagnosis not present

## 2014-10-30 DIAGNOSIS — D126 Benign neoplasm of colon, unspecified: Secondary | ICD-10-CM

## 2014-10-30 DIAGNOSIS — I10 Essential (primary) hypertension: Secondary | ICD-10-CM

## 2014-10-30 DIAGNOSIS — I499 Cardiac arrhythmia, unspecified: Secondary | ICD-10-CM

## 2014-10-30 NOTE — Assessment & Plan Note (Signed)
A1c

## 2014-10-30 NOTE — Progress Notes (Signed)
Pre visit review using our clinic review tool, if applicable. No additional management support is needed unless otherwise documented below in the visit note. 

## 2014-10-30 NOTE — Assessment & Plan Note (Signed)
NMR Lipoprofile, LFT

## 2014-10-30 NOTE — Assessment & Plan Note (Signed)
TSH 

## 2014-10-30 NOTE — Patient Instructions (Addendum)
  Your next office appointment will be determined based upon review of your pending labs . Those instructions will be transmitted to you through My Chart . Critical values will be called. Minimal Blood Pressure Goal= AVERAGE < 140/90;  Ideal is an AVERAGE < 135/85. This AVERAGE should be calculated from @ least 5-7 BP readings taken @ different times of day on different days of week. You should not respond to isolated BP readings , but rather the AVERAGE for that week .Please bring your  blood pressure cuff to office visits to verify that it is reliable.It  can also be checked against the blood pressure device at the pharmacy. Finger or wrist cuffs are not dependable; an arm cuff is.  To prevent palpitations or premature beats, avoid stimulants such as decongestants, diet pills, nicotine, or caffeine (coffee, tea, cola, or chocolate) to excess. Weaning of coffee intake definitely suggested.

## 2014-10-30 NOTE — Progress Notes (Signed)
Subjective:    Patient ID: [name], male    DOB: [birth year], 79 y.o.   MRN: [id number]  HPI Medicare Wellness Visit: Psychosocial and medical history were reviewed as required by Medicare (history related to abuse, antisocial behavior , firearm risk). Social history: Caffeine: 3-4 cups/day Alcohol: 1-2/week Tobacco KGM:[facility] 1986 Exercise:see below Personal safety/fall risk:no Limitations of activities of daily living:no Seatbelt/ smoke alarm use:yes Healthcare Power of Attorney/Living Will status and End of Life process assessment : UTD Ophthalmologic exam status:UTD Hearing evaluation status:not UTD Orientation: Oriented X 3 Memory and recall: good Spelling or math testing: good Depression/anxiety assessment: no Foreign travel [place] Immunization status for influenza/pneumonia/ shingles /tetanus:UTD Transfusion history: no Preventive health care maintenance status: Colonoscopy as per protocol/standard care:UTD Dental care:every 6 mos Chart reviewed and updated. Active issues reviewed and addressed as documented below.  He has been compliant with his antihypertensive, statin, and thyroid medications without adverse effect.  He is on a heart healthy low-salt diet.No home BP monitor.  He works out at [employer] 2-3 times per week for at least 30 minutes employing weights and the stationary bike.  He has no associated cardiopulmonary symptoms. He has had some edema in the right lower extremity postoperatively.    Review of Systems  Significant headaches, epistaxis, chest pain, palpitations, exertional dyspnea, claudication, or paroxysmal nocturnal dyspnea absent. No GI symptoms , memory loss or myalgias. Some intermittent shoulder pain.  Pertinent negative or absent signs and symptoms are as follows: Constitutional: No significant change in weight; significant fatigue; sleep disorder; change in appetite. Eye: no blurred, double  ,loss of vision ENT/GI: no constipation; diarrhea;hoarseness;dysphagia Derm: no change in nails,hair,skin Neuro: no numbness or tingling; tremor Psych:no anxiety; depression; panic attacks Endo: no temperature intolerance to heat ,cold       Objective:   Physical Exam  Gen.: Adequately nourished in appearance. Alert, appropriate and cooperative throughout exam.  Appears younger than stated age  Head: Normocephalic without obvious abnormalities;  pattern alopecia  Eyes: No corneal or conjunctival inflammation noted. Pupils equal round reactive to light and accommodation. Extraocular motion intact. Significant ptosis, OS > OD( Ophth exam pending) Ears: External  ear exam reveals no significant lesions or deformities. Canals clear .L TM dull. Hearing is grossly normal bilaterally. Nose: External nasal exam reveals no deformity or inflammation. Nasal mucosa are pink and moist. No lesions or exudates noted.  Septal deviation to L Mouth: Oral mucosa and oropharynx reveal no lesions or exudates. Teeth in good repair. Neck: No deformities, masses, or tenderness noted. Range of motion & Thyroid normal Lungs: Normal respiratory effort; chest expands symmetrically. Lungs are clear to auscultation without rales, wheezes, or increased work of breathing. Heart: Slightly irregular rate and rhythm. Normal S1 and S2. No gallop, click, or rub. No murmur. Abdomen:Protuberant. Bowel sounds normal; abdomen soft and nontender. No masses, organomegaly or hernias noted. Genitalia: Deferred due to age.PSA was 0.92 in 10/2009 Musculoskeletal/extremities: No deformity or scoliosis noted of  the thoracic or lumbar spine.  No clubbing, cyanosis or significant extremity  deformity noted.  1+ edema RLE; trace LLE Range of motion decreased R knee.Fusiform changes R knee.Connye Burkitt & strength normal. Hand joints reveal mild  Fusiform  PIP changes.  Fingernail  health good. Able to lie down & sit up w/o help.  Negative SLR  bilaterally Vascular: Carotid, radial artery, dorsalis pedis and  posterior tibial pulses are full and equal. No bruits present. Neurologic: Alert and  oriented x3. Deep tendon reflexes symmetrical and normal.  Gait normal      Skin: Intact without suspicious lesions or rashes. Lymph: No cervical, axillary lymphadenopathy present. Psych: Mood and affect are normal. Normally interactive                                                                                     Assessment & Plan:  #1See Current Assessment & Plan in Problem List under specific DiagnosisThe labs will be reviewed and risks and options assessed. Written recommendations will be provided by mail or directly through My Chart.Further evaluation or change in medical therapy will be directed by those results. #2 irregular rhythm; EKG reveals ectopic atrial foci (variable P waves) & PVC .Atrial Fib NOT present.  Weaning of caffeine & other stimulants discussed.F/U with Dr Ron Parker.

## 2014-10-30 NOTE — Assessment & Plan Note (Signed)
BMET WNL in 12/15

## 2014-10-30 NOTE — Assessment & Plan Note (Signed)
CBC

## 2014-10-31 ENCOUNTER — Other Ambulatory Visit (INDEPENDENT_AMBULATORY_CARE_PROVIDER_SITE_OTHER): Payer: Medicare Other

## 2014-10-31 ENCOUNTER — Other Ambulatory Visit: Payer: Self-pay | Admitting: Internal Medicine

## 2014-10-31 DIAGNOSIS — E782 Mixed hyperlipidemia: Secondary | ICD-10-CM | POA: Diagnosis not present

## 2014-10-31 DIAGNOSIS — R739 Hyperglycemia, unspecified: Secondary | ICD-10-CM

## 2014-10-31 DIAGNOSIS — D126 Benign neoplasm of colon, unspecified: Secondary | ICD-10-CM

## 2014-10-31 DIAGNOSIS — E038 Other specified hypothyroidism: Secondary | ICD-10-CM | POA: Diagnosis not present

## 2014-10-31 LAB — CBC WITH DIFFERENTIAL/PLATELET
Basophils Absolute: 0.1 10*3/uL (ref 0.0–0.1)
Basophils Relative: 1.9 % (ref 0.0–3.0)
EOS PCT: 11.7 % — AB (ref 0.0–5.0)
Eosinophils Absolute: 0.9 10*3/uL — ABNORMAL HIGH (ref 0.0–0.7)
HEMATOCRIT: 45.7 % (ref 39.0–52.0)
HEMOGLOBIN: 15.3 g/dL (ref 13.0–17.0)
LYMPHS ABS: 1.9 10*3/uL (ref 0.7–4.0)
Lymphocytes Relative: 24.5 % (ref 12.0–46.0)
MCHC: 33.5 g/dL (ref 30.0–36.0)
MCV: 85.8 fl (ref 78.0–100.0)
MONOS PCT: 9.5 % (ref 3.0–12.0)
Monocytes Absolute: 0.7 10*3/uL (ref 0.1–1.0)
NEUTROS ABS: 4 10*3/uL (ref 1.4–7.7)
Neutrophils Relative %: 52.4 % (ref 43.0–77.0)
PLATELETS: 289 10*3/uL (ref 150.0–400.0)
RBC: 5.33 Mil/uL (ref 4.22–5.81)
RDW: 16.1 % — ABNORMAL HIGH (ref 11.5–15.5)
WBC: 7.7 10*3/uL (ref 4.0–10.5)

## 2014-10-31 LAB — HEPATIC FUNCTION PANEL
ALK PHOS: 62 U/L (ref 39–117)
ALT: 16 U/L (ref 0–53)
AST: 22 U/L (ref 0–37)
Albumin: 4 g/dL (ref 3.5–5.2)
BILIRUBIN DIRECT: 0.1 mg/dL (ref 0.0–0.3)
TOTAL PROTEIN: 7.3 g/dL (ref 6.0–8.3)
Total Bilirubin: 0.5 mg/dL (ref 0.2–1.2)

## 2014-10-31 LAB — HEMOGLOBIN A1C: HEMOGLOBIN A1C: 6.2 % (ref 4.6–6.5)

## 2014-10-31 LAB — TSH: TSH: 8.14 u[IU]/mL — AB (ref 0.35–4.50)

## 2014-11-02 LAB — NMR LIPOPROFILE WITH LIPIDS
Cholesterol, Total: 161 mg/dL (ref 100–199)
HDL Particle Number: 30.5 umol/L (ref >=30.5)
HDL SIZE: 9.1 nm — AB (ref >=9.2)
HDL-C: 49 mg/dL (ref >39)
LDL (calc): 96 mg/dL (ref 0–99)
LDL Particle Number: 1410 nmol/L — ABNORMAL HIGH (ref <1000)
LDL Size: 20.6 nm (ref >=20.8)
LP-IR Score: 30 (ref <=45)
Large HDL-P: 5 umol/L (ref >=4.8)
Large VLDL-P: <0.8 nmol/L (ref <=2.7)
Small LDL Particle Number: 629 nmol/L — ABNORMAL HIGH (ref <=527)
TRIGLYCERIDES: 81 mg/dL (ref 0–149)
VLDL SIZE: 40.8 nm (ref <=46.6)

## 2014-11-03 ENCOUNTER — Other Ambulatory Visit: Payer: Self-pay | Admitting: Internal Medicine

## 2014-11-03 DIAGNOSIS — E038 Other specified hypothyroidism: Secondary | ICD-10-CM

## 2014-11-06 ENCOUNTER — Telehealth: Payer: Self-pay | Admitting: Internal Medicine

## 2014-11-06 NOTE — Telephone Encounter (Signed)
emmi emailed °

## 2014-12-06 ENCOUNTER — Telehealth: Payer: Self-pay | Admitting: Internal Medicine

## 2014-12-06 ENCOUNTER — Telehealth: Payer: Self-pay

## 2014-12-06 NOTE — Telephone Encounter (Signed)
Left message for pt to call back if he still wants flu vaccine 

## 2014-12-06 NOTE — Telephone Encounter (Signed)
States patient did get a flu shot by Dr. Linna Darner during an [place].  Patient received phone call today about getting one.

## 2014-12-07 NOTE — Telephone Encounter (Signed)
Documented

## 2014-12-18 DIAGNOSIS — Z471 Aftercare following joint replacement surgery: Secondary | ICD-10-CM | POA: Diagnosis not present

## 2014-12-18 DIAGNOSIS — Z96651 Presence of right artificial knee joint: Secondary | ICD-10-CM | POA: Diagnosis not present

## 2014-12-28 ENCOUNTER — Encounter: Payer: Self-pay | Admitting: Cardiology

## 2014-12-28 ENCOUNTER — Ambulatory Visit (INDEPENDENT_AMBULATORY_CARE_PROVIDER_SITE_OTHER): Payer: Medicare Other | Admitting: Cardiology

## 2014-12-28 VITALS — BP 118/72 | HR 60 | Ht 68.0 in | Wt 194.0 lb

## 2014-12-28 DIAGNOSIS — I2510 Atherosclerotic heart disease of native coronary artery without angina pectoris: Secondary | ICD-10-CM

## 2014-12-28 DIAGNOSIS — I10 Essential (primary) hypertension: Secondary | ICD-10-CM

## 2014-12-28 DIAGNOSIS — I779 Disorder of arteries and arterioles, unspecified: Secondary | ICD-10-CM

## 2014-12-28 DIAGNOSIS — I739 Peripheral vascular disease, unspecified: Secondary | ICD-10-CM

## 2014-12-28 NOTE — Assessment & Plan Note (Signed)
He has carotid disease. He will be scheduled have a follow-up Doppler soon. After his last Doppler, he had a thyroid ultrasound. This showed inhomogeneous thyroid , but no  Distinct lesion.

## 2014-12-28 NOTE — Patient Instructions (Signed)
Your physician recommends that you continue on your current medications as directed. Please refer to the Current Medication list given to you today.  Your physician wants you to follow-up in: 1 year with one of our Cardiac Team Members. You will receive a reminder letter in the mail two months in advance. If you don't receive a letter, please call our office to schedule the follow-up appointment.   Your physician has requested that you have a carotid duplex. This test is an ultrasound of the carotid arteries in your neck. It looks at blood flow through these arteries that supply the brain with blood. Allow one hour for this exam. There are no restrictions or special instructions.

## 2014-12-28 NOTE — Assessment & Plan Note (Signed)
Blood pressures controlled. No change in therapy. 

## 2014-12-28 NOTE — Progress Notes (Signed)
Cardiology Office Note   Date:  12/28/2014   ID:  [name], DOB [birth year], MRN [id number]  PCP:  Unice Cobble, MD  Cardiologist:  Dola Argyle, MD   Chief Complaint  Patient presents with  . Appointment     follow-up coronary artery disease      History of Present Illness: [name] is a 79 y.o. male who presents    Today to follow-up coronary artery disease. I saw him last April, 2015. He had a nuclear scan in 2011. There was no significant ischemia. He is not having any symptoms. His last carotid Doppler was April, 2015. He has moderate disease and he will have a follow-up study this year. He is not having any chest pain or shortness of breath.    Past Medical History  Diagnosis Date  . Hypertension   . Hypothyroidism   . Hyperlipidemia   . BPH (benign prostatic hypertrophy)   . History of colonic polyps   . Skin cancer (melanoma)     PMH of   . Coronary artery disease     DES Circumflex or CVA 2006  /  clear, February, 2011, EF 70%, no ischemia or  . Venous insufficiency   . Carotid artery disease     Doppler, December 08, 2011, 00 73% R. ICA, 71-06% LICA, followup 1 year  . Tremor     Fine tremor right upper extremity  . Ejection fraction     EF 60%, echo, 2009, mildly calcified aortic leaflets  . Multiple thyroid nodules     Avascular echogenic areas noted in the right thyroid at the time of carotid Doppler.   . Arthritis   . History of nonmelanoma skin cancer     Past Surgical History  Procedure Laterality Date  . Total hip arthroplasty  2007  . Tonsillectomy    . Toe surgery  1997  . Colonoscopy w/ polypectomy  2009  . Coronary angioplasty with stent placement  2006    x 2 stents  . Knee surgery  1970's  . Total knee arthroplasty Right 06/25/2014    Procedure: RIGHT TOTAL KNEE ARTHROPLASTY;  Surgeon: Gearlean Alf, MD;  Location: [facility];  Service: Orthopedics;  Laterality: Right;    Patient Active Problem List   Diagnosis Date Noted  .  OA (osteoarthritis) of knee 06/25/2014  . Unspecified sleep apnea 06/18/2014  . Multiple thyroid nodules   . Tremor   . Carotid artery disease   . Erectile dysfunction 05/31/2012  . Coronary artery disease   . Venous insufficiency   . Hyperglycemia 11/09/2008  . MELANOMA, HX OF 11/09/2008  . COLONIC POLYPS, HYPERPLASTIC 05/10/2008  . Hypothyroidism 08/10/2007  . HYPERLIPIDEMIA 08/10/2007  . Essential hypertension 08/10/2007  . DEGENERATIVE JOINT DISEASE 08/08/2007  . MURMUR 08/08/2007  . BENIGN PROSTATIC HYPERTROPHY, HX OF 08/05/2007      Current Outpatient Prescriptions  Medication Sig Dispense Refill  . aspirin 325 MG tablet Take 325 mg by mouth daily.    Marland Kitchen atorvastatin (LIPITOR) 40 MG tablet Take 20 mg by mouth at bedtime. Patient taking (20 mg) one half tablet by mouth daily at bedtime    . benazepril (LOTENSIN) 20 MG tablet Take 30 mg by mouth every morning. Patient taking (30 mg) one and a half tablets by mouth daily    . diltiazem (DILACOR XR) 180 MG 24 hr capsule Take 180 mg by mouth every morning.    . furosemide (LASIX) 40 MG tablet Take 1  tablet (40 mg total) by mouth daily. 30 tablet 3  . levothyroxine (SYNTHROID, LEVOTHROID) 150 MCG tablet Take 150 mcg by mouth daily before breakfast.    . metoprolol tartrate (LOPRESSOR) 25 MG tablet Take 12.5 mg by mouth 2 (two) times daily.    . nitroGLYCERIN (NITROSTAT) 0.4 MG SL tablet Place 1 tablet (0.4 mg total) under the tongue every 5 (five) minutes as needed. 25 tablet 11   No current facility-administered medications for this visit.    Allergies:   Amlodipine besylate and Codeine    Social History:  The patient  reports that he quit smoking about 30 years ago. His smoking use included Cigarettes. He has never used smokeless tobacco. He reports that he drinks alcohol. He reports that he does not use illicit drugs.   Family History:  The patient's family history includes Colon cancer (age of onset: 61) in his father;  Coronary artery disease in his mother; Diabetes in his maternal grandfather and sister; Heart attack (age of onset: 60) in his sister; Heart attack (age of onset: 65) in his maternal grandfather; Hypertension in his father; Pancreatitis in his father. There is no history of Thyroid disease or Stroke.    ROS:  Please see the history of present illness.      Patient denies fever, chills, headache, sweats, rash, change in vision, change in hearing, chest pain, cough, nausea or vomiting, urinary symptoms. All of the systems are reviewed and are negative.   PHYSICAL EXAM: VS:  BP 118/72 mmHg  Pulse 60  Ht 5\' 8"  (1.727 m)  Wt 194 lb (87.998 kg)  BMI 29.50 kg/m2 ,  patient is stable.  He is here with his wife. Head is atraumatic. Sclera and conjunctiva are normal. There is no jugular venous distention. Lungs are clear. Respiratory effort is not labored. Cardiac exam reveals S1 and S2. Abdomen is soft. There is no peripheral edema. There are no musculoskeletal deformities. There are no skin rashes. Neurologic is grossly intact.  EKG:    I reviewed his EKG from February, 2016. There was no significant change.   Recent Labs: 09/18/2014: BUN 17; Creatinine 1.1; Potassium 4.8; Sodium 140 10/31/2014: ALT 16; Hemoglobin 15.3; Platelets 289.0; TSH 8.14*    Lipid Panel    Component Value Date/Time   CHOL 161 10/31/2014 0824   CHOL 133 07/28/2013 0916   TRIG 81 10/31/2014 0824   TRIG 67.0 07/28/2013 0916   TRIG 86 07/23/2006 1012   HDL 49 10/31/2014 0824   HDL 41.90 07/28/2013 0916   CHOLHDL 3 07/28/2013 0916   CHOLHDL 3.8 CALC 07/23/2006 1012   VLDL 13.4 07/28/2013 0916   LDLCALC 96 10/31/2014 0824   LDLCALC 78 07/28/2013 0916      Wt Readings from Last 3 Encounters:  12/28/14 194 lb (87.998 kg)  10/30/14 196 lb (88.905 kg)  09/18/14 193 lb 4 oz (87.658 kg)      Current medicines are reviewed   The patient and his wife understand his medications.     ASSESSMENT AND PLAN:

## 2014-12-28 NOTE — Assessment & Plan Note (Signed)
Coronary disease is stable. He had drug-eluting stent to the circumflex in 2006. Nuclear study was done February, 2011. There was no schemia.  He does not need a follow-up study now.

## 2014-12-31 ENCOUNTER — Other Ambulatory Visit (HOSPITAL_COMMUNITY): Payer: Self-pay | Admitting: Cardiology

## 2014-12-31 DIAGNOSIS — I6523 Occlusion and stenosis of bilateral carotid arteries: Secondary | ICD-10-CM

## 2015-01-04 DIAGNOSIS — H31003 Unspecified chorioretinal scars, bilateral: Secondary | ICD-10-CM | POA: Diagnosis not present

## 2015-01-04 DIAGNOSIS — H02831 Dermatochalasis of right upper eyelid: Secondary | ICD-10-CM | POA: Diagnosis not present

## 2015-01-04 DIAGNOSIS — H2513 Age-related nuclear cataract, bilateral: Secondary | ICD-10-CM | POA: Diagnosis not present

## 2015-01-04 DIAGNOSIS — H02834 Dermatochalasis of left upper eyelid: Secondary | ICD-10-CM | POA: Diagnosis not present

## 2015-01-08 ENCOUNTER — Ambulatory Visit (HOSPITAL_COMMUNITY): Payer: Medicare Other | Attending: Cardiology

## 2015-01-08 DIAGNOSIS — I6523 Occlusion and stenosis of bilateral carotid arteries: Secondary | ICD-10-CM | POA: Diagnosis not present

## 2015-01-08 NOTE — Progress Notes (Signed)
Carotid duplex performed 

## 2015-01-09 ENCOUNTER — Telehealth: Payer: Self-pay | Admitting: Cardiology

## 2015-01-09 ENCOUNTER — Other Ambulatory Visit: Payer: Self-pay

## 2015-01-09 DIAGNOSIS — R609 Edema, unspecified: Secondary | ICD-10-CM

## 2015-01-09 MED ORDER — ATORVASTATIN CALCIUM 40 MG PO TABS: 20.0000 mg | ORAL_TABLET | Freq: Every day | ORAL | Status: DC

## 2015-01-09 MED ORDER — NITROGLYCERIN 0.4 MG SL SUBL: 0.4000 mg | SUBLINGUAL_TABLET | SUBLINGUAL | Status: DC | PRN

## 2015-01-09 MED ORDER — FUROSEMIDE 40 MG PO TABS: 40.0000 mg | ORAL_TABLET | Freq: Every day | ORAL | Status: DC

## 2015-01-09 MED ORDER — METOPROLOL TARTRATE 25 MG PO TABS: 12.5000 mg | ORAL_TABLET | Freq: Two times a day (BID) | ORAL | Status: DC

## 2015-01-09 MED ORDER — DILTIAZEM HCL ER 180 MG PO CP24: 180.0000 mg | ORAL_CAPSULE | Freq: Every morning | ORAL | Status: DC

## 2015-01-09 MED ORDER — ASPIRIN 325 MG PO TABS: 325.0000 mg | ORAL_TABLET | Freq: Every day | ORAL | Status: DC

## 2015-01-09 NOTE — Telephone Encounter (Addendum)
The pt states that he needs all of his cardiac medications refilled. Please send refills to [facility] on [address].  Please let the pt know that we have checked the front desk and his Metoprolol was not turned in.

## 2015-01-09 NOTE — Telephone Encounter (Signed)
New Message       Pt calling stating that he is checking on his refills, pt refused to tell me which medications and states that the nurse knows what medications he is calling about and he refused to speak to the refill department and only wants to hear from Dr. Ron Parker nurse. Please call back and advise.

## 2015-02-07 ENCOUNTER — Other Ambulatory Visit: Payer: Self-pay | Admitting: Cardiology

## 2015-02-19 ENCOUNTER — Encounter: Payer: Self-pay | Admitting: Internal Medicine

## 2015-02-19 ENCOUNTER — Ambulatory Visit (INDEPENDENT_AMBULATORY_CARE_PROVIDER_SITE_OTHER): Payer: Medicare Other | Admitting: Internal Medicine

## 2015-02-19 ENCOUNTER — Other Ambulatory Visit (INDEPENDENT_AMBULATORY_CARE_PROVIDER_SITE_OTHER): Payer: Medicare Other

## 2015-02-19 ENCOUNTER — Ambulatory Visit (INDEPENDENT_AMBULATORY_CARE_PROVIDER_SITE_OTHER)
Admission: RE | Admit: 2015-02-19 | Discharge: 2015-02-19 | Disposition: A | Discharge status: Home / Self Care | Payer: Medicare Other | Source: Ambulatory Visit | Attending: Internal Medicine | Admitting: Internal Medicine

## 2015-02-19 VITALS — BP 118/78 | HR 68 | Temp 98.1°F | Wt 198.2 lb

## 2015-02-19 DIAGNOSIS — Z23 Encounter for immunization: Secondary | ICD-10-CM | POA: Diagnosis not present

## 2015-02-19 DIAGNOSIS — E038 Other specified hypothyroidism: Secondary | ICD-10-CM | POA: Diagnosis not present

## 2015-02-19 DIAGNOSIS — M5136 Other intervertebral disc degeneration, lumbar region: Secondary | ICD-10-CM | POA: Diagnosis not present

## 2015-02-19 DIAGNOSIS — E039 Hypothyroidism, unspecified: Secondary | ICD-10-CM

## 2015-02-19 DIAGNOSIS — R609 Edema, unspecified: Secondary | ICD-10-CM | POA: Diagnosis not present

## 2015-02-19 DIAGNOSIS — M47816 Spondylosis without myelopathy or radiculopathy, lumbar region: Secondary | ICD-10-CM | POA: Diagnosis not present

## 2015-02-19 DIAGNOSIS — M5416 Radiculopathy, lumbar region: Secondary | ICD-10-CM | POA: Diagnosis not present

## 2015-02-19 LAB — TSH: TSH: 4.15 u[IU]/mL (ref 0.35–4.50)

## 2015-02-19 NOTE — Progress Notes (Signed)
Pre visit review using our clinic review tool, if applicable. No additional management support is needed unless otherwise documented below in the visit note. 

## 2015-02-19 NOTE — Patient Instructions (Signed)
  Your next office appointment will be determined based upon review of your pending TSH  and  Spine xrays  Those written interpretation of the lab results and instructions will be transmitted to you by My Chart Critical results will be called.  Followup as needed for any active or acute issue. Please report any significant change in your symptoms.  Wear over the calf support hose if you are standing for prolonged periods of  time or working on your feet for prolonged periods of time.

## 2015-02-19 NOTE — Addendum Note (Signed)
Addended by: Ander Slade on: 02/19/2015 12:48 PM   Modules accepted: Orders

## 2015-02-19 NOTE — Progress Notes (Signed)
   Subjective:    Patient ID: [name], male    DOB: 1936/07/23, 79 y.o.   MRN: [id number]  HPI He continues to have the asymmetric swelling in the lower extremities greater on the right than the left. This was evaluated in late December 2015. There was no deep venous thrombosis on Doppler. This has not responded to 40 mg of Lasix daily.It decreases overnight.  In February his TSH was 8.14. He was instructed to increase the thyroid by one half pill Tuesday and Thursday. This was not done. It was verified that he received these records as he read the results.  He also has intermittent pain in the low back with radiation to the upper inner thighs bilaterally.Initially he said this started in late February of this year;but he then stated it started in 05/2014 after he fell doing yardwork.. The orthopedist's  diagnosis was "hamstring".   Review of Systems   Chest pain, palpitations, tachycardia, exertional dyspnea, paroxysmal nocturnal dyspnea,or claudication are absent.  Fever, chills, sweats, or unexplained weight loss not present. No significant headaches. Gait imbalance denied. There is no numbness, tingling, or weakness in extremities.   No loss of control of bladder or bowels.     Objective:   Physical Exam  Pertinent or positive findings include: No NVD @ 10 degrees. Heart sounds are distant.  He has trace edema left lower extremity. He has 1+ pitting edema in the lower extremity on the left.  Heel and toe walking were normal.  General appearance is one of good health and nourishment w/o distress. Eyes: No conjunctival inflammation or scleral icterus is present. Oral exam: Dental hygiene is good; lips and gums are healthy appearing.There is no oropharyngeal erythema or exudate noted.  Heart:  Normal rate and regular rhythm. S1 and S2 normal without gallop, murmur, click, rub or other extra sounds   Lungs:Chest clear to auscultation; no wheezes, rhonchi,rales ,or rubs  present.No increased work of breathing.  Abdomen: bowel sounds normal, soft and non-tender without masses, organomegaly or hernias noted.  No guarding or rebound . No HJR Musculoskeletal: Able to lie flat and sit up without help. Negative straight leg raising bilaterally. Gait normal Skin:Warm & dry.  Intact without suspicious lesions or rashes ; no jaundice or tenting Lymphatic: No lymphadenopathy is noted about the head, neck, axilla              Assessment & Plan:  #1 venous insufficiency right lower extremity. Compression hose will be ordered. If he fails to respond; referral will be made to the vascular clinic   #2 bilateral lumbar radiculopathy suggestive of L2 or L3 with neurogenic claudication. Films of the lumbar sacral spine will be performed as these have not been completed by his orthopedist. #3 Hypothyroidism, uncorrected. TSH will be rechecked prior to change in dosage.

## 2015-03-07 ENCOUNTER — Other Ambulatory Visit: Payer: Self-pay | Admitting: Cardiology

## 2015-03-07 ENCOUNTER — Other Ambulatory Visit: Payer: Self-pay | Admitting: Internal Medicine

## 2015-03-07 NOTE — Telephone Encounter (Signed)
Refer to pcp. Thanks.

## 2015-03-07 NOTE — Telephone Encounter (Signed)
Patients tsh is monitored by pcp. Ok to refill or defer to Dr Linna Darner? Please advise. Thanks, MI

## 2015-05-06 DIAGNOSIS — H40013 Open angle with borderline findings, low risk, bilateral: Secondary | ICD-10-CM | POA: Diagnosis not present

## 2015-05-10 DIAGNOSIS — H4011X4 Primary open-angle glaucoma, indeterminate stage: Secondary | ICD-10-CM | POA: Diagnosis not present

## 2015-05-17 DIAGNOSIS — Z96641 Presence of right artificial hip joint: Secondary | ICD-10-CM | POA: Diagnosis not present

## 2015-05-17 DIAGNOSIS — Z471 Aftercare following joint replacement surgery: Secondary | ICD-10-CM | POA: Diagnosis not present

## 2015-05-17 DIAGNOSIS — Z96651 Presence of right artificial knee joint: Secondary | ICD-10-CM | POA: Diagnosis not present

## 2015-05-17 DIAGNOSIS — Z96643 Presence of artificial hip joint, bilateral: Secondary | ICD-10-CM | POA: Diagnosis not present

## 2015-05-31 DIAGNOSIS — M79604 Pain in right leg: Secondary | ICD-10-CM | POA: Diagnosis not present

## 2015-06-11 DIAGNOSIS — M79604 Pain in right leg: Secondary | ICD-10-CM | POA: Diagnosis not present

## 2015-06-18 DIAGNOSIS — M79604 Pain in right leg: Secondary | ICD-10-CM | POA: Diagnosis not present

## 2015-06-21 DIAGNOSIS — H4011X4 Primary open-angle glaucoma, indeterminate stage: Secondary | ICD-10-CM | POA: Diagnosis not present

## 2015-08-04 ENCOUNTER — Other Ambulatory Visit: Payer: Self-pay | Admitting: Cardiology

## 2015-09-19 ENCOUNTER — Telehealth: Payer: Self-pay | Admitting: Internal Medicine

## 2015-09-19 ENCOUNTER — Ambulatory Visit (INDEPENDENT_AMBULATORY_CARE_PROVIDER_SITE_OTHER): Payer: Medicare Other

## 2015-09-19 DIAGNOSIS — Z23 Encounter for immunization: Secondary | ICD-10-CM | POA: Diagnosis not present

## 2015-09-19 NOTE — Telephone Encounter (Signed)
Patient states you took care of mother in law before she passed away and went over and beyond and called to check on family when mother passed. Patient states you told him when Dr. Levell July you would take him and his spouse on as new patient. Please advise if i can schedule.

## 2015-09-19 NOTE — Telephone Encounter (Signed)
That  is okay, please schedule a 30-min  appointment to get established

## 2015-09-19 NOTE — Telephone Encounter (Signed)
Patient scheduled for 12/31/2015 at 3pm

## 2015-09-20 ENCOUNTER — Telehealth: Payer: Self-pay | Admitting: Internal Medicine

## 2015-09-20 MED ORDER — LEVOTHYROXINE SODIUM 150 MCG PO TABS: 150.0000 ug | ORAL_TABLET | Freq: Every day | ORAL | Status: DC

## 2015-09-20 NOTE — Telephone Encounter (Signed)
Refill x 1 month Needs TSH pre next refill as TSH had varied significantly

## 2015-09-20 NOTE — Telephone Encounter (Signed)
Pt informed of below.  

## 2015-09-20 NOTE — Telephone Encounter (Signed)
Pt request to speak to the assistant, pt want refill for levothyroxine (SYNTHROID, LEVOTHROID) 150 MCG to be send to [facility]. Please help, he also wants to let Dr. Linna Darner know he appreciated everything Dr. Linna Darner did for him.

## 2015-12-30 ENCOUNTER — Telehealth: Payer: Self-pay

## 2015-12-31 ENCOUNTER — Encounter: Payer: Self-pay | Admitting: Internal Medicine

## 2015-12-31 ENCOUNTER — Ambulatory Visit (INDEPENDENT_AMBULATORY_CARE_PROVIDER_SITE_OTHER): Payer: Medicare Other | Admitting: Internal Medicine

## 2015-12-31 VITALS — BP 124/72 | HR 69 | Temp 98.2°F | Ht 68.0 in | Wt 196.2 lb

## 2015-12-31 DIAGNOSIS — I10 Essential (primary) hypertension: Secondary | ICD-10-CM

## 2015-12-31 DIAGNOSIS — E039 Hypothyroidism, unspecified: Secondary | ICD-10-CM | POA: Diagnosis not present

## 2015-12-31 DIAGNOSIS — I6523 Occlusion and stenosis of bilateral carotid arteries: Secondary | ICD-10-CM

## 2015-12-31 DIAGNOSIS — E785 Hyperlipidemia, unspecified: Secondary | ICD-10-CM

## 2015-12-31 DIAGNOSIS — Z09 Encounter for follow-up examination after completed treatment for conditions other than malignant neoplasm: Secondary | ICD-10-CM | POA: Insufficient documentation

## 2015-12-31 DIAGNOSIS — E119 Type 2 diabetes mellitus without complications: Secondary | ICD-10-CM | POA: Diagnosis not present

## 2015-12-31 DIAGNOSIS — Z7689 Persons encountering health services in other specified circumstances: Secondary | ICD-10-CM

## 2015-12-31 DIAGNOSIS — R601 Generalized edema: Secondary | ICD-10-CM

## 2015-12-31 MED ORDER — DILTIAZEM HCL ER 180 MG PO CP24: 180.0000 mg | ORAL_CAPSULE | Freq: Every morning | ORAL | Status: DC

## 2015-12-31 MED ORDER — ATORVASTATIN CALCIUM 40 MG PO TABS: 20.0000 mg | ORAL_TABLET | Freq: Every day | ORAL | Status: DC

## 2015-12-31 MED ORDER — FUROSEMIDE 40 MG PO TABS: 40.0000 mg | ORAL_TABLET | Freq: Every day | ORAL | Status: DC

## 2015-12-31 MED ORDER — BENAZEPRIL HCL 20 MG PO TABS: 30.0000 mg | ORAL_TABLET | Freq: Every morning | ORAL | Status: DC

## 2015-12-31 MED ORDER — METOPROLOL TARTRATE 25 MG PO TABS: 12.5000 mg | ORAL_TABLET | Freq: Two times a day (BID) | ORAL | Status: DC

## 2015-12-31 NOTE — Assessment & Plan Note (Signed)
Prediabetes: Check A1c. Diet , exercise discussed HTN: Seems well-controlled, continue Lotensin, dilacor, Lopressor and Lasix. Check a CMP. He is taking both BBs and  CCBs , heart rate is in the 60s. No change Hyperlipidemia: Continue Lipitor, check labs  Hypothyroidism: Continue Synthroid, check a TSH CAD: asx, continue present care including aspirin. Has a f/u w/ new  Cardiologist in May. He seems to be stable Carotid dz:  Schedule ultrasound RTC 4 months

## 2015-12-31 NOTE — Progress Notes (Signed)
Subjective:    Patient ID: [name], male    DOB: [birth year], 80 y.o.   MRN: [id number]  DOS:  12/31/2015 Type of visit - description : new pt to me, to get established , chart reviewed  Interval history: CAD: Good compliance of medication, exercise  limited by DJD. Last note from cardiology reviewed HTN: Good compliance of medication, no recent ambulatory BPs. BP today is very good. High cholesterol: Tolerates Lipitor well.      Review of Systems  Denies chest pain or difficulty breathing No nausea, vomiting, diarrhea Having hamstring problem, follow-up by Dr. Maureen Ralphs   Past Medical History  Diagnosis Date  . Hypertension   . Hypothyroidism   . Hyperlipidemia   . BPH (benign prostatic hypertrophy)   . History of colonic polyps   . Skin cancer (melanoma) ([place])     PMH of   . Coronary artery disease     DES Circumflex or CVA 2006  /  clear, February, 2011, EF 70%, no ischemia or  . Venous insufficiency   . Carotid artery disease ([place])     Doppler, December 08, 2011, 00 [id number] R. ICA, 123456 LICA, followup 1 year  . Tremor     Fine tremor right upper extremity  . Ejection fraction     EF 60%, echo, 2009, mildly calcified aortic leaflets  . Multiple thyroid nodules     Avascular echogenic areas noted in the right thyroid at the time of carotid Doppler.   . Arthritis   . History of nonmelanoma skin cancer     Past Surgical History  Procedure Laterality Date  . Total hip arthroplasty  2007  . Tonsillectomy    . Toe surgery  1997  . Colonoscopy w/ polypectomy  2009  . Coronary angioplasty with stent placement  2006    x 2 stents  . Knee surgery  1970's  . Total knee arthroplasty Right 06/25/2014    Procedure: RIGHT TOTAL KNEE ARTHROPLASTY;  Surgeon: Gearlean Alf, MD;  Location: [facility];  Service: Orthopedics;  Laterality: Right;    Social History   Social History  . Marital Status: Married    Spouse Name: N/A  . Number of Children: 2  . Years of Education:  N/A   Occupational History  . retired, Nurse, learning disability    Social History Main Topics  . Smoking status: Former Smoker    Types: Cigarettes    Quit date: 09/21/1984  . Smokeless tobacco: Never Used     Comment: smoked 1958-1986, up to 1 ppd  . Alcohol Use: Yes     Comment: socially on occasion  . Drug Use: No  . Sexual Activity: Not on file   Other Topics Concern  . Not on file   Social History Narrative   2 daughters, one is a Therapist, sports @ [employer]        Medication List       This list is accurate as of: 12/31/15  6:26 PM.  Always use your most recent med list.               aspirin 325 MG tablet  Take 1 tablet (325 mg total) by mouth daily.     atorvastatin 40 MG tablet  Commonly known as:  LIPITOR  Take 0.5 tablets (20 mg total) by mouth at bedtime.     benazepril 20 MG tablet  Commonly known as:  LOTENSIN  Take 1.5 tablets (30 mg total) by mouth  every morning.     diltiazem 180 MG 24 hr capsule  Commonly known as:  DILACOR XR  Take 1 capsule (180 mg total) by mouth every morning.     furosemide 40 MG tablet  Commonly known as:  LASIX  Take 1 tablet (40 mg total) by mouth daily.     latanoprost 0.005 % ophthalmic solution  Commonly known as:  XALATAN  Place 1 drop into both eyes at bedtime.     levothyroxine 150 MCG tablet  Commonly known as:  SYNTHROID, LEVOTHROID  Take 1 tablet (150 mcg total) by mouth daily. --- Please establish with new PCP and labs for further refills     metoprolol tartrate 25 MG tablet  Commonly known as:  LOPRESSOR  Take 0.5 tablets (12.5 mg total) by mouth 2 (two) times daily.     nitroGLYCERIN 0.4 MG SL tablet  Commonly known as:  NITROSTAT  Place 1 tablet (0.4 mg total) under the tongue every 5 (five) minutes as needed.           Objective:   Physical Exam BP 124/72 mmHg  Pulse 69  Temp(Src) 98.2 F (36.8 C) (Oral)  Ht 5\' 8"  (1.727 m)  Wt 196 lb 4 oz (89.018 kg)  BMI 29.85 kg/m2  SpO2 96%    General:   Well developed, well nourished . NAD.  Neck:   carotid pulse palpable  HEENT:  Normocephalic . Face symmetric, atraumatic Lungs:  CTA B Normal respiratory effort, no intercostal retractions, no accessory muscle use. Heart: RRR,  no murmur.  No pretibial edema bilaterally  Abdomen:  Not distended, soft, non-tender. No rebound or rigidity.  No bruit Skin: Exposed areas without rash. Not pale. Not jaundice Neurologic:  alert & oriented X3.  Speech normal, gait appropriate for age and unassisted. Somewhat limited by DJD Strength symmetric and appropriate for age.  Psych: Cognition and judgment appear intact.  Cooperative with normal attention span and concentration.  Behavior appropriate. No anxious or depressed appearing.       Assessment & Plan:   Assessment Diabetes, A1c 6.2 2013 HTN Hyperlipidemia Hypothyroidism  Thyroid US 2014-- no nodules, inhomogeneous  CV: --CAD-- low risk nuclear scan 2011 --Carotid artery disease [place] 12/2014:   Stable, 1-39% RICA stenosis. Stable, 123456 LICA stenosis. R leg edema, [place] (-)   DVT 08-2014 DJD: Dr Maureen Ralphs Venous insufficiency Tremor glaucoma H/o skin cancer , denies Melanoma  Plan: Prediabetes: Check A1c. Diet , exercise discussed HTN: Seems well-controlled, continue Lotensin, dilacor, Lopressor and Lasix. Check a CMP. He is taking both BBs and  CCBs , heart rate is in the 60s. No change Hyperlipidemia: Continue Lipitor, check labs  Hypothyroidism: Continue Synthroid, check a TSH CAD: asx, continue present care including aspirin. Has a f/u w/ new  Cardiologist in May. He seems to be stable Carotid dz:  Schedule ultrasound RTC 4 months

## 2015-12-31 NOTE — Progress Notes (Signed)
Pre visit review using our clinic review tool, if applicable. No additional management support is needed unless otherwise documented below in the visit note. 

## 2015-12-31 NOTE — Patient Instructions (Signed)
GO TO THE LAB :      Get the blood work     GO TO THE FRONT DESK Schedule your next appointment for a  yearly checkup in 4 months, no fasting

## 2015-12-31 NOTE — Telephone Encounter (Signed)
Pre visit call completed 

## 2016-01-02 ENCOUNTER — Other Ambulatory Visit (INDEPENDENT_AMBULATORY_CARE_PROVIDER_SITE_OTHER): Payer: Medicare Other

## 2016-01-02 DIAGNOSIS — E039 Hypothyroidism, unspecified: Secondary | ICD-10-CM

## 2016-01-02 DIAGNOSIS — I10 Essential (primary) hypertension: Secondary | ICD-10-CM | POA: Diagnosis not present

## 2016-01-02 LAB — COMPREHENSIVE METABOLIC PANEL
ALT: 16 U/L (ref 0–53)
AST: 23 U/L (ref 0–37)
Albumin: 4.1 g/dL (ref 3.5–5.2)
Alkaline Phosphatase: 56 U/L (ref 39–117)
BUN: 21 mg/dL (ref 6–23)
CALCIUM: 9.4 mg/dL (ref 8.4–10.5)
CHLORIDE: 105 meq/L (ref 96–112)
CO2: 27 meq/L (ref 19–32)
CREATININE: 1.06 mg/dL (ref 0.40–1.50)
GFR: 71.85 mL/min (ref >60.00)
Glucose, Bld: 74 mg/dL (ref 70–99)
Potassium: 3.5 mEq/L (ref 3.5–5.1)
SODIUM: 141 meq/L (ref 135–145)
TOTAL PROTEIN: 6.9 g/dL (ref 6.0–8.3)
Total Bilirubin: 0.8 mg/dL (ref 0.2–1.2)

## 2016-01-02 LAB — LIPID PANEL
CHOL/HDL RATIO: 3
Cholesterol: 150 mg/dL (ref 0–200)
HDL: 43.7 mg/dL (ref >39.00)
LDL CALC: 84 mg/dL (ref 0–99)
NonHDL: 105.93
TRIGLYCERIDES: 110 mg/dL (ref 0.0–149.0)
VLDL: 22 mg/dL (ref 0.0–40.0)

## 2016-01-02 LAB — HEMOGLOBIN A1C: Hgb A1c MFr Bld: 6.1 % (ref 4.6–6.5)

## 2016-01-02 LAB — TSH: TSH: 8.3 u[IU]/mL — ABNORMAL HIGH (ref 0.35–4.50)

## 2016-01-07 ENCOUNTER — Telehealth: Payer: Self-pay | Admitting: *Deleted

## 2016-01-07 NOTE — Telephone Encounter (Signed)
PA initiated, awaiting determination. JG//CMA  

## 2016-01-08 MED ORDER — LEVOTHYROXINE SODIUM 200 MCG PO TABS: 200.0000 ug | ORAL_TABLET | Freq: Every day | ORAL | Status: DC

## 2016-01-10 ENCOUNTER — Ambulatory Visit (HOSPITAL_COMMUNITY)
Admission: RE | Admit: 2016-01-10 | Discharge: 2016-01-10 | Disposition: A | Discharge status: Home / Self Care | Payer: Medicare Other | Source: Ambulatory Visit | Attending: Cardiovascular Disease | Admitting: Cardiovascular Disease

## 2016-01-10 DIAGNOSIS — R938 Abnormal findings on diagnostic imaging of other specified body structures: Secondary | ICD-10-CM | POA: Insufficient documentation

## 2016-01-10 DIAGNOSIS — E785 Hyperlipidemia, unspecified: Secondary | ICD-10-CM | POA: Diagnosis not present

## 2016-01-10 DIAGNOSIS — I10 Essential (primary) hypertension: Secondary | ICD-10-CM | POA: Insufficient documentation

## 2016-01-10 DIAGNOSIS — I6523 Occlusion and stenosis of bilateral carotid arteries: Secondary | ICD-10-CM | POA: Diagnosis not present

## 2016-01-13 ENCOUNTER — Telehealth: Payer: Self-pay | Admitting: Internal Medicine

## 2016-01-13 MED ORDER — LEVOTHYROXINE SODIUM 50 MCG PO TABS: 50.0000 ug | ORAL_TABLET | Freq: Every day | ORAL | Status: DC

## 2016-01-13 NOTE — Telephone Encounter (Signed)
Dr. Larose Kells, okay to send levothyroxine 50 mcg, #60, for pt to take in addition to his remaining 150 mcg tabs (increase to 200 mcg total dose per 01/02/16 labs)?

## 2016-01-13 NOTE — Telephone Encounter (Signed)
Okay levothyroxine  50 mcg 1 tablet daily #60, no RF  to be taken in addition to the 150 mcg

## 2016-01-13 NOTE — Telephone Encounter (Signed)
Medication filled to pharmacy as requested.   

## 2016-01-13 NOTE — Telephone Encounter (Signed)
Pt in office w/ his wife for her appt. Notified pt of below.

## 2016-01-13 NOTE — Telephone Encounter (Signed)
Can be reached: [phone] Pharmacy: [facility] [id number] - [place], [place]  Reason for call: Pt states that pharmacy was faxing [place] Friday or Saturday to request 78mcg levothyroxine. Pt has 57 doses of 150 mcg tablets on hand and is wanting to have the 43mcg tablets to finish off what he has left before having the new RX filled.   Pt wife has appt today and he is coming with her.   Pt also asking about drinking coffee when taking this med. He read an article about it.

## 2016-01-14 NOTE — Progress Notes (Signed)
Patient ID: [name], male   DOB: [birth year], 80 y.o.   MRN: [id number]    Cardiology Office Note   Date:  01/21/2016   ID:  [name], DOB 11-01-1935, MRN [id number]  PCP:  [name], MD  Cardiologist:  Jenkins Rouge, MD   Chief Complaint  Patient presents with  . Coronary Artery Disease    no sx      History of Present Illness: [name] is a 80 y.o. male previously seen by Dr Ron Parker  New to me  History of CAD . DES to circumflex 2006 He had a nuclear scan in 2011 that was non ischemic. Marland Kitchen He is not having any symptoms. His last carotid Doppler was .01/10/16   With plaque no stenosis no f/u for 2 years needed He is not having any chest pain or shortness of breath.  CRF;s HTN and elevated lipids  Has had right hamstring issues for close to 2 years after right knee replacement Has also had both hips replaced Seeing Dr Ellene Route soon for ? Back issue Use to fly planes Compliant with meds     Past Medical History  Diagnosis Date  . Hypertension   . Hypothyroidism   . Hyperlipidemia   . BPH (benign prostatic hypertrophy)   . History of colonic polyps   . Skin cancer (melanoma) ([place])     PMH of   . Coronary artery disease     DES Circumflex or CVA 2006  /  clear, February, 2011, EF 70%, no ischemia or  . Venous insufficiency   . Carotid artery disease ([place])     Doppler, December 08, 2011, 00 [id number] R. ICA, 123456 LICA, followup 1 year  . Tremor     Fine tremor right upper extremity  . Ejection fraction     EF 60%, echo, 2009, mildly calcified aortic leaflets  . Multiple thyroid nodules     Avascular echogenic areas noted in the right thyroid at the time of carotid Doppler.   . Arthritis   . History of nonmelanoma skin cancer     Past Surgical History  Procedure Laterality Date  . Total hip arthroplasty  2007  . Tonsillectomy    . Toe surgery  1997  . Colonoscopy w/ polypectomy  2009  . Coronary angioplasty with stent placement  2006    x 2 stents  . Knee surgery   1970's  . Total knee arthroplasty Right 06/25/2014    Procedure: RIGHT TOTAL KNEE ARTHROPLASTY;  Surgeon: Gearlean Alf, MD;  Location: [facility];  Service: Orthopedics;  Laterality: Right;    Patient Active Problem List   Diagnosis Date Noted  . PCP NOTES>>>>>>>>>>>>>>>>>>>>>>>>>>> 12/31/2015  . Edema 02/19/2015  . OA (osteoarthritis) of knee 06/25/2014  . Unspecified sleep apnea 06/18/2014  . Multiple thyroid nodules   . Tremor   . Carotid artery disease ([place])   . Erectile dysfunction 05/31/2012  . Coronary artery disease   . Venous insufficiency   . Hyperglycemia 11/09/2008  . MELANOMA, HX OF 11/09/2008  . COLONIC POLYPS, HYPERPLASTIC 05/10/2008  . Hypothyroidism 08/10/2007  . HYPERLIPIDEMIA 08/10/2007  . Essential hypertension 08/10/2007  . DEGENERATIVE JOINT DISEASE 08/08/2007  . MURMUR 08/08/2007  . BENIGN PROSTATIC HYPERTROPHY, HX OF 08/05/2007      Current Outpatient Prescriptions  Medication Sig Dispense Refill  . aspirin 325 MG tablet Take 1 tablet (325 mg total) by mouth daily. 90 tablet 3  . atorvastatin (LIPITOR) 40 MG tablet Take 0.5  tablets (20 mg total) by mouth at bedtime. 45 tablet 1  . baclofen (LIORESAL) 10 MG tablet Take 10 mg by mouth 3 (three) times daily.  0  . benazepril (LOTENSIN) 20 MG tablet Take 1.5 tablets (30 mg total) by mouth every morning. 135 tablet 1  . diclofenac (VOLTAREN) 50 MG EC tablet Take 50 mg by mouth 3 (three) times daily.    Marland Kitchen diltiazem (DILACOR XR) 180 MG 24 hr capsule Take 1 capsule (180 mg total) by mouth every morning. 90 capsule 3  . furosemide (LASIX) 40 MG tablet Take 1 tablet (40 mg total) by mouth daily. 90 tablet 1  . latanoprost (XALATAN) 0.005 % ophthalmic solution Place 1 drop into both eyes at bedtime.    Marland Kitchen levothyroxine (SYNTHROID, LEVOTHROID) 200 MCG tablet Take 200 mcg by mouth daily before breakfast.    . metoprolol tartrate (LOPRESSOR) 25 MG tablet Take 0.5 tablets (12.5 mg total) by mouth 2 (two) times daily. 90  tablet 1  . nitroGLYCERIN (NITROSTAT) 0.4 MG SL tablet Place 1 tablet (0.4 mg total) under the tongue every 5 (five) minutes as needed. 25 tablet 11   No current facility-administered medications for this visit.    Allergies:   Amlodipine besylate and Codeine    Social History:  The patient  reports that he quit smoking about 31 years ago. His smoking use included Cigarettes. He has never used smokeless tobacco. He reports that he drinks alcohol. He reports that he does not use illicit drugs.   Family History:  The patient's family history includes Colon cancer (age of onset: 21) in his father; Coronary artery disease in his mother; Diabetes in his maternal grandfather and sister; Heart attack (age of onset: 5) in his sister; Heart attack (age of onset: 75) in his maternal grandfather; Hypertension in his father; Pancreatitis in his father. There is no history of Thyroid disease, Stroke, or Prostate cancer.    ROS:  Please see the history of present illness.      Patient denies fever, chills, headache, sweats, rash, change in vision, change in hearing, chest pain, cough, nausea or vomiting, urinary symptoms. All of the systems are reviewed and are negative.   PHYSICAL EXAM: VS:  BP 130/78 mmHg  Pulse 56  Ht 5\' 8"  (1.727 m)  Wt 90.266 kg (199 lb)  BMI 30.26 kg/m2  SpO2 93% ,  patient is stable.  He is here with his wife. Head is atraumatic. Sclera and conjunctiva are normal. There is no jugular venous distention. Lungs are clear. Respiratory effort is not labored. Cardiac exam reveals S1 and S2. Abdomen is soft. There is no peripheral edema. There are no musculoskeletal deformities. There are no skin rashes. Neurologic is grossly intact.  EKG:    10/2014 SR rate 69 PVC LAD   01/21/16  SR rate 82  PAC;s LAFB    Recent Labs: 01/02/2016: ALT 16; BUN 21; Creatinine, Ser 1.06; Potassium 3.5; Sodium 141; TSH 8.30*    Lipid Panel    Component Value Date/Time   CHOL 150 01/02/2016 1125    CHOL 161 10/31/2014 0824   TRIG 110.0 01/02/2016 1125   TRIG 81 10/31/2014 0824   TRIG 86 07/23/2006 1012   HDL 43.70 01/02/2016 1125   HDL 49 10/31/2014 0824   CHOLHDL 3 01/02/2016 1125   CHOLHDL 3.8 CALC 07/23/2006 1012   VLDL 22.0 01/02/2016 1125   LDLCALC 84 01/02/2016 1125   LDLCALC 96 10/31/2014 0824      Wt  Readings from Last 3 Encounters:  01/21/16 90.266 kg (199 lb)  12/31/15 89.018 kg (196 lb 4 oz)  02/19/15 89.926 kg (198 lb 4 oz)      Current medicines are reviewed   The patient and his wife understand his medications.     ASSESSMENT AND PLAN: CAD:  DES to Circ 2006 non ischemic myovue 2011 will need lexiscan myovue if he needs back surgery  HTN:  Well controlled.  Continue current medications and low sodium Dash type diet.   Chol:   Lab Results  Component Value Date   LDLCALC 84 01/02/2016   Thyroid:  Dose recently increased to 200 ug f/u TSH in 3 months  Lab Results  Component Value Date   TSH 8.30* 01/02/2016   PAC;s :   Asymptomatic continue beta blocker Back:  Likely has lumbar spine issue causing RLE pain. Will need MRI and f/u Dr Ivar Bury

## 2016-01-20 ENCOUNTER — Encounter: Payer: Self-pay | Admitting: Cardiovascular Disease

## 2016-01-21 ENCOUNTER — Encounter: Payer: Self-pay | Admitting: Cardiovascular Disease

## 2016-01-21 ENCOUNTER — Ambulatory Visit (INDEPENDENT_AMBULATORY_CARE_PROVIDER_SITE_OTHER): Payer: Medicare Other | Admitting: Cardiovascular Disease

## 2016-01-21 VITALS — BP 130/78 | HR 56 | Ht 68.0 in | Wt 199.0 lb

## 2016-01-21 DIAGNOSIS — I779 Disorder of arteries and arterioles, unspecified: Secondary | ICD-10-CM

## 2016-01-21 DIAGNOSIS — I739 Peripheral vascular disease, unspecified: Principal | ICD-10-CM

## 2016-01-21 NOTE — Patient Instructions (Signed)

## 2016-01-27 ENCOUNTER — Other Ambulatory Visit: Payer: Self-pay | Admitting: Cardiology

## 2016-02-11 DIAGNOSIS — M545 Low back pain: Secondary | ICD-10-CM | POA: Diagnosis not present

## 2016-02-11 DIAGNOSIS — M5416 Radiculopathy, lumbar region: Secondary | ICD-10-CM | POA: Diagnosis not present

## 2016-02-19 DIAGNOSIS — Z135 Encounter for screening for eye and ear disorders: Secondary | ICD-10-CM | POA: Diagnosis not present

## 2016-02-19 DIAGNOSIS — M545 Low back pain: Secondary | ICD-10-CM | POA: Diagnosis not present

## 2016-02-19 DIAGNOSIS — M4806 Spinal stenosis, lumbar region: Secondary | ICD-10-CM | POA: Diagnosis not present

## 2016-02-24 ENCOUNTER — Telehealth: Payer: Self-pay | Admitting: Internal Medicine

## 2016-02-24 NOTE — Telephone Encounter (Signed)
Reason for Call     PA: diltiazem ER 180mg  caps         Call Documentation      Chaya Jan, CMA at 01/07/2016 11:56 AM     Status: Signed       Expand All Collapse All   PA initiated, awaiting determination. JG//CMA

## 2016-02-24 NOTE — Telephone Encounter (Signed)
°  Relationship to patient:  [facility]  Can be reached: [phone]   Reason for call: Need approval for diltiazem (DILACOR XR) 180 MG 24 hr capsule [id number]

## 2016-02-25 NOTE — Telephone Encounter (Signed)
Caller name: Deidre Ala with [facility] PA dept Can be reached: [phone] x [phone]  Reason for call: called regarding concerning appeal and needs clinical information. He sent fax to [phone] on 02/24/16 2:53pm. He states failure to submit info by 02/26/16 the claim will be denied again.

## 2016-02-25 NOTE — Telephone Encounter (Signed)
Angie, have you seen fax for this?

## 2016-02-26 ENCOUNTER — Telehealth: Payer: Self-pay | Admitting: Internal Medicine

## 2016-02-26 NOTE — Telephone Encounter (Signed)
Call Documentation      Damita Dunnings, [place] at 02/26/2016 1:04 PM     Status: Signed       Expand All Collapse All   Received fax confirmation on 02/26/2016 at 1304.             Damita Dunnings, CMA at 02/26/2016 12:58 PM     Status: Signed       Expand All Collapse All   Received Standard Appeal for Diltiazem, form completed and faxed back to [facility] at [phone]. Form sent for scanning.             Damita Dunnings, CMA at 02/25/2016 3:50 PM     Status: Signed       Expand All Collapse All   Angie, have you seen fax for this?            Margot Ables at 02/25/2016 3:30 PM     Status: Signed       Expand All Collapse All   Caller name: Deidre Ala with [facility] PA dept Can be reached: [phone] x [phone]  Reason for call: called regarding concerning appeal and needs clinical information. He sent fax to [phone] on 02/24/16 2:53pm. He states failure to submit info by 02/26/16 the claim will be denied again.             Damita Dunnings, CMA at 02/24/2016 3:54 PM     Status: Signed       Expand All Collapse All   Reason for Call     PA: diltiazem ER 180mg  caps         Call Documentation      Chaya Jan, CMA at 01/07/2016 11:56 AM     Status: Signed       Expand All Collapse All  PA initiated, awaiting determination. JG//CMA                   Quintin Alto at 02/24/2016 3:50 PM     Status: Signed       Expand All Collapse All    Relationship to patient: [facility]  Can be reached: [phone]   Reason for call: Need approval for diltiazem (DILACOR XR) 180 MG 24 hr capsule [id number]

## 2016-02-26 NOTE — Telephone Encounter (Signed)
Caller name: Rosalita Levan Relation to pt: [facility] [employer] Call back number: [phone] ext [phone] [facility]acy[facility]   Reason for call: Wanting to know about pt's rx diltiazem (DILACOR XR) 180 MG 24 hr capsule information. Please advise.

## 2016-02-26 NOTE — Telephone Encounter (Signed)
Received [place] Appeal for Diltiazem, form completed and faxed back to [facility] at [phone]. Form sent for scanning.

## 2016-02-26 NOTE — Telephone Encounter (Signed)
Please see telephone note 02/24/2016.

## 2016-02-26 NOTE — Telephone Encounter (Signed)
Received fax confirmation on 02/26/2016 at 1304.

## 2016-02-28 ENCOUNTER — Other Ambulatory Visit: Payer: Self-pay | Admitting: Internal Medicine

## 2016-02-28 NOTE — Telephone Encounter (Signed)
Routing to patient's new pcp to handle---former dr hopper patient

## 2016-03-03 ENCOUNTER — Telehealth: Payer: Self-pay | Admitting: Internal Medicine

## 2016-03-03 NOTE — Telephone Encounter (Signed)
°  Relationship to patient: Self Can be reached: [phone]   Reason for call: Patient states he received a letter in the mail that is addressed to him but is for Dr. Larose Kells. States he opened the letter because it had his name on the outside but it is written to the dr. Ruthy Dick adv

## 2016-03-03 NOTE — Telephone Encounter (Signed)
Called [facility], spoke w/ Threasa Beards, informed her I was calling regarding PA approval for Diltiazem. Informed that per their system Diltiazem didn't need PA. PA approval sent for scanning.

## 2016-03-03 NOTE — Telephone Encounter (Signed)
Spoke w/ Pt, he received approval letter for his Diltiazem 180mg  24 hr capsule and wanted to make sure we received same information. Informed him I haven't seen approval yet. Given authorization from Pt: verified x2: [id number] for 01/07/2016 to 09/20/2016. Informed Pt I will call his pharmacy and inform of PA approval. Pt verbalized understanding.

## 2016-03-06 ENCOUNTER — Other Ambulatory Visit: Payer: Self-pay | Admitting: Internal Medicine

## 2016-03-17 DIAGNOSIS — M5416 Radiculopathy, lumbar region: Secondary | ICD-10-CM | POA: Diagnosis not present

## 2016-03-17 DIAGNOSIS — M5136 Other intervertebral disc degeneration, lumbar region: Secondary | ICD-10-CM | POA: Diagnosis not present

## 2016-03-17 DIAGNOSIS — M4806 Spinal stenosis, lumbar region: Secondary | ICD-10-CM | POA: Diagnosis not present

## 2016-03-17 DIAGNOSIS — M545 Low back pain: Secondary | ICD-10-CM | POA: Diagnosis not present

## 2016-03-30 ENCOUNTER — Telehealth: Payer: Self-pay | Admitting: Cardiovascular Disease

## 2016-03-30 ENCOUNTER — Telehealth (HOSPITAL_COMMUNITY): Payer: Self-pay | Admitting: *Deleted

## 2016-03-30 DIAGNOSIS — I25810 Atherosclerosis of coronary artery bypass graft(s) without angina pectoris: Secondary | ICD-10-CM

## 2016-03-30 DIAGNOSIS — Z01818 Encounter for other preprocedural examination: Secondary | ICD-10-CM

## 2016-03-30 NOTE — Telephone Encounter (Signed)
Patient given detailed instructions per Myocardial Perfusion Study Information Sheet for the test on 03/31/16 Patient notified to arrive 15 minutes early and that it is imperative to arrive on time for appointment to keep from having the test rescheduled.  If you need to cancel or reschedule your appointment, please call the office within 24 hours of your appointment. Failure to do so may result in a cancellation of your appointment, and a $50 no show fee. Patient verbalized understanding. Hubbard Robinson, rn

## 2016-03-30 NOTE — Telephone Encounter (Signed)
Calling stating he is going to be have back surgery within the next couple of months and wants to go ahead and schedule his stress test.  Per Dr. Kyla Balzarine last OV note he needs to have a [place].  Will schedule. Not a diabetic so does not need to stop any medications.

## 2016-03-30 NOTE — Telephone Encounter (Signed)
New Message  Pt call requesting to speak with RN about scheduling a stress test. Pt states he is to have back surgery in the nest couple of weeks. Pt states he wants to have the stress test complete before the back surgery and would like to speak with RN about questions he has about the stress test. Please call back to discuss

## 2016-03-31 ENCOUNTER — Ambulatory Visit (HOSPITAL_COMMUNITY): Payer: Medicare Other | Attending: Cardiology

## 2016-03-31 DIAGNOSIS — Z01818 Encounter for other preprocedural examination: Secondary | ICD-10-CM | POA: Insufficient documentation

## 2016-03-31 DIAGNOSIS — I10 Essential (primary) hypertension: Secondary | ICD-10-CM | POA: Diagnosis not present

## 2016-03-31 DIAGNOSIS — I25810 Atherosclerosis of coronary artery bypass graft(s) without angina pectoris: Secondary | ICD-10-CM | POA: Insufficient documentation

## 2016-03-31 LAB — MYOCARDIAL PERFUSION IMAGING
CHL CUP RESTING HR STRESS: 54 {beats}/min
CSEPPHR: 73 {beats}/min
LV dias vol: 84 mL (ref 62–150)
LVSYSVOL: 32 mL
NUC STRESS TID: 1.03
RATE: 0.37
SDS: 0
SRS: 6
SSS: 6

## 2016-03-31 MED ORDER — REGADENOSON 0.4 MG/5ML IV SOLN
0.4000 mg | Freq: Once | INTRAVENOUS | Status: AC
  Administered 2016-03-31: 0.4 mg via INTRAVENOUS

## 2016-03-31 MED ORDER — TECHNETIUM TC 99M TETROFOSMIN IV KIT
10.6000 | PACK | Freq: Once | INTRAVENOUS | Status: AC | PRN
  Administered 2016-03-31: 11 via INTRAVENOUS
  Filled 2016-03-31: qty 11

## 2016-03-31 MED ORDER — TECHNETIUM TC 99M TETROFOSMIN IV KIT
32.2000 | PACK | Freq: Once | INTRAVENOUS | Status: AC | PRN
  Administered 2016-03-31: 32.2 via INTRAVENOUS
  Filled 2016-03-31: qty 32

## 2016-04-02 ENCOUNTER — Telehealth: Payer: Self-pay | Admitting: Internal Medicine

## 2016-04-02 NOTE — Telephone Encounter (Signed)
Spoke w/ Pt, informed him he is due for repeat TSH. He informed me he will go to [place] lab location tomorrow morning for labs. Informed him that is fine and we would call w/ results.

## 2016-04-02 NOTE — Telephone Encounter (Signed)
Due for a TSH, DX hypothyroidism. Please arrange

## 2016-04-03 ENCOUNTER — Other Ambulatory Visit (INDEPENDENT_AMBULATORY_CARE_PROVIDER_SITE_OTHER): Payer: Medicare Other

## 2016-04-03 DIAGNOSIS — E039 Hypothyroidism, unspecified: Secondary | ICD-10-CM | POA: Diagnosis not present

## 2016-04-03 LAB — TSH: TSH: 0.41 u[IU]/mL (ref 0.35–4.50)

## 2016-04-13 DIAGNOSIS — H401114 Primary open-angle glaucoma, right eye, indeterminate stage: Secondary | ICD-10-CM | POA: Diagnosis not present

## 2016-04-13 DIAGNOSIS — H524 Presbyopia: Secondary | ICD-10-CM | POA: Diagnosis not present

## 2016-04-13 DIAGNOSIS — H401122 Primary open-angle glaucoma, left eye, moderate stage: Secondary | ICD-10-CM | POA: Diagnosis not present

## 2016-04-13 DIAGNOSIS — H2513 Age-related nuclear cataract, bilateral: Secondary | ICD-10-CM | POA: Diagnosis not present

## 2016-04-16 ENCOUNTER — Other Ambulatory Visit: Payer: Self-pay | Admitting: Neurosurgery

## 2016-04-23 NOTE — Telephone Encounter (Signed)
This encounter was created in error - please disregard.

## 2016-04-24 ENCOUNTER — Telehealth: Payer: Self-pay

## 2016-04-24 NOTE — Telephone Encounter (Signed)
Patient is scheduled for a Lumbar Laminectomy and Foraminotomy L2-L3, L3-L4, L4-L5, Left L5-S1 disectomy; to be performed by Dr. Frederich Cha, on 07-13-2016. Patient needs clearance for the above stated surgery/procedure. Please fax to [phone].

## 2016-04-24 NOTE — Telephone Encounter (Signed)
Ok to have back surgery myovue non ischemic

## 2016-04-24 NOTE — Telephone Encounter (Signed)
Will fax to [facility].

## 2016-05-04 ENCOUNTER — Encounter: Payer: Self-pay | Admitting: Internal Medicine

## 2016-05-04 ENCOUNTER — Ambulatory Visit (INDEPENDENT_AMBULATORY_CARE_PROVIDER_SITE_OTHER): Payer: Medicare Other | Admitting: Internal Medicine

## 2016-05-04 VITALS — BP 132/78 | HR 56 | Temp 97.9°F | Resp 14 | Ht 68.0 in | Wt 194.4 lb

## 2016-05-04 DIAGNOSIS — E039 Hypothyroidism, unspecified: Secondary | ICD-10-CM | POA: Diagnosis not present

## 2016-05-04 DIAGNOSIS — R601 Generalized edema: Secondary | ICD-10-CM

## 2016-05-04 DIAGNOSIS — I10 Essential (primary) hypertension: Secondary | ICD-10-CM | POA: Diagnosis not present

## 2016-05-04 DIAGNOSIS — E785 Hyperlipidemia, unspecified: Secondary | ICD-10-CM

## 2016-05-04 DIAGNOSIS — R739 Hyperglycemia, unspecified: Secondary | ICD-10-CM

## 2016-05-04 DIAGNOSIS — I2510 Atherosclerotic heart disease of native coronary artery without angina pectoris: Secondary | ICD-10-CM

## 2016-05-04 DIAGNOSIS — Z0000 Encounter for general adult medical examination without abnormal findings: Secondary | ICD-10-CM | POA: Diagnosis not present

## 2016-05-04 LAB — CBC WITH DIFFERENTIAL/PLATELET
Basophils Absolute: 0.1 10*3/uL (ref 0.0–0.1)
Basophils Relative: 1.3 % (ref 0.0–3.0)
EOS ABS: 0.4 10*3/uL (ref 0.0–0.7)
Eosinophils Relative: 6.5 % — ABNORMAL HIGH (ref 0.0–5.0)
HCT: 45.9 % (ref 39.0–52.0)
HEMOGLOBIN: 15.4 g/dL (ref 13.0–17.0)
Lymphocytes Relative: 22.8 % (ref 12.0–46.0)
Lymphs Abs: 1.5 10*3/uL (ref 0.7–4.0)
MCHC: 33.7 g/dL (ref 30.0–36.0)
MCV: 90.5 fl (ref 78.0–100.0)
MONO ABS: 0.9 10*3/uL (ref 0.1–1.0)
Monocytes Relative: 13.2 % — ABNORMAL HIGH (ref 3.0–12.0)
NEUTROS PCT: 56.2 % (ref 43.0–77.0)
Neutro Abs: 3.8 10*3/uL (ref 1.4–7.7)
Platelets: 255 10*3/uL (ref 150.0–400.0)
RBC: 5.07 Mil/uL (ref 4.22–5.81)
RDW: 14.4 % (ref 11.5–15.5)
WBC: 6.7 10*3/uL (ref 4.0–10.5)

## 2016-05-04 LAB — HEMOGLOBIN A1C: HEMOGLOBIN A1C: 5.8 % (ref 4.6–6.5)

## 2016-05-04 LAB — TSH: TSH: 0.37 u[IU]/mL (ref 0.35–4.50)

## 2016-05-04 MED ORDER — ATORVASTATIN CALCIUM 40 MG PO TABS: 20.0000 mg | ORAL_TABLET | Freq: Every day | ORAL | 3 refills | Status: DC

## 2016-05-04 MED ORDER — METOPROLOL TARTRATE 25 MG PO TABS: 12.5000 mg | ORAL_TABLET | Freq: Two times a day (BID) | ORAL | 3 refills | Status: DC

## 2016-05-04 MED ORDER — FUROSEMIDE 40 MG PO TABS: 40.0000 mg | ORAL_TABLET | Freq: Every day | ORAL | 3 refills | Status: DC

## 2016-05-04 MED ORDER — BENAZEPRIL HCL 20 MG PO TABS: 30.0000 mg | ORAL_TABLET | Freq: Every morning | ORAL | 3 refills | Status: DC

## 2016-05-04 MED ORDER — DILTIAZEM HCL ER 180 MG PO CP24: 180.0000 mg | ORAL_CAPSULE | Freq: Every morning | ORAL | 3 refills | Status: DC

## 2016-05-04 NOTE — Assessment & Plan Note (Addendum)
Td 2014, pnm 2014, Prevnar 2016 ; zostavax 2015  Last cscope 2014 + polyps , no further cscope Prostate cancer screening discuss, no recent PSA but all previous readings were normal. For now declined further screening which goes along with the guidelines Diet and exercise discussed Labs: CBC A1c and TSH

## 2016-05-04 NOTE — Progress Notes (Signed)
Subjective:    Patient ID: [name], male    DOB: [birth year], 80 y.o.   MRN: [id number]  DOS:  05/04/2016 Type of visit - description :  Here for Medicare AWV:  1. Risk factors based on Past M, S, F history: reviewed 2. Physical Activities: limited by back pain  3. Depression/mood: neg screening  4. Hearing:  No problems noted or reported  5. ADL's: independent, drives  6. Fall Risk: no recent falls, prevention discussed , see AVS 7. home Safety: does feel safe at home  8. Height, weight, & visual acuity: see VS, sees eye doctor regulalrly, Dr Satira Sark  9. Counseling: provided 10. Labs ordered based on risk factors: if needed  11. Referral Coordination: if needed 12. Care Plan, see assessment and plan , written personalized plan provided , see AVS 13. Cognitive Assessment: motor skills and cognition appropriate for age 1. Care team updated  15. End-of-life care, has a HC POA  In addition, today we discussed the following: Prediabetes: Unable to exercise much HTN: Good med compliance, no recent ambulatory BPs Hyperlipidemia: On Lipitor, good compliance without apparent side effects Hypothyroidism: On Synthroid. MSK: Having back pain, taking a muscle relaxant and diclofenac on average twice a day with some relief. To have back surgery soon.   Review of Systems Constitutional: No fever. No chills. No unexplained wt changes. No unusual sweats  HEENT: No dental problems, no ear discharge, no facial swelling, no voice changes. No eye discharge, no eye  redness , no  intolerance to light   Respiratory: No wheezing , no  difficulty breathing. No cough , no mucus production  Cardiovascular: No CP, no leg swelling , no  Palpitations  GI: no nausea, no vomiting, no diarrhea , no  abdominal pain.  No blood in the stools. No dysphagia, no odynophagia    Endocrine: No polyphagia, no polyuria , no polydipsia  GU: No dysuria, gross hematuria, difficulty urinating. No urinary  urgency, no frequency.  Musculoskeletal: other than back pain, no  unusual aches    Skin: No change in the color of the skin, palor , no  Rash  Allergic, immunologic: No environmental allergies , no  food allergies  Neurological: No dizziness no  syncope. No headaches. No diplopia, no slurred, no slurred speech, no motor deficits, no facial  Numbness  Hematological: No enlarged lymph nodes, no easy bruising , no unusual bleedings  Psychiatry: No suicidal ideas, no hallucinations, no beavior problems, no confusion.  No unusual/severe anxiety, no depression   Past Medical History:  Diagnosis Date  . Arthritis   . BPH (benign prostatic hypertrophy)   . Carotid artery disease ([place])    Doppler, December 08, 2011, 00 [id number] R. ICA, 123456 LICA, followup 1 year  . Coronary artery disease    DES Circumflex or CVA 2006  /  clear, February, 2011, EF 70%, no ischemia or  . Ejection fraction    EF 60%, echo, 2009, mildly calcified aortic leaflets  . History of colonic polyps   . Hyperlipidemia   . Hypertension   . Hypothyroidism   . Lumbar radiculopathy   . Multiple thyroid nodules    Avascular echogenic areas noted in the right thyroid at the time of carotid Doppler.   . Skin cancer (melanoma) ([place])    PMH of   . Spinal stenosis, lumbar   . Tremor    Fine tremor right upper extremity  . Venous insufficiency     Past Surgical History:  Procedure Laterality Date  . CORONARY ANGIOPLASTY WITH STENT PLACEMENT  2006   x 2 stents  . KNEE SURGERY  1970's  . TOE SURGERY  1997  . TONSILLECTOMY    . TOTAL HIP ARTHROPLASTY  2007  . TOTAL KNEE ARTHROPLASTY Right 06/25/2014   Procedure: RIGHT TOTAL KNEE ARTHROPLASTY;  Surgeon: Gearlean Alf, MD;  Location: [facility];  Service: Orthopedics;  Laterality: Right;    Social History   Social History  . Marital status: Married    Spouse name: N/A  . Number of children: 2  . Years of education: N/A   Occupational History  . retired, Recruitment consultant    Social History Main Topics  . Smoking status: Former Smoker    Types: Cigarettes    Quit date: 09/21/1984  . Smokeless tobacco: Never Used     Comment: smoked 1958-1986, up to 1 ppd  . Alcohol use Yes     Comment: socially on occasion  . Drug use: No  . Sexual activity: Not on file   Other Topics Concern  . Not on file   Social History Narrative   2 daughters, one is a Therapist, sports @ [employer]     Family History  Problem Relation Age of Onset  . Coronary artery disease Mother     carotid endarterectomy bilaterally  . Colon cancer Father 8  . Hypertension Father   . Pancreatitis Father   . Diabetes Sister   . Diabetes Maternal Grandfather   . Heart attack Maternal Grandfather 70  . Heart attack Sister 63  . Thyroid disease Neg Hx   . Stroke Neg Hx   . Prostate cancer Neg Hx        Medication List       Accurate as of 05/04/16 11:59 PM. Always use your most recent med list.          amoxicillin 500 MG tablet Commonly known as:  AMOXIL Take 2,000 mg by mouth every hour as needed (For dental appts).   aspirin 325 MG tablet Take 1 tablet (325 mg total) by mouth daily.   atorvastatin 40 MG tablet Commonly known as:  LIPITOR Take 0.5 tablets (20 mg total) by mouth at bedtime.   baclofen 10 MG tablet Commonly known as:  LIORESAL Take 10 mg by mouth 3 (three) times daily.   benazepril 20 MG tablet Commonly known as:  LOTENSIN Take 1.5 tablets (30 mg total) by mouth every morning.   diclofenac 50 MG EC tablet Commonly known as:  VOLTAREN Take 50 mg by mouth 3 (three) times daily.   diltiazem 180 MG 24 hr capsule Commonly known as:  DILACOR XR Take 1 capsule (180 mg total) by mouth every morning.   furosemide 40 MG tablet Commonly known as:  LASIX Take 1 tablet (40 mg total) by mouth daily.   levothyroxine 200 MCG tablet Commonly known as:  SYNTHROID, LEVOTHROID Take 200 mcg by mouth daily before breakfast.   LUMIGAN 0.01 %  Soln Generic drug:  bimatoprost Place 1 drop into both eyes at bedtime.   metoprolol tartrate 25 MG tablet Commonly known as:  LOPRESSOR Take 0.5 tablets (12.5 mg total) by mouth 2 (two) times daily.   nitroGLYCERIN 0.4 MG SL tablet Commonly known as:  NITROSTAT Place 1 tablet (0.4 mg total) under the tongue every 5 (five) minutes as needed.          Objective:   Physical Exam BP 132/78 (BP Location: Left Arm,  Patient Position: Sitting, Cuff Size: Normal)   Pulse (!) 56   Temp 97.9 F (36.6 C) (Oral)   Resp 14   Ht 5\' 8"  (1.727 m)   Wt 194 lb 6 oz (88.2 kg)   SpO2 95%   BMI 29.55 kg/m   General:   Well developed, well nourished . NAD.  Neck: No  thyromegaly  HEENT:  Normocephalic . Face symmetric, atraumatic Lungs:  CTA B Normal respiratory effort, no intercostal retractions, no accessory muscle use. Heart: RRR,  no murmur.  No pretibial edema bilaterally  Abdomen:  Not distended, soft, non-tender. No rebound or rigidity.   Skin: Exposed areas without rash. Not pale. Not jaundice Neurologic:  alert & oriented X3.  Speech normal, gait appropriate for age and unassisted Strength symmetric and appropriate for age.  Psych: Cognition and judgment appear intact.  Cooperative with normal attention span and concentration.  Behavior appropriate. No anxious or depressed appearing.     Assessment & Plan:    Assessment  (Transfer from  Dr. Linna Darner [phone]) Diabetes, A1c 6.2 2013 HTN Hyperlipidemia Hypothyroidism  Thyroid US 2014-- no nodules, inhomogeneous  CV: --CAD: stents 2006;  low risk nuclear scan 2011 --Carotid artery disease [place] 12/2014:   Stable, 1-39% RICA stenosis. Stable, 123456 LICA stenosis. R leg edema, [place] (-)   DVT 08-2014 DJD: Dr Maureen Ralphs Venous insufficiency, chronic R>L edema Tremor glaucoma H/o skin cancer , denies Melanoma  PLAN: Prediabetes: Check A1c, feet exam negative today HTN: Continue Lotensin, potassium, Lasix and metoprolol. Last  BMP satisfactory. Check a CBC Hyperlipidemia: Last FLP satisfactory, continue Lipitor Hypothyroidism: Good med compliance, check a TSH CAD: to have surgery on his back, I see that cardiology has already cleared him. Back pain: To have surgery, recommend to continue muscle relaxant, minimize the use of diclofenac, try Tylenol instead. See instructions. RTC 6-8 months

## 2016-05-04 NOTE — Progress Notes (Signed)
Pre visit review using our clinic review tool, if applicable. No additional management support is needed unless otherwise documented below in the visit note. 

## 2016-05-04 NOTE — Patient Instructions (Addendum)
Get your blood work before you leave   Next visit in 6 months  For pain: Try to minimize the use of diclofenac, it may cause gastritis, ulcer or kidney problems. Try not to go over 1 tab a day. Okay to use Tylenol 500 mg 2 tablets every 8 hours as needed    Fall Prevention and Home Safety Falls cause injuries and can affect all age groups. It is possible to use preventive measures to significantly decrease the likelihood of falls. There are many simple measures which can make your home safer and prevent falls. OUTDOORS  Repair cracks and edges of walkways and driveways.  Remove high doorway thresholds.  Trim shrubbery on the main path into your home.  Have good outside lighting.  Clear walkways of tools, rocks, debris, and clutter.  Check that handrails are not broken and are securely fastened. Both sides of steps should have handrails.  Have leaves, snow, and ice cleared regularly.  Use sand or salt on walkways during winter months.  In the garage, clean up grease or oil spills. BATHROOM  Install night lights.  Install grab bars by the toilet and in the tub and shower.  Use non-skid mats or decals in the tub or shower.  Place a plastic non-slip stool in the shower to sit on, if needed.  Keep floors dry and clean up all water on the floor immediately.  Remove soap buildup in the tub or shower on a regular basis.  Secure bath mats with non-slip, double-sided rug tape.  Remove throw rugs and tripping hazards from the floors. BEDROOMS  Install night lights.  Make sure a bedside light is easy to reach.  Do not use oversized bedding.  Keep a telephone by your bedside.  Have a firm chair with side arms to use for getting dressed.  Remove throw rugs and tripping hazards from the floor. KITCHEN  Keep handles on pots and pans turned toward the center of the stove. Use back burners when possible.  Clean up spills quickly and allow time for drying.  Avoid  walking on wet floors.  Avoid hot utensils and knives.  Position shelves so they are not too high or low.  Place commonly used objects within easy reach.  If necessary, use a sturdy step stool with a grab bar when reaching.  Keep electrical cables out of the way.  Do not use floor polish or wax that makes floors slippery. If you must use wax, use non-skid floor wax.  Remove throw rugs and tripping hazards from the floor. STAIRWAYS  Never leave objects on stairs.  Place handrails on both sides of stairways and use them. Fix any loose handrails. Make sure handrails on both sides of the stairways are as long as the stairs.  Check carpeting to make sure it is firmly attached along stairs. Make repairs to worn or loose carpet promptly.  Avoid placing throw rugs at the top or bottom of stairways, or properly secure the rug with carpet tape to prevent slippage. Get rid of throw rugs, if possible.  Have an electrician put in a light switch at the top and bottom of the stairs. OTHER FALL PREVENTION TIPS  Wear low-heel or rubber-soled shoes that are supportive and fit well. Wear closed toe shoes.  When using a stepladder, make sure it is fully opened and both spreaders are firmly locked. Do not climb a closed stepladder.  Add color or contrast paint or tape to grab bars and handrails in your  home. Place contrasting color strips on first and last steps.  Learn and use mobility aids as needed. Install an electrical emergency response system.  Turn on lights to avoid dark areas. Replace light bulbs that burn out immediately. Get light switches that glow.  Arrange furniture to create clear pathways. Keep furniture in the same place.  Firmly attach carpet with non-skid or double-sided tape.  Eliminate uneven floor surfaces.  Select a carpet pattern that does not visually hide the edge of steps.  Be aware of all pets. OTHER HOME SAFETY TIPS  Set the water temperature for 120 F  (48.8 C).  Keep emergency numbers on or near the telephone.  Keep smoke detectors on every level of the home and near sleeping areas. Document Released: 08/28/2002 Document Revised: 03/08/2012 Document Reviewed: 11/27/2011 [facility] Patient Information 2015 [place], [place]. This information is not intended to replace advice given to you by your health care provider. Make sure you discuss any questions you have with your health care provider.   Preventive Care for Adults Ages 11 and over  Blood pressure check.** / Every 1 to 2 years.  Lipid and cholesterol check.**/ Every 5 years beginning at age 34.  Lung cancer screening. / Every year if you are aged 35-80 years and have a 30-pack-year history of smoking and currently smoke or have quit within the past 15 years. Yearly screening is stopped once you have quit smoking for at least 15 years or develop a health problem that would prevent you from having lung cancer treatment.  Fecal occult blood test (FOBT) of stool. / Every year beginning at age 49 and continuing until age 69. You may not have to do this test if you get a colonoscopy every 10 years.  Flexible sigmoidoscopy** or colonoscopy.** / Every 5 years for a flexible sigmoidoscopy or every 10 years for a colonoscopy beginning at age 79 and continuing until age 37.  Hepatitis C blood test.** / For all people born from [birth year] through 1963 and any individual with known risks for hepatitis C.  Abdominal aortic aneurysm (AAA) screening.** / A one-time screening for ages 43 to 56 years who are current or former smokers.  Skin self-exam. / Monthly.  Influenza vaccine. / Every year.  Tetanus, diphtheria, and acellular pertussis (Tdap/Td) vaccine.** / 1 dose of Td every 10 years.  Varicella vaccine.** / Consult your health care provider.  Zoster vaccine.** / 1 dose for adults aged 6 years or older.  Pneumococcal 13-valent conjugate (PCV13) vaccine.** / Consult your health care  provider.  Pneumococcal polysaccharide (PPSV23) vaccine.** / 1 dose for all adults aged 39 years and older.  Meningococcal vaccine.** / Consult your health care provider.  Hepatitis A vaccine.** / Consult your health care provider.  Hepatitis B vaccine.** / Consult your health care provider.  Haemophilus influenzae type b (Hib) vaccine.** / Consult your health care provider. **Family history and personal history of risk and conditions may change your health care provider's recommendations. Document Released: 11/03/2001 Document Revised: 09/12/2013 Document Reviewed: 02/02/2011 [facility] Patient Information 2015 [place], [place]. This information is not intended to replace advice given to you by your health care provider. Make sure you discuss any questions you have with your health care provider.

## 2016-05-05 NOTE — Assessment & Plan Note (Signed)
Prediabetes: Check A1c, feet exam negative today HTN: Continue Lotensin, potassium, Lasix and metoprolol. Last BMP satisfactory. Check a CBC Hyperlipidemia: Last FLP satisfactory, continue Lipitor Hypothyroidism: Good med compliance, check a TSH CAD: to have surgery on his back, I see that cardiology has already cleared him. Back pain: To have surgery, recommend to continue muscle relaxant, minimize the use of diclofenac, try Tylenol instead. See instructions. RTC 6-8 months

## 2016-06-04 ENCOUNTER — Other Ambulatory Visit: Payer: Self-pay | Admitting: Internal Medicine

## 2016-06-26 DIAGNOSIS — D485 Neoplasm of uncertain behavior of skin: Secondary | ICD-10-CM | POA: Diagnosis not present

## 2016-06-26 DIAGNOSIS — D225 Melanocytic nevi of trunk: Secondary | ICD-10-CM | POA: Diagnosis not present

## 2016-06-26 DIAGNOSIS — Z1283 Encounter for screening for malignant neoplasm of skin: Secondary | ICD-10-CM | POA: Diagnosis not present

## 2016-06-26 DIAGNOSIS — Z08 Encounter for follow-up examination after completed treatment for malignant neoplasm: Secondary | ICD-10-CM | POA: Diagnosis not present

## 2016-06-26 DIAGNOSIS — X32XXXD Exposure to sunlight, subsequent encounter: Secondary | ICD-10-CM | POA: Diagnosis not present

## 2016-06-26 DIAGNOSIS — Z85820 Personal history of malignant melanoma of skin: Secondary | ICD-10-CM | POA: Diagnosis not present

## 2016-06-26 DIAGNOSIS — L570 Actinic keratosis: Secondary | ICD-10-CM | POA: Diagnosis not present

## 2016-06-27 ENCOUNTER — Other Ambulatory Visit: Payer: Self-pay | Admitting: Internal Medicine

## 2016-07-02 ENCOUNTER — Encounter (HOSPITAL_COMMUNITY)
Admission: RE | Admit: 2016-07-02 | Discharge: 2016-07-02 | Disposition: A | Discharge status: Home / Self Care | Payer: Medicare Other | Source: Ambulatory Visit | Attending: Neurosurgery | Admitting: Neurosurgery

## 2016-07-02 ENCOUNTER — Encounter (HOSPITAL_COMMUNITY): Payer: Self-pay

## 2016-07-02 DIAGNOSIS — Z01818 Encounter for other preprocedural examination: Secondary | ICD-10-CM | POA: Diagnosis not present

## 2016-07-02 HISTORY — DX: Nausea with vomiting, unspecified: R11.2

## 2016-07-02 HISTORY — DX: Other specified postprocedural states: Z98.890

## 2016-07-02 LAB — BASIC METABOLIC PANEL
ANION GAP: 6 (ref 5–15)
BUN: 25 mg/dL — ABNORMAL HIGH (ref 6–20)
CALCIUM: 9.6 mg/dL (ref 8.9–10.3)
CHLORIDE: 109 mmol/L (ref 101–111)
CO2: 26 mmol/L (ref 22–32)
CREATININE: 1.08 mg/dL (ref 0.61–1.24)
GFR calc non Af Amer: >60 mL/min (ref >60)
GLUCOSE: 73 mg/dL (ref 65–99)
Potassium: 4.1 mmol/L (ref 3.5–5.1)
Sodium: 141 mmol/L (ref 135–145)

## 2016-07-02 LAB — CBC
HEMATOCRIT: 46.4 % (ref 39.0–52.0)
HEMOGLOBIN: 15.5 g/dL (ref 13.0–17.0)
MCH: 30.5 pg (ref 26.0–34.0)
MCHC: 33.4 g/dL (ref 30.0–36.0)
MCV: 91.2 fL (ref 78.0–100.0)
Platelets: 244 10*3/uL (ref 150–400)
RBC: 5.09 MIL/uL (ref 4.22–5.81)
RDW: 14.4 % (ref 11.5–15.5)
WBC: 8.8 10*3/uL (ref 4.0–10.5)

## 2016-07-02 LAB — SURGICAL PCR SCREEN
MRSA, PCR: NEGATIVE
Staphylococcus aureus: NEGATIVE

## 2016-07-02 NOTE — Progress Notes (Signed)
PCP - Dr. [name] Cardiology- Dr. Johnsie Cancel   EKG - 01/21/16 CXR - denies  Echo - 2008 Stress test - 03/2016 Cardiac Cath - denies  Patient denies chest pain and shortness of breath at PAT appointment.    Cardiac clearance note - 04/24/16 Per patient, Aspirin 325 is to be stopped on 07/06/16.

## 2016-07-02 NOTE — Progress Notes (Signed)
Anesthesia PAT Evaluation (done by Dr. Linna Caprice): Patient is a 80 year old male scheduled for L2-3, L3-4, L4-5 lumbar laminectomy and foraminotomy, left L5-S1 discectomy on 07/13/16 by Dr. Arnoldo Morale. Patient's daughter is one of our Short Stay Surgical nurses.  History includes former smoker, post-operative N/V, CAD s/p DES CX to distal RCA '06, melanoma, hypothyroidism, multiple thyroid nodules, RUE tremor, BPH, HLD, HTN, mild carotid artery stenosis, venous insufficiency, right TKA 06/25/14.  PCP is Dr. Larose Kells.  Cardiologist is Dr. Johnsie Cancel who cleared patient for surgery following recent stress test. Last office visit 01/14/16.  Meds include amoxicillin PRN dental appointment, ASA, Lipitor, baclofen, benazepril, diltiazem, Lasix, levothyroxine, Lumigan, Lopressor, Nitro. He reports ASA to be held starting 07/06/16.  BP 129/67   Pulse 62   Temp 36.7 C   Resp 20   Ht 5\' 9"  (1.753 m)   Wt 192 lb 11.2 oz (87.4 kg)   SpO2 97%   BMI 28.46 kg/m   03/31/16 Nuclear stress test:  Nuclear stress EF: 62%. Normal LV function  There was no ST segment deviation noted during stress.  The study is normal. There is no evidence of ischemia or previous infarction.  This is a low risk study.  01/21/16 EKG: SR with PACs in a pattern of bigeminy, LAFB.  01/10/16 Carotid U/S: 1-39% BICA stenosis, patent vertebral arteries with antegrade flow, normal subclavian arteries bilaterally. Abnormal right thyroid appearance.  02/28/07 Echo: SUMMARY - Overall left ventricular systolic function was normal. Left    ventricular ejection fraction was estimated to be 60 %. There    were no left ventricular regional wall motion abnormalities. - The aortic valve was mildly calcified. - The left atrium was mildly dilated. IMPRESSIONS - There was no echocardiographic evidence for a cardiac source of    embolism.  08/04/05 CARDIAC CATH:  - Left main coronary artery was normal. - Left anterior descending  artery was heavily calcified. - The proximal vessel had 30-40% tubular disease.  The mid vessel had 50%  tubular disease.  First diagonal branch had 40% ostial disease. - The circumflex coronary artery was nondominant. - The proximal circumflex coronary artery was also calcified.  There was 30% tubular disease.  The mid vessel at the takeoff of a large obtuse marginal branch had a 70-80% focal lesion. - The patient had left-to-right collaterals from the AV circumflex and septal perforators to the distal right coronary artery. - The right coronary artery had 40-50% tubular disease proximally at the ostium.  Mid vessel had 30% multidiscrete lesions.  The distal vessel prior to the takeoff of the PDA had an 80-90% lesion with initial TIMI 2 flow. - RAO ventriculography showed normal EF 55%.  There is minimal inferior basal wall hypokinesis with no MR.  Aortic pressure was 120/65, LV pressure is 120/12.  08/05/05 PCI (Dr. Sabino Snipes): PROCEDURES: 1.  Drug-eluting stent placement in the mid-circumflex. 2.  Drug-eluting stent placement (x2) to the distal right coronary artery. COMPLICATIONS:  Ventricular fibrillation, treated promptly and successfully with two DC counter shocks. IMPRESSION/RECOMMENDATIONS:  Successful percutaneous intervention on both the circumflex and the right coronary artery using drug-eluting stents.  He will be continued on aspirin and Plavix indefinitely.  Due to apparently a fairly low threshold for ventricular fibrillation, beta blocker would be increased.  12/27/12 Thyroid U/S: IMPRESSION: Markedly inhomogeneous thyroid without discrete nodule or dominant lesion.  Preoperative labs noted. Cr 1.08. CBC WNL. Glucose 73. A1c was 5.8 and TSH was 0.37 on 05/04/16.  If no  acute changes then I anticipate that he can proceed as planned.  George Hugh [facility] Short Stay Center/Anesthesiology Phone [phone] 07/02/2016 4:53 PM

## 2016-07-02 NOTE — Pre-Procedure Instructions (Signed)
[name]  07/02/2016      [facility] [place] [address] [place] [address] Phone: [phone] Fax: [phone]    Your procedure is scheduled on Monday, October 23rd, 2017.  Report to [facility] Admitting at 5:30 A.M.   Call this number if you have problems the morning of surgery:  [phone]   Remember:  Do not eat food or drink liquids after midnight.   Take these medicines the morning of surgery with A SIP OF WATER: Baclofen (Lioresal), Diltiazem (Dilacor XR), Levothyroxine (Synthroid), Metoprolol Tartrate (Lopressor).  Follow your MD's instruction and stop Aspirin on October 16th, 2017.   7 days prior to surgery, stop taking: Aspirin, NSAIDS, Diclofenac (Dilacor XR), Ibuprofen, Advil, Motrin, BC's, Goody's, Naproxen, Aleve, Fish oil, all herbal medications, and all vitamins.    Do not wear jewelry, make-up or nail polish.  Do not wear lotions, powders, or perfumes, or deoderant.  Men may shave face and neck.  Do not bring valuables to the hospital.  [facility] is not responsible for any belongings or valuables.  Contacts, dentures or bridgework may not be worn into surgery.  Leave your suitcase in the car.  After surgery it may be brought to your room.  For patients admitted to the hospital, discharge time will be determined by your treatment team.  Patients discharged the day of surgery will not be allowed to drive home.   Special instructions:  Preparing for Surgery.   Please read over the following fact sheets that you were given. MRSA Information    [facility]- Preparing For Surgery  Before surgery, you can play an important role. Because skin is not sterile, your skin needs to be as free of germs as possible. You can reduce the number of germs on your skin by washing with CHG (chlorahexidine gluconate) Soap before surgery.  CHG is an antiseptic  cleaner which kills germs and bonds with the skin to continue killing germs even after washing.  Please do not use if you have an allergy to CHG or antibacterial soaps. If your skin becomes reddened/irritated stop using the CHG.  Do not shave (including legs and underarms) for at least 48 hours prior to first CHG shower. It is OK to shave your face.  Please follow these instructions carefully.   1. Shower the NIGHT BEFORE SURGERY and the MORNING OF SURGERY with CHG.   2. If you chose to wash your hair, wash your hair first as usual with your normal shampoo.  3. After you shampoo, rinse your hair and body thoroughly to remove the shampoo.  4. Use CHG as you would any other liquid soap. You can apply CHG directly to the skin and wash gently with a scrungie or a clean washcloth.   5. Apply the CHG Soap to your body ONLY FROM THE NECK DOWN.  Do not use on open wounds or open sores. Avoid contact with your eyes, ears, mouth and genitals (private parts). Wash genitals (private parts) with your normal soap.  6. Wash thoroughly, paying special attention to the area where your surgery will be performed.  7. Thoroughly rinse your body with warm water from the neck down.  8. DO NOT shower/wash with your normal soap after using and rinsing off the CHG Soap.  9. Pat yourself dry with a CLEAN TOWEL.   10. Wear CLEAN PAJAMAS  11. Place CLEAN SHEETS on your bed the night of your first shower and DO NOT SLEEP WITH PETS.  Day of Surgery: Do not apply any deodorants/lotions. Please wear clean clothes to the hospital/surgery center.

## 2016-07-10 DIAGNOSIS — H401134 Primary open-angle glaucoma, bilateral, indeterminate stage: Secondary | ICD-10-CM | POA: Diagnosis not present

## 2016-07-10 LAB — HM DIABETES EYE EXAM

## 2016-07-13 ENCOUNTER — Inpatient Hospital Stay (HOSPITAL_COMMUNITY): Payer: Medicare Other | Admitting: Anesthesiology

## 2016-07-13 ENCOUNTER — Inpatient Hospital Stay (HOSPITAL_COMMUNITY): Payer: Medicare Other | Admitting: Vascular Surgery

## 2016-07-13 ENCOUNTER — Inpatient Hospital Stay (HOSPITAL_COMMUNITY): Payer: Medicare Other

## 2016-07-13 ENCOUNTER — Encounter (HOSPITAL_COMMUNITY): Payer: Self-pay | Admitting: *Deleted

## 2016-07-13 ENCOUNTER — Encounter (HOSPITAL_COMMUNITY)
Admission: RE | Disposition: A | Discharge status: Home / Self Care | Payer: Self-pay | Source: Ambulatory Visit | Attending: Neurosurgery

## 2016-07-13 ENCOUNTER — Inpatient Hospital Stay (HOSPITAL_COMMUNITY)
Admission: RE | Admit: 2016-07-13 | Discharge: 2016-07-14 | DRG: 517 | Disposition: A | Discharge status: Home / Self Care | Payer: Medicare Other | Source: Ambulatory Visit | Attending: Neurosurgery | Admitting: Neurosurgery

## 2016-07-13 DIAGNOSIS — Z792 Long term (current) use of antibiotics: Secondary | ICD-10-CM

## 2016-07-13 DIAGNOSIS — Z85820 Personal history of malignant melanoma of skin: Secondary | ICD-10-CM

## 2016-07-13 DIAGNOSIS — E785 Hyperlipidemia, unspecified: Secondary | ICD-10-CM | POA: Diagnosis present

## 2016-07-13 DIAGNOSIS — Z23 Encounter for immunization: Secondary | ICD-10-CM | POA: Diagnosis not present

## 2016-07-13 DIAGNOSIS — N400 Enlarged prostate without lower urinary tract symptoms: Secondary | ICD-10-CM | POA: Diagnosis present

## 2016-07-13 DIAGNOSIS — Z96651 Presence of right artificial knee joint: Secondary | ICD-10-CM | POA: Diagnosis present

## 2016-07-13 DIAGNOSIS — Z955 Presence of coronary angioplasty implant and graft: Secondary | ICD-10-CM | POA: Diagnosis not present

## 2016-07-13 DIAGNOSIS — M5136 Other intervertebral disc degeneration, lumbar region: Secondary | ICD-10-CM | POA: Diagnosis not present

## 2016-07-13 DIAGNOSIS — R338 Other retention of urine: Secondary | ICD-10-CM | POA: Diagnosis not present

## 2016-07-13 DIAGNOSIS — Z800 Family history of malignant neoplasm of digestive organs: Secondary | ICD-10-CM

## 2016-07-13 DIAGNOSIS — Z79899 Other long term (current) drug therapy: Secondary | ICD-10-CM

## 2016-07-13 DIAGNOSIS — M48062 Spinal stenosis, lumbar region with neurogenic claudication: Principal | ICD-10-CM | POA: Diagnosis present

## 2016-07-13 DIAGNOSIS — Z96643 Presence of artificial hip joint, bilateral: Secondary | ICD-10-CM | POA: Diagnosis not present

## 2016-07-13 DIAGNOSIS — I10 Essential (primary) hypertension: Secondary | ICD-10-CM | POA: Diagnosis not present

## 2016-07-13 DIAGNOSIS — Z419 Encounter for procedure for purposes other than remedying health state, unspecified: Secondary | ICD-10-CM

## 2016-07-13 DIAGNOSIS — Z833 Family history of diabetes mellitus: Secondary | ICD-10-CM

## 2016-07-13 DIAGNOSIS — Z8673 Personal history of transient ischemic attack (TIA), and cerebral infarction without residual deficits: Secondary | ICD-10-CM

## 2016-07-13 DIAGNOSIS — E042 Nontoxic multinodular goiter: Secondary | ICD-10-CM | POA: Diagnosis not present

## 2016-07-13 DIAGNOSIS — Z86010 Personal history of colonic polyps: Secondary | ICD-10-CM

## 2016-07-13 DIAGNOSIS — M5117 Intervertebral disc disorders with radiculopathy, lumbosacral region: Secondary | ICD-10-CM | POA: Diagnosis present

## 2016-07-13 DIAGNOSIS — I2510 Atherosclerotic heart disease of native coronary artery without angina pectoris: Secondary | ICD-10-CM | POA: Diagnosis not present

## 2016-07-13 DIAGNOSIS — M5116 Intervertebral disc disorders with radiculopathy, lumbar region: Secondary | ICD-10-CM | POA: Diagnosis present

## 2016-07-13 DIAGNOSIS — G252 Other specified forms of tremor: Secondary | ICD-10-CM | POA: Diagnosis not present

## 2016-07-13 DIAGNOSIS — Z7982 Long term (current) use of aspirin: Secondary | ICD-10-CM | POA: Diagnosis not present

## 2016-07-13 DIAGNOSIS — Z87891 Personal history of nicotine dependence: Secondary | ICD-10-CM | POA: Diagnosis not present

## 2016-07-13 DIAGNOSIS — Z791 Long term (current) use of non-steroidal anti-inflammatories (NSAID): Secondary | ICD-10-CM | POA: Diagnosis not present

## 2016-07-13 DIAGNOSIS — I6529 Occlusion and stenosis of unspecified carotid artery: Secondary | ICD-10-CM | POA: Diagnosis present

## 2016-07-13 DIAGNOSIS — M5489 Other dorsalgia: Secondary | ICD-10-CM | POA: Diagnosis present

## 2016-07-13 DIAGNOSIS — M48061 Spinal stenosis, lumbar region without neurogenic claudication: Secondary | ICD-10-CM | POA: Diagnosis not present

## 2016-07-13 DIAGNOSIS — I872 Venous insufficiency (chronic) (peripheral): Secondary | ICD-10-CM | POA: Diagnosis not present

## 2016-07-13 DIAGNOSIS — E039 Hypothyroidism, unspecified: Secondary | ICD-10-CM | POA: Diagnosis not present

## 2016-07-13 DIAGNOSIS — Z8249 Family history of ischemic heart disease and other diseases of the circulatory system: Secondary | ICD-10-CM

## 2016-07-13 HISTORY — PX: LUMBAR LAMINECTOMY/DECOMPRESSION MICRODISCECTOMY: SHX5026

## 2016-07-13 SURGERY — LUMBAR LAMINECTOMY/DECOMPRESSION MICRODISCECTOMY 4 LEVEL
Anesthesia: Regional | Site: Back | Laterality: Left

## 2016-07-13 MED ORDER — SODIUM CHLORIDE 0.9 % IR SOLN
Status: DC | PRN
  Administered 2016-07-13: 09:00:00

## 2016-07-13 MED ORDER — LACTATED RINGERS IV SOLN: INTRAVENOUS | Status: DC

## 2016-07-13 MED ORDER — CHLORHEXIDINE GLUCONATE CLOTH 2 % EX PADS: 6.0000 | MEDICATED_PAD | Freq: Once | CUTANEOUS | Status: DC

## 2016-07-13 MED ORDER — BUPIVACAINE LIPOSOME 1.3 % IJ SUSP
20.0000 mL | INTRAMUSCULAR | Status: DC
  Filled 2016-07-13: qty 20

## 2016-07-13 MED ORDER — GLYCOPYRROLATE 0.2 MG/ML IJ SOLN
INTRAMUSCULAR | Status: DC | PRN
  Administered 2016-07-13: 0.6 mg via INTRAVENOUS

## 2016-07-13 MED ORDER — HYDROCODONE-ACETAMINOPHEN 5-325 MG PO TABS
1.0000 | ORAL_TABLET | ORAL | Status: DC | PRN
  Administered 2016-07-13: 1 via ORAL
  Filled 2016-07-13: qty 1

## 2016-07-13 MED ORDER — FUROSEMIDE 40 MG PO TABS
40.0000 mg | ORAL_TABLET | Freq: Every day | ORAL | Status: DC
  Administered 2016-07-14: 40 mg via ORAL
  Filled 2016-07-13: qty 1

## 2016-07-13 MED ORDER — LIDOCAINE 2% (20 MG/ML) 5 ML SYRINGE
INTRAMUSCULAR | Status: AC
  Filled 2016-07-13: qty 5

## 2016-07-13 MED ORDER — DIAZEPAM 5 MG PO TABS: 5.0000 mg | ORAL_TABLET | Freq: Four times a day (QID) | ORAL | Status: DC | PRN

## 2016-07-13 MED ORDER — INFLUENZA VAC SPLIT QUAD 0.5 ML IM SUSY
0.5000 mL | PREFILLED_SYRINGE | INTRAMUSCULAR | Status: AC
  Administered 2016-07-14: 0.5 mL via INTRAMUSCULAR
  Filled 2016-07-13: qty 0.5

## 2016-07-13 MED ORDER — TAMSULOSIN HCL 0.4 MG PO CAPS
0.4000 mg | ORAL_CAPSULE | Freq: Every day | ORAL | Status: DC
  Administered 2016-07-13 – 2016-07-14 (×2): 0.4 mg via ORAL
  Filled 2016-07-13 (×2): qty 1

## 2016-07-13 MED ORDER — BUPIVACAINE LIPOSOME 1.3 % IJ SUSP
INTRAMUSCULAR | Status: DC | PRN
  Administered 2016-07-13: 20 mL

## 2016-07-13 MED ORDER — ONDANSETRON HCL 4 MG/2ML IJ SOLN
4.0000 mg | INTRAMUSCULAR | Status: DC | PRN
Start: 2016-07-13 — End: 2016-07-14

## 2016-07-13 MED ORDER — MIDAZOLAM HCL 5 MG/5ML IJ SOLN
INTRAMUSCULAR | Status: DC | PRN
  Administered 2016-07-13 (×2): 1 mg via INTRAVENOUS

## 2016-07-13 MED ORDER — DILTIAZEM HCL ER COATED BEADS 180 MG PO CP24
180.0000 mg | ORAL_CAPSULE | Freq: Every morning | ORAL | Status: DC
  Administered 2016-07-14: 180 mg via ORAL
  Filled 2016-07-13: qty 1

## 2016-07-13 MED ORDER — PHENOL 1.4 % MT LIQD: 1.0000 | OROMUCOSAL | Status: DC | PRN

## 2016-07-13 MED ORDER — PROPOFOL 10 MG/ML IV BOLUS
INTRAVENOUS | Status: AC
  Filled 2016-07-13: qty 20

## 2016-07-13 MED ORDER — MENTHOL 3 MG MT LOZG: 1.0000 | LOZENGE | OROMUCOSAL | Status: DC | PRN

## 2016-07-13 MED ORDER — ONDANSETRON HCL 4 MG/2ML IJ SOLN
INTRAMUSCULAR | Status: DC | PRN
  Administered 2016-07-13: 4 mg via INTRAVENOUS

## 2016-07-13 MED ORDER — ROCURONIUM BROMIDE 100 MG/10ML IV SOLN
INTRAVENOUS | Status: DC | PRN
  Administered 2016-07-13 (×2): 10 mg via INTRAVENOUS
  Administered 2016-07-13: 50 mg via INTRAVENOUS

## 2016-07-13 MED ORDER — DICLOFENAC SODIUM 50 MG PO TBEC
50.0000 mg | DELAYED_RELEASE_TABLET | Freq: Two times a day (BID) | ORAL | Status: DC
  Administered 2016-07-13 – 2016-07-14 (×3): 50 mg via ORAL
  Filled 2016-07-13 (×3): qty 1

## 2016-07-13 MED ORDER — ACETAMINOPHEN 325 MG PO TABS: 650.0000 mg | ORAL_TABLET | ORAL | Status: DC | PRN

## 2016-07-13 MED ORDER — MIDAZOLAM HCL 2 MG/2ML IJ SOLN
INTRAMUSCULAR | Status: AC
  Filled 2016-07-13: qty 2

## 2016-07-13 MED ORDER — ACETAMINOPHEN 650 MG RE SUPP: 650.0000 mg | RECTAL | Status: DC | PRN

## 2016-07-13 MED ORDER — NEOSTIGMINE METHYLSULFATE 10 MG/10ML IV SOLN
INTRAVENOUS | Status: DC | PRN
  Administered 2016-07-13: 4 mg via INTRAVENOUS

## 2016-07-13 MED ORDER — FENTANYL CITRATE (PF) 100 MCG/2ML IJ SOLN
INTRAMUSCULAR | Status: AC
  Filled 2016-07-13: qty 2

## 2016-07-13 MED ORDER — SURGIFOAM 100 EX MISC
CUTANEOUS | Status: DC | PRN
  Administered 2016-07-13: 09:00:00 via TOPICAL

## 2016-07-13 MED ORDER — MORPHINE SULFATE (PF) 2 MG/ML IV SOLN: 1.0000 mg | INTRAVENOUS | Status: DC | PRN

## 2016-07-13 MED ORDER — BACITRACIN ZINC 500 UNIT/GM EX OINT
TOPICAL_OINTMENT | CUTANEOUS | Status: DC | PRN
  Administered 2016-07-13: 1 via TOPICAL

## 2016-07-13 MED ORDER — FENTANYL CITRATE (PF) 100 MCG/2ML IJ SOLN
INTRAMUSCULAR | Status: DC | PRN
  Administered 2016-07-13 (×2): 50 ug via INTRAVENOUS
  Administered 2016-07-13: 100 ug via INTRAVENOUS

## 2016-07-13 MED ORDER — DOCUSATE SODIUM 100 MG PO CAPS
100.0000 mg | ORAL_CAPSULE | Freq: Two times a day (BID) | ORAL | Status: DC
  Administered 2016-07-13 – 2016-07-14 (×3): 100 mg via ORAL
  Filled 2016-07-13 (×3): qty 1

## 2016-07-13 MED ORDER — ROCURONIUM BROMIDE 10 MG/ML (PF) SYRINGE
PREFILLED_SYRINGE | INTRAVENOUS | Status: AC
  Filled 2016-07-13: qty 10

## 2016-07-13 MED ORDER — CEFAZOLIN SODIUM-DEXTROSE 2-4 GM/100ML-% IV SOLN
INTRAVENOUS | Status: AC
  Filled 2016-07-13: qty 100

## 2016-07-13 MED ORDER — THROMBIN 5000 UNITS EX SOLR
CUTANEOUS | Status: AC
  Filled 2016-07-13: qty 5000

## 2016-07-13 MED ORDER — DEXAMETHASONE SODIUM PHOSPHATE 10 MG/ML IJ SOLN
INTRAMUSCULAR | Status: DC | PRN
  Administered 2016-07-13: 10 mg via INTRAVENOUS

## 2016-07-13 MED ORDER — ONDANSETRON HCL 4 MG/2ML IJ SOLN
INTRAMUSCULAR | Status: AC
  Filled 2016-07-13: qty 2

## 2016-07-13 MED ORDER — PROPOFOL 10 MG/ML IV BOLUS
INTRAVENOUS | Status: DC | PRN
  Administered 2016-07-13: 140 mg via INTRAVENOUS

## 2016-07-13 MED ORDER — FENTANYL CITRATE (PF) 100 MCG/2ML IJ SOLN: 25.0000 ug | INTRAMUSCULAR | Status: DC | PRN

## 2016-07-13 MED ORDER — 0.9 % SODIUM CHLORIDE (POUR BTL) OPTIME
TOPICAL | Status: DC | PRN
  Administered 2016-07-13: 1000 mL

## 2016-07-13 MED ORDER — CEFAZOLIN SODIUM-DEXTROSE 2-4 GM/100ML-% IV SOLN
2.0000 g | Freq: Three times a day (TID) | INTRAVENOUS | Status: AC
  Administered 2016-07-13 (×2): 2 g via INTRAVENOUS
  Filled 2016-07-13 (×2): qty 100

## 2016-07-13 MED ORDER — BUPIVACAINE-EPINEPHRINE (PF) 0.5% -1:200000 IJ SOLN
INTRAMUSCULAR | Status: DC | PRN
  Administered 2016-07-13: 10 mL

## 2016-07-13 MED ORDER — BUPIVACAINE-EPINEPHRINE (PF) 0.5% -1:200000 IJ SOLN
INTRAMUSCULAR | Status: AC
  Filled 2016-07-13: qty 30

## 2016-07-13 MED ORDER — NITROGLYCERIN 0.4 MG SL SUBL: 0.4000 mg | SUBLINGUAL_TABLET | SUBLINGUAL | Status: DC | PRN

## 2016-07-13 MED ORDER — EPHEDRINE SULFATE 50 MG/ML IJ SOLN
INTRAMUSCULAR | Status: DC | PRN
  Administered 2016-07-13 (×9): 5 mg via INTRAVENOUS

## 2016-07-13 MED ORDER — BENAZEPRIL HCL 20 MG PO TABS
30.0000 mg | ORAL_TABLET | Freq: Every morning | ORAL | Status: DC
  Administered 2016-07-13 – 2016-07-14 (×2): 30 mg via ORAL
  Filled 2016-07-13 (×2): qty 1

## 2016-07-13 MED ORDER — CEFAZOLIN SODIUM-DEXTROSE 2-4 GM/100ML-% IV SOLN
2.0000 g | INTRAVENOUS | Status: AC
  Administered 2016-07-13: 2 g via INTRAVENOUS

## 2016-07-13 MED ORDER — ONDANSETRON HCL 4 MG/2ML IJ SOLN: 4.0000 mg | Freq: Once | INTRAMUSCULAR | Status: DC | PRN

## 2016-07-13 MED ORDER — LIDOCAINE HCL (CARDIAC) 20 MG/ML IV SOLN
INTRAVENOUS | Status: DC | PRN
  Administered 2016-07-13: 40 mg via INTRAVENOUS

## 2016-07-13 MED ORDER — PHENYLEPHRINE HCL 10 MG/ML IJ SOLN
INTRAMUSCULAR | Status: DC | PRN
  Administered 2016-07-13: 40 ug via INTRAVENOUS
  Administered 2016-07-13 (×3): 80 ug via INTRAVENOUS
  Administered 2016-07-13: 40 ug via INTRAVENOUS
  Administered 2016-07-13: 80 ug via INTRAVENOUS

## 2016-07-13 MED ORDER — OXYCODONE HCL 5 MG/5ML PO SOLN: 5.0000 mg | Freq: Once | ORAL | Status: DC | PRN

## 2016-07-13 MED ORDER — BISACODYL 10 MG RE SUPP: 10.0000 mg | Freq: Every day | RECTAL | Status: DC | PRN

## 2016-07-13 MED ORDER — OXYCODONE HCL 5 MG PO TABS: 5.0000 mg | ORAL_TABLET | Freq: Once | ORAL | Status: DC | PRN

## 2016-07-13 MED ORDER — OXYCODONE-ACETAMINOPHEN 5-325 MG PO TABS: 1.0000 | ORAL_TABLET | ORAL | Status: DC | PRN

## 2016-07-13 MED ORDER — THROMBIN 20000 UNITS EX SOLR
CUTANEOUS | Status: AC
  Filled 2016-07-13: qty 20000

## 2016-07-13 MED ORDER — LACTATED RINGERS IV SOLN
INTRAVENOUS | Status: DC | PRN
  Administered 2016-07-13 (×2): via INTRAVENOUS

## 2016-07-13 MED ORDER — ATORVASTATIN CALCIUM 20 MG PO TABS
20.0000 mg | ORAL_TABLET | Freq: Every day | ORAL | Status: DC
  Administered 2016-07-13: 20 mg via ORAL
  Filled 2016-07-13: qty 1

## 2016-07-13 MED ORDER — LEVOTHYROXINE SODIUM 100 MCG PO TABS
200.0000 ug | ORAL_TABLET | Freq: Every day | ORAL | Status: DC
  Administered 2016-07-14: 200 ug via ORAL
  Filled 2016-07-13: qty 2

## 2016-07-13 MED ORDER — LATANOPROST 0.005 % OP SOLN
1.0000 [drp] | Freq: Every day | OPHTHALMIC | Status: DC
  Administered 2016-07-13: 1 [drp] via OPHTHALMIC
  Filled 2016-07-13: qty 2.5

## 2016-07-13 MED ORDER — BACITRACIN ZINC 500 UNIT/GM EX OINT
TOPICAL_OINTMENT | CUTANEOUS | Status: AC
  Filled 2016-07-13: qty 28.35

## 2016-07-13 MED ORDER — DEXAMETHASONE SODIUM PHOSPHATE 10 MG/ML IJ SOLN
INTRAMUSCULAR | Status: AC
  Filled 2016-07-13: qty 1

## 2016-07-13 MED ORDER — BACLOFEN 10 MG PO TABS
10.0000 mg | ORAL_TABLET | Freq: Three times a day (TID) | ORAL | Status: DC
  Administered 2016-07-13 – 2016-07-14 (×2): 10 mg via ORAL
  Filled 2016-07-13 (×5): qty 1

## 2016-07-13 MED ORDER — ALUM & MAG HYDROXIDE-SIMETH 200-200-20 MG/5ML PO SUSP: 30.0000 mL | Freq: Four times a day (QID) | ORAL | Status: DC | PRN

## 2016-07-13 MED ORDER — METOPROLOL TARTRATE 25 MG PO TABS
25.0000 mg | ORAL_TABLET | Freq: Two times a day (BID) | ORAL | Status: DC
  Administered 2016-07-14: 25 mg via ORAL
  Filled 2016-07-13 (×2): qty 1

## 2016-07-13 SURGICAL SUPPLY — 49 items
APL SKNCLS STERI-STRIP NONHPOA (GAUZE/BANDAGES/DRESSINGS) ×1
BAG DECANTER FOR FLEXI CONT (MISCELLANEOUS) ×3 IMPLANT
BENZOIN TINCTURE PRP APPL 2/3 (GAUZE/BANDAGES/DRESSINGS) ×3 IMPLANT
BLADE CLIPPER SURG (BLADE) ×2 IMPLANT
BUR MATCHSTICK NEURO 3.0 LAGG (BURR) ×3 IMPLANT
BUR PRECISION FLUTE 6.0 (BURR) ×3 IMPLANT
CANISTER SUCT 3000ML PPV (MISCELLANEOUS) ×3 IMPLANT
CLOSURE WOUND 1/2 X4 (GAUZE/BANDAGES/DRESSINGS) ×1
DRAPE LAPAROTOMY 100X72X124 (DRAPES) ×3 IMPLANT
DRAPE MICROSCOPE LEICA (MISCELLANEOUS) ×1 IMPLANT
DRAPE POUCH INSTRU U-SHP 10X18 (DRAPES) ×3 IMPLANT
DRAPE PROXIMA HALF (DRAPES) ×2 IMPLANT
DRAPE SURG 17X23 STRL (DRAPES) ×12 IMPLANT
ELECT BLADE 4.0 EZ CLEAN MEGAD (MISCELLANEOUS) ×3
ELECT REM PT RETURN 9FT ADLT (ELECTROSURGICAL) ×3
ELECTRODE BLDE 4.0 EZ CLN MEGD (MISCELLANEOUS) ×1 IMPLANT
ELECTRODE REM PT RTRN 9FT ADLT (ELECTROSURGICAL) ×1 IMPLANT
GAUZE SPONGE 4X4 12PLY STRL (GAUZE/BANDAGES/DRESSINGS) ×3 IMPLANT
GAUZE SPONGE 4X4 16PLY XRAY LF (GAUZE/BANDAGES/DRESSINGS) ×2 IMPLANT
GLOVE BIO SURGEON STRL SZ8 (GLOVE) ×3 IMPLANT
GLOVE BIO SURGEON STRL SZ8.5 (GLOVE) ×3 IMPLANT
GLOVE BIOGEL PI IND STRL 7.0 (GLOVE) IMPLANT
GLOVE BIOGEL PI INDICATOR 7.0 (GLOVE) ×2
GLOVE ECLIPSE 9.0 STRL (GLOVE) ×2 IMPLANT
GLOVE SURG SS PI 6.5 STRL IVOR (GLOVE) ×6 IMPLANT
GOWN STRL REUS W/ TWL LRG LVL3 (GOWN DISPOSABLE) IMPLANT
GOWN STRL REUS W/ TWL XL LVL3 (GOWN DISPOSABLE) ×1 IMPLANT
GOWN STRL REUS W/TWL 2XL LVL3 (GOWN DISPOSABLE) IMPLANT
GOWN STRL REUS W/TWL LRG LVL3 (GOWN DISPOSABLE) ×3
GOWN STRL REUS W/TWL XL LVL3 (GOWN DISPOSABLE) ×6
KIT BASIN OR (CUSTOM PROCEDURE TRAY) ×3 IMPLANT
KIT ROOM TURNOVER OR (KITS) ×3 IMPLANT
NDL HYPO 21X1.5 SAFETY (NEEDLE) IMPLANT
NEEDLE HYPO 21X1.5 SAFETY (NEEDLE) ×3 IMPLANT
NEEDLE HYPO 22GX1.5 SAFETY (NEEDLE) ×3 IMPLANT
NS IRRIG 1000ML POUR BTL (IV SOLUTION) ×3 IMPLANT
PACK LAMINECTOMY NEURO (CUSTOM PROCEDURE TRAY) ×3 IMPLANT
PAD ARMBOARD 7.5X6 YLW CONV (MISCELLANEOUS) ×9 IMPLANT
PATTIES SURGICAL .5 X1 (DISPOSABLE) IMPLANT
RUBBERBAND STERILE (MISCELLANEOUS) ×2 IMPLANT
SPONGE SURGIFOAM ABS GEL 100 (HEMOSTASIS) ×3 IMPLANT
STRIP CLOSURE SKIN 1/2X4 (GAUZE/BANDAGES/DRESSINGS) ×2 IMPLANT
SUT VIC AB 1 CT1 18XBRD ANBCTR (SUTURE) ×2 IMPLANT
SUT VIC AB 1 CT1 8-18 (SUTURE) ×9
SUT VIC AB 2-0 CP2 18 (SUTURE) ×8 IMPLANT
TAPE CLOTH SURG 4X10 WHT LF (GAUZE/BANDAGES/DRESSINGS) ×2 IMPLANT
TOWEL OR 17X24 6PK STRL BLUE (TOWEL DISPOSABLE) ×3 IMPLANT
TOWEL OR 17X26 10 PK STRL BLUE (TOWEL DISPOSABLE) ×3 IMPLANT
WATER STERILE IRR 1000ML POUR (IV SOLUTION) ×3 IMPLANT

## 2016-07-13 NOTE — Progress Notes (Signed)
Patient ID: [name], male   DOB: 06-25-1936, 80 y.o.   MRN: [id number] Subjective:  The patient is alert and pleasant. He looks well. He is in no apparent distress.  Objective: Vital signs in last 24 hours: Temp:  [97.5 F (36.4 C)-98.5 F (36.9 C)] 97.8 F (36.6 C) (10/23 1625) Pulse Rate:  [59-70] 61 (10/23 1625) Resp:  [15-20] 18 (10/23 1625) BP: (107-135)/(50-66) 111/62 (10/23 1625) SpO2:  [94 %-97 %] 95 % (10/23 1625) Weight:  [87.1 kg (192 lb)] 87.1 kg (192 lb) (10/23 [id number])  Intake/Output from previous day: No intake/output data recorded. Intake/Output this shift: Total I/O In: 2480 [P.O.:480; I.V.:1800; Other:100; IV Piggyback:100] Out: 250 [Blood:250]  Physical exam the patient is alert and pleasant. His strength is grossly normal slow extremities.  Lab Results: No results for input(s): WBC, HGB, HCT, PLT in the last 72 hours. BMET No results for input(s): NA, K, CL, CO2, GLUCOSE, BUN, CREATININE, CALCIUM in the last 72 hours.  Studies/Results: Dg Lumbar Spine 1 View  Addendum Date: 07/13/2016   ADDENDUM REPORT: 07/13/2016 08:59 ADDENDUM: After initial interpretation of the first image, a second image was appended to this exam. Image number 2: Cross-table lateral portable rib x-ray of the lumbar spine. Posterior tissue retractors. Blunt tip metallic probe posteriorly with the tip projecting over the inferior articulating facet of L4. Degenerate disc disease with disc height loss and bilateral facet arthropathy throughout the lumbar spine. Electronically Signed   By: Kathreen Devoid   On: 07/13/2016 08:59   Result Date: 07/13/2016 CLINICAL DATA:  Lumbar laminectomy and foraminectomy EXAM: LUMBAR SPINE - 1 VIEW COMPARISON:  None. FINDINGS: Intraoperative cross-table lateral portable radiograph of the lumbar spine provided. Posterior tissue retractors. Blunt tipped metallic instrument located posteriorly projecting just posterior to the inferior articulating facet of L3.  Degenerative disc disease with disc height loss throughout the lumbar spine. Facet arthropathy throughout the lumbar spine. IMPRESSION: Intraoperative localization as described above. Electronically Signed: By: Kathreen Devoid On: 07/13/2016 08:48    Assessment/Plan: The patient is doing well. He may go home tomorrow.  LOS: 0 days     Haley Fuerstenberg D 07/13/2016, 4:36 PM

## 2016-07-13 NOTE — Transfer of Care (Signed)
Immediate Anesthesia Transfer of Care Note  Patient: [name]  Procedure(s) Performed: Procedure(s): LUMBAR LAMINECTOMY AND FORAMINOTOMY Lumbar two, three, Lumbar three-four, Lumbar four-five, LEFT Lumbar five-Sacral one DISECTOMY (Left)  Patient Location: PACU  Anesthesia Type:General  Level of Consciousness: awake, alert , oriented and sedated  Airway & Oxygen Therapy: Patient Spontanous Breathing and Patient connected to nasal cannula oxygen  Post-op Assessment: Report given to RN, Post -op Vital signs reviewed and stable and Patient moving all extremities X 4  Post vital signs: Reviewed and stable  Last Vitals:  Vitals:   07/13/16 0623  BP: 124/66  Pulse: (!) 59  Resp: 20  Temp: 36.9 C    Last Pain:  Vitals:   07/13/16 0623  TempSrc: Oral  PainSc:       Patients Stated Pain Goal: 3 ([id number])  Complications: No apparent anesthesia complications

## 2016-07-13 NOTE — Progress Notes (Signed)
Patient ID: [name], male   DOB: [birth year], 80 y.o.   MRN: [id number] Subjective:  I saw the patient in the PACU. He was somnolent but arousable. He looks well.  Objective: Vital signs in last 24 hours: Temp:  [97.5 F (36.4 C)-98.5 F (36.9 C)] 97.6 F (36.4 C) (10/23 1146) Pulse Rate:  [59-70] 61 (10/23 1146) Resp:  [15-20] 18 (10/23 1146) BP: (107-135)/(50-66) 127/65 (10/23 1146) SpO2:  [94 %-97 %] 95 % (10/23 1146) Weight:  [87.1 kg (192 lb)] 87.1 kg (192 lb) (10/23 [id number])  Intake/Output from previous day: No intake/output data recorded. Intake/Output this shift: Total I/O In: 1900 [I.V.:1800; Other:100] Out: 250 [Blood:250]  Physical exam the patient is somnolent but arousable. He is moving his lower extremities well.  Lab Results: No results for input(s): WBC, HGB, HCT, PLT in the last 72 hours. BMET No results for input(s): NA, K, CL, CO2, GLUCOSE, BUN, CREATININE, CALCIUM in the last 72 hours.  Studies/Results: Dg Lumbar Spine 1 View  Addendum Date: 07/13/2016   ADDENDUM REPORT: 07/13/2016 08:59 ADDENDUM: After initial interpretation of the first image, a second image was appended to this exam. Image number 2: Cross-table lateral portable rib x-ray of the lumbar spine. Posterior tissue retractors. Blunt tip metallic probe posteriorly with the tip projecting over the inferior articulating facet of L4. Degenerate disc disease with disc height loss and bilateral facet arthropathy throughout the lumbar spine. Electronically Signed   By: Kathreen Devoid   On: 07/13/2016 08:59   Result Date: 07/13/2016 CLINICAL DATA:  Lumbar laminectomy and foraminectomy EXAM: LUMBAR SPINE - 1 VIEW COMPARISON:  None. FINDINGS: Intraoperative cross-table lateral portable radiograph of the lumbar spine provided. Posterior tissue retractors. Blunt tipped metallic instrument located posteriorly projecting just posterior to the inferior articulating facet of L3. Degenerative disc disease with disc  height loss throughout the lumbar spine. Facet arthropathy throughout the lumbar spine. IMPRESSION: Intraoperative localization as described above. Electronically Signed: By: Kathreen Devoid On: 07/13/2016 08:48    Assessment/Plan: The patient is doing well. I spoke with his family.  LOS: 0 days     Armin Yerger D 07/13/2016, 12:25 PM

## 2016-07-13 NOTE — Anesthesia Procedure Notes (Signed)
Procedure Name: Intubation Date/Time: 07/13/2016 7:39 AM Performed by: Gaylene Brooks Pre-anesthesia Checklist: Patient identified, Emergency Drugs available, Suction available and Patient being monitored Patient Re-evaluated:Patient Re-evaluated prior to inductionOxygen Delivery Method: Circle System Utilized Preoxygenation: Pre-oxygenation with 100% oxygen Intubation Type: IV induction Ventilation: Mask ventilation without difficulty Laryngoscope Size: Miller and 2 Grade View: Grade II Tube type: Oral Number of attempts: 1 Airway Equipment and Method: Stylet and Oral airway Placement Confirmation: ETT inserted through vocal cords under direct vision,  positive ETCO2 and breath sounds checked- equal and bilateral Secured at: 23 cm Tube secured with: Tape Dental Injury: Teeth and Oropharynx as per pre-operative assessment

## 2016-07-13 NOTE — H&P (Signed)
Subjective: The patient is a 80 year old white male who has complained of back and leg pain consistent with neurogenic claudication. He has failed medical management and was worked up with a lumbar MRI. This demonstrated spinal stenosis and a lumbar herniated disc. I discussed the various treatment options with the patient. He has decided to proceed with surgery.   Past Medical History:  Diagnosis Date  . Arthritis   . BPH (benign prostatic hypertrophy)   . Carotid artery disease ([facility])    Doppler, December 08, 2011, 00 [id number] R. ICA, [id number] LICA, followup 1 year  . Coronary artery disease    DES Circumflex or CVA 2006  /  clear, February, 2011, EF 70%, no ischemia or  . Ejection fraction    EF 60%, echo, 2009, mildly calcified aortic leaflets  . History of colonic polyps   . Hyperlipidemia   . Hypertension   . Hypothyroidism   . Lumbar radiculopathy   . Multiple thyroid nodules    Avascular echogenic areas noted in the right thyroid at the time of carotid Doppler.   Marland Kitchen PONV (postoperative nausea and vomiting)    "nausea with first hip surgery"  . Skin cancer (melanoma) ([facility])    PMH of   . Spinal stenosis, lumbar   . Tremor    Fine tremor right upper extremity  . Venous insufficiency     Past Surgical History:  Procedure Laterality Date  . COLONOSCOPY    . CORONARY ANGIOPLASTY WITH STENT PLACEMENT  2006   x 2 stents; DES to CX and dRCA '06  . KNEE SURGERY  1970's   "chipped bone"  . TOE SURGERY  1997  . TONSILLECTOMY    . TOTAL HIP ARTHROPLASTY Left 2007  . TOTAL HIP ARTHROPLASTY Right 2010  . TOTAL KNEE ARTHROPLASTY Right 06/25/2014   Procedure: RIGHT TOTAL KNEE ARTHROPLASTY;  Surgeon: Gearlean Alf, MD;  Location: [facility];  Service: Orthopedics;  Laterality: Right;    Allergies  Allergen Reactions  . Amlodipine Besylate Other (See Comments)    REACTION: edema    Social History  Substance Use Topics  . Smoking status: Former Smoker    Types: Cigarettes    Quit date:  09/21/1984  . Smokeless tobacco: Never Used     Comment: smoked 1958-1986, up to 1 ppd  . Alcohol use Yes     Comment: socially on occasion    Family History  Problem Relation Age of Onset  . Coronary artery disease Mother     carotid endarterectomy bilaterally  . Colon cancer Father 51  . Hypertension Father   . Pancreatitis Father   . Diabetes Sister   . Diabetes Maternal Grandfather   . Heart attack Maternal Grandfather 70  . Heart attack Sister 24  . Thyroid disease Neg Hx   . Stroke Neg Hx   . Prostate cancer Neg Hx    Prior to Admission medications   Medication Sig Start Date End Date Taking? Authorizing Provider  aspirin 325 MG tablet Take 1 tablet (325 mg total) by mouth daily. Patient taking differently: Take 325 mg by mouth at bedtime.  01/09/15  Yes Carlena Bjornstad, MD  atorvastatin (LIPITOR) 40 MG tablet Take 0.5 tablets (20 mg total) by mouth at bedtime. 05/04/16  Yes Colon Branch, MD  baclofen (LIORESAL) 10 MG tablet Take 10 mg by mouth 3 (three) times daily. 01/14/16  Yes Historical Provider, MD  benazepril (LOTENSIN) 20 MG tablet Take 1.5 tablets (30 mg total) by  mouth every morning. 05/04/16  Yes Colon Branch, MD  diclofenac (VOLTAREN) 50 MG EC tablet Take 50 mg by mouth 2 (two) times daily.    Yes Historical Provider, MD  diltiazem (DILACOR XR) 180 MG 24 hr capsule Take 1 capsule (180 mg total) by mouth every morning. 05/04/16  Yes Colon Branch, MD  furosemide (LASIX) 40 MG tablet Take 1 tablet (40 mg total) by mouth daily. 05/04/16  Yes Colon Branch, MD  levothyroxine (SYNTHROID, LEVOTHROID) 200 MCG tablet Take 1 tablet (200 mcg total) by mouth daily before breakfast. 06/04/16  Yes Colon Branch, MD  LUMIGAN 0.01 % SOLN Place 1 drop into both eyes at bedtime. 04/13/16  Yes Historical Provider, MD  metoprolol tartrate (LOPRESSOR) 25 MG tablet Take 0.5 tablets (12.5 mg total) by mouth 2 (two) times daily. 05/04/16  Yes Colon Branch, MD  nitroGLYCERIN (NITROSTAT) 0.4 MG SL tablet Place 1  tablet (0.4 mg total) under the tongue every 5 (five) minutes as needed. 01/09/15  Yes Carlena Bjornstad, MD  amoxicillin (AMOXIL) 500 MG tablet Take 2,000 mg by mouth every hour as needed (For dental appts).    Historical Provider, MD  metoprolol tartrate (LOPRESSOR) 25 MG tablet TAKE 1/2 TABLET(12.5 MG) BY MOUTH TWICE DAILY 06/29/16   Colon Branch, MD     Review of Systems  Positive ROS: As above  All other systems have been reviewed and were otherwise negative with the exception of those mentioned in the HPI and as above.  Objective: Vital signs in last 24 hours: Temp:  [98.5 F (36.9 C)] 98.5 F (36.9 C) (10/23 [id number]) Pulse Rate:  [59] 59 (10/23 0623) Resp:  [20] 20 (10/23 0623) BP: (124)/(66) 124/66 (10/23 0623) SpO2:  [96 %] 96 % (10/23 0623) Weight:  [87.1 kg (192 lb)] 87.1 kg (192 lb) (10/23 [id number])  General Appearance: Alert, cooperative, no distress, Head: Normocephalic, without obvious abnormality, atraumatic Eyes: PERRL, conjunctiva/corneas clear, EOM's intact,    Ears: Normal  Throat: Normal  Neck: Supple, symmetrical, trachea midline, no adenopathy; thyroid: No enlargement/tenderness/nodules; no carotid bruit or JVD Back: Symmetric, no curvature, ROM normal, no CVA tenderness Lungs: Clear to auscultation bilaterally, respirations unlabored Heart: Regular rate and rhythm, no murmur, rub or gallop Abdomen: Soft, non-tender,, no masses, no organomegaly Extremities: Extremities normal, atraumatic, no cyanosis or edema Pulses: 2+ and symmetric all extremities Skin: Skin color, texture, turgor normal, no rashes or lesions  NEUROLOGIC:   Mental status: alert and oriented, no aphasia, good attention span, Fund of knowledge/ memory ok Motor Exam - grossly normal Sensory Exam - grossly normal Reflexes:  Coordination - grossly normal Gait - grossly normal Balance - grossly normal Cranial Nerves: I: smell Not tested  II: visual acuity  OS: Normal  OD: Normal   II: visual  fields Full to confrontation  II: pupils Equal, round, reactive to light  III,VII: ptosis None  III,IV,VI: extraocular muscles  Full ROM  V: mastication Normal  V: facial light touch sensation  Normal  V,VII: corneal reflex  Present  VII: facial muscle function - upper  Normal  VII: facial muscle function - lower Normal  VIII: hearing Not tested  IX: soft palate elevation  Normal  IX,X: gag reflex Present  XI: trapezius strength  5/5  XI: sternocleidomastoid strength 5/5  XI: neck flexion strength  5/5  XII: tongue strength  Normal    Data Review Lab Results  Component Value Date   WBC 8.8 07/02/2016  HGB 15.5 07/02/2016   HCT 46.4 07/02/2016   MCV 91.2 07/02/2016   PLT 244 07/02/2016   Lab Results  Component Value Date   NA 141 07/02/2016   K 4.1 07/02/2016   CL 109 07/02/2016   CO2 26 07/02/2016   BUN 25 (H) 07/02/2016   CREATININE 1.08 07/02/2016   GLUCOSE 73 07/02/2016   Lab Results  Component Value Date   INR 1.02 06/18/2014    Assessment/Plan: L2-3, L3-4 and L4-5 spinal stenosis, left L5-S1 herniated disc, lumbago, lumbar radiculopathy: I have discussed the situation with the patient and reviewed his imaging studies with him. We have discussed the various treatment options including surgery. I have described the surgical treatment option of a L2-3, L3-4 and L4-5 laminectomy with a left L5-S1 discectomy. I have shown him surgical models. We have discussed the risks, benefits, alternatives, likelihood of achieving her goals with surgery, and expected postoperative course. I have answered all his questions. He has decided to proceed with surgery.   Brien Lowe D 07/13/2016 7:26 AM

## 2016-07-13 NOTE — Anesthesia Postprocedure Evaluation (Signed)
Anesthesia Post Note  Patient: [name]  Procedure(s) Performed: Procedure(s) (LRB): LUMBAR LAMINECTOMY AND FORAMINOTOMY Lumbar two, three, Lumbar three-four, Lumbar four-five, LEFT Lumbar five-Sacral one DISECTOMY (Left)  Patient location during evaluation: PACU Anesthesia Type: General Level of consciousness: awake and alert Pain management: pain level controlled Vital Signs Assessment: post-procedure vital signs reviewed and stable Respiratory status: spontaneous breathing, nonlabored ventilation, respiratory function stable and patient connected to nasal cannula oxygen Cardiovascular status: blood pressure returned to baseline and stable Postop Assessment: no signs of nausea or vomiting Anesthetic complications: no    Last Vitals:  Vitals:   07/13/16 1100 07/13/16 1115  BP: 128/63 (!) 117/57  Pulse: 64 62  Resp: 19 17  Temp:      Last Pain:  Vitals:   07/13/16 1115  TempSrc:   PainSc: 3                  Zenaida Deed

## 2016-07-13 NOTE — Op Note (Signed)
Brief history: The patient is a 80 year old white male who has complained of back and leg pain. He has failed medical management and was worked up with a lumbar MRI. This demonstrated multilevel degenerative changes, stenosis, herniated disc, etc. I discussed the various treatment options with the patient. He has decided to proceed with surgery after weighing the risks, benefits, and alternatives.  Preoperative diagnosis: L2-3, L3-4, L4-5 spinal stenosis, left L5-S1 herniated disk, lumbago, lumbar radiculopathy, neurogenic claudication  Postoperative diagnosis: As above  Procedure: L2-3, L3-4, L4-5 laminectomy to decompress bilateral L2, L3, L4 and L5 nerve roots using microdissection; left L5-S1 Intervertebral discectomy using micro-dissection  Surgeon: Dr. Earle Gell  Asst.: Dr. Annette Stable  Anesthesia: Gen. endotracheal  Estimated blood loss: 150 mL  Drains: None  Complications: None  Description of procedure: The patient was brought to the operating room by the anesthesia team. General endotracheal anesthesia was induced. The patient was turned to the prone position on the Wilson frame. The patient's lumbosacral region was then prepared with Betadine scrub and Betadine solution. Sterile drapes were applied.  I then injected the area to be incised with Marcaine with epinephrine solution. I then used a scalpel to make a linear midline incision over the L2-3, L3-4, L4-5 and L5-S1 intervertebral disc space. I then used electrocautery to perform a bilateral subperiosteal dissection exposing the spinous process and lamina of L2 to the upper sacrum. We obtained intraoperative radiograph to confirm our location. I then inserted the [facility] retractor for exposure. I used a scalpel to incise the interspinous ligament at L2-3, L3-4 and L4-5. I used a Leksell rongeur to remove the L3, and L4 spinous process.  We then brought the operative microscope into the field. Under its magnification and  illumination we completed the microdissection. I used a high-speed drill to perform a laminotomy at L2-3, L3-4 and L4-5 bilaterally as well as at the left at  L5-S1. I then used a Kerrison punches to complete the L3 and L4 laminectomy and to widen the bilateral laminotomies at L2-3 , L3-4 and L4-5 and removed the ligamentum flavum at L2-3, L3-4 and L4-5 bilaterally and at L5-S1 on the left. We then used microdissection to free up the thecal sac and the bilateral L2, L3, L4 and L5 nerve root and the left S1 from the epidural tissue. I then used a Kerrison punch to perform a foraminotomy at about the bilateral L2, L3, L4 and L5 and at S1 on the left nerve root. We inspected the intervertebral disc at L2-3, L3-4 and L4-5 bilaterally. This was bulging but there were no herniations. At L5-S1 the left there was a bulging calcified disk bulge which we did not remove as it did not seem to be causing  significant neural compression.  I then palpated along the ventral surface of the thecal sac and along exit route of the bilateral L2, L3, L4 and L5 and left S1 nerve root and noted that the neural structures were well decompressed. This completed the decompression.  We then obtained hemostasis using bipolar electrocautery. We irrigated the wound out with bacitracin solution. We then removed the retractor. We then reapproximated the patient's thoracolumbar fascia with interrupted #1 Vicryl suture. We then reapproximated the patient's subcutaneous tissue with interrupted 2-0 Vicryl suture. We then reapproximated patient's skin with Steri-Strips and benzoin. The was then coated with bacitracin ointment. The drapes were removed. The patient was subsequently returned to the supine position where they were extubated by the anesthesia team. The patient was then transported  to the postanesthesia care unit in stable condition. All sponge instrument and needle counts were reportedly correct at the end of this case.

## 2016-07-13 NOTE — Anesthesia Preprocedure Evaluation (Signed)
Anesthesia Evaluation  Patient identified by MRN, date of birth, ID band Patient awake    Reviewed: Allergy & Precautions, NPO status , Patient's Chart, lab work & pertinent test results  Airway Mallampati: II  TM Distance: >3 FB Neck ROM: Full    Dental  (+) Teeth Intact, Dental Advisory Given   Pulmonary former smoker,    breath sounds clear to auscultation       Cardiovascular hypertension,  Rhythm:Regular Rate:Normal     Neuro/Psych    GI/Hepatic   Endo/Other    Renal/GU      Musculoskeletal   Abdominal   Peds  Hematology   Anesthesia Other Findings   Reproductive/Obstetrics                             Anesthesia Physical Anesthesia Plan  ASA: III  Anesthesia Plan: Regional   Post-op Pain Management:    Induction: Intravenous  Airway Management Planned: Oral ETT  Additional Equipment:   Intra-op Plan:   Post-operative Plan: Extubation in OR  Informed Consent: I have reviewed the patients History and Physical, chart, labs and discussed the procedure including the risks, benefits and alternatives for the proposed anesthesia with the patient or authorized representative who has indicated his/her understanding and acceptance.   Dental advisory given  Plan Discussed with: CRNA and Anesthesiologist  Anesthesia Plan Comments: (Lumbar spinal stenosis CAD S/P DES 2006, no ischemia by nuclear stress test 7/17 Hypertension H/O post-op N/V  Plan GA with oral ETT  Roberts Gaudy)        Anesthesia Quick Evaluation

## 2016-07-13 NOTE — Evaluation (Signed)
Physical Therapy Evaluation Patient Details Name: [name] MRN: [id number] DOB: November 20, 1935 Today's Date: 07/13/2016   History of Present Illness  Pt is a 80 y/o male who presents s/p L2-L5 laminectomy and L L5-S1 intervertebral discectomy.   Clinical Impression  Pt admitted with above diagnosis. Pt currently with functional limitations due to the deficits listed below (see PT Problem List). At the time of PT eval pt was able to perform transfers and ambulation with close guard to min assist for balance support and safety. Pt will benefit from skilled PT to increase their independence and safety with mobility to allow discharge to the venue listed below.       Follow Up Recommendations Home health PT;Supervision for mobility/OOB    Equipment Recommendations  None recommended by PT    Recommendations for Other Services       Precautions / Restrictions Precautions Precautions: Back;Fall Precaution Booklet Issued: Yes (comment) Precaution Comments: Reviewed handout and cued pt for maintenance of precautions during functional mobility.  Restrictions Weight Bearing Restrictions: No      Mobility  Bed Mobility Overal bed mobility: Needs Assistance Bed Mobility: Rolling;Sidelying to Sit Rolling: Supervision Sidelying to sit: Supervision       General bed mobility comments: VC's for general maintenance of back precautions  Transfers Overall transfer level: Needs assistance Equipment used: 1 person hand held assist Transfers: Sit to/from Stand Sit to Stand: Min guard         General transfer comment: Close guard for safety. Increased time required.   Ambulation/Gait Ambulation/Gait assistance: Min assist Ambulation Distance (Feet): 150 Feet Assistive device: 1 person hand held assist (Held to railing with other hand) Gait Pattern/deviations: Step-through pattern;Decreased stride length;Trunk flexed Gait velocity: Decreased Gait velocity interpretation: Below  normal speed for age/gender General Gait Details: VC's for improved posture. Pt occasionally unsteady and required min assist to recover.   Stairs            Wheelchair Mobility    Modified Rankin (Stroke Patients Only)       Balance Overall balance assessment: Needs assistance Sitting-balance support: Feet supported;No upper extremity supported Sitting balance-Leahy Scale: Fair     Standing balance support: No upper extremity supported;During functional activity Standing balance-Leahy Scale: Fair Standing balance comment: Sway noted in static standing                             Pertinent Vitals/Pain Pain Assessment: 0-10 Pain Score: 2  Pain Location: Incision site Pain Descriptors / Indicators: Operative site guarding Pain Intervention(s): Limited activity within patient's tolerance;Monitored during session;Repositioned    Home Living Family/patient expects to be discharged to:: Private residence Living Arrangements: Spouse/significant other Available Help at Discharge: Family;Available 24 hours/day Type of Home: House Home Access: Stairs to enter Entrance Stairs-Rails: Right;Left;Can reach both Entrance Stairs-Number of Steps: 3 Home Layout: One level Home Equipment: Walker - 2 wheels;Bedside commode      Prior Function Level of Independence: Independent               Hand Dominance   Dominant Hand: Right    Extremity/Trunk Assessment   Upper Extremity Assessment: Defer to OT evaluation           Lower Extremity Assessment: Generalized weakness;RLE deficits/detail RLE Deficits / Details: External rotation of the RLE due to pain prior to surgery.    Cervical / Trunk Assessment: Other exceptions  Communication   Communication: No difficulties  Cognition  Arousal/Alertness: Awake/alert Behavior During Therapy: WFL for tasks assessed/performed Overall Cognitive Status: Within Functional Limits for tasks assessed                       General Comments      Exercises     Assessment/Plan    PT Assessment Patient needs continued PT services  PT Problem List Decreased strength;Decreased activity tolerance;Decreased range of motion;Decreased balance;Decreased mobility;Decreased knowledge of use of DME;Decreased safety awareness;Decreased knowledge of precautions;Pain          PT Treatment Interventions DME instruction;Gait training;Stair training;Functional mobility training;Therapeutic activities;Therapeutic exercise;Neuromuscular re-education;Patient/family education    PT Goals (Current goals can be found in the Care Plan section)  Acute Rehab PT Goals Patient Stated Goal: Home at d/c PT Goal Formulation: With patient/family Time For Goal Achievement: 07/20/16 Potential to Achieve Goals: Good    Frequency Min 5X/week   Barriers to discharge        Co-evaluation               End of Session Equipment Utilized During Treatment: Gait belt Activity Tolerance: Patient tolerated treatment well Patient left: with call bell/phone within reach;with family/visitor present (Sitting EOB) Nurse Communication: Mobility status         Time: [id number] PT Time Calculation (min) (ACUTE ONLY): 32 min   Charges:   PT Evaluation $PT Eval Moderate Complexity: 1 Procedure PT Treatments $Gait Training: 8-22 mins   PT G Codes:        Rolinda Roan 08-06-2016, 2:45 PM   Rolinda Roan, PT, DPT Acute Rehabilitation Services Pager: [phone]

## 2016-07-14 ENCOUNTER — Encounter (HOSPITAL_COMMUNITY): Payer: Self-pay | Admitting: Neurosurgery

## 2016-07-14 DIAGNOSIS — Z23 Encounter for immunization: Secondary | ICD-10-CM | POA: Diagnosis not present

## 2016-07-14 MED ORDER — TAMSULOSIN HCL 0.4 MG PO CAPS: 0.4000 mg | ORAL_CAPSULE | Freq: Every day | ORAL | 0 refills | Status: DC

## 2016-07-14 MED ORDER — HYDROCODONE-ACETAMINOPHEN 5-325 MG PO TABS: 1.0000 | ORAL_TABLET | ORAL | 0 refills | Status: DC | PRN

## 2016-07-14 MED ORDER — DOCUSATE SODIUM 100 MG PO CAPS: 100.0000 mg | ORAL_CAPSULE | Freq: Two times a day (BID) | ORAL | 0 refills | Status: DC

## 2016-07-14 NOTE — Discharge Summary (Signed)
Physician Discharge Summary  Patient ID: [name] MRN: [id number] DOB/AGE: [birth year] 80 y.o.  Admit date: 07/13/2016 Discharge date: 07/14/2016  Admission Diagnoses:Lumbar spinal stenosis, lumbago, lumbar radiculopathy, neurogenic claudication  Discharge Diagnoses: The same Active Problems:   Lumbar stenosis with neurogenic claudication   Discharged Condition: good  Hospital Course: I performed an L2-3, L3-4, L4-5 and L5-S1 laminectomy/laminotomy on the patient on 07/13/2016. The surgery went well.  The patient's postoperative course was remarkable only for some urinary retention. On postop day #1 and this resolved. The patient requested discharge to home. The plan is to send him home later on if he continues to urinate normally. The patient, and his daughter, were given written and oral discharge instructions. All their questions were answered.  Consults: Physical therapy Significant Diagnostic Studies: None Treatments: L2-3, L3-4, L4-5 and L5-S1 laminectomy/laminotomy using microdissection Discharge Exam: Blood pressure 113/61, pulse 71, temperature 98.4 F (36.9 C), temperature source Oral, resp. rate 18, height 5\' 9"  (1.753 m), weight 87.1 kg (192 lb), SpO2 97 %. The patient is alert and pleasant. His strength is grossly normal in his lower extremities. He looks well.  Disposition: Home     Medication List    TAKE these medications   amoxicillin 500 MG tablet Commonly known as:  AMOXIL Take 2,000 mg by mouth every hour as needed (For dental appts).   aspirin 325 MG tablet Take 1 tablet (325 mg total) by mouth daily. What changed:  when to take this   atorvastatin 40 MG tablet Commonly known as:  LIPITOR Take 0.5 tablets (20 mg total) by mouth at bedtime.   baclofen 10 MG tablet Commonly known as:  LIORESAL Take 10 mg by mouth 3 (three) times daily.   benazepril 20 MG tablet Commonly known as:  LOTENSIN Take 1.5 tablets (30 mg total) by mouth every  morning.   diclofenac 50 MG EC tablet Commonly known as:  VOLTAREN Take 50 mg by mouth 2 (two) times daily.   diltiazem 180 MG 24 hr capsule Commonly known as:  DILACOR XR Take 1 capsule (180 mg total) by mouth every morning.   docusate sodium 100 MG capsule Commonly known as:  COLACE Take 1 capsule (100 mg total) by mouth 2 (two) times daily.   furosemide 40 MG tablet Commonly known as:  LASIX Take 1 tablet (40 mg total) by mouth daily.   HYDROcodone-acetaminophen 5-325 MG tablet Commonly known as:  NORCO/VICODIN Take 1-2 tablets by mouth every 4 (four) hours as needed for moderate pain.   levothyroxine 200 MCG tablet Commonly known as:  SYNTHROID, LEVOTHROID Take 1 tablet (200 mcg total) by mouth daily before breakfast.   LUMIGAN 0.01 % Soln Generic drug:  bimatoprost Place 1 drop into both eyes at bedtime.   metoprolol tartrate 25 MG tablet Commonly known as:  LOPRESSOR Take 0.5 tablets (12.5 mg total) by mouth 2 (two) times daily. What changed:  Another medication with the same name was removed. Continue taking this medication, and follow the directions you see here.   nitroGLYCERIN 0.4 MG SL tablet Commonly known as:  NITROSTAT Place 1 tablet (0.4 mg total) under the tongue every 5 (five) minutes as needed.   tamsulosin 0.4 MG Caps capsule Commonly known as:  FLOMAX Take 1 capsule (0.4 mg total) by mouth daily. Start taking on:  07/15/2016        Signed: Ophelia Charter 07/14/2016, 7:52 AM

## 2016-07-14 NOTE — Discharge Instructions (Signed)

## 2016-07-14 NOTE — Progress Notes (Signed)
Pt doing well. Pt and wife given D/C instructions with Rx's, verbal understanding was provided. Pt's incision is clean and dry with no sign of infection. Pt's IV was removed prior to D/C. Pt D/C'd home via wheelchair @ 1425 per MD order. Pt is stable @ D/C and has no other needs at this time. Holli Humbles, RN

## 2016-07-14 NOTE — Progress Notes (Signed)
Physical Therapy Treatment Patient Details Name: [name] MRN: [id number] DOB: 1935/09/25 Today's Date: 07/14/2016    History of Present Illness Pt is a 80 y/o male who presents s/p L2-L5 laminectomy and L L5-S1 intervertebral discectomy.     PT Comments    Pt progressing towards physical therapy goals. Was able to improve gait distance and completed stair training. Pt was educated on general safety at home as well as energy conservation techniques.   Follow Up Recommendations  Outpatient PT;Supervision for mobility/OOB     Equipment Recommendations  None recommended by PT    Recommendations for Other Services       Precautions / Restrictions Precautions Precautions: Back;Fall Precaution Booklet Issued: Yes (comment) Precaution Comments: Reviewed handout and cued pt for maintenance of precautions during functional mobility.  Restrictions Weight Bearing Restrictions: No    Mobility  Bed Mobility               General bed mobility comments: Pt sitting up on EOB when PT arrived.   Transfers Overall transfer level: Needs assistance Equipment used: 1 person hand held assist Transfers: Sit to/from Stand Sit to Stand: Supervision         General transfer comment: Close supervision for safety. Increased time and cues for precautions required.   Ambulation/Gait Ambulation/Gait assistance: Supervision Ambulation Distance (Feet): 400 Feet Assistive device: None (Held to railing occasionally) Gait Pattern/deviations: Step-through pattern;Decreased stride length;Trendelenburg Gait velocity: Decreased Gait velocity interpretation: Below normal speed for age/gender General Gait Details: VC's for improved posture. No assist required however close supervision provided for safety.    Stairs Stairs: Yes Stairs assistance: Min guard Stair Management: One rail Left;Step to pattern;Forwards Number of Stairs: 4 General stair comments: VC's for sequencing and  safety  Wheelchair Mobility    Modified Rankin (Stroke Patients Only)       Balance Overall balance assessment: Needs assistance Sitting-balance support: Feet supported;No upper extremity supported Sitting balance-Leahy Scale: Good     Standing balance support: No upper extremity supported Standing balance-Leahy Scale: Fair                      Cognition Arousal/Alertness: Awake/alert Behavior During Therapy: WFL for tasks assessed/performed Overall Cognitive Status: Within Functional Limits for tasks assessed                      Exercises      General Comments        Pertinent Vitals/Pain Pain Assessment: Faces Faces Pain Scale: Hurts a little bit Pain Location: Incision site Pain Descriptors / Indicators: Operative site guarding Pain Intervention(s): Limited activity within patient's tolerance;Monitored during session;Repositioned    Home Living                      Prior Function            PT Goals (current goals can now be found in the care plan section) Acute Rehab PT Goals Patient Stated Goal: Home at d/c PT Goal Formulation: With patient/family Time For Goal Achievement: 07/20/16 Potential to Achieve Goals: Good Progress towards PT goals: Progressing toward goals    Frequency    Min 5X/week      PT Plan Discharge plan needs to be updated    Co-evaluation             End of Session Equipment Utilized During Treatment: Gait belt Activity Tolerance: Patient tolerated treatment well Patient left: with call  bell/phone within reach (Sitting EOB)     Time: [id number] PT Time Calculation (min) (ACUTE ONLY): 24 min  Charges:  $Gait Training: 23-37 mins                    G Codes:      Rolinda Roan 08-09-2016, 9:19 AM  Rolinda Roan, PT, DPT Acute Rehabilitation Services Pager: [phone]

## 2016-08-10 NOTE — Progress Notes (Signed)
Patient ID: [name], male   DOB: October 02, 1935, 80 y.o.   MRN: [id number]    Cardiology Office Note   Date:  08/12/2016   ID:  [name], DOB November 19, 1935, MRN [id number]  PCP:  [name], MD  Cardiologist:  Jenkins Rouge, MD   Chief Complaint  Patient presents with  . Bilateral carotid artery stenosis  . Coronary Artery Disease      History of Present Illness: [name] is a 80 y.o. male previously seen by Dr Ron Parker   History of CAD . DES to circumflex 2006 He had a nuclear scan in 2011 that was non ischemic. Marland Kitchen He is not having any symptoms. His last carotid Doppler was .01/10/16   With plaque no stenosis no f/u for 2 years needed He is not having any chest pain or shortness of breath.  CRF;s HTN and elevated lipids  Has had right hamstring issues for close to 2 years after right knee replacement Has also had both hips replaced   Use to fly planes Compliant with meds   Myovue 03/31/16:  EF 62% normal no ischemia or infarct  07/13/16 Had extensive lumbar laminectomy by Dr Arnoldo Morale with no cardiac complications  Having LE edema right worse than left   Past Medical History:  Diagnosis Date  . Arthritis   . BPH (benign prostatic hypertrophy)   . Carotid artery disease ([place])    Doppler, December 08, 2011, 00 [id number] R. ICA, [id number] LICA, followup 1 year  . Coronary artery disease    DES Circumflex or CVA 2006  /  clear, February, 2011, EF 70%, no ischemia or  . Ejection fraction    EF 60%, echo, 2009, mildly calcified aortic leaflets  . History of colonic polyps   . Hyperlipidemia   . Hypertension   . Hypothyroidism   . Lumbar radiculopathy   . Multiple thyroid nodules    Avascular echogenic areas noted in the right thyroid at the time of carotid Doppler.   Marland Kitchen PONV (postoperative nausea and vomiting)    "nausea with first hip surgery"  . Skin cancer (melanoma) ([facility])    PMH of   . Spinal stenosis, lumbar   . Tremor    Fine tremor right upper extremity  . Venous insufficiency      Past Surgical History:  Procedure Laterality Date  . COLONOSCOPY    . CORONARY ANGIOPLASTY WITH STENT PLACEMENT  2006   x 2 stents; DES to CX and dRCA '06  . KNEE SURGERY  1970's   "chipped bone"  . LUMBAR LAMINECTOMY/DECOMPRESSION MICRODISCECTOMY Left 07/13/2016   Procedure: LUMBAR LAMINECTOMY AND FORAMINOTOMY Lumbar two, three, Lumbar three-four, Lumbar four-five, LEFT Lumbar five-Sacral one DISECTOMY;  Surgeon: Newman Pies, MD;  Location: [facility];  Service: Neurosurgery;  Laterality: Left;  . TOE SURGERY  1997  . TONSILLECTOMY    . TOTAL HIP ARTHROPLASTY Left 2007  . TOTAL HIP ARTHROPLASTY Right 2010  . TOTAL KNEE ARTHROPLASTY Right 06/25/2014   Procedure: RIGHT TOTAL KNEE ARTHROPLASTY;  Surgeon: Gearlean Alf, MD;  Location: [facility];  Service: Orthopedics;  Laterality: Right;    Patient Active Problem List   Diagnosis Date Noted  . Lumbar stenosis with neurogenic claudication 07/13/2016  . Annual physical exam>>>>>>>>>>>>>>>>>> 05/04/2016  . PCP NOTES>>>>>>>>>>>>>>>>>>>>>>>>>>> 12/31/2015  . Edema 02/19/2015  . OA (osteoarthritis) of knee 06/25/2014  . Unspecified sleep apnea 06/18/2014  . Multiple thyroid nodules   . Tremor   . Carotid artery disease ([place])   .  Erectile dysfunction 05/31/2012  . Coronary artery disease   . Venous insufficiency   . Hyperglycemia 11/09/2008  . MELANOMA, HX OF 11/09/2008  . COLONIC POLYPS, HYPERPLASTIC 05/10/2008  . Hypothyroidism 08/10/2007  . HYPERLIPIDEMIA 08/10/2007  . Essential hypertension 08/10/2007  . DEGENERATIVE JOINT DISEASE 08/08/2007  . MURMUR 08/08/2007  . BENIGN PROSTATIC HYPERTROPHY, HX OF 08/05/2007      Current Outpatient Prescriptions  Medication Sig Dispense Refill  . amoxicillin (AMOXIL) 500 MG tablet Take 2,000 mg by mouth every hour as needed (For dental appts).    Marland Kitchen aspirin 325 MG tablet Take 1 tablet (325 mg total) by mouth daily. 90 tablet 3  . atorvastatin (LIPITOR) 40 MG tablet Take 0.5 tablets (20  mg total) by mouth at bedtime. 45 tablet 3  . benazepril (LOTENSIN) 20 MG tablet Take 1.5 tablets (30 mg total) by mouth every morning. 135 tablet 3  . diltiazem (DILACOR XR) 180 MG 24 hr capsule Take 1 capsule (180 mg total) by mouth every morning. 90 capsule 3  . furosemide (LASIX) 40 MG tablet Take 1 tablet (40 mg total) by mouth daily. 90 tablet 3  . HYDROcodone-acetaminophen (NORCO/VICODIN) 5-325 MG tablet Take 1-2 tablets by mouth every 4 (four) hours as needed for moderate pain. 50 tablet 0  . levothyroxine (SYNTHROID, LEVOTHROID) 200 MCG tablet Take 1 tablet (200 mcg total) by mouth daily before breakfast. 30 tablet 5  . LUMIGAN 0.01 % SOLN Place 1 drop into both eyes at bedtime.    . metoprolol tartrate (LOPRESSOR) 25 MG tablet Take 0.5 tablets (12.5 mg total) by mouth 2 (two) times daily. 90 tablet 3  . nitroGLYCERIN (NITROSTAT) 0.4 MG SL tablet Place 1 tablet (0.4 mg total) under the tongue every 5 (five) minutes as needed. 25 tablet 11  . tamsulosin (FLOMAX) 0.4 MG CAPS capsule Take 1 capsule (0.4 mg total) by mouth daily. 30 capsule 0   No current facility-administered medications for this visit.     Allergies:   Amlodipine besylate    Social History:  The patient  reports that he quit smoking about 31 years ago. His smoking use included Cigarettes. He has never used smokeless tobacco. He reports that he drinks alcohol. He reports that he does not use drugs.   Family History:  The patient's family history includes Colon cancer (age of onset: 77) in his father; Coronary artery disease in his mother; Diabetes in his maternal grandfather and sister; Heart attack (age of onset: 7) in his sister; Heart attack (age of onset: 16) in his maternal grandfather; Hypertension in his father; Pancreatitis in his father.    ROS:  Please see the history of present illness.      Patient denies fever, chills, headache, sweats, rash, change in vision, change in hearing, chest pain, cough, nausea or  vomiting, urinary symptoms. All of the systems are reviewed and are negative.   PHYSICAL EXAM: VS:  BP 120/60   Pulse 69   Ht 5\' 9"  (1.753 m)   Wt 87.6 kg (193 lb 3.2 oz)   SpO2 97%   BMI 28.53 kg/m  , Affect appropriate Healthy:  appears stated age HEENT: normal Neck supple with no adenopathy JVP normal no bruits no thyromegaly Lungs clear with no wheezing and good diaphragmatic motion Heart:  S1/S2 no murmur, no rub, gallop or click PMI normal Abdomen: benighn, BS positve, no tenderness, no AAA no bruit.  No HSM or HJR Distal pulses intact with no bruits Plus 2  edema  Neuro non-focal Skin warm and dry No muscular weakness Post R TKR    EKG:    10/2014 SR rate 69 PVC LAD   01/21/16  SR rate 82  PAC;s LAFB    Recent Labs: 01/02/2016: ALT 16 05/04/2016: TSH 0.37 07/02/2016: BUN 25; Creatinine, Ser 1.08; Hemoglobin 15.5; Platelets 244; Potassium 4.1; Sodium 141    Lipid Panel    Component Value Date/Time   CHOL 150 01/02/2016 1125   CHOL 161 10/31/2014 0824   TRIG 110.0 01/02/2016 1125   TRIG 81 10/31/2014 0824   TRIG 86 07/23/2006 1012   HDL 43.70 01/02/2016 1125   HDL 49 10/31/2014 0824   CHOLHDL 3 01/02/2016 1125   VLDL 22.0 01/02/2016 1125   LDLCALC 84 01/02/2016 1125   LDLCALC 96 10/31/2014 0824      Wt Readings from Last 3 Encounters:  08/12/16 87.6 kg (193 lb 3.2 oz)  07/13/16 87.1 kg (192 lb)  07/02/16 87.4 kg (192 lb 11.2 oz)      Current medicines are reviewed   The patient and his wife understand his medications.     ASSESSMENT AND PLAN: CAD:  DES to Circ 2006 non ischemic myovue 2011 and and 03/31/16 stable  HTN:  Well controlled.  Continue current medications and low sodium Dash type diet.   Chol:   Lab Results  Component Value Date   LDLCALC 84 01/02/2016   Thyroid:  Dose recently increased to 200 ug f/u TSH in 3 months  Lab Results  Component Value Date   TSH 0.37 05/04/2016   PAC;s :   Asymptomatic continue beta blocker Back:   Post op laminectomy with relief of leg pain f/u Dr Arnoldo Morale  Edema: dependant worse on right side with TKR increase lasix 40 am 20 mg pm BMET in 3 weeks f/u PA 6-8 weeks    Jenkins Rouge

## 2016-08-12 ENCOUNTER — Ambulatory Visit (INDEPENDENT_AMBULATORY_CARE_PROVIDER_SITE_OTHER): Payer: Medicare Other | Admitting: Cardiovascular Disease

## 2016-08-12 ENCOUNTER — Encounter: Payer: Self-pay | Admitting: Cardiovascular Disease

## 2016-08-12 VITALS — BP 120/60 | HR 69 | Ht 69.0 in | Wt 193.2 lb

## 2016-08-12 DIAGNOSIS — I25810 Atherosclerosis of coronary artery bypass graft(s) without angina pectoris: Secondary | ICD-10-CM

## 2016-08-12 DIAGNOSIS — R601 Generalized edema: Secondary | ICD-10-CM

## 2016-08-12 DIAGNOSIS — I6523 Occlusion and stenosis of bilateral carotid arteries: Secondary | ICD-10-CM

## 2016-08-12 MED ORDER — FUROSEMIDE 40 MG PO TABS: ORAL_TABLET | ORAL | 3 refills | Status: DC

## 2016-08-12 NOTE — Patient Instructions (Signed)
Medication Instructions:  1) INCREASE LASIX to 40 mg in the morning and 20 mg in the afternoon  Labwork: Your physician recommends that you return for lab work in 3 weeks (BMET)  Testing/Procedures: None  Follow-Up: Your physician recommends that you schedule a follow-up appointment in 3 weeks with Dr. Kyla Balzarine assistant (lab drawn same day).  Any Other Special Instructions Will Be Listed Below (If Applicable).     If you need a refill on your cardiac medications before your next appointment, please call your pharmacy.

## 2016-08-24 DIAGNOSIS — M542 Cervicalgia: Secondary | ICD-10-CM | POA: Diagnosis not present

## 2016-08-26 DIAGNOSIS — M542 Cervicalgia: Secondary | ICD-10-CM | POA: Diagnosis not present

## 2016-08-27 ENCOUNTER — Encounter: Payer: Self-pay | Admitting: Physician Assistant

## 2016-08-31 NOTE — Progress Notes (Signed)
Cardiology Office Note    Date:  09/02/2016   ID:  [name], DOB 07-Feb-1936, MRN [id number]  PCP:  [name], MD  Cardiologist:  Dr. Johnsie Cancel  CC: 3 week follow up   History of Present Illness:  [name] is a 80 y.o. male with a history of CAD s/p DES to LCx (2006), carotid artery disease, HTN, HLD who presents to clinic for follow up.   He recently had a normal nuclear stress test in 03/2016 prior to back surgery.   He was seen by Dr. Johnsie Cancel 08/12/16. He complained of dependant edema in RLE where he had TKA 3 years prior. His lasix was increased to 40mg  in AM and 20mg  in PM.   Today he presents to clinic for follow up. No chest pain or SOB. LE edema is a little better. No orthopnea or PND. No dizziness or syncope. He is feeling quite well. No complaints.     Past Medical History:  Diagnosis Date  . Arthritis   . BPH (benign prostatic hypertrophy)   . Carotid artery disease ([place])    Doppler, December 08, 2011, 00 [id number] R. ICA, 123456 LICA, followup 1 year  . Coronary artery disease    DES Circumflex or CVA 2006  /  clear, February, 2011, EF 70%, no ischemia or  . Ejection fraction    EF 60%, echo, 2009, mildly calcified aortic leaflets  . History of colonic polyps   . Hyperlipidemia   . Hypertension   . Hypothyroidism   . Lumbar radiculopathy   . Multiple thyroid nodules    Avascular echogenic areas noted in the right thyroid at the time of carotid Doppler.   Marland Kitchen PONV (postoperative nausea and vomiting)    "nausea with first hip surgery"  . Skin cancer (melanoma) (HCC)    PMH of   . Spinal stenosis, lumbar   . Tremor    Fine tremor right upper extremity  . Venous insufficiency     Past Surgical History:  Procedure Laterality Date  . COLONOSCOPY    . CORONARY ANGIOPLASTY WITH STENT PLACEMENT  2006   x 2 stents; DES to CX and dRCA '06  . KNEE SURGERY  1970's   "chipped bone"  . LUMBAR LAMINECTOMY/DECOMPRESSION MICRODISCECTOMY Left 07/13/2016   Procedure:  LUMBAR LAMINECTOMY AND FORAMINOTOMY Lumbar two, three, Lumbar three-four, Lumbar four-five, LEFT Lumbar five-Sacral one DISECTOMY;  Surgeon: Newman Pies, MD;  Location: [facility];  Service: Neurosurgery;  Laterality: Left;  . TOE SURGERY  1997  . TONSILLECTOMY    . TOTAL HIP ARTHROPLASTY Left 2007  . TOTAL HIP ARTHROPLASTY Right 2010  . TOTAL KNEE ARTHROPLASTY Right 06/25/2014   Procedure: RIGHT TOTAL KNEE ARTHROPLASTY;  Surgeon: Gearlean Alf, MD;  Location: [facility];  Service: Orthopedics;  Laterality: Right;    Current Medications: Outpatient Medications Prior to Visit  Medication Sig Dispense Refill  . amoxicillin (AMOXIL) 500 MG tablet Take 2,000 mg by mouth every hour as needed (For dental appts).    Marland Kitchen aspirin 325 MG tablet Take 1 tablet (325 mg total) by mouth daily. 90 tablet 3  . atorvastatin (LIPITOR) 40 MG tablet Take 0.5 tablets (20 mg total) by mouth at bedtime. 45 tablet 3  . benazepril (LOTENSIN) 20 MG tablet Take 1.5 tablets (30 mg total) by mouth every morning. 135 tablet 3  . diltiazem (DILACOR XR) 180 MG 24 hr capsule Take 1 capsule (180 mg total) by mouth every morning. 90 capsule 3  .  levothyroxine (SYNTHROID, LEVOTHROID) 200 MCG tablet Take 1 tablet (200 mcg total) by mouth daily before breakfast. 30 tablet 5  . LUMIGAN 0.01 % SOLN Place 1 drop into both eyes at bedtime.    . metoprolol tartrate (LOPRESSOR) 25 MG tablet Take 0.5 tablets (12.5 mg total) by mouth 2 (two) times daily. 90 tablet 3  . nitroGLYCERIN (NITROSTAT) 0.4 MG SL tablet Place 1 tablet (0.4 mg total) under the tongue every 5 (five) minutes as needed. 25 tablet 11  . tamsulosin (FLOMAX) 0.4 MG CAPS capsule Take 1 capsule (0.4 mg total) by mouth daily. 30 capsule 0  . furosemide (LASIX) 40 MG tablet Take 40 mg (1 tablet) in the morning. Take 20 mg (1/2 tablet) in the afternoon. 135 tablet 3  . HYDROcodone-acetaminophen (NORCO/VICODIN) 5-325 MG tablet Take 1-2 tablets by mouth every 4 (four) hours as needed  for moderate pain. (Patient not taking: Reported on 09/02/2016) 50 tablet 0   No facility-administered medications prior to visit.      Allergies:   Amlodipine besylate   Social History   Social History  . Marital status: Married    Spouse name: N/A  . Number of children: 2  . Years of education: N/A   Occupational History  . retired, Nurse, learning disability    Social History Main Topics  . Smoking status: Former Smoker    Types: Cigarettes    Quit date: 09/21/1984  . Smokeless tobacco: Never Used     Comment: smoked 1958-1986, up to 1 ppd  . Alcohol use Yes     Comment: socially on occasion  . Drug use: No  . Sexual activity: Not Asked   Other Topics Concern  . None   Social History Narrative   2 daughters, one is a Therapist, sports @ [employer]     Family History:  The patient's family history includes Colon cancer (age of onset: 47) in his father; Coronary artery disease in his mother; Diabetes in his maternal grandfather and sister; Heart attack (age of onset: 69) in his sister; Heart attack (age of onset: 41) in his maternal grandfather; Hypertension in his father; Pancreatitis in his father.     ROS:   Please see the history of present illness.    ROS All other systems reviewed and are negative.   PHYSICAL EXAM:   VS:  BP 124/70   Pulse 74   Ht 5\' 9"  (1.753 m)   Wt 192 lb 12.8 oz (87.5 kg)   SpO2 95%   BMI 28.47 kg/m    GEN: Well nourished, well developed, in no acute distress  HEENT: normal  Neck: no JVD, carotid bruits, or masses Cardiac: RRR; no murmurs, rubs, or gallops,no edema  Respiratory:  clear to auscultation bilaterally, normal work of breathing GI: soft, nontender, nondistended, + BS MS: no deformity or atrophy  Skin: warm and dry, no rash Neuro:  Alert and Oriented x 3, Strength and sensation are intact Psych: euthymic mood, full affect  Wt Readings from Last 3 Encounters:  09/02/16 192 lb 12.8 oz (87.5 kg)  08/12/16 193 lb 3.2 oz (87.6 kg)    07/13/16 192 lb (87.1 kg)      Studies/Labs Reviewed:   EKG:  EKG is NOT ordered today.    Recent Labs: 01/02/2016: ALT 16 05/04/2016: TSH 0.37 07/02/2016: BUN 25; Creatinine, Ser 1.08; Hemoglobin 15.5; Platelets 244; Potassium 4.1; Sodium 141   Lipid Panel    Component Value Date/Time   CHOL 150 01/02/2016 1125  CHOL 161 10/31/2014 0824   TRIG 110.0 01/02/2016 1125   TRIG 81 10/31/2014 0824   TRIG 86 07/23/2006 1012   HDL 43.70 01/02/2016 1125   HDL 49 10/31/2014 0824   CHOLHDL 3 01/02/2016 1125   VLDL 22.0 01/02/2016 1125   LDLCALC 84 01/02/2016 1125   LDLCALC 96 10/31/2014 0824    Additional studies/ records that were reviewed today include:  03/2016 Myoview  Study Highlights   Nuclear stress EF: 62%. Normal LV function  There was no ST segment deviation noted during stress.  The study is normal. There is no evidence of ischemia or previous infarction.  This is a low risk study.     Carotid dopplers 12/2015 Stable carotid artery disease over serial exams. 1-39% bilateral ICA stenosis. Patent vertebral arteries with antegrade flow. Normal subclavian arteries, bilaterally. Abnormal right thyroid appearance.   ASSESSMENT & PLAN:   LE edema: better  on increased lasix. Will go back to lasix 40mg  daily and get a BMET today.   CAD: stable with recent low risk Myoview. Continue ASA, statin and BB  Carotid artery disease: stable doppler study in 12/2015. Repeat due in 2 years (12/2019)  HLD: continue statin. Followed by Dr. Larose Kells  Hypothyroidism: continue synthroid followed by Dr. Larose Kells   Medication Adjustments/Labs and Tests Ordered: Current medicines are reviewed at length with the patient today.  Concerns regarding medicines are outlined above.  Medication changes, Labs and Tests ordered today are listed in the Patient Instructions below. Patient Instructions  Medication Instructions:  Your physician has recommended you make the following change in your  medication:  1.  DECREASE the Lasix to 40 mg taking 1 tablet daily.   Labwork: TODAY:  BMET   Testing/Procedures: None ordered  Follow-Up: Your physician wants you to follow-up in: [place] DR. Johnsie Cancel   You will receive a reminder letter in the mail two months in advance. If you don't receive a letter, please call our office to schedule the follow-up appointment.    Any Other Special Instructions Will Be Listed Below (If Applicable).     If you need a refill on your cardiac medications before your next appointment, please call your pharmacy.      Signed, Angelena Form, PA-C  09/02/2016 12:08 PM    [facility] [address], [place], [place]  [address] Phone: [phone]; Fax: [phone]

## 2016-09-01 DIAGNOSIS — M542 Cervicalgia: Secondary | ICD-10-CM | POA: Diagnosis not present

## 2016-09-02 ENCOUNTER — Other Ambulatory Visit: Payer: Medicare Other | Admitting: *Deleted

## 2016-09-02 ENCOUNTER — Encounter: Payer: Self-pay | Admitting: Physician Assistant

## 2016-09-02 ENCOUNTER — Ambulatory Visit (INDEPENDENT_AMBULATORY_CARE_PROVIDER_SITE_OTHER): Payer: Medicare Other | Admitting: Physician Assistant

## 2016-09-02 VITALS — BP 124/70 | HR 74 | Ht 69.0 in | Wt 192.8 lb

## 2016-09-02 DIAGNOSIS — E039 Hypothyroidism, unspecified: Secondary | ICD-10-CM

## 2016-09-02 DIAGNOSIS — R601 Generalized edema: Secondary | ICD-10-CM

## 2016-09-02 DIAGNOSIS — I6523 Occlusion and stenosis of bilateral carotid arteries: Secondary | ICD-10-CM | POA: Diagnosis not present

## 2016-09-02 DIAGNOSIS — I10 Essential (primary) hypertension: Secondary | ICD-10-CM

## 2016-09-02 DIAGNOSIS — E785 Hyperlipidemia, unspecified: Secondary | ICD-10-CM

## 2016-09-02 LAB — BASIC METABOLIC PANEL
BUN: 18 mg/dL (ref 7–25)
CO2: 25 mmol/L (ref 20–31)
Calcium: 9.3 mg/dL (ref 8.6–10.3)
Chloride: 107 mmol/L (ref 98–110)
Creat: 1.11 mg/dL (ref 0.70–1.18)
Glucose, Bld: 97 mg/dL (ref 65–99)
POTASSIUM: 4.1 mmol/L (ref 3.5–5.3)
SODIUM: 142 mmol/L (ref 135–146)

## 2016-09-02 MED ORDER — FUROSEMIDE 40 MG PO TABS: ORAL_TABLET | ORAL | 1 refills | Status: DC

## 2016-09-02 NOTE — Patient Instructions (Addendum)
Medication Instructions:  Your physician has recommended you make the following change in your medication:  1.  DECREASE the Lasix to 40 mg taking 1 tablet daily.   Labwork: TODAY:  BMET   Testing/Procedures: None ordered  Follow-Up: Your physician wants you to follow-up in: [place] DR. Johnsie Cancel   You will receive a reminder letter in the mail two months in advance. If you don't receive a letter, please call our office to schedule the follow-up appointment.    Any Other Special Instructions Will Be Listed Below (If Applicable).     If you need a refill on your cardiac medications before your next appointment, please call your pharmacy.

## 2016-09-03 DIAGNOSIS — M542 Cervicalgia: Secondary | ICD-10-CM | POA: Diagnosis not present

## 2016-09-10 DIAGNOSIS — M542 Cervicalgia: Secondary | ICD-10-CM | POA: Diagnosis not present

## 2016-09-15 DIAGNOSIS — M542 Cervicalgia: Secondary | ICD-10-CM | POA: Diagnosis not present

## 2016-09-17 DIAGNOSIS — M542 Cervicalgia: Secondary | ICD-10-CM | POA: Diagnosis not present

## 2016-09-22 DIAGNOSIS — M542 Cervicalgia: Secondary | ICD-10-CM | POA: Diagnosis not present

## 2016-11-04 ENCOUNTER — Ambulatory Visit (INDEPENDENT_AMBULATORY_CARE_PROVIDER_SITE_OTHER): Payer: Medicare Other | Admitting: Internal Medicine

## 2016-11-04 ENCOUNTER — Encounter: Payer: Self-pay | Admitting: Internal Medicine

## 2016-11-04 ENCOUNTER — Telehealth: Payer: Self-pay | Admitting: Internal Medicine

## 2016-11-04 VITALS — BP 122/76 | HR 64 | Temp 97.7°F | Resp 14 | Ht 69.0 in | Wt 194.4 lb

## 2016-11-04 DIAGNOSIS — I10 Essential (primary) hypertension: Secondary | ICD-10-CM

## 2016-11-04 DIAGNOSIS — R739 Hyperglycemia, unspecified: Secondary | ICD-10-CM | POA: Diagnosis not present

## 2016-11-04 DIAGNOSIS — E782 Mixed hyperlipidemia: Secondary | ICD-10-CM | POA: Diagnosis not present

## 2016-11-04 LAB — LIPID PANEL
CHOL/HDL RATIO: 3
Cholesterol: 136 mg/dL (ref 0–200)
HDL: 46.2 mg/dL (ref >39.00)
LDL Cholesterol: 73 mg/dL (ref 0–99)
NONHDL: 89.36
Triglycerides: 82 mg/dL (ref 0.0–149.0)
VLDL: 16.4 mg/dL (ref 0.0–40.0)

## 2016-11-04 MED ORDER — AMOXICILLIN-POT CLAVULANATE 875-125 MG PO TABS
1.0000 | ORAL_TABLET | Freq: Two times a day (BID) | ORAL | 0 refills | Status: DC
Start: 2016-11-04 — End: 2017-03-03

## 2016-11-04 NOTE — Patient Instructions (Addendum)
GO TO THE LAB : Get the blood work     GO TO THE FRONT DESK Schedule your next appointment for a  Physical exam in 6-7 months  Also schedule a Medicare wellness with one of the nurses   Take the antibiotic twice a day

## 2016-11-04 NOTE — Telephone Encounter (Signed)
05/04/16 [facility]PS, SUBSEQ VISIT [id number] lvm advising to call back and schedule

## 2016-11-04 NOTE — Progress Notes (Signed)
Subjective:    Patient ID: [name], male    DOB: 24-Jun-1936, 81 y.o.   MRN: [id number]  DOS:  11/04/2016 Type of visit - description : Routine office visit Interval history:  No major concerns. Had back surgery with good results. Saw cardiology, note reviewed, felt to be stable Good compliance with medications. 3 days ago, he was playing with his dog, was not bitten  but his dog's teeth injured his left hand.  Somewhat concerned about  Review of Systems No chest pain or difficulty breathing  Past Medical History:  Diagnosis Date  . Arthritis   . BPH (benign prostatic hypertrophy)   . Carotid artery disease ([place])    Doppler, December 08, 2011, 00 [id number] R. ICA, 123456 LICA, followup 1 year  . Coronary artery disease    DES Circumflex or CVA 2006  /  clear, February, 2011, EF 70%, no ischemia or  . Ejection fraction    EF 60%, echo, 2009, mildly calcified aortic leaflets  . History of colonic polyps   . Hyperlipidemia   . Hypertension   . Hypothyroidism   . Lumbar radiculopathy   . Multiple thyroid nodules    Avascular echogenic areas noted in the right thyroid at the time of carotid Doppler.   Marland Kitchen PONV (postoperative nausea and vomiting)    "nausea with first hip surgery"  . Skin cancer (melanoma) (HCC)    PMH of   . Spinal stenosis, lumbar   . Tremor    Fine tremor right upper extremity  . Venous insufficiency     Past Surgical History:  Procedure Laterality Date  . COLONOSCOPY    . CORONARY ANGIOPLASTY WITH STENT PLACEMENT  2006   x 2 stents; DES to CX and dRCA '06  . KNEE SURGERY  1970's   "chipped bone"  . LUMBAR LAMINECTOMY/DECOMPRESSION MICRODISCECTOMY Left 07/13/2016   Procedure: LUMBAR LAMINECTOMY AND FORAMINOTOMY Lumbar two, three, Lumbar three-four, Lumbar four-five, LEFT Lumbar five-Sacral one DISECTOMY;  Surgeon: Newman Pies, MD;  Location: [facility];  Service: Neurosurgery;  Laterality: Left;  . TOE SURGERY  1997  . TONSILLECTOMY    . TOTAL HIP  ARTHROPLASTY Left 2007  . TOTAL HIP ARTHROPLASTY Right 2010  . TOTAL KNEE ARTHROPLASTY Right 06/25/2014   Procedure: RIGHT TOTAL KNEE ARTHROPLASTY;  Surgeon: Gearlean Alf, MD;  Location: [facility];  Service: Orthopedics;  Laterality: Right;    Social History   Social History  . Marital status: Married    Spouse name: N/A  . Number of children: 2  . Years of education: N/A   Occupational History  . retired, Nurse, learning disability    Social History Main Topics  . Smoking status: Former Smoker    Types: Cigarettes    Quit date: 09/21/1984  . Smokeless tobacco: Never Used     Comment: smoked 1958-1986, up to 1 ppd  . Alcohol use Yes     Comment: socially on occasion  . Drug use: No  . Sexual activity: Not on file   Other Topics Concern  . Not on file   Social History Narrative   2 daughters, one is a Therapist, sports @ [employer]      Allergies as of 11/04/2016      Reactions   Amlodipine Besylate Other (See Comments)   REACTION: edema      Medication List       Accurate as of 11/04/16 11:59 PM. Always use your most recent med list.  amoxicillin 500 MG tablet Commonly known as:  AMOXIL Take 2,000 mg by mouth every hour as needed (For dental appts).   amoxicillin-clavulanate 875-125 MG tablet Commonly known as:  AUGMENTIN Take 1 tablet by mouth 2 (two) times daily.   aspirin 325 MG tablet Take 1 tablet (325 mg total) by mouth daily.   atorvastatin 40 MG tablet Commonly known as:  LIPITOR Take 0.5 tablets (20 mg total) by mouth at bedtime.   benazepril 20 MG tablet Commonly known as:  LOTENSIN Take 1.5 tablets (30 mg total) by mouth every morning.   diltiazem 180 MG 24 hr capsule Commonly known as:  DILACOR XR Take 1 capsule (180 mg total) by mouth every morning.   furosemide 40 MG tablet Commonly known as:  LASIX Take 1 tablet by mouth daily   levothyroxine 200 MCG tablet Commonly known as:  SYNTHROID, LEVOTHROID Take 1 tablet (200 mcg total) by  mouth daily before breakfast.   LUMIGAN 0.01 % Soln Generic drug:  bimatoprost Place 1 drop into both eyes at bedtime.   metoprolol tartrate 25 MG tablet Commonly known as:  LOPRESSOR Take 0.5 tablets (12.5 mg total) by mouth 2 (two) times daily.   nitroGLYCERIN 0.4 MG SL tablet Commonly known as:  NITROSTAT Place 1 tablet (0.4 mg total) under the tongue every 5 (five) minutes as needed.   tamsulosin 0.4 MG Caps capsule Commonly known as:  FLOMAX Take 1 capsule (0.4 mg total) by mouth daily.          Objective:   Physical Exam BP 122/76 (BP Location: Left Arm, Patient Position: Sitting, Cuff Size: Normal)   Pulse 64   Temp 97.7 F (36.5 C) (Oral)   Resp 14   Ht 5\' 9"  (1.753 m)   Wt 194 lb 6 oz (88.2 kg)   SpO2 98%   BMI 28.70 kg/m  General:   Well developed, well nourished . NAD.  HEENT:  Normocephalic . Face symmetric, atraumatic Lungs:  CTA B Normal respiratory effort, no intercostal retractions, no accessory muscle use. Heart: RRR,  no murmur.  No pretibial edema bilaterally  Skin: Dorsum of the left hand which few excoriation, no redness, swelling, discharge. Neurologic:  alert & oriented X3.  Speech normal, gait appropriate for age and unassisted Psych--  Cognition and judgment appear intact.  Cooperative with normal attention span and concentration.  Behavior appropriate. No anxious or depressed appearing.      Assessment & Plan:    Assessment  (Transfer from  Dr. Linna Darner [phone]) Diabetes, A1c 6.2 2013 HTN Hyperlipidemia Hypothyroidism  Thyroid US 2014-- no nodules, inhomogeneous  CV: --CAD: stents 2006;  low risk nuclear scan 2011, 03-2016  --Carotid artery disease [place] 12/2014:   Stable, 1-39% RICA stenosis. Stable, 123456 LICA stenosis. [place] 12/2015: Next 2 years R leg edema, [place] (-)   DVT 08-2014 [facility] DJD: Dr Maureen Ralphs, back surgery Dr Arnoldo Morale 06-2016 Venous insufficiency, chronic R>L edema Tremor glaucoma H/o skin cancer , denies  Melanoma  PLAN: Prediabetes: Last A1c satisfactory HTN: Seems controlled on metoprolol, Lasix, Lotensin, Dilacor. Reports amb BPssatisfactory Hyperlipidemia: On Lipitor, check a lipid panel (no breakfast just had a toast and a banana) Hand excoriation: no bite but dog scratched pt's hand w/ his teeth;  pt is prediabetic, I will treat him with Augmentin preemptively. Knows to call me if he developed any signs of infection. RTC 6-7 months, CPX in Medicare wellness

## 2016-11-04 NOTE — Progress Notes (Signed)
Pre visit review using our clinic review tool, if applicable. No additional management support is needed unless otherwise documented below in the visit note. 

## 2016-11-05 NOTE — Assessment & Plan Note (Signed)
Prediabetes: Last A1c satisfactory HTN: Seems controlled on metoprolol, Lasix, Lotensin, Dilacor. Reports amb BPssatisfactory Hyperlipidemia: On Lipitor, check a lipid panel (no breakfast just had a toast and a banana) Hand excoriation: no bite but dog scratched pt's hand w/ his teeth;  pt is prediabetic, I will treat him with Augmentin preemptively. Knows to call me if he developed any signs of infection. RTC 6-7 months, CPX in Medicare wellness

## 2016-11-17 DIAGNOSIS — M542 Cervicalgia: Secondary | ICD-10-CM | POA: Diagnosis not present

## 2016-11-24 ENCOUNTER — Other Ambulatory Visit: Payer: Self-pay | Admitting: Internal Medicine

## 2016-12-30 DIAGNOSIS — H401134 Primary open-angle glaucoma, bilateral, indeterminate stage: Secondary | ICD-10-CM | POA: Diagnosis not present

## 2017-01-20 DIAGNOSIS — X32XXXD Exposure to sunlight, subsequent encounter: Secondary | ICD-10-CM | POA: Diagnosis not present

## 2017-01-20 DIAGNOSIS — D485 Neoplasm of uncertain behavior of skin: Secondary | ICD-10-CM | POA: Diagnosis not present

## 2017-01-20 DIAGNOSIS — L218 Other seborrheic dermatitis: Secondary | ICD-10-CM | POA: Diagnosis not present

## 2017-01-20 DIAGNOSIS — C4441 Basal cell carcinoma of skin of scalp and neck: Secondary | ICD-10-CM | POA: Diagnosis not present

## 2017-01-20 DIAGNOSIS — L570 Actinic keratosis: Secondary | ICD-10-CM | POA: Diagnosis not present

## 2017-01-22 DIAGNOSIS — Z96643 Presence of artificial hip joint, bilateral: Secondary | ICD-10-CM | POA: Diagnosis not present

## 2017-01-22 DIAGNOSIS — Z96651 Presence of right artificial knee joint: Secondary | ICD-10-CM | POA: Diagnosis not present

## 2017-01-22 DIAGNOSIS — Z471 Aftercare following joint replacement surgery: Secondary | ICD-10-CM | POA: Diagnosis not present

## 2017-02-01 ENCOUNTER — Other Ambulatory Visit: Payer: Self-pay | Admitting: Internal Medicine

## 2017-02-02 DIAGNOSIS — C4441 Basal cell carcinoma of skin of scalp and neck: Secondary | ICD-10-CM | POA: Diagnosis not present

## 2017-02-12 ENCOUNTER — Telehealth: Payer: Self-pay | Admitting: Cardiovascular Disease

## 2017-02-12 NOTE — Telephone Encounter (Signed)
Follow up     Pt called back he is just going to keep his June 13th appt , done worry about this

## 2017-02-12 NOTE — Telephone Encounter (Signed)
New Message    Pt is wanting to change his 03/03/17 appt , is it okay if he schedule with one of the [facility]? Or does Dr Johnsie Cancel need to see him ?

## 2017-02-28 NOTE — Progress Notes (Signed)
Cardiology Office Note    Date:  03/03/2017   ID:  [name], DOB 1936-07-20, MRN [id number]  PCP:  Colon Branch, MD  Cardiologist:  Dr. Johnsie Cancel  CC: 3 week follow up   History of Present Illness:  [name] is a 81 y.o. male with a history of CAD s/p DES to LCx (2006), carotid artery disease, HTN, HLD who presents to clinic for follow up.   He  had a normal nuclear stress test in 03/2016 prior to back surgery.   He was seen by me  08/12/16. He complained of dependant edema in RLE where he had TKA 3 years prior. His lasix was increased to 40mg  in AM and 20mg  in PM.  And then reduced back to daily with improvement   Today he presents to clinic for follow up. No chest pain or SOB. LE edema is  better. No orthopnea or PND. No dizziness or syncope. He is feeling quite well. No complaints.   Had residual leg pain after Right TKR and ended up having lumbar surgery with Dr Arnoldo Morale With relief    Past Medical History:  Diagnosis Date  . Arthritis   . BPH (benign prostatic hypertrophy)   . Carotid artery disease ([place])    Doppler, December 08, 2011, 00 57% R. ICA, 32-20% LICA, followup 1 year  . Coronary artery disease    DES Circumflex or CVA 2006  /  clear, February, 2011, EF 70%, no ischemia or  . Ejection fraction    EF 60%, echo, 2009, mildly calcified aortic leaflets  . History of colonic polyps   . Hyperlipidemia   . Hypertension   . Hypothyroidism   . Lumbar radiculopathy   . Multiple thyroid nodules    Avascular echogenic areas noted in the right thyroid at the time of carotid Doppler.   Marland Kitchen PONV (postoperative nausea and vomiting)    "nausea with first hip surgery"  . Skin cancer (melanoma) (HCC)    PMH of   . Spinal stenosis, lumbar   . Tremor    Fine tremor right upper extremity  . Venous insufficiency     Past Surgical History:  Procedure Laterality Date  . COLONOSCOPY    . CORONARY ANGIOPLASTY WITH STENT PLACEMENT  2006   x 2 stents; DES to CX and dRCA  '06  . KNEE SURGERY  1970's   "chipped bone"  . LUMBAR LAMINECTOMY/DECOMPRESSION MICRODISCECTOMY Left 07/13/2016   Procedure: LUMBAR LAMINECTOMY AND FORAMINOTOMY Lumbar two, three, Lumbar three-four, Lumbar four-five, LEFT Lumbar five-Sacral one DISECTOMY;  Surgeon: Newman Pies, MD;  Location: [facility];  Service: Neurosurgery;  Laterality: Left;  . TOE SURGERY  1997  . TONSILLECTOMY    . TOTAL HIP ARTHROPLASTY Left 2007  . TOTAL HIP ARTHROPLASTY Right 2010  . TOTAL KNEE ARTHROPLASTY Right 06/25/2014   Procedure: RIGHT TOTAL KNEE ARTHROPLASTY;  Surgeon: Gearlean Alf, MD;  Location: [facility];  Service: Orthopedics;  Laterality: Right;    Current Medications: Outpatient Medications Prior to Visit  Medication Sig Dispense Refill  . amoxicillin (AMOXIL) 500 MG tablet Take 2,000 mg by mouth every hour as needed (For dental appts).    Marland Kitchen atorvastatin (LIPITOR) 40 MG tablet Take 0.5 tablets (20 mg total) by mouth at bedtime. 45 tablet 3  . benazepril (LOTENSIN) 20 MG tablet Take 1.5 tablets (30 mg total) by mouth every morning. 135 tablet 3  . diltiazem (DILT-XR) 180 MG 24 hr capsule Take 1 capsule (180 mg  total) by mouth daily. 90 capsule 1  . levothyroxine (SYNTHROID, LEVOTHROID) 200 MCG tablet Take 1 tablet (200 mcg total) by mouth daily before breakfast. 90 tablet 1  . LUMIGAN 0.01 % SOLN Place 1 drop into both eyes at bedtime.    . metoprolol tartrate (LOPRESSOR) 25 MG tablet Take 0.5 tablets (12.5 mg total) by mouth 2 (two) times daily. 90 tablet 3  . aspirin 325 MG tablet Take 1 tablet (325 mg total) by mouth daily. 90 tablet 3  . furosemide (LASIX) 40 MG tablet Take 1 tablet by mouth daily 90 tablet 1  . nitroGLYCERIN (NITROSTAT) 0.4 MG SL tablet Place 1 tablet (0.4 mg total) under the tongue every 5 (five) minutes as needed. 25 tablet 11  . amoxicillin-clavulanate (AUGMENTIN) 875-125 MG tablet Take 1 tablet by mouth 2 (two) times daily. 14 tablet 0  . tamsulosin (FLOMAX) 0.4 MG CAPS  capsule Take 1 capsule (0.4 mg total) by mouth daily. 30 capsule 0   No facility-administered medications prior to visit.      Allergies:   Amlodipine besylate   Social History   Social History  . Marital status: Married    Spouse name: N/A  . Number of children: 2  . Years of education: N/A   Occupational History  . retired, Nurse, learning disability    Social History Main Topics  . Smoking status: Former Smoker    Types: Cigarettes    Quit date: 09/21/1984  . Smokeless tobacco: Never Used     Comment: smoked 1958-1986, up to 1 ppd  . Alcohol use Yes     Comment: socially on occasion  . Drug use: No  . Sexual activity: Not Asked   Other Topics Concern  . None   Social History Narrative   2 daughters, one is a Therapist, sports @ [employer]     Family History:  The patient's family history includes Colon cancer (age of onset: 35) in his father; Coronary artery disease in his mother; Diabetes in his maternal grandfather and sister; Heart attack (age of onset: 35) in his sister; Heart attack (age of onset: 75) in his maternal grandfather; Hypertension in his father; Pancreatitis in his father.     ROS:   Please see the history of present illness.    ROS All other systems reviewed and are negative.   PHYSICAL EXAM:   VS:  BP 110/60   Pulse 62   Ht 5\' 9"  (1.753 m)   Wt 86.6 kg (191 lb)   SpO2 97%   BMI 28.21 kg/m    Affect appropriate Healthy:  appears stated age 5: normal Neck supple with no adenopathy JVP normal no bruits no thyromegaly Lungs clear with no wheezing and good diaphragmatic motion Heart:  S1/S2 no murmur, no rub, gallop or click PMI normal Abdomen: benighn, BS positve, no tenderness, no AAA no bruit.  No HSM or HJR Distal pulses intact with no bruits No edema Neuro non-focal Skin warm and dry No muscular weakness Lumbar scar    Wt Readings from Last 3 Encounters:  03/03/17 86.6 kg (191 lb)  11/04/16 88.2 kg (194 lb 6 oz)  09/02/16 87.5 kg  (192 lb 12.8 oz)      Studies/Labs Reviewed:   EKG:   NSR rate 60 PAC LAFB stable   Recent Labs: 05/04/2016: TSH 0.37 07/02/2016: Hemoglobin 15.5; Platelets 244 09/02/2016: BUN 18; Creat 1.11; Potassium 4.1; Sodium 142   Lipid Panel    Component Value Date/Time  CHOL 136 11/04/2016 1134   CHOL 161 10/31/2014 0824   TRIG 82.0 11/04/2016 1134   TRIG 81 10/31/2014 0824   TRIG 86 07/23/2006 1012   HDL 46.20 11/04/2016 1134   HDL 49 10/31/2014 0824   CHOLHDL 3 11/04/2016 1134   VLDL 16.4 11/04/2016 1134   LDLCALC 73 11/04/2016 1134   LDLCALC 96 10/31/2014 0824    Additional studies/ records that were reviewed today include:  03/2016 Myoview  Study Highlights   Nuclear stress EF: 62%. Normal LV function  There was no ST segment deviation noted during stress.  The study is normal. There is no evidence of ischemia or previous infarction.  This is a low risk study.     Carotid dopplers 12/2015 Stable carotid artery disease over serial exams. 1-39% bilateral ICA stenosis. Patent vertebral arteries with antegrade flow. Normal subclavian arteries, bilaterally. Abnormal right thyroid appearance.   ASSESSMENT & PLAN:   LE edema: better  Continue daily lasix   CAD: stable with recent low risk Myoview. Continue ASA, statin and BB  Carotid artery disease: stable doppler study in 12/2015. Repeat due in 2 years (12/2019)  HLD: continue statin. Followed by Dr. Larose Kells  Hypothyroidism: continue synthroid followed by Dr. Irma Newness:  Post right TKR and lumbar surgery still getting PT for tight hamstring    Medication Adjustments/Labs and Tests Ordered: Current medicines are reviewed at length with the patient today.  Concerns regarding medicines are outlined above.  Medication changes, Labs and Tests ordered today are listed in the Patient Instructions below. Patient Instructions  Medication Instructions:  Your physician has recommended you make the following change in your  medication:  1-Decrease Aspirin to 81 mg by mouth daily  Labwork: NONE  Testing/Procedures: NONE  Follow-Up: Your physician wants you to follow-up in: 6 months with Dr. Johnsie Cancel. You will receive a reminder letter in the mail two months in advance. If you don't receive a letter, please call our office to schedule the follow-up appointment.   If you need a refill on your cardiac medications before your next appointment, please call your pharmacy.       Signed, Jenkins Rouge, MD  03/03/2017 10:50 AM    [address], [place], [place]  [address] Phone: [phone]; Fax: [phone]

## 2017-03-03 ENCOUNTER — Ambulatory Visit (INDEPENDENT_AMBULATORY_CARE_PROVIDER_SITE_OTHER): Payer: Medicare Other | Admitting: Cardiovascular Disease

## 2017-03-03 ENCOUNTER — Encounter: Payer: Self-pay | Admitting: Cardiovascular Disease

## 2017-03-03 ENCOUNTER — Other Ambulatory Visit: Payer: Self-pay | Admitting: Cardiovascular Disease

## 2017-03-03 VITALS — BP 110/60 | HR 62 | Ht 69.0 in | Wt 191.0 lb

## 2017-03-03 DIAGNOSIS — I6523 Occlusion and stenosis of bilateral carotid arteries: Secondary | ICD-10-CM | POA: Diagnosis not present

## 2017-03-03 DIAGNOSIS — I25810 Atherosclerosis of coronary artery bypass graft(s) without angina pectoris: Secondary | ICD-10-CM

## 2017-03-03 DIAGNOSIS — R601 Generalized edema: Secondary | ICD-10-CM

## 2017-03-03 MED ORDER — ASPIRIN 81 MG PO TABS: 81.0000 mg | ORAL_TABLET | Freq: Every day | ORAL | Status: AC

## 2017-03-03 MED ORDER — FUROSEMIDE 40 MG PO TABS: ORAL_TABLET | ORAL | 3 refills | Status: DC

## 2017-03-03 MED ORDER — NITROGLYCERIN 0.4 MG SL SUBL: 0.4000 mg | SUBLINGUAL_TABLET | SUBLINGUAL | 1 refills | Status: DC | PRN

## 2017-03-03 NOTE — Patient Instructions (Addendum)
Medication Instructions:  Your physician has recommended you make the following change in your medication:  1-Decrease Aspirin to 81 mg by mouth daily   Labwork: NONE  Testing/Procedures: NONE  Follow-Up: Your physician wants you to follow-up in: 6 months with Dr. Nishan. You will receive a reminder letter in the mail two months in advance. If you don't receive a letter, please call our office to schedule the follow-up appointment.   If you need a refill on your cardiac medications before your next appointment, please call your pharmacy.    

## 2017-03-23 DIAGNOSIS — X32XXXD Exposure to sunlight, subsequent encounter: Secondary | ICD-10-CM | POA: Diagnosis not present

## 2017-03-23 DIAGNOSIS — Z85828 Personal history of other malignant neoplasm of skin: Secondary | ICD-10-CM | POA: Diagnosis not present

## 2017-03-23 DIAGNOSIS — L570 Actinic keratosis: Secondary | ICD-10-CM | POA: Diagnosis not present

## 2017-03-23 DIAGNOSIS — Z08 Encounter for follow-up examination after completed treatment for malignant neoplasm: Secondary | ICD-10-CM | POA: Diagnosis not present

## 2017-04-28 DIAGNOSIS — H524 Presbyopia: Secondary | ICD-10-CM | POA: Diagnosis not present

## 2017-04-28 DIAGNOSIS — H401134 Primary open-angle glaucoma, bilateral, indeterminate stage: Secondary | ICD-10-CM | POA: Diagnosis not present

## 2017-04-28 DIAGNOSIS — H43813 Vitreous degeneration, bilateral: Secondary | ICD-10-CM | POA: Diagnosis not present

## 2017-04-28 DIAGNOSIS — H2513 Age-related nuclear cataract, bilateral: Secondary | ICD-10-CM | POA: Diagnosis not present

## 2017-04-29 ENCOUNTER — Encounter: Payer: Medicare Other | Admitting: Internal Medicine

## 2017-04-29 NOTE — Progress Notes (Deleted)
Subjective:   [name] is a 81 y.o. male who presents for Medicare Annual/Subsequent preventive examination.  Review of Systems:  No ROS.  Medicare Wellness Visit. Additional risk factors are reflected in the social history.   Sleep patterns:  Home Safety/Smoke Alarms: Feels safe in home. Smoke alarms in place.  Living environment; residence and Firearm Safety:  [place] Safety/Bike Helmet: Wears seat belt.   Counseling:   Eye Exam-  Dental-  Male:   CCS- last 08/15/13:  Polyp removed. Biopsies did not show any remarkable findings. Moderate diverticulosis. Otherwise normal. No recall do to age.  PSA-  Lab Results  Component Value Date   PSA 0.92 11/04/2009   PSA 0.91 11/09/2008   PSA 0.62 08/10/2007       Objective:    Vitals: There were no vitals taken for this visit.  There is no height or weight on file to calculate BMI.  Tobacco History  Smoking Status  . Former Smoker  . Types: Cigarettes  . Quit date: 09/21/1984  Smokeless Tobacco  . Never Used    Comment: smoked 1958-1986, up to 1 ppd     Counseling given: Not Answered   Past Medical History:  Diagnosis Date  . Arthritis   . BPH (benign prostatic hypertrophy)   . Carotid artery disease ([place])    Doppler, December 08, 2011, 00 73% R. ICA, 22-02% LICA, followup 1 year  . Coronary artery disease    DES Circumflex or CVA 2006  /  clear, February, 2011, EF 70%, no ischemia or  . Ejection fraction    EF 60%, echo, 2009, mildly calcified aortic leaflets  . History of colonic polyps   . Hyperlipidemia   . Hypertension   . Hypothyroidism   . Lumbar radiculopathy   . Multiple thyroid nodules    Avascular echogenic areas noted in the right thyroid at the time of carotid Doppler.   Marland Kitchen PONV (postoperative nausea and vomiting)    "nausea with first hip surgery"  . Skin cancer (melanoma) (HCC)    PMH of   . Spinal stenosis, lumbar   . Tremor    Fine tremor right upper extremity  . Venous insufficiency      Past Surgical History:  Procedure Laterality Date  . COLONOSCOPY    . CORONARY ANGIOPLASTY WITH STENT PLACEMENT  2006   x 2 stents; DES to CX and dRCA '06  . KNEE SURGERY  1970's   "chipped bone"  . LUMBAR LAMINECTOMY/DECOMPRESSION MICRODISCECTOMY Left 07/13/2016   Procedure: LUMBAR LAMINECTOMY AND FORAMINOTOMY Lumbar two, three, Lumbar three-four, Lumbar four-five, LEFT Lumbar five-Sacral one DISECTOMY;  Surgeon: Newman Pies, MD;  Location: [facility];  Service: Neurosurgery;  Laterality: Left;  . TOE SURGERY  1997  . TONSILLECTOMY    . TOTAL HIP ARTHROPLASTY Left 2007  . TOTAL HIP ARTHROPLASTY Right 2010  . TOTAL KNEE ARTHROPLASTY Right 06/25/2014   Procedure: RIGHT TOTAL KNEE ARTHROPLASTY;  Surgeon: Gearlean Alf, MD;  [facility];  Service: Orthopedics;  Laterality: Right;   Family History  Problem Relation Age of Onset  . Coronary artery disease Mother        carotid endarterectomy bilaterally  . Colon cancer Father 53  . Hypertension Father   . Pancreatitis Father   . Diabetes Sister   . Diabetes Maternal Grandfather   . Heart attack Maternal Grandfather 70  . Heart attack Sister 76  . Thyroid disease Neg Hx   . Stroke Neg Hx   .  Prostate cancer Neg Hx    History  Sexual Activity  . Sexual activity: Not on file    Outpatient Encounter Prescriptions as of 05/11/2017  Medication Sig  . amoxicillin (AMOXIL) 500 MG tablet Take 2,000 mg by mouth every hour as needed (For dental appts).  Marland Kitchen aspirin 81 MG tablet Take 1 tablet (81 mg total) by mouth daily.  Marland Kitchen atorvastatin (LIPITOR) 40 MG tablet Take 0.5 tablets (20 mg total) by mouth at bedtime.  . benazepril (LOTENSIN) 20 MG tablet Take 1.5 tablets (30 mg total) by mouth every morning.  . diltiazem (DILT-XR) 180 MG 24 hr capsule Take 1 capsule (180 mg total) by mouth daily.  . furosemide (LASIX) 40 MG tablet Take 1 tablet by mouth daily  . levothyroxine (SYNTHROID, LEVOTHROID) 200 MCG tablet Take 1 tablet (200 mcg  total) by mouth daily before breakfast.  . LUMIGAN 0.01 % SOLN Place 1 drop into both eyes at bedtime.  . metoprolol tartrate (LOPRESSOR) 25 MG tablet Take 0.5 tablets (12.5 mg total) by mouth 2 (two) times daily.  . nitroGLYCERIN (NITROSTAT) 0.4 MG SL tablet PLACE 1 TABLET UNDER THE TONGUE EVERY 5 MINUTES AS NEEDED   No facility-administered encounter medications on file as of 05/11/2017.     Activities of Daily Living In your present state of health, do you have any difficulty performing the following activities: 07/02/2016 05/04/2016  Hearing? N N  Vision? N Y  Difficulty concentrating or making decisions? N N  Walking or climbing stairs? Y Y  Dressing or bathing? N N  Doing errands, shopping? N N  Some recent data might be hidden    Patient Care Team: Colon Branch, MD as PCP - General (Internal Medicine) Josue Hector, MD as Consulting Physician (Cardiology) Allyn Kenner, MD (Dermatology) Gaynelle Arabian, MD as Consulting Physician (Orthopedic Surgery) Newman Pies, MD as Consulting Physician (Neurosurgery) Marygrace Drought, MD as Consulting Physician (Ophthalmology)   Assessment:    Physical assessment deferred to PCP.  Exercise Activities and Dietary recommendations   Diet (meal preparation, eat out, water intake, caffeinated beverages, dairy products, fruits and vegetables): {Desc; diets:16563} Breakfast: Lunch:  Dinner:      Goals    None     Fall Risk Fall Risk  05/04/2016 12/31/2015 09/18/2014 07/28/2013  Falls in the past year? No No No No   Depression Screen PHQ 2/9 Scores 05/04/2016 12/31/2015 09/18/2014 07/28/2013  PHQ - 2 Score 0 0 0 0    Cognitive Function        Immunization History  Administered Date(s) Administered  . Influenza Split 08/12/2011, 09/06/2012  . Influenza Whole 07/23/2006, 08/10/2007, 06/27/2008, 08/21/2009, 08/19/2010  . Influenza, High Dose Seasonal PF 07/20/2013, 09/19/2015  . Influenza,inj,Quad PF,36+ Mos 07/14/2016  .  Influenza-Unspecified 06/21/2014  . Pneumococcal Conjugate-13 02/19/2015  . Pneumococcal Polysaccharide-23 09/01/2013  . Td 09/23/1998  . Tdap 09/01/2013  . Zoster 09/29/2013   Screening Tests Health Maintenance  Topic Date Due  . HEMOGLOBIN A1C  11/04/2016  . INFLUENZA VACCINE  04/21/2017  . FOOT EXAM  05/04/2017  . OPHTHALMOLOGY EXAM  07/10/2017  . TETANUS/TDAP  09/02/2023  . PNA vac Low Risk Adult  Completed      Plan:   ***  I have personally reviewed and noted the following in the patient's chart:   . Medical and social history . Use of alcohol, tobacco or illicit drugs  . Current medications and supplements . Functional ability and status . Nutritional status . Physical activity .  Advanced directives . List of other physicians . Hospitalizations, surgeries, and ER visits in previous 12 months . Vitals . Screenings to include cognitive, depression, and falls . Referrals and appointments  In addition, I have reviewed and discussed with patient certain preventive protocols, quality metrics, and best practice recommendations. A written personalized care plan for preventive services as well as general preventive health recommendations were provided to patient.     Shela Nevin, [place]  04/29/2017

## 2017-05-11 ENCOUNTER — Encounter: Payer: Self-pay | Admitting: Internal Medicine

## 2017-05-11 ENCOUNTER — Ambulatory Visit (INDEPENDENT_AMBULATORY_CARE_PROVIDER_SITE_OTHER): Payer: Medicare Other | Admitting: Internal Medicine

## 2017-05-11 VITALS — BP 126/70 | HR 56 | Temp 98.2°F | Resp 14 | Ht 69.0 in | Wt 191.1 lb

## 2017-05-11 DIAGNOSIS — I10 Essential (primary) hypertension: Secondary | ICD-10-CM

## 2017-05-11 DIAGNOSIS — E785 Hyperlipidemia, unspecified: Secondary | ICD-10-CM

## 2017-05-11 DIAGNOSIS — Z0000 Encounter for general adult medical examination without abnormal findings: Secondary | ICD-10-CM

## 2017-05-11 DIAGNOSIS — R739 Hyperglycemia, unspecified: Secondary | ICD-10-CM | POA: Diagnosis not present

## 2017-05-11 DIAGNOSIS — E039 Hypothyroidism, unspecified: Secondary | ICD-10-CM

## 2017-05-11 DIAGNOSIS — I2510 Atherosclerotic heart disease of native coronary artery without angina pectoris: Secondary | ICD-10-CM | POA: Diagnosis not present

## 2017-05-11 LAB — COMPREHENSIVE METABOLIC PANEL
ALT: 16 U/L (ref 0–53)
AST: 21 U/L (ref 0–37)
Albumin: 4.1 g/dL (ref 3.5–5.2)
Alkaline Phosphatase: 61 U/L (ref 39–117)
BUN: 19 mg/dL (ref 6–23)
CALCIUM: 9.6 mg/dL (ref 8.4–10.5)
CHLORIDE: 106 meq/L (ref 96–112)
CO2: 29 mEq/L (ref 19–32)
CREATININE: 1.03 mg/dL (ref 0.40–1.50)
GFR: 74.01 mL/min (ref >60.00)
Glucose, Bld: 92 mg/dL (ref 70–99)
Potassium: 4 mEq/L (ref 3.5–5.1)
Sodium: 140 mEq/L (ref 135–145)
Total Bilirubin: 0.7 mg/dL (ref 0.2–1.2)
Total Protein: 7.3 g/dL (ref 6.0–8.3)

## 2017-05-11 LAB — CBC WITH DIFFERENTIAL/PLATELET
BASOS PCT: 2 % (ref 0.0–3.0)
Basophils Absolute: 0.1 10*3/uL (ref 0.0–0.1)
Eosinophils Absolute: 0.7 10*3/uL (ref 0.0–0.7)
Eosinophils Relative: 11.7 % — ABNORMAL HIGH (ref 0.0–5.0)
HCT: 47.6 % (ref 39.0–52.0)
Hemoglobin: 16 g/dL (ref 13.0–17.0)
LYMPHS PCT: 20.6 % (ref 12.0–46.0)
Lymphs Abs: 1.2 10*3/uL (ref 0.7–4.0)
MCHC: 33.5 g/dL (ref 30.0–36.0)
MCV: 92.1 fl (ref 78.0–100.0)
MONOS PCT: 10.4 % (ref 3.0–12.0)
Monocytes Absolute: 0.6 10*3/uL (ref 0.1–1.0)
NEUTROS ABS: 3.3 10*3/uL (ref 1.4–7.7)
NEUTROS PCT: 55.3 % (ref 43.0–77.0)
PLATELETS: 240 10*3/uL (ref 150.0–400.0)
RBC: 5.17 Mil/uL (ref 4.22–5.81)
RDW: 14.6 % (ref 11.5–15.5)
WBC: 6 10*3/uL (ref 4.0–10.5)

## 2017-05-11 LAB — HEMOGLOBIN A1C: Hgb A1c MFr Bld: 6 % (ref 4.6–6.5)

## 2017-05-11 LAB — TSH: TSH: 0.32 u[IU]/mL — AB (ref 0.35–4.50)

## 2017-05-11 MED ORDER — BENAZEPRIL HCL 20 MG PO TABS: 30.0000 mg | ORAL_TABLET | Freq: Every morning | ORAL | 2 refills | Status: DC

## 2017-05-11 MED ORDER — DILTIAZEM HCL ER 180 MG PO CP24: 180.0000 mg | ORAL_CAPSULE | Freq: Every day | ORAL | 2 refills | Status: DC

## 2017-05-11 MED ORDER — ATORVASTATIN CALCIUM 40 MG PO TABS: 20.0000 mg | ORAL_TABLET | Freq: Every day | ORAL | 2 refills | Status: DC

## 2017-05-11 MED ORDER — METOPROLOL TARTRATE 25 MG PO TABS: 12.5000 mg | ORAL_TABLET | Freq: Two times a day (BID) | ORAL | 2 refills | Status: DC

## 2017-05-11 NOTE — Patient Instructions (Signed)
GO TO THE LAB : Get the blood work     GO TO THE FRONT DESK Schedule your next appointment for a  routine checkup in 6 months  Please reschedule your Medicare wellness with one of our nurses

## 2017-05-11 NOTE — Progress Notes (Signed)
Pre visit review using our clinic review tool, if applicable. No additional management support is needed unless otherwise documented below in the visit note. 

## 2017-05-11 NOTE — Progress Notes (Signed)
Subjective:    Patient ID: [name], male    DOB: 1936/02/09, 81 y.o.   MRN: [id number]  DOS:  05/11/2017 Type of visit - description : cpx Interval history: No major concerns, good compliance with medications.   Review of Systems Aches and pains are at baseline. No recent chest pain, has not needed nitroglycerin  He remains physically active going to the gym and doing yard work  Other than above, a 14 point review of systems is negative    Past Medical History:  Diagnosis Date  . Arthritis   . BPH (benign prostatic hypertrophy)   . Carotid artery disease ([place])    Doppler, December 08, 2011, 00 10% R. ICA, 25-85% LICA, followup 1 year  . Coronary artery disease    DES Circumflex or CVA 2006  /  clear, February, 2011, EF 70%, no ischemia or  . Ejection fraction    EF 60%, echo, 2009, mildly calcified aortic leaflets  . History of colonic polyps   . Hyperlipidemia   . Hypertension   . Hypothyroidism   . Lumbar radiculopathy   . Multiple thyroid nodules    Avascular echogenic areas noted in the right thyroid at the time of carotid Doppler.   Marland Kitchen PONV (postoperative nausea and vomiting)    "nausea with first hip surgery"  . Skin cancer (melanoma) (HCC)    PMH of   . Spinal stenosis, lumbar   . Tremor    Fine tremor right upper extremity  . Venous insufficiency     Past Surgical History:  Procedure Laterality Date  . COLONOSCOPY    . CORONARY ANGIOPLASTY WITH STENT PLACEMENT  2006   x 2 stents; DES to CX and dRCA '06  . KNEE SURGERY  1970's   "chipped bone"  . LUMBAR LAMINECTOMY/DECOMPRESSION MICRODISCECTOMY Left 07/13/2016   Procedure: LUMBAR LAMINECTOMY AND FORAMINOTOMY Lumbar two, three, Lumbar three-four, Lumbar four-five, LEFT Lumbar five-Sacral one DISECTOMY;  Surgeon: Newman Pies, MD;  Location: [facility];  Service: Neurosurgery;  Laterality: Left;  . TOE SURGERY  1997  . TONSILLECTOMY    . TOTAL HIP ARTHROPLASTY Left 2007  . TOTAL HIP ARTHROPLASTY Right  2010  . TOTAL KNEE ARTHROPLASTY Right 06/25/2014   Procedure: RIGHT TOTAL KNEE ARTHROPLASTY;  Surgeon: Gearlean Alf, MD;  [facility];  Service: Orthopedics;  Laterality: Right;    Social History   Social History  . Marital status: Married    Spouse name: N/A  . Number of children: 2  . Years of education: N/A   Occupational History  . retired, Nurse, learning disability    Social History Main Topics  . Smoking status: Former Smoker    Types: Cigarettes    Quit date: 09/21/1984  . Smokeless tobacco: Never Used     Comment: smoked 1958-1986, up to 1 ppd  . Alcohol use Yes     Comment: socially on occasion  . Drug use: No  . Sexual activity: Not on file   Other Topics Concern  . Not on file   Social History Narrative   2 daughters, one is a Therapist, sports @ [employer]     Family History  Problem Relation Age of Onset  . Coronary artery disease Mother        carotid endarterectomy bilaterally  . Colon cancer Father 59  . Hypertension Father   . Pancreatitis Father   . Diabetes Sister   . Diabetes Maternal Grandfather   . Heart attack Maternal Grandfather  56  . Heart attack Sister 82  . Thyroid disease Neg Hx   . Stroke Neg Hx   . Prostate cancer Neg Hx      Allergies as of 05/11/2017      Reactions   Amlodipine Besylate Other (See Comments)   REACTION: edema      Medication List       Accurate as of 05/11/17  8:06 PM. Always use your most recent med list.          amoxicillin 500 MG tablet Commonly known as:  AMOXIL Take 2,000 mg by mouth every hour as needed (For dental appts).   aspirin 81 MG tablet Take 1 tablet (81 mg total) by mouth daily.   atorvastatin 40 MG tablet Commonly known as:  LIPITOR Take 0.5 tablets (20 mg total) by mouth at bedtime.   benazepril 20 MG tablet Commonly known as:  LOTENSIN Take 1.5 tablets (30 mg total) by mouth every morning.   diltiazem 180 MG 24 hr capsule Commonly known as:  DILT-XR Take 1 capsule (180 mg  total) by mouth daily.   furosemide 40 MG tablet Commonly known as:  LASIX Take 1 tablet by mouth daily   levothyroxine 200 MCG tablet Commonly known as:  SYNTHROID, LEVOTHROID Take 1 tablet (200 mcg total) by mouth daily before breakfast.   LUMIGAN 0.01 % Soln Generic drug:  bimatoprost Place 1 drop into both eyes at bedtime.   metoprolol tartrate 25 MG tablet Commonly known as:  LOPRESSOR Take 0.5 tablets (12.5 mg total) by mouth 2 (two) times daily.   nitroGLYCERIN 0.4 MG SL tablet Commonly known as:  NITROSTAT PLACE 1 TABLET UNDER THE TONGUE EVERY 5 MINUTES AS NEEDED          Objective:   Physical Exam BP 126/70 (BP Location: Left Arm, Patient Position: Sitting, Cuff Size: Normal)   Pulse (!) 56   Temp 98.2 F (36.8 C) (Oral)   Resp 14   Ht 5\' 9"  (1.753 m)   Wt 191 lb 2 oz (86.7 kg)   SpO2 94%   BMI 28.22 kg/m   General:   Well developed, well nourished . NAD.  Neck: No  thyromegaly  HEENT:  Normocephalic . Face symmetric, atraumatic Lungs:  CTA B Normal respiratory effort, no intercostal retractions, no accessory muscle use. Heart: RRR,  no murmur.  No pretibial edema bilaterally  Abdomen:  Not distended, soft, non-tender. No rebound or rigidity.   Skin: Exposed areas without rash. Not pale. Not jaundice Neurologic:  alert & oriented X3.  Speech normal, gait appropriate for age and unassisted. Somewhat limited by DJD Strength symmetric and appropriate for age.  Psych: Cognition and judgment appear intact.  Cooperative with normal attention span and concentration.  Behavior appropriate. No anxious or depressed appearing.    Assessment & Plan:    Assessment  (Transfer from  Dr. Linna Darner [phone]) Diabetes, A1c 6.2 2013 HTN Hyperlipidemia Hypothyroidism  Thyroid US 2014-- no nodules, inhomogeneous  CV: --CAD: stents 2006;  low risk nuclear scan 2011, 03-2016  --Carotid artery disease [place] 12/2014:   Stable, 1-39% RICA stenosis. Stable, 21-19% LICA  stenosis. [place] 12/2015: Next 2 years R leg edema, [place] (-)   DVT 08-2014 [facility] DJD: Dr Maureen Ralphs, back surgery Dr Arnoldo Morale 06-2016 Venous insufficiency, chronic R>L edema Tremor glaucoma H/o skin cancer , denies Melanoma  PLAN: Here for CPX Chronic medical problems seem stable. Will refill multiple medications, will check a CMP, CBC, A1c and TSH RTC 6  months.

## 2017-05-11 NOTE — Assessment & Plan Note (Signed)
Here for CPX Chronic medical problems seem stable. Will refill multiple medications, will check a CMP, CBC, A1c and TSH RTC 6 months.

## 2017-05-11 NOTE — Assessment & Plan Note (Addendum)
-  Td 2014, pnm 2014, Prevnar 2016 ; zostavax 2015; shingrex shot discusses  -Last cscope 2014 + polyps , no further cscope -Prostate cancer screening discuss, again declined further screening   -Labs:  CMP, CBC, A1c, TSH

## 2017-05-14 DIAGNOSIS — I10 Essential (primary) hypertension: Secondary | ICD-10-CM | POA: Diagnosis not present

## 2017-05-14 DIAGNOSIS — M542 Cervicalgia: Secondary | ICD-10-CM | POA: Diagnosis not present

## 2017-06-09 DIAGNOSIS — H401134 Primary open-angle glaucoma, bilateral, indeterminate stage: Secondary | ICD-10-CM | POA: Diagnosis not present

## 2017-06-16 DIAGNOSIS — M542 Cervicalgia: Secondary | ICD-10-CM | POA: Diagnosis not present

## 2017-06-21 DIAGNOSIS — M542 Cervicalgia: Secondary | ICD-10-CM | POA: Diagnosis not present

## 2017-07-04 ENCOUNTER — Telehealth: Payer: Self-pay | Admitting: Internal Medicine

## 2017-07-06 NOTE — Telephone Encounter (Signed)
[name] Self [phone]  atorvastatin (LIPITOR) 40 MG tablet    [name] called to say he needs a refill for his Lipitor and he would also like for you to check all of his medications and make sure there is several refills on them.

## 2017-07-06 NOTE — Telephone Encounter (Signed)
Atorvastatin, benazepril, diltiazem, metoprolol were sent on 05/11/2017 for 90 day supplies and 2 refills to [facility] on file.

## 2017-07-07 NOTE — Telephone Encounter (Signed)
Spoke with patient and let him know refills were sent in on 05/11/17 for 90 day supplies and 2 refills.

## 2017-07-30 ENCOUNTER — Other Ambulatory Visit: Payer: Self-pay | Admitting: Internal Medicine

## 2017-08-18 DIAGNOSIS — M542 Cervicalgia: Secondary | ICD-10-CM | POA: Diagnosis not present

## 2017-08-18 DIAGNOSIS — M47812 Spondylosis without myelopathy or radiculopathy, cervical region: Secondary | ICD-10-CM | POA: Diagnosis not present

## 2017-08-23 DIAGNOSIS — M47812 Spondylosis without myelopathy or radiculopathy, cervical region: Secondary | ICD-10-CM | POA: Diagnosis not present

## 2017-09-20 NOTE — Progress Notes (Signed)
Cardiology Office Note    Date:  09/28/2017   ID:  [name], DOB 05/30/1936, MRN [id number]  PCP:  Colon Branch, MD  Cardiologist:  Dr. Johnsie Cancel  CC: 3 week follow up   History of Present Illness:  [name] is a 81 y.o. male with a history of CAD s/p DES to LCx (2006), carotid artery disease, HTN, HLD who presents to clinic for follow up.   He  had a normal nuclear stress test in 03/2016 prior to back surgery.   He was seen by me  08/12/16. He complained of dependant edema in RLE where he had TKA 3 years prior. His lasix was increased to 40mg  in AM and 20mg  in PM.  And then reduced back to daily with improvement   Today he presents to clinic for follow up. No chest pain or SOB. LE edema is  better. No orthopnea or PND. No dizziness or syncope. He is feeling quite well. Has some back and neck pain Wife wanted to know if he Could use mechanical massager he got for Christmas     Past Medical History:  Diagnosis Date  . Arthritis   . BPH (benign prostatic hypertrophy)   . Carotid artery disease ([place])    Doppler, December 08, 2011, 00 40% R. ICA, 98-11% LICA, followup 1 year  . Coronary artery disease    DES Circumflex or CVA 2006  /  clear, February, 2011, EF 70%, no ischemia or  . Ejection fraction    EF 60%, echo, 2009, mildly calcified aortic leaflets  . History of colonic polyps   . Hyperlipidemia   . Hypertension   . Hypothyroidism   . Lumbar radiculopathy   . Multiple thyroid nodules    Avascular echogenic areas noted in the right thyroid at the time of carotid Doppler.   Marland Kitchen PONV (postoperative nausea and vomiting)    "nausea with first hip surgery"  . Skin cancer (melanoma) (HCC)    PMH of   . Spinal stenosis, lumbar   . Tremor    Fine tremor right upper extremity  . Venous insufficiency     Past Surgical History:  Procedure Laterality Date  . COLONOSCOPY    . CORONARY ANGIOPLASTY WITH STENT PLACEMENT  2006   x 2 stents; DES to CX and dRCA '06  . KNEE  SURGERY  1970's   "chipped bone"  . LUMBAR LAMINECTOMY/DECOMPRESSION MICRODISCECTOMY Left 07/13/2016   Procedure: LUMBAR LAMINECTOMY AND FORAMINOTOMY Lumbar two, three, Lumbar three-four, Lumbar four-five, LEFT Lumbar five-Sacral one DISECTOMY;  Surgeon: Newman Pies, MD;  Location: [facility];  Service: Neurosurgery;  Laterality: Left;  . TOE SURGERY  1997  . TONSILLECTOMY    . TOTAL HIP ARTHROPLASTY Left 2007  . TOTAL HIP ARTHROPLASTY Right 2010  . TOTAL KNEE ARTHROPLASTY Right 06/25/2014   Procedure: RIGHT TOTAL KNEE ARTHROPLASTY;  Surgeon: Gearlean Alf, MD;  Location: [facility];  Service: Orthopedics;  Laterality: Right;    Current Medications: Outpatient Medications Prior to Visit  Medication Sig Dispense Refill  . aspirin 81 MG tablet Take 1 tablet (81 mg total) by mouth daily.    Marland Kitchen atorvastatin (LIPITOR) 40 MG tablet Take 0.5 tablets (20 mg total) by mouth at bedtime. 45 tablet 2  . benazepril (LOTENSIN) 20 MG tablet Take 1.5 tablets (30 mg total) by mouth every morning. 135 tablet 2  . diltiazem (DILT-XR) 180 MG 24 hr capsule Take 1 capsule (180 mg total) by mouth daily. [employer]  capsule 2  . furosemide (LASIX) 40 MG tablet Take 1 tablet by mouth daily 90 tablet 3  . levothyroxine (SYNTHROID, LEVOTHROID) 200 MCG tablet Take 1 tablet (200 mcg total) by mouth daily before breakfast. 90 tablet 1  . LUMIGAN 0.01 % SOLN Place 1 drop into both eyes at bedtime.    . metoprolol tartrate (LOPRESSOR) 25 MG tablet Take 0.5 tablets (12.5 mg total) by mouth 2 (two) times daily. 90 tablet 2  . nitroGLYCERIN (NITROSTAT) 0.4 MG SL tablet PLACE 1 TABLET UNDER THE TONGUE EVERY 5 MINUTES AS NEEDED 75 tablet 1  . Timolol Maleate 0.5 % (DAILY) SOLN Apply 1 drop to eye daily.    Marland Kitchen amoxicillin (AMOXIL) 500 MG tablet Take 2,000 mg by mouth every hour as needed (For dental appts).     No facility-administered medications prior to visit.      Allergies:   Amlodipine besylate   Social History   Socioeconomic  History  . Marital status: Married    Spouse name: None  . Number of children: 2  . Years of education: None  . Highest education level: None  Social Needs  . Financial resource strain: None  . Food insecurity - worry: None  . Food insecurity - inability: None  . Transportation needs - medical: None  . Transportation needs - non-medical: None  Occupational History  . Occupation: retired, Nurse, learning disability  Tobacco Use  . Smoking status: Former Smoker    Types: Cigarettes    Last attempt to quit: 09/21/1984    Years since quitting: 33.0  . Smokeless tobacco: Never Used  . Tobacco comment: smoked 1958-1986, up to 1 ppd  Substance and Sexual Activity  . Alcohol use: Yes    Comment: socially on occasion  . Drug use: No  . Sexual activity: None  Other Topics Concern  . None  Social History Narrative   2 daughters, one is a Therapist, sports @ [employer]     Family History:  The patient's family history includes Colon cancer (age of onset: 63) in his father; Coronary artery disease in his mother; Diabetes in his maternal grandfather and sister; Heart attack (age of onset: 86) in his sister; Heart attack (age of onset: 99) in his maternal grandfather; Hypertension in his father; Pancreatitis in his father.     ROS:   Please see the history of present illness.    ROS All other systems reviewed and are negative.   PHYSICAL EXAM:   VS:  BP 130/72   Pulse 64   Ht 5\' 9"  (1.753 m)   Wt 185 lb 8 oz (84.1 kg)   SpO2 95%   BMI 27.39 kg/m    Affect appropriate Healthy:  appears stated age 4: normal Neck supple with no adenopathy JVP normal no bruits no thyromegaly Lungs clear with no wheezing and good diaphragmatic motion Heart:  S1/S2 no murmur, no rub, gallop or click PMI normal Abdomen: benighn, BS positve, no tenderness, no AAA no bruit.  No HSM or HJR Distal pulses intact with no bruits No edema Neuro non-focal Skin warm and dry Post right TKR  Post lumbar  laminectomy    Wt Readings from Last 3 Encounters:  09/28/17 185 lb 8 oz (84.1 kg)  05/11/17 191 lb 2 oz (86.7 kg)  03/03/17 191 lb (86.6 kg)      Studies/Labs Reviewed:   EKG:  03/03/17   NSR rate 60 PAC LAFB stable   Recent Labs: 05/11/2017: ALT 16; BUN  19; Creatinine, Ser 1.03; Hemoglobin 16.0; Platelets 240.0; Potassium 4.0; Sodium 140; TSH 0.32   Lipid Panel    Component Value Date/Time   CHOL 136 11/04/2016 1134   CHOL 161 10/31/2014 0824   TRIG 82.0 11/04/2016 1134   TRIG 81 10/31/2014 0824   TRIG 86 07/23/2006 1012   HDL 46.20 11/04/2016 1134   HDL 49 10/31/2014 0824   CHOLHDL 3 11/04/2016 1134   VLDL 16.4 11/04/2016 1134   LDLCALC 73 11/04/2016 1134   LDLCALC 96 10/31/2014 0824    Additional studies/ records that were reviewed today include:  03/2016 Myoview  Study Highlights   Nuclear stress EF: 62%. Normal LV function  There was no ST segment deviation noted during stress.  The study is normal. There is no evidence of ischemia or previous infarction.  This is a low risk study.     Carotid dopplers 12/2015 Stable carotid artery disease over serial exams. 1-39% bilateral ICA stenosis. Patent vertebral arteries with antegrade flow. Normal subclavian arteries, bilaterally. Abnormal right thyroid appearance.   ASSESSMENT & PLAN:   LE edema: better  Continue daily lasix related to TKR and varicosities   CAD: DES to circumflex 2006  stable with low risk Myoview 03/31/16 . Continue ASA, statin and BB  Carotid artery disease: stable doppler study in 12/2015 Plaque no significant stenosis . Repeat due in 2 years ( 12/2017 )  HLD: continue statin. Followed by Dr. Larose Kells  Hypothyroidism: continue synthroid followed by Dr. Irma Newness:  Post right TKR and lumbar surgery     Jenkins Rouge

## 2017-09-28 ENCOUNTER — Ambulatory Visit: Payer: Medicare Other | Admitting: Cardiovascular Disease

## 2017-09-28 ENCOUNTER — Encounter: Payer: Self-pay | Admitting: Cardiovascular Disease

## 2017-09-28 VITALS — BP 130/72 | HR 64 | Ht 69.0 in | Wt 185.5 lb

## 2017-09-28 DIAGNOSIS — I6523 Occlusion and stenosis of bilateral carotid arteries: Secondary | ICD-10-CM

## 2017-09-28 DIAGNOSIS — I25810 Atherosclerosis of coronary artery bypass graft(s) without angina pectoris: Secondary | ICD-10-CM

## 2017-09-28 DIAGNOSIS — E785 Hyperlipidemia, unspecified: Secondary | ICD-10-CM

## 2017-09-28 DIAGNOSIS — I779 Disorder of arteries and arterioles, unspecified: Secondary | ICD-10-CM

## 2017-09-28 DIAGNOSIS — I10 Essential (primary) hypertension: Secondary | ICD-10-CM | POA: Diagnosis not present

## 2017-09-28 DIAGNOSIS — I739 Peripheral vascular disease, unspecified: Secondary | ICD-10-CM

## 2017-09-28 NOTE — Patient Instructions (Addendum)

## 2017-10-19 DIAGNOSIS — I10 Essential (primary) hypertension: Secondary | ICD-10-CM | POA: Diagnosis not present

## 2017-10-19 DIAGNOSIS — M542 Cervicalgia: Secondary | ICD-10-CM | POA: Diagnosis not present

## 2017-10-20 DIAGNOSIS — L818 Other specified disorders of pigmentation: Secondary | ICD-10-CM | POA: Diagnosis not present

## 2017-10-20 DIAGNOSIS — Z08 Encounter for follow-up examination after completed treatment for malignant neoplasm: Secondary | ICD-10-CM | POA: Diagnosis not present

## 2017-10-20 DIAGNOSIS — Z85820 Personal history of malignant melanoma of skin: Secondary | ICD-10-CM | POA: Diagnosis not present

## 2017-10-20 DIAGNOSIS — L570 Actinic keratosis: Secondary | ICD-10-CM | POA: Diagnosis not present

## 2017-10-20 DIAGNOSIS — D225 Melanocytic nevi of trunk: Secondary | ICD-10-CM | POA: Diagnosis not present

## 2017-10-20 DIAGNOSIS — C4441 Basal cell carcinoma of skin of scalp and neck: Secondary | ICD-10-CM | POA: Diagnosis not present

## 2017-10-20 DIAGNOSIS — Z1283 Encounter for screening for malignant neoplasm of skin: Secondary | ICD-10-CM | POA: Diagnosis not present

## 2017-10-20 DIAGNOSIS — D485 Neoplasm of uncertain behavior of skin: Secondary | ICD-10-CM | POA: Diagnosis not present

## 2017-11-03 ENCOUNTER — Encounter: Payer: Self-pay | Admitting: Internal Medicine

## 2017-11-03 ENCOUNTER — Ambulatory Visit: Payer: Medicare Other | Admitting: Internal Medicine

## 2017-11-03 VITALS — BP 132/74 | HR 56 | Temp 98.4°F | Resp 14 | Ht 69.0 in | Wt 188.5 lb

## 2017-11-03 DIAGNOSIS — R739 Hyperglycemia, unspecified: Secondary | ICD-10-CM

## 2017-11-03 DIAGNOSIS — R251 Tremor, unspecified: Secondary | ICD-10-CM

## 2017-11-03 DIAGNOSIS — E782 Mixed hyperlipidemia: Secondary | ICD-10-CM | POA: Diagnosis not present

## 2017-11-03 DIAGNOSIS — E039 Hypothyroidism, unspecified: Secondary | ICD-10-CM | POA: Diagnosis not present

## 2017-11-03 DIAGNOSIS — I10 Essential (primary) hypertension: Secondary | ICD-10-CM

## 2017-11-03 LAB — BASIC METABOLIC PANEL
BUN: 17 mg/dL (ref 6–23)
CO2: 30 meq/L (ref 19–32)
Calcium: 9.5 mg/dL (ref 8.4–10.5)
Chloride: 106 mEq/L (ref 96–112)
Creatinine, Ser: 0.99 mg/dL (ref 0.40–1.50)
GFR: 77.38 mL/min (ref >60.00)
GLUCOSE: 88 mg/dL (ref 70–99)
POTASSIUM: 3.9 meq/L (ref 3.5–5.1)
SODIUM: 143 meq/L (ref 135–145)

## 2017-11-03 LAB — LIPID PANEL
CHOL/HDL RATIO: 3
Cholesterol: 144 mg/dL (ref 0–200)
HDL: 46.3 mg/dL (ref >39.00)
LDL CALC: 79 mg/dL (ref 0–99)
NONHDL: 97.37
Triglycerides: 93 mg/dL (ref 0.0–149.0)
VLDL: 18.6 mg/dL (ref 0.0–40.0)

## 2017-11-03 LAB — TSH: TSH: 0.06 u[IU]/mL — AB (ref 0.35–4.50)

## 2017-11-03 MED ORDER — LEVOTHYROXINE SODIUM 150 MCG PO TABS: 150.0000 ug | ORAL_TABLET | Freq: Every day | ORAL | 3 refills | Status: DC

## 2017-11-03 NOTE — Patient Instructions (Signed)
GO TO THE LAB : Get the blood work     GO TO THE FRONT DESK Schedule your next appointment for a  physical exam in 6 months  

## 2017-11-03 NOTE — Progress Notes (Signed)
Subjective:    Patient ID: [name], male    DOB: [birth year], 82 y.o.   MRN: [id number]  DOS:  11/03/2017 Type of visit - description : ROV Interval history: CAD: Note from cardiology reviewed HTN: Good compliance of medication, no ambulatory BPs. High cholesterol: Good compliance with medications, due for labs  Wt Readings from Last 3 Encounters:  11/03/17 188 lb 8 oz (85.5 kg)  09/28/17 185 lb 8 oz (84.1 kg)  05/11/17 191 lb 2 oz (86.7 kg)     BP Readings from Last 3 Encounters:  11/03/17 132/74  09/28/17 130/72  05/11/17 126/70     Review of Systems Denies chest pain, difficulty breathing.  Has lower extremity edema, at baseline. Has lost weight, on purpose, trying to eat healthier, stays active. History of tremors, not an issue at this point  Past Medical History:  Diagnosis Date  . Arthritis   . BPH (benign prostatic hypertrophy)   . Carotid artery disease ([place])    Doppler, December 08, 2011, 00 70% R. ICA, 01-74% LICA, followup 1 year  . Coronary artery disease    DES Circumflex or CVA 2006  /  clear, February, 2011, EF 70%, no ischemia or  . Ejection fraction    EF 60%, echo, 2009, mildly calcified aortic leaflets  . History of colonic polyps   . Hyperlipidemia   . Hypertension   . Hypothyroidism   . Lumbar radiculopathy   . Multiple thyroid nodules    Avascular echogenic areas noted in the right thyroid at the time of carotid Doppler.   Marland Kitchen PONV (postoperative nausea and vomiting)    "nausea with first hip surgery"  . Skin cancer (melanoma) (HCC)    PMH of   . Spinal stenosis, lumbar   . Tremor    Fine tremor right upper extremity  . Venous insufficiency     Past Surgical History:  Procedure Laterality Date  . COLONOSCOPY    . CORONARY ANGIOPLASTY WITH STENT PLACEMENT  2006   x 2 stents; DES to CX and dRCA '06  . KNEE SURGERY  1970's   "chipped bone"  . LUMBAR LAMINECTOMY/DECOMPRESSION MICRODISCECTOMY Left 07/13/2016   Procedure: LUMBAR  LAMINECTOMY AND FORAMINOTOMY Lumbar two, three, Lumbar three-four, Lumbar four-five, LEFT Lumbar five-Sacral one DISECTOMY;  Surgeon: Newman Pies, MD;  Location: [facility];  Service: Neurosurgery;  Laterality: Left;  . TOE SURGERY  1997  . TONSILLECTOMY    . TOTAL HIP ARTHROPLASTY Left 2007  . TOTAL HIP ARTHROPLASTY Right 2010  . TOTAL KNEE ARTHROPLASTY Right 06/25/2014   Procedure: RIGHT TOTAL KNEE ARTHROPLASTY;  Surgeon: Gearlean Alf, MD;  [facility];  Service: Orthopedics;  Laterality: Right;    Social History   Socioeconomic History  . Marital status: Married    Spouse name: Not on file  . Number of children: 2  . Years of education: Not on file  . Highest education level: Not on file  Social Needs  . Financial resource strain: Not on file  . Food insecurity - worry: Not on file  . Food insecurity - inability: Not on file  . Transportation needs - medical: Not on file  . Transportation needs - non-medical: Not on file  Occupational History  . Occupation: retired, Nurse, learning disability  Tobacco Use  . Smoking status: Former Smoker    Types: Cigarettes    Last attempt to quit: 09/21/1984    Years since quitting: 33.1  . Smokeless tobacco: Never Used  .  Tobacco comment: smoked 1958-1986, up to 1 ppd  Substance and Sexual Activity  . Alcohol use: Yes    Comment: socially on occasion  . Drug use: No  . Sexual activity: Not on file  Other Topics Concern  . Not on file  Social History Narrative   2 daughters, one is a Therapist, sports @ [employer]      Allergies as of 11/03/2017      Reactions   Amlodipine Besylate Other (See Comments)   REACTION: edema      Medication List        Accurate as of 11/03/17 11:59 PM. Always use your most recent med list.          aspirin 81 MG tablet Take 1 tablet (81 mg total) by mouth daily.   atorvastatin 40 MG tablet Commonly known as:  LIPITOR Take 0.5 tablets (20 mg total) by mouth at bedtime.   benazepril 20 MG  tablet Commonly known as:  LOTENSIN Take 1.5 tablets (30 mg total) by mouth every morning.   diltiazem 180 MG 24 hr capsule Commonly known as:  DILT-XR Take 1 capsule (180 mg total) by mouth daily.   furosemide 40 MG tablet Commonly known as:  LASIX Take 1 tablet by mouth daily   levothyroxine 150 MCG tablet Commonly known as:  SYNTHROID, LEVOTHROID Take 1 tablet (150 mcg total) by mouth daily before breakfast.   LUMIGAN 0.01 % Soln Generic drug:  bimatoprost Place 1 drop into both eyes at bedtime.   metoprolol tartrate 25 MG tablet Commonly known as:  LOPRESSOR Take 0.5 tablets (12.5 mg total) by mouth 2 (two) times daily.   nitroGLYCERIN 0.4 MG SL tablet Commonly known as:  NITROSTAT PLACE 1 TABLET UNDER THE TONGUE EVERY 5 MINUTES AS NEEDED   Timolol Maleate 0.5 % (DAILY) Soln Apply 1 drop to eye daily.          Objective:   Physical Exam BP 132/74 (BP Location: Left Arm, Patient Position: Sitting, Cuff Size: Small)   Pulse (!) 56   Temp 98.4 F (36.9 C) (Oral)   Resp 14   Ht 5\' 9"  (1.753 m)   Wt 188 lb 8 oz (85.5 kg)   SpO2 96%   BMI 27.84 kg/m  General:   Well developed, well nourished . NAD.  HEENT:  Normocephalic . Face symmetric, atraumatic Lungs:  CTA B Normal respiratory effort, no intercostal retractions, no accessory muscle use. Heart: RRR,  no murmur.  + pitting edema, worse on the right.  Seems at baseline.   Skin: Not pale. Not jaundice Neurologic:  alert & oriented X3.  Speech normal, gait appropriate for age and unassisted Psych--  Cognition and judgment appear intact.  Cooperative with normal attention span and concentration.  Behavior appropriate. No anxious or depressed appearing.      Assessment & Plan:    Assessment  (Transfer from  Dr. Linna Darner [phone]) Diabetes, A1c 6.2 2013 HTN Hyperlipidemia Hypothyroidism  Thyroid US 2014-- no nodules, inhomogeneous  CV: --CAD: stents 2006;  low risk nuclear scan 2011, 03-2016   --Carotid artery disease [place] 12/2014:   Stable, 1-39% RICA stenosis. Stable, 83-38% LICA stenosis. [place] 12/2015: Next 2 years R leg edema, [place] (-)   DVT 08-2014 [facility] DJD: Dr Maureen Ralphs, back surgery Dr Arnoldo Morale 06-2016 Venous insufficiency, chronic R>L edema Tremor Glaucoma H/o skin cancer , denies Melanoma; sees derm, had a visit ~ 09/2017, Dr Nevada Crane  PLAN: DM: Last A1c satisfactory, he is doing better with diet  and exercise, has lost weight HTN: No ambulatory BPs, BP at the office is always very good.  Continue with Lotensin, diltiazem.  Check BMP High cholesterol: On Lipitor, check a lipid panel, did have a small amount of cereal today. Hypothyroidism: Good compliance with Synthroid, TSH have been slightly lower than goal.  Consider adjust dose of Synthroid. CAD, carotid artery disease.  Saw cardiology 09/28/2017.  Felt to be stable. H/o tremors, not an issue at this point. Had a flu shot RTC 6 months CPX

## 2017-11-03 NOTE — Progress Notes (Signed)
Pre visit review using our clinic review tool, if applicable. No additional management support is needed unless otherwise documented below in the visit note. 

## 2017-11-04 NOTE — Assessment & Plan Note (Signed)
DM: Last A1c satisfactory, he is doing better with diet and exercise, has lost weight HTN: No ambulatory BPs, BP at the office is always very good.  Continue with Lotensin, diltiazem.  Check BMP High cholesterol: On Lipitor, check a lipid panel, did have a small amount of cereal today. Hypothyroidism: Good compliance with Synthroid, TSH have been slightly lower than goal.  Consider adjust dose of Synthroid. CAD, carotid artery disease.  Saw cardiology 09/28/2017.  Felt to be stable. H/o tremors, not an issue at this point. Had a flu shot RTC 6 months CPX

## 2017-11-19 DIAGNOSIS — X32XXXD Exposure to sunlight, subsequent encounter: Secondary | ICD-10-CM | POA: Diagnosis not present

## 2017-11-19 DIAGNOSIS — Z08 Encounter for follow-up examination after completed treatment for malignant neoplasm: Secondary | ICD-10-CM | POA: Diagnosis not present

## 2017-11-19 DIAGNOSIS — Z85828 Personal history of other malignant neoplasm of skin: Secondary | ICD-10-CM | POA: Diagnosis not present

## 2017-11-19 DIAGNOSIS — L570 Actinic keratosis: Secondary | ICD-10-CM | POA: Diagnosis not present

## 2017-12-27 ENCOUNTER — Other Ambulatory Visit: Payer: Self-pay | Admitting: Internal Medicine

## 2017-12-27 DIAGNOSIS — I6523 Occlusion and stenosis of bilateral carotid arteries: Secondary | ICD-10-CM

## 2018-01-03 ENCOUNTER — Other Ambulatory Visit (INDEPENDENT_AMBULATORY_CARE_PROVIDER_SITE_OTHER): Payer: Medicare Other

## 2018-01-03 DIAGNOSIS — E039 Hypothyroidism, unspecified: Secondary | ICD-10-CM

## 2018-01-03 LAB — TSH: TSH: 1.77 u[IU]/mL (ref 0.35–4.50)

## 2018-01-04 MED ORDER — LEVOTHYROXINE SODIUM 150 MCG PO TABS: 150.0000 ug | ORAL_TABLET | Freq: Every day | ORAL | 5 refills | Status: DC

## 2018-01-10 ENCOUNTER — Ambulatory Visit (HOSPITAL_COMMUNITY)
Admission: RE | Admit: 2018-01-10 | Discharge: 2018-01-10 | Disposition: A | Discharge status: Home / Self Care | Payer: Medicare Other | Source: Ambulatory Visit | Attending: Cardiology | Admitting: Cardiology

## 2018-01-10 DIAGNOSIS — I2510 Atherosclerotic heart disease of native coronary artery without angina pectoris: Secondary | ICD-10-CM | POA: Diagnosis not present

## 2018-01-10 DIAGNOSIS — I6523 Occlusion and stenosis of bilateral carotid arteries: Secondary | ICD-10-CM | POA: Diagnosis not present

## 2018-01-10 DIAGNOSIS — I10 Essential (primary) hypertension: Secondary | ICD-10-CM | POA: Diagnosis not present

## 2018-01-10 DIAGNOSIS — E785 Hyperlipidemia, unspecified: Secondary | ICD-10-CM | POA: Insufficient documentation

## 2018-01-10 DIAGNOSIS — Z87891 Personal history of nicotine dependence: Secondary | ICD-10-CM | POA: Diagnosis not present

## 2018-01-24 ENCOUNTER — Telehealth: Payer: Self-pay | Admitting: Internal Medicine

## 2018-01-24 NOTE — Telephone Encounter (Signed)
Copied from [place] [id number]. Topic: Quick Communication - See Telephone Encounter >> Jan 24, 2018  8:49 AM Ether Griffins B wrote: CRM for notification. See Telephone encounter for: 01/24/18.  Pt is calling requesting lab work from April. He is wanting to verify if his medication (levothyroxine (SYNTHROID, LEVOTHROID) 150 MCG tablet) needs to stay the same as well or if it will change depending on the lab work

## 2018-01-24 NOTE — Telephone Encounter (Signed)
Notes recorded by Damita Dunnings, CMA on 01/04/2018 at 5:10 PM EDT Results released to [facility]. Refills sent to pharmacy. ------  Notes recorded by Colon Branch, MD on 01/04/2018 at 5:09 PM EDT Refill Synthroid times 6 months Release these results to MyChart with attached comments [name], your thyroid is now well-balanced, continue the same thyroid medication, we are sending a refill. Good results.

## 2018-02-09 DIAGNOSIS — C44212 Basal cell carcinoma of skin of right ear and external auricular canal: Secondary | ICD-10-CM | POA: Diagnosis not present

## 2018-02-09 DIAGNOSIS — X32XXXD Exposure to sunlight, subsequent encounter: Secondary | ICD-10-CM | POA: Diagnosis not present

## 2018-02-09 DIAGNOSIS — L570 Actinic keratosis: Secondary | ICD-10-CM | POA: Diagnosis not present

## 2018-02-11 DIAGNOSIS — H401134 Primary open-angle glaucoma, bilateral, indeterminate stage: Secondary | ICD-10-CM | POA: Diagnosis not present

## 2018-02-22 DIAGNOSIS — X32XXXD Exposure to sunlight, subsequent encounter: Secondary | ICD-10-CM | POA: Diagnosis not present

## 2018-02-22 DIAGNOSIS — C44212 Basal cell carcinoma of skin of right ear and external auricular canal: Secondary | ICD-10-CM | POA: Diagnosis not present

## 2018-02-22 DIAGNOSIS — L570 Actinic keratosis: Secondary | ICD-10-CM | POA: Diagnosis not present

## 2018-03-27 ENCOUNTER — Other Ambulatory Visit: Payer: Self-pay | Admitting: Internal Medicine

## 2018-03-27 DIAGNOSIS — R601 Generalized edema: Secondary | ICD-10-CM

## 2018-03-29 DIAGNOSIS — B078 Other viral warts: Secondary | ICD-10-CM | POA: Diagnosis not present

## 2018-03-29 DIAGNOSIS — C44212 Basal cell carcinoma of skin of right ear and external auricular canal: Secondary | ICD-10-CM | POA: Diagnosis not present

## 2018-03-29 DIAGNOSIS — X32XXXD Exposure to sunlight, subsequent encounter: Secondary | ICD-10-CM | POA: Diagnosis not present

## 2018-03-29 DIAGNOSIS — L570 Actinic keratosis: Secondary | ICD-10-CM | POA: Diagnosis not present

## 2018-03-30 ENCOUNTER — Other Ambulatory Visit: Payer: Self-pay | Admitting: Internal Medicine

## 2018-04-19 ENCOUNTER — Other Ambulatory Visit: Payer: Self-pay | Admitting: Internal Medicine

## 2018-04-30 ENCOUNTER — Other Ambulatory Visit: Payer: Self-pay | Admitting: Internal Medicine

## 2018-05-03 ENCOUNTER — Ambulatory Visit: Payer: Medicare Other | Admitting: Internal Medicine

## 2018-05-03 ENCOUNTER — Other Ambulatory Visit: Payer: Self-pay | Admitting: Internal Medicine

## 2018-05-11 ENCOUNTER — Ambulatory Visit: Payer: Medicare Other | Admitting: Internal Medicine

## 2018-05-11 ENCOUNTER — Encounter: Payer: Self-pay | Admitting: Internal Medicine

## 2018-05-11 VITALS — BP 126/64 | HR 64 | Temp 98.6°F | Resp 16 | Ht 69.0 in | Wt 194.0 lb

## 2018-05-11 DIAGNOSIS — R739 Hyperglycemia, unspecified: Secondary | ICD-10-CM

## 2018-05-11 DIAGNOSIS — E039 Hypothyroidism, unspecified: Secondary | ICD-10-CM | POA: Diagnosis not present

## 2018-05-11 DIAGNOSIS — I10 Essential (primary) hypertension: Secondary | ICD-10-CM | POA: Diagnosis not present

## 2018-05-11 DIAGNOSIS — R29810 Facial weakness: Secondary | ICD-10-CM | POA: Diagnosis not present

## 2018-05-11 LAB — CBC WITH DIFFERENTIAL/PLATELET
BASOS PCT: 1.9 % (ref 0.0–3.0)
Basophils Absolute: 0.1 10*3/uL (ref 0.0–0.1)
EOS ABS: 0.7 10*3/uL (ref 0.0–0.7)
EOS PCT: 10.3 % — AB (ref 0.0–5.0)
HCT: 47 % (ref 39.0–52.0)
HEMOGLOBIN: 15.5 g/dL (ref 13.0–17.0)
LYMPHS ABS: 1.4 10*3/uL (ref 0.7–4.0)
Lymphocytes Relative: 21.6 % (ref 12.0–46.0)
MCHC: 33 g/dL (ref 30.0–36.0)
MCV: 92.4 fl (ref 78.0–100.0)
MONO ABS: 0.8 10*3/uL (ref 0.1–1.0)
Monocytes Relative: 12.3 % — ABNORMAL HIGH (ref 3.0–12.0)
NEUTROS PCT: 53.9 % (ref 43.0–77.0)
Neutro Abs: 3.4 10*3/uL (ref 1.4–7.7)
PLATELETS: 221 10*3/uL (ref 150.0–400.0)
RBC: 5.08 Mil/uL (ref 4.22–5.81)
RDW: 15 % (ref 11.5–15.5)
WBC: 6.4 10*3/uL (ref 4.0–10.5)

## 2018-05-11 LAB — COMPREHENSIVE METABOLIC PANEL
ALBUMIN: 4.1 g/dL (ref 3.5–5.2)
ALT: 14 U/L (ref 0–53)
AST: 17 U/L (ref 0–37)
Alkaline Phosphatase: 61 U/L (ref 39–117)
BUN: 19 mg/dL (ref 6–23)
CHLORIDE: 106 meq/L (ref 96–112)
CO2: 29 mEq/L (ref 19–32)
CREATININE: 1.14 mg/dL (ref 0.40–1.50)
Calcium: 9.8 mg/dL (ref 8.4–10.5)
GFR: 65.67 mL/min (ref >60.00)
GLUCOSE: 81 mg/dL (ref 70–99)
POTASSIUM: 4.5 meq/L (ref 3.5–5.1)
SODIUM: 142 meq/L (ref 135–145)
TOTAL PROTEIN: 6.6 g/dL (ref 6.0–8.3)
Total Bilirubin: 0.6 mg/dL (ref 0.2–1.2)

## 2018-05-11 LAB — TSH: TSH: 1.95 u[IU]/mL (ref 0.35–4.50)

## 2018-05-11 LAB — HEMOGLOBIN A1C: HEMOGLOBIN A1C: 6 % (ref 4.6–6.5)

## 2018-05-11 NOTE — Progress Notes (Signed)
Subjective:    Patient ID: [name], male    DOB: 1936-06-04, 82 y.o.   MRN: [id number]  DOS:  05/11/2018 Type of visit - description : rov Interval history: Since the last visit he is feeling well HTN: Good compliance with medication, no ambulatory CBGs DM: Diet controlled, no ambulatory CBGs.  Lifestyle has improved. Had a carotid ultrasound since the last visit, results reviewed.  Review of Systems I noticed a very subtle right-sided facial weakness mostly at the corner of the mouth.  Patient was not aware of it.  Does not recall any recent signs or symptoms of stroke.  Specifically denies diplopia, headache, dizziness, slurred speech, motor deficits. Denies chest pain or difficulty breathing No nausea, vomiting, diarrhea.  Past Medical History:  Diagnosis Date  . Arthritis   . BPH (benign prostatic hypertrophy)   . Carotid artery disease ([place])    Doppler, December 08, 2011, 00 67% R. ICA, 12-45% LICA, followup 1 year  . Coronary artery disease    DES Circumflex or CVA 2006  /  clear, February, 2011, EF 70%, no ischemia or  . Ejection fraction    EF 60%, echo, 2009, mildly calcified aortic leaflets  . History of colonic polyps   . Hyperlipidemia   . Hypertension   . Hypothyroidism   . Lumbar radiculopathy   . Multiple thyroid nodules    Avascular echogenic areas noted in the right thyroid at the time of carotid Doppler.   Marland Kitchen PONV (postoperative nausea and vomiting)    "nausea with first hip surgery"  . Skin cancer (melanoma) (HCC)    PMH of   . Spinal stenosis, lumbar   . Tremor    Fine tremor right upper extremity  . Venous insufficiency     Past Surgical History:  Procedure Laterality Date  . COLONOSCOPY    . CORONARY ANGIOPLASTY WITH STENT PLACEMENT  2006   x 2 stents; DES to CX and dRCA '06  . KNEE SURGERY  1970's   "chipped bone"  . LUMBAR LAMINECTOMY/DECOMPRESSION MICRODISCECTOMY Left 07/13/2016   Procedure: LUMBAR LAMINECTOMY AND FORAMINOTOMY Lumbar  two, three, Lumbar three-four, Lumbar four-five, LEFT Lumbar five-Sacral one DISECTOMY;  Surgeon: Newman Pies, MD;  Location: [facility];  Service: Neurosurgery;  Laterality: Left;  . TOE SURGERY  1997  . TONSILLECTOMY    . TOTAL HIP ARTHROPLASTY Left 2007  . TOTAL HIP ARTHROPLASTY Right 2010  . TOTAL KNEE ARTHROPLASTY Right 06/25/2014   Procedure: RIGHT TOTAL KNEE ARTHROPLASTY;  Surgeon: Gearlean Alf, MD;  Location[facility];  Service: Orthopedics;  Laterality: Right;    Social History   Socioeconomic History  . Marital status: Married    Spouse name: Not on file  . Number of children: 2  . Years of education: Not on file  . Highest education level: Not on file  Occupational History  . Occupation: retired, Nurse, learning disability  Social Needs  . Financial resource strain: Not on file  . Food insecurity:    Worry: Not on file    Inability: Not on file  . Transportation needs:    Medical: Not on file    Non-medical: Not on file  Tobacco Use  . Smoking status: Former Smoker    Types: Cigarettes    Last attempt to quit: 09/21/1984    Years since quitting: 33.6  . Smokeless tobacco: Never Used  . Tobacco comment: smoked 1958-1986, up to 1 ppd  Substance and Sexual Activity  . Alcohol  use: Yes    Comment: socially on occasion  . Drug use: No  . Sexual activity: Not on file  Lifestyle  . Physical activity:    Days per week: Not on file    Minutes per session: Not on file  . Stress: Not on file  Relationships  . Social connections:    Talks on phone: Not on file    Gets together: Not on file    Attends religious service: Not on file    Active member of club or organization: Not on file    Attends meetings of clubs or organizations: Not on file    Relationship status: Not on file  . Intimate partner violence:    Fear of current or ex partner: Not on file    Emotionally abused: Not on file    Physically abused: Not on file    Forced sexual activity: Not on  file  Other Topics Concern  . Not on file  Social History Narrative   2 daughters, one is a Therapist, sports @ [employer]      Allergies as of 05/11/2018      Reactions   Amlodipine Besylate Other (See Comments)   REACTION: edema      Medication List        Accurate as of 05/11/18 11:00 AM. Always use your most recent med list.          aspirin 81 MG tablet Take 1 tablet (81 mg total) by mouth daily.   atorvastatin 40 MG tablet Commonly known as:  LIPITOR Take 0.5 tablets (20 mg total) by mouth at bedtime.   benazepril 20 MG tablet Commonly known as:  LOTENSIN Take 1.5 tablets (30 mg total) by mouth daily.   diltiazem 180 MG 24 hr capsule Commonly known as:  DILACOR XR Take 1 capsule (180 mg total) by mouth daily.   furosemide 40 MG tablet Commonly known as:  LASIX Take 1 tablet (40 mg total) by mouth daily.   levothyroxine 150 MCG tablet Commonly known as:  SYNTHROID, LEVOTHROID Take 1 tablet (150 mcg total) by mouth daily before breakfast.   LUMIGAN 0.01 % Soln Generic drug:  bimatoprost Place 1 drop into both eyes at bedtime.   metoprolol tartrate 25 MG tablet Commonly known as:  LOPRESSOR Take 0.5 tablets (12.5 mg total) by mouth 2 (two) times daily.   nitroGLYCERIN 0.4 MG SL tablet Commonly known as:  NITROSTAT PLACE 1 TABLET UNDER THE TONGUE EVERY 5 MINUTES AS NEEDED   Timolol Maleate 0.5 % (DAILY) Soln Apply 1 drop to eye daily.          Objective:   Physical Exam BP 126/64 (BP Location: Left Arm, Patient Position: Sitting, Cuff Size: Small)   Pulse 64   Temp 98.6 F (37 C) (Oral)   Resp 16   Ht 5\' 9"  (1.753 m)   Wt 194 lb (88 kg)   SpO2 97%   BMI 28.65 kg/m  General:   Well developed, NAD, see BMI.  HEENT:  Normocephalic . Face symmetric, atraumatic Lungs:  CTA B Normal respiratory effort, no intercostal retractions, no accessory muscle use. Heart: RRR,  no murmur.  +/++ pretibial edema bilaterally R>L (baseline) Skin: Not pale. Not  jaundice Neurologic:  alert & oriented X3.  Speech normal, gait appropriate for age and unassisted. corner of the mouth is slightly droopy on the R .  Otherwise motor exam of the face is normal. Motor symmetric. Psych--  Cognition and judgment appear intact.  Cooperative with normal attention span and concentration.  Behavior appropriate. No anxious or depressed appearing.      Assessment & Plan:   Assessment  (Transfer from  Dr. Linna Darner [phone]) Diabetes, A1c 6.2 2013 HTN Hyperlipidemia Hypothyroidism  Thyroid US 2014-- no nodules, inhomogeneous  CV: --CAD: stents 2006;  low risk nuclear scan 2011, 03-2016  --Carotid artery disease [place] 12/2014:   Stable, 1-39% RICA stenosis. Stable, 70-48% LICA stenosis. [place] 12/2015: Next 2 years R leg edema, [place] (-)   DVT 08-2014 [facility] DJD: Dr Maureen Ralphs, back surgery Dr Arnoldo Morale 06-2016 Venous insufficiency, chronic R>L edema Tremor Glaucoma H/o skin cancer , denies Melanoma; sees derm, had a visit ~ 09/2017, Dr Nevada Crane  PLAN: DM: Diet controlled, since the last visit has improve his diet.  Check a A1c HTN: Continue Lotensin, diltiazem, Lasix, Lopressor.  Check CMP and CBC Hyperlipidemia: Well-controlled per last FLP.  On Lipitor Carotid artery disease: Last carotid ultrasound was  Stable 12/2017, recheck in 1 year Facial motor deficit: See physical exam, very subtle finding, no recent symptoms or signs of stroke.  He is high risk, will get MRI of the brain. Hypothyroidism: Check a TSH, continue Synthroid RTC 4 months

## 2018-05-11 NOTE — Progress Notes (Signed)
Pre visit review using our clinic review tool, if applicable. No additional management support is needed unless otherwise documented below in the visit note. 

## 2018-05-11 NOTE — Assessment & Plan Note (Signed)
DM: Diet controlled, since the last visit has improve his diet.  Check a A1c HTN: Continue Lotensin, diltiazem, Lasix, Lopressor.  Check CMP and CBC Hyperlipidemia: Well-controlled per last FLP.  On Lipitor Carotid artery disease: Last carotid ultrasound was  Stable 12/2017, recheck in 1 year Facial motor deficit: See physical exam, very subtle finding, no recent symptoms or signs of stroke.  He is high risk, will get MRI of the brain. Hypothyroidism: Check a TSH, continue Synthroid RTC 4 months

## 2018-05-11 NOTE — Patient Instructions (Signed)
GO TO THE LAB : Get the blood work     GO TO THE FRONT DESK Schedule your next appointment for a checkup in 4 months   We are ordering MRI of the brain, expect a phone call

## 2018-05-14 ENCOUNTER — Ambulatory Visit (HOSPITAL_BASED_OUTPATIENT_CLINIC_OR_DEPARTMENT_OTHER)
Admission: RE | Admit: 2018-05-14 | Discharge: 2018-05-14 | Disposition: A | Discharge status: Home / Self Care | Payer: Medicare Other | Source: Ambulatory Visit | Attending: Internal Medicine | Admitting: Internal Medicine

## 2018-05-14 DIAGNOSIS — R29818 Other symptoms and signs involving the nervous system: Secondary | ICD-10-CM | POA: Diagnosis not present

## 2018-05-14 DIAGNOSIS — G9389 Other specified disorders of brain: Secondary | ICD-10-CM | POA: Insufficient documentation

## 2018-05-14 DIAGNOSIS — R29810 Facial weakness: Secondary | ICD-10-CM | POA: Insufficient documentation

## 2018-05-14 DIAGNOSIS — I6782 Cerebral ischemia: Secondary | ICD-10-CM | POA: Diagnosis not present

## 2018-07-16 ENCOUNTER — Other Ambulatory Visit: Payer: Self-pay | Admitting: Internal Medicine

## 2018-08-16 DIAGNOSIS — H31003 Unspecified chorioretinal scars, bilateral: Secondary | ICD-10-CM | POA: Diagnosis not present

## 2018-08-16 DIAGNOSIS — H401134 Primary open-angle glaucoma, bilateral, indeterminate stage: Secondary | ICD-10-CM | POA: Diagnosis not present

## 2018-08-16 DIAGNOSIS — H52203 Unspecified astigmatism, bilateral: Secondary | ICD-10-CM | POA: Diagnosis not present

## 2018-08-16 DIAGNOSIS — H2513 Age-related nuclear cataract, bilateral: Secondary | ICD-10-CM | POA: Diagnosis not present

## 2018-08-22 ENCOUNTER — Other Ambulatory Visit: Payer: Self-pay | Admitting: *Deleted

## 2018-08-22 ENCOUNTER — Telehealth: Payer: Self-pay | Admitting: Cardiovascular Disease

## 2018-08-22 MED ORDER — NITROGLYCERIN 0.4 MG SL SUBL: SUBLINGUAL_TABLET | SUBLINGUAL | 0 refills | Status: DC

## 2018-08-22 NOTE — Telephone Encounter (Signed)
New Message ° ° ° °*STAT* If patient is at the pharmacy, call can be transferred to refill team. ° ° °1. Which medications need to be refilled? (please list name of each medication and dose if known) nitroGLYCERIN (NITROSTAT) 0.4 MG SL tablet ° °2. Which pharmacy/location (including street and city if local pharmacy) is medication to be sent to? [id number] - [place], [place] - [address] ° °3. Do they need a 30 day or 90 day supply? 30 ° ° °

## 2018-09-07 ENCOUNTER — Ambulatory Visit: Payer: Medicare Other | Admitting: Internal Medicine

## 2018-09-07 ENCOUNTER — Encounter: Payer: Self-pay | Admitting: Internal Medicine

## 2018-09-07 VITALS — BP 126/80 | HR 69 | Temp 97.5°F | Resp 16 | Ht 69.0 in | Wt 193.4 lb

## 2018-09-07 DIAGNOSIS — Z23 Encounter for immunization: Secondary | ICD-10-CM | POA: Diagnosis not present

## 2018-09-07 DIAGNOSIS — R739 Hyperglycemia, unspecified: Secondary | ICD-10-CM | POA: Diagnosis not present

## 2018-09-07 DIAGNOSIS — E039 Hypothyroidism, unspecified: Secondary | ICD-10-CM | POA: Diagnosis not present

## 2018-09-07 DIAGNOSIS — I10 Essential (primary) hypertension: Secondary | ICD-10-CM

## 2018-09-07 DIAGNOSIS — E782 Mixed hyperlipidemia: Secondary | ICD-10-CM | POA: Diagnosis not present

## 2018-09-07 MED ORDER — ZOSTER VAC RECOMB ADJUVANTED 50 MCG/0.5ML IM SUSR: 0.5000 mL | Freq: Once | INTRAMUSCULAR | 1 refills | Status: AC

## 2018-09-07 NOTE — Assessment & Plan Note (Signed)
DM, HTN, hyperlipidemia, hypothyroidism: Continue with Lipitor, Lotensin, diltiazem, Lasix, Synthroid.  All previous labs reviewed.  All okay. Facial paresis: Very subtle finding, MRI of the brain negative, no stroke symptoms.  Observation Preventive care: Flu shot today We had a long conversation about pros cons of Shingrix, prescription issued.  He will think about it RTC 4 months, CPX fasting

## 2018-09-07 NOTE — Progress Notes (Signed)
Subjective:    Patient ID: [name], male    DOB: July 04, 1936, 82 y.o.   MRN: [id number]  DOS:  09/07/2018 Type of visit - description : Routine checkup  Since the last office visit he is doing well. No concerns. Good compliance with her medications.  Review of Systems  Denies chest pain or difficulty breathing No nausea or vomiting No headache, dizziness, slurred speech.  Past Medical History:  Diagnosis Date  . Arthritis   . BPH (benign prostatic hypertrophy)   . Carotid artery disease ([place])    Doppler, December 08, 2011, 00 46% R. ICA, 96-29% LICA, followup 1 year  . Coronary artery disease    DES Circumflex or CVA 2006  /  clear, February, 2011, EF 70%, no ischemia or  . Ejection fraction    EF 60%, echo, 2009, mildly calcified aortic leaflets  . History of colonic polyps   . Hyperlipidemia   . Hypertension   . Hypothyroidism   . Lumbar radiculopathy   . Multiple thyroid nodules    Avascular echogenic areas noted in the right thyroid at the time of carotid Doppler.   Marland Kitchen PONV (postoperative nausea and vomiting)    "nausea with first hip surgery"  . Skin cancer (melanoma) (HCC)    PMH of   . Spinal stenosis, lumbar   . Tremor    Fine tremor right upper extremity  . Venous insufficiency     Past Surgical History:  Procedure Laterality Date  . COLONOSCOPY    . CORONARY ANGIOPLASTY WITH STENT PLACEMENT  2006   x 2 stents; DES to CX and dRCA '06  . KNEE SURGERY  1970's   "chipped bone"  . LUMBAR LAMINECTOMY/DECOMPRESSION MICRODISCECTOMY Left 07/13/2016   Procedure: LUMBAR LAMINECTOMY AND FORAMINOTOMY Lumbar two, three, Lumbar three-four, Lumbar four-five, LEFT Lumbar five-Sacral one DISECTOMY;  Surgeon: Newman Pies, MD;  Location: [facility];  Service: Neurosurgery;  Laterality: Left;  . TOE SURGERY  1997  . TONSILLECTOMY    . TOTAL HIP ARTHROPLASTY Left 2007  . TOTAL HIP ARTHROPLASTY Right 2010  . TOTAL KNEE ARTHROPLASTY Right 06/25/2014   Procedure: RIGHT  TOTAL KNEE ARTHROPLASTY;  Surgeon: Gearlean Alf, MD;  Location: [facility];  Service: Orthopedics;  Laterality: Right;    Social History   Socioeconomic History  . Marital status: Married    Spouse name: Not on file  . Number of children: 2  . Years of education: Not on file  . Highest education level: Not on file  Occupational History  . Occupation: retired, Nurse, learning disability  Social Needs  . Financial resource strain: Not on file  . Food insecurity:    Worry: Not on file    Inability: Not on file  . Transportation needs:    Medical: Not on file    Non-medical: Not on file  Tobacco Use  . Smoking status: Former Smoker    Types: Cigarettes    Last attempt to quit: 09/21/1984    Years since quitting: 33.9  . Smokeless tobacco: Never Used  . Tobacco comment: smoked 1958-1986, up to 1 ppd  Substance and Sexual Activity  . Alcohol use: Yes    Comment: socially on occasion  . Drug use: No  . Sexual activity: Not on file  Lifestyle  . Physical activity:    Days per week: Not on file    Minutes per session: Not on file  . Stress: Not on file  Relationships  . Social connections:  Talks on phone: Not on file    Gets together: Not on file    Attends religious service: Not on file    Active member of club or organization: Not on file    Attends meetings of clubs or organizations: Not on file    Relationship status: Not on file  . Intimate partner violence:    Fear of current or ex partner: Not on file    Emotionally abused: Not on file    Physically abused: Not on file    Forced sexual activity: Not on file  Other Topics Concern  . Not on file  Social History Narrative   2 daughters, one is a Therapist, sports @ [employer]      Allergies as of 09/07/2018      Reactions   Amlodipine Besylate Other (See Comments)   REACTION: edema      Medication List       Accurate as of September 07, 2018  6:08 PM. Always use your most recent med list.        aspirin 81 MG  tablet Take 1 tablet (81 mg total) by mouth daily.   atorvastatin 40 MG tablet Commonly known as:  LIPITOR Take 0.5 tablets (20 mg total) by mouth at bedtime.   benazepril 20 MG tablet Commonly known as:  LOTENSIN Take 1.5 tablets (30 mg total) by mouth daily.   diltiazem 180 MG 24 hr capsule Commonly known as:  DILT-XR Take 1 capsule (180 mg total) by mouth daily.   furosemide 40 MG tablet Commonly known as:  LASIX Take 1 tablet (40 mg total) by mouth daily.   levothyroxine 150 MCG tablet Commonly known as:  SYNTHROID, LEVOTHROID Take 1 tablet (150 mcg total) by mouth daily before breakfast.   LUMIGAN 0.01 % Soln Generic drug:  bimatoprost Place 1 drop into both eyes 2 (two) times daily.   metoprolol tartrate 25 MG tablet Commonly known as:  LOPRESSOR Take 0.5 tablets (12.5 mg total) by mouth 2 (two) times daily.   nitroGLYCERIN 0.4 MG SL tablet Commonly known as:  NITROSTAT PLACE 1 TABLET UNDER THE TONGUE EVERY 5 MINUTES AS NEEDED FOR CHEST PAIN   Timolol Maleate 0.5 % (DAILY) Soln Apply 1 drop to eye daily.   Zoster Vaccine Adjuvanted injection Commonly known as:  SHINGRIX Inject 0.5 mLs into the muscle once for 1 dose.           Objective:   Physical Exam BP 126/80 (BP Location: Left Arm, Patient Position: Sitting, Cuff Size: Normal)   Pulse 69   Temp (!) 97.5 F (36.4 C) (Oral)   Resp 16   Ht 5\' 9"  (1.753 m)   Wt 193 lb 6 oz (87.7 kg)   SpO2 96%   BMI 28.56 kg/m  General:   Well developed, NAD, BMI noted. HEENT:  Normocephalic . Face w/ subtle R paresis, atraumatic Lungs:  CTA B Normal respiratory effort, no intercostal retractions, no accessory muscle use. Heart: RRR,  no murmur.  No pretibial edema bilaterally  Skin: Not pale. Not jaundice Neurologic:  alert & oriented X3.  Speech normal, gait appropriate for age and unassisted.  Very subtle right-sided facial weakness Psych--  Cognition and judgment appear intact.  Cooperative with  normal attention span and concentration.  Behavior appropriate. No anxious or depressed appearing.      Assessment & Plan:    Assessment  (Transfer from  Dr. Linna Darner [phone]) Diabetes, A1c 6.2 2013 HTN Hyperlipidemia Hypothyroidism  Thyroid US 2014--  no nodules, inhomogeneous  CV: --CAD: stents 2006;  low risk nuclear scan 2011, 03-2016  --Carotid artery disease [place] 12/2014:   Stable, 1-39% RICA stenosis. Stable, 16-10% LICA stenosis. [place] 12/2015: Next 2 years R leg edema, [place] (-)   DVT 08-2014 [facility]: Dr Maureen Ralphs, back surgery Dr Arnoldo Morale 06-2016 Venous insufficiency, chronic R>L edema Tremor Glaucoma H/o skin cancer , denies Melanoma; sees derm, had a visit ~ 09/2017, Dr Nevada Crane  PLAN: DM, HTN, hyperlipidemia, hypothyroidism: Continue with Lipitor, Lotensin, diltiazem, Lasix, Synthroid.  All previous labs reviewed.  All okay. Facial paresis: Very subtle finding, MRI of the brain negative, no stroke symptoms.  Observation Preventive care: Flu shot today We had a long conversation about pros cons of Shingrix, prescription issued.  He will think about it RTC 4 months, CPX fasting

## 2018-09-07 NOTE — Patient Instructions (Addendum)
Please schedule Medicare Wellness with Glenard Haring.     GO TO THE FRONT DESK Schedule your next appointment for a  Physical exam in 4 months, fasting

## 2018-09-07 NOTE — Progress Notes (Signed)
Pre visit review using our clinic review tool, if applicable. No additional management support is needed unless otherwise documented below in the visit note. 

## 2018-09-09 DIAGNOSIS — C44319 Basal cell carcinoma of skin of other parts of face: Secondary | ICD-10-CM | POA: Diagnosis not present

## 2018-09-09 DIAGNOSIS — M1711 Unilateral primary osteoarthritis, right knee: Secondary | ICD-10-CM | POA: Diagnosis not present

## 2018-09-09 DIAGNOSIS — G8311 Monoplegia of lower limb affecting right dominant side: Secondary | ICD-10-CM | POA: Diagnosis not present

## 2018-09-14 ENCOUNTER — Other Ambulatory Visit: Payer: Self-pay | Admitting: Internal Medicine

## 2018-09-29 DIAGNOSIS — G8311 Monoplegia of lower limb affecting right dominant side: Secondary | ICD-10-CM | POA: Diagnosis not present

## 2018-10-04 NOTE — Progress Notes (Signed)
Cardiology Office Note    Date:  10/06/2018   ID:  [name], DOB 10-Dec-1935, MRN [id number]  PCP:  Colon Branch, MD  Cardiologist:  Dr. Johnsie Cancel  CC: 3 week follow up   History of Present Illness:  [name] is a 83 y.o. male with a history of CAD s/p DES to LCx (2006), carotid artery disease, HTN, HLD who presents to clinic for follow up. No angina compliant with meds Normal nuclear study 2017 prior to back surgery. LE Edema from varicosities and previous TKR takes lasix for this.   In office today found to be in new onset afib. He did not notice. No palpitations, mild exertional dyspnea chronic No chest pain.   Long discussion with [name] about afib and need for anticoagulation  This patients CHA2DS2-VASc Score and unadjusted Ischemic Stroke Rate (% per year) is equal to 4.8 % stroke rate/year from a score of 4  Above score calculated as 1 point each if present [CHF, HTN, DM, Vascular=MI/PAD/Aortic Plaque, Age if 65-74, or Male] Above score calculated as 2 points each if present [Age > 75, or Stroke/TIA/TE]  He is fairly asymptomatic and if his TTE shows normal EF and no severe valve disease I suspect rate control And anticoagulation will be best strategy I did discuss need for [facility] if TTE showed poor EF or valve disease Rate control on cardizem is fine    Past Medical History:  Diagnosis Date  . Arthritis   . BPH (benign prostatic hypertrophy)   . Carotid artery disease ([place])    Doppler, December 08, 2011, 00 94% R. ICA, 17-40% LICA, followup 1 year  . Coronary artery disease    DES Circumflex or CVA 2006  /  clear, February, 2011, EF 70%, no ischemia or  . Ejection fraction    EF 60%, echo, 2009, mildly calcified aortic leaflets  . History of colonic polyps   . Hyperlipidemia   . Hypertension   . Hypothyroidism   . Lumbar radiculopathy   . Multiple thyroid nodules    Avascular echogenic areas noted in the right thyroid at the time of carotid Doppler.   Marland Kitchen PONV  (postoperative nausea and vomiting)    "nausea with first hip surgery"  . Skin cancer (melanoma) (HCC)    PMH of   . Spinal stenosis, lumbar   . Tremor    Fine tremor right upper extremity  . Venous insufficiency     Past Surgical History:  Procedure Laterality Date  . COLONOSCOPY    . CORONARY ANGIOPLASTY WITH STENT PLACEMENT  2006   x 2 stents; DES to CX and dRCA '06  . KNEE SURGERY  1970's   "chipped bone"  . LUMBAR LAMINECTOMY/DECOMPRESSION MICRODISCECTOMY Left 07/13/2016   Procedure: LUMBAR LAMINECTOMY AND FORAMINOTOMY Lumbar two, three, Lumbar three-four, Lumbar four-five, LEFT Lumbar five-Sacral one DISECTOMY;  Surgeon: Newman Pies, MD;  Location: [facility];  Service: Neurosurgery;  Laterality: Left;  . TOE SURGERY  1997  . TONSILLECTOMY    . TOTAL HIP ARTHROPLASTY Left 2007  . TOTAL HIP ARTHROPLASTY Right 2010  . TOTAL KNEE ARTHROPLASTY Right 06/25/2014   Procedure: RIGHT TOTAL KNEE ARTHROPLASTY;  Surgeon: Gearlean Alf, MD;  Location: [facility];  Service: Orthopedics;  Laterality: Right;    Current Medications: Outpatient Medications Prior to Visit  Medication Sig Dispense Refill  . aspirin 81 MG tablet Take 1 tablet (81 mg total) by mouth daily.    Marland Kitchen atorvastatin (LIPITOR) 40 MG  tablet Take 0.5 tablets (20 mg total) by mouth at bedtime. 45 tablet 1  . benazepril (LOTENSIN) 20 MG tablet Take 1.5 tablets (30 mg total) by mouth daily. 135 tablet 1  . diltiazem (DILT-XR) 180 MG 24 hr capsule Take 1 capsule (180 mg total) by mouth daily. 90 capsule 1  . furosemide (LASIX) 40 MG tablet Take 1 tablet (40 mg total) by mouth daily. 90 tablet 1  . levothyroxine (SYNTHROID, LEVOTHROID) 150 MCG tablet Take 1 tablet (150 mcg total) by mouth daily before breakfast. 30 tablet 5  . LUMIGAN 0.01 % SOLN Place 1 drop into both eyes 2 (two) times daily.     . metoprolol tartrate (LOPRESSOR) 25 MG tablet TAKE 1/2 TABLET(12.5 MG) BY MOUTH TWICE DAILY 90 tablet 1  . nitroGLYCERIN (NITROSTAT)  0.4 MG SL tablet PLACE 1 TABLET UNDER THE TONGUE EVERY 5 MINUTES AS NEEDED FOR CHEST PAIN 25 tablet 0  . Timolol Maleate 0.5 % (DAILY) SOLN Apply 1 drop to eye daily.     No facility-administered medications prior to visit.      Allergies:   Amlodipine besylate   Social History   Socioeconomic History  . Marital status: Married    Spouse name: Not on file  . Number of children: 2  . Years of education: Not on file  . Highest education level: Not on file  Occupational History  . Occupation: retired, Nurse, learning disability  Social Needs  . Financial resource strain: Not on file  . Food insecurity:    Worry: Not on file    Inability: Not on file  . Transportation needs:    Medical: Not on file    Non-medical: Not on file  Tobacco Use  . Smoking status: Former Smoker    Types: Cigarettes    Last attempt to quit: 09/21/1984    Years since quitting: 34.0  . Smokeless tobacco: Never Used  . Tobacco comment: smoked 1958-1986, up to 1 ppd  Substance and Sexual Activity  . Alcohol use: Yes    Comment: socially on occasion  . Drug use: No  . Sexual activity: Not on file  Lifestyle  . Physical activity:    Days per week: Not on file    Minutes per session: Not on file  . Stress: Not on file  Relationships  . Social connections:    Talks on phone: Not on file    Gets together: Not on file    Attends religious service: Not on file    Active member of club or organization: Not on file    Attends meetings of clubs or organizations: Not on file    Relationship status: Not on file  Other Topics Concern  . Not on file  Social History Narrative   2 daughters, one is a Therapist, sports @ [employer]     Family History:  The patient's family history includes Colon cancer (age of onset: 62) in his father; Coronary artery disease in his mother; Diabetes in his maternal grandfather and sister; Heart attack (age of onset: 57) in his sister; Heart attack (age of onset: 34) in his maternal  grandfather; Hypertension in his father; Pancreatitis in his father.     ROS:   Please see the history of present illness.    ROS All other systems reviewed and are negative.   PHYSICAL EXAM:   VS:  BP 134/82   Pulse (!) 59   Ht 5\' 9"  (1.753 m)   Wt 193 lb 1.9  oz (87.6 kg)   SpO2 98%   BMI 28.52 kg/m    Affect appropriate Healthy:  appears stated age 11: normal Neck supple with no adenopathy JVP normal no bruits no thyromegaly Lungs clear with no wheezing and good diaphragmatic motion Heart:  S1/S2 no murmur, no rub, gallop or click PMI normal Abdomen: benighn, BS positve, no tenderness, no AAA no bruit.  No HSM or HJR Distal pulses intact with no bruits No edema Neuro non-focal Skin warm and dry Post right TKR  Post lumbar laminectomy    Wt Readings from Last 3 Encounters:  10/06/18 193 lb 1.9 oz (87.6 kg)  09/07/18 193 lb 6 oz (87.7 kg)  05/11/18 194 lb (88 kg)      Studies/Labs Reviewed:   EKG:  03/03/17   NSR rate 60 PAC LAFB stable  10/06/18 afib rate 59 LAFB   Recent Labs: 05/11/2018: ALT 14; BUN 19; Creatinine, Ser 1.14; Hemoglobin 15.5; Platelets 221.0; Potassium 4.5; Sodium 142; TSH 1.95   Lipid Panel    Component Value Date/Time   CHOL 144 11/03/2017 1126   CHOL 161 10/31/2014 0824   TRIG 93.0 11/03/2017 1126   TRIG 81 10/31/2014 0824   TRIG 86 07/23/2006 1012   HDL 46.30 11/03/2017 1126   HDL 49 10/31/2014 0824   CHOLHDL 3 11/03/2017 1126   VLDL 18.6 11/03/2017 1126   LDLCALC 79 11/03/2017 1126   LDLCALC 96 10/31/2014 0824    Additional studies/ records that were reviewed today include:  03/2016 Myoview  Study Highlights   Nuclear stress EF: 62%. Normal LV function  There was no ST segment deviation noted during stress.  The study is normal. There is no evidence of ischemia or previous infarction.  This is a low risk study.     Carotid dopplers 12/2015 Stable carotid artery disease over serial exams. 1-39% bilateral ICA  stenosis. Patent vertebral arteries with antegrade flow. Normal subclavian arteries, bilaterally. Abnormal right thyroid appearance.   ASSESSMENT & PLAN:   LE edema: better  Continue daily lasix related to TKR and varicosities   CAD: DES to circumflex 2006  stable with low risk Myoview 03/31/16 . Continue ASA, statin and BB  Carotid artery disease: stable doppler study in 01/10/18  Plaque no significant stenosis . Repeat due in 2 years ( 12/2019 )  HLD: continue statin. Followed by Dr. Larose Kells  Hypothyroidism: continue synthroid followed by Dr. Irma Newness:  Post right TKR and lumbar surgery    Afib:  New diagnosis BMET/CBC PLT ordered start eliquis 5 bid for CHADVASC 4.  Continue cardizem for rate control TTE ordered depending on EF and atrial size may consider [facility] but given age and fairly asymptomatic status likely rate control and anticoagulation   Time spent with patient discussing new diagnosis afib , exam and ordering labs TTE  45 minutes    Jenkins Rouge

## 2018-10-05 DIAGNOSIS — G8311 Monoplegia of lower limb affecting right dominant side: Secondary | ICD-10-CM | POA: Diagnosis not present

## 2018-10-06 ENCOUNTER — Encounter: Payer: Self-pay | Admitting: Cardiovascular Disease

## 2018-10-06 ENCOUNTER — Ambulatory Visit: Payer: Medicare Other | Admitting: Cardiovascular Disease

## 2018-10-06 VITALS — BP 134/82 | HR 59 | Ht 69.0 in | Wt 193.1 lb

## 2018-10-06 DIAGNOSIS — E785 Hyperlipidemia, unspecified: Secondary | ICD-10-CM | POA: Diagnosis not present

## 2018-10-06 DIAGNOSIS — I2510 Atherosclerotic heart disease of native coronary artery without angina pectoris: Secondary | ICD-10-CM | POA: Diagnosis not present

## 2018-10-06 DIAGNOSIS — I739 Peripheral vascular disease, unspecified: Secondary | ICD-10-CM | POA: Diagnosis not present

## 2018-10-06 DIAGNOSIS — I4891 Unspecified atrial fibrillation: Secondary | ICD-10-CM | POA: Diagnosis not present

## 2018-10-06 DIAGNOSIS — I779 Disorder of arteries and arterioles, unspecified: Secondary | ICD-10-CM

## 2018-10-06 LAB — BASIC METABOLIC PANEL
BUN/Creatinine Ratio: 13 (ref 10–24)
BUN: 17 mg/dL (ref 8–27)
CO2: 22 mmol/L (ref 20–29)
Calcium: 9.9 mg/dL (ref 8.6–10.2)
Chloride: 104 mmol/L (ref 96–106)
Creatinine, Ser: 1.27 mg/dL (ref 0.76–1.27)
GFR calc Af Amer: 61 mL/min/{1.73_m2} (ref >59)
GFR calc non Af Amer: 53 mL/min/{1.73_m2} — ABNORMAL LOW (ref >59)
Glucose: 86 mg/dL (ref 65–99)
Potassium: 4.2 mmol/L (ref 3.5–5.2)
Sodium: 143 mmol/L (ref 134–144)

## 2018-10-06 LAB — CBC WITH DIFFERENTIAL/PLATELET
Basophils Absolute: 0.1 10*3/uL (ref 0.0–0.2)
Basos: 2 %
EOS (ABSOLUTE): 0.6 10*3/uL — ABNORMAL HIGH (ref 0.0–0.4)
Eos: 9 %
Hematocrit: 45.9 % (ref 37.5–51.0)
Hemoglobin: 15.9 g/dL (ref 13.0–17.7)
Immature Grans (Abs): 0 10*3/uL (ref 0.0–0.1)
Immature Granulocytes: 0 %
Lymphocytes Absolute: 1.4 10*3/uL (ref 0.7–3.1)
Lymphs: 20 %
MCH: 31.1 pg (ref 26.6–33.0)
MCHC: 34.6 g/dL (ref 31.5–35.7)
MCV: 90 fL (ref 79–97)
Monocytes Absolute: 0.8 10*3/uL (ref 0.1–0.9)
Monocytes: 11 %
Neutrophils Absolute: 3.9 10*3/uL (ref 1.4–7.0)
Neutrophils: 58 %
Platelets: 240 10*3/uL (ref 150–450)
RBC: 5.12 x10E6/uL (ref 4.14–5.80)
RDW: 13.9 % (ref 11.6–15.4)
WBC: 6.7 10*3/uL (ref 3.4–10.8)

## 2018-10-06 MED ORDER — APIXABAN 5 MG PO TABS: 5.0000 mg | ORAL_TABLET | Freq: Two times a day (BID) | ORAL | 11 refills | Status: DC

## 2018-10-06 NOTE — Patient Instructions (Signed)
Medication Instructions:  Your physician has recommended you make the following change in your medication:  1-START Eliquis 5 mg by mouth twice daily.  If you need a refill on your cardiac medications before your next appointment, please call your pharmacy.   Lab work: Your physician recommends that you have lab work today. BMET and CBC.  If you have labs (blood work) drawn today and your tests are completely normal, you will receive your results only by: Marland Kitchen MyChart Message (if you have MyChart) OR . A paper copy in the mail If you have any lab test that is abnormal or we need to change your treatment, we will call you to review the results.  Testing/Procedures: Your physician has requested that you have an echocardiogram. Echocardiography is a painless test that uses sound waves to create images of your heart. It provides your doctor with information about the size and shape of your heart and how well your heart's chambers and valves are working. This procedure takes approximately one hour. There are no restrictions for this procedure.  Follow-Up: At [facility], you and your health needs are our priority.  As part of our continuing mission to provide you with exceptional heart care, we have created designated Provider Care Teams.  These Care Teams include your primary Cardiologist (physician) and Advanced Practice Providers (APPs -  Physician Assistants and Nurse Practitioners) who all work together to provide you with the care you need, when you need it. You will need a follow up appointment next available.  You may see Dr. Johnsie Cancel or one of the following Advanced Practice Providers on your designated Care Team:   Truitt Merle, NP Cecilie Kicks, NP . Kathyrn Drown, NP

## 2018-10-07 ENCOUNTER — Telehealth: Payer: Self-pay | Admitting: Cardiovascular Disease

## 2018-10-07 DIAGNOSIS — G8311 Monoplegia of lower limb affecting right dominant side: Secondary | ICD-10-CM | POA: Diagnosis not present

## 2018-10-07 NOTE — Telephone Encounter (Signed)
New Message    Pt says he is doing a trial for Eliquis and they need a forms signed  Please call

## 2018-10-07 NOTE — Telephone Encounter (Signed)
Follow Up:; ° ° °Returning your call. °

## 2018-10-07 NOTE — Telephone Encounter (Signed)
Follow up   Pt is returning phone call  Please call

## 2018-10-07 NOTE — Telephone Encounter (Signed)
**Note De-identified  Obfuscation** LMTCB

## 2018-10-07 NOTE — Telephone Encounter (Signed)
Left a message on the pts VM asking him to call me back.

## 2018-10-07 NOTE — Telephone Encounter (Signed)
  Patient is calling back. He isn't sure who he is supposed to talk to.

## 2018-10-10 DIAGNOSIS — G8311 Monoplegia of lower limb affecting right dominant side: Secondary | ICD-10-CM | POA: Diagnosis not present

## 2018-10-10 NOTE — Telephone Encounter (Signed)
The pt states that when he called to activate his free 30 day card for Eliquis the lady he s/w stated that there is a form that he needs to sign and that his MD will need to fill out as well. The pt states that he did receive his free 30 days of Eliquis and that to his knowledge his insurance will cover.  I called the pts pharmacy [facility] and was advised that the pts insurance will cover the pts Eliquis and that the cost to the pt will be $45 a month and that a PA is not required.  I have advised the pt that I will discuss the form with our Eliquis drug rep and if it is required I will contact them. He verbalized understanding and thanked me for calling him back.

## 2018-10-12 DIAGNOSIS — G8311 Monoplegia of lower limb affecting right dominant side: Secondary | ICD-10-CM | POA: Diagnosis not present

## 2018-10-13 ENCOUNTER — Other Ambulatory Visit: Payer: Self-pay

## 2018-10-13 ENCOUNTER — Ambulatory Visit (HOSPITAL_COMMUNITY): Payer: Medicare Other | Attending: Cardiovascular Disease

## 2018-10-13 DIAGNOSIS — I4891 Unspecified atrial fibrillation: Secondary | ICD-10-CM

## 2018-10-13 DIAGNOSIS — I2510 Atherosclerotic heart disease of native coronary artery without angina pectoris: Secondary | ICD-10-CM

## 2018-10-13 DIAGNOSIS — I739 Peripheral vascular disease, unspecified: Secondary | ICD-10-CM | POA: Insufficient documentation

## 2018-10-13 DIAGNOSIS — E785 Hyperlipidemia, unspecified: Secondary | ICD-10-CM

## 2018-10-13 DIAGNOSIS — I779 Disorder of arteries and arterioles, unspecified: Secondary | ICD-10-CM

## 2018-10-14 ENCOUNTER — Other Ambulatory Visit: Payer: Self-pay | Admitting: Internal Medicine

## 2018-10-14 DIAGNOSIS — Z85820 Personal history of malignant melanoma of skin: Secondary | ICD-10-CM | POA: Diagnosis not present

## 2018-10-14 DIAGNOSIS — Z1283 Encounter for screening for malignant neoplasm of skin: Secondary | ICD-10-CM | POA: Diagnosis not present

## 2018-10-14 DIAGNOSIS — D225 Melanocytic nevi of trunk: Secondary | ICD-10-CM | POA: Diagnosis not present

## 2018-10-14 DIAGNOSIS — R601 Generalized edema: Secondary | ICD-10-CM

## 2018-10-14 DIAGNOSIS — Z08 Encounter for follow-up examination after completed treatment for malignant neoplasm: Secondary | ICD-10-CM | POA: Diagnosis not present

## 2018-10-14 DIAGNOSIS — C44519 Basal cell carcinoma of skin of other part of trunk: Secondary | ICD-10-CM | POA: Diagnosis not present

## 2018-10-14 DIAGNOSIS — Z85828 Personal history of other malignant neoplasm of skin: Secondary | ICD-10-CM | POA: Diagnosis not present

## 2018-10-17 DIAGNOSIS — G8311 Monoplegia of lower limb affecting right dominant side: Secondary | ICD-10-CM | POA: Diagnosis not present

## 2018-10-17 NOTE — Progress Notes (Signed)
Cardiology Office Note    Date:  10/20/2018   ID:  [name], DOB 12-30-1935, MRN [id number]  PCP:  Colon Branch, MD  Cardiologist:  Dr. Johnsie Cancel  CC: 3 week follow up   History of Present Illness:  [name] is a 83 y.o. male with a history of CAD s/p DES to LCx (2006), carotid artery disease, HTN, HLD who presents to clinic for follow up. No angina compliant with meds Normal nuclear study 2017 prior to back surgery. LE Edema from varicosities and previous TKR takes lasix for this.   In office today found to be in new onset afib. He did not notice. No palpitations, mild exertional dyspnea chronic No chest pain.   Long discussion with Minerva Fester about afib and need for anticoagulation  This patients CHA2DS2-VASc Score and unadjusted Ischemic Stroke Rate (% per year) is equal to 4.8 % stroke rate/year from a score of 4  Above score calculated as 1 point each if present [CHF, HTN, DM, Vascular=MI/PAD/Aortic Plaque, Age if 65-74, or Male] Above score calculated as 2 points each if present [Age > 75, or Stroke/TIA/TE]  He is asymptomatic Tolerating blood thinner No palpitations TTE done 10/13/18 reviewed EF normal 55-60% severe LAE Given this would rate control and anticoagulate and not pursue [facility]   Past Medical History:  Diagnosis Date  . Arthritis   . BPH (benign prostatic hypertrophy)   . Carotid artery disease ([place])    Doppler, December 08, 2011, 00 87% R. ICA, 86-76% LICA, followup 1 year  . Coronary artery disease    DES Circumflex or CVA 2006  /  clear, February, 2011, EF 70%, no ischemia or  . Ejection fraction    EF 60%, echo, 2009, mildly calcified aortic leaflets  . History of colonic polyps   . Hyperlipidemia   . Hypertension   . Hypothyroidism   . Lumbar radiculopathy   . Multiple thyroid nodules    Avascular echogenic areas noted in the right thyroid at the time of carotid Doppler.   Marland Kitchen PONV (postoperative nausea and vomiting)    "nausea with first hip  surgery"  . Skin cancer (melanoma) (H[facility])    PMH of   . Spinal stenosis, lumbar   . Tremor    Fine tremor right upper extremity  . Venous insufficiency     Past Surgical History:  Procedure Laterality Date  . COLONOSCOPY    . CORONARY ANGIOPLASTY WITH STENT PLACEMENT  2006   x 2 stents; DES to CX and dRCA '06  . KNEE SURGERY  1970's   "chipped bone"  . LUMBAR LAMINECTOMY/DECOMPRESSION MICRODISCECTOMY Left 07/13/2016   Procedure: LUMBAR LAMINECTOMY AND FORAMINOTOMY Lumbar two, three, Lumbar three-four, Lumbar four-five, LEFT Lumbar five-Sacral one DISECTOMY;  Surgeon: Newman Pies, MD;  Location: [facility];  Service: Neurosurgery;  Laterality: Left;  . TOE SURGERY  1997  . TONSILLECTOMY    . TOTAL HIP ARTHROPLASTY Left 2007  . TOTAL HIP ARTHROPLASTY Right 2010  . TOTAL KNEE ARTHROPLASTY Right 06/25/2014   Procedure: RIGHT TOTAL KNEE ARTHROPLASTY;  Surgeon: Gearlean Alf, MD;  Location: [facility];  Service: Orthopedics;  Laterality: Right;    Current Medications: Outpatient Medications Prior to Visit  Medication Sig Dispense Refill  . apixaban (ELIQUIS) 5 MG TABS tablet Take 1 tablet (5 mg total) by mouth 2 (two) times daily. 60 tablet 11  . aspirin 81 MG tablet Take 1 tablet (81 mg total) by mouth daily.    Marland Kitchen  atorvastatin (LIPITOR) 40 MG tablet Take 1 tablet (40 mg total) by mouth at bedtime. 45 tablet 3  . benazepril (LOTENSIN) 20 MG tablet Take 1.5 tablets (30 mg total) by mouth daily. 135 tablet 1  . diltiazem (DILT-XR) 180 MG 24 hr capsule Take 1 capsule (180 mg total) by mouth daily. 90 capsule 3  . furosemide (LASIX) 40 MG tablet Take 1 tablet (40 mg total) by mouth daily. 90 tablet 3  . levothyroxine (SYNTHROID, LEVOTHROID) 150 MCG tablet Take 1 tablet (150 mcg total) by mouth daily before breakfast. 30 tablet 5  . LUMIGAN 0.01 % SOLN Place 1 drop into both eyes 2 (two) times daily.     . metoprolol tartrate (LOPRESSOR) 25 MG tablet TAKE 1/2 TABLET(12.5 MG) BY MOUTH TWICE DAILY  90 tablet 1  . nitroGLYCERIN (NITROSTAT) 0.4 MG SL tablet PLACE 1 TABLET UNDER THE TONGUE EVERY 5 MINUTES AS NEEDED FOR CHEST PAIN 25 tablet 0  . Timolol Maleate 0.5 % (DAILY) SOLN Apply 1 drop to eye daily.     No facility-administered medications prior to visit.      Allergies:   Amlodipine besylate   Social History   Socioeconomic History  . Marital status: Married    Spouse name: Not on file  . Number of children: 2  . Years of education: Not on file  . Highest education level: Not on file  Occupational History  . Occupation: retired, Nurse, learning disability  Social Needs  . Financial resource strain: Not on file  . Food insecurity:    Worry: Not on file    Inability: Not on file  . Transportation needs:    Medical: Not on file    Non-medical: Not on file  Tobacco Use  . Smoking status: Former Smoker    Types: Cigarettes    Last attempt to quit: 09/21/1984    Years since quitting: 34.1  . Smokeless tobacco: Never Used  . Tobacco comment: smoked 1958-1986, up to 1 ppd  Substance and Sexual Activity  . Alcohol use: Yes    Comment: socially on occasion  . Drug use: No  . Sexual activity: Not on file  Lifestyle  . Physical activity:    Days per week: Not on file    Minutes per session: Not on file  . Stress: Not on file  Relationships  . Social connections:    Talks on phone: Not on file    Gets together: Not on file    Attends religious service: Not on file    Active member of club or organization: Not on file    Attends meetings of clubs or organizations: Not on file    Relationship status: Not on file  Other Topics Concern  . Not on file  Social History Narrative   2 daughters, one is a Therapist, sports @ [employer]     Family History:  The patient's family history includes Colon cancer (age of onset: 62) in his father; Coronary artery disease in his mother; Diabetes in his maternal grandfather and sister; Heart attack (age of onset: 11) in his sister; Heart  attack (age of onset: 34) in his maternal grandfather; Hypertension in his father; Pancreatitis in his father.     ROS:   Please see the history of present illness.    ROS All other systems reviewed and are negative.   PHYSICAL EXAM:   VS:  BP 134/78   Pulse 61   Ht 5\' 9"  (1.753 m)   Wt  196 lb (88.9 kg)   BMI 28.94 kg/m    Affect appropriate Healthy:  appears stated age 64: normal Neck supple with no adenopathy JVP normal no bruits no thyromegaly Lungs clear with no wheezing and good diaphragmatic motion Heart:  S1/S2 no murmur, no rub, gallop or click PMI normal Abdomen: benighn, BS positve, no tenderness, no AAA no bruit.  No HSM or HJR Distal pulses intact with no bruits No edema Neuro non-focal Skin warm and dry Post right TKR  Post lumbar laminectomy    Wt Readings from Last 3 Encounters:  10/20/18 196 lb (88.9 kg)  10/06/18 193 lb 1.9 oz (87.6 kg)  09/07/18 193 lb 6 oz (87.7 kg)      Studies/Labs Reviewed:   EKG:  03/03/17   NSR rate 60 PAC LAFB stable  10/06/18 afib rate 59 LAFB   Recent Labs: 05/11/2018: ALT 14; TSH 1.95 10/06/2018: BUN 17; Creatinine, Ser 1.27; Hemoglobin 15.9; Platelets 240; Potassium 4.2; Sodium 143   Lipid Panel    Component Value Date/Time   CHOL 144 11/03/2017 1126   CHOL 161 10/31/2014 0824   TRIG 93.0 11/03/2017 1126   TRIG 81 10/31/2014 0824   TRIG 86 07/23/2006 1012   HDL 46.30 11/03/2017 1126   HDL 49 10/31/2014 0824   CHOLHDL 3 11/03/2017 1126   VLDL 18.6 11/03/2017 1126   LDLCALC 79 11/03/2017 1126   LDLCALC 96 10/31/2014 0824    Additional studies/ records that were reviewed today include:  03/2016 Myoview  Study Highlights   Nuclear stress EF: 62%. Normal LV function  There was no ST segment deviation noted during stress.  The study is normal. There is no evidence of ischemia or previous infarction.  This is a low risk study.     Carotid dopplers 12/2015 Stable carotid artery disease over serial  exams. 1-39% bilateral ICA stenosis. Patent vertebral arteries with antegrade flow. Normal subclavian arteries, bilaterally. Abnormal right thyroid appearance.   ASSESSMENT & PLAN:   LE edema: better  Continue daily lasix related to TKR and varicosities   CAD: DES to circumflex 2006  stable with low risk Myoview 03/31/16 . Continue ASA, statin and BB  Carotid artery disease: stable doppler study in 01/10/18  Plaque no significant stenosis . Repeat due in 2 years ( 12/2019 )  HLD: continue statin. Followed by Dr. Larose Kells  Hypothyroidism: continue synthroid followed by Dr. Irma Newness:  Post right TKR and lumbar surgery    Afib:  New diagnosis  09/28/18 On Eliquis 5 bid for CHADVASC 4.  Continue cardizem for rate control Given asymptomatic state normal EF by TTE 10/13/18 And severe LAE will adopt strategy of rate control and anticoagulation and not aggressive [facility]    Jenkins Rouge

## 2018-10-19 DIAGNOSIS — G8311 Monoplegia of lower limb affecting right dominant side: Secondary | ICD-10-CM | POA: Diagnosis not present

## 2018-10-20 ENCOUNTER — Ambulatory Visit: Payer: Medicare Other | Admitting: Cardiovascular Disease

## 2018-10-20 VITALS — BP 134/78 | HR 61 | Ht 69.0 in | Wt 196.0 lb

## 2018-10-20 DIAGNOSIS — I4891 Unspecified atrial fibrillation: Secondary | ICD-10-CM

## 2018-10-20 DIAGNOSIS — I6523 Occlusion and stenosis of bilateral carotid arteries: Secondary | ICD-10-CM | POA: Diagnosis not present

## 2018-10-20 DIAGNOSIS — I739 Peripheral vascular disease, unspecified: Secondary | ICD-10-CM | POA: Diagnosis not present

## 2018-10-20 DIAGNOSIS — E785 Hyperlipidemia, unspecified: Secondary | ICD-10-CM | POA: Diagnosis not present

## 2018-10-20 DIAGNOSIS — I779 Disorder of arteries and arterioles, unspecified: Secondary | ICD-10-CM

## 2018-10-20 NOTE — Patient Instructions (Signed)

## 2018-10-24 ENCOUNTER — Telehealth: Payer: Self-pay

## 2018-10-24 DIAGNOSIS — G8311 Monoplegia of lower limb affecting right dominant side: Secondary | ICD-10-CM | POA: Diagnosis not present

## 2018-10-24 NOTE — Telephone Encounter (Signed)
Copied from [place] [id number]. Topic: General - Other >> Oct 24, 2018  2:41 PM Percell Belt A wrote: Reason for CRM:   Pt called in stated that he has script from DR paz for shingrix shot back in dec. It is dated Dec 18th.  He wanted to know if this would still be good or would he need a new one.  He did not want to call the pharmacy.  He requested that I just sent a note to Dr Larose Kells?    Best number -[phone]

## 2018-10-24 NOTE — Telephone Encounter (Signed)
Spoke w/ Pt- informed Rx should be good for 1 year.

## 2018-10-26 ENCOUNTER — Other Ambulatory Visit: Payer: Self-pay | Admitting: Cardiovascular Disease

## 2018-10-26 DIAGNOSIS — G8311 Monoplegia of lower limb affecting right dominant side: Secondary | ICD-10-CM | POA: Diagnosis not present

## 2018-10-26 NOTE — Telephone Encounter (Signed)
  Pt c/o medication issue:  1. Name of Medication: atorvastatin (LIPITOR) 40 MG tablet  2. How are you currently taking this medication (dosage and times per day)? Take 1 tablet (40 mg total) by mouth at bedtime  3. Are you having a reaction (difficulty breathing--STAT)?no  4. What is your medication issue? Pt told pharmacist that he takes 1/2 pill a day so the pharmacy is calling to verify the correct dosage

## 2018-10-31 ENCOUNTER — Other Ambulatory Visit: Payer: Self-pay

## 2018-10-31 DIAGNOSIS — G8311 Monoplegia of lower limb affecting right dominant side: Secondary | ICD-10-CM | POA: Diagnosis not present

## 2018-10-31 MED ORDER — ATORVASTATIN CALCIUM 40 MG PO TABS: 20.0000 mg | ORAL_TABLET | Freq: Every day | ORAL | 3 refills | Status: DC

## 2018-11-03 DIAGNOSIS — G8311 Monoplegia of lower limb affecting right dominant side: Secondary | ICD-10-CM | POA: Diagnosis not present

## 2018-11-09 DIAGNOSIS — G8311 Monoplegia of lower limb affecting right dominant side: Secondary | ICD-10-CM | POA: Diagnosis not present

## 2018-11-11 DIAGNOSIS — G8311 Monoplegia of lower limb affecting right dominant side: Secondary | ICD-10-CM | POA: Diagnosis not present

## 2018-11-14 DIAGNOSIS — G8311 Monoplegia of lower limb affecting right dominant side: Secondary | ICD-10-CM | POA: Diagnosis not present

## 2018-11-15 DIAGNOSIS — I10 Essential (primary) hypertension: Secondary | ICD-10-CM | POA: Diagnosis not present

## 2018-11-15 DIAGNOSIS — M545 Low back pain: Secondary | ICD-10-CM | POA: Diagnosis not present

## 2018-11-17 DIAGNOSIS — G8311 Monoplegia of lower limb affecting right dominant side: Secondary | ICD-10-CM | POA: Diagnosis not present

## 2018-11-21 DIAGNOSIS — G8311 Monoplegia of lower limb affecting right dominant side: Secondary | ICD-10-CM | POA: Diagnosis not present

## 2018-11-23 DIAGNOSIS — G8311 Monoplegia of lower limb affecting right dominant side: Secondary | ICD-10-CM | POA: Diagnosis not present

## 2018-11-25 DIAGNOSIS — D225 Melanocytic nevi of trunk: Secondary | ICD-10-CM | POA: Diagnosis not present

## 2018-11-25 DIAGNOSIS — D485 Neoplasm of uncertain behavior of skin: Secondary | ICD-10-CM | POA: Diagnosis not present

## 2018-11-25 DIAGNOSIS — Z85828 Personal history of other malignant neoplasm of skin: Secondary | ICD-10-CM | POA: Diagnosis not present

## 2018-11-25 DIAGNOSIS — Z08 Encounter for follow-up examination after completed treatment for malignant neoplasm: Secondary | ICD-10-CM | POA: Diagnosis not present

## 2019-01-10 ENCOUNTER — Encounter: Payer: Medicare Other | Admitting: Internal Medicine

## 2019-01-16 ENCOUNTER — Other Ambulatory Visit: Payer: Self-pay | Admitting: Internal Medicine

## 2019-02-06 ENCOUNTER — Ambulatory Visit: Payer: Self-pay | Admitting: *Deleted

## 2019-02-06 NOTE — Telephone Encounter (Signed)
FYI. Virtual visit tomorrow.

## 2019-02-06 NOTE — Telephone Encounter (Signed)
Patient is calling to report he is having swelling in leg that has not gone down- R leg has not returned to normal. Patient states he has not elevated as well as he could- but the other leg seems to be doing much better than this leg.  Reason for Disposition . [1] MODERATE leg swelling (e.g., swelling extends up to knees) AND [2] new onset or worsening  Answer Assessment - Initial Assessment Questions 1. ONSET: "When did the swelling start?" (e.g., minutes, hours, days)     Increase 2 weeks ago 2. LOCATION: "What part of the leg is swollen?"  "Are both legs swollen or just one leg?"     Never had it this bad- swelling is worse on R calf- swelling is about the same in the feet 3. SEVERITY: "How bad is the swelling?" (e.g., localized; mild, moderate, severe)  - Localized - small area of swelling localized to one leg  - MILD pedal edema - swelling limited to foot and ankle, pitting edema < 1/4 inch (6 mm) deep, rest and elevation eliminate most or all swelling  - MODERATE edema - swelling of lower leg to knee, pitting edema > 1/4 inch (6 mm) deep, rest and elevation only partially reduce swelling  - SEVERE edema - swelling extends above knee, facial or hand swelling present      Moderate-R, top of L foot is swollen same as R 4. REDNESS: "Does the swelling look red or infected?"     No redness in swelling 5. PAIN: "Is the swelling painful to touch?" If so, ask: "How painful is it?"   (Scale 1-10; mild, moderate or severe)     Pain in toes- feels pressure in both feet 6. FEVER: "Do you have a fever?" If so, ask: "What is it, how was it measured, and when did it start?"      No fever 7. CAUSE: "What do you think is causing the leg swelling?"     No sure- patient does report he started Eliquis in January 8. MEDICAL HISTORY: "Do you have a history of heart failure, kidney disease, liver failure, or cancer?"     Cardiac disease 9. RECURRENT SYMPTOM: "Have you had leg swelling before?" If so, ask:  "When was the last time?" "What happened that time?"     Feet have swollen before- but not like this 10. OTHER SYMPTOMS: "Do you have any other symptoms?" (e.g., chest pain, difficulty breathing)       no 11. PREGNANCY: "Is there any chance you are pregnant?" "When was your last menstrual period?"       n/a  Protocols used: LEG SWELLING AND EDEMA-A-AH

## 2019-02-06 NOTE — Telephone Encounter (Signed)
noted 

## 2019-02-07 ENCOUNTER — Ambulatory Visit (INDEPENDENT_AMBULATORY_CARE_PROVIDER_SITE_OTHER): Payer: Medicare Other | Admitting: Internal Medicine

## 2019-02-07 ENCOUNTER — Other Ambulatory Visit: Payer: Self-pay

## 2019-02-07 ENCOUNTER — Ambulatory Visit (HOSPITAL_BASED_OUTPATIENT_CLINIC_OR_DEPARTMENT_OTHER)
Admission: RE | Admit: 2019-02-07 | Discharge: 2019-02-07 | Disposition: A | Discharge status: Home / Self Care | Payer: Medicare Other | Source: Ambulatory Visit | Attending: Internal Medicine | Admitting: Internal Medicine

## 2019-02-07 ENCOUNTER — Encounter: Payer: Self-pay | Admitting: Internal Medicine

## 2019-02-07 ENCOUNTER — Other Ambulatory Visit (HOSPITAL_BASED_OUTPATIENT_CLINIC_OR_DEPARTMENT_OTHER): Payer: Medicare Other

## 2019-02-07 VITALS — BP 134/75 | HR 71 | Temp 97.5°F | Resp 16 | Ht 69.0 in | Wt 196.2 lb

## 2019-02-07 DIAGNOSIS — R739 Hyperglycemia, unspecified: Secondary | ICD-10-CM

## 2019-02-07 DIAGNOSIS — I482 Chronic atrial fibrillation, unspecified: Secondary | ICD-10-CM

## 2019-02-07 DIAGNOSIS — M79661 Pain in right lower leg: Secondary | ICD-10-CM | POA: Diagnosis not present

## 2019-02-07 DIAGNOSIS — E039 Hypothyroidism, unspecified: Secondary | ICD-10-CM

## 2019-02-07 DIAGNOSIS — I10 Essential (primary) hypertension: Secondary | ICD-10-CM | POA: Diagnosis not present

## 2019-02-07 DIAGNOSIS — M7989 Other specified soft tissue disorders: Secondary | ICD-10-CM | POA: Diagnosis not present

## 2019-02-07 NOTE — Progress Notes (Signed)
Subjective:    Patient ID: [name], male    DOB: 1936-02-24, 83 y.o.   MRN: [id number]  DOS:  02/07/2019 Type of visit - description: Acute visit 2 weeks ago, the patient noted right calf swelling, by his own measures, is 2 inches larger. Denies any redness, warmness or tenderness. Both feet are slightly swollen but the right calf is definitely different.  Good compliance with medication including Eliquis  I see per chart review that he has right leg weakness, seen by Ortho, on physical therapy  Since he is here, we review all his chronic medical problems    Wt Readings from Last 3 Encounters:  02/07/19 196 lb 4 oz (89 kg)  10/20/18 196 lb (88.9 kg)  10/06/18 193 lb 1.9 oz (87.6 kg)     BP Readings from Last 3 Encounters:  02/07/19 134/75  10/20/18 134/78  10/06/18 134/82    Review of Systems Denies fever chills No chest pain no difficulty breathing No cough Past Medical History:  Diagnosis Date  . Arthritis   . BPH (benign prostatic hypertrophy)   . Carotid artery disease ([place])    Doppler, December 08, 2011, 00 02% R. ICA, 58-52% LICA, followup 1 year  . Coronary artery disease    DES Circumflex or CVA 2006  /  clear, February, 2011, EF 70%, no ischemia or  . Ejection fraction    EF 60%, echo, 2009, mildly calcified aortic leaflets  . History of colonic polyps   . Hyperlipidemia   . Hypertension   . Hypothyroidism   . Lumbar radiculopathy   . Multiple thyroid nodules    Avascular echogenic areas noted in the right thyroid at the time of carotid Doppler.   Marland Kitchen PONV (postoperative nausea and vomiting)    "nausea with first hip surgery"  . Skin cancer (melanoma) ([facility])    PMH of   . Spinal stenosis, lumbar   . Tremor    Fine tremor right upper extremity  . Venous insufficiency     Past Surgical History:  Procedure Laterality Date  . COLONOSCOPY    . CORONARY ANGIOPLASTY WITH STENT PLACEMENT  2006   x 2 stents; DES to CX and dRCA '06  . KNEE SURGERY   1970's   "chipped bone"  . LUMBAR LAMINECTOMY/DECOMPRESSION MICRODISCECTOMY Left 07/13/2016   Procedure: LUMBAR LAMINECTOMY AND FORAMINOTOMY Lumbar two, three, Lumbar three-four, Lumbar four-five, LEFT Lumbar five-Sacral one DISECTOMY;  Surgeon: Newman Pies, MD;  Location: [facility];  Service: Neurosurgery;  Laterality: Left;  . TOE SURGERY  1997  . TONSILLECTOMY    . TOTAL HIP ARTHROPLASTY Left 2007  . TOTAL HIP ARTHROPLASTY Right 2010  . TOTAL KNEE ARTHROPLASTY Right 06/25/2014   Procedure: RIGHT TOTAL KNEE ARTHROPLASTY;  Surgeon: Gearlean Alf, MD;  Location: [facility];  Service: Orthopedics;  Laterality: Right;    Social History   Socioeconomic History  . Marital status: Married    Spouse name: Not on file  . Number of children: 2  . Years of education: Not on file  . Highest education level: Not on file  Occupational History  . Occupation: retired, Nurse, learning disability  Social Needs  . Financial resource strain: Not on file  . Food insecurity:    Worry: Not on file    Inability: Not on file  . Transportation needs:    Medical: Not on file    Non-medical: Not on file  Tobacco Use  . Smoking status: Former Smoker  Types: Cigarettes    Last attempt to quit: 09/21/1984    Years since quitting: 34.4  . Smokeless tobacco: Never Used  . Tobacco comment: smoked 1958-1986, up to 1 ppd  Substance and Sexual Activity  . Alcohol use: Yes    Comment: socially on occasion  . Drug use: No  . Sexual activity: Not on file  Lifestyle  . Physical activity:    Days per week: Not on file    Minutes per session: Not on file  . Stress: Not on file  Relationships  . Social connections:    Talks on phone: Not on file    Gets together: Not on file    Attends religious service: Not on file    Active member of club or organization: Not on file    Attends meetings of clubs or organizations: Not on file    Relationship status: Not on file  . Intimate partner violence:     Fear of current or ex partner: Not on file    Emotionally abused: Not on file    Physically abused: Not on file    Forced sexual activity: Not on file  Other Topics Concern  . Not on file  Social History Narrative   2 daughters, one is a Therapist, sports @ [employer]      Allergies as of 02/07/2019      Reactions   Amlodipine Besylate Other (See Comments)   REACTION: edema      Medication List       Accurate as of Feb 07, 2019 11:59 PM. If you have any questions, ask your nurse or doctor.        apixaban 5 MG Tabs tablet Commonly known as:  Eliquis Take 1 tablet (5 mg total) by mouth 2 (two) times daily.   aspirin 81 MG tablet Take 1 tablet (81 mg total) by mouth daily.   atorvastatin 40 MG tablet Commonly known as:  LIPITOR Take 0.5 tablets (20 mg total) by mouth at bedtime.   benazepril 20 MG tablet Commonly known as:  LOTENSIN Take 1.5 tablets (30 mg total) by mouth daily.   diltiazem 180 MG 24 hr capsule Commonly known as:  Dilt-XR Take 1 capsule (180 mg total) by mouth daily.   furosemide 40 MG tablet Commonly known as:  LASIX Take 1 tablet (40 mg total) by mouth daily.   levothyroxine 150 MCG tablet Commonly known as:  SYNTHROID Take 1 tablet (150 mcg total) by mouth daily before breakfast.   Lumigan 0.01 % Soln Generic drug:  bimatoprost Place 1 drop into both eyes 2 (two) times daily.   metoprolol tartrate 25 MG tablet Commonly known as:  LOPRESSOR TAKE 1/2 TABLET(12.5 MG) BY MOUTH TWICE DAILY   nitroGLYCERIN 0.4 MG SL tablet Commonly known as:  NITROSTAT PLACE 1 TABLET UNDER THE TONGUE EVERY 5 MINUTES AS NEEDED FOR CHEST PAIN   Timolol Maleate 0.5 % (DAILY) Soln Apply 1 drop to eye daily.           Objective:   Physical Exam BP 134/75 (BP Location: Left Arm, Patient Position: Sitting, Cuff Size: Small)   Pulse 71   Temp (!) 97.5 F (36.4 C) (Oral)   Resp 16   Ht 5\' 9"  (1.753 m)   Wt 196 lb 4 oz (89 kg)   SpO2 98%   BMI 28.98 kg/m  General:    Well developed, NAD, BMI noted.  HEENT:  Normocephalic . Face symmetric, atraumatic. Neck: Question of JVD at  45 degrees Lungs:  CTA B Normal respiratory effort, no intercostal retractions, no accessory muscle use. Heart: RRR,  no murmur.  Lower extremities: Left lower extremity with +/+++ pitting edema from the knees down.  Good pedal pulses. Right lower extremity: Much more swollen compared to the left, calf circumference is 2 inches larger.  Skin with no obvious redness-cellulitis-injuries. The calf is not TTP. Abdomen:  Not distended, soft, non-tender. No rebound or rigidity.   Skin: Not pale. Not jaundice Neurologic:  alert & oriented X3.  Speech normal, gait appropriate for age and unassisted Psych--  Cognition and judgment appear intact.  Cooperative with normal attention span and concentration.  Behavior appropriate. No anxious or depressed appearing.     Assessment    Assessment  (Transfer from  Dr. Linna Darner [phone]) Diabetes, A1c 6.2 2013 HTN Hyperlipidemia Hypothyroidism  Thyroid US 2014-- no nodules, inhomogeneous  CV: --CAD: stents 2006;  low risk nuclear scan 2011, 03-2016 --A Fib dx 09/2018 at regular crads OV, on Eliquis, EF --Carotid artery disease [place] 12/2014:   Stable, 1-39% RICA stenosis. Stable, 38-45% LICA stenosis. [place] 12/2015: Next 2 years R leg edema, [place] (-)   DVT 08-2014 [facility] DJD: Dr Maureen Ralphs, back surgery Dr Arnoldo Morale 06-2016 Venous insufficiency, chronic R>L edema Tremor Glaucoma H/o skin cancer , denies Melanoma; sees derm, had a visit ~ 09/2017, Dr Nevada Crane  PLAN: Right lower extremity edema: He has chronic bilateral lower extremity edema, today is much worse on the right, history of right TKR in 2015, shortly after a [place] was done for swelling @ R leg and it was negative for DVT. He is actually taking Eliquis without apparent problems and with no miss doses.  Nevertheless, we will do a ultrasound to rule out DVT.  If negative will recommend leg elevation  and low-salt diet.  Consider temporary increase of Lasix dose and stop CCB's. There is a question of JVD on exam however weight is stable, to be sure we will check a BNP Prediabetes: Check a A1c HTN: Currently on Lotensin, diltiazem, Lasix, metoprolol.  Upon arrival BP was elevated, recheck it was 134/75.  We will get a CMP and CBC High cholesterol: On Lipitor, check a FLP on RTC Hypothyroidism: On Synthroid, check a TSH Atrial fibrillation: Dx @ cardiology 09-2018, during a regular office visit, EF was satisfactory, was started on Eliquis.  Today he seems to be on an NSR on physical exam. RTC 3 months

## 2019-02-07 NOTE — Patient Instructions (Addendum)
GO TO THE LAB : Get the blood work     GO TO THE FRONT DESK Schedule your next appointment for a physical exam in 3 months.     STOP BY THE FIRST FLOOR:  get the ultrasound    Check the  blood pressure weekly Be sure your blood pressure is between 110/65 and  135/85. If it is consistently higher or lower, let me know  Please elevate your legs at least an hour twice a day.  Avoid excessive salt

## 2019-02-08 ENCOUNTER — Encounter: Payer: Self-pay | Admitting: Internal Medicine

## 2019-02-08 ENCOUNTER — Other Ambulatory Visit: Payer: Self-pay | Admitting: Internal Medicine

## 2019-02-08 DIAGNOSIS — I4891 Unspecified atrial fibrillation: Secondary | ICD-10-CM | POA: Insufficient documentation

## 2019-02-08 LAB — COMPREHENSIVE METABOLIC PANEL
ALT: 19 U/L (ref 0–53)
AST: 24 U/L (ref 0–37)
Albumin: 4.4 g/dL (ref 3.5–5.2)
Alkaline Phosphatase: 68 U/L (ref 39–117)
BUN: 18 mg/dL (ref 6–23)
CO2: 28 mEq/L (ref 19–32)
Calcium: 9.4 mg/dL (ref 8.4–10.5)
Chloride: 104 mEq/L (ref 96–112)
Creatinine, Ser: 1.06 mg/dL (ref 0.40–1.50)
GFR: 67.07 mL/min (ref >60.00)
Glucose, Bld: 89 mg/dL (ref 70–99)
Potassium: 4.1 mEq/L (ref 3.5–5.1)
Sodium: 141 mEq/L (ref 135–145)
Total Bilirubin: 0.8 mg/dL (ref 0.2–1.2)
Total Protein: 7 g/dL (ref 6.0–8.3)

## 2019-02-08 LAB — CBC WITH DIFFERENTIAL/PLATELET
Basophils Absolute: 0.2 10*3/uL — ABNORMAL HIGH (ref 0.0–0.1)
Basophils Relative: 2.9 % (ref 0.0–3.0)
Eosinophils Absolute: 0.5 10*3/uL (ref 0.0–0.7)
Eosinophils Relative: 8 % — ABNORMAL HIGH (ref 0.0–5.0)
HCT: 44.3 % (ref 39.0–52.0)
Hemoglobin: 14.9 g/dL (ref 13.0–17.0)
Lymphocytes Relative: 21.9 % (ref 12.0–46.0)
Lymphs Abs: 1.3 10*3/uL (ref 0.7–4.0)
MCHC: 33.7 g/dL (ref 30.0–36.0)
MCV: 91.1 fl (ref 78.0–100.0)
Monocytes Absolute: 0.8 10*3/uL (ref 0.1–1.0)
Monocytes Relative: 14.3 % — ABNORMAL HIGH (ref 3.0–12.0)
Neutro Abs: 3.1 10*3/uL (ref 1.4–7.7)
Neutrophils Relative %: 52.9 % (ref 43.0–77.0)
Platelets: 213 10*3/uL (ref 150.0–400.0)
RBC: 4.86 Mil/uL (ref 4.22–5.81)
RDW: 15.8 % — ABNORMAL HIGH (ref 11.5–15.5)
WBC: 5.8 10*3/uL (ref 4.0–10.5)

## 2019-02-08 LAB — BRAIN NATRIURETIC PEPTIDE: Pro B Natriuretic peptide (BNP): 333 pg/mL — ABNORMAL HIGH (ref 0.0–100.0)

## 2019-02-08 LAB — TSH: TSH: 5.45 u[IU]/mL — ABNORMAL HIGH (ref 0.35–4.50)

## 2019-02-08 LAB — HEMOGLOBIN A1C: Hgb A1c MFr Bld: 6.3 % (ref 4.6–6.5)

## 2019-02-08 MED ORDER — LEVOTHYROXINE SODIUM 25 MCG PO TABS: 25.0000 ug | ORAL_TABLET | Freq: Every day | ORAL | 3 refills | Status: DC

## 2019-02-08 NOTE — Addendum Note (Signed)
Addended byDamita Dunnings D on: 02/08/2019 01:06 PM   Modules accepted: Orders

## 2019-02-08 NOTE — Progress Notes (Signed)
05/27 lab.. pt already had appt in August

## 2019-02-08 NOTE — Assessment & Plan Note (Signed)
Right lower extremity edema: He has chronic bilateral lower extremity edema, today is much worse on the right, history of right TKR in 2015, shortly after a [place] was done for swelling @ R leg and it was negative for DVT. He is actually taking Eliquis without apparent problems and with no miss doses.  Nevertheless, we will do a ultrasound to rule out DVT.  If negative will recommend leg elevation and low-salt diet.  Consider temporary increase of Lasix dose and stop CCB's. There is a question of JVD on exam however weight is stable, to be sure we will check a BNP Prediabetes: Check a A1c HTN: Currently on Lotensin, diltiazem, Lasix, metoprolol.  Upon arrival BP was elevated, recheck it was 134/75.  We will get a CMP and CBC High cholesterol: On Lipitor, check a FLP on RTC Hypothyroidism: On Synthroid, check a TSH Atrial fibrillation: Dx @ cardiology 09-2018, during a regular office visit, EF was satisfactory, was started on Eliquis.  Today he seems to be on an NSR on physical exam. RTC 3 months

## 2019-02-15 ENCOUNTER — Other Ambulatory Visit (INDEPENDENT_AMBULATORY_CARE_PROVIDER_SITE_OTHER): Payer: Medicare Other

## 2019-02-15 ENCOUNTER — Other Ambulatory Visit: Payer: Self-pay

## 2019-02-15 DIAGNOSIS — E039 Hypothyroidism, unspecified: Secondary | ICD-10-CM

## 2019-02-15 LAB — TSH: TSH: 4.09 u[IU]/mL (ref 0.35–4.50)

## 2019-02-17 NOTE — Addendum Note (Signed)
Addended byDamita Dunnings D on: 02/17/2019 01:31 PM   Modules accepted: Orders

## 2019-02-21 DIAGNOSIS — H401134 Primary open-angle glaucoma, bilateral, indeterminate stage: Secondary | ICD-10-CM | POA: Diagnosis not present

## 2019-02-21 DIAGNOSIS — H0100B Unspecified blepharitis left eye, upper and lower eyelids: Secondary | ICD-10-CM | POA: Diagnosis not present

## 2019-02-21 DIAGNOSIS — H0100A Unspecified blepharitis right eye, upper and lower eyelids: Secondary | ICD-10-CM | POA: Diagnosis not present

## 2019-02-22 ENCOUNTER — Telehealth: Payer: Self-pay | Admitting: Cardiovascular Disease

## 2019-02-22 NOTE — Telephone Encounter (Signed)
Called patient back about his message. Patient stated he had some swelling in his right leg last month and he went and saw his PCP, Dr. Larose Kells. Patient stated they did lab work (BMET & BNP), RLE venous duplex, and increased his Lasix. Patient stated his swelling has gone down, but his RLE is still larger around then his LLE by one inch. Patient stated he has been watching his salt in take, but he is just concerned and wanted to know what Dr. Johnsie Cancel would do. Informed patient that Dr. Larose Kells ran all the right test for his symptoms and Dr. Johnsie Cancel would have done the same. Will forward to Dr. Johnsie Cancel for further advisement if needed.

## 2019-02-22 NOTE — Telephone Encounter (Signed)
New Message:   He said he would like to  Dr Kyla Balzarine nurse. H said he wants her to relay some questions to Dr Johnsie Cancel please.Marland Kitchen

## 2019-02-22 NOTE — Telephone Encounter (Signed)
Agree 

## 2019-03-06 ENCOUNTER — Telehealth: Payer: Self-pay | Admitting: Cardiovascular Disease

## 2019-03-06 NOTE — Telephone Encounter (Signed)
Patient stated he has been picking his elquis up and paying only $47.00, today patient's stated his pharmacy charged him $155.00. Patient's pharmacy told him to call his doctor's office. Informed patient that we can look into patient assistance, and that he would need to call his insurance to see what they are doing on their end. Patient is not sure what caused the increase in price. Patient stated he went ahead and got the medication cause he only had one pill left, but he cannot continue to pay that much a month. Will send a message to our pre auth nurse to see if there is anything we can do to help him. Asked patient to find out when he talks to his insurance what medications would they cover as far as alternatives are concerned. Patient verbalized understanding.

## 2019-03-06 NOTE — Telephone Encounter (Signed)
New Message  Pt c/o medication issue:  1. Name of Medication: apixaban (ELIQUIS) 5 MG TABS tablet    2. How are you currently taking this medication (dosage and times per day)?   3. Are you having a reaction (difficulty breathing--STAT)?   4. What is your medication issue? Patient is calling in reference to the cost of the medication. He said it went up. It sounds as if his discount card has expired.

## 2019-03-07 NOTE — Telephone Encounter (Signed)
**Note De-Identified  Obfuscation** The pt has not called his insurance company yet and is unsure why the cost of his Eliquis has gone up. I advised him that it sounds like he has gone into the "Donut hole" based on the cost going up as much as it did. He does not feel he is in the donut hole yet and states that he is calling his insurance provider now and will call me back with an update.

## 2019-03-07 NOTE — Telephone Encounter (Signed)
**Note De-Identified  Obfuscation** The pt called back and stated that he contacted [employer] and was advised that he is in the donut hole.  I discussed and encouraged the pt to apply for pt assistance through [employer] but he states that he is not interested in applying for or providing the required documents needed for the pt asst program at this time.  I did advise him that if he apply's and is approved he will receive his Eliquis free of charge from [employer] for the remainder of this year but he again declined and stated "I guess I can afford it and just dont want to have to pay that much".  I mentioned Coumadin as it is less expensive but he is not interested in switching to another medications.  I have advised him that if he changes his mind about applying for pt asst with [employer] to call me back and I will be happy to help him fill out the application.  He thanked me for my help and stated that he will call back if he gets to the point that he can longer afford Eliquis.

## 2019-03-16 DIAGNOSIS — G8311 Monoplegia of lower limb affecting right dominant side: Secondary | ICD-10-CM | POA: Diagnosis not present

## 2019-03-17 ENCOUNTER — Other Ambulatory Visit: Payer: Self-pay | Admitting: Internal Medicine

## 2019-04-02 NOTE — Progress Notes (Signed)
Date:  04/06/2019   ID:  [name], DOB 1936/07/01, MRN [id number]   PCP:  Colon Branch, MD  Cardiologist:   Johnsie Cancel Electrophysiologist:  None   Evaluation Performed:  Follow-Up Visit  Chief Complaint:  CAD/AFib  History of Present Illness:    [name] is a 83 y.o.  male with a history of CAD s/p DES to LCx (2006), carotid artery disease, HTN, HLD who presents to clinic for follow up. No angina compliant with meds Normal nuclear study 2017 prior to back surgery. LE Edema from varicosities and previous TKR takes lasix for this. Dr Larose Kells did venous duplex for swelling 02/07/19 and no DVT Lasix increased   10/20/18  found to be in new onset afib. He did not notice. No palpitations, mild exertional dyspnea chronic No chest pain.   Started on Eliquis but issues with being in donut hole. Was not interested in applying to [employer] for assistance  CHA2DS2-Vasc is 4   He is asymptomatic Tolerating blood thinner No palpitations TTE done 10/13/18 reviewed EF normal 55-60% severe LAE  He is not interested in [facility] and would unlikely maintain NSR given LAE and age   He has venous reflux dx in right >left leg More swelling Supposed to see Dr Trula Slade [facility] Discussed increasing diuretic and wearing compression stocking he still has from knee surgery   The patient  does not have symptoms concerning for COVID-19 infection (fever, chills, cough, or new shortness of breath).    Past Medical History:  Diagnosis Date  . Arthritis   . BPH (benign prostatic hypertrophy)   . Carotid artery disease ([place])    Doppler, December 08, 2011, 00 49% R. ICA, 44-96% LICA, followup 1 year  . Coronary artery disease    DES Circumflex or CVA 2006  /  clear, February, 2011, EF 70%, no ischemia or  . Ejection fraction    EF 60%, echo, 2009, mildly calcified aortic leaflets  . History of colonic polyps   . Hyperlipidemia   . Hypertension   . Hypothyroidism   . Lumbar radiculopathy   . Multiple thyroid  nodules    Avascular echogenic areas noted in the right thyroid at the time of carotid Doppler.   Marland Kitchen PONV (postoperative nausea and vomiting)    "nausea with first hip surgery"  . Skin cancer (melanoma) ([facility])    PMH of   . Spinal stenosis, lumbar   . Tremor    Fine tremor right upper extremity  . Venous insufficiency    Past Surgical History:  Procedure Laterality Date  . COLONOSCOPY    . CORONARY ANGIOPLASTY WITH STENT PLACEMENT  2006   x 2 stents; DES to CX and dRCA '06  . KNEE SURGERY  1970's   "chipped bone"  . LUMBAR LAMINECTOMY/DECOMPRESSION MICRODISCECTOMY Left 07/13/2016   Procedure: LUMBAR LAMINECTOMY AND FORAMINOTOMY Lumbar two, three, Lumbar three-four, Lumbar four-five, LEFT Lumbar five-Sacral one DISECTOMY;  Surgeon: Newman Pies, MD;  Location: [place];  Service: Neurosurgery;  Laterality: Left;  . TOE SURGERY  1997  . TONSILLECTOMY    . TOTAL HIP ARTHROPLASTY Left 2007  . TOTAL HIP ARTHROPLASTY Right 2010  . TOTAL KNEE ARTHROPLASTY Right 06/25/2014   Procedure: RIGHT TOTAL KNEE ARTHROPLASTY;  Surgeon: Gearlean Alf, MD;  Location: [facility];  Service: Orthopedics;  Laterality: Right;     Current Meds  Medication Sig  . apixaban (ELIQUIS) 5 MG TABS tablet Take 1 tablet (5 mg total)  by mouth 2 (two) times daily.  Marland Kitchen aspirin 81 MG tablet Take 1 tablet (81 mg total) by mouth daily.  Marland Kitchen atorvastatin (LIPITOR) 40 MG tablet Take 0.5 tablets (20 mg total) by mouth at bedtime.  . benazepril (LOTENSIN) 20 MG tablet Take 1.5 tablets (30 mg total) by mouth daily.  Marland Kitchen diltiazem (DILT-XR) 180 MG 24 hr capsule Take 1 capsule (180 mg total) by mouth daily.  . furosemide (LASIX) 40 MG tablet Take 1 tablet (40 mg total) by mouth daily.  Marland Kitchen levothyroxine (SYNTHROID) 150 MCG tablet Take 1 tablet (150 mcg total) by mouth daily before breakfast.  . levothyroxine (SYNTHROID) 25 MCG tablet Take 1 tablet (25 mcg total) by mouth daily before breakfast.  . LUMIGAN 0.01 % SOLN Place 1 drop into  both eyes 2 (two) times daily.   . metoprolol tartrate (LOPRESSOR) 25 MG tablet Take 0.5 tablets (12.5 mg total) by mouth 2 (two) times daily. (BETA BLOCKER)  . nitroGLYCERIN (NITROSTAT) 0.4 MG SL tablet PLACE 1 TABLET UNDER THE TONGUE EVERY 5 MINUTES AS NEEDED FOR CHEST PAIN  . Timolol Maleate 0.5 % (DAILY) SOLN Apply 1 drop to eye daily.     Allergies:   Amlodipine besylate   Social History   Tobacco Use  . Smoking status: Former Smoker    Types: Cigarettes    Quit date: 09/21/1984    Years since quitting: 34.5  . Smokeless tobacco: Never Used  . Tobacco comment: smoked 1958-1986, up to 1 ppd  Substance Use Topics  . Alcohol use: Yes    Comment: socially on occasion  . Drug use: No     Family Hx: The patient's family history includes Colon cancer (age of onset: 21) in his father; Coronary artery disease in his mother; Diabetes in his maternal grandfather and sister; Heart attack (age of onset: 59) in his sister; Heart attack (age of onset: 6) in his maternal grandfather; Hypertension in his father; Pancreatitis in his father. There is no history of Thyroid disease, Stroke, or Prostate cancer.  ROS:   Please see the history of present illness.     All other systems reviewed and are negative.   Prior CV studies:   The following studies were reviewed today:  Venous Duplex 02/07/19 TTE 10/13/18   Labs/Other Tests and Data Reviewed:    EKG:  Not performed during tele visit   Recent Labs: 02/07/2019: ALT 19; BUN 18; Creatinine, Ser 1.06; Hemoglobin 14.9; Platelets 213.0; Potassium 4.1; Pro B Natriuretic peptide (BNP) 333.0; Sodium 141 02/15/2019: TSH 4.09   Recent Lipid Panel Lab Results  Component Value Date/Time   CHOL 144 11/03/2017 11:26 AM   CHOL 161 10/31/2014 08:24 AM   TRIG 93.0 11/03/2017 11:26 AM   TRIG 81 10/31/2014 08:24 AM   TRIG 86 07/23/2006 10:12 AM   HDL 46.30 11/03/2017 11:26 AM   HDL 49 10/31/2014 08:24 AM   CHOLHDL 3 11/03/2017 11:26 AM   LDLCALC  79 11/03/2017 11:26 AM   LDLCALC 96 10/31/2014 08:24 AM    Wt Readings from Last 3 Encounters:  04/06/19 194 lb (88 kg)  02/07/19 196 lb 4 oz (89 kg)  10/20/18 196 lb (88.9 kg)     Objective:    Vital Signs:  BP 138/76   Pulse 73   Ht 5\' 9"  (1.753 m)   Wt 194 lb (88 kg)   SpO2 96%   BMI 28.65 kg/m    Affect appropriate Healthy:  appears stated age 33: normal Neck  supple with no adenopathy JVP normal no bruits no thyromegaly Lungs clear with no wheezing and good diaphragmatic motion Heart:  S1/S2 no murmur, no rub, gallop or click PMI normal Abdomen: benighn, BS positve, no tenderness, no AAA no bruit.  No HSM or HJR Distal pulses intact with no bruits Plus 2 RLE edema plus one LLE with varicosities Neuro non-focal Skin warm and dry No muscular weakness    ASSESSMENT & PLAN:    LE edema: increase lasix 60 bid BMET in 3 weeks f/u Jenell Milliner  CAD: DES to circumflex 2006  stable with low risk Myoview 03/31/16 . Continue ASA, statin and BB  Carotid artery disease: stable doppler study in 01/10/18  Plaque no significant stenosis . Repeat due in 2 years ( 12/2019 )  HLD: continue statin. Followed by Dr. Larose Kells  Hypothyroidism: continue synthroid followed by Dr. Irma Newness:  Post right TKR and lumbar surgery    Afib:  New diagnosis  09/28/18 On Eliquis 5 bid for CHADVASC 4.  Continue cardizem for rate control Given asymptomatic state normal EF by TTE 10/13/18 And severe LAE will adopt strategy of rate control and anticoagulation and not aggressive [facility]  COVID-19 Education: The signs and symptoms of COVID-19 were discussed with the patient and how to seek care for testing (follow up with PCP or arrange E-visit).  The importance of social distancing was discussed today.  Time:   Today, I have spent 30 minutes with the patient     Medication Adjustments/Labs and Tests Ordered: Current medicines are reviewed at length with the patient today.  Concerns regarding  medicines are outlined above.   Tests Ordered:  None  Medication Changes:  None   Disposition:  Follow up in 6 months  Signed, Jenkins Rouge, MD  04/06/2019 10:20 AM    [place]

## 2019-04-03 ENCOUNTER — Telehealth: Payer: Self-pay

## 2019-04-03 NOTE — Telephone Encounter (Signed)
Spoke with patient who states he would like to change virtual visit appointment  With Dr. Johnsie Cancel on 7/16 to in office.      COVID-19 Pre-Screening Questions:  . In the past 7 to 10 days have you had a cough,  shortness of breath, headache, congestion, fever (100 or greater) body aches, chills, sore throat, or sudden loss of taste or sense of smell? No . Have you been around anyone with known Covid 19.  No . Have you been around anyone who is awaiting Covid 19 test results in the past 7 to 10 days? No . Have you been around anyone who has been exposed to Covid 19, or has mentioned symptoms of Covid 19 within the past 7 to 10 days? No  If you have any concerns/questions about symptoms patients report during screening (either on the phone or at threshold). Contact the provider seeing the patient or DOD for further guidance.  If neither are available contact a member of the leadership team.

## 2019-04-06 ENCOUNTER — Other Ambulatory Visit: Payer: Self-pay

## 2019-04-06 ENCOUNTER — Other Ambulatory Visit: Payer: Self-pay | Admitting: *Deleted

## 2019-04-06 ENCOUNTER — Encounter: Payer: Self-pay | Admitting: Cardiovascular Disease

## 2019-04-06 ENCOUNTER — Ambulatory Visit: Payer: Medicare Other | Admitting: Cardiovascular Disease

## 2019-04-06 VITALS — BP 138/76 | HR 73 | Ht 69.0 in | Wt 194.0 lb

## 2019-04-06 DIAGNOSIS — I10 Essential (primary) hypertension: Secondary | ICD-10-CM

## 2019-04-06 DIAGNOSIS — R601 Generalized edema: Secondary | ICD-10-CM | POA: Diagnosis not present

## 2019-04-06 DIAGNOSIS — R609 Edema, unspecified: Secondary | ICD-10-CM | POA: Diagnosis not present

## 2019-04-06 MED ORDER — FUROSEMIDE 40 MG PO TABS: 60.0000 mg | ORAL_TABLET | Freq: Two times a day (BID) | ORAL | 3 refills | Status: DC

## 2019-04-06 NOTE — Patient Instructions (Addendum)
Your physician has recommended you make the following change in your medication:  INCREASE FUROSEMIDE TO 60 MG TWICE DAILY  Your physician recommends that you return for lab work in:  [place] wants you to follow-up in:  [place] will receive a reminder letter in the mail two months in advance. If you don't receive a letter, please call our office to schedule the follow-up appointment.

## 2019-04-10 DIAGNOSIS — M48062 Spinal stenosis, lumbar region with neurogenic claudication: Secondary | ICD-10-CM | POA: Diagnosis not present

## 2019-04-13 DIAGNOSIS — M47816 Spondylosis without myelopathy or radiculopathy, lumbar region: Secondary | ICD-10-CM | POA: Diagnosis not present

## 2019-04-13 DIAGNOSIS — M48062 Spinal stenosis, lumbar region with neurogenic claudication: Secondary | ICD-10-CM | POA: Diagnosis not present

## 2019-04-13 DIAGNOSIS — M5127 Other intervertebral disc displacement, lumbosacral region: Secondary | ICD-10-CM | POA: Diagnosis not present

## 2019-04-14 ENCOUNTER — Other Ambulatory Visit: Payer: Self-pay | Admitting: Internal Medicine

## 2019-04-27 ENCOUNTER — Other Ambulatory Visit: Payer: Medicare Other | Admitting: *Deleted

## 2019-04-27 ENCOUNTER — Other Ambulatory Visit: Payer: Self-pay

## 2019-04-27 DIAGNOSIS — I10 Essential (primary) hypertension: Secondary | ICD-10-CM

## 2019-04-27 DIAGNOSIS — C44519 Basal cell carcinoma of skin of other part of trunk: Secondary | ICD-10-CM | POA: Diagnosis not present

## 2019-04-27 LAB — BASIC METABOLIC PANEL
BUN/Creatinine Ratio: 19 (ref 10–24)
BUN: 22 mg/dL (ref 8–27)
CO2: 22 mmol/L (ref 20–29)
Calcium: 9.7 mg/dL (ref 8.6–10.2)
Chloride: 101 mmol/L (ref 96–106)
Creatinine, Ser: 1.13 mg/dL (ref 0.76–1.27)
GFR calc Af Amer: 70 mL/min/{1.73_m2} (ref >59)
GFR calc non Af Amer: 61 mL/min/{1.73_m2} (ref >59)
Glucose: 97 mg/dL (ref 65–99)
Potassium: 4.1 mmol/L (ref 3.5–5.2)
Sodium: 141 mmol/L (ref 134–144)

## 2019-04-28 DIAGNOSIS — M545 Low back pain: Secondary | ICD-10-CM | POA: Diagnosis not present

## 2019-04-28 DIAGNOSIS — I10 Essential (primary) hypertension: Secondary | ICD-10-CM | POA: Diagnosis not present

## 2019-05-01 DIAGNOSIS — L218 Other seborrheic dermatitis: Secondary | ICD-10-CM | POA: Diagnosis not present

## 2019-05-01 DIAGNOSIS — I872 Venous insufficiency (chronic) (peripheral): Secondary | ICD-10-CM | POA: Diagnosis not present

## 2019-05-09 ENCOUNTER — Other Ambulatory Visit: Payer: Self-pay

## 2019-05-10 ENCOUNTER — Ambulatory Visit (INDEPENDENT_AMBULATORY_CARE_PROVIDER_SITE_OTHER): Payer: Medicare Other | Admitting: Internal Medicine

## 2019-05-10 ENCOUNTER — Encounter: Payer: Self-pay | Admitting: Internal Medicine

## 2019-05-10 VITALS — BP 146/71 | HR 53 | Temp 96.9°F | Resp 18 | Ht 69.0 in | Wt 186.2 lb

## 2019-05-10 DIAGNOSIS — E782 Mixed hyperlipidemia: Secondary | ICD-10-CM

## 2019-05-10 DIAGNOSIS — I482 Chronic atrial fibrillation, unspecified: Secondary | ICD-10-CM | POA: Diagnosis not present

## 2019-05-10 DIAGNOSIS — I10 Essential (primary) hypertension: Secondary | ICD-10-CM | POA: Diagnosis not present

## 2019-05-10 DIAGNOSIS — E039 Hypothyroidism, unspecified: Secondary | ICD-10-CM

## 2019-05-10 LAB — LIPID PANEL
Cholesterol: 126 mg/dL (ref 0–200)
HDL: 41.1 mg/dL (ref >39.00)
LDL Cholesterol: 62 mg/dL (ref 0–99)
NonHDL: 84.62
Total CHOL/HDL Ratio: 3
Triglycerides: 114 mg/dL (ref 0.0–149.0)
VLDL: 22.8 mg/dL (ref 0.0–40.0)

## 2019-05-10 LAB — TSH: TSH: 0.68 u[IU]/mL (ref 0.35–4.50)

## 2019-05-10 MED ORDER — HYDROCORTISONE 2.5 % EX CREA: TOPICAL_CREAM | Freq: Two times a day (BID) | CUTANEOUS | 1 refills | Status: AC | PRN

## 2019-05-10 NOTE — Progress Notes (Signed)
Subjective:    Patient ID: [name], male    DOB: 11/03/1935, 83 y.o.   MRN: [id number]  DOS:  05/10/2019 Type of visit - description: Routine visit Since the last visit saw cardiology, Lasix increased. Lower extremity edema improved. Good compliance with thyroid medication  BP Readings from Last 3 Encounters:  05/10/19 (!) 146/71  04/06/19 138/76  02/07/19 134/75   Wt Readings from Last 3 Encounters:  05/10/19 186 lb 4 oz (84.5 kg)  04/06/19 194 lb (88 kg)  02/07/19 196 lb 4 oz (89 kg)     Review of Systems Denies fever chills No chest pain no difficulty breathing No nausea, vomiting, diarrhea  Past Medical History:  Diagnosis Date   Arthritis    BPH (benign prostatic hypertrophy)    Carotid artery disease ([place])    Doppler, December 08, 2011, 00 77% R. ICA, 41-28% LICA, followup 1 year   Coronary artery disease    DES Circumflex or CVA 2006  /  clear, February, 2011, EF 70%, no ischemia or   Ejection fraction    EF 60%, echo, 2009, mildly calcified aortic leaflets   History of colonic polyps    Hyperlipidemia    Hypertension    Hypothyroidism    Lumbar radiculopathy    Multiple thyroid nodules    Avascular echogenic areas noted in the right thyroid at the time of carotid Doppler.    PONV (postoperative nausea and vomiting)    "nausea with first hip surgery"   Skin cancer (melanoma) ([place])    PMH of    Spinal stenosis, lumbar    Tremor    Fine tremor right upper extremity   Venous insufficiency     Past Surgical History:  Procedure Laterality Date   COLONOSCOPY     CORONARY ANGIOPLASTY WITH STENT PLACEMENT  2006   x 2 stents; DES to CX and dRCA '06   KNEE SURGERY  1970's   "chipped bone"   LUMBAR LAMINECTOMY/DECOMPRESSION MICRODISCECTOMY Left 07/13/2016   Procedure: LUMBAR LAMINECTOMY AND FORAMINOTOMY Lumbar two, three, Lumbar three-four, Lumbar four-five, LEFT Lumbar five-Sacral one DISECTOMY;  Surgeon: Newman Pies, MD;   Location: [place];  Service: Neurosurgery;  Laterality: Left;   TOE SURGERY  1997   TONSILLECTOMY     TOTAL HIP ARTHROPLASTY Left 2007   TOTAL HIP ARTHROPLASTY Right 2010   TOTAL KNEE ARTHROPLASTY Right 06/25/2014   Procedure: RIGHT TOTAL KNEE ARTHROPLASTY;  Surgeon: Gearlean Alf, MD;  Location: [facility];  Service: Orthopedics;  Laterality: Right;    Social History   Socioeconomic History   Marital status: Married    Spouse name: Not on file   Number of children: 2   Years of education: Not on file   Highest education level: Not on file  Occupational History   Occupation: retired, Administrator, arts strain: Not on file   Food insecurity    Worry: Not on file    Inability: Not on file   Transportation needs    Medical: Not on file    Non-medical: Not on file  Tobacco Use   Smoking status: Former Smoker    Types: Cigarettes    Quit date: 09/21/1984    Years since quitting: 34.6   Smokeless tobacco: Never Used   Tobacco comment: smoked 1958-1986, up to 1 ppd  Substance and Sexual Activity   Alcohol use: Yes    Comment: socially on occasion   Drug use:  No   Sexual activity: Not on file  Lifestyle   Physical activity    Days per week: Not on file    Minutes per session: Not on file   Stress: Not on file  Relationships   Social connections    Talks on phone: Not on file    Gets together: Not on file    Attends religious service: Not on file    Active member of club or organization: Not on file    Attends meetings of clubs or organizations: Not on file    Relationship status: Not on file   Intimate partner violence    Fear of current or ex partner: Not on file    Emotionally abused: Not on file    Physically abused: Not on file    Forced sexual activity: Not on file  Other Topics Concern   Not on file  Social History Narrative   2 daughters, one is a Therapist, sports @ [employer]      Allergies as of  05/10/2019      Reactions   Amlodipine Besylate Other (See Comments)   REACTION: edema      Medication List       Accurate as of May 10, 2019 11:59 PM. If you have any questions, ask your nurse or doctor.        apixaban 5 MG Tabs tablet Commonly known as: Eliquis Take 1 tablet (5 mg total) by mouth 2 (two) times daily.   aspirin 81 MG tablet Take 1 tablet (81 mg total) by mouth daily.   atorvastatin 40 MG tablet Commonly known as: LIPITOR Take 0.5 tablets (20 mg total) by mouth at bedtime.   benazepril 20 MG tablet Commonly known as: LOTENSIN Take 1.5 tablets (30 mg total) by mouth daily.   diltiazem 180 MG 24 hr capsule Commonly known as: Dilt-XR Take 1 capsule (180 mg total) by mouth daily.   furosemide 40 MG tablet Commonly known as: LASIX Take 1.5 tablets (60 mg total) by mouth 2 (two) times daily.   hydrocortisone 2.5 % cream Apply topically 2 (two) times daily as needed. Started by: [name], MD   levothyroxine 150 MCG tablet Commonly known as: SYNTHROID Take 1 tablet (150 mcg total) by mouth daily before breakfast.   levothyroxine 25 MCG tablet Commonly known as: SYNTHROID Take 1 tablet (25 mcg total) by mouth daily before breakfast.   Lumigan 0.01 % Soln Generic drug: bimatoprost Place 1 drop into both eyes 2 (two) times daily.   metoprolol tartrate 25 MG tablet Commonly known as: LOPRESSOR Take 0.5 tablets (12.5 mg total) by mouth 2 (two) times daily. (BETA BLOCKER)   nitroGLYCERIN 0.4 MG SL tablet Commonly known as: NITROSTAT PLACE 1 TABLET UNDER THE TONGUE EVERY 5 MINUTES AS NEEDED FOR CHEST PAIN   Timolol Maleate 0.5 % (DAILY) Soln Apply 1 drop to eye daily.           Objective:   Physical Exam BP (!) 146/71 (BP Location: Left Arm, Patient Position: Sitting, Cuff Size: Small)    Pulse (!) 53    Temp (!) 96.9 F (36.1 C) (Temporal)    Resp 18    Ht 5\' 9"  (1.753 m)    Wt 186 lb 4 oz (84.5 kg)    SpO2 96%    BMI 27.50 kg/m  General:    Well developed, NAD, BMI noted. HEENT:  Normocephalic . Face symmetric, atraumatic Lungs:  CTA B Normal respiratory effort, no intercostal retractions, no  accessory muscle use. Heart: Bradycardic Currently with no pitting edema.  Right calf remains slightly larger Skin: Not pale. Not jaundice Neurologic:  alert & oriented X3.  Speech normal, gait appropriate for age and unassisted Psych--  Cognition and judgment appear intact.  Cooperative with normal attention span and concentration.  Behavior appropriate. No anxious or depressed appearing.      Assessment     Assessment  (Transfer from  Dr. Linna Darner [phone]) Diabetes, A1c 6.2 2013 HTN Hyperlipidemia Hypothyroidism  Thyroid US 2014-- no nodules, inhomogeneous  CV: --CAD: stents 2006;  low risk nuclear scan 2011, 03-2016 --A Fib dx 09/2018 at regular crads OV, on Eliquis, EF --Carotid artery disease [place] 12/2014:   Stable, 1-39% RICA stenosis. Stable, 20-23% LICA stenosis. [place] 12/2015: Next 2 years R leg edema, [place] (-)   DVT 08-2014 and 01/2019 [facility] DJD: Dr Maureen Ralphs, back surgery Dr Arnoldo Morale 06-2016 Venous insufficiency, chronic R>L edema Tremor Glaucoma H/o skin cancer , denies Melanoma; sees derm, had a visit ~ 09/2017, Dr [place] Crane  PLAN:  HTN: Currently on Lotensin, diltiazem, Lasix, metoprolol.  Controlled.  Recommend to monitor ambulatory BPs Hypothyroidism: last TSH was slt elevated on Synthroid 150 mcg, was rec  to add 25 mcg daily   (previously 200 mcg was too much), checking a TSH today. Lower extremity edema: Recently, cardiology increase Lasix, subsequent BMP normal, edema definitely decreased, weight has also decreased. High cholesterol: On Lipitor, check a FLP. Hydrocortisone 2.5%: Request a prescription to use as needed mosquito bites.  Prescription sent Preventive care: Recommend flu shot early this fall RTC CPX 5 months

## 2019-05-10 NOTE — Patient Instructions (Addendum)
Please schedule Medicare Wellness with Glenard Haring.   GO TO THE LAB : Get the blood work     GO TO THE FRONT DESK Schedule your next appointment   for a physical exam in 5 months    Check the  blood pressure weekly BP GOAL is between 110/65 and  135/85. If it is consistently higher or lower, let me know

## 2019-05-10 NOTE — Progress Notes (Signed)
Pre visit review using our clinic review tool, if applicable. No additional management support is needed unless otherwise documented below in the visit note. 

## 2019-05-11 NOTE — Assessment & Plan Note (Signed)
HTN: Currently on Lotensin, diltiazem, Lasix, metoprolol.  Controlled.  Recommend to monitor ambulatory BPs Hypothyroidism: last TSH was slt elevated on Synthroid 150 mcg, was rec  to add 25 mcg daily   (previously 200 mcg was too much), checking a TSH today. Lower extremity edema: Recently, cardiology increase Lasix, subsequent BMP normal, edema definitely decreased, weight has also decreased. High cholesterol: On Lipitor, check a FLP. Hydrocortisone 2.5%: Request a prescription to use as needed mosquito bites.  Prescription sent Preventive care: Recommend flu shot early this fall RTC CPX 5 months

## 2019-05-18 ENCOUNTER — Other Ambulatory Visit: Payer: Self-pay

## 2019-05-18 DIAGNOSIS — R600 Localized edema: Secondary | ICD-10-CM

## 2019-05-18 DIAGNOSIS — M47816 Spondylosis without myelopathy or radiculopathy, lumbar region: Secondary | ICD-10-CM | POA: Diagnosis not present

## 2019-05-18 DIAGNOSIS — M48062 Spinal stenosis, lumbar region with neurogenic claudication: Secondary | ICD-10-CM | POA: Diagnosis not present

## 2019-05-18 DIAGNOSIS — I10 Essential (primary) hypertension: Secondary | ICD-10-CM | POA: Diagnosis not present

## 2019-05-19 ENCOUNTER — Ambulatory Visit (INDEPENDENT_AMBULATORY_CARE_PROVIDER_SITE_OTHER): Payer: Medicare Other | Admitting: Vascular Surgery

## 2019-05-19 ENCOUNTER — Ambulatory Visit (HOSPITAL_COMMUNITY)
Admission: RE | Admit: 2019-05-19 | Discharge: 2019-05-19 | Disposition: A | Discharge status: Home / Self Care | Payer: Medicare Other | Source: Ambulatory Visit | Attending: Family | Admitting: Family

## 2019-05-19 ENCOUNTER — Other Ambulatory Visit: Payer: Self-pay

## 2019-05-19 ENCOUNTER — Encounter: Payer: Self-pay | Admitting: Vascular Surgery

## 2019-05-19 VITALS — BP 125/69 | HR 52 | Temp 97.3°F | Resp 20 | Ht 69.0 in | Wt 183.0 lb

## 2019-05-19 DIAGNOSIS — R600 Localized edema: Secondary | ICD-10-CM | POA: Insufficient documentation

## 2019-05-19 NOTE — Progress Notes (Signed)
Patient ID: [name], male   DOB: [birth year], 83 y.o.   MRN: [id number]  Reason for Consult: New Patient (Initial Visit)   Referred by Colon Branch, MD  Subjective:     HPI:  [name] is a 83 y.o. male history of bilateral hip and right knee replacements.  Has significant right greater than left lower extremity swelling.  States that this is improved with increasing his diuretic.  Is never had ulceration does not have skin changes.  Does not wear compression stockings at this time.  Is never had DVT that he knows of.  Walks without limitation.  Past Medical History:  Diagnosis Date  . Arthritis   . BPH (benign prostatic hypertrophy)   . Carotid artery disease ([place])    Doppler, December 08, 2011, 00 [id number] R. ICA, [id number] LICA, followup 1 year  . Coronary artery disease    DES Circumflex or CVA 2006  /  clear, February, 2011, EF 70%, no ischemia or  . Ejection fraction    EF 60%, echo, 2009, mildly calcified aortic leaflets  . History of colonic polyps   . Hyperlipidemia   . Hypertension   . Hypothyroidism   . Lumbar radiculopathy   . Multiple thyroid nodules    Avascular echogenic areas noted in the right thyroid at the time of carotid Doppler.   Marland Kitchen PONV (postoperative nausea and vomiting)    "nausea with first hip surgery"  . Skin cancer (melanoma) (HCC)    PMH of   . Spinal stenosis, lumbar   . Tremor    Fine tremor right upper extremity  . Venous insufficiency    Family History  Problem Relation Age of Onset  . Coronary artery disease Mother        carotid endarterectomy bilaterally  . Colon cancer Father 56  . Hypertension Father   . Pancreatitis Father   . Diabetes Sister   . Diabetes Maternal Grandfather   . Heart attack Maternal Grandfather 70  . Heart attack Sister 23  . Thyroid disease Neg Hx   . Stroke Neg Hx   . Prostate cancer Neg Hx    Past Surgical History:  Procedure Laterality Date  . COLONOSCOPY    . CORONARY ANGIOPLASTY WITH STENT  PLACEMENT  2006   x 2 stents; DES to CX and dRCA '06  . KNEE SURGERY  1970's   "chipped bone"  . LUMBAR LAMINECTOMY/DECOMPRESSION MICRODISCECTOMY Left 07/13/2016   Procedure: LUMBAR LAMINECTOMY AND FORAMINOTOMY Lumbar two, three, Lumbar three-four, Lumbar four-five, LEFT Lumbar five-Sacral one DISECTOMY;  Surgeon: Newman Pies, MD;  Location: [facility];  Service: Neurosurgery;  Laterality: Left;  . TOE SURGERY  1997  . TONSILLECTOMY    . TOTAL HIP ARTHROPLASTY Left 2007  . TOTAL HIP ARTHROPLASTY Right 2010  . TOTAL KNEE ARTHROPLASTY Right 06/25/2014   Procedure: RIGHT TOTAL KNEE ARTHROPLASTY;  Surgeon: Gearlean Alf, MD;  Location: [facility];  Service: Orthopedics;  Laterality: Right;    Short Social History:  Social History   Tobacco Use  . Smoking status: Former Smoker    Types: Cigarettes    Quit date: 09/21/1984    Years since quitting: 34.6  . Smokeless tobacco: Never Used  . Tobacco comment: smoked 1958-1986, up to 1 ppd  Substance Use Topics  . Alcohol use: Yes    Comment: socially on occasion    Allergies  Allergen Reactions  . Amlodipine Besylate Other (See Comments)    REACTION: edema  Current Outpatient Medications  Medication Sig Dispense Refill  . apixaban (ELIQUIS) 5 MG TABS tablet Take 1 tablet (5 mg total) by mouth 2 (two) times daily. 60 tablet 11  . aspirin 81 MG tablet Take 1 tablet (81 mg total) by mouth daily.    Marland Kitchen atorvastatin (LIPITOR) 40 MG tablet Take 0.5 tablets (20 mg total) by mouth at bedtime. 45 tablet 3  . benazepril (LOTENSIN) 20 MG tablet Take 1.5 tablets (30 mg total) by mouth daily. 135 tablet 1  . diltiazem (DILT-XR) 180 MG 24 hr capsule Take 1 capsule (180 mg total) by mouth daily. 90 capsule 3  . furosemide (LASIX) 40 MG tablet Take 1.5 tablets (60 mg total) by mouth 2 (two) times daily. 180 tablet 3  . hydrocortisone 2.5 % cream Apply topically 2 (two) times daily as needed. 30 g 1  . levothyroxine (SYNTHROID) 150 MCG tablet Take 1  tablet (150 mcg total) by mouth daily before breakfast. 30 tablet 5  . levothyroxine (SYNTHROID) 25 MCG tablet Take 1 tablet (25 mcg total) by mouth daily before breakfast. 30 tablet 3  . LUMIGAN 0.01 % SOLN Place 1 drop into both eyes 2 (two) times daily.     . metoprolol tartrate (LOPRESSOR) 25 MG tablet Take 0.5 tablets (12.5 mg total) by mouth 2 (two) times daily. (BETA BLOCKER) 90 tablet 1  . nitroGLYCERIN (NITROSTAT) 0.4 MG SL tablet PLACE 1 TABLET UNDER THE TONGUE EVERY 5 MINUTES AS NEEDED FOR CHEST PAIN 25 tablet 0  . Timolol Maleate 0.5 % (DAILY) SOLN Apply 1 drop to eye daily.     No current facility-administered medications for this visit.     Review of Systems  Constitutional:  Constitutional negative. HENT: HENT negative.  Eyes: Eyes negative.  Cardiovascular: Positive for leg swelling.  GI: Gastrointestinal negative.  Musculoskeletal: Musculoskeletal negative.  Skin: Skin negative.  Neurological: Neurological negative. Hematologic: Hematologic/lymphatic negative.  Psychiatric: Psychiatric negative.        Objective:  Objective   Vitals:   05/19/19 1350  BP: 125/69  Pulse: (!) 52  Resp: 20  Temp: (!) 97.3 F (36.3 C)  SpO2: 95%  Weight: 183 lb (83 kg)  Height: 5\' 9"  (1.753 m)   Body mass index is 27.02 kg/m.  Physical Exam HENT:     Head: Normocephalic.     Nose: Nose normal.     Mouth/Throat:     Mouth: Mucous membranes are moist.  Eyes:     Extraocular Movements: Extraocular movements intact.     Pupils: Pupils are equal, round, and reactive to light.  Neck:     Musculoskeletal: Normal range of motion and neck supple.  Cardiovascular:     Rate and Rhythm: Normal rate.     Pulses:          Radial pulses are 2+ on the right side and 2+ on the left side.       Popliteal pulses are 2+ on the right side and 2+ on the left side.  Abdominal:     General: Abdomen is flat.     Palpations: Abdomen is soft.  Musculoskeletal:     Right lower leg: Edema  present.     Left lower leg: Edema present.  Skin:    Capillary Refill: Capillary refill takes less than 2 seconds.  Neurological:     General: No focal deficit present.     Mental Status: He is alert and oriented to person, place, and time.  Psychiatric:  Mood and Affect: Mood normal.        Thought Content: Thought content normal.        Judgment: Judgment normal.     Data: Awaiting palliative with his lower extremity reflux studies which demonstrates reflux in his saphenous veins bilaterally on the right 939 ms on the left 4320 ms.  Saphenous vein at the junction on the right 0.57 cm on the left 0.36 cm.     Assessment/Plan:     83 year old male with bilateral extremity swelling that is improved with increasing his diuretic.  Swelling about 1+ on today's exam is not bothersome to the patient.  Does not really have large enough saphenous veins on either side for intervention.  I recommended compression stockings at least knee-high.  He should elevate his legs when recumbent.  He can follow-up on an as-needed basis.     Waynetta Sandy MD [facility]

## 2019-05-31 DIAGNOSIS — I10 Essential (primary) hypertension: Secondary | ICD-10-CM | POA: Diagnosis not present

## 2019-05-31 DIAGNOSIS — M47816 Spondylosis without myelopathy or radiculopathy, lumbar region: Secondary | ICD-10-CM | POA: Diagnosis not present

## 2019-05-31 DIAGNOSIS — R030 Elevated blood-pressure reading, without diagnosis of hypertension: Secondary | ICD-10-CM | POA: Diagnosis not present

## 2019-05-31 NOTE — Progress Notes (Signed)
Date:  06/09/2019   ID:  [name], DOB November 21, 1935, MRN [id number]   PCP:  Colon Branch, MD  Cardiologist:   Johnsie Cancel Electrophysiologist:  None   Evaluation Performed:  Follow-Up Visit  Chief Complaint:  CAD/AFib  History of Present Illness:    DAMACIO CLAVERIE is a 83 y.o.  male with a history of CAD s/p DES to LCx (2006), carotid artery disease, HTN, HLD who presents to clinic for follow up. No angina compliant with meds Normal nuclear study 2017 prior to back surgery. LE Edema from varicosities and previous TKR takes lasix for this. Dr Larose Kells did venous duplex for swelling 02/07/19 and no DVT Lasix increased   10/20/18  found to be in new onset afib. He did not notice. No palpitations, mild exertional dyspnea chronic No chest pain.   Started on Eliquis but issues with being in donut hole. Was not interested in applying to [facility] for assistance  CHA2DS2-Vasc is 4   He is asymptomatic Tolerating blood thinner No palpitations TTE done 10/13/18 reviewed EF normal 55-60% severe LAE  He is not interested in [facility] and would unlikely maintain NSR given LAE and age   He has venous reflux dx in right >left leg Improved with higher dose diuretic seen by Dr Donzetta Matters [facility] 05/19/19 and felt saphenous veins too small for intervention and would Rx conservatively with elevation, compression stockings and diuretics  Had injections for back with Dr Arnoldo Morale good relief of pain   The patient  does not have symptoms concerning for COVID-19 infection (fever, chills, cough, or new shortness of breath).    Past Medical History:  Diagnosis Date  . Arthritis   . BPH (benign prostatic hypertrophy)   . Carotid artery disease ([place])    Doppler, December 08, 2011, 00 [id number] R. ICA, [id number] LICA, followup 1 year  . Coronary artery disease    DES Circumflex or CVA 2006  /  clear, February, 2011, EF 70%, no ischemia or  . Ejection fraction    EF 60%, echo, 2009, mildly calcified aortic leaflets  . History of  colonic polyps   . Hyperlipidemia   . Hypertension   . Hypothyroidism   . Lumbar radiculopathy   . Multiple thyroid nodules    Avascular echogenic areas noted in the right thyroid at the time of carotid Doppler.   Marland Kitchen PONV (postoperative nausea and vomiting)    "nausea with first hip surgery"  . Skin cancer (melanoma) (HCC)    PMH of   . Spinal stenosis, lumbar   . Tremor    Fine tremor right upper extremity  . Venous insufficiency    Past Surgical History:  Procedure Laterality Date  . COLONOSCOPY    . CORONARY ANGIOPLASTY WITH STENT PLACEMENT  2006   x 2 stents; DES to CX and dRCA '06  . KNEE SURGERY  1970's   "chipped bone"  . LUMBAR LAMINECTOMY/DECOMPRESSION MICRODISCECTOMY Left 07/13/2016   Procedure: LUMBAR LAMINECTOMY AND FORAMINOTOMY Lumbar two, three, Lumbar three-four, Lumbar four-five, LEFT Lumbar five-Sacral one DISECTOMY;  Surgeon: Newman Pies, MD;  Location: [place];  Service: Neurosurgery;  Laterality: Left;  . TOE SURGERY  1997  . TONSILLECTOMY    . TOTAL HIP ARTHROPLASTY Left 2007  . TOTAL HIP ARTHROPLASTY Right 2010  . TOTAL KNEE ARTHROPLASTY Right 06/25/2014   Procedure: RIGHT TOTAL KNEE ARTHROPLASTY;  Surgeon: Gearlean Alf, MD;  Location: [facility];  Service: Orthopedics;  Laterality: Right;  Current Meds  Medication Sig  . apixaban (ELIQUIS) 5 MG TABS tablet Take 1 tablet (5 mg total) by mouth 2 (two) times daily.  Marland Kitchen aspirin 81 MG tablet Take 1 tablet (81 mg total) by mouth daily.  Marland Kitchen atorvastatin (LIPITOR) 40 MG tablet Take 0.5 tablets (20 mg total) by mouth at bedtime.  . benazepril (LOTENSIN) 20 MG tablet Take 1.5 tablets (30 mg total) by mouth daily.  Marland Kitchen diltiazem (DILT-XR) 180 MG 24 hr capsule Take 1 capsule (180 mg total) by mouth daily.  . furosemide (LASIX) 40 MG tablet Take 1.5 tablets (60 mg total) by mouth 2 (two) times daily.  . hydrocortisone 2.5 % cream Apply topically 2 (two) times daily as needed.  Marland Kitchen levothyroxine (SYNTHROID) 25 MCG  tablet Take 1 tablet (25 mcg total) by mouth daily before breakfast.  . LUMIGAN 0.01 % SOLN Place 1 drop into both eyes 2 (two) times daily.   . metoprolol tartrate (LOPRESSOR) 25 MG tablet Take 0.5 tablets (12.5 mg total) by mouth 2 (two) times daily. (BETA BLOCKER)  . nitroGLYCERIN (NITROSTAT) 0.4 MG SL tablet PLACE 1 TABLET UNDER THE TONGUE EVERY 5 MINUTES AS NEEDED FOR CHEST PAIN  . Timolol Maleate 0.5 % (DAILY) SOLN Apply 1 drop to eye daily.     Allergies:   Amlodipine besylate   Social History   Tobacco Use  . Smoking status: Former Smoker    Types: Cigarettes    Quit date: 09/21/1984    Years since quitting: 34.7  . Smokeless tobacco: Never Used  . Tobacco comment: smoked 1958-1986, up to 1 ppd  Substance Use Topics  . Alcohol use: Yes    Comment: socially on occasion  . Drug use: No     Family Hx: The patient's family history includes Colon cancer (age of onset: 23) in his father; Coronary artery disease in his mother; Diabetes in his maternal grandfather and sister; Heart attack (age of onset: 82) in his sister; Heart attack (age of onset: 33) in his maternal grandfather; Hypertension in his father; Pancreatitis in his father. There is no history of Thyroid disease, Stroke, or Prostate cancer.  ROS:   Please see the history of present illness.     All other systems reviewed and are negative.   Prior CV studies:   The following studies were reviewed today:  Venous Duplex 02/07/19 TTE 10/13/18   Labs/Other Tests and Data Reviewed:    EKG:  Not performed during tele visit   Recent Labs: 02/07/2019: ALT 19; Hemoglobin 14.9; Platelets 213.0; Pro B Natriuretic peptide (BNP) 333.0 04/27/2019: BUN 22; Creatinine, Ser 1.13; Potassium 4.1; Sodium 141 05/10/2019: TSH 0.68   Recent Lipid Panel Lab Results  Component Value Date/Time   CHOL 126 05/10/2019 11:16 AM   CHOL 161 10/31/2014 08:24 AM   TRIG 114.0 05/10/2019 11:16 AM   TRIG 81 10/31/2014 08:24 AM   TRIG 86  07/23/2006 10:12 AM   HDL 41.10 05/10/2019 11:16 AM   HDL 49 10/31/2014 08:24 AM   CHOLHDL 3 05/10/2019 11:16 AM   LDLCALC 62 05/10/2019 11:16 AM   LDLCALC 96 10/31/2014 08:24 AM    Wt Readings from Last 3 Encounters:  06/09/19 189 lb (85.7 kg)  05/19/19 183 lb (83 kg)  05/10/19 186 lb 4 oz (84.5 kg)     Objective:    Vital Signs:  BP (!) 142/80   Pulse 69   Ht 5\' 9"  (1.753 m)   Wt 189 lb (85.7 kg)  SpO2 97%   BMI 27.91 kg/m    Affect appropriate Healthy:  appears stated age 31: normal Neck supple with no adenopathy JVP normal no bruits no thyromegaly Lungs clear with no wheezing and good diaphragmatic motion Heart:  S1/S2 no murmur, no rub, gallop or click PMI normal Abdomen: benighn, BS positve, no tenderness, no AAA no bruit.  No HSM or HJR Distal pulses intact with no bruits Plus 1 RLE edema plus 1 LLE with varicosities Neuro non-focal Skin warm and dry No muscular weakness    ASSESSMENT & PLAN:    LE edema: venous reflux f/u Dr Donzetta Matters better with higher dose lasix   CAD: DES to circumflex 2006  stable with low risk Myoview 03/31/16 . Continue ASA, statin and BB  Carotid artery disease: stable doppler study in 01/10/18  Plaque no significant stenosis . Repeat due in 2 years ( 12/2019 )  HLD: continue statin. Followed by Dr. Larose Kells  Hypothyroidism: continue synthroid followed by Dr. Irma Newness:  Post right TKR and lumbar surgery    Afib:  New diagnosis  09/28/18 On Eliquis 5 bid for CHADVASC 4.  Continue cardizem for rate control Given asymptomatic state normal EF by TTE 10/13/18 And severe LAE will adopt strategy of rate control and anticoagulation and not aggressive [facility]  COVID-19 Education: The signs and symptoms of COVID-19 were discussed with the patient and how to seek care for testing (follow up with PCP or arrange E-visit).  The importance of social distancing was discussed today.  Time:   Today, I have spent 30 minutes with the patient      Medication Adjustments/Labs and Tests Ordered: Current medicines are reviewed at length with the patient today.  Concerns regarding medicines are outlined above.   Tests Ordered:  None  Medication Changes:  None   Disposition:  Follow up in 6 months  Signed, Jenkins Rouge, MD  06/09/2019 10:07 AM    [place]

## 2019-06-02 ENCOUNTER — Telehealth: Payer: Self-pay | Admitting: Cardiovascular Disease

## 2019-06-02 NOTE — Telephone Encounter (Signed)
The patient called to inform us that his Eliquis cost has increased from $75 to $125 and would like to discuss with Dr Johnsie Cancel at the [facility] on 9/18.

## 2019-06-02 NOTE — Telephone Encounter (Signed)
New Message   Pt c/o medication issue:  1. Name of Medication: apixaban (ELIQUIS) 5 MG TABS tablet    2. How are you currently taking this medication (dosage and times per day)? Take 1 tablet (5 mg total) by mouth 2 (two) times daily.  3. Are you having a reaction (difficulty breathing--STAT)?  4. What is your medication issue? Patient is calling because the cost of the medication has increased.

## 2019-06-03 ENCOUNTER — Telehealth: Payer: Self-pay | Admitting: Internal Medicine

## 2019-06-05 NOTE — Telephone Encounter (Signed)
Correct dose is levothyroxine 25 mcg, prescription sent

## 2019-06-05 NOTE — Telephone Encounter (Signed)
Please advise dosage on Synthroid please.

## 2019-06-09 ENCOUNTER — Ambulatory Visit (INDEPENDENT_AMBULATORY_CARE_PROVIDER_SITE_OTHER): Payer: Medicare Other | Admitting: Cardiovascular Disease

## 2019-06-09 ENCOUNTER — Encounter: Payer: Self-pay | Admitting: Cardiovascular Disease

## 2019-06-09 ENCOUNTER — Other Ambulatory Visit: Payer: Self-pay

## 2019-06-09 VITALS — BP 142/80 | HR 69 | Ht 69.0 in | Wt 189.0 lb

## 2019-06-09 DIAGNOSIS — I2510 Atherosclerotic heart disease of native coronary artery without angina pectoris: Secondary | ICD-10-CM | POA: Diagnosis not present

## 2019-06-09 NOTE — Patient Instructions (Signed)

## 2019-06-27 ENCOUNTER — Other Ambulatory Visit: Payer: Self-pay | Admitting: Internal Medicine

## 2019-06-27 MED ORDER — LEVOTHYROXINE SODIUM 25 MCG PO TABS: 25.0000 ug | ORAL_TABLET | Freq: Every day | ORAL | 1 refills | Status: DC

## 2019-07-14 DIAGNOSIS — I872 Venous insufficiency (chronic) (peripheral): Secondary | ICD-10-CM | POA: Diagnosis not present

## 2019-07-14 DIAGNOSIS — Z85828 Personal history of other malignant neoplasm of skin: Secondary | ICD-10-CM | POA: Diagnosis not present

## 2019-07-14 DIAGNOSIS — Z08 Encounter for follow-up examination after completed treatment for malignant neoplasm: Secondary | ICD-10-CM | POA: Diagnosis not present

## 2019-07-20 ENCOUNTER — Other Ambulatory Visit: Payer: Self-pay | Admitting: Internal Medicine

## 2019-07-23 ENCOUNTER — Other Ambulatory Visit: Payer: Self-pay | Admitting: Internal Medicine

## 2019-07-25 ENCOUNTER — Telehealth: Payer: Self-pay

## 2019-07-25 MED ORDER — LEVOTHYROXINE SODIUM 150 MCG PO TABS: 150.0000 ug | ORAL_TABLET | Freq: Every day | ORAL | 0 refills | Status: DC

## 2019-07-25 MED ORDER — LEVOTHYROXINE SODIUM 25 MCG PO TABS: 25.0000 ug | ORAL_TABLET | Freq: Every day | ORAL | 0 refills | Status: DC

## 2019-07-25 NOTE — Telephone Encounter (Signed)
Dr. Larose Kells- can you advise on correct Synthroid dosage please.

## 2019-07-25 NOTE — Telephone Encounter (Signed)
Patient is to be on 2 levothyroxine tablets: 150 mcg AND  25 mcg =  175 mcg a day  Send refills, please explain above to the patient, TSH when he comes back 09-2019

## 2019-07-25 NOTE — Telephone Encounter (Signed)
Copied from [place] [id number]. Topic: General - Inquiry >> Jul 25, 2019  3:34 PM [name] wrote: Reason for CRM: pt called in and stated that he was not sure the mg on his thyroid med.  He stated that pharmacy has 250 tabs.  He would like to know what mg he is suppose to be taking  Best number  [phone]

## 2019-07-25 NOTE — Telephone Encounter (Signed)
Spoke w/ Pt- informed of recommendations. Pt verbalized understanding. He currently has the Synthroid 73mcg at home- he will go to [facility] and pick up the 147mcg tablets.

## 2019-08-03 ENCOUNTER — Other Ambulatory Visit: Payer: Self-pay

## 2019-08-04 ENCOUNTER — Ambulatory Visit (INDEPENDENT_AMBULATORY_CARE_PROVIDER_SITE_OTHER): Payer: Medicare Other | Admitting: Internal Medicine

## 2019-08-04 VITALS — BP 138/76 | HR 67 | Temp 97.0°F | Resp 12 | Ht 69.0 in | Wt 185.0 lb

## 2019-08-04 DIAGNOSIS — L03115 Cellulitis of right lower limb: Secondary | ICD-10-CM

## 2019-08-04 DIAGNOSIS — Z23 Encounter for immunization: Secondary | ICD-10-CM | POA: Diagnosis not present

## 2019-08-04 MED ORDER — DOXYCYCLINE HYCLATE 100 MG PO TABS: 100.0000 mg | ORAL_TABLET | Freq: Two times a day (BID) | ORAL | 0 refills | Status: DC

## 2019-08-04 NOTE — Patient Instructions (Addendum)
  GO TO THE FRONT DESK Schedule your next appointment   for a checkup 10 days from today  Please keep your right leg elevated even more than before. At least 2 hours in the morning and 2 hours in the afternoon resting in bed with leg propped up.  Take antibiotic for 10 days  Call or go to the ER if the redness, swelling is getting worse or if you have fever or chills.

## 2019-08-04 NOTE — Progress Notes (Signed)
Subjective:    Patient ID: [name], male    DOB: 1935/12/14, 83 y.o.   MRN: [id number]  DOS:  08/04/2019 Type of visit - description: Acute visit Reports increased swelling, redness and warmness of the right leg.  He is not completely sure for how long this is going on, possibly 3 to 4 weeks. Has noted an area of oozing clear fluids. I noted the calf to be more swelling but he denies tenderness.  Chart reviewed Lower extremity edema, saw vascular and vein specialists 04-2019, at the time they felt problem was manageable with compression stockings,  saphenous veins did not required a intervention due to size   Saw cardiology 05/2019, felt to be stable  Wt Readings from Last 3 Encounters:  08/04/19 185 lb (83.9 kg)  06/09/19 189 lb (85.7 kg)  05/19/19 183 lb (83 kg)      Review of Systems No fever chills No nausea, vomiting, diarrhea  Past Medical History:  Diagnosis Date  . Arthritis   . BPH (benign prostatic hypertrophy)   . Carotid artery disease ([place])    Doppler, December 08, 2011, 00 [id number] R. ICA, 123456 LICA, followup 1 year  . Coronary artery disease    DES Circumflex or CVA 2006  /  clear, February, 2011, EF 70%, no ischemia or  . Ejection fraction    EF 60%, echo, 2009, mildly calcified aortic leaflets  . History of colonic polyps   . Hyperlipidemia   . Hypertension   . Hypothyroidism   . Lumbar radiculopathy   . Multiple thyroid nodules    Avascular echogenic areas noted in the right thyroid at the time of carotid Doppler.   Marland Kitchen PONV (postoperative nausea and vomiting)    "nausea with first hip surgery"  . Skin cancer (melanoma) (HCC)    PMH of   . Spinal stenosis, lumbar   . Tremor    Fine tremor right upper extremity  . Venous insufficiency     Past Surgical History:  Procedure Laterality Date  . COLONOSCOPY    . CORONARY ANGIOPLASTY WITH STENT PLACEMENT  2006   x 2 stents; DES to CX and dRCA '06  . KNEE SURGERY  1970's   "chipped bone"  . LUMBAR  LAMINECTOMY/DECOMPRESSION MICRODISCECTOMY Left 07/13/2016   Procedure: LUMBAR LAMINECTOMY AND FORAMINOTOMY Lumbar two, three, Lumbar three-four, Lumbar four-five, LEFT Lumbar five-Sacral one DISECTOMY;  Surgeon: Newman Pies, MD;  Location: [place];  Service: Neurosurgery;  Laterality: Left;  . TOE SURGERY  1997  . TONSILLECTOMY    . TOTAL HIP ARTHROPLASTY Left 2007  . TOTAL HIP ARTHROPLASTY Right 2010  . TOTAL KNEE ARTHROPLASTY Right 06/25/2014   Procedure: RIGHT TOTAL KNEE ARTHROPLASTY;  Surgeon: Gearlean Alf, MD;  Location: [facility];  Service: Orthopedics;  Laterality: Right;    Social History   Socioeconomic History  . Marital status: Married    Spouse name: Not on file  . Number of children: 2  . Years of education: Not on file  . Highest education level: Not on file  Occupational History  . Occupation: retired, Nurse, learning disability  Social Needs  . Financial resource strain: Not on file  . Food insecurity    Worry: Not on file    Inability: Not on file  . Transportation needs    Medical: Not on file    Non-medical: Not on file  Tobacco Use  . Smoking status: Former Smoker    Types: Cigarettes  Quit date: 09/21/1984    Years since quitting: 34.8  . Smokeless tobacco: Never Used  . Tobacco comment: smoked 1958-1986, up to 1 ppd  Substance and Sexual Activity  . Alcohol use: Yes    Comment: socially on occasion  . Drug use: No  . Sexual activity: Not on file  Lifestyle  . Physical activity    Days per week: Not on file    Minutes per session: Not on file  . Stress: Not on file  Relationships  . Social Herbalist on phone: Not on file    Gets together: Not on file    Attends religious service: Not on file    Active member of club or organization: Not on file    Attends meetings of clubs or organizations: Not on file    Relationship status: Not on file  . Intimate partner violence    Fear of current or ex partner: Not on file     Emotionally abused: Not on file    Physically abused: Not on file    Forced sexual activity: Not on file  Other Topics Concern  . Not on file  Social History Narrative   2 daughters, one is a Therapist, sports @ [employer]      Allergies as of 08/04/2019      Reactions   Amlodipine Besylate Other (See Comments)   REACTION: edema      Medication List       Accurate as of August 04, 2019 10:57 AM. If you have any questions, ask your nurse or doctor.        apixaban 5 MG Tabs tablet Commonly known as: Eliquis Take 1 tablet (5 mg total) by mouth 2 (two) times daily.   aspirin 81 MG tablet Take 1 tablet (81 mg total) by mouth daily.   atorvastatin 40 MG tablet Commonly known as: LIPITOR Take 0.5 tablets (20 mg total) by mouth at bedtime.   benazepril 20 MG tablet Commonly known as: LOTENSIN Take 1.5 tablets (30 mg total) by mouth daily.   clobetasol cream 0.05 % Commonly known as: TEMOVATE Apply 1 application topically 2 (two) times daily as needed.   diltiazem 180 MG 24 hr capsule Commonly known as: Dilt-XR Take 1 capsule (180 mg total) by mouth daily.   furosemide 40 MG tablet Commonly known as: LASIX Take 1.5 tablets (60 mg total) by mouth 2 (two) times daily.   hydrocortisone 2.5 % cream Apply topically 2 (two) times daily as needed.   levothyroxine 25 MCG tablet Commonly known as: SYNTHROID Take 1 tablet (25 mcg total) by mouth daily before breakfast. (Along w/ a 19mcg tablet daily)   levothyroxine 150 MCG tablet Commonly known as: SYNTHROID Take 1 tablet (150 mcg total) by mouth daily before breakfast. (Along w/ a 72mcg tablet daily)   Lumigan 0.01 % Soln Generic drug: bimatoprost Place 1 drop into both eyes 2 (two) times daily.   metoprolol tartrate 25 MG tablet Commonly known as: LOPRESSOR Take 0.5 tablets (12.5 mg total) by mouth 2 (two) times daily. (BETA BLOCKER)   nitroGLYCERIN 0.4 MG SL tablet Commonly known as: NITROSTAT PLACE 1 TABLET UNDER THE TONGUE  EVERY 5 MINUTES AS NEEDED FOR CHEST PAIN   Timolol Maleate 0.5 % (DAILY) Soln Apply 1 drop to eye daily.           Objective:   Physical Exam Skin:        BP 138/76 (BP Location: Left Arm, Cuff Size:  Normal)   Pulse 67   Temp (!) 97 F (36.1 C) (Temporal)   Resp 12   Ht 5\' 9"  (1.753 m)   Wt 185 lb (83.9 kg)   SpO2 99%   BMI 27.32 kg/m  General:   Well developed, NAD, BMI noted. HEENT:  Normocephalic . Face symmetric, atraumatic Lower extremities: Left leg  at baseline Right leg: See picture and graphic, calf if 2 inches larger in diameter, not tender to palpation.  Pretibial skin is slightly red, warm, minimal TTP.  He does indeed have an area that is oozing clear fluid. Neurologic:  alert & oriented X3.  Speech normal, gait appropriate for age and unassisted Psych--  Cognition and judgment appear intact.  Cooperative with normal attention span and concentration.  Behavior appropriate. No anxious or depressed appearing.    Area of oozing:       Assessment     Assessment  (Transfer from  Dr. Linna Darner [phone]) Diabetes, A1c 6.2 2013 HTN Hyperlipidemia Hypothyroidism  Thyroid US 2014-- no nodules, inhomogeneous  CV: --CAD: stents 2006;  low risk nuclear scan 2011, 03-2016 --A Fib dx 09/2018 at regular crads OV, on Eliquis, EF --Carotid artery disease [place] 12/2014:   Stable, 1-39% RICA stenosis. Stable, 123456 LICA stenosis. [place] 12/2015: Next 2 years R leg edema, [place] (-)   DVT 08-2014 and 01/2019 [facility] DJD: Dr Maureen Ralphs, back surgery Dr Arnoldo Morale 06-2016 Venous insufficiency, chronic R>L edema Tremor Glaucoma H/o skin cancer , denies Melanoma; sees derm, had a visit ~ 09/2017, Dr Nevada Crane  PLAN: Right lower extremity cellulitis: In the context of chronic edema. Today the right calf is 2 inches larger compared to the left, similar swelling was noted 01/2019, see office visit, at that time ultrasound was negative for DVT. He is anticoagulated with good compliance.  Plan:  Recommend to elevate his legs even more than usual, doxycycline for 10 days, call if not improving, RTC 10 days for checkup. Patient has multiple questions about why this is happening, I explained that edema is chronic and b/c the skin is thin he might develop recurrent cellulitis, key is to keep edema controlled

## 2019-08-05 NOTE — Assessment & Plan Note (Signed)
Right lower extremity cellulitis: In the context of chronic edema. Today the right calf is 2 inches larger compared to the left, similar swelling was noted 01/2019, see office visit, at that time ultrasound was negative for DVT. [name] is anticoagulated with good compliance. Plan:  Recommend to elevate his legs even more than usual, doxycycline for 10 days, call if not improving, RTC 10 days for checkup. Patient has multiple questions about why this is happening, I explained that edema is chronic and b/c the skin is thin [name] might develop recurrent cellulitis, key is to keep edema controlled

## 2019-08-11 ENCOUNTER — Other Ambulatory Visit: Payer: Self-pay

## 2019-08-14 ENCOUNTER — Ambulatory Visit (INDEPENDENT_AMBULATORY_CARE_PROVIDER_SITE_OTHER): Payer: Medicare Other | Admitting: Internal Medicine

## 2019-08-14 ENCOUNTER — Other Ambulatory Visit: Payer: Self-pay

## 2019-08-14 ENCOUNTER — Encounter: Payer: Self-pay | Admitting: Internal Medicine

## 2019-08-14 VITALS — BP 116/61 | HR 48 | Temp 96.8°F | Resp 18 | Ht 69.0 in | Wt 184.2 lb

## 2019-08-14 DIAGNOSIS — L03115 Cellulitis of right lower limb: Secondary | ICD-10-CM | POA: Diagnosis not present

## 2019-08-14 MED ORDER — CEPHALEXIN 500 MG PO CAPS: 500.0000 mg | ORAL_CAPSULE | Freq: Four times a day (QID) | ORAL | 0 refills | Status: DC

## 2019-08-14 NOTE — Patient Instructions (Addendum)
Please schedule Medicare Wellness with Glenard Haring.   Take the new antibiotic cephalexin 4 times a day for 5 days  Leg elevation is very important  Call in 2 weeks if your leg is not back to normal  Call anytime if you have fever or chills or if you get worse

## 2019-08-14 NOTE — Progress Notes (Signed)
Pre visit review using our clinic review tool, if applicable. No additional management support is needed unless otherwise documented below in the visit note. 

## 2019-08-14 NOTE — Progress Notes (Signed)
Subjective:    Patient ID: [name], male    DOB: 07-26-1936, 83 y.o.   MRN: [id number]  DOS:  08/14/2019 Type of visit - description: Follow-up, see previous visit Since the last visit, he is doing well.  Took doxycycline without apparent problems Redness/warmness and swelling of the right leg is decreased.   Review of Systems Denies fever chills No nausea, vomiting, diarrhea  Past Medical History:  Diagnosis Date  . Arthritis   . BPH (benign prostatic hypertrophy)   . Carotid artery disease ([place])    Doppler, December 08, 2011, 00 [id number] R. ICA, 123456 LICA, followup 1 year  . Coronary artery disease    DES Circumflex or CVA 2006  /  clear, February, 2011, EF 70%, no ischemia or  . Ejection fraction    EF 60%, echo, 2009, mildly calcified aortic leaflets  . History of colonic polyps   . Hyperlipidemia   . Hypertension   . Hypothyroidism   . Lumbar radiculopathy   . Multiple thyroid nodules    Avascular echogenic areas noted in the right thyroid at the time of carotid Doppler.   Marland Kitchen PONV (postoperative nausea and vomiting)    "nausea with first hip surgery"  . Skin cancer (melanoma) (HCC)    PMH of   . Spinal stenosis, lumbar   . Tremor    Fine tremor right upper extremity  . Venous insufficiency     Past Surgical History:  Procedure Laterality Date  . COLONOSCOPY    . CORONARY ANGIOPLASTY WITH STENT PLACEMENT  2006   x 2 stents; DES to CX and dRCA '06  . KNEE SURGERY  1970's   "chipped bone"  . LUMBAR LAMINECTOMY/DECOMPRESSION MICRODISCECTOMY Left 07/13/2016   Procedure: LUMBAR LAMINECTOMY AND FORAMINOTOMY Lumbar two, three, Lumbar three-four, Lumbar four-five, LEFT Lumbar five-Sacral one DISECTOMY;  Surgeon: Newman Pies, MD;  Location: [place];  Service: Neurosurgery;  Laterality: Left;  . TOE SURGERY  1997  . TONSILLECTOMY    . TOTAL HIP ARTHROPLASTY Left 2007  . TOTAL HIP ARTHROPLASTY Right 2010  . TOTAL KNEE ARTHROPLASTY Right 06/25/2014   Procedure: RIGHT  TOTAL KNEE ARTHROPLASTY;  Surgeon: Gearlean Alf, MD;  Location: [facility];  Service: Orthopedics;  Laterality: Right;    Social History   Socioeconomic History  . Marital status: Married    Spouse name: Not on file  . Number of children: 2  . Years of education: Not on file  . Highest education level: Not on file  Occupational History  . Occupation: retired, Nurse, learning disability  Social Needs  . Financial resource strain: Not on file  . Food insecurity    Worry: Not on file    Inability: Not on file  . Transportation needs    Medical: Not on file    Non-medical: Not on file  Tobacco Use  . Smoking status: Former Smoker    Types: Cigarettes    Quit date: 09/21/1984    Years since quitting: 34.9  . Smokeless tobacco: Never Used  . Tobacco comment: smoked 1958-1986, up to 1 ppd  Substance and Sexual Activity  . Alcohol use: Yes    Comment: socially on occasion  . Drug use: No  . Sexual activity: Not on file  Lifestyle  . Physical activity    Days per week: Not on file    Minutes per session: Not on file  . Stress: Not on file  Relationships  . Social connections  Talks on phone: Not on file    Gets together: Not on file    Attends religious service: Not on file    Active member of club or organization: Not on file    Attends meetings of clubs or organizations: Not on file    Relationship status: Not on file  . Intimate partner violence    Fear of current or ex partner: Not on file    Emotionally abused: Not on file    Physically abused: Not on file    Forced sexual activity: Not on file  Other Topics Concern  . Not on file  Social History Narrative   2 daughters, one is a Therapist, sports @ [employer]      Allergies as of 08/14/2019      Reactions   Amlodipine Besylate Other (See Comments)   REACTION: edema      Medication List       Accurate as of August 14, 2019 10:58 AM. If you have any questions, ask your nurse or doctor.        apixaban 5 MG  Tabs tablet Commonly known as: Eliquis Take 1 tablet (5 mg total) by mouth 2 (two) times daily.   aspirin 81 MG tablet Take 1 tablet (81 mg total) by mouth daily.   atorvastatin 40 MG tablet Commonly known as: LIPITOR Take 0.5 tablets (20 mg total) by mouth at bedtime.   benazepril 20 MG tablet Commonly known as: LOTENSIN Take 1.5 tablets (30 mg total) by mouth daily.   clobetasol cream 0.05 % Commonly known as: TEMOVATE Apply 1 application topically 2 (two) times daily as needed.   diltiazem 180 MG 24 hr capsule Commonly known as: Dilt-XR Take 1 capsule (180 mg total) by mouth daily.   doxycycline 100 MG tablet Commonly known as: VIBRA-TABS Take 1 tablet (100 mg total) by mouth 2 (two) times daily.   furosemide 40 MG tablet Commonly known as: LASIX Take 1.5 tablets (60 mg total) by mouth 2 (two) times daily.   hydrocortisone 2.5 % cream Apply topically 2 (two) times daily as needed.   levothyroxine 25 MCG tablet Commonly known as: SYNTHROID Take 1 tablet (25 mcg total) by mouth daily before breakfast. (Along w/ a 177mcg tablet daily)   levothyroxine 150 MCG tablet Commonly known as: SYNTHROID Take 1 tablet (150 mcg total) by mouth daily before breakfast. (Along w/ a 28mcg tablet daily)   Lumigan 0.01 % Soln Generic drug: bimatoprost Place 1 drop into both eyes 2 (two) times daily.   metoprolol tartrate 25 MG tablet Commonly known as: LOPRESSOR Take 0.5 tablets (12.5 mg total) by mouth 2 (two) times daily. (BETA BLOCKER)   nitroGLYCERIN 0.4 MG SL tablet Commonly known as: NITROSTAT PLACE 1 TABLET UNDER THE TONGUE EVERY 5 MINUTES AS NEEDED FOR CHEST PAIN   Timolol Maleate 0.5 % (DAILY) Soln Apply 1 drop to eye daily.           Objective:   Physical Exam BP 116/61 (BP Location: Left Arm, Patient Position: Sitting, Cuff Size: Normal)   Pulse (!) 48   Temp (!) 96.8 F (36 C) (Temporal)   Resp 18   Ht 5\' 9"  (1.753 m)   Wt 184 lb 4 oz (83.6 kg)   SpO2  98%   BMI 27.21 kg/m  General:   Well developed, NAD, BMI noted. HEENT:  Normocephalic . Face symmetric, atraumatic Lower extremities: Left: Unchanged from previous visit Right: Calf is measured, circumference have decreased, it is now  only 1-3/4 inches larger compared to the other side. The area of redness and warmness has decreased in size but is not completely gone The area where he was oozing clear fluid is closed. Inspection of the skin between the toes showed no openings. Neurologic:  alert & oriented X3.  Speech normal, gait appropriate for age and unassisted Psych--  Cognition and judgment appear intact.  Cooperative with normal attention span and concentration.  Behavior appropriate. No anxious or depressed appearing.      Assessment      Assessment  (Transfer from  Dr. Linna Darner [phone]) Diabetes, A1c 6.2 2013 HTN Hyperlipidemia Hypothyroidism  Thyroid US 2014-- no nodules, inhomogeneous  CV: --CAD: stents 2006;  low risk nuclear scan 2011, 03-2016 --A Fib dx 09/2018 at regular crads OV, on Eliquis, EF --Carotid artery disease [place] 12/2014:   Stable, 1-39% RICA stenosis. Stable, 123456 LICA stenosis. [place] 12/2015: Next 2 years R leg edema, [place] (-)   DVT 08-2014 and 01/2019 [facility] DJD: Dr Maureen Ralphs, back surgery Dr Arnoldo Morale 06-2016 Venous insufficiency, chronic R>L edema Tremor Glaucoma H/o skin cancer , denies Melanoma; sees derm, had a visit ~ 09/2017, Dr Nevada Crane  PLAN: Right lower extremity cellulitis: Mild improvement, but still swelling and still has some redness. Advised patient the key is to keep the leg elevated to decrease edema Rx 5 more days of antibiotics, Keflex. He will call me in 2 weeks if not gradually better & back to his baseline.  If that is not the case we will pursue ultrasound although a DVT is unlikely. He verbalized understanding Next OV 09-2019 already schedule.   This visit occurred during the SARS-CoV-2 public health emergency.  Safety protocols were  in place, including screening questions prior to the visit, additional usage of staff PPE, and extensive cleaning of exam room while observing appropriate contact time as indicated for disinfecting solutions.

## 2019-08-16 NOTE — Assessment & Plan Note (Addendum)
Right lower extremity cellulitis: Mild improvement, but still swelling and still has some redness. Advised patient the key is to keep the leg elevated to decrease edema Rx 5 more days of antibiotics, Keflex. He will call me in 2 weeks if not gradually better & back to his baseline.  If that is not the case we will pursue ultrasound although a DVT is unlikely. He verbalized understanding Next OV 09-2019 already schedule

## 2019-08-23 DIAGNOSIS — H43813 Vitreous degeneration, bilateral: Secondary | ICD-10-CM | POA: Diagnosis not present

## 2019-08-23 DIAGNOSIS — H25013 Cortical age-related cataract, bilateral: Secondary | ICD-10-CM | POA: Diagnosis not present

## 2019-08-23 DIAGNOSIS — H524 Presbyopia: Secondary | ICD-10-CM | POA: Diagnosis not present

## 2019-08-23 DIAGNOSIS — H401134 Primary open-angle glaucoma, bilateral, indeterminate stage: Secondary | ICD-10-CM | POA: Diagnosis not present

## 2019-09-05 ENCOUNTER — Other Ambulatory Visit: Payer: Self-pay | Admitting: Internal Medicine

## 2019-09-24 ENCOUNTER — Other Ambulatory Visit: Payer: Self-pay | Admitting: Internal Medicine

## 2019-10-11 ENCOUNTER — Ambulatory Visit (INDEPENDENT_AMBULATORY_CARE_PROVIDER_SITE_OTHER): Payer: Medicare Other | Admitting: Internal Medicine

## 2019-10-11 ENCOUNTER — Other Ambulatory Visit: Payer: Self-pay

## 2019-10-11 ENCOUNTER — Encounter: Payer: Self-pay | Admitting: Internal Medicine

## 2019-10-11 VITALS — BP 141/81 | HR 55 | Temp 95.7°F | Resp 16 | Ht 69.0 in | Wt 190.5 lb

## 2019-10-11 DIAGNOSIS — Z0000 Encounter for general adult medical examination without abnormal findings: Secondary | ICD-10-CM

## 2019-10-11 DIAGNOSIS — M1991 Primary osteoarthritis, unspecified site: Secondary | ICD-10-CM

## 2019-10-11 DIAGNOSIS — E782 Mixed hyperlipidemia: Secondary | ICD-10-CM | POA: Diagnosis not present

## 2019-10-11 DIAGNOSIS — R739 Hyperglycemia, unspecified: Secondary | ICD-10-CM

## 2019-10-11 DIAGNOSIS — M7989 Other specified soft tissue disorders: Secondary | ICD-10-CM

## 2019-10-11 DIAGNOSIS — E039 Hypothyroidism, unspecified: Secondary | ICD-10-CM | POA: Diagnosis not present

## 2019-10-11 DIAGNOSIS — I779 Disorder of arteries and arterioles, unspecified: Secondary | ICD-10-CM

## 2019-10-11 DIAGNOSIS — I10 Essential (primary) hypertension: Secondary | ICD-10-CM

## 2019-10-11 LAB — BASIC METABOLIC PANEL
BUN: 23 mg/dL (ref 6–23)
CO2: 28 mEq/L (ref 19–32)
Calcium: 9.5 mg/dL (ref 8.4–10.5)
Chloride: 105 mEq/L (ref 96–112)
Creatinine, Ser: 1.16 mg/dL (ref 0.40–1.50)
GFR: 60.34 mL/min (ref >60.00)
Glucose, Bld: 86 mg/dL (ref 70–99)
Potassium: 4.2 mEq/L (ref 3.5–5.1)
Sodium: 141 mEq/L (ref 135–145)

## 2019-10-11 LAB — TSH: TSH: 0.93 u[IU]/mL (ref 0.35–4.50)

## 2019-10-11 LAB — HEMOGLOBIN A1C: Hgb A1c MFr Bld: 6.2 % (ref 4.6–6.5)

## 2019-10-11 NOTE — Patient Instructions (Addendum)
GO TO THE LAB : Get the blood work     GO TO THE FRONT DESK Schedule your next appointment   for a checkup in 6 months  Try to keep your leg elevated an hour twice a day  Consider using compression stocking   Check the  blood pressure weekly BP GOAL is between 110/65 and  135/85. If it is consistently higher or lower, let me know    Use wide, comfortable tennis shoes to prevent toe pain.

## 2019-10-11 NOTE — Progress Notes (Signed)
Pre visit review using our clinic review tool, if applicable. No additional management support is needed unless otherwise documented below in the visit note. 

## 2019-10-11 NOTE — Progress Notes (Signed)
Subjective:    Patient ID: [name], male    DOB: August 14, 1936, 84 y.o.   MRN: [id number]  DOS:  10/11/2019 Type of visit - description: CPX Since the last office visit he is doing well. Lower extremity edema is at baseline. He reports pain on the base of the R great toe.  No redness, swelling.  Pain is elicited by walking or moving the toe.  BP Readings from Last 3 Encounters:  10/11/19 (!) 141/81  08/14/19 116/61  08/04/19 138/76     Review of Systems  Other than above, a 14 point review of systems is negative    Past Medical History:  Diagnosis Date  . Arthritis   . BPH (benign prostatic hypertrophy)   . Carotid artery disease ([place])    Doppler, December 08, 2011, 00 [id number] R. ICA, 123456 LICA, followup 1 year  . Coronary artery disease    DES Circumflex or CVA 2006  /  clear, February, 2011, EF 70%, no ischemia or  . Ejection fraction    EF 60%, echo, 2009, mildly calcified aortic leaflets  . History of colonic polyps   . Hyperlipidemia   . Hypertension   . Hypothyroidism   . Lumbar radiculopathy   . Multiple thyroid nodules    Avascular echogenic areas noted in the right thyroid at the time of carotid Doppler.   Marland Kitchen PONV (postoperative nausea and vomiting)    "nausea with first hip surgery"  . Skin cancer (melanoma) ([facility])    PMH of   . Spinal stenosis, lumbar   . Tremor    Fine tremor right upper extremity  . Venous insufficiency     Past Surgical History:  Procedure Laterality Date  . COLONOSCOPY    . CORONARY ANGIOPLASTY WITH STENT PLACEMENT  2006   x 2 stents; DES to CX and dRCA '06  . KNEE SURGERY  1970's   "chipped bone"  . LUMBAR LAMINECTOMY/DECOMPRESSION MICRODISCECTOMY Left 07/13/2016   Procedure: LUMBAR LAMINECTOMY AND FORAMINOTOMY Lumbar two, three, Lumbar three-four, Lumbar four-five, LEFT Lumbar five-Sacral one DISECTOMY;  Surgeon: Newman Pies, MD;  Location: [place];  Service: Neurosurgery;  Laterality: Left;  . TOE SURGERY  1997  .  TONSILLECTOMY    . TOTAL HIP ARTHROPLASTY Left 2007  . TOTAL HIP ARTHROPLASTY Right 2010  . TOTAL KNEE ARTHROPLASTY Right 06/25/2014   Procedure: RIGHT TOTAL KNEE ARTHROPLASTY;  Surgeon: Gearlean Alf, MD;  Location[facility];  Service: Orthopedics;  Laterality: Right;   Family History  Problem Relation Age of Onset  . Coronary artery disease Mother        carotid endarterectomy bilaterally  . Colon cancer Father 6  . Hypertension Father   . Pancreatitis Father   . Diabetes Sister   . Diabetes Maternal Grandfather   . Heart attack Maternal Grandfather 70  . Heart attack Sister 51  . Thyroid disease Neg Hx   . Stroke Neg Hx   . Prostate cancer Neg Hx          Objective:   Physical Exam BP (!) 141/81 (BP Location: Left Arm, Patient Position: Sitting, Cuff Size: Small)   Pulse (!) 55   Temp (!) 95.7 F (35.4 C) (Temporal)   Resp 16   Ht 5\' 9"  (1.753 m)   Wt 190 lb 8 oz (86.4 kg)   SpO2 96%   BMI 28.13 kg/m  General: Well developed, NAD, BMI noted Neck: No  thyromegaly  HEENT:  Normocephalic . Face symmetric,  atraumatic Lungs:  CTA B Normal respiratory effort, no intercostal retractions, no accessory muscle use. Heart: irreg  no murmur.  No pitting edema, R calf interested larger in diameter compared to the left Abdomen:  Not distended, soft, non-tender. No rebound or rigidity.   Skin: Exposed areas without rash. Not pale. Not jaundice Neurologic:  alert & oriented X3.  Speech normal, gait assisted by a cane, able to transfer by himself. Strength symmetric and appropriate for age.  Psych: Cognition and judgment appear intact.  Cooperative with normal attention span and concentration.  Behavior appropriate. No anxious or depressed appearing.     Assessment      Assessment  (Transfer from  Dr. Linna Darner [phone]) Hyperglycemia, A1c 6.2 2013 HTN Hyperlipidemia Hypothyroidism  Thyroid US 2014-- no nodules, inhomogeneous  CV: --CAD: stents 2006;  low risk  nuclear scan 2011, 03-2016 --A Fib dx 09/2018 at regular crads OV, on Eliquis, EF --Carotid artery disease [place] 12/2014:   Stable, 1-39% RICA stenosis. Stable, 123456 LICA stenosis. [place] 12/2015: Next 2 years R leg edema, [place] (-)   DVT 08-2014 and 01/2019 [facility] DJD: Dr Maureen Ralphs, back surgery Dr Arnoldo Morale 06-2016 Venous insufficiency, chronic R>L edema Tremor Glaucoma H/o skin cancer , denies Melanoma; sees derm, had a visit ~ 09/2017, Dr Nevada Crane  PLAN: Here for CPX Hyperglycemia: Check A1c HTN: Continue with benazepril, diltiazem, Lasix, metoprolol.  Check a BMP.  Recommend to check BPs at home. Hyperlipidemia: On Lipitor, controlled Hypothyroidism: On Synthroid 175 mcg, check a TSH CAD, atrial fibrillation, carotid artery disease: Last visit with cardiology 05-2019, felt to be stable, next carotid ultrasound 12-2019. BNP was check 01/2019, around 300, Lasix was increased temporarily. Chronic right leg swelling: Right calf is symmetric when he wakes up, as the day goes by it increases in size. Rec leg elevation , compression stocking if so desired. DJD: Complains of right great toe pain, denies swelling/redness/warmness.  Likely DJD, recommend to change from  dressing shoes to tennis shoes History of skin cancer: Sees dermatology regularly Social: Lives independently, still drives. RTC 6 months  Today, in addition to the CPX we address his chronic medical problems including hyperglycemia, HTN, hyperlipidemia, thyroid disease, CAD, atrial fibrillation, leg swelling, DJD including a new problem (pain at the right great toe).      This visit occurred during the SARS-CoV-2 public health emergency.  Safety protocols were in place, including screening questions prior to the visit, additional usage of staff PPE, and extensive cleaning of exam room while observing appropriate contact time as indicated for disinfecting solutions.

## 2019-10-12 NOTE — Assessment & Plan Note (Signed)
-  Td 2014 - pnm 2014, Prevnar 2016 - zostavax 2015 - shingrex shot discussed - had a flu shot  -Last cscope 2014 + polyps , no further cscope -Prostate cancer screening discuss, again declined further screening   -Labs: BMP, A1c, TSH -Diet, exercise discussed.

## 2019-10-12 NOTE — Assessment & Plan Note (Signed)
Here for CPX Hyperglycemia: Check A1c HTN: Continue with benazepril, diltiazem, Lasix, metoprolol.  Check a BMP.  Recommend to check BPs at home. Hyperlipidemia: On Lipitor, controlled Hypothyroidism: On Synthroid 175 mcg, check a TSH CAD, atrial fibrillation, carotid artery disease: Last visit with cardiology 05-2019, felt to be stable, next carotid ultrasound 12-2019. BNP was check 01/2019, around 300, Lasix was increased temporarily. Chronic right leg swelling: Right calf is symmetric when he wakes up, as the day goes by it increases in size. Rec leg elevation , compression stocking if so desired. DJD: Complains of right great toe pain, denies swelling/redness/warmness.  Likely DJD, recommend to change from  dressing shoes to tennis shoes History of skin cancer: Sees dermatology regularly Social: Lives independently, still drives. RTC 6 months

## 2019-10-17 ENCOUNTER — Encounter: Payer: Self-pay | Admitting: *Deleted

## 2019-10-17 ENCOUNTER — Other Ambulatory Visit: Payer: Self-pay | Admitting: Cardiovascular Disease

## 2019-10-17 MED ORDER — APIXABAN 5 MG PO TABS: 5.0000 mg | ORAL_TABLET | Freq: Two times a day (BID) | ORAL | 1 refills | Status: DC

## 2019-10-17 NOTE — Telephone Encounter (Signed)
Last OV 06/09/2019 Scr 1.16 om 10/11/2019 83 years olf 86 kg

## 2019-10-17 NOTE — Telephone Encounter (Signed)
This encounter was created in error - please disregard.

## 2019-10-17 NOTE — Telephone Encounter (Signed)
New Message     *STAT* If patient is at the pharmacy, call can be transferred to refill team.   1. Which medications need to be refilled? (please list name of each medication and dose if known) apixaban (ELIQUIS) 5 MG TABS tablet  2. Which pharmacy/location (including street and city if local pharmacy) is medication to be sent to? [place], [place] - [address] AT [place]  3. Do they need a 30 day or 90 day supply? 90 Pt will be out today

## 2019-10-18 ENCOUNTER — Other Ambulatory Visit: Payer: Self-pay

## 2019-10-18 MED ORDER — LEVOTHYROXINE SODIUM 150 MCG PO TABS: 150.0000 ug | ORAL_TABLET | Freq: Every day | ORAL | 1 refills | Status: DC

## 2019-10-18 MED ORDER — LEVOTHYROXINE SODIUM 25 MCG PO TABS: 25.0000 ug | ORAL_TABLET | Freq: Every day | ORAL | 1 refills | Status: DC

## 2019-10-25 DIAGNOSIS — Z1283 Encounter for screening for malignant neoplasm of skin: Secondary | ICD-10-CM | POA: Diagnosis not present

## 2019-10-25 DIAGNOSIS — X32XXXD Exposure to sunlight, subsequent encounter: Secondary | ICD-10-CM | POA: Diagnosis not present

## 2019-10-25 DIAGNOSIS — Z85820 Personal history of malignant melanoma of skin: Secondary | ICD-10-CM | POA: Diagnosis not present

## 2019-10-25 DIAGNOSIS — L570 Actinic keratosis: Secondary | ICD-10-CM | POA: Diagnosis not present

## 2019-10-25 DIAGNOSIS — L859 Epidermal thickening, unspecified: Secondary | ICD-10-CM | POA: Diagnosis not present

## 2019-10-25 DIAGNOSIS — Z08 Encounter for follow-up examination after completed treatment for malignant neoplasm: Secondary | ICD-10-CM | POA: Diagnosis not present

## 2019-10-25 DIAGNOSIS — L281 Prurigo nodularis: Secondary | ICD-10-CM | POA: Diagnosis not present

## 2019-11-23 ENCOUNTER — Ambulatory Visit: Payer: Medicare Other | Attending: Internal Medicine

## 2019-11-23 DIAGNOSIS — Z23 Encounter for immunization: Secondary | ICD-10-CM | POA: Insufficient documentation

## 2019-11-23 NOTE — Progress Notes (Signed)
   Covid-19 Vaccination Clinic  Name:  [name]    MRN: [id number] DOB: 1936-09-14  11/23/2019  [name] was observed post Covid-19 immunization for 15 minutes without incident. He was provided with Vaccine Information Sheet and instruction to access the V-Safe system.   [name] was instructed to call 911 with any severe reactions post vaccine: Marland Kitchen Difficulty breathing  . Swelling of face and throat  . A fast heartbeat  . A bad rash all over body  . Dizziness and weakness   Immunizations Administered    Name Date Dose VIS Date Route   Pfizer COVID-19 Vaccine 11/23/2019 12:48 PM 0.3 mL 09/01/2019 Intramuscular   Manufacturer: [place]   Lot: [id number]   [place]: [id number]

## 2019-11-30 NOTE — Progress Notes (Signed)
Date:  12/14/2019   ID:  [name], DOB 06/23/1936, MRN [id number]   PCP:  Colon Branch, MD  Cardiologist:   Johnsie Cancel Electrophysiologist:  None   Evaluation Performed:  Follow-Up Visit  Chief Complaint:  CAD/AFib  History of Present Illness:    [name] is a 84 y.o.  male with a history of CAD s/p DES to LCx (2006), carotid artery disease, HTN, HLD who presents to clinic for follow up. No angina compliant with meds Normal nuclear study 2017 prior to back surgery. LE Edema from varicosities and previous TKR takes lasix for this. Dr Larose Kells did venous duplex for swelling 02/07/19 and no DVT Lasix increased   10/20/18  found to be in new onset afib. He did not notice. No palpitations, mild exertional dyspnea chronic No chest pain.   Started on Eliquis CHA2DS2-Vasc is 4   He is asymptomatic Tolerating blood thinner   TTE done 10/13/18 reviewed EF normal 55-60% severe LAE  He is not interested in [facility] and would unlikely maintain NSR given LAE and age   He has venous reflux dx in right >left leg Improved with higher dose diuretic seen by Dr Donzetta Matters [facility] 05/19/19 and felt saphenous veins too small for intervention and would Rx conservatively with elevation, compression stockings and diuretics  Had injections for back with Dr Arnoldo Morale good relief of pain   Received COVID vaccine 11/23/19   No cardiac symptoms no palpitations or bleeding issues   Past Medical History:  Diagnosis Date  . Arthritis   . BPH (benign prostatic hypertrophy)   . Carotid artery disease ([place])    Doppler, December 08, 2011, 00 [id number] R. ICA, [id number] LICA, followup 1 year  . Coronary artery disease    DES Circumflex or CVA 2006  /  clear, February, 2011, EF 70%, no ischemia or  . Ejection fraction    EF 60%, echo, 2009, mildly calcified aortic leaflets  . History of colonic polyps   . Hyperlipidemia   . Hypertension   . Hypothyroidism   . Lumbar radiculopathy   . Multiple thyroid nodules    Avascular  echogenic areas noted in the right thyroid at the time of carotid Doppler.   Marland Kitchen PONV (postoperative nausea and vomiting)    "nausea with first hip surgery"  . Skin cancer (melanoma) (HCC)    PMH of   . Spinal stenosis, lumbar   . Tremor    Fine tremor right upper extremity  . Venous insufficiency    Past Surgical History:  Procedure Laterality Date  . COLONOSCOPY    . CORONARY ANGIOPLASTY WITH STENT PLACEMENT  2006   x 2 stents; DES to CX and dRCA '06  . KNEE SURGERY  1970's   "chipped bone"  . LUMBAR LAMINECTOMY/DECOMPRESSION MICRODISCECTOMY Left 07/13/2016   Procedure: LUMBAR LAMINECTOMY AND FORAMINOTOMY Lumbar two, three, Lumbar three-four, Lumbar four-five, LEFT Lumbar five-Sacral one DISECTOMY;  Surgeon: Newman Pies, MD;  Location: [facility];  Service: Neurosurgery;  Laterality: Left;  . TOE SURGERY  1997  . TONSILLECTOMY    . TOTAL HIP ARTHROPLASTY Left 2007  . TOTAL HIP ARTHROPLASTY Right 2010  . TOTAL KNEE ARTHROPLASTY Right 06/25/2014   Procedure: RIGHT TOTAL KNEE ARTHROPLASTY;  Surgeon: Gearlean Alf, MD;  Location: [facility];  Service: Orthopedics;  Laterality: Right;     Current Meds  Medication Sig  . apixaban (ELIQUIS) 5 MG TABS tablet Take 1 tablet (5 mg total) by mouth 2 (  two) times daily.  Marland Kitchen aspirin 81 MG tablet Take 1 tablet (81 mg total) by mouth daily.  Marland Kitchen atorvastatin (LIPITOR) 40 MG tablet TAKE 1/2 TABLET(20 MG) BY MOUTH AT BEDTIME  . benazepril (LOTENSIN) 20 MG tablet TAKE 1 AND 1/2 TABLETS(30 MG) BY MOUTH DAILY  . clobetasol cream (TEMOVATE) [id number] % Apply 1 application topically 2 (two) times daily as needed.  Marland Kitchen DILT-XR 180 MG 24 hr capsule TAKE 1 CAPSULE BY MOUTH DAILY  . furosemide (LASIX) 40 MG tablet Take 1.5 tablets (60 mg total) by mouth 2 (two) times daily.  . hydrocortisone 2.5 % cream Apply topically 2 (two) times daily as needed.  Marland Kitchen levothyroxine (SYNTHROID) 150 MCG tablet Take 1 tablet (150 mcg total) by mouth daily before breakfast. (Along w/ a  74mcg tablet daily)  . levothyroxine (SYNTHROID) 25 MCG tablet Take 1 tablet (25 mcg total) by mouth daily before breakfast. (Along w/ a 175mcg tablet daily)  . LUMIGAN 0.01 % SOLN Place 1 drop into both eyes 2 (two) times daily.   . metoprolol tartrate (LOPRESSOR) 25 MG tablet TAKE 1/2 TABLET BY MOUTH TWICE DAILY  . nitroGLYCERIN (NITROSTAT) 0.4 MG SL tablet PLACE 1 TABLET UNDER THE TONGUE EVERY 5 MINUTES AS NEEDED FOR CHEST PAIN  . Timolol Maleate 0.5 % (DAILY) SOLN Apply 1 drop to eye daily.     Allergies:   Amlodipine besylate   Social History   Tobacco Use  . Smoking status: Former Smoker    Types: Cigarettes    Quit date: 09/21/1984    Years since quitting: 35.2  . Smokeless tobacco: Never Used  . Tobacco comment: smoked 1958-1986, up to 1 ppd  Substance Use Topics  . Alcohol use: Yes    Comment: socially on occasion  . Drug use: No     Family Hx: The patient's family history includes Colon cancer (age of onset: 12) in his father; Coronary artery disease in his mother; Diabetes in his maternal grandfather and sister; Heart attack (age of onset: 13) in his sister; Heart attack (age of onset: 48) in his maternal grandfather; Hypertension in his father; Pancreatitis in his father. There is no history of Thyroid disease, Stroke, or Prostate cancer.  ROS:   Please see the history of present illness.     All other systems reviewed and are negative.   Prior CV studies:   The following studies were reviewed today:  Venous Duplex 02/07/19 TTE 10/13/18   Labs/Other Tests and Data Reviewed:    EKG:   SR rate 61 nonspecific ST changes 12/14/19   Recent Labs: 02/07/2019: ALT 19; Hemoglobin 14.9; Platelets 213.0; Pro B Natriuretic peptide (BNP) 333.0 10/11/2019: BUN 23; Creatinine, Ser 1.16; Potassium 4.2; Sodium 141; TSH 0.93   Recent Lipid Panel Lab Results  Component Value Date/Time   CHOL 126 05/10/2019 11:16 AM   CHOL 161 10/31/2014 08:24 AM   TRIG 114.0 05/10/2019 11:16  AM   TRIG 81 10/31/2014 08:24 AM   TRIG 86 07/23/2006 10:12 AM   HDL 41.10 05/10/2019 11:16 AM   HDL 49 10/31/2014 08:24 AM   CHOLHDL 3 05/10/2019 11:16 AM   LDLCALC 62 05/10/2019 11:16 AM   LDLCALC 96 10/31/2014 08:24 AM    Wt Readings from Last 3 Encounters:  12/14/19 190 lb (86.2 kg)  10/11/19 190 lb 8 oz (86.4 kg)  08/14/19 184 lb 4 oz (83.6 kg)     Objective:    Vital Signs:  BP 128/82   Pulse 61  Ht 5\' 9"  (1.753 m)   Wt 190 lb (86.2 kg)   SpO2 94%   BMI 28.06 kg/m    Affect appropriate Healthy:  appears stated age 65: normal Neck supple with no adenopathy JVP normal no bruits no thyromegaly Lungs clear with no wheezing and good diaphragmatic motion Heart:  S1/S2 no murmur, no rub, gallop or click PMI normal Abdomen: benighn, BS positve, no tenderness, no AAA no bruit.  No HSM or HJR Distal pulses intact with no bruits Plus 1 RLE edema plus 1 LLE with varicosities Neuro non-focal Skin warm and dry No muscular weakness    ASSESSMENT & PLAN:    LE edema: venous reflux f/u Dr Donzetta Matters better with higher dose lasix   CAD: DES to circumflex 2006  stable with low risk Myoview 03/31/16 . Continue ASA, statin and BB  Carotid artery disease: stable doppler study in 01/10/18  Plaque no significant stenosis . Repeat due in 2 years ( 12/2019 )  HLD: continue statin. Followed by Dr. Larose Kells  Hypothyroidism: continue synthroid followed by Dr. Irma Newness:  Post right TKR and lumbar surgery    Afib:  New diagnosis  09/28/18 On Eliquis 5 bid for CHADVASC 4.  Continue cardizem for rate control Given asymptomatic state normal EF by TTE 10/13/18 And severe LAE will adopt strategy of rate control and anticoagulation and not aggressive [facility]  COVID-19 Education: The signs and symptoms of COVID-19 were discussed with the patient and how to seek care for testing (follow up with PCP or arrange E-visit).  The importance of social distancing was discussed today.      Medication  Adjustments/Labs and Tests Ordered: Current medicines are reviewed at length with the patient today.  Concerns regarding medicines are outlined above.   Tests Ordered:  Carotid Duplex April 2021   Medication Changes:  None   Disposition:  Follow up in a year   Signed, Jenkins Rouge, MD  12/14/2019 10:17 AM    [place]

## 2019-12-14 ENCOUNTER — Encounter: Payer: Self-pay | Admitting: Cardiovascular Disease

## 2019-12-14 ENCOUNTER — Ambulatory Visit: Payer: Medicare Other | Admitting: Cardiovascular Disease

## 2019-12-14 ENCOUNTER — Other Ambulatory Visit: Payer: Self-pay

## 2019-12-14 VITALS — BP 128/82 | HR 61 | Ht 69.0 in | Wt 190.0 lb

## 2019-12-14 DIAGNOSIS — I4891 Unspecified atrial fibrillation: Secondary | ICD-10-CM

## 2019-12-14 NOTE — Patient Instructions (Addendum)
Medication Instructions:  *If you need a refill on your cardiac medications before your next appointment, please call your pharmacy*  Lab Work: If you have labs (blood work) drawn today and your tests are completely normal, you will receive your results only by: . MyChart Message (if you have MyChart) OR . A paper copy in the mail If you have any lab test that is abnormal or we need to change your treatment, we will call you to review the results.  Testing/Procedures: None ordered today.   Follow-Up: At [facility], you and your health needs are our priority.  As part of our continuing mission to provide you with exceptional heart care, we have created designated Provider Care Teams.  These Care Teams include your primary Cardiologist (physician) and Advanced Practice Providers (APPs -  Physician Assistants and Nurse Practitioners) who all work together to provide you with the care you need, when you need it.  We recommend signing up for the patient portal called "MyChart".  Sign up information is provided on this After Visit Summary.  MyChart is used to connect with patients for Virtual Visits (Telemedicine).  Patients are able to view lab/test results, encounter notes, upcoming appointments, etc.  Non-urgent messages can be sent to your provider as well.   To learn more about what you can do with MyChart, go to [email or web address].    Your next appointment:   1 year  The format for your next appointment:   In Person  Provider:   You may see Dr. Nishan or one of the following Advanced Practice Providers on your designated Care Team:    Lori Gerhardt, NP  Laura Ingold, NP  Jill McDaniel, NP    

## 2019-12-19 ENCOUNTER — Ambulatory Visit: Payer: Medicare Other | Attending: Internal Medicine

## 2019-12-19 DIAGNOSIS — Z23 Encounter for immunization: Secondary | ICD-10-CM

## 2019-12-19 NOTE — Progress Notes (Signed)
   Covid-19 Vaccination Clinic  Name:  CASSIE FINCKE    MRN: [id number] DOB: Sep 03, 1936  12/19/2019  Mr. Bucks was observed post Covid-19 immunization for 15 minutes without incident. He was provided with Vaccine Information Sheet and instruction to access the V-Safe system.   Mr. Nuckolls was instructed to call 911 with any severe reactions post vaccine: Marland Kitchen Difficulty breathing  . Swelling of face and throat  . A fast heartbeat  . A bad rash all over body  . Dizziness and weakness   Immunizations Administered    Name Date Dose VIS Date Route   Pfizer COVID-19 Vaccine 12/19/2019  3:45 PM 0.3 mL 09/01/2019 Intramuscular   Manufacturer: [place]   Lot: [id number]   [place]: [facility]  Name:  PINCUS BALYEAT    MRN: [id number] DOB: July 29, 1936  12/19/2019  Mr. Ramseyer was observed post Covid-19 immunization for 15 minutes without incident. He was provided with Vaccine Information Sheet and instruction to access the V-Safe system.   Mr. Sciullo was instructed to call 911 with any severe reactions post vaccine: Marland Kitchen Difficulty breathing  . Swelling of face and throat  . A fast heartbeat  . A bad rash all over body  . Dizziness and weakness   Immunizations Administered    Name Date Dose VIS Date Route   Pfizer COVID-19 Vaccine 12/19/2019  3:45 PM 0.3 mL 09/01/2019 Intramuscular   Manufacturer: [place]   Lot: [id number]   [place]: [id number]

## 2020-02-27 ENCOUNTER — Other Ambulatory Visit: Payer: Self-pay | Admitting: Cardiovascular Disease

## 2020-02-27 DIAGNOSIS — R601 Generalized edema: Secondary | ICD-10-CM

## 2020-03-06 DIAGNOSIS — H401134 Primary open-angle glaucoma, bilateral, indeterminate stage: Secondary | ICD-10-CM | POA: Diagnosis not present

## 2020-03-08 ENCOUNTER — Other Ambulatory Visit: Payer: Self-pay | Admitting: Internal Medicine

## 2020-03-11 MED ORDER — LEVOTHYROXINE SODIUM 150 MCG PO TABS: 150.0000 ug | ORAL_TABLET | Freq: Every day | ORAL | 0 refills | Status: DC

## 2020-03-15 DIAGNOSIS — Z96643 Presence of artificial hip joint, bilateral: Secondary | ICD-10-CM | POA: Diagnosis not present

## 2020-03-15 DIAGNOSIS — Z471 Aftercare following joint replacement surgery: Secondary | ICD-10-CM | POA: Diagnosis not present

## 2020-03-15 DIAGNOSIS — G8311 Monoplegia of lower limb affecting right dominant side: Secondary | ICD-10-CM | POA: Diagnosis not present

## 2020-03-15 DIAGNOSIS — Z96641 Presence of right artificial hip joint: Secondary | ICD-10-CM | POA: Diagnosis not present

## 2020-03-15 DIAGNOSIS — Z96642 Presence of left artificial hip joint: Secondary | ICD-10-CM | POA: Diagnosis not present

## 2020-03-22 ENCOUNTER — Telehealth: Payer: Self-pay | Admitting: Internal Medicine

## 2020-03-22 NOTE — Progress Notes (Signed)
  Chronic Care Management   Note  03/22/2020 Name: MARVELLE CAUDILL MRN: [id number] DOB: 1936-02-15  RAUNAK ANTUNA is a 84 y.o. year old male who is a primary care patient of Colon Branch, MD. I reached out to [name] by phone today in response to a referral sent by Mr. Wallace Keller Berenson's PCP, Colon Branch, MD.   Mr. Stankus was given information about Chronic Care Management services today including:  1. CCM service includes personalized support from designated clinical staff supervised by his physician, including individualized plan of care and coordination with other care providers 2. 24/7 contact phone numbers for assistance for urgent and routine care needs. 3. Service will only be billed when office clinical staff spend 20 minutes or more in a month to coordinate care. 4. Only one practitioner may furnish and bill the service in a calendar month. 5. The patient may stop CCM services at any time (effective at the end of the month) by phone call to the office staff.   Patient agreed to services and verbal consent obtained.   Follow up plan:   [place]

## 2020-04-02 DIAGNOSIS — C44519 Basal cell carcinoma of skin of other part of trunk: Secondary | ICD-10-CM | POA: Diagnosis not present

## 2020-04-02 DIAGNOSIS — M48062 Spinal stenosis, lumbar region with neurogenic claudication: Secondary | ICD-10-CM | POA: Diagnosis not present

## 2020-04-02 DIAGNOSIS — C44319 Basal cell carcinoma of skin of other parts of face: Secondary | ICD-10-CM | POA: Diagnosis not present

## 2020-04-02 DIAGNOSIS — R29898 Other symptoms and signs involving the musculoskeletal system: Secondary | ICD-10-CM | POA: Diagnosis not present

## 2020-04-03 DIAGNOSIS — M6281 Muscle weakness (generalized): Secondary | ICD-10-CM | POA: Diagnosis not present

## 2020-04-04 NOTE — Progress Notes (Signed)
I connected with Vishnu today by telephone and verified that I am speaking with the correct person using two identifiers. Location patient: home Location provider: work Persons participating in the virtual visit: patient, Marine scientist.    I discussed the limitations, risks, security and privacy concerns of performing an evaluation and management service by telephone and the availability of in person appointments. I also discussed with the patient that there may be a patient responsible charge related to this service. The patient expressed understanding and verbally consented to this telephonic visit.    Interactive audio and video telecommunications were attempted between this provider and patient, however failed, due to patient having technical difficulties OR patient did not have access to video capability.  We continued and completed visit with audio only.  Some vital signs may be absent or patient reported.    Subjective:   [name] is a 84 y.o. male who presents for Medicare Annual/Subsequent preventive examination.  Review of Systems    Cardiac Risk Factors include: advanced age (>42men, >94 women);dyslipidemia;hypertension;male gender     Objective:    Today's Vitals   04/05/20 1011  Weight: 185 lb (83.9 kg)   Body mass index is 27.32 kg/m.  Advanced Directives 04/05/2020 05/19/2019 07/02/2016 06/25/2014 06/25/2014 06/18/2014  Does Patient Have a Medical Advance Directive? Yes Yes Yes Yes - Yes  Type of Advance Directive [place];Living will [place];Living will [place];Living will Living will - Living will  Does patient want to make changes to medical advance directive? No - Patient declined No - Patient declined No - Patient declined No - Patient declined - No - Patient declined  Copy of [place] in Chart? No - copy requested - No - copy requested No - copy requested (No Data) No - copy requested     Current Medications (verified) Outpatient Encounter Medications as of 04/05/2020  Medication Sig  . apixaban (ELIQUIS) 5 MG TABS tablet Take 1 tablet (5 mg total) by mouth 2 (two) times daily.  Marland Kitchen aspirin 81 MG tablet Take 1 tablet (81 mg total) by mouth daily.  Marland Kitchen atorvastatin (LIPITOR) 40 MG tablet TAKE 1/2 TABLET(20 MG) BY MOUTH AT BEDTIME  . benazepril (LOTENSIN) 20 MG tablet TAKE 1 AND 1/2 TABLETS(30 MG) BY MOUTH DAILY  . clobetasol cream (TEMOVATE) 6.60 % Apply 1 application topically 2 (two) times daily as needed.  Marland Kitchen DILT-XR 180 MG 24 hr capsule TAKE 1 CAPSULE BY MOUTH DAILY  . furosemide (LASIX) 40 MG tablet TAKE 1 AND 1/2 TABLETS(60 MG) BY MOUTH TWICE DAILY  . hydrocortisone 2.5 % cream Apply topically 2 (two) times daily as needed.  Marland Kitchen levothyroxine (SYNTHROID) 150 MCG tablet Take 1 tablet (150 mcg total) by mouth daily before breakfast. (Along w/ a 75mcg tablet daily)  . levothyroxine (SYNTHROID) 25 MCG tablet TAKE 1 TABLET BY MOUTH DAILY BEFORE BREAKFAST ALONG WITH A 150 MCG TABLET DAILY  . LUMIGAN 0.01 % SOLN Place 1 drop into both eyes 2 (two) times daily.   . metoprolol tartrate (LOPRESSOR) 25 MG tablet TAKE 1/2 TABLET BY MOUTH TWICE DAILY  . Timolol Maleate 0.5 % (DAILY) SOLN Apply 1 drop to eye daily.  . nitroGLYCERIN (NITROSTAT) 0.4 MG SL tablet PLACE 1 TABLET UNDER THE TONGUE EVERY 5 MINUTES AS NEEDED FOR CHEST PAIN (Patient not taking: Reported on 04/05/2020)   No facility-administered encounter medications on file as of 04/05/2020.    Allergies (verified) Amlodipine besylate  History: Past Medical History:  Diagnosis Date  . Arthritis   . BPH (benign prostatic hypertrophy)   . Carotid artery disease ([place])    Doppler, December 08, 2011, 00 52% R. ICA, 77-82% LICA, followup 1 year  . Coronary artery disease    DES Circumflex or CVA 2006  /  clear, February, 2011, EF 70%, no ischemia or  . Ejection fraction    EF 60%, echo, 2009, mildly calcified aortic leaflets  .  History of colonic polyps   . Hyperlipidemia   . Hypertension   . Hypothyroidism   . Lumbar radiculopathy   . Multiple thyroid nodules    Avascular echogenic areas noted in the right thyroid at the time of carotid Doppler.   Marland Kitchen PONV (postoperative nausea and vomiting)    "nausea with first hip surgery"  . Skin cancer (melanoma) (HCC)    PMH of   . Spinal stenosis, lumbar   . Tremor    Fine tremor right upper extremity  . Venous insufficiency    Past Surgical History:  Procedure Laterality Date  . COLONOSCOPY    . CORONARY ANGIOPLASTY WITH STENT PLACEMENT  2006   x 2 stents; DES to CX and dRCA '06  . KNEE SURGERY  1970's   "chipped bone"  . LUMBAR LAMINECTOMY/DECOMPRESSION MICRODISCECTOMY Left 07/13/2016   Procedure: LUMBAR LAMINECTOMY AND FORAMINOTOMY Lumbar two, three, Lumbar three-four, Lumbar four-five, LEFT Lumbar five-Sacral one DISECTOMY;  Surgeon: Newman Pies, MD;  Location: [place];  Service: Neurosurgery;  Laterality: Left;  . TOE SURGERY  1997  . TONSILLECTOMY    . TOTAL HIP ARTHROPLASTY Left 2007  . TOTAL HIP ARTHROPLASTY Right 2010  . TOTAL KNEE ARTHROPLASTY Right 06/25/2014   Procedure: RIGHT TOTAL KNEE ARTHROPLASTY;  Surgeon: Gearlean Alf, MD;  Location: [facility];  Service: Orthopedics;  Laterality: Right;   Family History  Problem Relation Age of Onset  . Coronary artery disease Mother        carotid endarterectomy bilaterally  . Colon cancer Father 72  . Hypertension Father   . Pancreatitis Father   . Diabetes Sister   . Diabetes Maternal Grandfather   . Heart attack Maternal Grandfather 70  . Heart attack Sister 76  . Thyroid disease Neg Hx   . Stroke Neg Hx   . Prostate cancer Neg Hx    Social History   Socioeconomic History  . Marital status: Married    Spouse name: Not on file  . Number of children: 2  . Years of education: Not on file  . Highest education level: Not on file  Occupational History  . Occupation: retired, Retail buyer  Tobacco Use  . Smoking status: Former Smoker    Types: Cigarettes    Quit date: 09/21/1984    Years since quitting: 35.5  . Smokeless tobacco: Never Used  . Tobacco comment: smoked 1958-1986, up to 1 ppd  Vaping Use  . Vaping Use: Never used  Substance and Sexual Activity  . Alcohol use: Yes    Comment: socially on occasion  . Drug use: No  . Sexual activity: Not on file  Other Topics Concern  . Not on file  Social History Narrative   Household- pt and wife   2 daughters, one is a Therapist, sports @ [employer]   Social Determinants of Radio broadcast assistant Strain: Low Risk   . Difficulty of Paying Living Expenses: Not hard at all  Food Insecurity: No Food Insecurity  .  Worried About Charity fundraiser in the Last Year: Never true  . Ran Out of Food in the Last Year: Never true  Transportation Needs: No Transportation Needs  . Lack of Transportation (Medical): No  . Lack of Transportation (Non-Medical): No  Physical Activity:   . Days of Exercise per Week:   . Minutes of Exercise per Session:   Stress:   . Feeling of Stress :   Social Connections:   . Frequency of Communication with Friends and Family:   . Frequency of Social Gatherings with Friends and Family:   . Attends Religious Services:   . Active Member of Clubs or Organizations:   . Attends Archivist Meetings:   Marland Kitchen Marital Status:     Tobacco Counseling Counseling given: Not Answered Comment: smoked 1958-1986, up to 1 ppd   Clinical Intake: Pain : No/denies pain    Activities of Daily Living In your present state of health, do you have any difficulty performing the following activities: 04/05/2020 08/14/2019  Hearing? N N  Vision? N N  Difficulty concentrating or making decisions? N N  Walking or climbing stairs? N N  Dressing or bathing? N N  Doing errands, shopping? N N  Preparing Food and eating ? N -  Using the Toilet? N -  In the past six months, have you accidently leaked  urine? N -  Do you have problems with loss of bowel control? N -  Managing your Medications? N -  Managing your Finances? N -  Housekeeping or managing your Housekeeping? N -  Some recent data might be hidden    Patient Care Team: Colon Branch, MD as PCP - General (Internal Medicine) Josue Hector, MD as Consulting Physician (Cardiology) Allyn Kenner, MD (Dermatology) Gaynelle Arabian, MD as Consulting Physician (Orthopedic Surgery) Newman Pies, MD as Consulting Physician (Neurosurgery) Marygrace Drought, MD as Consulting Physician (Ophthalmology) Day, Melvenia Beam, [facility] as Pharmacist (Pharmacist)  Indicate any recent Medical Services you may have received from other than [facility] providers in the past year (date may be approximate).     Assessment:   This is a routine wellness examination for Dyan.  Dietary issues and exercise activities discussed: Current Exercise Habits: The patient does not participate in regular exercise at present (pt does do his own house and yard work), Exercise limited by: None identified Diet (meal preparation, eat out, water intake, caffeinated beverages, dairy products, fruits and vegetables): well balanced   Goals    . Increase physical activity      Depression Screen PHQ 2/9 Scores 04/05/2020 08/14/2019 05/11/2018 05/11/2017 05/04/2016 12/31/2015 09/18/2014  PHQ - 2 Score 0 0 0 0 0 0 0    Fall Risk Fall Risk  04/05/2020 05/10/2019 05/11/2018 05/11/2017 05/04/2016  Falls in the past year? 0 0 No No No  Number falls in past yr: 0 - - - -  Injury with Fall? 0 - - - -  Follow up Education provided;Falls prevention discussed - - - -   Lives w/ wife in 2 story home.  Any stairs in or around the home? Yes  If so, are there any without handrails? No  Home free of loose throw rugs in walkways, pet beds, electrical cords, etc? Yes  Adequate lighting in your home to reduce risk of falls? Yes   ASSISTIVE DEVICES UTILIZED TO PREVENT FALLS:  Life alert? No  Use of a  cane, walker or w/c? Yes  Grab bars in the bathroom? Yes  Shower  chair or bench in shower? No  Elevated toilet seat or a handicapped toilet? No     Cognitive Function: Ad8 score reviewed for issues:  Issues making decisions:no  Less interest in hobbies / activities:no  Repeats questions, stories (family complaining):no  Trouble using ordinary gadgets (microwave, computer, phone):no  Forgets the month or year: no  Mismanaging finances: no  Remembering appts:no  Daily problems with thinking and/or memory:no Ad8 score is=0        Immunizations Immunization History  Administered Date(s) Administered  . Fluad Quad(high Dose 65+) 08/04/2019  . Influenza Split 08/12/2011, 09/06/2012  . Influenza Whole 07/23/2006, 08/10/2007, 06/27/2008, 08/21/2009, 08/19/2010  . Influenza, High Dose Seasonal PF 07/20/2013, 09/19/2015, 09/07/2018  . Influenza,inj,Quad PF,6+ Mos 07/14/2016  . Influenza-Unspecified 06/21/2014, 10/01/2017  . PFIZER SARS-COV-2 Vaccination 11/23/2019, 12/19/2019  . Pneumococcal Conjugate-13 02/19/2015  . Pneumococcal Polysaccharide-23 09/01/2013  . Td 09/23/1998  . Tdap 09/01/2013  . Zoster 09/29/2013    TDAP status: Up to date Flu Vaccine status: Up to date Pneumococcal vaccine status: Up to date Covid-19 vaccine status: Completed vaccines  Qualifies for Shingles Vaccine? Yes   Zostavax completed Yes     Screening Tests Health Maintenance  Topic Date Due  . INFLUENZA VACCINE  04/21/2020  . TETANUS/TDAP  09/02/2023  . COVID-19 Vaccine  Completed  . PNA vac Low Risk Adult  Completed    Health Maintenance  There are no preventive care reminders to display for this patient.  Colorectal cancer screening: No longer required.   Lung Cancer Screening: (Low Dose CT Chest recommended if Age 53-80 years, 30 pack-year currently smoking OR have quit w/in 15years.) does not qualify.    Additional Screening:   Vision Screening: Recommended annual  ophthalmology exams for early detection of glaucoma and other disorders of the eye. Is the patient up to date with their annual eye exam?  Yes  Who is the provider or what is the name of the office in which the patient attends annual eye exams? Dr.Tanner   Dental Screening: Recommended annual dental exams for proper oral hygiene  Community Resource Referral / Chronic Care Management: CRR required this visit?  No   CCM required this visit?  No      Plan:    Please schedule your next medicare wellness visit with me in 1 yr.  Continue to eat heart healthy diet (full of fruits, vegetables, whole grains, lean protein, water--limit salt, fat, and sugar intake) and increase physical activity as tolerated.  Continue doing brain stimulating activities (puzzles, reading, adult coloring books, staying active) to keep memory sharp.   Bring a copy of your living will and/or healthcare power of attorney to your next office visit.  I have personally reviewed and noted the following in the patient's chart:   . Medical and social history . Use of alcohol, tobacco or illicit drugs  . Current medications and supplements . Functional ability and status . Nutritional status . Physical activity . Advanced directives . List of other physicians . Hospitalizations, surgeries, and ER visits in previous 12 months . Vitals . Screenings to include cognitive, depression, and falls . Referrals and appointments  In addition, I have reviewed and discussed with patient certain preventive protocols, quality metrics, and best practice recommendations. A written personalized care plan for preventive services as well as general preventive health recommendations were provided to patient.   Due to this being a telephonic visit, the after visit summary with patients personalized plan was offered to patient via  mail or my-chart.Patient would like to access on my-chart  Shela Nevin, [place]   04/05/2020

## 2020-04-05 ENCOUNTER — Ambulatory Visit (INDEPENDENT_AMBULATORY_CARE_PROVIDER_SITE_OTHER): Payer: Medicare Other | Admitting: *Deleted

## 2020-04-05 ENCOUNTER — Encounter: Payer: Self-pay | Admitting: *Deleted

## 2020-04-05 ENCOUNTER — Other Ambulatory Visit: Payer: Self-pay

## 2020-04-05 VITALS — Wt 185.0 lb

## 2020-04-05 DIAGNOSIS — Z0000 Encounter for general adult medical examination without abnormal findings: Secondary | ICD-10-CM | POA: Diagnosis not present

## 2020-04-05 NOTE — Patient Instructions (Signed)
Please schedule your next medicare wellness visit with me in 1 yr.  Continue to eat heart healthy diet (full of fruits, vegetables, whole grains, lean protein, water--limit salt, fat, and sugar intake) and increase physical activity as tolerated.  Continue doing brain stimulating activities (puzzles, reading, adult coloring books, staying active) to keep memory sharp.   Bring a copy of your living will and/or healthcare power of attorney to your next office visit.   [name] , Thank you for taking time to come for your Medicare Wellness Visit. I appreciate your ongoing commitment to your health goals. Please review the following plan we discussed and let me know if I can assist you in the future.   These are the goals we discussed: Goals    . Increase physical activity       This is a list of the screening recommended for you and due dates:  Health Maintenance  Topic Date Due  . Flu Shot  04/21/2020  . Tetanus Vaccine  09/02/2023  . COVID-19 Vaccine  Completed  . Pneumonia vaccines  Completed    Preventive Care 82 Years and Older, Male Preventive care refers to lifestyle choices and visits with your health care provider that can promote health and wellness. This includes:  A yearly physical exam. This is also called an annual well check.  Regular dental and eye exams.  Immunizations.  Screening for certain conditions.  Healthy lifestyle choices, such as diet and exercise. What can I expect for my preventive care visit? Physical exam Your health care provider will check:  Height and weight. These may be used to calculate body mass index (BMI), which is a measurement that tells if you are at a healthy weight.  Heart rate and blood pressure.  Your skin for abnormal spots. Counseling Your health care provider may ask you questions about:  Alcohol, tobacco, and drug use.  Emotional well-being.  Home and relationship well-being.  Sexual activity.  Eating  habits.  History of falls.  Memory and ability to understand (cognition).  Work and work Statistician. What immunizations do I need?  Influenza (flu) vaccine  This is recommended every year. Tetanus, diphtheria, and pertussis (Tdap) vaccine  You may need a Td booster every 10 years. Varicella (chickenpox) vaccine  You may need this vaccine if you have not already been vaccinated. Zoster (shingles) vaccine  You may need this after age 32. Pneumococcal conjugate (PCV13) vaccine  One dose is recommended after age 69. Pneumococcal polysaccharide (PPSV23) vaccine  One dose is recommended after age 54. Measles, mumps, and rubella (MMR) vaccine  You may need at least one dose of MMR if you were born in 1957 or later. You may also need a second dose. Meningococcal conjugate (MenACWY) vaccine  You may need this if you have certain conditions. Hepatitis A vaccine  You may need this if you have certain conditions or if you travel or work in places where you may be exposed to hepatitis A. Hepatitis B vaccine  You may need this if you have certain conditions or if you travel or work in places where you may be exposed to hepatitis B. Haemophilus influenzae type b (Hib) vaccine  You may need this if you have certain conditions. You may receive vaccines as individual doses or as more than one vaccine together in one shot (combination vaccines). Talk with your health care provider about the risks and benefits of combination vaccines. What tests do I need? Blood tests  Lipid and cholesterol  levels. These may be checked every 5 years, or more frequently depending on your overall health.  Hepatitis C test.  Hepatitis B test. Screening  Lung cancer screening. You may have this screening every year starting at age 60 if you have a 30-pack-year history of smoking and currently smoke or have quit within the past 15 years.  Colorectal cancer screening. All adults should have this  screening starting at age 14 and continuing until age 24. Your health care provider may recommend screening at age 74 if you are at increased risk. You will have tests every 1-10 years, depending on your results and the type of screening test.  Prostate cancer screening. Recommendations will vary depending on your family history and other risks.  Diabetes screening. This is done by checking your blood sugar (glucose) after you have not eaten for a while (fasting). You may have this done every 1-3 years.  Abdominal aortic aneurysm (AAA) screening. You may need this if you are a current or former smoker.  Sexually transmitted disease (STD) testing. Follow these instructions at home: Eating and drinking  Eat a diet that includes fresh fruits and vegetables, whole grains, lean protein, and low-fat dairy products. Limit your intake of foods with high amounts of sugar, saturated fats, and salt.  Take vitamin and mineral supplements as recommended by your health care provider.  Do not drink alcohol if your health care provider tells you not to drink.  If you drink alcohol: ? Limit how much you have to 0-2 drinks a day. ? Be aware of how much alcohol is in your drink. In the [place] one drink equals one 12 oz bottle of beer (355 mL), one 5 oz glass of wine (148 mL), or one 1 oz glass of hard liquor (44 mL). Lifestyle  Take daily care of your teeth and gums.  Stay active. Exercise for at least 30 minutes on 5 or more days each week.  Do not use any products that contain nicotine or tobacco, such as cigarettes, e-cigarettes, and chewing tobacco. If you need help quitting, ask your health care provider.  If you are sexually active, practice safe sex. Use a condom or other form of protection to prevent STIs (sexually transmitted infections).  Talk with your health care provider about taking a low-dose aspirin or statin. What's next?  Visit your health care provider once a year for a well check  visit.  Ask your health care provider how often you should have your eyes and teeth checked.  Stay up to date on all vaccines. This information is not intended to replace advice given to you by your health care provider. Make sure you discuss any questions you have with your health care provider. Document Revised: 09/01/2018 Document Reviewed: 09/01/2018 Elsevier Patient Education  2020 [employer].

## 2020-04-09 ENCOUNTER — Ambulatory Visit: Payer: Medicare Other | Admitting: Internal Medicine

## 2020-04-09 ENCOUNTER — Other Ambulatory Visit: Payer: Self-pay

## 2020-04-09 ENCOUNTER — Ambulatory Visit (INDEPENDENT_AMBULATORY_CARE_PROVIDER_SITE_OTHER): Payer: Medicare Other | Admitting: Internal Medicine

## 2020-04-09 ENCOUNTER — Encounter: Payer: Self-pay | Admitting: Internal Medicine

## 2020-04-09 VITALS — BP 142/80 | HR 66 | Temp 98.0°F | Resp 18 | Ht 69.0 in | Wt 189.2 lb

## 2020-04-09 DIAGNOSIS — I10 Essential (primary) hypertension: Secondary | ICD-10-CM

## 2020-04-09 DIAGNOSIS — E039 Hypothyroidism, unspecified: Secondary | ICD-10-CM

## 2020-04-09 DIAGNOSIS — E782 Mixed hyperlipidemia: Secondary | ICD-10-CM

## 2020-04-09 DIAGNOSIS — I779 Disorder of arteries and arterioles, unspecified: Secondary | ICD-10-CM

## 2020-04-09 MED ORDER — SHINGRIX 50 MCG/0.5ML IM SUSR: 0.5000 mL | Freq: Once | INTRAMUSCULAR | 1 refills | Status: AC

## 2020-04-09 NOTE — Progress Notes (Signed)
Pre visit review using our clinic review tool, if applicable. No additional management support is needed unless otherwise documented below in the visit note. 

## 2020-04-09 NOTE — Patient Instructions (Addendum)
Try to check your blood pressure twice a month at the pharmacy BP GOAL is between 110/65 and  135/85. If it is consistently higher or lower, let me know  We will schedule a carotid ultrasound  GO TO THE LAB : Get the blood work     [place], [place] back for a physical exam by January 2022

## 2020-04-09 NOTE — Progress Notes (Signed)
Subjective:    Patient ID: [name], male    DOB: [birth year], 84 y.o.   MRN: [id number]  DOS:  04/09/2020 Type of visit - description: Follow-up Since the last visit, saw cardiology, note reviewed. Also is having leg weakness, he consulted with orthopedic surgery and neurosurgery. No recent ambulatory BPs.  Good compliance with medications.  BP Readings from Last 3 Encounters:  04/09/20 (!) 142/80  12/14/19 128/82  10/11/19 (!) 141/81     Review of Systems Denies chest pain or difficulty breathing Right lower extremity edema at baseline. Denies any blood in the stools or blood in the urine  Past Medical History:  Diagnosis Date  . Arthritis   . BPH (benign prostatic hypertrophy)   . Carotid artery disease ([place])    Doppler, December 08, 2011, 00 02% R. ICA, 40-97% LICA, followup 1 year  . Coronary artery disease    DES Circumflex or CVA 2006  /  clear, February, 2011, EF 70%, no ischemia or  . Ejection fraction    EF 60%, echo, 2009, mildly calcified aortic leaflets  . History of colonic polyps   . Hyperlipidemia   . Hypertension   . Hypothyroidism   . Lumbar radiculopathy   . Multiple thyroid nodules    Avascular echogenic areas noted in the right thyroid at the time of carotid Doppler.   Marland Kitchen PONV (postoperative nausea and vomiting)    "nausea with first hip surgery"  . Skin cancer (melanoma) (HCC)    PMH of   . Spinal stenosis, lumbar   . Tremor    Fine tremor right upper extremity  . Venous insufficiency     Past Surgical History:  Procedure Laterality Date  . COLONOSCOPY    . CORONARY ANGIOPLASTY WITH STENT PLACEMENT  2006   x 2 stents; DES to CX and dRCA '06  . KNEE SURGERY  1970's   "chipped bone"  . LUMBAR LAMINECTOMY/DECOMPRESSION MICRODISCECTOMY Left 07/13/2016   Procedure: LUMBAR LAMINECTOMY AND FORAMINOTOMY Lumbar two, three, Lumbar three-four, Lumbar four-five, LEFT Lumbar five-Sacral one DISECTOMY;  Surgeon: Newman Pies, MD;  Location: [facility];   Service: Neurosurgery;  Laterality: Left;  . TOE SURGERY  1997  . TONSILLECTOMY    . TOTAL HIP ARTHROPLASTY Left 2007  . TOTAL HIP ARTHROPLASTY Right 2010  . TOTAL KNEE ARTHROPLASTY Right 06/25/2014   Procedure: RIGHT TOTAL KNEE ARTHROPLASTY;  Surgeon: Gearlean Alf, MD;  Location: [facility];  Service: Orthopedics;  Laterality: Right;    Allergies as of 04/09/2020      Reactions   Amlodipine Besylate Other (See Comments)   REACTION: edema      Medication List       Accurate as of April 09, 2020 11:59 PM. If you have any questions, ask your nurse or doctor.        apixaban 5 MG Tabs tablet Commonly known as: Eliquis Take 1 tablet (5 mg total) by mouth 2 (two) times daily.   aspirin 81 MG tablet Take 1 tablet (81 mg total) by mouth daily.   atorvastatin 40 MG tablet Commonly known as: LIPITOR TAKE 1/2 TABLET(20 MG) BY MOUTH AT BEDTIME   benazepril 20 MG tablet Commonly known as: LOTENSIN TAKE 1 AND 1/2 TABLETS(30 MG) BY MOUTH DAILY   clobetasol cream 0.05 % Commonly known as: TEMOVATE Apply 1 application topically 2 (two) times daily as needed.   Dilt-XR 180 MG 24 hr capsule Generic drug: diltiazem TAKE 1 CAPSULE BY MOUTH DAILY  furosemide 40 MG tablet Commonly known as: LASIX TAKE 1 AND 1/2 TABLETS(60 MG) BY MOUTH TWICE DAILY   hydrocortisone 2.5 % cream Apply topically 2 (two) times daily as needed.   levothyroxine 25 MCG tablet Commonly known as: SYNTHROID TAKE 1 TABLET BY MOUTH DAILY BEFORE BREAKFAST ALONG WITH A 150 MCG TABLET DAILY   levothyroxine 150 MCG tablet Commonly known as: SYNTHROID Take 1 tablet (150 mcg total) by mouth daily before breakfast. (Along w/ a 47mcg tablet daily)   Lumigan 0.01 % Soln Generic drug: bimatoprost Place 1 drop into both eyes 2 (two) times daily.   metoprolol tartrate 25 MG tablet Commonly known as: LOPRESSOR TAKE 1/2 TABLET BY MOUTH TWICE DAILY   nitroGLYCERIN 0.4 MG SL tablet Commonly known as: NITROSTAT PLACE  1 TABLET UNDER THE TONGUE EVERY 5 MINUTES AS NEEDED FOR CHEST PAIN   Shingrix injection Generic drug: Zoster Vaccine Adjuvanted Inject 0.5 mLs into the muscle once for 1 dose. Started by: [name], MD   Timolol Maleate 0.5 % (DAILY) Soln Apply 1 drop to eye daily.          Objective:   Physical Exam BP (!) 142/80 (BP Location: Left Arm)   Pulse 66   Temp 98 F (36.7 C) (Oral)   Resp 18   Ht 5\' 9"  (1.753 m)   Wt 189 lb 4 oz (85.8 kg)   SpO2 95%   BMI 27.95 kg/m  General:   Well developed, NAD, BMI noted. HEENT:  Normocephalic . Face symmetric, atraumatic Lungs:  CTA B Normal respiratory effort, no intercostal retractions, no accessory muscle use. Heart: Seems regular,  no murmur.  Lower extremities: Trace pretibial edema, right calf is no TTP larger by half inch in circumference. Skin: Not pale. Not jaundice Neurologic:  alert & oriented X3.  Speech normal, gait appropriate for age and assisted by a cane Psych--  Cognition and judgment appear intact.  Cooperative with normal attention span and concentration.  Behavior appropriate. No anxious or depressed appearing.      Assessment   Assessment  (Transfer from  Dr. Linna Darner [phone]) Hyperglycemia, A1c 6.2 2013 HTN Hyperlipidemia Hypothyroidism  Thyroid US 2014-- no nodules, inhomogeneous  CV: --CAD: stents 2006;  low risk nuclear scan 2011, 03-2016 --A Fib dx 09/2018 at regular crads OV, on Eliquis, EF --Carotid artery disease [place] 12/2014:   Stable, 1-39% RICA stenosis. Stable, 11-94% LICA stenosis. [place] 12/2015: Next 2 years R leg edema, [place] (-)   DVT 08-2014 and 01/2019 [facility] DJD: Dr Maureen Ralphs, back surgery Dr Arnoldo Morale 06-2016 Venous insufficiency, chronic R>L edema Tremor Glaucoma H/o skin cancer , denies Melanoma; sees derm, had a visit ~ 09/2017, Dr Nevada Crane  PLAN: Hyperglycemia: Last A1c is stable HTN: On metoprolol, diltiazem, Lasix, Lotensin.  BP upon arrival slightly elevated, recheck 142/80.  Check a CMP, CBC.   High cholesterol: On Lipitor, check FLP Hypothyroidism: On levothyroxine 175 mcg, check a TSH CAD, A. fib, carotid artery disease: Last visit with cardiology 11-2019.  Doing well, anticoagulated, was noted to be due  for a carotid US, ordering one. Chronic right leg swelling: At baseline. DJD: Recently noted his legs to be weak, saw orthopedics >>> Rx physical therapy, saw neurosurgery >>> Rx MRI of the back which is pending.  Uses a cane. Preventive care: Shingrix prescription printed, had Covid vaccination RTC 6 months  This visit occurred during the SARS-CoV-2 public health emergency.  Safety protocols were in place, including screening questions prior to the visit, additional  usage of staff PPE, and extensive cleaning of exam room while observing appropriate contact time as indicated for disinfecting solutions.

## 2020-04-10 DIAGNOSIS — M48062 Spinal stenosis, lumbar region with neurogenic claudication: Secondary | ICD-10-CM | POA: Diagnosis not present

## 2020-04-10 DIAGNOSIS — M48061 Spinal stenosis, lumbar region without neurogenic claudication: Secondary | ICD-10-CM | POA: Diagnosis not present

## 2020-04-10 DIAGNOSIS — M5126 Other intervertebral disc displacement, lumbar region: Secondary | ICD-10-CM | POA: Diagnosis not present

## 2020-04-10 DIAGNOSIS — M47816 Spondylosis without myelopathy or radiculopathy, lumbar region: Secondary | ICD-10-CM | POA: Diagnosis not present

## 2020-04-10 NOTE — Assessment & Plan Note (Addendum)
Hyperglycemia: Last A1c is stable HTN: On metoprolol, diltiazem, Lasix, Lotensin.  BP upon arrival slightly elevated, recheck 142/80.  Check a CMP, CBC.  High cholesterol: On Lipitor, check FLP Hypothyroidism: On levothyroxine 175 mcg, check a TSH CAD, A. fib, carotid artery disease: Last visit with cardiology 11-2019.  Doing well, anticoagulated, was noted to be due  for a carotid  US, ordering one. Chronic right leg swelling: At baseline. DJD: Recently noted his legs to be weak, saw orthopedics >>> Rx physical therapy, saw neurosurgery >>> Rx MRI of the back which is pending.  Uses a cane. Preventive care: Shingrix prescription printed, had Covid vaccination RTC 6 months

## 2020-04-12 DIAGNOSIS — M6281 Muscle weakness (generalized): Secondary | ICD-10-CM | POA: Diagnosis not present

## 2020-04-16 DIAGNOSIS — M6281 Muscle weakness (generalized): Secondary | ICD-10-CM | POA: Diagnosis not present

## 2020-04-18 DIAGNOSIS — M6281 Muscle weakness (generalized): Secondary | ICD-10-CM | POA: Diagnosis not present

## 2020-04-23 DIAGNOSIS — Z08 Encounter for follow-up examination after completed treatment for malignant neoplasm: Secondary | ICD-10-CM | POA: Diagnosis not present

## 2020-04-23 DIAGNOSIS — M6281 Muscle weakness (generalized): Secondary | ICD-10-CM | POA: Diagnosis not present

## 2020-04-23 DIAGNOSIS — C44319 Basal cell carcinoma of skin of other parts of face: Secondary | ICD-10-CM | POA: Diagnosis not present

## 2020-04-23 DIAGNOSIS — Z85828 Personal history of other malignant neoplasm of skin: Secondary | ICD-10-CM | POA: Diagnosis not present

## 2020-04-23 DIAGNOSIS — I872 Venous insufficiency (chronic) (peripheral): Secondary | ICD-10-CM | POA: Diagnosis not present

## 2020-04-23 DIAGNOSIS — X32XXXD Exposure to sunlight, subsequent encounter: Secondary | ICD-10-CM | POA: Diagnosis not present

## 2020-04-23 DIAGNOSIS — L570 Actinic keratosis: Secondary | ICD-10-CM | POA: Diagnosis not present

## 2020-04-24 ENCOUNTER — Other Ambulatory Visit: Payer: Self-pay | Admitting: Cardiovascular Disease

## 2020-04-25 DIAGNOSIS — M6281 Muscle weakness (generalized): Secondary | ICD-10-CM | POA: Diagnosis not present

## 2020-04-28 ENCOUNTER — Other Ambulatory Visit: Payer: Self-pay | Admitting: Cardiovascular Disease

## 2020-04-29 NOTE — Telephone Encounter (Signed)
Eliquis 5mg  refill request received. Patient is 84 years old, weight-85.8kg, Crea-1.16 on 10/11/2019, Diagnosis-Afib, and last seen by Dr. Johnsie Cancel on 12/14/2019. Dose is appropriate based on dosing criteria. Will send in refill to requested pharmacy.

## 2020-04-30 DIAGNOSIS — M6281 Muscle weakness (generalized): Secondary | ICD-10-CM | POA: Diagnosis not present

## 2020-05-02 DIAGNOSIS — M6281 Muscle weakness (generalized): Secondary | ICD-10-CM | POA: Diagnosis not present

## 2020-05-06 ENCOUNTER — Ambulatory Visit (HOSPITAL_COMMUNITY)
Admission: RE | Admit: 2020-05-06 | Payer: Medicare Other | Source: Ambulatory Visit | Attending: Internal Medicine | Admitting: Internal Medicine

## 2020-05-07 ENCOUNTER — Other Ambulatory Visit: Payer: Self-pay

## 2020-05-07 ENCOUNTER — Ambulatory Visit (HOSPITAL_COMMUNITY): Payer: Medicare Other

## 2020-05-07 ENCOUNTER — Ambulatory Visit (HOSPITAL_COMMUNITY)
Admission: RE | Admit: 2020-05-07 | Discharge: 2020-05-07 | Disposition: A | Discharge status: Home / Self Care | Payer: Medicare Other | Source: Ambulatory Visit | Attending: Cardiovascular Disease | Admitting: Cardiovascular Disease

## 2020-05-07 DIAGNOSIS — I779 Disorder of arteries and arterioles, unspecified: Secondary | ICD-10-CM | POA: Diagnosis not present

## 2020-05-14 ENCOUNTER — Telehealth: Payer: Self-pay | Admitting: Pharmacist

## 2020-05-14 DIAGNOSIS — C44319 Basal cell carcinoma of skin of other parts of face: Secondary | ICD-10-CM | POA: Diagnosis not present

## 2020-05-14 DIAGNOSIS — Z85828 Personal history of other malignant neoplasm of skin: Secondary | ICD-10-CM | POA: Diagnosis not present

## 2020-05-14 DIAGNOSIS — Z08 Encounter for follow-up examination after completed treatment for malignant neoplasm: Secondary | ICD-10-CM | POA: Diagnosis not present

## 2020-05-14 NOTE — Progress Notes (Addendum)
Chronic Care Management Pharmacy Assistant   Name: [name]  MRN: [id number] DOB: January 02, 1936  Reason for Encounter: Initial Question   PCP : Colon Branch, MD  Allergies:   Allergies  Allergen Reactions  . Amlodipine Besylate Other (See Comments)    REACTION: edema    Medications: Outpatient Encounter Medications as of 05/14/2020  Medication Sig  . aspirin 81 MG tablet Take 1 tablet (81 mg total) by mouth daily.  Marland Kitchen atorvastatin (LIPITOR) 40 MG tablet TAKE 1/2 TABLET(20 MG) BY MOUTH AT BEDTIME  . benazepril (LOTENSIN) 20 MG tablet TAKE 1 AND 1/2 TABLETS(30 MG) BY MOUTH DAILY  . clobetasol cream (TEMOVATE) 4.09 % Apply 1 application topically 2 (two) times daily as needed.  Marland Kitchen DILT-XR 180 MG 24 hr capsule TAKE 1 CAPSULE BY MOUTH DAILY  . ELIQUIS 5 MG TABS tablet TAKE 1 TABLET BY MOUTH TWICE DAILY  . furosemide (LASIX) 40 MG tablet TAKE 1 AND 1/2 TABLETS(60 MG) BY MOUTH TWICE DAILY  . levothyroxine (SYNTHROID) 150 MCG tablet Take 1 tablet (150 mcg total) by mouth daily before breakfast. (Along w/ a 85mcg tablet daily)  . levothyroxine (SYNTHROID) 25 MCG tablet TAKE 1 TABLET BY MOUTH DAILY BEFORE BREAKFAST ALONG WITH A 150 MCG TABLET DAILY  . LUMIGAN 0.01 % SOLN Place 1 drop into both eyes 2 (two) times daily.   . metoprolol tartrate (LOPRESSOR) 25 MG tablet TAKE 1/2 TABLET BY MOUTH TWICE DAILY  . nitroGLYCERIN (NITROSTAT) 0.4 MG SL tablet PLACE 1 TABLET UNDER THE TONGUE EVERY 5 MINUTES AS NEEDED FOR CHEST PAIN  . Timolol Maleate 0.5 % (DAILY) SOLN Apply 1 drop to eye daily.   No facility-administered encounter medications on file as of 05/14/2020.    Current Diagnosis: Patient Active Problem List   Diagnosis Date Noted  . Atrial fibrillation ([place]) 02/08/2019  . Lumbar stenosis with neurogenic claudication 07/13/2016  . Annual physical exam>>>>>>>>>>>>>>>>>> 05/04/2016  . PCP NOTES>>>>>>>>>>>>>>>>>>>>>>>>>>> 12/31/2015  . Edema 02/19/2015  . OA (osteoarthritis) of knee  06/25/2014  . Unspecified sleep apnea 06/18/2014  . Multiple thyroid nodules   . Tremor   . Carotid artery disease ([place])   . Erectile dysfunction 05/31/2012  . Coronary artery disease   . Venous insufficiency   . Hyperglycemia 11/09/2008  . MELANOMA, HX OF 11/09/2008  . COLONIC POLYPS, HYPERPLASTIC 05/10/2008  . Hypothyroidism 08/10/2007  . HYPERLIPIDEMIA 08/10/2007  . Essential hypertension 08/10/2007  . DJD (degenerative joint disease) 08/08/2007  . MURMUR 08/08/2007  . BENIGN PROSTATIC HYPERTROPHY, HX OF 08/05/2007    Goals Addressed   None     Any changes in your medications or health? No Any side effects from any medications? No Do you have an symptoms or problems not managed by your medications? No Any concerns about your health right now? No Has your provider asked that you check blood pressure, blood sugar, or follow special diet at home? Yes, check BP twice a month. Do you get any type of exercise on a regular basis? Yes Can you think of a goal you would like to reach for your health? Patient states he would like to "stay healthy". Do you have any problems getting your medications? No Is there anything that you would like to discuss during the appointment? No  Please bring medications and supplements to appointment  I inquired if the patient was able to get blood work done ordered by Dr. Larose Kells 04-09-2020. Patient stated he was unaware that he was supposed to get  blood work done.  Patient would like to reschedule his appointment to a phone visit for Friday August 27th 2021 at 9:30 am.  Follow-Up:  Pharmacist Review   Fanny Skates, [place] Pharmacist Assistant [phone]  Reviewed by: De Blanch, PharmD Clinical Pharmacist [facility] [phone]

## 2020-05-15 ENCOUNTER — Ambulatory Visit: Payer: Medicare Other

## 2020-05-17 ENCOUNTER — Ambulatory Visit: Payer: Medicare Other | Admitting: Pharmacist

## 2020-05-17 ENCOUNTER — Other Ambulatory Visit: Payer: Self-pay

## 2020-05-17 DIAGNOSIS — E782 Mixed hyperlipidemia: Secondary | ICD-10-CM

## 2020-05-17 DIAGNOSIS — R739 Hyperglycemia, unspecified: Secondary | ICD-10-CM

## 2020-05-17 DIAGNOSIS — I10 Essential (primary) hypertension: Secondary | ICD-10-CM

## 2020-05-17 NOTE — Patient Instructions (Signed)
Visit Information  Goals Addressed            This Visit's Progress   . Chronic Care Management Pharmacy Care Plan       CARE PLAN ENTRY (see longitudinal plan of care for additional care plan information)  Current Barriers:  . Chronic Disease Management support, education, and care coordination needs related to Hypertension, Hyperlipidemia/CAD, Pre-Diabetes AFib, Hypothyroidism, Edema   Hypertension BP Readings from Last 3 Encounters:  04/09/20 (!) 142/80  12/14/19 128/82  10/11/19 (!) 141/81   . Pharmacist Clinical Goal(s): o Over the next 180 days, patient will work with PharmD and providers to maintain BP goal <140/90 . Current regimen:  . Benazepril 20mg  1.5 daily . Diltiazem XR 180mg  daily . Metoprolol Tartrate 1/2 tab twice daily . Interventions: o Discussed BP goal . Patient self care activities - Over the next 90 days, patient will: o Maintain hypertension medication regimen.   Hyperlipidemia Lab Results  Component Value Date/Time   LDLCALC 62 05/10/2019 11:16 AM   LDLCALC 96 10/31/2014 08:24 AM   . Pharmacist Clinical Goal(s): o Over the next 180 days, patient will work with PharmD and providers to maintain LDL goal < 70 . Current regimen:  . Atorvastatin 40mg  1/2 tab daily at bedtime . Aspirin 81mg  daily . Nitroglycerin 0.4mg  as needed . Interventions: o Discussed LDL goal . Patient self care activities - Over the next 180 days, patient will: o Maintain cholesterol medication regimen.   Pre-Diabetes Lab Results  Component Value Date/Time   HGBA1C 6.2 10/11/2019 12:03 PM   HGBA1C 6.3 02/07/2019 02:40 PM   . Pharmacist Clinical Goal(s): o Over the next 180 days, patient will work with PharmD and providers to maintain A1c goal <6.5% . Current regimen:  o Diet and exercise management   . Patient self care activities - Over the next 180 days, patient will: o Maintain a1c <6.5%  Medication management . Pharmacist Clinical Goal(s): o Over the next  180 days, patient will work with PharmD and providers to maintain optimal medication adherence . Current pharmacy: [facility] . Interventions o Comprehensive medication review performed. o Continue current medication management strategy . Patient self care activities - Over the next 180 days, patient will: o Focus on medication adherence by filling and taking medications appropriately  o Take medications as prescribed o Report any questions or concerns to PharmD and/or provider(s)  Initial goal documentation        Mr. Borba was given information about Chronic Care Management services today including:  1. CCM service includes personalized support from designated clinical staff supervised by his physician, including individualized plan of care and coordination with other care providers 2. 24/7 contact phone numbers for assistance for urgent and routine care needs. 3. Standard insurance, coinsurance, copays and deductibles apply for chronic care management only during months in which we provide at least 20 minutes of these services. Most insurances cover these services at 100%, however patients may be responsible for any copay, coinsurance and/or deductible if applicable. This service may help you avoid the need for more expensive face-to-face services. 4. Only one practitioner may furnish and bill the service in a calendar month. 5. The patient may stop CCM services at any time (effective at the end of the month) by phone call to the office staff.  Patient agreed to services and verbal consent obtained.   The patient verbalized understanding of instructions provided today and agreed to receive a mailed copy of patient instruction and/or educational materials.  Telephone follow up appointment with pharmacy team member scheduled for: 11/15/2020  Melvenia Beam Leandre Wien, PharmD Clinical Pharmacist [facility] at [facility] [phone]   Prediabetes Eating Plan Prediabetes is a  condition that causes blood sugar (glucose) levels to be higher than normal. This increases the risk for developing diabetes. In order to prevent diabetes from developing, your health care provider may recommend a diet and other lifestyle changes to help you:  Control your blood glucose levels.  Improve your cholesterol levels.  Manage your blood pressure. Your health care provider may recommend working with a diet and nutrition specialist (dietitian) to make a meal plan that is best for you. What are tips for following this plan? Lifestyle  Set weight loss goals with the help of your health care team. It is recommended that most people with prediabetes lose 7% of their current body weight.  Exercise for at least 30 minutes at least 5 days a week.  Attend a support group or seek ongoing support from a mental health counselor.  Take over-the-counter and prescription medicines only as told by your health care provider. Reading food labels  Read food labels to check the amount of fat, salt (sodium), and sugar in prepackaged foods. Avoid foods that have: ? Saturated fats. ? Trans fats. ? Added sugars.  Avoid foods that have more than 300 milligrams (mg) of sodium per serving. Limit your daily sodium intake to less than 2,300 mg each Jessamy Torosyan. Shopping  Avoid buying pre-made and processed foods. Cooking  Cook with olive oil. Do not use butter, lard, or ghee.  Bake, broil, grill, or boil foods. Avoid frying. Meal planning   Work with your dietitian to develop an eating plan that is right for you. This may include: ? Tracking how many calories you take in. Use a food diary, notebook, or mobile application to track what you eat at each meal. ? Using the glycemic index (GI) to plan your meals. The index tells you how quickly a food will raise your blood glucose. Choose low-GI foods. These foods take a longer time to raise blood glucose.  Consider following a Mediterranean diet. This diet  includes: ? Several servings each Virgia Kelner of fresh fruits and vegetables. ? Eating fish at least twice a week. ? Several servings each Ricarda Atayde of whole grains, beans, nuts, and seeds. ? Using olive oil instead of other fats. ? Moderate alcohol consumption. ? Eating small amounts of red meat and whole-fat dairy.  If you have high blood pressure, you may need to limit your sodium intake or follow a diet such as the DASH eating plan. DASH is an eating plan that aims to lower high blood pressure. What foods are recommended? The items listed below may not be a complete list. Talk with your dietitian about what dietary choices are best for you. Grains Whole grains, such as whole-wheat or whole-grain breads, crackers, cereals, and pasta. Unsweetened oatmeal. Bulgur. Barley. Quinoa. Brown rice. Corn or whole-wheat flour tortillas or taco shells. Vegetables Lettuce. Spinach. Peas. Beets. Cauliflower. Cabbage. Broccoli. Carrots. Tomatoes. Squash. Eggplant. Herbs. Peppers. Onions. Cucumbers. Brussels sprouts. Fruits Berries. Bananas. Apples. Oranges. Grapes. Papaya. Mango. Pomegranate. Kiwi. Grapefruit. Cherries. Meats and other protein foods Seafood. Poultry without skin. Lean cuts of pork and beef. Tofu. Eggs. Nuts. Beans. Dairy Low-fat or fat-free dairy products, such as yogurt, cottage cheese, and cheese. Beverages Water. Tea. Coffee. Sugar-free or diet soda. Seltzer water. Lowfat or no-fat milk. Milk alternatives, such as soy or almond milk. Fats  and oils Olive oil. Canola oil. Sunflower oil. Grapeseed oil. Avocado. Walnuts. Sweets and desserts Sugar-free or low-fat pudding. Sugar-free or low-fat ice cream and other frozen treats. Seasoning and other foods Herbs. Sodium-free spices. Mustard. Relish. Low-fat, low-sugar ketchup. Low-fat, low-sugar barbecue sauce. Low-fat or fat-free mayonnaise. What foods are not recommended? The items listed below may not be a complete list. Talk with your dietitian  about what dietary choices are best for you. Grains Refined white flour and flour products, such as bread, pasta, snack foods, and cereals. Vegetables Canned vegetables. Frozen vegetables with butter or cream sauce. Fruits Fruits canned with syrup. Meats and other protein foods Fatty cuts of meat. Poultry with skin. Breaded or fried meat. Processed meats. Dairy Full-fat yogurt, cheese, or milk. Beverages Sweetened drinks, such as sweet iced tea and soda. Fats and oils Butter. Lard. Ghee. Sweets and desserts Baked goods, such as cake, cupcakes, pastries, cookies, and cheesecake. Seasoning and other foods Spice mixes with added salt. Ketchup. Barbecue sauce. Mayonnaise. Summary  To prevent diabetes from developing, you may need to make diet and other lifestyle changes to help control blood sugar, improve cholesterol levels, and manage your blood pressure.  Set weight loss goals with the help of your health care team. It is recommended that most people with prediabetes lose 7 percent of their current body weight.  Consider following a Mediterranean diet that includes plenty of fresh fruits and vegetables, whole grains, beans, nuts, seeds, fish, lean meat, low-fat dairy, and healthy oils. This information is not intended to replace advice given to you by your health care provider. Make sure you discuss any questions you have with your health care provider. Document Revised: 12/30/2018 Document Reviewed: 11/11/2016 Elsevier Patient Education  2020 [employer].

## 2020-05-17 NOTE — Chronic Care Management (AMB) (Signed)
Chronic Care Management Pharmacy  Name: [name]  MRN: [id number] DOB: [birth year]  Chief Complaint/ HPI  [name],  84 y.o. , male presents for their Initial CCM visit with the clinical pharmacist via telephone due to COVID-19 Pandemic.  PCP : Colon Branch, MD  Their chronic conditions include: Hypertension, Hyperlipidemia/CAD, Pre-Diabetes AFib, Hypothyroidism, Edema  Office Visits: 04/09/2020: Visit w/ Dr. Larose Kells - No med changes noted.  04/05/20: Medicare Annual Wellness Exam w/ Naaman Plummer, RN - Goal to increase physical activity  Consult Visit: 12/14/19: Cardio visit w/ Dr. Johnsie Cancel - No med changes noted.   Medications: Outpatient Encounter Medications as of 05/17/2020  Medication Sig  . aspirin 81 MG tablet Take 1 tablet (81 mg total) by mouth daily.  Marland Kitchen atorvastatin (LIPITOR) 40 MG tablet TAKE 1/2 TABLET(20 MG) BY MOUTH AT BEDTIME  . benazepril (LOTENSIN) 20 MG tablet TAKE 1 AND 1/2 TABLETS(30 MG) BY MOUTH DAILY  . clobetasol cream (TEMOVATE) 5.64 % Apply 1 application topically 2 (two) times daily as needed.  Marland Kitchen DILT-XR 180 MG 24 hr capsule TAKE 1 CAPSULE BY MOUTH DAILY  . ELIQUIS 5 MG TABS tablet TAKE 1 TABLET BY MOUTH TWICE DAILY  . furosemide (LASIX) 40 MG tablet TAKE 1 AND 1/2 TABLETS(60 MG) BY MOUTH TWICE DAILY  . levothyroxine (SYNTHROID) 150 MCG tablet Take 1 tablet (150 mcg total) by mouth daily before breakfast. (Along w/ a 42mcg tablet daily)  . levothyroxine (SYNTHROID) 25 MCG tablet TAKE 1 TABLET BY MOUTH DAILY BEFORE BREAKFAST ALONG WITH A 150 MCG TABLET DAILY  . LUMIGAN 0.01 % SOLN Place 1 drop into both eyes 2 (two) times daily.   . metoprolol tartrate (LOPRESSOR) 25 MG tablet TAKE 1/2 TABLET BY MOUTH TWICE DAILY  . nitroGLYCERIN (NITROSTAT) 0.4 MG SL tablet PLACE 1 TABLET UNDER THE TONGUE EVERY 5 MINUTES AS NEEDED FOR CHEST PAIN  . Timolol Maleate 0.5 % (DAILY) SOLN Apply 1 drop to eye daily.   No facility-administered encounter medications on file  as of 05/17/2020.   SDOH Screenings   Alcohol Screen:   . Last Alcohol Screening Score (AUDIT): Not on file  Depression (PHQ2-9): Low Risk   . PHQ-2 Score: 0  Financial Resource Strain: Low Risk   . Difficulty of Paying Living Expenses: Not hard at all  Food Insecurity: No Food Insecurity  . Worried About Charity fundraiser in the Last Year: Never true  . Ran Out of Food in the Last Year: Never true  Housing: Low Risk   . Last Housing Risk Score: 0  Physical Activity:   . Days of Exercise per Week: Not on file  . Minutes of Exercise per Session: Not on file  Social Connections:   . Frequency of Communication with Friends and Family: Not on file  . Frequency of Social Gatherings with Friends and Family: Not on file  . Attends Religious Services: Not on file  . Active Member of Clubs or Organizations: Not on file  . Attends Archivist Meetings: Not on file  . Marital Status: Not on file  Stress: Stress Concern Present  . Feeling of Stress : To some extent  Tobacco Use: Medium Risk  . Smoking Tobacco Use: Former Smoker  . Smokeless Tobacco Use: Never Used  Transportation Needs: No Transportation Needs  . Lack of Transportation (Medical): No  . Lack of Transportation (Non-Medical): No     Current Diagnosis/Assessment:  Goals Addressed  This Visit's Progress   . Chronic Care Management Pharmacy Care Plan       CARE PLAN ENTRY (see longitudinal plan of care for additional care plan information)  Current Barriers:  . Chronic Disease Management support, education, and care coordination needs related to Hypertension, Hyperlipidemia/CAD, Pre-Diabetes AFib, Hypothyroidism, Edema   Hypertension BP Readings from Last 3 Encounters:  04/09/20 (!) 142/80  12/14/19 128/82  10/11/19 (!) 141/81   . Pharmacist Clinical Goal(s): o Over the next 180 days, patient will work with PharmD and providers to maintain BP goal <140/90 . Current regimen:  . Benazepril  $RemoveBef'20mg'SKsBDFPvLW$  1.5 daily . Diltiazem XR $RemoveBefo'180mg'dIeIdvpZnoo$  daily . Metoprolol Tartrate 1/2 tab twice daily . Interventions: o Discussed BP goal . Patient self care activities - Over the next 90 days, patient will: o Maintain hypertension medication regimen.   Hyperlipidemia Lab Results  Component Value Date/Time   LDLCALC 62 05/10/2019 11:16 AM   LDLCALC 96 10/31/2014 08:24 AM   . Pharmacist Clinical Goal(s): o Over the next 180 days, patient will work with PharmD and providers to maintain LDL goal < 70 . Current regimen:  . Atorvastatin $RemoveBefore'40mg'DwfcmSFwKimFa$  1/2 tab daily at bedtime . Aspirin $RemoveB'81mg'OYHJvNjZ$  daily . Nitroglycerin 0.$RemoveBeforeDEI'4mg'UHiWvuQLlLGPLBUz$  as needed . Interventions: o Discussed LDL goal . Patient self care activities - Over the next 180 days, patient will: o Maintain cholesterol medication regimen.   Pre-Diabetes Lab Results  Component Value Date/Time   HGBA1C 6.2 10/11/2019 12:03 PM   HGBA1C 6.3 02/07/2019 02:40 PM   . Pharmacist Clinical Goal(s): o Over the next 180 days, patient will work with PharmD and providers to maintain A1c goal <6.5% . Current regimen:  o Diet and exercise management   . Patient self care activities - Over the next 180 days, patient will: o Maintain a1c <6.5%  Medication management . Pharmacist Clinical Goal(s): o Over the next 180 days, patient will work with PharmD and providers to maintain optimal medication adherence . Current pharmacy: [facility] . Interventions o Comprehensive medication review performed. o Continue current medication management strategy . Patient self care activities - Over the next 180 days, patient will: o Focus on medication adherence by filling and taking medications appropriately  o Take medications as prescribed o Report any questions or concerns to PharmD and/or provider(s)  Initial goal documentation      Social Hx: Patient's wife has a lot of leg pain and he needs to tend to her.  Hypertension   BP goal is:  <140/90  Office blood pressures are  BP  Readings from Last 3 Encounters:  04/09/20 (!) 142/80  12/14/19 128/82  10/11/19 (!) 141/81   Patient checks BP at home Never. Does not have a cuff. Feels they are too expensive Patient home BP readings are ranging: Unable to assess  Patient has failed these meds in the past: Spironolactone (listed in D/C meds. No apparent reason for D/C) Patient is currently borderline controlled on the following medications:  . Benazepril $RemoveBefo'20mg'YspEDqIQwnn$  1.5 daily . Diltiazem XR $RemoveBefo'180mg'xvjmgyWHcsX$  daily . Metoprolol Tartrate 1/2 tab twice daily  Only feels shaky when he hasn't eaten. Denies headaches or chest pains  We discussed BP goal  Plan -Continue current medications     Hyperlipidemia/CAD   LDL goal < 70  Lipid Panel     Component Value Date/Time   CHOL 126 05/10/2019 1116   CHOL 161 10/31/2014 0824   TRIG 114.0 05/10/2019 1116   TRIG 81 10/31/2014 0824   TRIG 86 07/23/2006 1012  HDL 41.10 05/10/2019 1116   HDL 49 10/31/2014 0824   LDLCALC 62 05/10/2019 1116   LDLCALC 96 10/31/2014 0824    Hepatic Function Latest Ref Rng & Units 02/07/2019 05/11/2018 05/11/2017  Total Protein 6.0 - 8.3 g/dL 7.0 6.6 7.3  Albumin 3.5 - 5.2 g/dL 4.4 4.1 4.1  AST 0 - 37 U/L 24 17 21   ALT 0 - 53 U/L 19 14 16   Alk Phosphatase 39 - 117 U/L 68 61 61  Total Bilirubin 0.2 - 1.2 mg/dL 0.8 0.6 0.7  Bilirubin, Direct 0.0 - 0.3 mg/dL - - -     The ASCVD Risk score (Fulton., et al., 2013) failed to calculate for the following reasons:   The 2013 ASCVD risk score is only valid for ages 29 to 74   Patient has failed these meds in past: None noted  Patient is currently controlled on the following medications:  . Atorvastatin 40mg  1/2 tab daily at bedtime . Aspirin 81mg  daily . Nitroglycerin 0.4mg  as needed  Has not had to use NTG tabs.   We discussed:  LDL goal  Plan -Continue current medications   Future Plan -Get patient atorvastatin 20mg  tabs so he doesn't have to cut pills in half  Pre-Diabetes   A1c goal  <6.5%  Recent Relevant Labs: Lab Results  Component Value Date/Time   HGBA1C 6.2 10/11/2019 12:03 PM   HGBA1C 6.3 02/07/2019 02:40 PM   GFR 60.34 10/11/2019 12:03 PM   GFR 67.07 02/07/2019 02:40 PM    Patient has failed these meds in past: None noted  Patient is currently controlled on the following medications: . None  "I eat real good"  Plan -Continue control with diet and exercise  AFIB   Patient is currently rate controlled.  Patient has failed these meds in past: None noted  Patient is currently controlled on the following medications:  Eliquis 5mg  twice daily . Diltiazem XR 180mg  daily . Metoprolol Tartrate 1/2 tab twice daily  Plan -Continue current medications   Hypothyroidism   Lab Results  Component Value Date/Time   TSH 0.93 10/11/2019 12:03 PM   TSH 0.68 05/10/2019 11:16 AM    Patient has failed these meds in past: None noted  Patient is currently controlled on the following medications:  . Levothyroxine 124mcg daily . Levothyroxine 30mcg daily  Plan -Continue current medications   Edema   Unable to fully assess due to patient's time constraints.  Patient has failed these meds in past: None noted  Patient is currently controlled on the following medications: . Furosemide 40mg  1.5 tabs daily  Plan -Continue current medications   Miscellaneous Meds Taking Cephalexin 500mg  TID for leg per Derm (takes PRN for "leg flare") Clobetasol 0.05% per derm Lumigan 0.01% Timolol 0.5%

## 2020-05-22 ENCOUNTER — Other Ambulatory Visit: Payer: Self-pay

## 2020-05-22 DIAGNOSIS — I10 Essential (primary) hypertension: Secondary | ICD-10-CM

## 2020-05-22 DIAGNOSIS — E039 Hypothyroidism, unspecified: Secondary | ICD-10-CM

## 2020-05-28 ENCOUNTER — Telehealth: Payer: Self-pay | Admitting: Internal Medicine

## 2020-05-28 DIAGNOSIS — E039 Hypothyroidism, unspecified: Secondary | ICD-10-CM

## 2020-05-28 DIAGNOSIS — E782 Mixed hyperlipidemia: Secondary | ICD-10-CM

## 2020-05-28 DIAGNOSIS — R739 Hyperglycemia, unspecified: Secondary | ICD-10-CM

## 2020-05-28 DIAGNOSIS — I10 Essential (primary) hypertension: Secondary | ICD-10-CM

## 2020-05-28 MED ORDER — LEVOTHYROXINE SODIUM 150 MCG PO TABS: 150.0000 ug | ORAL_TABLET | Freq: Every day | ORAL | 0 refills | Status: DC

## 2020-05-28 NOTE — Telephone Encounter (Signed)
Mitzo- will you call Pt- he did not have labs completed after his visit on 04/09/2020. Still needing these labs. Please schedule at his earliest convenience. He will need to be fasting.

## 2020-05-30 NOTE — Telephone Encounter (Signed)
lvm to schedule appointment 

## 2020-06-05 NOTE — Telephone Encounter (Signed)
Spoke to [name] he would like to get his lab done at the [facility] location tomorrow morning. Please change order location    Thank you.

## 2020-06-05 NOTE — Telephone Encounter (Signed)
Location changed to [place].

## 2020-06-06 ENCOUNTER — Other Ambulatory Visit (INDEPENDENT_AMBULATORY_CARE_PROVIDER_SITE_OTHER): Payer: Medicare Other

## 2020-06-06 DIAGNOSIS — E782 Mixed hyperlipidemia: Secondary | ICD-10-CM | POA: Diagnosis not present

## 2020-06-06 DIAGNOSIS — E039 Hypothyroidism, unspecified: Secondary | ICD-10-CM | POA: Diagnosis not present

## 2020-06-06 DIAGNOSIS — I10 Essential (primary) hypertension: Secondary | ICD-10-CM

## 2020-06-06 LAB — CBC WITH DIFFERENTIAL/PLATELET
Basophils Absolute: 0.1 10*3/uL (ref 0.0–0.1)
Basophils Relative: 1.8 % (ref 0.0–3.0)
Eosinophils Absolute: 0.7 10*3/uL (ref 0.0–0.7)
Eosinophils Relative: 9.5 % — ABNORMAL HIGH (ref 0.0–5.0)
HCT: 48.5 % (ref 39.0–52.0)
Hemoglobin: 16.1 g/dL (ref 13.0–17.0)
Lymphocytes Relative: 25 % (ref 12.0–46.0)
Lymphs Abs: 1.7 10*3/uL (ref 0.7–4.0)
MCHC: 33.2 g/dL (ref 30.0–36.0)
MCV: 92.3 fl (ref 78.0–100.0)
Monocytes Absolute: 0.8 10*3/uL (ref 0.1–1.0)
Monocytes Relative: 12.1 % — ABNORMAL HIGH (ref 3.0–12.0)
Neutro Abs: 3.6 10*3/uL (ref 1.4–7.7)
Neutrophils Relative %: 51.6 % (ref 43.0–77.0)
Platelets: 202 10*3/uL (ref 150.0–400.0)
RBC: 5.26 Mil/uL (ref 4.22–5.81)
RDW: 14.7 % (ref 11.5–15.5)
WBC: 6.9 10*3/uL (ref 4.0–10.5)

## 2020-06-06 LAB — LIPID PANEL
Cholesterol: 144 mg/dL (ref 0–200)
HDL: 46.6 mg/dL (ref >39.00)
LDL Cholesterol: 79 mg/dL (ref 0–99)
NonHDL: 96.98
Total CHOL/HDL Ratio: 3
Triglycerides: 89 mg/dL (ref 0.0–149.0)
VLDL: 17.8 mg/dL (ref 0.0–40.0)

## 2020-06-06 LAB — COMPREHENSIVE METABOLIC PANEL
ALT: 20 U/L (ref 0–53)
AST: 26 U/L (ref 0–37)
Albumin: 4.3 g/dL (ref 3.5–5.2)
Alkaline Phosphatase: 74 U/L (ref 39–117)
BUN: 21 mg/dL (ref 6–23)
CO2: 27 mEq/L (ref 19–32)
Calcium: 9.5 mg/dL (ref 8.4–10.5)
Chloride: 106 mEq/L (ref 96–112)
Creatinine, Ser: 1.09 mg/dL (ref 0.40–1.50)
GFR: 64.73 mL/min (ref >60.00)
Glucose, Bld: 93 mg/dL (ref 70–99)
Potassium: 3.7 mEq/L (ref 3.5–5.1)
Sodium: 140 mEq/L (ref 135–145)
Total Bilirubin: 0.8 mg/dL (ref 0.2–1.2)
Total Protein: 7 g/dL (ref 6.0–8.3)

## 2020-06-06 LAB — TSH: TSH: 0.44 u[IU]/mL (ref 0.35–4.50)

## 2020-06-06 NOTE — Addendum Note (Signed)
Addended by: Jacob Moores on: 06/06/2020 08:37 AM   Modules accepted: Orders

## 2020-06-17 ENCOUNTER — Other Ambulatory Visit: Payer: Self-pay | Admitting: Internal Medicine

## 2020-06-20 ENCOUNTER — Other Ambulatory Visit: Payer: Self-pay | Admitting: Internal Medicine

## 2020-06-26 ENCOUNTER — Other Ambulatory Visit: Payer: Self-pay | Admitting: Internal Medicine

## 2020-07-02 DIAGNOSIS — C44319 Basal cell carcinoma of skin of other parts of face: Secondary | ICD-10-CM | POA: Diagnosis not present

## 2020-08-21 DIAGNOSIS — L570 Actinic keratosis: Secondary | ICD-10-CM | POA: Diagnosis not present

## 2020-08-21 DIAGNOSIS — Z08 Encounter for follow-up examination after completed treatment for malignant neoplasm: Secondary | ICD-10-CM | POA: Diagnosis not present

## 2020-08-21 DIAGNOSIS — X32XXXD Exposure to sunlight, subsequent encounter: Secondary | ICD-10-CM | POA: Diagnosis not present

## 2020-08-21 DIAGNOSIS — Z85828 Personal history of other malignant neoplasm of skin: Secondary | ICD-10-CM | POA: Diagnosis not present

## 2020-08-21 DIAGNOSIS — Z85820 Personal history of malignant melanoma of skin: Secondary | ICD-10-CM | POA: Diagnosis not present

## 2020-08-28 DIAGNOSIS — H2513 Age-related nuclear cataract, bilateral: Secondary | ICD-10-CM | POA: Diagnosis not present

## 2020-08-28 DIAGNOSIS — H524 Presbyopia: Secondary | ICD-10-CM | POA: Diagnosis not present

## 2020-08-28 DIAGNOSIS — H43813 Vitreous degeneration, bilateral: Secondary | ICD-10-CM | POA: Diagnosis not present

## 2020-08-28 DIAGNOSIS — H401134 Primary open-angle glaucoma, bilateral, indeterminate stage: Secondary | ICD-10-CM | POA: Diagnosis not present

## 2020-10-08 ENCOUNTER — Telehealth: Payer: Self-pay | Admitting: Pharmacist

## 2020-10-08 NOTE — Progress Notes (Addendum)
Chronic Care Management Pharmacy Assistant   Name: [name]  MRN: [id number] DOB: [birth year]  Reason for Encounter: HTN Disease State  Patient Questions:  1.  Have you seen any other providers since your last visit? No  2.  Any changes in your medicines or health? No    PCP : Colon Branch, MD   Their chronic conditions include: Hypertension, Hyperlipidemia/CAD, Pre-Diabetes AFib, Hypothyroidism, Edema  Office Visits: None since their last CCM visit with the clinical pharmacist on 05-17-2020.  Consults: None since their last CCM visit with the clinical pharmacist on 05-17-2020.  Allergies:   Allergies  Allergen Reactions   Amlodipine Besylate Other (See Comments)    REACTION: edema    Medications: Outpatient Encounter Medications as of 10/08/2020  Medication Sig   aspirin 81 MG tablet Take 1 tablet (81 mg total) by mouth daily.   atorvastatin (LIPITOR) 40 MG tablet Take 0.5 tablets (20 mg total) by mouth at bedtime.   benazepril (LOTENSIN) 20 MG tablet Take 1.5 tablets (30 mg total) by mouth daily.   clobetasol cream (TEMOVATE) 3.71 % Apply 1 application topically 2 (two) times daily as needed.   diltiazem (DILT-XR) 180 MG 24 hr capsule Take 1 capsule (180 mg total) by mouth daily.   ELIQUIS 5 MG TABS tablet TAKE 1 TABLET BY MOUTH TWICE DAILY   furosemide (LASIX) 40 MG tablet TAKE 1 AND 1/2 TABLETS(60 MG) BY MOUTH TWICE DAILY   levothyroxine (SYNTHROID) 150 MCG tablet Take 1 tablet (150 mcg total) by mouth daily before breakfast. (Along w/ a 29mcg tablet daily)   levothyroxine (SYNTHROID) 25 MCG tablet TAKE 1 TABLET BY MOUTH DAILY BEFORE BREAKFAST ALONG WITH A 150 MCG TABLET DAILY   LUMIGAN 0.01 % SOLN Place 1 drop into both eyes 2 (two) times daily.    metoprolol tartrate (LOPRESSOR) 25 MG tablet Take 0.5 tablets (12.5 mg total) by mouth 2 (two) times daily.   nitroGLYCERIN (NITROSTAT) 0.4 MG SL tablet PLACE 1 TABLET UNDER THE TONGUE EVERY 5 MINUTES AS NEEDED FOR CHEST  PAIN   Timolol Maleate 0.5 % (DAILY) SOLN Apply 1 drop to eye daily.   No facility-administered encounter medications on file as of 10/08/2020.    Current Diagnosis: Patient Active Problem List   Diagnosis Date Noted   Atrial fibrillation ([place]) 02/08/2019   Lumbar stenosis with neurogenic claudication 07/13/2016   Annual physical exam>>>>>>>>>>>>>>>>>> 05/04/2016   PCP NOTES>>>>>>>>>>>>>>>>>>>>>>>>>>> 12/31/2015   Edema 02/19/2015   OA (osteoarthritis) of knee 06/25/2014   Unspecified sleep apnea 06/18/2014   Multiple thyroid nodules    Tremor    Carotid artery disease ([place])    Erectile dysfunction 05/31/2012   Coronary artery disease    Venous insufficiency    Hyperglycemia 11/09/2008   MELANOMA, HX OF 11/09/2008   COLONIC POLYPS, HYPERPLASTIC 05/10/2008   Hypothyroidism 08/10/2007   HYPERLIPIDEMIA 08/10/2007   Essential hypertension 08/10/2007   DJD (degenerative joint disease) 08/08/2007   MURMUR 08/08/2007   BENIGN PROSTATIC HYPERTROPHY, HX OF 08/05/2007    Goals Addressed   None    Reviewed chart prior to disease state call. Spoke with patient regarding BP  Recent Office Vitals: BP Readings from Last 3 Encounters:  04/09/20 (!) 142/80  12/14/19 128/82  10/11/19 (!) 141/81   Pulse Readings from Last 3 Encounters:  04/09/20 66  12/14/19 61  10/11/19 (!) 55    Wt Readings from Last 3 Encounters:  04/09/20 189 lb 4 oz (85.8 kg)  04/05/20 185  lb (83.9 kg)  12/14/19 190 lb (86.2 kg)     Kidney Function Lab Results  Component Value Date/Time   CREATININE 1.09 06/06/2020 08:37 AM   CREATININE 1.16 10/11/2019 12:03 PM   CREATININE 1.11 09/02/2016 10:19 AM   GFR 64.73 06/06/2020 08:37 AM   GFRNONAA 61 04/27/2019 10:52 AM   GFRAA 70 04/27/2019 10:52 AM    BMP Latest Ref Rng & Units 06/06/2020 10/11/2019 04/27/2019  Glucose 70 - 99 mg/dL 93 86 97  BUN 6 - 23 mg/dL 21 23 22   Creatinine 0.40 - 1.50 mg/dL 1.09 1.16 1.13  BUN/Creat Ratio 10 - 24 - - 19  Sodium  135 - 145 mEq/L 140 141 141  Potassium 3.5 - 5.1 mEq/L 3.7 4.2 4.1  Chloride 96 - 112 mEq/L 106 105 101  CO2 19 - 32 mEq/L 27 28 22   Calcium 8.4 - 10.5 mg/dL 9.5 9.5 9.7    Current antihypertensive regimen:  Benazepril 20 mg 1.5 daily Diltiazem XR 180 mg daily Metoprolol Tartrate 25 mg 1/2 tab twice daily  How often are you checking your Blood Pressure? infrequently Patient did not have a BP machine at home at the time. Recently got one for Christmas. Stated he would start to check it weekly and record the readings.  Current home BP readings: None  What recent interventions/DTPs have been made by any provider to improve Blood Pressure control since last CPP Visit: None  Any recent hospitalizations or ED visits since last visit with CPP? No   What diet changes have been made to improve Blood Pressure Control?  Patient states he eats home cooked meals most of the time prepared by his wife. Does not eat much fast food food.  What exercise is being done to improve your Blood Pressure Control?  Patient is not getting any physical activity on the regular basis.  Adherence Review: Is the patient currently on ACE/ARB medication? Yes Benazepril 20 mg Does the patient have >5 day gap between last estimated fill dates? No CPP please confirm   Follow-Up:  Pharmacist Review   Fanny Skates, [place] Pharmacist Assistant [phone]  4 minutes spent in review, coordination, and documentation.  Reviewed by: Beverly Milch, PharmD Clinical Pharmacist [facility] [phone]

## 2020-10-11 ENCOUNTER — Encounter: Payer: Medicare Other | Admitting: Internal Medicine

## 2020-10-20 ENCOUNTER — Other Ambulatory Visit: Payer: Self-pay | Admitting: Internal Medicine

## 2020-10-21 MED ORDER — LEVOTHYROXINE SODIUM 25 MCG PO TABS: ORAL_TABLET | ORAL | 0 refills | Status: DC

## 2020-10-22 ENCOUNTER — Other Ambulatory Visit: Payer: Self-pay | Admitting: Cardiovascular Disease

## 2020-10-22 NOTE — Telephone Encounter (Signed)
Eliquis 5mg  refill request received. Patient is 85 years old, weight-85.8kg, Crea-1.09 on 06/06/2020, Diagnosis-Afib, and last seen by Dr. Johnsie Cancel on 12/14/2019 and has an appt pending on 11/18/2020. Dose is appropriate based on dosing criteria. Will send in refill to requested pharmacy.

## 2020-10-25 ENCOUNTER — Other Ambulatory Visit: Payer: Self-pay

## 2020-10-25 ENCOUNTER — Encounter: Payer: Self-pay | Admitting: Internal Medicine

## 2020-10-25 ENCOUNTER — Ambulatory Visit (INDEPENDENT_AMBULATORY_CARE_PROVIDER_SITE_OTHER): Payer: Medicare Other | Admitting: Internal Medicine

## 2020-10-25 VITALS — BP 122/74 | HR 55 | Temp 98.2°F | Resp 18 | Ht 69.0 in | Wt 196.4 lb

## 2020-10-25 DIAGNOSIS — Z0000 Encounter for general adult medical examination without abnormal findings: Secondary | ICD-10-CM | POA: Diagnosis not present

## 2020-10-25 DIAGNOSIS — L97919 Non-pressure chronic ulcer of unspecified part of right lower leg with unspecified severity: Secondary | ICD-10-CM | POA: Diagnosis not present

## 2020-10-25 DIAGNOSIS — E039 Hypothyroidism, unspecified: Secondary | ICD-10-CM

## 2020-10-25 DIAGNOSIS — E782 Mixed hyperlipidemia: Secondary | ICD-10-CM | POA: Diagnosis not present

## 2020-10-25 DIAGNOSIS — I10 Essential (primary) hypertension: Secondary | ICD-10-CM | POA: Diagnosis not present

## 2020-10-25 DIAGNOSIS — Z23 Encounter for immunization: Secondary | ICD-10-CM

## 2020-10-25 DIAGNOSIS — R739 Hyperglycemia, unspecified: Secondary | ICD-10-CM | POA: Diagnosis not present

## 2020-10-25 DIAGNOSIS — Z0001 Encounter for general adult medical examination with abnormal findings: Secondary | ICD-10-CM

## 2020-10-25 LAB — COMPREHENSIVE METABOLIC PANEL
ALT: 17 U/L (ref 0–53)
AST: 21 U/L (ref 0–37)
Albumin: 4.2 g/dL (ref 3.5–5.2)
Alkaline Phosphatase: 68 U/L (ref 39–117)
BUN: 17 mg/dL (ref 6–23)
CO2: 33 mEq/L — ABNORMAL HIGH (ref 19–32)
Calcium: 9.9 mg/dL (ref 8.4–10.5)
Chloride: 104 mEq/L (ref 96–112)
Creatinine, Ser: 1.12 mg/dL (ref 0.40–1.50)
GFR: 61.14 mL/min (ref >60.00)
Glucose, Bld: 88 mg/dL (ref 70–99)
Potassium: 4.6 mEq/L (ref 3.5–5.1)
Sodium: 141 mEq/L (ref 135–145)
Total Bilirubin: 0.8 mg/dL (ref 0.2–1.2)
Total Protein: 6.7 g/dL (ref 6.0–8.3)

## 2020-10-25 LAB — LIPID PANEL
Cholesterol: 158 mg/dL (ref 0–200)
HDL: 49.6 mg/dL (ref >39.00)
LDL Cholesterol: 91 mg/dL (ref 0–99)
NonHDL: 108.1
Total CHOL/HDL Ratio: 3
Triglycerides: 84 mg/dL (ref 0.0–149.0)
VLDL: 16.8 mg/dL (ref 0.0–40.0)

## 2020-10-25 LAB — HEMOGLOBIN A1C: Hgb A1c MFr Bld: 5.9 % (ref 4.6–6.5)

## 2020-10-25 LAB — TSH: TSH: 6.8 u[IU]/mL — ABNORMAL HIGH (ref 0.35–4.50)

## 2020-10-25 NOTE — Progress Notes (Signed)
Subjective:    Patient ID: [name], male    DOB: 08/01/1936, 85 y.o.   MRN: [id number]  DOS:  10/25/2020 Type of visit - description: CPX Since the last office visit she is doing well. BP today is elevated, ambulatory BPs?  150s?Marland Kitchen Continue with low back pain, see previous visit, symptoms are on and off.   Review of Systems  Other than above, a 14 point review of systems is negative      Past Medical History:  Diagnosis Date  . Arthritis   . BPH (benign prostatic hypertrophy)   . Carotid artery disease ([place])    Doppler, December 08, 2011, 00 76% R. ICA, 19-50% LICA, followup 1 year  . Coronary artery disease    DES Circumflex or CVA 2006  /  clear, February, 2011, EF 70%, no ischemia or  . Ejection fraction    EF 60%, echo, 2009, mildly calcified aortic leaflets  . History of colonic polyps   . Hyperlipidemia   . Hypertension   . Hypothyroidism   . Lumbar radiculopathy   . Multiple thyroid nodules    Avascular echogenic areas noted in the right thyroid at the time of carotid Doppler.   Marland Kitchen PONV (postoperative nausea and vomiting)    "nausea with first hip surgery"  . Skin cancer (melanoma) (HCC)    PMH of   . Spinal stenosis, lumbar   . Tremor    Fine tremor right upper extremity  . Venous insufficiency     Past Surgical History:  Procedure Laterality Date  . COLONOSCOPY    . CORONARY ANGIOPLASTY WITH STENT PLACEMENT  2006   x 2 stents; DES to CX and dRCA '06  . KNEE SURGERY  1970's   "chipped bone"  . LUMBAR LAMINECTOMY/DECOMPRESSION MICRODISCECTOMY Left 07/13/2016   Procedure: LUMBAR LAMINECTOMY AND FORAMINOTOMY Lumbar two, three, Lumbar three-four, Lumbar four-five, LEFT Lumbar five-Sacral one DISECTOMY;  Surgeon: Newman Pies, MD;  Location: [place];  Service: Neurosurgery;  Laterality: Left;  . TOE SURGERY  1997  . TONSILLECTOMY    . TOTAL HIP ARTHROPLASTY Left 2007  . TOTAL HIP ARTHROPLASTY Right 2010  . TOTAL KNEE ARTHROPLASTY Right 06/25/2014    Procedure: RIGHT TOTAL KNEE ARTHROPLASTY;  Surgeon: Gearlean Alf, MD;  Location: [facility];  Service: Orthopedics;  Laterality: Right;    Allergies as of 10/25/2020      Reactions   Amlodipine Besylate Other (See Comments)   REACTION: edema      Medication List       Accurate as of October 25, 2020 11:59 PM. If you have any questions, ask your nurse or doctor.        aspirin 81 MG tablet Take 1 tablet (81 mg total) by mouth daily.   atorvastatin 40 MG tablet Commonly known as: LIPITOR Take 0.5 tablets (20 mg total) by mouth at bedtime.   benazepril 20 MG tablet Commonly known as: LOTENSIN Take 1.5 tablets (30 mg total) by mouth daily.   clobetasol cream 0.05 % Commonly known as: TEMOVATE Apply 1 application topically 2 (two) times daily as needed.   diltiazem 180 MG 24 hr capsule Commonly known as: Dilt-XR Take 1 capsule (180 mg total) by mouth daily.   Eliquis 5 MG Tabs tablet Generic drug: apixaban TAKE 1 TABLET BY MOUTH TWICE DAILY   furosemide 40 MG tablet Commonly known as: LASIX TAKE 1 AND 1/2 TABLETS(60 MG) BY MOUTH TWICE DAILY   levothyroxine 150 MCG tablet Commonly known  as: SYNTHROID TAKE 1 TABLET BY MOUTH DAILY BEFORE BREAKFAST. TAKE WITH 25MCG   levothyroxine 25 MCG tablet Commonly known as: SYNTHROID TAKE 1 TABLET BY MOUTH DAILY BEFORE BREAKFAST ALONG WITH A 150 MCG TABLET DAILY   Lumigan 0.01 % Soln Generic drug: bimatoprost Place 1 drop into both eyes 2 (two) times daily.   metoprolol tartrate 25 MG tablet Commonly known as: LOPRESSOR Take 0.5 tablets (12.5 mg total) by mouth 2 (two) times daily.   nitroGLYCERIN 0.4 MG SL tablet Commonly known as: NITROSTAT PLACE 1 TABLET UNDER THE TONGUE EVERY 5 MINUTES AS NEEDED FOR CHEST PAIN   Timolol Maleate (Once-Daily) 0.5 % Soln Apply 1 drop to eye daily.          Objective:   Physical Exam Skin:        BP 122/74 (BP Location: Left Arm, Patient Position: Sitting, Cuff Size: Normal)    Pulse (!) 55   Temp 98.2 F (36.8 C) (Oral)   Resp 18   Ht 5\' 9"  (1.753 m)   Wt 196 lb 6 oz (89.1 kg)   SpO2 96%   BMI 29.00 kg/m  General: Well developed, NAD, BMI noted Neck: No  thyromegaly  HEENT:  Normocephalic . Face symmetric, atraumatic Lungs:  CTA B Normal respiratory effort, no intercostal retractions, no accessory muscle use. Heart: Bradycardic.  Abdomen:  Not distended, soft, non-tender. No rebound or rigidity.   Lower extremities: Mild pretibial edema bilaterally  Skin: Exposed areas without rash. Not pale. Not jaundice Neurologic:  alert & oriented X3.  Speech normal, gait appropriate for age and unassisted Strength symmetric and appropriate for age.  Psych: Cognition and judgment appear intact.  Cooperative with normal attention span and concentration.  Behavior appropriate. No anxious or depressed appearing.     Assessment     Assessment  (Transfer from  Dr. Linna Darner [phone]) Hyperglycemia, A1c 6.2 2013 HTN Hyperlipidemia Hypothyroidism  Thyroid US 2014-- no nodules, inhomogeneous  CV: --CAD: stents 2006;  low risk nuclear scan 2011, 03-2016 --A Fib dx 09/2018 at regular crads OV, on Eliquis, EF --Carotid artery disease [place] 12/2014:   Stable, 1-39% RICA stenosis. Stable, 93-23% LICA stenosis. [place] 12/2015: Next 2 years R leg edema, [place] (-)   DVT 08-2014 and 01/2019 [facility] DJD: Dr Maureen Ralphs, back surgery Dr Arnoldo Morale 06-2016 Venous insufficiency, chronic R>L edema Tremor Glaucoma H/o skin cancer , denies Melanoma; sees derm, had a visit ~ 09/2017, Dr [place] Crane  PLAN: Here for CPX Hyperglycemia: Check A1c HTN: Currently on Lotensin, diltiazem, Lasix, metoprolol.  BP increased upon arrival, recheck: 122/74.  Rec to check BPs at home.  F/u  4 months.  Check labs. Hyperlipidemia: Controlled, on Lipitor, labs. Hypothyroidism: Good compliance with Synthroid 175 mcg, check a TSH. Cardiovascular: Carotid ultrasound August 2021: 1 to 39% bilaterally, stable compared to  2019.  Consider repeat in 2 years.. R leg ulcer: Saw dermatology a few months ago, he was prescribed a cream, area looks better according to the patient, likely related to venous insufficiency.  Recommend to call if the area does not continue improving DJD: Symptoms unchanged, continue with back pain. RTC 4 months    This visit occurred during the SARS-CoV-2 public health emergency.  Safety protocols were in place, including screening questions prior to the visit, additional usage of staff PPE, and extensive cleaning of exam room while observing appropriate contact time as indicated for disinfecting solutions.

## 2020-10-25 NOTE — Progress Notes (Signed)
Pre visit review using our clinic review tool, if applicable. No additional management support is needed unless otherwise documented below in the visit note. 

## 2020-10-25 NOTE — Patient Instructions (Signed)
Check the  blood pressure twice weekly. BP GOAL is between 110/65 and  135/85. If it is consistently higher or lower, let me know  Proceed with a COVID booster as soon as possible.  After that, okay to get shingles shot  GO TO THE LAB : Get the blood work     Chunky, [place] back for a checkup in 4 months   Advance Directive  Advance directives are legal documents that allow you to make decisions about your health care and medical treatment in case you become unable to communicate for yourself. Advance directives let your wishes be known to family, friends, and health care providers. Discussing and writing advance directives should happen over time rather than all at once. Advance directives can be changed and updated at any time. There are different types of advance directives, such as:  Medical power of attorney.  Living will.  Do not resuscitate (DNR) order or do not attempt resuscitation (DNAR) order. Health care proxy and medical power of attorney A health care proxy is also called a health care agent. This person is appointed to make medical decisions for you when you are unable to make decisions for yourself. Generally, people ask a trusted friend or family member to act as their proxy and represent their preferences. Make sure you have an agreement with your trusted person to act as your proxy. A proxy may have to make a medical decision on your behalf if your wishes are not known. A medical power of attorney, also called a durable power of attorney for health care, is a legal document that names your health care proxy. Depending on the laws in your state, the document may need to be:  Signed.  Notarized.  Dated.  Copied.  Witnessed.  Incorporated into your medical record. You may also want to appoint a trusted person to manage your money in the event you are unable to do so. This is called a durable power of attorney for  finances. It is a separate legal document from the durable power of attorney for health care. You may choose your health care proxy or someone different to act as your agent in money matters. If you do not appoint a proxy, or there is a concern that the proxy is not acting in your best interest, a court may appoint a guardian to act on your behalf. Living will A living will is a set of instructions that state your wishes about medical care when you cannot express them yourself. Health care providers should keep a copy of your living will in your medical record. You may want to give a copy to family members or friends. To alert caregivers in case of an emergency, you can place a card in your wallet to let them know that you have a living will and where they can find it. A living will is used if you become:  Terminally ill.  Disabled.  Unable to communicate or make decisions. The following decisions should be included in your living will:  To use or not to use life support equipment, such as dialysis machines and breathing machines (ventilators).  Whether you want a DNR or DNAR order. This tells health care providers not to use cardiopulmonary resuscitation (CPR) if breathing or heartbeat stops.  To use or not to use tube feeding.  To be given or not to be given food and fluids.  Whether you want comfort (palliative) care when  the goal becomes comfort rather than a cure.  Whether you want to donate your organs and tissues. A living will does not give instructions for distributing your money and property if you should pass away. DNR or DNAR A DNR or DNAR order is a request not to have CPR in the event that your heart stops beating or you stop breathing. If a DNR or DNAR order has not been made and shared, a health care provider will try to help any patient whose heart has stopped or who has stopped breathing. If you plan to have surgery, talk with your health care provider about how your DNR or  DNAR order will be followed if problems occur. What if I do not have an advance directive? Some states assign family decision makers to act on your behalf if you do not have an advance directive. Each state has its own laws about advance directives. You may want to check with your health care provider, attorney, or state representative about the laws in your state. Summary  Advance directives are legal documents that allow you to make decisions about your health care and medical treatment in case you become unable to communicate for yourself.  The process of discussing and writing advance directives should happen over time. You can change and update advance directives at any time.  Advance directives may include a medical power of attorney, a living will, and a DNR or DNAR order. This information is not intended to replace advice given to you by your health care provider. Make sure you discuss any questions you have with your health care provider. Document Revised: 06/11/2020 Document Reviewed: 06/11/2020 Elsevier Patient Education  2021 [employer].

## 2020-10-27 ENCOUNTER — Telehealth: Payer: Self-pay | Admitting: Internal Medicine

## 2020-10-27 ENCOUNTER — Encounter: Payer: Self-pay | Admitting: Internal Medicine

## 2020-10-27 DIAGNOSIS — E782 Mixed hyperlipidemia: Secondary | ICD-10-CM

## 2020-10-27 DIAGNOSIS — E039 Hypothyroidism, unspecified: Secondary | ICD-10-CM

## 2020-10-27 NOTE — Assessment & Plan Note (Addendum)
-  Td 2014 - pnm 2014, Prevnar 2016 - zostavax 2015 - shingrex shot recommended. -CoVID vaccines: Recommend a booster -Flu shot today -Prostate/colon ca screening: No further screenings -Labs: CMP, FLP, A1c, TSH -Diet, exercise discussed.  He takes walks indoors.  Encouraged to take walks outdoors when it is safe. -Advance  Directives discussed

## 2020-10-27 NOTE — Telephone Encounter (Signed)
TSH increased from 0.4-6.8.  Compliance? Cholesterol not at goal: Please call the patient:  Encouraged to take both levothyroxine 150 mcg AND levothyroxine 25 mcg every day.  Increase atorvastatin from half tablet daily to 1 tablet a day. Arrange for a TSH and FLP in 6 weeks.

## 2020-10-27 NOTE — Assessment & Plan Note (Signed)
Here for CPX Hyperglycemia: Check A1c HTN: Currently on Lotensin, diltiazem, Lasix, metoprolol.  BP increased upon arrival, recheck: 122/74.  Rec to check BPs at home.  F/u  4 months.  Check labs. Hyperlipidemia: Controlled, on Lipitor, labs. Hypothyroidism: Good compliance with Synthroid 175 mcg, check a TSH. Cardiovascular: Carotid ultrasound August 2021: 1 to 39% bilaterally, stable compared to 2019.  Consider repeat in 2 years.. R leg ulcer: Saw dermatology a few months ago, he was prescribed a cream, area looks better according to the patient, likely related to venous insufficiency.  Recommend to call if the area does not continue improving DJD: Symptoms unchanged, continue with back pain. RTC 4 months

## 2020-10-28 MED ORDER — ATORVASTATIN CALCIUM 40 MG PO TABS: 40.0000 mg | ORAL_TABLET | Freq: Every day | ORAL | 0 refills | Status: DC

## 2020-10-28 NOTE — Telephone Encounter (Signed)
Patient would like a phone call he has some question about the medication

## 2020-10-28 NOTE — Telephone Encounter (Signed)
Spoke w/ Pt and his wife- he thought we had changed his metoprolol dosage. Informed we are only changing the atorvastatin dosage. Pt and wife verbalized understanding.

## 2020-10-28 NOTE — Telephone Encounter (Signed)
Spoke w/ Pt- informed of results and recommendations. Pt verbalized understanding. Atorvastatin 40mg  1 tab daily sent to [facility]. Pt will go by [facility] lab in 6 weeks to recheck blood work. TSH and FLP ordered.

## 2020-11-08 DIAGNOSIS — L0100 Impetigo, unspecified: Secondary | ICD-10-CM | POA: Diagnosis not present

## 2020-11-08 NOTE — Progress Notes (Signed)
Chronic Care Management Pharmacy Note  11/15/2020 Name:  [name] MRN:  [id number] DOB:  [birth year]  Subjective: [name] is an 85 y.o. year old male who is a primary patient of Paz, Alda Berthold, MD.  The CCM team was consulted for assistance with disease management and care coordination needs.    Engaged with patient by telephone for follow up visit in response to provider referral for pharmacy case management and/or care coordination services.   Consent to Services:  The patient was given the following information about Chronic Care Management services today, agreed to services, and gave verbal consent: 1. CCM service includes personalized support from designated clinical staff supervised by the primary care provider, including individualized plan of care and coordination with other care providers 2. 24/7 contact phone numbers for assistance for urgent and routine care needs. 3. Service will only be billed when office clinical staff spend 20 minutes or more in a month to coordinate care. 4. Only one practitioner may furnish and bill the service in a calendar month. 5.The patient may stop CCM services at any time (effective at the end of the month) by phone call to the office staff. 6. The patient will be responsible for cost sharing (co-pay) of up to 20% of the service fee (after annual deductible is met). Patient agreed to services and consent obtained.  Patient Care Team: Colon Branch, MD as PCP - General (Internal Medicine) Josue Hector, MD as Consulting Physician (Cardiology) Allyn Kenner, MD (Dermatology) Gaynelle Arabian, MD as Consulting Physician (Orthopedic Surgery) Newman Pies, MD as Consulting Physician (Neurosurgery) Marygrace Drought, MD as Consulting Physician (Ophthalmology) Edythe Clarity, [facility] (Pharmacist)  Recent office visits: 10/25/20 Larose Kells) - Atorvastatin increased to full tablet of atorvastatin, TSH has increased, stressed compliance with both levothyroxine  133mcg and 47mcg  Recent consult visits: None recent  Hospital visits: None in previous 6 months  Objective:  Lab Results  Component Value Date   CREATININE 1.12 10/25/2020   BUN 17 10/25/2020   GFR 61.14 10/25/2020   GFRNONAA 61 04/27/2019   GFRAA 70 04/27/2019   NA 141 10/25/2020   K 4.6 10/25/2020   CALCIUM 9.9 10/25/2020   CO2 33 (H) 10/25/2020    Lab Results  Component Value Date/Time   HGBA1C 5.9 10/25/2020 10:55 AM   HGBA1C 6.2 10/11/2019 12:03 PM   GFR 61.14 10/25/2020 10:55 AM   GFR 64.73 06/06/2020 08:37 AM    Last diabetic Eye exam:  Lab Results  Component Value Date/Time   HMDIABEYEEXA No Retinopathy 07/10/2016 12:00 AM    Last diabetic Foot exam: No results found for: HMDIABFOOTEX   Lab Results  Component Value Date   CHOL 158 10/25/2020   HDL 49.60 10/25/2020   LDLCALC 91 10/25/2020   TRIG 84.0 10/25/2020   CHOLHDL 3 10/25/2020    Hepatic Function Latest Ref Rng & Units 10/25/2020 06/06/2020 02/07/2019  Total Protein 6.0 - 8.3 g/dL 6.7 7.0 7.0  Albumin 3.5 - 5.2 g/dL 4.2 4.3 4.4  AST 0 - 37 U/L $Remo'21 26 24  'RcTpe$ ALT 0 - 53 U/L $Remo'17 20 19  'ijfBJ$ Alk Phosphatase 39 - 117 U/L 68 74 68  Total Bilirubin 0.2 - 1.2 mg/dL 0.8 0.8 0.8  Bilirubin, Direct 0.0 - 0.3 mg/dL - - -    Lab Results  Component Value Date/Time   TSH 6.80 (H) 10/25/2020 10:55 AM   TSH 0.44 06/06/2020 08:37 AM    CBC Latest Ref Rng & Units  06/06/2020 02/07/2019 10/06/2018  WBC 4.0 - 10.5 K/uL 6.9 5.8 6.7  Hemoglobin 13.0 - 17.0 g/dL 16.1 14.9 15.9  Hematocrit 39.0 - 52.0 % 48.5 44.3 45.9  Platelets 150.0 - 400.0 K/uL 202.0 213.0 240    No results found for: VD25OH  Clinical ASCVD: Yes  The ASCVD Risk score Mikey Bussing DC Jr., et al., 2013) failed to calculate for the following reasons:   The 2013 ASCVD risk score is only valid for ages 36 to 23    Depression screen PHQ 2/9 10/25/2020 04/05/2020 08/14/2019  Decreased Interest 0 0 0  Down, Depressed, Hopeless 0 0 0  PHQ - 2 Score 0 0 0       Social History   Tobacco Use  Smoking Status Former Smoker  . Types: Cigarettes  . Quit date: 09/21/1984  . Years since quitting: 36.1  Smokeless Tobacco Never Used  Tobacco Comment   smoked 1958-1986, up to 1 ppd   BP Readings from Last 3 Encounters:  10/25/20 122/74  04/09/20 (!) 142/80  12/14/19 128/82   Pulse Readings from Last 3 Encounters:  10/25/20 (!) 55  04/09/20 66  12/14/19 61   Wt Readings from Last 3 Encounters:  10/25/20 196 lb 6 oz (89.1 kg)  04/09/20 189 lb 4 oz (85.8 kg)  04/05/20 185 lb (83.9 kg)    Assessment/Interventions: Review of patient past medical history, allergies, medications, health status, including review of consultants reports, laboratory and other test data, was performed as part of comprehensive evaluation and provision of chronic care management services.   SDOH:  (Social Determinants of Health) assessments and interventions performed: No   CCM Care Plan  Allergies  Allergen Reactions  . Amlodipine Besylate Other (See Comments)    REACTION: edema    Medications Reviewed Today    Reviewed by Edythe Clarity, [facility] (Pharmacist) on 11/15/20 at 1405  Med List Status: <None>  Medication Order Taking? Sig Documenting Provider Last Dose Status Informant  aspirin 81 MG tablet [id number] Yes Take 1 tablet (81 mg total) by mouth daily. Josue Hector, MD Taking Active   atorvastatin (LIPITOR) 40 MG tablet [id number] Yes Take 1 tablet (40 mg total) by mouth at bedtime. Colon Branch, MD Taking Active   benazepril (LOTENSIN) 20 MG tablet [id number] Yes Take 1.5 tablets (30 mg total) by mouth daily. Colon Branch, MD Taking Active   clobetasol cream (TEMOVATE) 0.05 % [id number] Yes Apply 1 application topically 2 (two) times daily as needed. [provider] Taking Active   diltiazem (DILT-XR) 180 MG 24 hr capsule [id number] Yes Take 1 capsule (180 mg total) by mouth daily. Colon Branch, MD Taking Active   ELIQUIS 5 MG TABS tablet [id number]  Yes TAKE 1 TABLET BY MOUTH TWICE DAILY Josue Hector, MD Taking Active   furosemide (LASIX) 40 MG tablet [id number] Yes TAKE 1 AND 1/2 TABLETS(60 MG) BY MOUTH TWICE DAILY Josue Hector, MD Taking Active   levothyroxine (SYNTHROID) 150 MCG tablet [id number] Yes TAKE 1 TABLET BY MOUTH DAILY BEFORE BREAKFAST. TAKE WITH 25MCG Colon Branch, MD Taking Active   levothyroxine (SYNTHROID) 25 MCG tablet [id number] Yes TAKE 1 TABLET BY MOUTH DAILY BEFORE BREAKFAST ALONG WITH A 150 MCG TABLET DAILY Colon Branch, MD Taking Active   LUMIGAN 0.01 % SOLN [id number] Yes Place 1 drop into both eyes 2 (two) times daily.  [provider] Taking Active Self           Med Note (  CANTER, Vilma Prader D   Mon May 04, 2016  1:32 PM)    metoprolol tartrate (LOPRESSOR) 25 MG tablet [id number] Yes Take 0.5 tablets (12.5 mg total) by mouth 2 (two) times daily. Colon Branch, MD Taking Active   nitroGLYCERIN (NITROSTAT) 0.4 MG SL tablet [id number] Yes PLACE 1 TABLET UNDER THE TONGUE EVERY 5 MINUTES AS NEEDED FOR CHEST PAIN Josue Hector, MD Taking Active   Timolol Maleate 0.5 % (DAILY) SOLN [id number] Yes Apply 1 drop to eye daily. [provider] Taking Active           Patient Active Problem List   Diagnosis Date Noted  . Atrial fibrillation ([place]) 02/08/2019  . Lumbar stenosis with neurogenic claudication 07/13/2016  . Annual physical exam>>>>>>>>>>>>>>>>>> 05/04/2016  . PCP NOTES>>>>>>>>>>>>>>>>>>>>>>>>>>> 12/31/2015  . Edema 02/19/2015  . OA (osteoarthritis) of knee 06/25/2014  . Unspecified sleep apnea 06/18/2014  . Multiple thyroid nodules   . Tremor   . Carotid artery disease ([place])   . Erectile dysfunction 05/31/2012  . Coronary artery disease   . Venous insufficiency   . Hyperglycemia 11/09/2008  . MELANOMA, HX OF 11/09/2008  . COLONIC POLYPS, HYPERPLASTIC 05/10/2008  . Hypothyroidism 08/10/2007  . HYPERLIPIDEMIA 08/10/2007  . Essential hypertension 08/10/2007  . DJD (degenerative joint disease)  08/08/2007  . MURMUR 08/08/2007  . BENIGN PROSTATIC HYPERTROPHY, HX OF 08/05/2007    Immunization History  Administered Date(s) Administered  . Fluad Quad(high Dose 65+) 08/04/2019, 10/25/2020  . Influenza Split 08/12/2011, 09/06/2012  . Influenza Whole 07/23/2006, 08/10/2007, 06/27/2008, 08/21/2009, 08/19/2010  . Influenza, High Dose Seasonal PF 07/20/2013, 09/19/2015, 09/07/2018  . Influenza,inj,Quad PF,6+ Mos 07/14/2016  . Influenza-Unspecified 06/21/2014, 10/01/2017  . PFIZER(Purple Top)SARS-COV-2 Vaccination 11/23/2019, 12/19/2019  . Pneumococcal Conjugate-13 02/19/2015  . Pneumococcal Polysaccharide-23 09/01/2013  . Td 09/23/1998  . Tdap 09/01/2013  . Zoster 09/29/2013    Conditions to be addressed/monitored:  Hypertension, Hyperlipidemia/CAD, Pre-Diabetes AFib, Hypothyroidism, Edema  Care Plan : General Pharmacy (Adult)  Updates made by Edythe Clarity, RPH since 11/15/2020 12:00 AM    Problem: HTN, HLD, Hypothyroidism, Afib   Priority: High  Onset Date: 11/15/2020    Long-Range Goal: Patient-Specific Goal   Start Date: 11/15/2020  Expected End Date: 11/15/2020  This Visit's Progress: On track  Priority: High  Note:   Current Barriers:  . Unable to achieve control of TSH recently.  Pharmacist Clinical Goal(s):  Marland Kitchen Over the next 120 days, patient will achieve adherence to monitoring guidelines and medication adherence to achieve therapeutic efficacy . achieve control of TSH as evidenced by upcoming lab . adhere to prescribed medication regimen as evidenced by fill dates . contact provider office for questions/concerns as evidenced notation of same in electronic health record through collaboration with PharmD and provider.    Interventions: . 1:1 collaboration with Colon Branch, MD regarding development and update of comprehensive plan of care as evidenced by provider attestation and co-signature . Inter-disciplinary care team collaboration (see longitudinal plan of  care) . Comprehensive medication review performed; medication list updated in electronic medical record  Hypertension (BP goal <140/90) -controlled -Current treatment:  Benazepril $RemoveBef'20mg'YRDsnnRfPy$  1.5 daily  Diltiazem XR $RemoveBefo'180mg'herBvotDeOw$  daily  Metoprolol Tartrate 1/2 tab twice daily -Medications previously tried: spironolactone -Current home readings: 114/68, 114/71, 110/54, 111/69, 114/67 -Denies hypotensive/hypertensive symptoms -Educated on BP goals and benefits of medications for prevention of heart attack, stroke and kidney damage; Importance of home blood pressure monitoring; -Counseled to monitor BP at home daily, document, and provide log at  future appointments -Recommended to continue current medication  Hyperlipidemia/CAD: (LDL goal < 70) -controlled -Current treatment:  Atorvastatin 40mg    Aspirin 81mg  daily  Nitroglycerin 0.4mg  as needed -Medications previously tried: none noted  -LDL above goal of 70, has lipid panel schedule for 6 weeks after he increased dose of atorvastatin -Educated on Cholesterol goals;  Benefits of statin for ASCVD risk reduction; -Recommended to continue current medication, reminded of upcoming lab appt.  Pre-Diabetes (A1c goal <6.5%) -controlled -Current medications: . None -Medications previously tried: none noted  -Educated onA1c and blood sugar goals; -Counseled to check feet daily and get yearly eye exams -Recommended he continue current lifestyle managment  Atrial Fibrillation (Goal: prevent stroke and major bleeding) -controlled  -Current treatment:  Rate control: Diltiazem XR 180mg  daily  Metoprolol Tartrate 1/2 tab twice daily o Anticoagulation: Eliquis 5mg  bid -Medications previously tried: none noted -Home BP and HR readings: BP above, HR not reported  -Counseled on importance of adherence to anticoagulant exactly as prescribed; avoidance of NSAIDs due to increased bleeding risk with anticoagulants; -Recommended to continue current  medication  Hypothyroidism (Goal: [place] TSH WNL) -uncontrolled -Current treatment   Levothyroxine 183mcg daily  Levothyroxine 57mcg daily -Medications previously tried: none noted -TSH slightly elevated at last visit  -Reports 100% compliance with med -Educated on  proper timing of medication 30-60 minutes before meals or other meds on empty stomach, patient verbalized understanding -Follow up on TSH for med dosing changes pending result   Patient Goals/Self-Care Activities . Over the next 120 days, patient will:  - take medications as prescribed check blood pressure daily, document, and provide at future appointments Report to PCP for updated labs as directed  Follow Up Plan: The care management team will reach out to the patient again over the next 120 days.         Medication Assistance: None required.  Patient affirms current coverage meets needs.  Patient's preferred pharmacy is:  [facility] [phone] [address], [place] [facility] [address] AT [place] [address] [place] [address] Phone: [phone] Fax: [phone]  Uses pill box? No - vials Pt endorses 100% compliance  We discussed: Benefits of medication synchronization, packaging and delivery as well as enhanced pharmacist oversight with [employer]. Patient decided to: Continue current medication management strategy  Care Plan and Follow Up Patient Decision:  Patient agrees to Care Plan and Follow-up.  Plan: The care management team will reach out to the patient again over the next 120 days.  Beverly Milch, PharmD Clinical Pharmacist [place] [phone]

## 2020-11-13 NOTE — Progress Notes (Signed)
Date:  11/18/2020   ID:  [name], DOB 21-Oct-1935, MRN [id number]   PCP:  Colon Branch, MD  Cardiologist:   Johnsie Cancel Electrophysiologist:  None   Evaluation Performed:  Follow-Up Visit  Chief Complaint:  CAD/AFib  History of Present Illness:    [name] is a 85 y.o.  male with a history of CAD s/p DES to LCx (2006), carotid artery disease, HTN, HLD who presents to clinic for follow up. No angina compliant with meds Normal nuclear study 2017 prior to back surgery. LE Edema from varicosities and previous right TKR takes lasix for this. Dr Larose Kells did venous duplex for swelling 02/07/19 and no DVT  Seen by Dr Donzetta Matters [facility] 05/19/19 and felt saphenous veins too small for intervention and would Rx conservatively with elevation, compression stockings and diuretics  10/20/18  found to be in new onset afib. He did not notice. No palpitations, mild exertional dyspnea chronic No chest pain.   Started on Eliquis CHA2DS2-Vasc is 4   He is asymptomatic Tolerating blood thinner   TTE done 10/13/18 reviewed EF normal 55-60% severe LAE  ECG today in sinus rhythm   Had injections for back with Dr Arnoldo Morale good relief of pain   Received COVID vaccine 11/23/19   No cardiac complaints Discussed wearing compression stocking on right leg   Past Medical History:  Diagnosis Date  . Arthritis   . BPH (benign prostatic hypertrophy)   . Carotid artery disease ([place])    Doppler, December 08, 2011, 00 06% R. ICA, 26-94% LICA, followup 1 year  . Coronary artery disease    DES Circumflex or CVA 2006  /  clear, February, 2011, EF 70%, no ischemia or  . Ejection fraction    EF 60%, echo, 2009, mildly calcified aortic leaflets  . History of colonic polyps   . Hyperlipidemia   . Hypertension   . Hypothyroidism   . Lumbar radiculopathy   . Multiple thyroid nodules    Avascular echogenic areas noted in the right thyroid at the time of carotid Doppler.   Marland Kitchen PONV (postoperative nausea and vomiting)     "nausea with first hip surgery"  . Skin cancer (melanoma) (HCC)    PMH of   . Spinal stenosis, lumbar   . Tremor    Fine tremor right upper extremity  . Venous insufficiency    Past Surgical History:  Procedure Laterality Date  . COLONOSCOPY    . CORONARY ANGIOPLASTY WITH STENT PLACEMENT  2006   x 2 stents; DES to CX and dRCA '06  . KNEE SURGERY  1970's   "chipped bone"  . LUMBAR LAMINECTOMY/DECOMPRESSION MICRODISCECTOMY Left 07/13/2016   Procedure: LUMBAR LAMINECTOMY AND FORAMINOTOMY Lumbar two, three, Lumbar three-four, Lumbar four-five, LEFT Lumbar five-Sacral one DISECTOMY;  Surgeon: Newman Pies, MD;  Location: [facility];  Service: Neurosurgery;  Laterality: Left;  . TOE SURGERY  1997  . TONSILLECTOMY    . TOTAL HIP ARTHROPLASTY Left 2007  . TOTAL HIP ARTHROPLASTY Right 2010  . TOTAL KNEE ARTHROPLASTY Right 06/25/2014   Procedure: RIGHT TOTAL KNEE ARTHROPLASTY;  Surgeon: Gearlean Alf, MD;  Location: [facility];  Service: Orthopedics;  Laterality: Right;     Current Meds  Medication Sig  . aspirin 81 MG tablet Take 1 tablet (81 mg total) by mouth daily.  Marland Kitchen atorvastatin (LIPITOR) 40 MG tablet Take 1 tablet (40 mg total) by mouth at bedtime.  . benazepril (LOTENSIN) 20 MG tablet Take 1.5  tablets (30 mg total) by mouth daily.  . clobetasol cream (TEMOVATE) 0.93 % Apply 1 application topically 2 (two) times daily as needed.  . diltiazem (DILT-XR) 180 MG 24 hr capsule Take 1 capsule (180 mg total) by mouth daily.  Marland Kitchen ELIQUIS 5 MG TABS tablet TAKE 1 TABLET BY MOUTH TWICE DAILY  . furosemide (LASIX) 40 MG tablet TAKE 1 AND 1/2 TABLETS(60 MG) BY MOUTH TWICE DAILY  . levothyroxine (SYNTHROID) 150 MCG tablet TAKE 1 TABLET BY MOUTH DAILY BEFORE BREAKFAST. TAKE WITH 25MCG  . levothyroxine (SYNTHROID) 25 MCG tablet TAKE 1 TABLET BY MOUTH DAILY BEFORE BREAKFAST ALONG WITH A 150 MCG TABLET DAILY  . LUMIGAN 0.01 % SOLN Place 1 drop into both eyes 2 (two) times daily.   . metoprolol tartrate  (LOPRESSOR) 25 MG tablet Take 0.5 tablets (12.5 mg total) by mouth 2 (two) times daily.  . nitroGLYCERIN (NITROSTAT) 0.4 MG SL tablet PLACE 1 TABLET UNDER THE TONGUE EVERY 5 MINUTES AS NEEDED FOR CHEST PAIN  . Timolol Maleate 0.5 % (DAILY) SOLN Apply 1 drop to eye daily.     Allergies:   Amlodipine besylate   Social History   Tobacco Use  . Smoking status: Former Smoker    Types: Cigarettes    Quit date: 09/21/1984    Years since quitting: 36.1  . Smokeless tobacco: Never Used  . Tobacco comment: smoked 1958-1986, up to 1 ppd  Vaping Use  . Vaping Use: Never used  Substance Use Topics  . Alcohol use: Yes    Comment: socially on occasion  . Drug use: No     Family Hx: The patient's family history includes Colon cancer (age of onset: 58) in his father; Coronary artery disease in his mother; Diabetes in his maternal grandfather and sister; Heart attack (age of onset: 16) in his sister; Heart attack (age of onset: 2) in his maternal grandfather; Hypertension in his father; Pancreatitis in his father. There is no history of Thyroid disease, Stroke, or Prostate cancer.  ROS:   Please see the history of present illness.     All other systems reviewed and are negative.   Prior CV studies:   The following studies were reviewed today:  Venous Duplex 02/07/19 TTE 10/13/18 Carotid 05/07/20    Labs/Other Tests and Data Reviewed:    EKG:   SR rate 61 nonspecific ST changes 12/14/19 11/18/2020 NSR rate 55 nonspecific ST changes   Recent Labs: 06/06/2020: Hemoglobin 16.1; Platelets 202.0 10/25/2020: ALT 17; BUN 17; Creatinine, Ser 1.12; Potassium 4.6; Sodium 141; TSH 6.80   Recent Lipid Panel Lab Results  Component Value Date/Time   CHOL 158 10/25/2020 10:55 AM   CHOL 161 10/31/2014 08:24 AM   TRIG 84.0 10/25/2020 10:55 AM   TRIG 81 10/31/2014 08:24 AM   TRIG 86 07/23/2006 10:12 AM   HDL 49.60 10/25/2020 10:55 AM   HDL 49 10/31/2014 08:24 AM   CHOLHDL 3 10/25/2020 10:55 AM    LDLCALC 91 10/25/2020 10:55 AM   LDLCALC 96 10/31/2014 08:24 AM    Wt Readings from Last 3 Encounters:  11/18/20 88.3 kg  10/25/20 89.1 kg  04/09/20 85.8 kg     Objective:    Vital Signs:  BP (!) 142/64   Pulse (!) 54   Ht 5\' 9"  (1.753 m)   Wt 88.3 kg   BMI 28.74 kg/m    Affect appropriate Healthy:  appears stated age HEENT: normal Neck supple with no adenopathy JVP normal no bruits no  thyromegaly Lungs clear with no wheezing and good diaphragmatic motion Heart:  S1/S2 no murmur, no rub, gallop or click PMI normal Abdomen: benighn, BS positve, no tenderness, no AAA no bruit.  No HSM or HJR Distal pulses intact with no bruits Plus 2 RLE edema plus 1 LLE with varicosities Neuro non-focal Skin warm and dry Post right TKR     ASSESSMENT & PLAN:    LE edema: venous reflux f/u Dr Donzetta Matters better with higher dose lasix discussed elevation, compression stocking And low sodium diet   CAD: DES to circumflex 2006  stable with low risk Myoview 03/31/16 . Continue ASA, statin and BB  Carotid artery disease: stable doppler study in 05/07/20  Plaque no significant stenosis . Repeat due in 2 years ( 04/2022  )  HLD: continue statin. Followed by Dr. Larose Kells LDL 79-91 mg/dl  Hypothyroidism: continue synthroid followed by Dr. Larose Kells TSH 6.8  Gilberto Better:  Post right TKR and lumbar surgery    Afib:  New diagnosis  09/28/18 On Eliquis 5 bid for CHADVASC 4.  Continue cardizem in NSR today   Medication Adjustments/Labs and Tests Ordered: Current medicines are reviewed at length with the patient today.  Concerns regarding medicines are outlined above.   Tests Ordered:  Carotid Duplex August 2023   Medication Changes:  None   Disposition:  Follow up in a year   Signed, Jenkins Rouge, MD  11/18/2020 11:14 AM    [place]

## 2020-11-15 ENCOUNTER — Ambulatory Visit (INDEPENDENT_AMBULATORY_CARE_PROVIDER_SITE_OTHER): Payer: Medicare Other

## 2020-11-15 ENCOUNTER — Telehealth: Payer: Medicare Other

## 2020-11-15 DIAGNOSIS — E039 Hypothyroidism, unspecified: Secondary | ICD-10-CM

## 2020-11-15 DIAGNOSIS — I482 Chronic atrial fibrillation, unspecified: Secondary | ICD-10-CM | POA: Diagnosis not present

## 2020-11-15 DIAGNOSIS — E782 Mixed hyperlipidemia: Secondary | ICD-10-CM

## 2020-11-15 DIAGNOSIS — I10 Essential (primary) hypertension: Secondary | ICD-10-CM

## 2020-11-15 NOTE — Patient Instructions (Addendum)
Visit Information  Goals Addressed            This Visit's Progress   . Manage My Medicine       Timeframe:  Long-Range Goal Priority:  High Start Date: 11/15/20                            Expected End Date: 05/15/21                      Follow Up Date 02/18/21    - Take thyroid medication 30-60 minutes before food or other meds.   -Remember to report to PCP for updated labwork.    Why is this important?   . These steps will help you keep on track with your medicines.   Notes:       Patient Care Plan: General Pharmacy (Adult)    Problem Identified: HTN, HLD, Hypothyroidism, Afib   Priority: High  Onset Date: 11/15/2020    Long-Range Goal: Patient-Specific Goal   Start Date: 11/15/2020  Expected End Date: 11/15/2020  This Visit's Progress: On track  Priority: High  Note:   Current Barriers:  . Unable to achieve control of TSH recently.  Pharmacist Clinical Goal(s):  Marland Kitchen Over the next 120 days, patient will achieve adherence to monitoring guidelines and medication adherence to achieve therapeutic efficacy . achieve control of TSH as evidenced by upcoming lab . adhere to prescribed medication regimen as evidenced by fill dates . contact provider office for questions/concerns as evidenced notation of same in electronic health record through collaboration with PharmD and provider.    Interventions: . 1:1 collaboration with Colon Branch, MD regarding development and update of comprehensive plan of care as evidenced by provider attestation and co-signature . Inter-disciplinary care team collaboration (see longitudinal plan of care) . Comprehensive medication review performed; medication list updated in electronic medical record  Hypertension (BP goal <140/90) -controlled -Current treatment:  Benazepril 20mg  1.5 daily  Diltiazem XR 180mg  daily  Metoprolol Tartrate 1/2 tab twice daily -Medications previously tried: spironolactone -Current home readings: 114/68, 114/71,  110/54, 111/69, 114/67 -Denies hypotensive/hypertensive symptoms -Educated on BP goals and benefits of medications for prevention of heart attack, stroke and kidney damage; Importance of home blood pressure monitoring; -Counseled to monitor BP at home daily, document, and provide log at future appointments -Recommended to continue current medication  Hyperlipidemia/CAD: (LDL goal < 70) -controlled -Current treatment:  Atorvastatin 40mg    Aspirin 81mg  daily  Nitroglycerin 0.4mg  as needed -Medications previously tried: none noted  -LDL above goal of 70, has lipid panel schedule for 6 weeks after he increased dose of atorvastatin -Educated on Cholesterol goals;  Benefits of statin for ASCVD risk reduction; -Recommended to continue current medication, reminded of upcoming lab appt.  Pre-Diabetes (A1c goal <6.5%) -controlled -Current medications: . None -Medications previously tried: none noted  -Educated onA1c and blood sugar goals; -Counseled to check feet daily and get yearly eye exams -Recommended he continue current lifestyle managment  Atrial Fibrillation (Goal: prevent stroke and major bleeding) -controlled  -Current treatment:  Rate control: Diltiazem XR 180mg  daily  Metoprolol Tartrate 1/2 tab twice daily o Anticoagulation: Eliquis 5mg  bid -Medications previously tried: none noted -Home BP and HR readings: BP above, HR not reported  -Counseled on importance of adherence to anticoagulant exactly as prescribed; avoidance of NSAIDs due to increased bleeding risk with anticoagulants; -Recommended to continue current medication  Hypothyroidism (Goal: [place] TSH WNL) -  uncontrolled -Current treatment   Levothyroxine 165mcg daily  Levothyroxine 70mcg daily -Medications previously tried: none noted -TSH slightly elevated at last visit  -Reports 100% compliance with med -Educated on  proper timing of medication 30-60 minutes before meals or other meds on empty  stomach, patient verbalized understanding -Follow up on TSH for med dosing changes pending result   Patient Goals/Self-Care Activities . Over the next 120 days, patient will:  - take medications as prescribed check blood pressure daily, document, and provide at future appointments Report to PCP for updated labs as directed  Follow Up Plan: The care management team will reach out to the patient again over the next 120 days.         The patient verbalized understanding of instructions, educational materials, and care plan provided today and agreed to receive a mailed copy of patient instructions, educational materials, and care plan.  Telephone follow up appointment with pharmacy team member scheduled for: 4 months  Edythe Clarity, [facility]  Managing Your Hypertension Hypertension, also called high blood pressure, is when the force of the blood pressing against the walls of the arteries is too strong. Arteries are blood vessels that carry blood from your heart throughout your body. Hypertension forces the heart to work harder to pump blood and may cause the arteries to become narrow or stiff. Understanding blood pressure readings Your personal target blood pressure may vary depending on your medical conditions, your age, and other factors. A blood pressure reading includes a higher number over a lower number. Ideally, your blood pressure should be below 120/80. You should know that:  The first, or top, number is called the systolic pressure. It is a measure of the pressure in your arteries as your heart beats.  The second, or bottom number, is called the diastolic pressure. It is a measure of the pressure in your arteries as the heart relaxes. Blood pressure is classified into four stages. Based on your blood pressure reading, your health care provider may use the following stages to determine what type of treatment you need, if any. Systolic pressure and diastolic pressure are measured in a  unit called mmHg. Normal  Systolic pressure: below 902.  Diastolic pressure: below 80. Elevated  Systolic pressure: 409-735.  Diastolic pressure: below 80. Hypertension stage 1  Systolic pressure: 329-924.  Diastolic pressure: 26-83. Hypertension stage 2  Systolic pressure: 419 or above.  Diastolic pressure: 90 or above. How can this condition affect me? Managing your hypertension is an important responsibility. Over time, hypertension can damage the arteries and decrease blood flow to important parts of the body, including the brain, heart, and kidneys. Having untreated or uncontrolled hypertension can lead to:  A heart attack.  A stroke.  A weakened blood vessel (aneurysm).  Heart failure.  Kidney damage.  Eye damage.  Metabolic syndrome.  Memory and concentration problems.  Vascular dementia. What actions can I take to manage this condition? Hypertension can be managed by making lifestyle changes and possibly by taking medicines. Your health care provider will help you make a plan to bring your blood pressure within a normal range. Nutrition  Eat a diet that is high in fiber and potassium, and low in salt (sodium), added sugar, and fat. An example eating plan is called the Dietary Approaches to Stop Hypertension (DASH) diet. To eat this way: ? Eat plenty of fresh fruits and vegetables. Try to fill one-half of your plate at each meal with fruits and vegetables. ? Eat whole  grains, such as whole-wheat pasta, brown rice, or whole-grain bread. Fill about one-fourth of your plate with whole grains. ? Eat low-fat dairy products. ? Avoid fatty cuts of meat, processed or cured meats, and poultry with skin. Fill about one-fourth of your plate with lean proteins such as fish, chicken without skin, beans, eggs, and tofu. ? Avoid pre-made and processed foods. These tend to be higher in sodium, added sugar, and fat.  Reduce your daily sodium intake. Most people with  hypertension should eat less than 1,500 mg of sodium a day.   Lifestyle  Work with your health care provider to maintain a healthy body weight or to lose weight. Ask what an ideal weight is for you.  Get at least 30 minutes of exercise that causes your heart to beat faster (aerobic exercise) most days of the week. Activities may include walking, swimming, or biking.  Include exercise to strengthen your muscles (resistance exercise), such as weight lifting, as part of your weekly exercise routine. Try to do these types of exercises for 30 minutes at least 3 days a week.  Do not use any products that contain nicotine or tobacco, such as cigarettes, e-cigarettes, and chewing tobacco. If you need help quitting, ask your health care provider.  Control any long-term (chronic) conditions you have, such as high cholesterol or diabetes.  Identify your sources of stress and find ways to manage stress. This may include meditation, deep breathing, or making time for fun activities.   Alcohol use  Do not drink alcohol if: ? Your health care provider tells you not to drink. ? You are pregnant, may be pregnant, or are planning to become pregnant.  If you drink alcohol: ? Limit how much you use to:  0-1 drink a day for women.  0-2 drinks a day for men. ? Be aware of how much alcohol is in your drink. In the [place] one drink equals one 12 oz bottle of beer (355 mL), one 5 oz glass of wine (148 mL), or one 1 oz glass of hard liquor (44 mL). Medicines Your health care provider may prescribe medicine if lifestyle changes are not enough to get your blood pressure under control and if:  Your systolic blood pressure is 130 or higher.  Your diastolic blood pressure is 80 or higher. Take medicines only as told by your health care provider. Follow the directions carefully. Blood pressure medicines must be taken as told by your health care provider. The medicine does not work as well when you skip doses.  Skipping doses also puts you at risk for problems. Monitoring Before you monitor your blood pressure:  Do not smoke, drink caffeinated beverages, or exercise within 30 minutes before taking a measurement.  Use the bathroom and empty your bladder (urinate).  Sit quietly for at least 5 minutes before taking measurements. Monitor your blood pressure at home as told by your health care provider. To do this:  Sit with your back straight and supported.  Place your feet flat on the floor. Do not cross your legs.  Support your arm on a flat surface, such as a table. Make sure your upper arm is at heart level.  Each time you measure, take two or three readings one minute apart and record the results. You may also need to have your blood pressure checked regularly by your health care provider.   General information  Talk with your health care provider about your diet, exercise habits, and other lifestyle factors that  may be contributing to hypertension.  Review all the medicines you take with your health care provider because there may be side effects or interactions.  Keep all visits as told by your health care provider. Your health care provider can help you create and adjust your plan for managing your high blood pressure. Where to find more information  National Heart, Lung, and Blood Institute: https://wilson-eaton.com/  American Heart Association: www.heart.org Contact a health care provider if:  You think you are having a reaction to medicines you have taken.  You have repeated (recurrent) headaches.  You feel dizzy.  You have swelling in your ankles.  You have trouble with your vision. Get help right away if:  You develop a severe headache or confusion.  You have unusual weakness or numbness, or you feel faint.  You have severe pain in your chest or abdomen.  You vomit repeatedly.  You have trouble breathing. These symptoms may represent a serious problem that is an  emergency. Do not wait to see if the symptoms will go away. Get medical help right away. Call your local emergency services (911 in the [place] Do not drive yourself to the hospital. Summary  Hypertension is when the force of blood pumping through your arteries is too strong. If this condition is not controlled, it may put you at risk for serious complications.  Your personal target blood pressure may vary depending on your medical conditions, your age, and other factors. For most people, a normal blood pressure is less than 120/80.  Hypertension is managed by lifestyle changes, medicines, or both.  Lifestyle changes to help manage hypertension include losing weight, eating a healthy, low-sodium diet, exercising more, stopping smoking, and limiting alcohol. This information is not intended to replace advice given to you by your health care provider. Make sure you discuss any questions you have with your health care provider. Document Revised: 10/13/2019 Document Reviewed: 08/08/2019 Elsevier Patient Education  2021 [employer].

## 2020-11-18 ENCOUNTER — Encounter: Payer: Self-pay | Admitting: Cardiovascular Disease

## 2020-11-18 ENCOUNTER — Ambulatory Visit: Payer: Medicare Other | Admitting: Cardiovascular Disease

## 2020-11-18 ENCOUNTER — Other Ambulatory Visit: Payer: Self-pay

## 2020-11-18 VITALS — BP 142/64 | HR 54 | Ht 69.0 in | Wt 194.6 lb

## 2020-11-18 DIAGNOSIS — I480 Paroxysmal atrial fibrillation: Secondary | ICD-10-CM | POA: Diagnosis not present

## 2020-11-18 DIAGNOSIS — I2510 Atherosclerotic heart disease of native coronary artery without angina pectoris: Secondary | ICD-10-CM

## 2020-11-18 DIAGNOSIS — R609 Edema, unspecified: Secondary | ICD-10-CM

## 2020-11-18 NOTE — Patient Instructions (Signed)

## 2020-11-25 ENCOUNTER — Other Ambulatory Visit: Payer: Self-pay | Admitting: Internal Medicine

## 2020-12-06 ENCOUNTER — Telehealth: Payer: Self-pay | Admitting: Internal Medicine

## 2020-12-06 ENCOUNTER — Other Ambulatory Visit: Payer: Self-pay | Admitting: Internal Medicine

## 2020-12-06 DIAGNOSIS — I872 Venous insufficiency (chronic) (peripheral): Secondary | ICD-10-CM | POA: Diagnosis not present

## 2020-12-06 DIAGNOSIS — Z1283 Encounter for screening for malignant neoplasm of skin: Secondary | ICD-10-CM | POA: Diagnosis not present

## 2020-12-06 DIAGNOSIS — D485 Neoplasm of uncertain behavior of skin: Secondary | ICD-10-CM | POA: Diagnosis not present

## 2020-12-06 DIAGNOSIS — Z08 Encounter for follow-up examination after completed treatment for malignant neoplasm: Secondary | ICD-10-CM | POA: Diagnosis not present

## 2020-12-06 DIAGNOSIS — Z85820 Personal history of malignant melanoma of skin: Secondary | ICD-10-CM | POA: Diagnosis not present

## 2020-12-06 DIAGNOSIS — D225 Melanocytic nevi of trunk: Secondary | ICD-10-CM | POA: Diagnosis not present

## 2020-12-06 NOTE — Telephone Encounter (Signed)
Medication: levothyroxine (SYNTHROID) 25 MCG tablet [[id number]]       Has the patient contacted their pharmacy?  (If no, request that the patient contact the pharmacy for the refill.) (If yes, when and what did the pharmacy advise?)     Preferred Pharmacy (with phone number or street name):  [facility], [place] - [address]  [place], [place] [address]  Phone:  [phone] Fax:  [phone]     Agent: Please be advised that RX refills may take up to 3 business days. We ask that you follow-up with your pharmacy.

## 2020-12-06 NOTE — Telephone Encounter (Signed)
RX sent in. -JMA

## 2020-12-12 ENCOUNTER — Other Ambulatory Visit (INDEPENDENT_AMBULATORY_CARE_PROVIDER_SITE_OTHER): Payer: Medicare Other

## 2020-12-12 DIAGNOSIS — E039 Hypothyroidism, unspecified: Secondary | ICD-10-CM

## 2020-12-12 DIAGNOSIS — E782 Mixed hyperlipidemia: Secondary | ICD-10-CM | POA: Diagnosis not present

## 2020-12-12 LAB — TSH: TSH: 3.86 u[IU]/mL (ref 0.35–4.50)

## 2020-12-12 LAB — LIPID PANEL
Cholesterol: 148 mg/dL (ref 0–200)
HDL: 48.7 mg/dL (ref >39.00)
LDL Cholesterol: 82 mg/dL (ref 0–99)
NonHDL: 99.37
Total CHOL/HDL Ratio: 3
Triglycerides: 86 mg/dL (ref 0.0–149.0)
VLDL: 17.2 mg/dL (ref 0.0–40.0)

## 2020-12-16 MED ORDER — ATORVASTATIN CALCIUM 40 MG PO TABS: 60.0000 mg | ORAL_TABLET | Freq: Every day | ORAL | 1 refills | Status: DC

## 2020-12-27 ENCOUNTER — Telehealth: Payer: Self-pay | Admitting: Internal Medicine

## 2020-12-27 NOTE — Telephone Encounter (Signed)
PA initiated via Covermymeds; KEY: [id number]. Awaiting determination.

## 2020-12-27 NOTE — Telephone Encounter (Signed)
Caller Gugliotta,Hope Caller # [phone]  Pt would like an Authorization be giving for this prescription [phone]      Medication:atorvastatin (LIPITOR) 40 MG tablet [[id number]]      Has the patient contacted their pharmacy? NO (If no, request that the patient contact the pharmacy for the refill.) (If yes, when and what did the pharmacy advise?)    Preferred Pharmacy (with phone number or street name):   [facility], [place] - [address]  [place], [place] [address]  Phone:  [phone] Fax:  [phone]    Agent: Please be advised that RX refills may take up to 3 business days. We ask that you follow-up with your pharmacy.

## 2020-12-30 ENCOUNTER — Other Ambulatory Visit: Payer: Self-pay | Admitting: Internal Medicine

## 2020-12-30 NOTE — Telephone Encounter (Signed)
PA approved.   Request Reference Number: [id number]. ATORVASTATIN TAB 40MG  is approved through 09/20/2021. Your patient may now fill this prescription and it will be covered.

## 2021-01-02 ENCOUNTER — Other Ambulatory Visit: Payer: Self-pay | Admitting: Internal Medicine

## 2021-01-02 MED ORDER — LEVOTHYROXINE SODIUM 25 MCG PO TABS: ORAL_TABLET | ORAL | 0 refills | Status: DC

## 2021-01-10 DIAGNOSIS — X32XXXD Exposure to sunlight, subsequent encounter: Secondary | ICD-10-CM | POA: Diagnosis not present

## 2021-01-10 DIAGNOSIS — L570 Actinic keratosis: Secondary | ICD-10-CM | POA: Diagnosis not present

## 2021-01-10 DIAGNOSIS — I872 Venous insufficiency (chronic) (peripheral): Secondary | ICD-10-CM | POA: Diagnosis not present

## 2021-01-13 ENCOUNTER — Telehealth: Payer: Self-pay | Admitting: Internal Medicine

## 2021-01-13 ENCOUNTER — Other Ambulatory Visit: Payer: Self-pay

## 2021-01-13 ENCOUNTER — Telehealth: Payer: Self-pay

## 2021-01-13 MED ORDER — METOPROLOL TARTRATE 25 MG PO TABS: 12.5000 mg | ORAL_TABLET | Freq: Two times a day (BID) | ORAL | 1 refills | Status: DC

## 2021-01-13 NOTE — Telephone Encounter (Signed)
Rx sent 

## 2021-01-13 NOTE — Telephone Encounter (Signed)
Spoke w/ [facility]- they wanted to verify sig for metoprolol 25mg  and atorvastatin 40mg . Informed Pt should be on metoprolol 25mg  1/2 tab bid and recently the atorvastatin 40mg  was increased to 1.5 tablet qhs on 12/16/20. Pharmacist verbalized understanding.

## 2021-01-13 NOTE — Telephone Encounter (Signed)
Medication: metoprolol tartrate (LOPRESSOR) 25 MG tablet    Has the patient contacted their pharmacy? No. (If no, request that the patient contact the pharmacy for the refill.) (If yes, when and what did the pharmacy advise?)  Preferred Pharmacy (with phone number or street name): [facility] - [place], [address]  [address] [place] [address]  Phone:  [phone] Fax:  [phone]  DEA #:  [id number]  Agent: Please be advised that RX refills may take up to 3 business days. We ask that you follow-up with your pharmacy.

## 2021-01-14 ENCOUNTER — Telehealth: Payer: Self-pay | Admitting: Internal Medicine

## 2021-01-14 NOTE — Telephone Encounter (Signed)
Appt scheduled 01/17/21 at 11:20am.

## 2021-01-14 NOTE — Telephone Encounter (Signed)
LMOM informing [name] to call office to schedule visit PCP for Friday.

## 2021-01-14 NOTE — Telephone Encounter (Signed)
FYI.Would you like for [place] to schedule an appt for Friday? This is the only day you have available.

## 2021-01-14 NOTE — Telephone Encounter (Signed)
Pt, wife call stating that her husband right leg are still swollen his Dermatologist Dr.Hall told him to elevate twice a day for an hour each   Please advice

## 2021-01-14 NOTE — Telephone Encounter (Signed)
Right leg swelling is a chronic issue, okay to see him in a couple of days

## 2021-01-17 ENCOUNTER — Ambulatory Visit (INDEPENDENT_AMBULATORY_CARE_PROVIDER_SITE_OTHER): Payer: Medicare Other | Admitting: Internal Medicine

## 2021-01-17 ENCOUNTER — Other Ambulatory Visit: Payer: Self-pay

## 2021-01-17 VITALS — BP 155/93 | HR 61 | Temp 96.6°F | Ht 69.0 in | Wt 197.4 lb

## 2021-01-17 DIAGNOSIS — L03115 Cellulitis of right lower limb: Secondary | ICD-10-CM

## 2021-01-17 DIAGNOSIS — L97919 Non-pressure chronic ulcer of unspecified part of right lower leg with unspecified severity: Secondary | ICD-10-CM

## 2021-01-17 DIAGNOSIS — M7989 Other specified soft tissue disorders: Secondary | ICD-10-CM | POA: Diagnosis not present

## 2021-01-17 DIAGNOSIS — I10 Essential (primary) hypertension: Secondary | ICD-10-CM | POA: Diagnosis not present

## 2021-01-17 MED ORDER — DOXYCYCLINE HYCLATE 100 MG PO TABS: 100.0000 mg | ORAL_TABLET | Freq: Two times a day (BID) | ORAL | 0 refills | Status: DC

## 2021-01-17 NOTE — Patient Instructions (Signed)
Please go to the first floor and get your ultrasound  Start taking the antibiotics  Keep the leg elevated 1 hour twice a day  Watch your salt intake  We are referring you to the wound care center.  If the area gets much worse call anytime  See you in June

## 2021-01-17 NOTE — Progress Notes (Signed)
Subjective:    Patient ID: [name], male    DOB: 26-Feb-1936, 85 y.o.   MRN: [id number]  DOS:  01/17/2021 Type of visit - description: Acute  The main concern of the patient is the swelling on the right leg and a ongoing problems with a leg ulcer.  They are concerned about infection. No recent fever chills He has a large scab at the chronic wound on the right leg, it typically looks better after he takes a shower. Has not seen any discharge other than some clear fluid.  He brought his blood pressure log.   Review of Systems See above   Past Medical History:  Diagnosis Date  . Arthritis   . BPH (benign prostatic hypertrophy)   . Carotid artery disease ([place])    Doppler, December 08, 2011, 00 62% R. ICA, 37-62% LICA, followup 1 year  . Coronary artery disease    DES Circumflex or CVA 2006  /  clear, February, 2011, EF 70%, no ischemia or  . Ejection fraction    EF 60%, echo, 2009, mildly calcified aortic leaflets  . History of colonic polyps   . Hyperlipidemia   . Hypertension   . Hypothyroidism   . Lumbar radiculopathy   . Multiple thyroid nodules    Avascular echogenic areas noted in the right thyroid at the time of carotid Doppler.   Marland Kitchen PONV (postoperative nausea and vomiting)    "nausea with first hip surgery"  . Skin cancer (melanoma) (HCC)    PMH of   . Spinal stenosis, lumbar   . Tremor    Fine tremor right upper extremity  . Venous insufficiency     Past Surgical History:  Procedure Laterality Date  . COLONOSCOPY    . CORONARY ANGIOPLASTY WITH STENT PLACEMENT  2006   x 2 stents; DES to CX and dRCA '06  . KNEE SURGERY  1970's   "chipped bone"  . LUMBAR LAMINECTOMY/DECOMPRESSION MICRODISCECTOMY Left 07/13/2016   Procedure: LUMBAR LAMINECTOMY AND FORAMINOTOMY Lumbar two, three, Lumbar three-four, Lumbar four-five, LEFT Lumbar five-Sacral one DISECTOMY;  Surgeon: Newman Pies, MD;  Location: [facility];  Service: Neurosurgery;  Laterality: Left;  . TOE  SURGERY  1997  . TONSILLECTOMY    . TOTAL HIP ARTHROPLASTY Left 2007  . TOTAL HIP ARTHROPLASTY Right 2010  . TOTAL KNEE ARTHROPLASTY Right 06/25/2014   Procedure: RIGHT TOTAL KNEE ARTHROPLASTY;  Surgeon: Gearlean Alf, MD;  Location: [facility];  Service: Orthopedics;  Laterality: Right;    Allergies as of 01/17/2021      Reactions   Amlodipine Besylate Other (See Comments)   REACTION: edema      Medication List       Accurate as of January 17, 2021 11:32 AM. If you have any questions, ask your nurse or doctor.        aspirin 81 MG tablet Take 1 tablet (81 mg total) by mouth daily.   atorvastatin 40 MG tablet Commonly known as: LIPITOR Take 1.5 tablets (60 mg total) by mouth at bedtime.   benazepril 20 MG tablet Commonly known as: LOTENSIN Take 1.5 tablets (30 mg total) by mouth daily.   clobetasol cream 0.05 % Commonly known as: TEMOVATE Apply 1 application topically 2 (two) times daily as needed.   diltiazem 180 MG 24 hr capsule Commonly known as: Dilt-XR Take 1 capsule (180 mg total) by mouth daily.   Eliquis 5 MG Tabs tablet Generic drug: apixaban TAKE 1 TABLET BY MOUTH TWICE DAILY  furosemide 40 MG tablet Commonly known as: LASIX TAKE 1 AND 1/2 TABLETS(60 MG) BY MOUTH TWICE DAILY   levothyroxine 150 MCG tablet Commonly known as: SYNTHROID TAKE 1 TABLET BY MOUTH EVERY DAY BEFORE BREAKFAST. ALONG WITH A 25MCG TABLET DAILY.   levothyroxine 25 MCG tablet Commonly known as: SYNTHROID TAKE 1 TABLET BY MOUTH DAILY BEFORE BREAKFAST ALONG WITH A 150MCG TABLET DAILY   Lumigan 0.01 % Soln Generic drug: bimatoprost Place 1 drop into both eyes 2 (two) times daily.   metoprolol tartrate 25 MG tablet Commonly known as: LOPRESSOR Take 0.5 tablets (12.5 mg total) by mouth 2 (two) times daily.   nitroGLYCERIN 0.4 MG SL tablet Commonly known as: NITROSTAT PLACE 1 TABLET UNDER THE TONGUE EVERY 5 MINUTES AS NEEDED FOR CHEST PAIN   Timolol Maleate (Once-Daily) 0.5 %  Soln Apply 1 drop to eye daily.          Objective:   Physical Exam BP (!) 155/93 (BP Location: Left Arm, Patient Position: Sitting, Cuff Size: Large)   Pulse 61   Temp (!) 96.6 F (35.9 C) (Temporal)   Ht 5\' 9"  (1.753 m)   Wt 197 lb 6.4 oz (89.5 kg)   SpO2 98%   BMI 29.15 kg/m  General:   Well developed, NAD, BMI noted. HEENT:  Normocephalic . Face symmetric, atraumatic Lower extremities:  R leg: Calf is indeed swollen.  Circumference is 17-3/4 inches, 2.5 inches larger compared to the left.  The calf is nontender but the swelling seems slightly tight. Good pedal pulses Has 2 scabs.  See picture Around the large scab, there skin is a slightly warm and red Skin: Not pale. Not jaundice Neurologic:  alert & oriented X3.  Speech normal, gait appropriate for age and unassisted Psych--  Cognition and judgment appear intact.  Cooperative with normal attention span and concentration.  Behavior appropriate. No anxious or depressed appearing.        Assessment     Assessment  (Transfer from  Dr. Linna Darner [phone]) Hyperglycemia, A1c 6.2 2013 HTN Hyperlipidemia Hypothyroidism  Thyroid US 2014-- no nodules, inhomogeneous  CV: --CAD: stents 2006;  low risk nuclear scan 2011, 03-2016 --A Fib dx 09/2018 at regular crads OV, on Eliquis, EF --Carotid artery disease [place] 12/2014:   Stable, 1-39% RICA stenosis. Stable, 32-67% LICA stenosis. [place] 12/2015: Next 2 years R leg edema, [place] (-)   DVT 08-2014 and 01/2019 [facility]: Dr Maureen Ralphs, back surgery Dr Arnoldo Morale 06-2016 Venous insufficiency, chronic R>L edema Tremor Glaucoma H/o skin cancer , denies Melanoma; sees derm, had a visit ~ 09/2017, Dr [place] Crane  PLAN: R leg ulcer, Worsening:  Felt to be secondary to venous insufficiency, question of infection (mild redness and warmness around the). They request further help, will refer to the wound care center. Rx doxycycline x1 week Leg elevation is a must R leg cellulitis, new: See above R  leg swelling, worsening, Felt to be secondary to venous insufficiency, measurement today is 17-3/4 inches, half inch larger than a couple of years ago, calf feels somewhat tight.  For completeness we will redo an [place] r/o  DVT. Hypothyroidism: Last TSH elevated, was recommended to increase levothyroxine to 150 mcg and 25 mcg.  Subsequent TSH was better HTN: Ambulatory BPs log reviewed, typically very good, in the last few days it has go up in the 140s.  Recommend to continue benazepril, diltiazem, Lasix, metoprolol.  Reassess on RTC.  Watch salt intake Cardiovascular: Saw cardiology 11/18/2020, felt to be stable from  the cardiac standpoint, they noted lower extremity edema felt to be venous reflux, better with higher dose of Lasix, was rec leg elevation and compression stocking. RTC as scheduled for June   This visit occurred during the SARS-CoV-2 public health emergency.  Safety protocols were in place, including screening questions prior to the visit, additional usage of staff PPE, and extensive cleaning of exam room while observing appropriate contact time as indicated for disinfecting solutions.

## 2021-01-18 ENCOUNTER — Ambulatory Visit (HOSPITAL_BASED_OUTPATIENT_CLINIC_OR_DEPARTMENT_OTHER)
Admission: RE | Admit: 2021-01-18 | Discharge: 2021-01-18 | Disposition: A | Discharge status: Home / Self Care | Payer: Medicare Other | Source: Ambulatory Visit | Attending: Internal Medicine | Admitting: Internal Medicine

## 2021-01-18 DIAGNOSIS — M7989 Other specified soft tissue disorders: Secondary | ICD-10-CM | POA: Diagnosis not present

## 2021-01-18 DIAGNOSIS — R600 Localized edema: Secondary | ICD-10-CM | POA: Diagnosis not present

## 2021-01-18 NOTE — Assessment & Plan Note (Signed)
R leg ulcer, Worsening:  Felt to be secondary to venous insufficiency, question of infection (mild redness and warmness around the). They request further help, will refer to the wound care center. Rx doxycycline x1 week Leg elevation is a must R leg cellulitis, new: See above R leg swelling, worsening, Felt to be secondary to venous insufficiency, measurement today is 17-3/4 inches, half inch larger than a couple of years ago, calf feels somewhat tight.  For completeness we will redo an [place] r/o  DVT. Hypothyroidism: Last TSH elevated, was recommended to increase levothyroxine to 150 mcg and 25 mcg.  Subsequent TSH was better HTN: Ambulatory BPs log reviewed, typically very good, in the last few days it has go up in the 140s.  Recommend to continue benazepril, diltiazem, Lasix, metoprolol.  Reassess on RTC.  Watch salt intake Cardiovascular: Saw cardiology 11/18/2020, felt to be stable from the cardiac standpoint, they noted lower extremity edema felt to be venous reflux, better with higher dose of Lasix, was rec leg elevation and compression stocking. RTC as scheduled for June

## 2021-01-30 ENCOUNTER — Encounter (HOSPITAL_BASED_OUTPATIENT_CLINIC_OR_DEPARTMENT_OTHER): Payer: Medicare Other | Attending: Internal Medicine | Admitting: Internal Medicine

## 2021-01-30 ENCOUNTER — Other Ambulatory Visit: Payer: Self-pay

## 2021-01-30 DIAGNOSIS — I872 Venous insufficiency (chronic) (peripheral): Secondary | ICD-10-CM | POA: Insufficient documentation

## 2021-01-30 DIAGNOSIS — Z87891 Personal history of nicotine dependence: Secondary | ICD-10-CM | POA: Diagnosis not present

## 2021-01-30 DIAGNOSIS — I10 Essential (primary) hypertension: Secondary | ICD-10-CM | POA: Insufficient documentation

## 2021-01-30 DIAGNOSIS — Z96651 Presence of right artificial knee joint: Secondary | ICD-10-CM | POA: Insufficient documentation

## 2021-01-30 DIAGNOSIS — I480 Paroxysmal atrial fibrillation: Secondary | ICD-10-CM | POA: Diagnosis not present

## 2021-01-30 DIAGNOSIS — L97819 Non-pressure chronic ulcer of other part of right lower leg with unspecified severity: Secondary | ICD-10-CM | POA: Insufficient documentation

## 2021-01-30 DIAGNOSIS — Z79899 Other long term (current) drug therapy: Secondary | ICD-10-CM | POA: Diagnosis not present

## 2021-01-30 DIAGNOSIS — I2510 Atherosclerotic heart disease of native coronary artery without angina pectoris: Secondary | ICD-10-CM | POA: Insufficient documentation

## 2021-01-30 DIAGNOSIS — Z96643 Presence of artificial hip joint, bilateral: Secondary | ICD-10-CM | POA: Insufficient documentation

## 2021-01-30 DIAGNOSIS — Z7901 Long term (current) use of anticoagulants: Secondary | ICD-10-CM | POA: Insufficient documentation

## 2021-01-30 DIAGNOSIS — Z955 Presence of coronary angioplasty implant and graft: Secondary | ICD-10-CM | POA: Diagnosis not present

## 2021-01-30 NOTE — Progress Notes (Signed)
KOEHN, SALEHI ([id number]) Visit Report for 01/30/2021 Abuse/Suicide Risk Screen Details Patient Name: Date of Service: [name], [name] 01/30/2021 9:00 A M Medical Record Number: [id number] Patient Account Number: [id number] Date of Birth/Sex: Treating RN: [birth year] (85 y.o. Male) Lorrin Jackson Primary Care Michaeal Davis: [name] Other Clinician: Referring [name]: Treating [name]/Extender: [name] Weeks in Treatment: 0 Abuse/Suicide Risk Screen Items Answer ABUSE RISK SCREEN: Has anyone close to you tried to hurt or harm you recentlyo No Do you feel uncomfortable with anyone in your familyo No Has anyone forced you do things that you didnt want to doo No Electronic Signature(s) Signed: 01/30/2021 4:36:36 PM By: Lorrin Jackson Entered By: Lorrin Jackson on 01/30/2021 10:03:00 -------------------------------------------------------------------------------- Activities of Daily Living Details Patient Name: Date of Service: [name], [name] 01/30/2021 9:00 A M Medical Record Number: [id number] Patient Account Number: [id number] Date of Birth/Sex: Treating RN: 04/18/1936 (85 y.o. Male) Lorrin Jackson Primary Care Ernie Sagrero: [name] Other Clinician: Referring Saprina Chuong: Treating Donnelle Rubey/Extender: [name] Weeks in Treatment: 0 Activities of Daily Living Items Answer Activities of Daily Living (Please select one for each item) Drive Automobile Completely Able T Medications ake Completely Able Use T elephone Completely Able Care for Appearance Completely Able Use T oilet Completely Able Bath / Shower Completely Able Dress Self Completely Able Feed Self Completely Able Walk Completely Able Get In / Out Bed Completely Able Housework Completely Able Prepare Meals Completely [place] for Self Completely Able Electronic Signature(s) Signed: 01/30/2021 4:36:36 PM By: Lorrin Jackson Entered By: Lorrin Jackson  on 01/30/2021 10:03:26 -------------------------------------------------------------------------------- Education Screening Details Patient Name: Date of Service: [name], [name] 01/30/2021 9:00 A M Medical Record Number: [id number] Patient Account Number: [id number] Date of Birth/Sex: Treating RN: [birth year] (84 y.o. Male) Lorrin Jackson Primary Care Delbra Zellars: [name] Other Clinician: Referring Aneth Schlagel: Treating Jara Feider/Extender: [name] Weeks in Treatment: 0 Primary Learner Assessed: Patient Learning Preferences/Education Level/Primary Language Learning Preference: Explanation, Demonstration, Printed Material Highest Education Level: College or Above Preferred Language: English Cognitive Barrier Language Barrier: No Translator Needed: No Memory Deficit: No Emotional Barrier: No Cultural/Religious Beliefs Affecting Medical Care: No Physical Barrier Impaired Vision: Yes Glasses Impaired Hearing: No Decreased Hand dexterity: No Knowledge/Comprehension Knowledge Level: High Comprehension Level: High Ability to understand written instructions: High Ability to understand verbal instructions: High Motivation Anxiety Level: Calm Cooperation: Cooperative Education Importance: Acknowledges Need Interest in Health Problems: Asks Questions Perception: Coherent Willingness to Engage in Self-Management High Activities: Readiness to Engage in Self-Management High Activities: Electronic Signature(s) Signed: 01/30/2021 4:36:36 PM By: Lorrin Jackson Entered By: Lorrin Jackson on 01/30/2021 10:03:59 -------------------------------------------------------------------------------- Fall Risk Assessment Details Patient Name: Date of Service: [name], [name] 01/30/2021 9:00 A M Medical Record Number: [id number] Patient Account Number: [id number] Date of Birth/Sex: Treating RN: [birth year] (84 y.o. Male) Lorrin Jackson Primary Care Damin Salido: [name] Other  Clinician: Referring Semisi Biela: Treating Taylan Marez/Extender: [name] Weeks in Treatment: 0 Fall Risk Assessment Items Have you had 2 or more falls in the last 12 monthso 0 No Have you had any fall that resulted in injury in the last 12 monthso 0 No FALLS RISK SCREEN History of falling - immediate or within 3 months 0 No Secondary diagnosis (Do you have 2 or more medical diagnoseso) 0 No Ambulatory aid None/bed rest/wheelchair/nurse 0 No Crutches/cane/walker 15 Yes Furniture 0 No Intravenous therapy Access/Saline/Heparin Lock 0 No Gait/Transferring Normal/ bed rest/ wheelchair 0 Yes  Weak (short steps with or without shuffle, stooped but able to lift head while walking, may seek 0 No support from furniture) Impaired (short steps with shuffle, may have difficulty arising from chair, head down, impaired 0 No balance) Mental Status Oriented to own ability 0 Yes Electronic Signature(s) Signed: 01/30/2021 4:36:36 PM By: Lorrin Jackson Entered By: Lorrin Jackson on 01/30/2021 10:04:26 -------------------------------------------------------------------------------- Foot Assessment Details Patient Name: Date of Service: [name], [name] 01/30/2021 9:00 A M Medical Record Number: [id number] Patient Account Number: [id number] Date of Birth/Sex: Treating RN: 03-04-1936 (84 y.o. Male) Lorrin Jackson Primary Care Chyrel Taha: [name] Other Clinician: Referring Nickalos Petersen: Treating Aliene Tamura/Extender: [name] Weeks in Treatment: 0 Foot Assessment Items Site Locations + = Sensation present, - = Sensation absent, C = Callus, U = Ulcer R = Redness, W = Warmth, M = Maceration, PU = Pre-ulcerative lesion F = Fissure, S = Swelling, D = Dryness Assessment Right: Left: Other Deformity: No No Prior Foot Ulcer: No No Prior Amputation: No No Charcot Joint: No No Ambulatory Status: Ambulatory With Help Assistance Device: Cane Gait: Steady Electronic  Signature(s) Signed: 01/30/2021 4:36:36 PM By: Lorrin Jackson Entered By: Lorrin Jackson on 01/30/2021 10:09:47 -------------------------------------------------------------------------------- Nutrition Risk Screening Details Patient Name: Date of Service: [name], [name] 01/30/2021 9:00 A M Medical Record Number: [id number] Patient Account Number: [id number] Date of Birth/Sex: Treating RN: 1935/11/30 (84 y.o. Male) Lorrin Jackson Primary Care Brydan Downard: [name] Other Clinician: Referring Dianelys Scinto: Treating Marai Teehan/Extender: [name] Weeks in Treatment: 0 Height (in): Weight (lbs): Body Mass Index (BMI): Nutrition Risk Screening Items Score Screening NUTRITION RISK SCREEN: I have an illness or condition that made me change the kind and/or amount of food I eat 0 No I eat fewer than two meals per day 0 No I eat few fruits and vegetables, or milk products 0 No I have three or more drinks of beer, liquor or wine almost every day 0 No I have tooth or mouth problems that make it hard for me to eat 0 No I don't always have enough money to buy the food I need 0 No I eat alone most of the time 0 No I take three or more different prescribed or over-the-counter drugs a day 1 Yes Without wanting to, I have lost or gained 10 pounds in the last six months 0 No I am not always physically able to shop, cook and/or feed myself 0 No Nutrition Protocols Good Risk Protocol 0 No interventions needed Moderate Risk Protocol High Risk Proctocol Risk Level: Good Risk Score: 1 Electronic Signature(s) Signed: 01/30/2021 4:36:36 PM By: Lorrin Jackson Entered By: Lorrin Jackson on 01/30/2021 10:04:41

## 2021-02-03 NOTE — Progress Notes (Signed)
BABE, CLENNEY ([id number]) Visit Report for 01/30/2021 Chief Complaint Document Details Patient Name: Date of Service: [name], [name] 01/30/2021 9:00 A M Medical Record Number: [id number] Patient Account Number: [id number] Date of Birth/Sex: Treating RN: Apr 03, 1936 (84 y.o. Male) Rhae Hammock Primary Care Provider: [name] Other Clinician: Referring Provider: Treating Provider/Extender: [name]: 0 Information Obtained from: Patient Chief Complaint Right lower extremity wound Electronic Signature(s) Signed: 01/30/2021 11:13:12 AM By: Kalman Shan DO Entered By: Kalman Shan on 01/30/2021 10:49:23 -------------------------------------------------------------------------------- Debridement Details Patient Name: Date of Service: [name], [name] 01/30/2021 9:00 A M Medical Record Number: [id number] Patient Account Number: [id number] Date of Birth/Sex: Treating RN: 1935-11-09 (84 y.o. Male) Rhae Hammock Primary Care Provider: [name] Other Clinician: Referring Provider: Treating Provider/Extender: [name]: 0 Debridement Performed for Assessment: Wound #1 Right,Medial Lower Leg Performed By: Physician Kalman Shan, DO Debridement Type: Debridement Severity of Tissue Pre Debridement: Fat layer exposed Level of Consciousness (Pre-procedure): Awake and Alert Pre-procedure Verification/Time Out Yes - 10:33 Taken: Start Time: 10:33 Pain Control: Lidocaine T Area Debrided (L x W): otal 9.5 (cm) x 9 (cm) = 85.5 (cm) Tissue and other material debrided: Viable, Non-Viable, Slough, Subcutaneous, Skin: Dermis , Skin: Epidermis, Slough Level: Skin/Subcutaneous Tissue Debridement Description: Excisional Instrument: Curette Bleeding: Minimum Hemostasis Achieved: Pressure End Time: 10:34 Procedural Pain: 0 Post Procedural Pain: 0 Response to Treatment: Procedure was tolerated  well Level of Consciousness (Post- Awake and Alert procedure): Post Debridement Measurements of Total Wound Length: (cm) 9.5 Width: (cm) 9 Depth: (cm) 0.1 Volume: (cm) 6.715 Character of Wound/Ulcer Post Debridement: Improved Severity of Tissue Post Debridement: Fat layer exposed Post Procedure Diagnosis Same as Pre-procedure Electronic Signature(s) Signed: 01/30/2021 11:13:12 AM By: Kalman Shan DO Signed: 02/03/2021 6:08:57 PM By: Rhae Hammock RN Entered By: Rhae Hammock on 01/30/2021 10:34:43 -------------------------------------------------------------------------------- HPI Details Patient Name: Date of Service: [name], [name] 01/30/2021 9:00 A M Medical Record Number: [id number] Patient Account Number: [id number] Date of Birth/Sex: Treating RN: [birth year] (84 y.o. Male) Rhae Hammock Primary Care Provider: [name] Other Clinician: Referring Provider: Treating Provider/Extender: [name]: 0 History of Present Illness HPI Description: [name] is an 85 year old male with a past medical history of CAD s/p DES to LCx 2006, carotid artery disease, hypertension, hyperlipidemia, paroxysmal atrial fibrillation on Eliquis and Cardizem that presents today for right lower extremity wound. He was seen by Dr. Larose Kells, his primary care provider on 4/29 for this issue and was referred to our clinic. Patient states that he had a small wound that spontaneously started in October 2021 and has not healed. He states this has progressively gotten larger. He has been using acne cream prescribed by the dermatologist for this issue. He reports drainage to the wound but no purulent drainage. He reports mild soreness to the wound. He has swelling in his right leg greater than left and reports this is a chronic issue for the past 1 to 2 years. He denies resting leg pain or pain with ambulation. Of note He was prescribed doxycycline at the end of  last month and has finished this course for possible skin infection to the wound site. Electronic Signature(s) Signed: 01/30/2021 11:13:12 AM By: Kalman Shan DO Entered By: Kalman Shan on 01/30/2021 11:11:25 -------------------------------------------------------------------------------- Physical Exam Details Patient Name: Date of Service: [name], [name] 01/30/2021 9:00 A M Medical Record Number: [id number] Patient Account Number:  [id number] Date of Birth/Sex: Treating RN: [birth year] (84 y.o. Male) Rhae Hammock Primary Care Provider: [name] Other Clinician: Referring Provider: Treating Provider/Extender: [name]: 0 Constitutional respirations regular, non-labored and within target range for patient.. Cardiovascular 2+ dorsalis pedis/posterior tibialis pulses. Psychiatric pleasant and cooperative. Notes Right lower extremity: Large ulceration to the distal leg. Sloughing and nonviable tissue throughout the wound. Post debridement there was granulation tissue present. No signs of infection. 2+ pitting edema to the knee. Venous stasis changes Electronic Signature(s) Signed: 01/30/2021 11:13:12 AM By: Kalman Shan DO Entered By: Kalman Shan on 01/30/2021 10:54:31 -------------------------------------------------------------------------------- Physician Orders Details Patient Name: Date of Service: [name], [name] 01/30/2021 9:00 A M Medical Record Number: [id number] Patient Account Number: [id number] Date of Birth/Sex: Treating RN: 03-May-1936 (85 y.o. Male) Rhae Hammock Primary Care Provider: [name] Other Clinician: Referring Provider: Treating Provider/Extender: [name]: 0 Verbal / Phone Orders: No Diagnosis Coding ICD-10 Coding Code Description I87.2 Venous insufficiency (chronic) (peripheral) L97.819 Non-pressure chronic ulcer of other part of right lower leg  with unspecified severity I25.10 Atherosclerotic heart disease of native coronary artery without angina pectoris I48.0 Paroxysmal atrial fibrillation Z79.01 Long term (current) use of anticoagulants Follow-up Appointments ppointment in 1 week. - with Dr. Heber [place] Return A Bathing/ Shower/ Hygiene May shower with protection but do not get wound dressing(s) wet. - May use cast protectors to cover your wrap...you can get those at [facility] or [facility]. Edema Control - Lymphedema / SCD / Other Elevate legs to the level of the heart or above for 30 minutes daily and/or when sitting, a frequency of: Avoid standing for long periods of time. Compression stocking or Garment 20-30 mm/Hg pressure to: - We will order juxtalite compression stockings today. Wound Treatment Wound #1 - Lower Leg Wound Laterality: Right, Medial Cleanser: Soap and Water 1 x Per Week/15 Days Discharge Instructions: May shower and wash wound with dial antibacterial soap and water prior to dressing change. Cleanser: Wound Cleanser 1 x Per Week/15 Days Discharge Instructions: Cleanse the wound with wound cleanser prior to applying a clean dressing using gauze sponges, not tissue or cotton balls. Peri-Wound Care: Sween Lotion (Moisturizing lotion) 1 x Per Week/15 Days Discharge Instructions: Apply moisturizing lotion as directed Prim Dressing: Hydrofera Blue Classic Foam, 2x2 in 1 x Per Week/15 Days ary Discharge Instructions: Moisten with saline prior to applying to wound bed Prim Dressing: Santyl Ointment 1 x Per Week/15 Days ary Discharge Instructions: Apply nickel thick amount to wound bed as instructed Secondary Dressing: Woven Gauze Sponge, Non-Sterile 4x4 in 1 x Per Week/15 Days Discharge Instructions: Apply over primary dressing as directed. Secondary Dressing: ABD Pad, 5x9 1 x Per Week/15 Days Discharge Instructions: Apply over primary dressing as directed. Compression Wrap: ThreePress (3 layer compression wrap) 1 x Per  Week/15 Days Discharge Instructions: Apply three layer compression as directed. Compression Stockings: Circaid Juxta Lite Compression Wrap (DME) Left Leg Compression Amount: 20-30 mmHG Right Leg Compression Amount: 20-30 mmHG Discharge Instructions: Apply Circaid Juxta Lite Compression Wrap daily as instructed. Apply first thing in the morning, remove at night before bed. Electronic Signature(s) Signed: 01/30/2021 11:13:12 AM By: Kalman Shan DO Entered By: Kalman Shan on 01/30/2021 10:54:46 -------------------------------------------------------------------------------- Problem List Details Patient Name: Date of Service: [name], [name] 01/30/2021 9:00 A M Medical Record Number: [id number] Patient Account Number: [id number] Date of Birth/Sex: Treating RN: 1936/03/26 (84 y.o. Male) Rhae Hammock Primary Care Provider: [name] Other  Clinician: Referring Provider: Treating Provider/Extender: [name]: 0 Active Problems ICD-10 Encounter Code Description Active Date MDM Diagnosis L97.819 Non-pressure chronic ulcer of other part of right lower leg with unspecified 01/30/2021 No Yes severity I87.2 Venous insufficiency (chronic) (peripheral) 01/30/2021 No Yes I25.10 Atherosclerotic heart disease of native coronary artery without angina pectoris 01/30/2021 No Yes I48.0 Paroxysmal atrial fibrillation 01/30/2021 No Yes Z79.01 Long term (current) use of anticoagulants 01/30/2021 No Yes Inactive Problems Resolved Problems Electronic Signature(s) Signed: 01/30/2021 11:13:12 AM By: Kalman Shan DO Entered By: Kalman Shan on 01/30/2021 10:48:35 -------------------------------------------------------------------------------- Progress Note Details Patient Name: Date of Service: [name], [name] 01/30/2021 9:00 A M Medical Record Number: [id number] Patient Account Number: [id number] Date of Birth/Sex: Treating RN: 01-Mar-1936 (84 y.o.  Male) Rhae Hammock Primary Care Provider: [name] Other Clinician: Referring Provider: Treating Provider/Extender: [name]: 0 Subjective Chief Complaint Information obtained from Patient Right lower extremity wound History of Present Illness (HPI) [name] is an 85 year old male with a past medical history of CAD s/p DES to LCx 2006, carotid artery disease, hypertension, hyperlipidemia, paroxysmal atrial fibrillation on Eliquis and Cardizem that presents today for right lower extremity wound. He was seen by Dr. Larose Kells, his primary care provider on 4/29 for this issue and was referred to our clinic. Patient states that he had a small wound that spontaneously started in October 2021 and has not healed. He states this has progressively gotten larger. He has been using acne cream prescribed by the dermatologist for this issue. He reports drainage to the wound but no purulent drainage. He reports mild soreness to the wound. He has swelling in his right leg greater than left and reports this is a chronic issue for the past 1 to 2 years. Of note He was prescribed doxycycline at the end of last month and has finished this course for possible skin infection to the wound site. Patient History Information obtained from Patient. Allergies No Known Allergies Family History Cancer - Father, Diabetes - Maternal Grandparents, Heart Disease - Maternal Grandparents, Hypertension - Maternal Grandparents, No family history of Hereditary Spherocytosis, Kidney Disease, Lung Disease, Seizures, Stroke, Thyroid Problems, Tuberculosis. Social History Former smoker - Quit over 30 years ago, Marital Status - Married, Alcohol Use - Rarely, Drug Use - No History, Caffeine Use - Daily. Medical History Eyes Patient has history of Glaucoma Cardiovascular Patient has history of Coronary Artery Disease, Hypertension, Peripheral Venous Disease Integumentary (Skin) Denies  history of History of Burn Musculoskeletal Patient has history of Osteoarthritis Medical A Surgical History Notes nd Endocrine Hypothyroidism Genitourinary BPH Musculoskeletal Bilat Hip Replacements, Right Knee Replacement Oncologic Skin Cancer multiple sites Review of Systems (ROS) Eyes Complains or has symptoms of Glasses / Contacts. Ear/Nose/Mouth/Throat Denies complaints or symptoms of Chronic sinus problems or rhinitis. Respiratory Denies complaints or symptoms of Chronic or frequent coughs, Shortness of Breath. Gastrointestinal Denies complaints or symptoms of Frequent diarrhea, Nausea, Vomiting. Integumentary (Skin) Complains or has symptoms of Wounds - R leg. Neurologic Denies complaints or symptoms of Numbness/parasthesias. Psychiatric Denies complaints or symptoms of Claustrophobia, Suicidal. Objective Constitutional respirations regular, non-labored and within target range for patient.. Vitals Time Taken: 9:49 AM, Temperature: 97.6 F, Pulse: 60 bpm, Respiratory Rate: 18 breaths/min, Blood Pressure: 122/70 mmHg. Cardiovascular 2+ dorsalis pedis/posterior tibialis pulses. Psychiatric pleasant and cooperative. General Notes: Right lower extremity: Large ulceration to the distal leg. Sloughing and nonviable tissue throughout the wound. Post debridement there was granulation tissue  present. No signs of infection. 2+ pitting edema to the knee. Venous stasis changes Integumentary (Hair, Skin) Wound #1 status is Open. Original cause of wound was Gradually Appeared. The date acquired was: 07/02/2020. The wound is located on the Right,Medial Lower Leg. The wound measures 9.5cm length x 9cm width x 0.1cm depth; 67.152cm^2 area and 6.715cm^3 volume. There is Fat Layer (Subcutaneous Tissue) exposed. There is no tunneling or undermining noted. There is a medium amount of serosanguineous drainage noted. The wound margin is distinct with the outline attached to the wound base.  There is small (1-33%) pink granulation within the wound bed. There is a large (67-100%) amount of necrotic tissue within the wound bed including Adherent Slough. Assessment Active Problems ICD-10 Non-pressure chronic ulcer of other part of right lower leg with unspecified severity Venous insufficiency (chronic) (peripheral) Atherosclerotic heart disease of native coronary artery without angina pectoris Paroxysmal atrial fibrillation Long term (current) use of anticoagulants Patient presents with a chronic nonhealing wound to his right lower extremity. This was debrided in office with no signs of infection. This appears to be related to chronic venous insufficiency. He has never had wounds on his legs before But has had chronic right lower extremity swelling for the past 1 to 2 years. His ABIs were 1.31 on the right. He had a DVT study that was negative for clot on 01/18/2021. Patient was evaluated by Dr. Donzetta Matters on 04/2019 for bilateral lower extremity swelling right greater than left. He had venous reflux study that noted abnormal reflux at the great saphenous vein however not large enough to intervene on. Compression stockings were recommended. Patient does not use these. I think at this time we can use a 3 layer compression wrap with Santyl and Hydrofera Blue for the next week. I told him if he had any issues including increased pain to call our office and he can take the wrap off. We will see him in follow-up in 1 week Procedures Wound #1 Pre-procedure diagnosis of Wound #1 is a Venous Leg Ulcer located on the Right,Medial Lower Leg .Severity of Tissue Pre Debridement is: Fat layer exposed. There was a Excisional Skin/Subcutaneous Tissue Debridement with a total area of 85.5 sq cm performed by Kalman Shan, DO. With the following instrument(s): Curette to remove Viable and Non-Viable tissue/material. Material removed includes Subcutaneous Tissue, Slough, Skin: Dermis, and Skin: Epidermis  after achieving pain control using Lidocaine. No specimens were taken. A time out was conducted at 10:33, prior to the start of the procedure. A Minimum amount of bleeding was controlled with Pressure. The procedure was tolerated well with a pain level of 0 throughout and a pain level of 0 following the procedure. Post Debridement Measurements: 9.5cm length x 9cm width x 0.1cm depth; 6.715cm^3 volume. Character of Wound/Ulcer Post Debridement is improved. Severity of Tissue Post Debridement is: Fat layer exposed. Post procedure Diagnosis Wound #1: Same as Pre-Procedure Plan Follow-up Appointments: Return Appointment in 1 week. - with Dr. Heber Sprague Bathing/ Shower/ Hygiene: May shower with protection but do not get wound dressing(s) wet. - May use cast protectors to cover your wrap...you can get those at [facility] or [facility]. Edema Control - Lymphedema / SCD / Other: Elevate legs to the level of the heart or above for 30 minutes daily and/or when sitting, a frequency of: Avoid standing for long periods of time. Compression stocking or Garment 20-30 mm/Hg pressure to: - We will order juxtalite compression stockings today. WOUND #1: - Lower Leg Wound Laterality: Right, Medial  Cleanser: Soap and Water 1 x Per Week/15 Days Discharge Instructions: May shower and wash wound with dial antibacterial soap and water prior to dressing change. Cleanser: Wound Cleanser 1 x Per Week/15 Days Discharge Instructions: Cleanse the wound with wound cleanser prior to applying a clean dressing using gauze sponges, not tissue or cotton balls. Peri-Wound Care: Sween Lotion (Moisturizing lotion) 1 x Per Week/15 Days Discharge Instructions: Apply moisturizing lotion as directed Prim Dressing: Hydrofera Blue Classic Foam, 2x2 in 1 x Per Week/15 Days ary Discharge Instructions: Moisten with saline prior to applying to wound bed Prim Dressing: Santyl Ointment 1 x Per Week/15 Days ary Discharge Instructions: Apply nickel thick  amount to wound bed as instructed Secondary Dressing: Woven Gauze Sponge, Non-Sterile 4x4 in 1 x Per Week/15 Days Discharge Instructions: Apply over primary dressing as directed. Secondary Dressing: ABD Pad, 5x9 1 x Per Week/15 Days Discharge Instructions: Apply over primary dressing as directed. Com pression Wrap: ThreePress (3 layer compression wrap) 1 x Per Week/15 Days Discharge Instructions: Apply three layer compression as directed. Com pression Stockings: Circaid Juxta Lite Compression Wrap (DME) Compression Amount: 20-30 mmHg (left) Compression Amount: 20-30 mmHg (right) Discharge Instructions: Apply Circaid Juxta Lite Compression Wrap daily as instructed. Apply first thing in the morning, remove at night before bed. 1. Hydrofera Blue and Santyl under 3 layer compression 2. Follow-up in 1 week 3. Order juxta lite Electronic Signature(s) Signed: 01/30/2021 11:13:12 AM By: Kalman Shan DO Entered By: Kalman Shan on 01/30/2021 11:09:27 -------------------------------------------------------------------------------- HxROS Details Patient Name: Date of Service: [name], [name] 01/30/2021 9:00 A M Medical Record Number: [id number] Patient Account Number: [id number] Date of Birth/Sex: Treating RN: March 20, 1936 (85 y.o. Male) Lorrin Jackson Primary Care Provider: [name] Other Clinician: Referring Provider: Treating Provider/Extender: [name]: 0 Information Obtained From Patient Eyes Complaints and Symptoms: Positive for: Glasses / Contacts Medical History: Positive for: Glaucoma Ear/Nose/Mouth/Throat Complaints and Symptoms: Negative for: Chronic sinus problems or rhinitis Respiratory Complaints and Symptoms: Negative for: Chronic or frequent coughs; Shortness of Breath Gastrointestinal Complaints and Symptoms: Negative for: Frequent diarrhea; Nausea; Vomiting Integumentary (Skin) Complaints and Symptoms: Positive for:  Wounds - R leg Medical History: Negative for: History of Burn Neurologic Complaints and Symptoms: Negative for: Numbness/parasthesias Psychiatric Complaints and Symptoms: Negative for: Claustrophobia; Suicidal Hematologic/Lymphatic Cardiovascular Medical History: Positive for: Coronary Artery Disease; Hypertension; Peripheral Venous Disease Endocrine Medical History: Past Medical History Notes: Hypothyroidism Genitourinary Medical History: Past Medical History Notes: BPH Immunological Musculoskeletal Medical History: Positive for: Osteoarthritis Past Medical History Notes: Bilat Hip Replacements, Right Knee Replacement Oncologic Medical History: Past Medical History Notes: Skin Cancer multiple sites HBO Extended History Items Eyes: Glaucoma Immunizations Pneumococcal Vaccine: Received Pneumococcal Vaccination: No Implantable Devices None Family and Social History Cancer: Yes - Father; Diabetes: Yes - Maternal Grandparents; Heart Disease: Yes - Maternal Grandparents; Hereditary Spherocytosis: No; Hypertension: Yes - Maternal Grandparents; Kidney Disease: No; Lung Disease: No; Seizures: No; Stroke: No; Thyroid Problems: No; Tuberculosis: No; Former smoker - Quit over 30 years ago; Marital Status - Married; Alcohol Use: Rarely; Drug Use: No History; Caffeine Use: Daily; Financial Concerns: No; Food, Clothing or Shelter Needs: No; Support System Lacking: No; Transportation Concerns: No Electronic Signature(s) Signed: 01/30/2021 11:13:12 AM By: Kalman Shan DO Signed: 01/30/2021 4:36:36 PM By: Lorrin Jackson Entered By: Lorrin Jackson on 01/30/2021 10:02:51 -------------------------------------------------------------------------------- Sims Details Patient Name: Date of Service: [name], [name] 01/30/2021 Medical Record Number: [id number] Patient Account Number: [id number] Date of Birth/Sex:  Treating RN: 1936-05-09 (85 y.o. Male) Rhae Hammock Primary  Care Provider: [name] Other Clinician: Referring Provider: Treating Provider/Extender: [name]: 0 Diagnosis Coding ICD-10 Codes Code Description [phone] Non-pressure chronic ulcer of other part of right lower leg with unspecified severity I87.2 Venous insufficiency (chronic) (peripheral) I25.10 Atherosclerotic heart disease of native coronary artery without angina pectoris I48.0 Paroxysmal atrial fibrillation Z79.01 Long term (current) use of anticoagulants Facility Procedures CPT4 Code: [id number] Description: [place] VISIT-LEV 3 EST PT Modifier: Quantity: 1 CPT4 Code: [id number] Description: 25852 - DEB SUBQ TISSUE 20 SQ CM/< ICD-10 Diagnosis Description L97.819 Non-pressure chronic ulcer of other part of right lower leg with unspecified sev Modifier: erity Quantity: 1 CPT4 Code: [id number] Description: 11045 - DEB SUBQ TISS EA ADDL 20CM ICD-10 Diagnosis Description L97.819 Non-pressure chronic ulcer of other part of right lower leg with unspecified sev Modifier: erity Quantity: 4 Physician Procedures : CPT4 Code Description Modifier [id number] 54008 - WC PHYS LEVEL 4 - NEW PT ICD-10 Diagnosis Description L97.819 Non-pressure chronic ulcer of other part of right lower leg with unspecified severity I87.2 Venous insufficiency (chronic) (peripheral) I25.10  Atherosclerotic heart disease of native coronary artery without angina pectoris Quantity: 1 : [id number] 11042 - WC PHYS SUBQ TISS 20 SQ CM ICD-10 Diagnosis Description L97.819 Non-pressure chronic ulcer of other part of right lower leg with unspecified severity Quantity: 1 : [phone] 11045 - WC PHYS SUBQ TISS EA ADDL 20 CM ICD-10 Diagnosis Description L97.819 Non-pressure chronic ulcer of other part of right lower leg with unspecified severity Quantity: 4 Electronic Signature(s) Signed: 01/30/2021 11:13:12 AM By: Kalman Shan DO Entered By: Kalman Shan on 01/30/2021 11:10:47

## 2021-02-03 NOTE — Progress Notes (Signed)
VEGA, WITHROW ([id number]) Visit Report for 01/30/2021 Allergy List Details Patient Name: Date of Service: [name], [name] 01/30/2021 9:00 A M Medical Record Number: [id number] Patient Account Number: [id number] Date of Birth/Sex: Treating RN: Nov 10, 1935 (85 y.o. Male) Lorrin Jackson Primary Care Doreene Forrey: [name] Other Clinician: Referring Jeiry Birnbaum: Treating Johnmark Geiger/Extender: [name]: 0 Allergies Active Allergies No Known Allergies Allergy Notes Electronic Signature(s) Signed: 01/30/2021 4:36:36 PM By: Lorrin Jackson Entered By: Lorrin Jackson on 01/30/2021 09:55:52 -------------------------------------------------------------------------------- Arrival Information Details Patient Name: Date of Service: [name], [name] 01/30/2021 9:00 A M Medical Record Number: [id number] Patient Account Number: [id number] Date of Birth/Sex: Treating RN: 08-28-1936 (84 y.o. Male) Rhae Hammock Primary Care Madysen Faircloth: [name] Other Clinician: Referring Shifa Brisbon: Treating Jeramiah Mccaughey/Extender: [name] in Treatment: 0 Visit Information Patient Arrived: [name] Arrival Time: 09:49 Accompanied By: wife Transfer Assistance: None Patient Identification Verified: Yes Secondary Verification Process Completed: Yes Patient Requires Transmission-Based Precautions: No Patient Has Alerts: Yes Patient Alerts: Patient on Blood Thinner Electronic Signature(s) Signed: 01/30/2021 4:36:36 PM By: Lorrin Jackson Entered By: Lorrin Jackson on 01/30/2021 09:52:46 -------------------------------------------------------------------------------- Clinic Level of Care Assessment Details Patient Name: Date of Service: [name], [name] 01/30/2021 9:00 A M Medical Record Number: [id number] Patient Account Number: [id number] Date of Birth/Sex: Treating RN: [birth year] (84 y.o. Male) Rhae Hammock Primary Care Ivery Nanney: [name] Other  Clinician: Referring Nicklous Aburto: Treating Arwa Yero/Extender: [name]: 0 Clinic Level of Care Assessment Items TOOL 1 Quantity Score X- 1 0 Use when EandM and Procedure is performed on INITIAL visit ASSESSMENTS - Nursing Assessment / Reassessment X- 1 20 General Physical Exam (combine w/ comprehensive assessment (listed just below) when performed on new pt. evals) X- 1 25 Comprehensive Assessment (HX, ROS, Risk Assessments, Wounds Hx, etc.) ASSESSMENTS - Wound and Skin Assessment / Reassessment X- 1 10 Dermatologic / Skin Assessment (not related to wound area) ASSESSMENTS - Ostomy and/or Continence Assessment and Care []  - 0 Incontinence Assessment and Management []  - 0 Ostomy Care Assessment and Management (repouching, etc.) PROCESS - Coordination of Care []  - 0 Simple Patient / Family Education for ongoing care X- 1 20 Complex (extensive) Patient / Family Education for ongoing care X- 1 10 Staff obtains Programmer, systems, Records, T Results / Process Orders est []  - 0 Staff telephones HHA, Nursing Homes / Clarify orders / etc []  - 0 Routine Transfer to another Facility (non-emergent condition) []  - 0 Routine Hospital Admission (non-emergent condition) X- 1 15 New Admissions / Biomedical engineer / Ordering NPWT Apligraf, etc. , []  - 0 Emergency Hospital Admission (emergent condition) PROCESS - Special Needs []  - 0 Pediatric / Minor Patient Management []  - 0 Isolation Patient Management []  - 0 Hearing / Language / Visual special needs []  - 0 Assessment of Community assistance (transportation, D/C planning, etc.) []  - 0 Additional assistance / Altered mentation []  - 0 Support Surface(s) Assessment (bed, cushion, seat, etc.) INTERVENTIONS - Miscellaneous []  - 0 External ear exam []  - 0 Patient Transfer (multiple staff / Civil Service fast streamer / Similar devices) []  - 0 Simple Staple / Suture removal (25 or less) []  - 0 Complex Staple /  Suture removal (26 or more) []  - 0 Hypo/Hyperglycemic Management (do not check if billed separately) X- 1 15 Ankle / Brachial Index (ABI) - do not check if billed separately Has the patient been seen at the hospital within the last three years: Yes Total Score:  115 Level Of Care: New/Established - Level 3 Electronic Signature(s) Signed: 02/03/2021 6:08:57 PM By: Rhae Hammock RN Entered By: Rhae Hammock on 01/30/2021 10:51:14 -------------------------------------------------------------------------------- Encounter Discharge Information Details Patient Name: Date of Service: [name], [name] 01/30/2021 9:00 A M Medical Record Number: [id number] Patient Account Number: [id number] Date of Birth/Sex: Treating RN: Jul 25, 1936 (84 y.o. Male) Baruch Gouty Primary Care Quay Simkin: [name] Other Clinician: Referring Ger Ringenberg: Treating Antone Summons/Extender: [name] in Treatment: 0 Encounter Discharge Information Items Post Procedure Vitals Discharge Condition: Stable Temperature (F): 97.6 Ambulatory Status: Cane Pulse (bpm): 60 Discharge Destination: Home Respiratory Rate (breaths/min): 18 Transportation: Private Auto Blood Pressure (mmHg): 122/70 Accompanied By: spouse Schedule Follow-up Appointment: Yes Clinical Summary of Care: Patient Declined Electronic Signature(s) Signed: 02/03/2021 4:50:33 PM By: Baruch Gouty RN, BSN Entered By: Baruch Gouty on 01/30/2021 11:15:45 -------------------------------------------------------------------------------- Lower Extremity Assessment Details Patient Name: Date of Service: [name], [name] 01/30/2021 9:00 A M Medical Record Number: [id number] Patient Account Number: [id number] Date of Birth/Sex: Treating RN: 09/13/1936 (84 y.o. Male) Lorrin Jackson Primary Care Rollie Hynek: [name] Other Clinician: Referring Mendi Constable: Treating Anjolaoluwa Siguenza/Extender: [name]:  0 Edema Assessment Assessed: [Left: Yes] [Right: Yes] Edema: [Left: Yes] [Right: Yes] Calf Left: Right: Point of Measurement: 31 cm From Medial Instep 37 cm 45 cm Ankle Left: Right: Point of Measurement: 9 cm From Medial Instep 24 cm 26 cm Knee To Floor Left: Right: From Medial Instep 45 cm 45 cm Vascular Assessment Pulses: Dorsalis Pedis Palpable: [Left:Yes] [Right:Yes] Posterior Tibial Palpable: [Left:Yes] [Right:Yes] Blood Pressure: Brachial: [Right:122] Ankle: [Right:Dorsalis Pedis: 160] Ankle Brachial Index: [Right:1.31] Electronic Signature(s) Signed: 01/30/2021 4:36:36 PM By: Lorrin Jackson Signed: 02/03/2021 6:08:57 PM By: Rhae Hammock RN Entered By: Rhae Hammock on 01/30/2021 10:46:58 -------------------------------------------------------------------------------- Multi Wound Chart Details Patient Name: Date of Service: [name], [name] 01/30/2021 9:00 A M Medical Record Number: [id number] Patient Account Number: [id number] Date of Birth/Sex: Treating RN: 1935/09/23 (84 y.o. Male) Rhae Hammock Primary Care Jahad Old: [name] Other Clinician: Referring Italo Banton: Treating Dalvin Clipper/Extender: [name]: 0 Vital Signs Height(in): Pulse(bpm): 60 Weight(lbs): Blood Pressure(mmHg): 122/70 Body Mass Index(BMI): Temperature(F): 97.6 Respiratory Rate(breaths/min): 18 Photos: [1:No Photos Right, Medial Lower Leg] [N/A:N/A N/A] Wound Location: [1:Gradually Appeared] [N/A:N/A] Wounding Event: [1:Venous Leg Ulcer] [N/A:N/A] Primary Etiology: [1:Glaucoma, Coronary Artery Disease, N/A] Comorbid History: [1:Hypertension, Peripheral Venous Disease, Osteoarthritis 07/02/2020] [N/A:N/A] Date Acquired: [1:0] [N/A:N/A] Weeks of Treatment: [1:Open] [N/A:N/A] Wound Status: [1:9.5x9x0.1] [N/A:N/A] Measurements L x W x D (cm) [1:67.152] [N/A:N/A] A (cm) : rea [1:6.715] [N/A:N/A] Volume (cm) : [1:Full Thickness Without  Exposed] [N/A:N/A] Classification: [1:Support Structures Medium] [N/A:N/A] Exudate A mount: [1:Serosanguineous] [N/A:N/A] Exudate Type: [1:red, brown] [N/A:N/A] Exudate Color: [1:Distinct, outline attached] [N/A:N/A] Wound Margin: [1:Small (1-33%)] [N/A:N/A] Granulation A mount: [1:Pink] [N/A:N/A] Granulation Quality: [1:Large (67-100%)] [N/A:N/A] Necrotic A mount: [1:Fat Layer (Subcutaneous Tissue): Yes N/A] Exposed Structures: [1:Fascia: No Tendon: No Muscle: No Joint: No Bone: No None] [N/A:N/A] Epithelialization: [1:Debridement - Excisional] [N/A:N/A] Debridement: Pre-procedure Verification/Time Out 10:33 [N/A:N/A] Taken: [1:Lidocaine] [N/A:N/A] Pain Control: [1:Subcutaneous, Slough] [N/A:N/A] Tissue Debrided: [1:Skin/Subcutaneous Tissue] [N/A:N/A] Level: [1:85.5] [N/A:N/A] Debridement A (sq cm): [1:rea Curette] [N/A:N/A] Instrument: [1:Minimum] [N/A:N/A] Bleeding: [1:Pressure] [N/A:N/A] Hemostasis A chieved: [1:0] [N/A:N/A] Procedural Pain: [1:0] [N/A:N/A] Post Procedural Pain: [1:Procedure was tolerated well] [N/A:N/A] Debridement Treatment Response: [1:9.5x9x0.1] [N/A:N/A] Post Debridement Measurements L x W x D (cm) [1:6.715] [N/A:N/A] Post Debridement Volume: (cm) [1:Debridement] [N/A:N/A] Procedures Performed: Treatment Notes Electronic Signature(s) Signed: 01/30/2021  11:13:12 AM By: Kalman Shan DO Signed: 02/03/2021 6:08:57 PM By: Rhae Hammock RN Entered By: Kalman Shan on 01/30/2021 10:48:46 -------------------------------------------------------------------------------- Multi-Disciplinary Care Plan Details Patient Name: Date of Service: [name], [name] 01/30/2021 9:00 A M Medical Record Number: [id number] Patient Account Number: [id number] Date of Birth/Sex: Treating RN: [birth year] (84 y.o. Male) Rhae Hammock Primary Care Hester Joslin: [name] Other Clinician: Referring Omare Bilotta: Treating Randye Treichler/Extender: [name] in Treatment: 0 Active Inactive Orientation to the Wound Care Program Nursing Diagnoses: Knowledge deficit related to the wound healing center program Goals: Patient/caregiver will verbalize understanding of the [place] Program Date Initiated: 01/30/2021 Target Resolution Date: 02/21/2021 Goal Status: Active Interventions: Provide education on orientation to the wound center Notes: Wound/Skin Impairment Nursing Diagnoses: Impaired tissue integrity Knowledge deficit related to ulceration/compromised skin integrity Goals: Patient will have a decrease in wound volume by X% from date: (specify in notes) Date Initiated: 01/30/2021 Target Resolution Date: 02/21/2021 Goal Status: Active Patient/caregiver will verbalize understanding of skin care regimen Date Initiated: 01/30/2021 Target Resolution Date: 02/22/2021 Goal Status: Active Ulcer/skin breakdown will have a volume reduction of 30% by week 4 Date Initiated: 01/30/2021 Target Resolution Date: 02/22/2021 Goal Status: Active Interventions: Assess patient/caregiver ability to obtain necessary supplies Assess patient/caregiver ability to perform ulcer/skin care regimen upon admission and as needed Assess ulceration(s) every visit Notes: Electronic Signature(s) Signed: 02/03/2021 6:08:57 PM By: Rhae Hammock RN Entered By: Rhae Hammock on 01/30/2021 10:18:50 -------------------------------------------------------------------------------- Pain Assessment Details Patient Name: Date of Service: [name], [name] 01/30/2021 9:00 A M Medical Record Number: [id number] Patient Account Number: [id number] Date of Birth/Sex: Treating RN: 1936/04/20 (84 y.o. Male) Lorrin Jackson Primary Care Klair Leising: [name] Other Clinician: Referring Ly Bacchi: Treating Tarrie Mcmichen/Extender: [name]: 0 Active Problems Location of Pain Severity and Description of Pain Patient Has Paino  Yes Site Locations Duration of the Pain. Constant / Intermittento Intermittent Rate the pain. Current Pain Level: 2 Worst Pain Level: 8 Character of Pain Describe the Pain: Aching Pain Management and Medication Current Pain Management: Medication: Yes Cold Application: No Rest: Yes Massage: No Activity: No T.E.N.S.: No Heat Application: No Leg drop or elevation: No Is the Current Pain Management Adequate: Inadequate How does your wound impact your activities of daily livingo Sleep: No Bathing: No Appetite: No Relationship With Others: No Bladder Continence: No Emotions: No Bowel Continence: No Work: No Toileting: No Drive: No Dressing: No Hobbies: No Electronic Signature(s) Signed: 01/30/2021 4:36:36 PM By: Lorrin Jackson Entered By: Lorrin Jackson on 01/30/2021 10:12:48 -------------------------------------------------------------------------------- Patient/Caregiver Education Details Patient Name: Date of Service: [name], [name] 5/12/2022andnbsp9:00 A M Medical Record Number: [id number] Patient Account Number: [id number] Date of Birth/Gender: Treating RN: [birth year] (85 y.o. Male) Rhae Hammock Primary Care Physician: [name] Other Clinician: Referring Physician: Treating Physician/Extender: [name] in Treatment: 0 Education Assessment Education Provided To: Patient Education Topics Provided Welcome T [facility]: o Methods: Explain/Verbal Responses: State content correctly Electronic Signature(s) Signed: 02/03/2021 6:08:57 PM By: Rhae Hammock RN Entered By: Rhae Hammock on 01/30/2021 10:19:00 -------------------------------------------------------------------------------- Wound Assessment Details Patient Name: Date of Service: [name], [name] 01/30/2021 9:00 A M Medical Record Number: [id number] Patient Account Number: [id number] Date of Birth/Sex: Treating RN: 09-Oct-1935 (84 y.o. Male)  Lorrin Jackson Primary Care Lashala Laser: [name] Other Clinician: Referring Joas Motton: Treating Kimsey Demaree/Extender: [name]: 0 Wound Status Wound Number: 1 Primary Venous Leg Ulcer  Etiology: Wound Location: Right, Medial Lower Leg Wound Open Wounding Event: Gradually Appeared Status: Date Acquired: 07/02/2020 Comorbid Glaucoma, Coronary Artery Disease, Hypertension, Peripheral Weeks Of Treatment: 0 History: Venous Disease, Osteoarthritis Clustered Wound: No Photos Wound Measurements Length: (cm) 9.5 Width: (cm) 9 Depth: (cm) 0.1 Area: (cm) 67.152 Volume: (cm) 6.715 % Reduction in Area: 0% % Reduction in Volume: 0% Epithelialization: None Tunneling: No Undermining: No Wound Description Classification: Full Thickness Without Exposed Support Structures Wound Margin: Distinct, outline attached Exudate Amount: Medium Exudate Type: Serosanguineous Exudate Color: red, brown Wound Bed Granulation Amount: Small (1-33%) Granulation Quality: Pink Necrotic Amount: Large (67-100%) Necrotic Quality: Adherent Slough Foul Odor After Cleansing: No Slough/Fibrino Yes Exposed Structure Fascia Exposed: No Fat Layer (Subcutaneous Tissue) Exposed: Yes Tendon Exposed: No Muscle Exposed: No Joint Exposed: No Bone Exposed: No Treatment Notes Wound #1 (Lower Leg) Wound Laterality: Right, Medial Cleanser Wound Cleanser Discharge Instruction: Cleanse the wound with wound cleanser prior to applying a clean dressing using gauze sponges, not tissue or cotton balls. Soap and Water Discharge Instruction: May shower and wash wound with dial antibacterial soap and water prior to dressing change. Peri-Wound Care Sween Lotion (Moisturizing lotion) Discharge Instruction: Apply moisturizing lotion as directed Topical Primary Dressing Santyl Ointment Discharge Instruction: Apply nickel thick amount to wound bed as instructed Hydrofera Blue Classic Foam,  2x2 in Discharge Instruction: Moisten with saline prior to applying to wound bed Secondary Dressing Woven Gauze Sponge, Non-Sterile 4x4 in Discharge Instruction: Apply over primary dressing as directed. ABD Pad, 5x9 Discharge Instruction: Apply over primary dressing as directed. Secured With Compression Wrap ThreePress (3 layer compression wrap) Discharge Instruction: Apply three layer compression as directed. Compression Stockings Circaid Juxta Lite Compression Wrap Quantity: 1 Left Leg Compression Amount: 20-30 mmHg Right Leg Compression Amount: 20-30 mmHg Discharge Instruction: Apply Circaid Juxta Lite Compression Wrap daily as instructed. Apply first thing in the morning, remove at night before bed. Add-Ons Electronic Signature(s) Signed: 01/30/2021 3:37:13 PM By: Sandre Kitty Signed: 01/30/2021 4:36:36 PM By: Lorrin Jackson Entered By: Sandre Kitty on 01/30/2021 15:03:53 -------------------------------------------------------------------------------- Vitals Details Patient Name: Date of Service: [name], [name] 01/30/2021 9:00 A M Medical Record Number: [id number] Patient Account Number: [id number] Date of Birth/Sex: Treating RN: 1936/06/11 (84 y.o. Male) Rhae Hammock Primary Care Aina Rossbach: [name] Other Clinician: Referring Katriona Schmierer: Treating Tiziana Cislo/Extender: [name]: 0 Vital Signs Time Taken: 09:49 Temperature (F): 97.6 Pulse (bpm): 60 Respiratory Rate (breaths/min): 18 Blood Pressure (mmHg): 122/70 Reference Range: 80 - 120 mg / dl Electronic Signature(s) Signed: 01/30/2021 10:33:10 AM By: Sandre Kitty Entered By: Sandre Kitty on 01/30/2021 09:50:07

## 2021-02-04 ENCOUNTER — Other Ambulatory Visit: Payer: Self-pay | Admitting: Internal Medicine

## 2021-02-06 ENCOUNTER — Other Ambulatory Visit: Payer: Self-pay

## 2021-02-06 ENCOUNTER — Encounter (HOSPITAL_BASED_OUTPATIENT_CLINIC_OR_DEPARTMENT_OTHER): Payer: Medicare Other | Admitting: Internal Medicine

## 2021-02-06 DIAGNOSIS — I2510 Atherosclerotic heart disease of native coronary artery without angina pectoris: Secondary | ICD-10-CM | POA: Diagnosis not present

## 2021-02-06 DIAGNOSIS — I480 Paroxysmal atrial fibrillation: Secondary | ICD-10-CM | POA: Diagnosis not present

## 2021-02-06 DIAGNOSIS — I872 Venous insufficiency (chronic) (peripheral): Secondary | ICD-10-CM | POA: Diagnosis not present

## 2021-02-06 DIAGNOSIS — Z96651 Presence of right artificial knee joint: Secondary | ICD-10-CM | POA: Diagnosis not present

## 2021-02-06 DIAGNOSIS — Z87891 Personal history of nicotine dependence: Secondary | ICD-10-CM | POA: Diagnosis not present

## 2021-02-06 DIAGNOSIS — I10 Essential (primary) hypertension: Secondary | ICD-10-CM | POA: Diagnosis not present

## 2021-02-06 DIAGNOSIS — Z7901 Long term (current) use of anticoagulants: Secondary | ICD-10-CM | POA: Diagnosis not present

## 2021-02-06 DIAGNOSIS — L97819 Non-pressure chronic ulcer of other part of right lower leg with unspecified severity: Secondary | ICD-10-CM | POA: Diagnosis not present

## 2021-02-06 DIAGNOSIS — Z96643 Presence of artificial hip joint, bilateral: Secondary | ICD-10-CM | POA: Diagnosis not present

## 2021-02-06 DIAGNOSIS — Z79899 Other long term (current) drug therapy: Secondary | ICD-10-CM | POA: Diagnosis not present

## 2021-02-06 DIAGNOSIS — Z955 Presence of coronary angioplasty implant and graft: Secondary | ICD-10-CM | POA: Diagnosis not present

## 2021-02-06 DIAGNOSIS — L97812 Non-pressure chronic ulcer of other part of right lower leg with fat layer exposed: Secondary | ICD-10-CM | POA: Diagnosis not present

## 2021-02-06 NOTE — Progress Notes (Signed)
[name], [name] ([id number]) Visit Report for 02/06/2021 HPI Details Patient Name: Date of Service: [name], [name] [name] 02/06/2021 10:15 A M Medical Record Number: [id number] Patient Account Number: [id number] Date of Birth/Sex: Treating RN: 12/11/1935 (85 y.o. [name] Primary Care Provider: [name] Other Clinician: Referring Provider: Treating Provider/Extender: Carlyle Basques in Treatment: 1 History of Present Illness HPI Description: [name] is an 85 year old male with a past medical history of CAD s/p DES to LCx 2006, carotid artery disease, hypertension, hyperlipidemia, paroxysmal atrial fibrillation on Eliquis and Cardizem that presents today for right lower extremity wound. He was seen by Dr. Larose Kells, his primary care provider on 4/29 for this issue and was referred to our clinic. Patient states that he had a small wound that spontaneously started in October 2021 and has not healed. He states this has progressively gotten larger. He has been using acne cream prescribed by the dermatologist for this issue. He reports drainage to the wound but no purulent drainage. He reports mild soreness to the wound. He has swelling in his right leg greater than left and reports this is a chronic issue for the past 1 to 2 years. He denies resting leg pain or pain with ambulation. Of note He was prescribed doxycycline at the end of last month and has finished this course for possible skin infection to the wound site. 02/06/2021; I am seeing this patient who was admitted to the clinic last week by Dr. Heber [place]. He has predominantly I think a venous insufficiency wound on the right medial lower leg he has been using Santyl Hydrofera Blue under 3 layer compression. Arterial studies were ordered last week but do not seem to been put through. He is not a diabetic. He did have venous reflux studies done in August 2020. He did have abnormal reflux time was noted in the great saphenous vein  in the distal thigh and the great saphenous vein at the knee. There was no evidence of DVT or SVT at the time he was not felt to have large enough saphenous veins on either side for intervention. Compression stockings at least knee-high were recommended. Follow-up was on a as needed basis with Dr. Donzetta Matters The patient does not describe claudication but his pulses in his feet are not vibrant this could be because of the swelling Electronic Signature(s) Signed: 02/06/2021 4:52:14 PM By: Linton Ham MD Entered By: Linton Ham on 02/06/2021 11:28:00 -------------------------------------------------------------------------------- Physical Exam Details Patient Name: Date of Service: [name], [name] [name] 02/06/2021 10:15 A M Medical Record Number: [id number] Patient Account Number: [id number] Date of Birth/Sex: Treating RN: May 11, 1936 (85 y.o. [name] Primary Care Provider: [name] Other Clinician: Referring Provider: Treating Provider/Extender: Carlyle Basques in Treatment: 1 Constitutional Patient is hypertensive.. Pulse regular and within target range for patient.Marland Kitchen Respirations regular, non-labored and within target range.. Temperature is normal and within the target range for the patient.Marland Kitchen Appears in no distress. Cardiovascular Pedal pulses were palpable on the right but reduced. Popliteal pulse was easily palpable. Notes Wound exam; right lower extremity medially. This is a large but superficial ulceration. Most of the surface of this appears to have cleaned up. No mechanical debridement was necessary. He has pitting edema on both sides this is no doubt a chronic venous insufficiency wound. There was no evidence of infection Electronic Signature(s) Signed: 02/06/2021 4:52:14 PM By: Linton Ham MD Entered By: Linton Ham on 02/06/2021 11:29:51 -------------------------------------------------------------------------------- Physician Orders Details Patient  Name: Date of Service: [name], [name] [name] 02/06/2021 10:15 A M Medical Record Number: [id number] Patient Account Number: [id number] Date of Birth/Sex: Treating RN: 1936-04-17 (85 y.o. [name] Primary Care Provider: [name] Other Clinician: Referring Provider: Treating Provider/Extender: Carlyle Basques in Treatment: 1 Verbal / Phone Orders: No Diagnosis Coding ICD-10 Coding Code Description L97.819 Non-pressure chronic ulcer of other part of right lower leg with unspecified severity I87.2 Venous insufficiency (chronic) (peripheral) I25.10 Atherosclerotic heart disease of native coronary artery without angina pectoris I48.0 Paroxysmal atrial fibrillation Z79.01 Long term (current) use of anticoagulants Follow-up Appointments ppointment in 1 week. - with Dr. Heber [place] Return A Bathing/ Shower/ Hygiene May shower with protection but do not get wound dressing(s) wet. - May use cast protectors to cover your wrap...you can get those at [facility] or [facility]. Edema Control - Lymphedema / SCD / Other Elevate legs to the level of the heart or above for 30 minutes daily and/or when sitting, a frequency of: - 3-4 times a throughout the day. Avoid standing for long periods of time. Exercise regularly Moisturize legs daily. - lotion left leg every night before bed. Compression stocking or Garment 30-40 mm/Hg pressure to: - We will order juxtalite compression stockings in the future for bilateral lower legs. Wound Treatment Wound #1 - Lower Leg Wound Laterality: Right, Medial Cleanser: Soap and Water 1 x Per Week/15 Days Discharge Instructions: May shower and wash wound with dial antibacterial soap and water prior to dressing change. Cleanser: Wound Cleanser 1 x Per Week/15 Days Discharge Instructions: Cleanse the wound with wound cleanser prior to applying a clean dressing using gauze sponges, not tissue or cotton balls. Peri-Wound Care: Triamcinolone 15 (g) 1 x Per  Week/15 Days Discharge Instructions: ****Liberally****Use triamcinolone 15 (g) as directed Peri-Wound Care: Sween Lotion (Moisturizing lotion) 1 x Per Week/15 Days Discharge Instructions: Apply moisturizing lotion as directed Prim Dressing: Hydrofera Blue Classic Foam, 2x2 in 1 x Per Week/15 Days ary Discharge Instructions: Moisten with saline prior to applying to wound bed Secondary Dressing: Woven Gauze Sponge, Non-Sterile 4x4 in 1 x Per Week/15 Days Discharge Instructions: Apply over primary dressing as directed. Secondary Dressing: ABD Pad, 5x9 1 x Per Week/15 Days Discharge Instructions: Apply over primary dressing as directed. Compression Wrap: ThreePress (3 layer compression wrap) 1 x Per Week/15 Days Discharge Instructions: Apply three layer compression as directed. Compression Stockings: Circaid Juxta Lite Compression Wrap (DME) Left Leg Compression Amount: 20-30 mmHG Right Leg Compression Amount: 20-30 mmHG Discharge Instructions: Apply Circaid Juxta Lite Compression Wrap daily as instructed. Apply first thing in the morning, remove at night before bed. Services and Therapies rterial Studies with TBIs- Bilateral - Vein and Vascular arterial studies with ABIs and TBIs with arterial dopplers bilateral lower legs. A Electronic Signature(s) Signed: 02/06/2021 4:52:14 PM By: Linton Ham MD Signed: 02/06/2021 6:00:49 PM By: [name] Entered By: [name] on 02/06/2021 11:20:08 -------------------------------------------------------------------------------- Problem List Details Patient Name: Date of Service: [name], [name] [name] 02/06/2021 10:15 A M Medical Record Number: [id number] Patient Account Number: [id number] Date of Birth/Sex: Treating RN: 11-14-1935 (85 y.o. [name] Primary Care Provider: [name] Other Clinician: Referring Provider: Treating Provider/Extender: Carlyle Basques in Treatment: 1 Active Problems ICD-10 Encounter Code  Description Active Date MDM Diagnosis L97.819 Non-pressure chronic ulcer of other part of right lower leg with unspecified 01/30/2021 No Yes severity I87.2 Venous insufficiency (chronic) (peripheral) 01/30/2021 No Yes I25.10 Atherosclerotic heart disease of native coronary  artery without angina pectoris 01/30/2021 No Yes I48.0 Paroxysmal atrial fibrillation 01/30/2021 No Yes Z79.01 Long term (current) use of anticoagulants 01/30/2021 No Yes Inactive Problems Resolved Problems Electronic Signature(s) Signed: 02/06/2021 4:52:14 PM By: Linton Ham MD Entered By: Linton Ham on 02/06/2021 11:22:37 -------------------------------------------------------------------------------- Progress Note Details Patient Name: Date of Service: [name], [name] [name] 02/06/2021 10:15 A M Medical Record Number: [id number] Patient Account Number: [id number] Date of Birth/Sex: Treating RN: 1936-04-08 (85 y.o. [name] Primary Care Provider: [name] Other Clinician: Referring Provider: Treating Provider/Extender: Carlyle Basques in Treatment: 1 Subjective History of Present Illness (HPI) [name] is an 85 year old male with a past medical history of CAD s/p DES to LCx 2006, carotid artery disease, hypertension, hyperlipidemia, paroxysmal atrial fibrillation on Eliquis and Cardizem that presents today for right lower extremity wound. He was seen by Dr. Larose Kells, his primary care provider on 4/29 for this issue and was referred to our clinic. Patient states that he had a small wound that spontaneously started in October 2021 and has not healed. He states this has progressively gotten larger. He has been using acne cream prescribed by the dermatologist for this issue. He reports drainage to the wound but no purulent drainage. He reports mild soreness to the wound. He has swelling in his right leg greater than left and reports this is a chronic issue for the past 1 to 2 years. He denies  resting leg pain or pain with ambulation. Of note He was prescribed doxycycline at the end of last month and has finished this course for possible skin infection to the wound site. 02/06/2021; I am seeing this patient who was admitted to the clinic last week by Dr. Heber [place]. He has predominantly I think a venous insufficiency wound on the right medial lower leg he has been using Santyl Hydrofera Blue under 3 layer compression. Arterial studies were ordered last week but do not seem to been put through. He is not a diabetic. He did have venous reflux studies done in August 2020. He did have abnormal reflux time was noted in the great saphenous vein in the distal thigh and the great saphenous vein at the knee. There was no evidence of DVT or SVT at the time he was not felt to have large enough saphenous veins on either side for intervention. Compression stockings at least knee-high were recommended. Follow-up was on a as needed basis with Dr. Donzetta Matters The patient does not describe claudication but his pulses in his feet are not vibrant this could be because of the swelling Objective Constitutional Patient is hypertensive.. Pulse regular and within target range for patient.Marland Kitchen Respirations regular, non-labored and within target range.. Temperature is normal and within the target range for the patient.Marland Kitchen Appears in no distress. Vitals Time Taken: 10:28 AM, Temperature: 98.5 F, Pulse: 63 bpm, Respiratory Rate: 18 breaths/min, Blood Pressure: 151/88 mmHg. Cardiovascular Pedal pulses were palpable on the right but reduced. Popliteal pulse was easily palpable. General Notes: Wound exam; right lower extremity medially. This is a large but superficial ulceration. Most of the surface of this appears to have cleaned up. No mechanical debridement was necessary. He has pitting edema on both sides this is no doubt a chronic venous insufficiency wound. There was no evidence of infection Integumentary (Hair, Skin) Wound  #1 status is Open. Original cause of wound was Gradually Appeared. The date acquired was: 07/02/2020. The wound has been in treatment 1 weeks. The wound is located on the Right,Medial  Lower Leg. The wound measures 9cm length x 9.5cm width x 0.1cm depth; 67.152cm^2 area and 6.715cm^3 volume. There is Fat Layer (Subcutaneous Tissue) exposed. There is no tunneling or undermining noted. There is a medium amount of serosanguineous drainage noted. The wound margin is distinct with the outline attached to the wound base. There is large (67-100%) pink granulation within the wound bed. There is a small (1- 33%) amount of necrotic tissue within the wound bed including Adherent Slough. Assessment Active Problems ICD-10 Non-pressure chronic ulcer of other part of right lower leg with unspecified severity Venous insufficiency (chronic) (peripheral) Atherosclerotic heart disease of native coronary artery without angina pectoris Paroxysmal atrial fibrillation Long term (current) use of anticoagulants Procedures Wound #1 Pre-procedure diagnosis of Wound #1 is a Venous Leg Ulcer located on the Right,Medial Lower Leg . There was a Three Layer Compression Therapy Procedure by Baruch Gouty, RN. Post procedure Diagnosis Wound #1: Same as Pre-Procedure Plan Follow-up Appointments: Return Appointment in 1 week. - with Dr. Heber [place] Bathing/ Shower/ Hygiene: May shower with protection but do not get wound dressing(s) wet. - May use cast protectors to cover your wrap...you can get those at [facility] or [facility]. Edema Control - Lymphedema / SCD / Other: Elevate legs to the level of the heart or above for 30 minutes daily and/or when sitting, a frequency of: - 3-4 times a throughout the day. Avoid standing for long periods of time. Exercise regularly Moisturize legs daily. - lotion left leg every night before bed. Compression stocking or Garment 30-40 mm/Hg pressure to: - We will order juxtalite compression stockings  in the future for bilateral lower legs. Services and Therapies ordered were: Arterial Studies with TBIs- Bilateral - Vein and Vascular arterial studies with ABIs and TBIs with arterial dopplers bilateral lower legs. WOUND #1: - Lower Leg Wound Laterality: Right, Medial Cleanser: Soap and Water 1 x Per Week/15 Days Discharge Instructions: May shower and wash wound with dial antibacterial soap and water prior to dressing change. Cleanser: Wound Cleanser 1 x Per Week/15 Days Discharge Instructions: Cleanse the wound with wound cleanser prior to applying a clean dressing using gauze sponges, not tissue or cotton balls. Peri-Wound Care: Triamcinolone 15 (g) 1 x Per Week/15 Days Discharge Instructions: ****Liberally****Use triamcinolone 15 (g) as directed Peri-Wound Care: Sween Lotion (Moisturizing lotion) 1 x Per Week/15 Days Discharge Instructions: Apply moisturizing lotion as directed Prim Dressing: Hydrofera Blue Classic Foam, 2x2 in 1 x Per Week/15 Days ary Discharge Instructions: Moisten with saline prior to applying to wound bed Secondary Dressing: Woven Gauze Sponge, Non-Sterile 4x4 in 1 x Per Week/15 Days Discharge Instructions: Apply over primary dressing as directed. Secondary Dressing: ABD Pad, 5x9 1 x Per Week/15 Days Discharge Instructions: Apply over primary dressing as directed. Com pression Wrap: ThreePress (3 layer compression wrap) 1 x Per Week/15 Days Discharge Instructions: Apply three layer compression as directed. Com pression Stockings: Circaid Juxta Lite Compression Wrap (DME) Compression Amount: 20-30 mmHg (left) Compression Amount: 20-30 mmHg (right) Discharge Instructions: Apply Circaid Juxta Lite Compression Wrap daily as instructed. Apply first thing in the morning, remove at night before bed. 1. Patient with known chronic venous insufficiency. This is no doubt a chronic venous insufficiency wound. We have been using 3 layer compression with Hydrofera Blue and  Santyl. I do not think Santyl will be necessary but we will continue with the Hydrofera Blue 2. His peripheral pulses are palpable but somewhat reduced in his foot I do not disagree with the arterial studies which  will include ABIs TBI's and arterial Dopplers. 3. We will see how this does with a 3 layer compression. He is going to need bilateral juxta lite stockings. If all of this fails he is going to need to have his venous reflux studies repeated to see if anything can be done with the greater saphenous vein on the right 4. I see no evidence of infection Electronic Signature(s) Signed: 02/06/2021 4:52:14 PM By: Linton Ham MD Entered By: Linton Ham on 02/06/2021 11:31:13 -------------------------------------------------------------------------------- SuperBill Details Patient Name: Date of Service: [name], [name] [name] 02/06/2021 Medical Record Number: [id number] Patient Account Number: [id number] Date of Birth/Sex: Treating RN: Jun 01, 1936 (85 y.o. Lorette Ang, Meta.Reding Primary Care Provider: [name] Other Clinician: Referring Provider: Treating Provider/Extender: Carlyle Basques in Treatment: 1 Diagnosis Coding ICD-10 Codes Code Description [phone] Non-pressure chronic ulcer of other part of right lower leg with unspecified severity I87.2 Venous insufficiency (chronic) (peripheral) I25.10 Atherosclerotic heart disease of native coronary artery without angina pectoris I48.0 Paroxysmal atrial fibrillation Z79.01 Long term (current) use of anticoagulants Facility Procedures CPT4 Code: [id number] Description: (Facility Use Only) [phone] - [place] LWR RT LEG Modifier: Quantity: 1 Physician Procedures : CPT4 Code Description Modifier [id number] - WC PHYS LEVEL 3 - EST PT ICD-10 Diagnosis Description L97.819 Non-pressure chronic ulcer of other part of right lower leg with unspecified severity I87.2 Venous insufficiency (chronic) (peripheral) Quantity:  1 Electronic Signature(s) Signed: 02/06/2021 4:52:14 PM By: Linton Ham MD Entered By: Linton Ham on 02/06/2021 11:31:41

## 2021-02-06 NOTE — Progress Notes (Signed)
AYMEN, WIDRIG ([id number]) Visit Report for 02/06/2021 Arrival Information Details Patient Name: Date of Service: [name], Kennan RD E. 02/06/2021 10:15 A M Medical Record Number: [id number] Patient Account Number: [id number] Date of Birth/Sex: Treating RN: 1936/02/18 (85 y.o. [name] Primary Care Rocklin Soderquist: [name] Other Clinician: Referring Laurianne Floresca: Treating Nevea Spiewak/Extender: Carlyle Basques in Treatment: 1 Visit Information History Since Last Visit Added or deleted any medications: No Patient Arrived: Ambulatory Any new allergies or adverse reactions: No Arrival Time: 10:24 Had a fall or experienced change in No Accompanied By: wife activities of daily living that may affect Transfer Assistance: None risk of falls: Patient Requires Transmission-Based Precautions: No Signs or symptoms of abuse/neglect since last visito No Patient Has Alerts: Yes Hospitalized since last visit: No Patient Alerts: Patient on Blood Thinner Implantable device outside of the clinic excluding No cellular tissue based products placed in the center since last visit: Has Dressing in Place as Prescribed: Yes Has Compression in Place as Prescribed: Yes Pain Present Now: No Electronic Signature(s) Signed: 02/06/2021 5:39:50 PM By: Lorrin Jackson Entered By: Lorrin Jackson on 02/06/2021 10:28:49 -------------------------------------------------------------------------------- Compression Therapy Details Patient Name: Date of Service: [name], [name] 02/06/2021 10:15 A M Medical Record Number: [id number] Patient Account Number: [id number] Date of Birth/Sex: Treating RN: 10-Jul-1936 (85 y.o. [name] Primary Care Amier Hoyt: [name] Other Clinician: Referring Rori Goar: Treating Keyandra Swenson/Extender: Carlyle Basques in Treatment: 1 Compression Therapy Performed for Wound Assessment: Wound #1 Right,Medial Lower Leg Performed By: Clinician Baruch Gouty,  RN Compression Type: Three Layer Post Procedure Diagnosis Same as Pre-procedure Electronic Signature(s) Signed: 02/06/2021 6:00:49 PM By: [name] Entered By: [name] on 02/06/2021 11:18:19 -------------------------------------------------------------------------------- Encounter Discharge Information Details Patient Name: Date of Service: [name], [name] 02/06/2021 10:15 A M Medical Record Number: [id number] Patient Account Number: [id number] Date of Birth/Sex: Treating RN: January 18, 1936 (85 y.o. [name] Primary Care Alamin Mccuiston: [name] Other Clinician: Referring Kyeisha Janowicz: Treating Deyani Hegarty/Extender: Carlyle Basques in Treatment: 1 Encounter Discharge Information Items Discharge Condition: Stable Ambulatory Status: Cane Discharge Destination: Home Transportation: Private Auto Accompanied By: spouse Schedule Follow-up Appointment: Yes Clinical Summary of Care: Patient Declined Electronic Signature(s) Signed: 02/06/2021 5:19:49 PM By: Baruch Gouty RN, BSN Entered By: Baruch Gouty on 02/06/2021 11:45:08 -------------------------------------------------------------------------------- Lower Extremity Assessment Details Patient Name: Date of Service: [name], [name] 02/06/2021 10:15 A M Medical Record Number: [id number] Patient Account Number: [id number] Date of Birth/Sex: Treating RN: Mar 29, 1936 (85 y.o. [name] Primary Care Kellen Hover: [name] Other Clinician: Referring Youssouf Shipley: Treating Fronnie Urton/Extender: Carlyle Basques in Treatment: 1 Edema Assessment Assessed: [Left: No] [Right: No] Edema: [Left: Ye] [Right: s] Calf Left: Right: Point of Measurement: 31 cm From Medial Instep 39.6 cm Ankle Left: Right: Point of Measurement: 9 cm From Medial Instep 24 cm Vascular Assessment Pulses: Dorsalis Pedis Palpable: [Right:Yes] Electronic Signature(s) Signed: 02/06/2021 5:39:50 PM By: Lorrin Jackson Entered By: Lorrin Jackson on 02/06/2021 10:37:40 -------------------------------------------------------------------------------- Multi Wound Chart Details Patient Name: Date of Service: [name], [name] 02/06/2021 10:15 A M Medical Record Number: [id number] Patient Account Number: [id number] Date of Birth/Sex: Treating RN: Sep 16, 1936 (85 y.o. [name] Primary Care Kaelon Weekes: [name] Other Clinician: Referring Marceline Napierala: Treating Cherine Drumgoole/Extender: Carlyle Basques in Treatment: 1 Vital Signs Height(in): Pulse(bpm): 63 Weight(lbs): Blood Pressure(mmHg): 151/88 Body Mass Index(BMI): Temperature(F): 98.5 Respiratory Rate(breaths/min): 18 Photos: [1:No Photos Right, Medial Lower Leg] [N/A:N/A N/A]  Wound Location: [1:Gradually Appeared] [N/A:N/A] Wounding Event: [1:Venous Leg Ulcer] [N/A:N/A] Primary Etiology: [1:Glaucoma, Coronary Artery Disease, N/A] Comorbid History: [1:Hypertension, Peripheral Venous Disease, Osteoarthritis 07/02/2020] [N/A:N/A] Date Acquired: [1:1] [N/A:N/A] Weeks of Treatment: [1:Open] [N/A:N/A] Wound Status: [1:9x9.5x0.1] [N/A:N/A] Measurements L x W x D (cm) [1:67.152] [N/A:N/A] A (cm) : rea [1:6.715] [N/A:N/A] Volume (cm) : [1:0.00%] [N/A:N/A] % Reduction in Area: [1:0.00%] [N/A:N/A] % Reduction in Volume: [1:Full Thickness Without Exposed] [N/A:N/A] Classification: [1:Support Structures Medium] [N/A:N/A] Exudate Amount: [1:Serosanguineous] [N/A:N/A] Exudate Type: [1:red, brown] [N/A:N/A] Exudate Color: [1:Distinct, outline attached] [N/A:N/A] Wound Margin: [1:Large (67-100%)] [N/A:N/A] Granulation Amount: [1:Pink] [N/A:N/A] Granulation Quality: [1:Small (1-33%)] [N/A:N/A] Necrotic Amount: [1:Fat Layer (Subcutaneous Tissue): Yes N/A] Exposed Structures: [1:Fascia: No Tendon: No Muscle: No Joint: No Bone: No None] [N/A:N/A] Epithelialization: [1:Compression Therapy] [N/A:N/A] Treatment Notes Electronic  Signature(s) Signed: 02/06/2021 4:52:14 PM By: Linton Ham MD Signed: 02/06/2021 6:00:49 PM By: [name] Entered By: Linton Ham on 02/06/2021 11:24:31 -------------------------------------------------------------------------------- Multi-Disciplinary Care Plan Details Patient Name: Date of Service: [name], [name] 02/06/2021 10:15 A M Medical Record Number: [id number] Patient Account Number: [id number] Date of Birth/Sex: Treating RN: [birth year] (85 y.o. [name] Primary Care Tennile Styles: [name] Other Clinician: Referring Stormee Duda: Treating Cassandra Harbold/Extender: Opal Sidles, Ashley Murrain in Treatment: 1 Active Inactive Wound/Skin Impairment Nursing Diagnoses: Impaired tissue integrity Knowledge deficit related to ulceration/compromised skin integrity Goals: Patient will have a decrease in wound volume by X% from date: (specify in notes) Date Initiated: 01/30/2021 Target Resolution Date: 02/21/2021 Goal Status: Active Patient/caregiver will verbalize understanding of skin care regimen Date Initiated: 01/30/2021 Target Resolution Date: 02/22/2021 Goal Status: Active Ulcer/skin breakdown will have a volume reduction of 30% by week 4 Date Initiated: 01/30/2021 Target Resolution Date: 02/22/2021 Goal Status: Active Interventions: Assess patient/caregiver ability to obtain necessary supplies Assess patient/caregiver ability to perform ulcer/skin care regimen upon admission and as needed Assess ulceration(s) every visit Notes: Electronic Signature(s) Signed: 02/06/2021 6:00:49 PM By: [name] Entered By: [name] on 02/06/2021 11:08:23 -------------------------------------------------------------------------------- Pain Assessment Details Patient Name: Date of Service: [name], [name] 02/06/2021 10:15 A M Medical Record Number: [id number] Patient Account Number: [id number] Date of Birth/Sex: Treating RN: [birth year] (85 y.o. [name] Primary Care Lianni Kanaan: [name] Other Clinician: Referring Lesslie Mckeehan: Treating Taylorann Tkach/Extender: Carlyle Basques in Treatment: 1 Active Problems Location of Pain Severity and Description of Pain Patient Has Paino No Site Locations Pain Management and Medication Current Pain Management: Electronic Signature(s) Signed: 02/06/2021 5:39:50 PM By: Lorrin Jackson Entered By: Lorrin Jackson on 02/06/2021 10:29:31 -------------------------------------------------------------------------------- Patient/Caregiver Education Details Patient Name: Date of Service: [name], [name] 5/19/2022andnbsp10:15 A M Medical Record Number: [id number] Patient Account Number: [id number] Date of Birth/Gender: Treating RN: July 05, 1936 (85 y.o. [name] Primary Care Physician: [name] Other Clinician: Referring Physician: Treating Physician/Extender: Carlyle Basques in Treatment: 1 Education Assessment Education Provided To: Patient Education Topics Provided Wound/Skin Impairment: Handouts: Skin Care Do's and Dont's Methods: Explain/Verbal Responses: Reinforcements needed Electronic Signature(s) Signed: 02/06/2021 6:00:49 PM By: [name] Entered By: [name] on 02/06/2021 11:08:42 -------------------------------------------------------------------------------- Wound Assessment Details Patient Name: Date of Service: [name], [name] 02/06/2021 10:15 A M Medical Record Number: [id number] Patient Account Number: [id number] Date of Birth/Sex: Treating RN: 08-Oct-1935 (85 y.o. [name] Primary Care Cortasia Screws: [name] Other Clinician: Referring Saraih Lorton: Treating Jennalee Greaves/Extender: Carlyle Basques in Treatment: 1 Wound Status Wound Number: 1 Primary Venous Leg Ulcer Etiology: Wound Location:  Right, Medial Lower Leg Wound Open Wounding Event: Gradually Appeared Status: Date Acquired: 07/02/2020 Comorbid  Glaucoma, Coronary Artery Disease, Hypertension, Peripheral Weeks Of Treatment: 1 History: Venous Disease, Osteoarthritis Clustered Wound: No Photos Wound Measurements Length: (cm) 9 Width: (cm) 9.5 Depth: (cm) 0.1 Area: (cm) 67.152 Volume: (cm) 6.715 % Reduction in Area: 0% % Reduction in Volume: 0% Epithelialization: None Tunneling: No Undermining: No Wound Description Classification: Full Thickness Without Exposed Support Structures Wound Margin: Distinct, outline attached Exudate Amount: Medium Exudate Type: Serosanguineous Exudate Color: red, brown Foul Odor After Cleansing: No Slough/Fibrino Yes Wound Bed Granulation Amount: Large (67-100%) Exposed Structure Granulation Quality: Pink Fascia Exposed: No Necrotic Amount: Small (1-33%) Fat Layer (Subcutaneous Tissue) Exposed: Yes Necrotic Quality: Adherent Slough Tendon Exposed: No Muscle Exposed: No Joint Exposed: No Bone Exposed: No Treatment Notes Wound #1 (Lower Leg) Wound Laterality: Right, Medial Cleanser Wound Cleanser Discharge Instruction: Cleanse the wound with wound cleanser prior to applying a clean dressing using gauze sponges, not tissue or cotton balls. Soap and Water Discharge Instruction: May shower and wash wound with dial antibacterial soap and water prior to dressing change. Peri-Wound Care Triamcinolone 15 (g) Discharge Instruction: ****Liberally****Use triamcinolone 15 (g) as directed Sween Lotion (Moisturizing lotion) Discharge Instruction: Apply moisturizing lotion as directed Topical Primary Dressing Hydrofera Blue Classic Foam, 2x2 in Discharge Instruction: Moisten with saline prior to applying to wound bed Secondary Dressing Woven Gauze Sponge, Non-Sterile 4x4 in Discharge Instruction: Apply over primary dressing as directed. ABD Pad, 5x9 Discharge Instruction: Apply over primary dressing as directed. Secured With Compression Wrap ThreePress (3 layer compression  wrap) Discharge Instruction: Apply three layer compression as directed. Compression Stockings Circaid Juxta Lite Compression Wrap Quantity: 1 Left Leg Compression Amount: 20-30 mmHg Right Leg Compression Amount: 20-30 mmHg Discharge Instruction: Apply Circaid Juxta Lite Compression Wrap daily as instructed. Apply first thing in the morning, remove at night before bed. Add-Ons Electronic Signature(s) Signed: 02/06/2021 4:57:25 PM By: Sandre Kitty Signed: 02/06/2021 5:39:50 PM By: Lorrin Jackson Entered By: Sandre Kitty on 02/06/2021 16:49:23 -------------------------------------------------------------------------------- Vitals Details Patient Name: Date of Service: [name], [name] 02/06/2021 10:15 A M Medical Record Number: [id number] Patient Account Number: [id number] Date of Birth/Sex: Treating RN: May 27, 1936 (85 y.o. [name] Primary Care Ramzey Petrovic: [name] Other Clinician: Referring Alyss Granato: Treating Carter Kassel/Extender: Carlyle Basques in Treatment: 1 Vital Signs Time Taken: 10:28 Temperature (F): 98.5 Pulse (bpm): 63 Respiratory Rate (breaths/min): 18 Blood Pressure (mmHg): 151/88 Reference Range: 80 - 120 mg / dl Electronic Signature(s) Signed: 02/06/2021 5:39:50 PM By: Lorrin Jackson Entered By: Lorrin Jackson on 02/06/2021 10:29:18

## 2021-02-07 ENCOUNTER — Encounter (HOSPITAL_BASED_OUTPATIENT_CLINIC_OR_DEPARTMENT_OTHER): Payer: Medicare Other | Admitting: Internal Medicine

## 2021-02-07 DIAGNOSIS — Z7901 Long term (current) use of anticoagulants: Secondary | ICD-10-CM | POA: Diagnosis not present

## 2021-02-07 DIAGNOSIS — L97819 Non-pressure chronic ulcer of other part of right lower leg with unspecified severity: Secondary | ICD-10-CM | POA: Diagnosis not present

## 2021-02-12 ENCOUNTER — Encounter (HOSPITAL_BASED_OUTPATIENT_CLINIC_OR_DEPARTMENT_OTHER): Payer: Medicare Other | Admitting: Internal Medicine

## 2021-02-12 ENCOUNTER — Other Ambulatory Visit (HOSPITAL_COMMUNITY): Payer: Self-pay | Admitting: Internal Medicine

## 2021-02-12 DIAGNOSIS — L97509 Non-pressure chronic ulcer of other part of unspecified foot with unspecified severity: Secondary | ICD-10-CM

## 2021-02-13 ENCOUNTER — Ambulatory Visit (HOSPITAL_COMMUNITY)
Admission: RE | Admit: 2021-02-13 | Discharge: 2021-02-13 | Disposition: A | Discharge status: Home / Self Care | Payer: Medicare Other | Source: Ambulatory Visit | Attending: Internal Medicine | Admitting: Internal Medicine

## 2021-02-13 ENCOUNTER — Other Ambulatory Visit: Payer: Self-pay

## 2021-02-13 ENCOUNTER — Encounter (HOSPITAL_BASED_OUTPATIENT_CLINIC_OR_DEPARTMENT_OTHER): Payer: Medicare Other | Admitting: Internal Medicine

## 2021-02-13 ENCOUNTER — Ambulatory Visit (HOSPITAL_COMMUNITY): Admission: RE | Admit: 2021-02-13 | Payer: Medicare Other | Source: Ambulatory Visit

## 2021-02-13 DIAGNOSIS — I2510 Atherosclerotic heart disease of native coronary artery without angina pectoris: Secondary | ICD-10-CM | POA: Diagnosis not present

## 2021-02-13 DIAGNOSIS — I10 Essential (primary) hypertension: Secondary | ICD-10-CM | POA: Diagnosis not present

## 2021-02-13 DIAGNOSIS — L97509 Non-pressure chronic ulcer of other part of unspecified foot with unspecified severity: Secondary | ICD-10-CM | POA: Diagnosis not present

## 2021-02-13 DIAGNOSIS — L97819 Non-pressure chronic ulcer of other part of right lower leg with unspecified severity: Secondary | ICD-10-CM | POA: Diagnosis not present

## 2021-02-13 DIAGNOSIS — Z96643 Presence of artificial hip joint, bilateral: Secondary | ICD-10-CM | POA: Diagnosis not present

## 2021-02-13 DIAGNOSIS — I480 Paroxysmal atrial fibrillation: Secondary | ICD-10-CM | POA: Diagnosis not present

## 2021-02-13 DIAGNOSIS — I872 Venous insufficiency (chronic) (peripheral): Secondary | ICD-10-CM | POA: Diagnosis not present

## 2021-02-13 DIAGNOSIS — Z87891 Personal history of nicotine dependence: Secondary | ICD-10-CM | POA: Diagnosis not present

## 2021-02-13 DIAGNOSIS — Z79899 Other long term (current) drug therapy: Secondary | ICD-10-CM | POA: Diagnosis not present

## 2021-02-13 DIAGNOSIS — Z7901 Long term (current) use of anticoagulants: Secondary | ICD-10-CM | POA: Diagnosis not present

## 2021-02-13 DIAGNOSIS — Z955 Presence of coronary angioplasty implant and graft: Secondary | ICD-10-CM | POA: Diagnosis not present

## 2021-02-13 DIAGNOSIS — Z96651 Presence of right artificial knee joint: Secondary | ICD-10-CM | POA: Diagnosis not present

## 2021-02-13 NOTE — Progress Notes (Signed)
[name] ([id number]) Visit Report for 02/13/2021 Chief Complaint Document Details Patient Name: Date of Service: [name], [name] RD E. 02/13/2021 10:15 A M Medical Record Number: [id number] Patient Account Number: [id number] Date of Birth/Sex: Treating RN: 1936-03-31 (85 y.o. [name] Primary Care Provider: [name] Other Clinician: Referring Provider: Treating Provider/Extender: [name]: 2 Information Obtained from: Patient Chief Complaint Right lower extremity wound Electronic Signature(s) Signed: 02/13/2021 12:29:20 PM By: [name] Shan DO Entered By: [name] on 02/13/2021 12:22:56 -------------------------------------------------------------------------------- HPI Details Patient Name: Date of Service: [name], [name] RD E. 02/13/2021 10:15 A M Medical Record Number: [id number] Patient Account Number: [id number] Date of Birth/Sex: Treating RN: August 07, 1936 (85 y.o. [name] Primary Care Provider: [name] Other Clinician: Referring Provider: Treating Provider/Extender: [name]: 2 History of Present Illness HPI Description: [name] is an 85 year old male with a past medical history of CAD s/p DES to LCx 2006, carotid artery disease, hypertension, hyperlipidemia, paroxysmal atrial fibrillation on Eliquis and Cardizem that presents today for right lower extremity wound. He was seen by Dr. Larose Kells, his primary care provider on 4/29 for this issue and was referred to our clinic. Patient states that he had a small wound that spontaneously started in October 2021 and has not healed. He states this has progressively gotten larger. He has been using acne cream prescribed by the dermatologist for this issue. He reports drainage to the wound but no purulent drainage. He reports mild soreness to the wound. He has swelling in his right leg greater than left and reports this is a chronic issue  for the past 1 to 2 years. He denies resting leg pain or pain with ambulation. Of note He was prescribed doxycycline at the end of last month and has finished this course for possible skin infection to the wound site. 02/06/2021; I am seeing this patient who was admitted to the clinic last week by Dr. Heber [place]. He has predominantly I think a venous insufficiency wound on the right medial lower leg he has been using Santyl Hydrofera Blue under 3 layer compression. Arterial studies were ordered last week but do not seem to been put through. He is not a diabetic. He did have venous reflux studies done in August 2020. He did have abnormal reflux time was noted in the great saphenous vein in the distal thigh and the great saphenous vein at the knee. There was no evidence of DVT or SVT at the time he was not felt to have large enough saphenous veins on either side for intervention. Compression stockings at least knee-high were recommended. Follow-up was on a as needed basis with Dr. Donzetta Matters The patient does not describe claudication but his pulses in his feet are not vibrant this could be because of the swelling 5/26; patient presents for 1 week follow-up. He has been using Hydrofera Blue under 3 layer compression. He had arterial studies done. He has no issues or complaints today. He denies signs of infection. He has his juxta light compressions with him today. Electronic Signature(s) Signed: 02/13/2021 12:29:20 PM By: [name] Shan DO Entered By: [name] on 02/13/2021 12:23:43 -------------------------------------------------------------------------------- Physical Exam Details Patient Name: Date of Service: [name], [name] RD E. 02/13/2021 10:15 A M Medical Record Number: [id number] Patient Account Number: [id number] Date of Birth/Sex: Treating RN: 1936/09/19 (85 y.o. [name] Primary Care Provider: [name] Other Clinician: Referring Provider: Treating Provider/Extender: Freddi Starr  Weeks in Treatment: 2 Constitutional respirations regular, non-labored and within target range for patient.. Cardiovascular 2+ dorsalis pedis/posterior tibialis pulses. Psychiatric pleasant and cooperative. Notes Right lower extremity: There is a wound to the medial distal area. Mostly granulation tissue with some scattered necrotic debris. I mechanically debrided this. No signs of infection. Electronic Signature(s) Signed: 02/13/2021 12:29:20 PM By: [name] Shan DO Entered By: [name] on 02/13/2021 12:24:47 -------------------------------------------------------------------------------- Physician Orders Details Patient Name: Date of Service: [name], [name] RD E. 02/13/2021 10:15 A M Medical Record Number: [id number] Patient Account Number: [id number] Date of Birth/Sex: Treating RN: 1936/03/14 (85 y.o. [name] Primary Care Provider: [name] Other Clinician: Referring Provider: Treating Provider/Extender: [name]: 2 Verbal / Phone Orders: No Diagnosis Coding ICD-10 Coding Code Description L97.819 Non-pressure chronic ulcer of other part of right lower leg with unspecified severity I87.2 Venous insufficiency (chronic) (peripheral) I25.10 Atherosclerotic heart disease of native coronary artery without angina pectoris I48.0 Paroxysmal atrial fibrillation Z79.01 Long term (current) use of anticoagulants Follow-up Appointments ppointment in 1 week. - with Dr. Heber Palmer Return A Bathing/ Shower/ Hygiene May shower with protection but do not get wound dressing(s) wet. - May use cast protectors to cover your wrap...you can get those at [facility] or [facility]. Edema Control - Lymphedema / SCD / Other Elevate legs to the level of the heart or above for 30 minutes daily and/or when sitting, a frequency of: - 3-4 times a throughout the day. Avoid standing for long periods of time. Exercise regularly Moisturize legs  daily. - lotion left leg every night before bed. Compression stocking or Garment 30-40 mm/Hg pressure to: - keep one juxtalite compression garment in car bring in weekly for right leg. ****Wound center to apply and demonstrate how to apply juxtatlite to left leg. Patient to apply in the morning and remove at night to left leg. Wound Treatment Wound #1 - Lower Leg Wound Laterality: Right, Medial Cleanser: Soap and Water 1 x Per Week/15 Days Discharge Instructions: May shower and wash wound with dial antibacterial soap and water prior to dressing change. Cleanser: Wound Cleanser 1 x Per Week/15 Days Discharge Instructions: Cleanse the wound with wound cleanser prior to applying a clean dressing using gauze sponges, not tissue or cotton balls. Peri-Wound Care: Triamcinolone 15 (g) 1 x Per Week/15 Days Discharge Instructions: ****Liberally****Use triamcinolone 15 (g) as directed Peri-Wound Care: Sween Lotion (Moisturizing lotion) 1 x Per Week/15 Days Discharge Instructions: Apply moisturizing lotion as directed Prim Dressing: Hydrofera Blue Classic Foam, 2x2 in 1 x Per Week/15 Days ary Discharge Instructions: Moisten with saline prior to applying to wound bed Secondary Dressing: Woven Gauze Sponge, Non-Sterile 4x4 in 1 x Per Week/15 Days Discharge Instructions: Apply over primary dressing as directed. Secondary Dressing: ABD Pad, 5x9 1 x Per Week/15 Days Discharge Instructions: Apply over primary dressing as directed. Compression Wrap: ThreePress (3 layer compression wrap) 1 x Per Week/15 Days Discharge Instructions: Apply three layer compression as directed. Electronic Signature(s) Signed: 02/13/2021 12:29:20 PM By: [name] Shan DO Entered By: [name] on 02/13/2021 12:25:30 -------------------------------------------------------------------------------- Problem List Details Patient Name: Date of Service: [name], [name] RD E. 02/13/2021 10:15 A M Medical Record Number:  [id number] Patient Account Number: [id number] Date of Birth/Sex: Treating RN: [birth year] (85 y.o. [name] Primary Care Provider: [name] Other Clinician: Referring Provider: Treating Provider/Extender: [name]: 2 Active Problems ICD-10 Encounter Code Description Active Date MDM Diagnosis L97.819 Non-pressure chronic ulcer of  other part of right lower leg with unspecified 01/30/2021 No Yes severity I87.2 Venous insufficiency (chronic) (peripheral) 01/30/2021 No Yes I25.10 Atherosclerotic heart disease of native coronary artery without angina pectoris 01/30/2021 No Yes I48.0 Paroxysmal atrial fibrillation 01/30/2021 No Yes Z79.01 Long term (current) use of anticoagulants 01/30/2021 No Yes Inactive Problems Resolved Problems Electronic Signature(s) Signed: 02/13/2021 12:29:20 PM By: [name] Shan DO Entered By: [name] on 02/13/2021 12:22:42 -------------------------------------------------------------------------------- Progress Note Details Patient Name: Date of Service: [name], [name] RD E. 02/13/2021 10:15 A M Medical Record Number: [id number] Patient Account Number: [id number] Date of Birth/Sex: Treating RN: June 04, 1936 (85 y.o. [name] Primary Care Provider: [name] Other Clinician: Referring Provider: Treating Provider/Extender: [name]: 2 Subjective Chief Complaint Information obtained from Patient Right lower extremity wound History of Present Illness (HPI) [name] is an 85 year old male with a past medical history of CAD s/p DES to LCx 2006, carotid artery disease, hypertension, hyperlipidemia, paroxysmal atrial fibrillation on Eliquis and Cardizem that presents today for right lower extremity wound. He was seen by Dr. Larose Kells, his primary care provider on 4/29 for this issue and was referred to our clinic. Patient states that he had a small wound that spontaneously  started in October 2021 and has not healed. He states this has progressively gotten larger. He has been using acne cream prescribed by the dermatologist for this issue. He reports drainage to the wound but no purulent drainage. He reports mild soreness to the wound. He has swelling in his right leg greater than left and reports this is a chronic issue for the past 1 to 2 years. He denies resting leg pain or pain with ambulation. Of note He was prescribed doxycycline at the end of last month and has finished this course for possible skin infection to the wound site. 02/06/2021; I am seeing this patient who was admitted to the clinic last week by Dr. Heber [place]. He has predominantly I think a venous insufficiency wound on the right medial lower leg he has been using Santyl Hydrofera Blue under 3 layer compression. Arterial studies were ordered last week but do not seem to been put through. He is not a diabetic. He did have venous reflux studies done in August 2020. He did have abnormal reflux time was noted in the great saphenous vein in the distal thigh and the great saphenous vein at the knee. There was no evidence of DVT or SVT at the time he was not felt to have large enough saphenous veins on either side for intervention. Compression stockings at least knee-high were recommended. Follow-up was on a as needed basis with Dr. Donzetta Matters The patient does not describe claudication but his pulses in his feet are not vibrant this could be because of the swelling 5/26; patient presents for 1 week follow-up. He has been using Hydrofera Blue under 3 layer compression. He had arterial studies done. He has no issues or complaints today. He denies signs of infection. He has his juxta light compressions with him today. Patient History Information obtained from Patient. Family History Cancer - Father, Diabetes - Maternal Grandparents, Heart Disease - Maternal Grandparents, Hypertension - Maternal Grandparents, No family  history of Hereditary Spherocytosis, Kidney Disease, Lung Disease, Seizures, Stroke, Thyroid Problems, Tuberculosis. Social History Former smoker - Quit over 30 years ago, Marital Status - Married, Alcohol Use - Rarely, Drug Use - No History, Caffeine Use - Daily. Medical History Eyes Patient has history of Glaucoma Cardiovascular Patient has  history of Coronary Artery Disease, Hypertension, Peripheral Venous Disease Integumentary (Skin) Denies history of History of Burn Musculoskeletal Patient has history of Osteoarthritis Medical A Surgical History Notes nd Endocrine Hypothyroidism Genitourinary BPH Musculoskeletal Bilat Hip Replacements, Right Knee Replacement Oncologic Skin Cancer multiple sites Objective Constitutional respirations regular, non-labored and within target range for patient.. Vitals Time Taken: 10:56 AM, Temperature: 97.6 F, Pulse: 56 bpm, Respiratory Rate: 18 breaths/min, Blood Pressure: 138/57 mmHg. Cardiovascular 2+ dorsalis pedis/posterior tibialis pulses. Psychiatric pleasant and cooperative. General Notes: Right lower extremity: There is a wound to the medial distal area. Mostly granulation tissue with some scattered necrotic debris. I mechanically debrided this. No signs of infection. Integumentary (Hair, Skin) Wound #1 status is Open. Original cause of wound was Gradually Appeared. The date acquired was: 07/02/2020. The wound has been in treatment 2 weeks. The wound is located on the Right,Medial Lower Leg. The wound measures 7cm length x 10cm width x 0.1cm depth; 54.978cm^2 area and 5.498cm^3 volume. There is Fat Layer (Subcutaneous Tissue) exposed. There is no tunneling or undermining noted. There is a medium amount of serosanguineous drainage noted. The wound margin is distinct with the outline attached to the wound base. There is medium (34-66%) red, pink granulation within the wound bed. There is a medium (34-66%) amount of necrotic tissue within  the wound bed including Adherent Slough. Assessment Active Problems ICD-10 Non-pressure chronic ulcer of other part of right lower leg with unspecified severity Venous insufficiency (chronic) (peripheral) Atherosclerotic heart disease of native coronary artery without angina pectoris Paroxysmal atrial fibrillation Long term (current) use of anticoagulants His wound has shown improvement in appearance and size. No signs of infection. He had ABIs that were normal. He has been under 3 layer compression with Hydrofera Blue. I would like to continue this. He has juxta lite compressions today for both legs. We placed the left 1 today and we will place the right one once he is healed. I will see him in a week. I may add Santyl again to the [facility] if there is still necrotic debris. Procedures Wound #1 Pre-procedure diagnosis of Wound #1 is a Venous Leg Ulcer located on the Right,Medial Lower Leg . There was a Three Layer Compression Therapy Procedure by Baruch Gouty, RN. Post procedure Diagnosis Wound #1: Same as Pre-Procedure Plan Follow-up Appointments: Return Appointment in 1 week. - with Dr. Heber [place] Bathing/ Shower/ Hygiene: May shower with protection but do not get wound dressing(s) wet. - May use cast protectors to cover your wrap...you can get those at [facility] or [facility]. Edema Control - Lymphedema / SCD / Other: Elevate legs to the level of the heart or above for 30 minutes daily and/or when sitting, a frequency of: - 3-4 times a throughout the day. Avoid standing for long periods of time. Exercise regularly Moisturize legs daily. - lotion left leg every night before bed. Compression stocking or Garment 30-40 mm/Hg pressure to: - keep one juxtalite compression garment in car bring in weekly for right leg. ****Wound center to apply and demonstrate how to apply juxtatlite to left leg. Patient to apply in the morning and remove at night to left leg. WOUND #1: - Lower Leg Wound  Laterality: Right, Medial Cleanser: Soap and Water 1 x Per Week/15 Days Discharge Instructions: May shower and wash wound with dial antibacterial soap and water prior to dressing change. Cleanser: Wound Cleanser 1 x Per Week/15 Days Discharge Instructions: Cleanse the wound with wound cleanser prior to applying a clean dressing using gauze  sponges, not tissue or cotton balls. Peri-Wound Care: Triamcinolone 15 (g) 1 x Per Week/15 Days Discharge Instructions: ****Liberally****Use triamcinolone 15 (g) as directed Peri-Wound Care: Sween Lotion (Moisturizing lotion) 1 x Per Week/15 Days Discharge Instructions: Apply moisturizing lotion as directed Prim Dressing: Hydrofera Blue Classic Foam, 2x2 in 1 x Per Week/15 Days ary Discharge Instructions: Moisten with saline prior to applying to wound bed Secondary Dressing: Woven Gauze Sponge, Non-Sterile 4x4 in 1 x Per Week/15 Days Discharge Instructions: Apply over primary dressing as directed. Secondary Dressing: ABD Pad, 5x9 1 x Per Week/15 Days Discharge Instructions: Apply over primary dressing as directed. Com pression Wrap: ThreePress (3 layer compression wrap) 1 x Per Week/15 Days Discharge Instructions: Apply three layer compression as directed. 1. Hydrofera Blue under 3 layer compression 2. Follow-up in 1 week Electronic Signature(s) Signed: 02/13/2021 12:29:20 PM By: [name] Shan DO Entered By: [name] on 02/13/2021 12:27:20 -------------------------------------------------------------------------------- HxROS Details Patient Name: Date of Service: [name], [name] RD E. 02/13/2021 10:15 A M Medical Record Number: [id number] Patient Account Number: [id number] Date of Birth/Sex: Treating RN: 10-24-1935 (85 y.o. [name] Primary Care Provider: [name] Other Clinician: Referring Provider: Treating Provider/Extender: [name]: 2 Information Obtained From Patient Eyes Medical  History: Positive for: Glaucoma Cardiovascular Medical History: Positive for: Coronary Artery Disease; Hypertension; Peripheral Venous Disease Endocrine Medical History: Past Medical History Notes: Hypothyroidism Genitourinary Medical History: Past Medical History Notes: BPH Integumentary (Skin) Medical History: Negative for: History of Burn Musculoskeletal Medical History: Positive for: Osteoarthritis Past Medical History Notes: Bilat Hip Replacements, Right Knee Replacement Oncologic Medical History: Past Medical History Notes: Skin Cancer multiple sites HBO Extended History Items Eyes: Glaucoma Immunizations Pneumococcal Vaccine: Received Pneumococcal Vaccination: No Implantable Devices None Family and Social History Cancer: Yes - Father; Diabetes: Yes - Maternal Grandparents; Heart Disease: Yes - Maternal Grandparents; Hereditary Spherocytosis: No; Hypertension: Yes - Maternal Grandparents; Kidney Disease: No; Lung Disease: No; Seizures: No; Stroke: No; Thyroid Problems: No; Tuberculosis: No; Former smoker - Quit over 30 years ago; Marital Status - Married; Alcohol Use: Rarely; Drug Use: No History; Caffeine Use: Daily; Financial Concerns: No; Food, Clothing or Shelter Needs: No; Support System Lacking: No; Transportation Concerns: No Electronic Signature(s) Signed: 02/13/2021 12:29:20 PM By: [name] Shan DO Signed: 02/13/2021 6:02:39 PM By: [name] Entered By: [name] on 02/13/2021 12:23:51 -------------------------------------------------------------------------------- SuperBill Details Patient Name: Date of Service: [name], [name] RD E. 02/13/2021 Medical Record Number: [id number] Patient Account Number: [id number] Date of Birth/Sex: Treating RN: Nov 03, 1935 (85 y.o. Lorette Ang, Meta.Reding Primary Care Provider: [name] Other Clinician: Referring Provider: Treating Provider/Extender: [name]: 2 Diagnosis  Coding ICD-10 Codes Code Description [phone] Non-pressure chronic ulcer of other part of right lower leg with unspecified severity I87.2 Venous insufficiency (chronic) (peripheral) I25.10 Atherosclerotic heart disease of native coronary artery without angina pectoris I48.0 Paroxysmal atrial fibrillation Z79.01 Long term (current) use of anticoagulants Facility Procedures CPT4 Code: [id number] Description: (Facility Use Only) [id number] - [place] RT LEG Modifier: Quantity: 1 Physician Procedures Electronic Signature(s) Signed: 02/13/2021 12:29:20 PM By: [name] Shan DO Entered By: [name] on 02/13/2021 12:28:18

## 2021-02-18 NOTE — Progress Notes (Signed)
[name] ([id number]) Visit Report for 02/13/2021 Arrival Information Details Patient Name: Date of Service: [name], [place] RD E. 02/13/2021 10:15 A M Medical Record Number: [id number] Patient Account Number: [id number] Date of Birth/Sex: Treating [name]: 09-19-1936 (85 y.o. [name] Primary Care [name]: [name] Other Clinician: Referring [name]: Treating [name]/Extender: [name] in Treatment: 2 Visit Information History Since Last Visit Added or deleted any medications: No Patient Arrived: Ambulatory Any new allergies or adverse reactions: No Arrival Time: 10:55 Had a fall or experienced change in No Accompanied By: wife activities of daily living that may affect Transfer Assistance: None risk of falls: Patient Identification Verified: Yes Signs or symptoms of abuse/neglect since last visito No Secondary Verification Process Completed: Yes Hospitalized since last visit: No Patient Requires Transmission-Based Precautions: No Implantable device outside of the clinic excluding No Patient Has Alerts: Yes cellular tissue based products placed in the center Patient Alerts: Patient on Blood Thinner since last visit: Has Dressing in Place as Prescribed: Yes Pain Present Now: No Electronic Signature(s) Signed: 02/13/2021 4:57:49 PM By: [name] Entered By: [name] on 02/13/2021 10:56:06 -------------------------------------------------------------------------------- Compression Therapy Details Patient Name: Date of Service: [name], [name] 02/13/2021 10:15 A M Medical Record Number: [id number] Patient Account Number: [id number] Date of Birth/Sex: Treating [name]: [birth year] (85 y.o. [name] Primary Care [name]: [name] Other Clinician: Referring [name]: Treating [name]/Extender: [name] in Treatment: 2 Compression Therapy Performed for Wound Assessment: Wound #1 Right,Medial Lower  Leg Performed By: Clinician [name] Compression Type: Three Layer Post Procedure Diagnosis Same as Pre-procedure Electronic Signature(s) Signed: 02/13/2021 6:02:39 PM By: [name] Entered By: [name] on 02/13/2021 11:30:10 -------------------------------------------------------------------------------- Encounter Discharge Information Details Patient Name: Date of Service: [name], [name] 02/13/2021 10:15 A M Medical Record Number: [id number] Patient Account Number: [id number] Date of Birth/Sex: Treating [name]: 1936-05-01 (85 y.o. [name] Primary Care [name]: [name] Other Clinician: Referring Zaiden Ludlum: Treating [name]/Extender: [name] in Treatment: 2 Encounter Discharge Information Items Discharge Condition: Stable Ambulatory Status: Ambulatory Discharge Destination: Home Transportation: Private Auto Accompanied By: wife Schedule Follow-up Appointment: Yes Clinical Summary of Care: Patient Declined Electronic Signature(s) Signed: 02/18/2021 5:56:30 PM By: [name] Hurst [name], BSN Entered By: [name] on 02/13/2021 12:00:13 -------------------------------------------------------------------------------- Lower Extremity Assessment Details Patient Name: Date of Service: [name], [name] 02/13/2021 10:15 A M Medical Record Number: [id number] Patient Account Number: [id number] Date of Birth/Sex: Treating [name]: November 14, 1935 (85 y.o. [name] Primary Care Erique Kaser: [name] Other Clinician: Referring Keylen Uzelac: Treating [name]/Extender: [name] in Treatment: 2 Edema Assessment Assessed: [Left: No] [Right: No] Edema: [Left: Ye] [Right: s] Calf Left: Right: Point of Measurement: 31 cm From Medial Instep 40 cm Ankle Left: Right: Point of Measurement: 9 cm From Medial Instep 24 cm Vascular Assessment Pulses: Dorsalis Pedis Palpable: [Right:Yes] Electronic  Signature(s) Signed: 02/18/2021 5:56:30 PM By: [name] Hurst [name], BSN Entered By: [name] on 02/13/2021 11:14:15 -------------------------------------------------------------------------------- Multi Wound Chart Details Patient Name: Date of Service: [name], [name] 02/13/2021 10:15 A M Medical Record Number: [id number] Patient Account Number: [id number] Date of Birth/Sex: Treating [name]: 1935/12/25 (85 y.o. [name] Primary Care [name]: [name] Other Clinician: Referring [name]: Treating [name]/Extender: [name] in Treatment: 2 Vital Signs Height(in): Pulse(bpm): 56 Weight(lbs): Blood Pressure(mmHg): 138/57 Body Mass Index(BMI): Temperature(F): 97.6 Respiratory Rate(breaths/min): 18 Photos: [1:No Photos Right, Medial  Lower Leg] [N/A:N/A N/A] Wound Location: [1:Gradually Appeared] [N/A:N/A] Wounding Event: [1:Venous Leg Ulcer] [N/A:N/A] Primary Etiology: [1:Glaucoma, Coronary Artery Disease, N/A] Comorbid History: [1:Hypertension, Peripheral Venous Disease, Osteoarthritis 07/02/2020] [N/A:N/A] Date Acquired: [1:2] [N/A:N/A] [name] of Treatment: [1:Open] [N/A:N/A] Wound Status: [1:7x10x0.1] [N/A:N/A] Measurements L x W x D (cm) [1:54.978] [N/A:N/A] A (cm) : rea [1:5.498] [N/A:N/A] Volume (cm) : [1:18.10%] [N/A:N/A] % Reduction in Area: [1:18.10%] [N/A:N/A] % Reduction in Volume: [1:Full Thickness Without Exposed] [N/A:N/A] Classification: [1:Support Structures Medium] [N/A:N/A] Exudate Amount: [1:Serosanguineous] [N/A:N/A] Exudate Type: [1:red, brown] [N/A:N/A] Exudate Color: [1:Distinct, outline attached] [N/A:N/A] Wound Margin: [1:Medium (34-66%)] [N/A:N/A] Granulation Amount: [1:Red, Pink] [N/A:N/A] Granulation Quality: [1:Medium (34-66%)] [N/A:N/A] Necrotic Amount: [1:Fat Layer (Subcutaneous Tissue): Yes N/A] Exposed Structures: [1:Fascia: No Tendon: No Muscle: No Joint: No Bone: No Small (1-33%)]  [N/A:N/A] Epithelialization: [1:Compression Therapy] [N/A:N/A] Treatment Notes Wound #1 (Lower Leg) Wound Laterality: Right, Medial Cleanser Wound Cleanser Discharge Instruction: Cleanse the wound with wound cleanser prior to applying a clean dressing using gauze sponges, not tissue or cotton balls. Soap and Water Discharge Instruction: May shower and wash wound with dial antibacterial soap and water prior to dressing change. Peri-Wound Care Triamcinolone 15 (g) Discharge Instruction: ****Liberally****Use triamcinolone 15 (g) as directed Sween Lotion (Moisturizing lotion) Discharge Instruction: Apply moisturizing lotion as directed Topical Primary Dressing Hydrofera Blue Classic Foam, 2x2 in Discharge Instruction: Moisten with saline prior to applying to wound bed Secondary Dressing Woven Gauze Sponge, Non-Sterile 4x4 in Discharge Instruction: Apply over primary dressing as directed. ABD Pad, 5x9 Discharge Instruction: Apply over primary dressing as directed. Secured With Compression Wrap ThreePress (3 layer compression wrap) Discharge Instruction: Apply three layer compression as directed. Compression Stockings Add-Ons Electronic Signature(s) Signed: 02/13/2021 12:29:20 PM By: Kalman Shan DO Signed: 02/13/2021 6:02:39 PM By: [name] Entered By: Kalman Shan on 02/13/2021 12:22:48 -------------------------------------------------------------------------------- Multi-Disciplinary Care Plan Details Patient Name: Date of Service: [name], [name] 02/13/2021 10:15 A M Medical Record Number: [id number] Patient Account Number: [id number] Date of Birth/Sex: Treating [name]: 12-31-1935 (85 y.o. [name] Primary Care Trygve Thal: [name] Other Clinician: Referring Lizvet Chunn: Treating Ridhima Golberg/Extender: [name] in Treatment: 2 Active Inactive Wound/Skin Impairment Nursing Diagnoses: Impaired tissue integrity Knowledge deficit related to  ulceration/compromised skin integrity Goals: Patient will have a decrease in wound volume by X% from date: (specify in notes) Date Initiated: 01/30/2021 Target Resolution Date: 03/21/2021 Goal Status: Active Patient/caregiver will verbalize understanding of skin care regimen Date Initiated: 01/30/2021 Date Inactivated: 02/13/2021 Target Resolution Date: 02/13/2021 Goal Status: Met Ulcer/skin breakdown will have a volume reduction of 30% by week 4 Date Initiated: 01/30/2021 Target Resolution Date: 02/22/2021 Goal Status: Active Interventions: Assess patient/caregiver ability to obtain necessary supplies Assess patient/caregiver ability to perform ulcer/skin care regimen upon admission and as needed Assess ulceration(s) every visit Notes: Electronic Signature(s) Signed: 02/13/2021 6:02:39 PM By: [name] Entered By: [name] on 02/13/2021 10:55:39 -------------------------------------------------------------------------------- Pain Assessment Details Patient Name: Date of Service: [name], [name] 02/13/2021 10:15 A M Medical Record Number: [id number] Patient Account Number: [id number] Date of Birth/Sex: Treating [name]: Feb 03, 1936 (85 y.o. [name] Primary Care Keishla Oyer: [name] Other Clinician: Referring Jahsir Rama: Treating Draven Natter/Extender: [name] in Treatment: 2 Active Problems Location of Pain Severity and Description of Pain Patient Has Paino No Site Locations Pain Management and Medication Current Pain Management: Electronic Signature(s) Signed: 02/13/2021 4:57:49 PM By: [name] Signed: 02/13/2021 6:02:39 PM By: [name] Entered By: [name] on 02/13/2021 10:58:35 --------------------------------------------------------------------------------  Patient/Caregiver Education Details Patient Name: Date of Service: [name], [name] 5/26/2022andnbsp10:15 A M Medical Record Number: [id number] Patient Account  Number: [id number] Date of Birth/Gender: Treating [name]: 1936-07-29 (85 y.o. [name] Primary Care Physician: [name] Other Clinician: Referring Physician: Treating Physician/Extender: [name] in Treatment: 2 Education Assessment Education Provided To: Patient Education Topics Provided Wound/Skin Impairment: Handouts: Skin Care Do's and Dont's Methods: Explain/Verbal Responses: Reinforcements needed Electronic Signature(s) Signed: 02/13/2021 6:02:39 PM By: [name] Entered By: [name] on 02/13/2021 10:55:52 -------------------------------------------------------------------------------- Wound Assessment Details Patient Name: Date of Service: [name], [name] 02/13/2021 10:15 A M Medical Record Number: [id number] Patient Account Number: [id number] Date of Birth/Sex: Treating [name]: Mar 30, 1936 (85 y.o. [name] Primary Care Lainie Daubert: [name] Other Clinician: Referring Joellen Tullos: Treating Marcelline Temkin/Extender: [name] in Treatment: 2 Wound Status Wound Number: 1 Primary Venous Leg Ulcer Etiology: Wound Location: Right, Medial Lower Leg Wound Open Wounding Event: Gradually Appeared Status: Date Acquired: 07/02/2020 Comorbid Glaucoma, Coronary Artery Disease, Hypertension, Peripheral [name] Of Treatment: 2 History: Venous Disease, Osteoarthritis Clustered Wound: No Photos Wound Measurements Length: (cm) 7 Width: (cm) 10 Depth: (cm) 0.1 Area: (cm) 54.978 Volume: (cm) 5.498 % Reduction in Area: 18.1% % Reduction in Volume: 18.1% Epithelialization: Small (1-33%) Tunneling: No Undermining: No Wound Description Classification: Full Thickness Without Exposed Support Structures Wound Margin: Distinct, outline attached Exudate Amount: Medium Exudate Type: Serosanguineous Exudate Color: red, brown Foul Odor After Cleansing: No Slough/Fibrino Yes Wound Bed Granulation Amount: Medium (34-66%)  Exposed Structure Granulation Quality: Red, Pink Fascia Exposed: No Necrotic Amount: Medium (34-66%) Fat Layer (Subcutaneous Tissue) Exposed: Yes Necrotic Quality: Adherent Slough Tendon Exposed: No Muscle Exposed: No Joint Exposed: No Bone Exposed: No Treatment Notes Wound #1 (Lower Leg) Wound Laterality: Right, Medial Cleanser Wound Cleanser Discharge Instruction: Cleanse the wound with wound cleanser prior to applying a clean dressing using gauze sponges, not tissue or cotton balls. Soap and Water Discharge Instruction: May shower and wash wound with dial antibacterial soap and water prior to dressing change. Peri-Wound Care Triamcinolone 15 (g) Discharge Instruction: ****Liberally****Use triamcinolone 15 (g) as directed Sween Lotion (Moisturizing lotion) Discharge Instruction: Apply moisturizing lotion as directed Topical Primary Dressing Hydrofera Blue Classic Foam, 2x2 in Discharge Instruction: Moisten with saline prior to applying to wound bed Secondary Dressing Woven Gauze Sponge, Non-Sterile 4x4 in Discharge Instruction: Apply over primary dressing as directed. ABD Pad, 5x9 Discharge Instruction: Apply over primary dressing as directed. Secured With Compression Wrap ThreePress (3 layer compression wrap) Discharge Instruction: Apply three layer compression as directed. Compression Stockings Add-Ons Electronic Signature(s) Signed: 02/13/2021 4:57:49 PM By: [name] Signed: 02/13/2021 6:02:39 PM By: [name] Entered By: [name] on 02/13/2021 16:33:59 -------------------------------------------------------------------------------- Vitals Details Patient Name: Date of Service: [name], [name] 02/13/2021 10:15 A M Medical Record Number: [id number] Patient Account Number: [id number] Date of Birth/Sex: Treating [name]: [birth year] (85 y.o. [name] Primary Care Jaydan Meidinger: [name] Other Clinician: Referring Wyolene Weimann: Treating  Kaylena Pacifico/Extender: [name] in Treatment: 2 Vital Signs Time Taken: 10:56 Temperature (F): 97.6 Pulse (bpm): 56 Respiratory Rate (breaths/min): 18 Blood Pressure (mmHg): 138/57 Reference Range: 80 - 120 mg / dl Electronic Signature(s) Signed: 02/13/2021 4:57:49 PM By: [name] Entered By: [name] on 02/13/2021 10:58:31

## 2021-02-20 ENCOUNTER — Encounter (HOSPITAL_BASED_OUTPATIENT_CLINIC_OR_DEPARTMENT_OTHER): Payer: Medicare Other | Admitting: Internal Medicine

## 2021-02-21 ENCOUNTER — Other Ambulatory Visit: Payer: Self-pay

## 2021-02-21 ENCOUNTER — Encounter (HOSPITAL_BASED_OUTPATIENT_CLINIC_OR_DEPARTMENT_OTHER): Payer: Medicare Other | Attending: Internal Medicine | Admitting: Internal Medicine

## 2021-02-21 DIAGNOSIS — Z87891 Personal history of nicotine dependence: Secondary | ICD-10-CM | POA: Diagnosis not present

## 2021-02-21 DIAGNOSIS — I480 Paroxysmal atrial fibrillation: Secondary | ICD-10-CM | POA: Diagnosis not present

## 2021-02-21 DIAGNOSIS — I890 Lymphedema, not elsewhere classified: Secondary | ICD-10-CM | POA: Insufficient documentation

## 2021-02-21 DIAGNOSIS — I872 Venous insufficiency (chronic) (peripheral): Secondary | ICD-10-CM | POA: Diagnosis not present

## 2021-02-21 DIAGNOSIS — I2510 Atherosclerotic heart disease of native coronary artery without angina pectoris: Secondary | ICD-10-CM | POA: Diagnosis not present

## 2021-02-21 DIAGNOSIS — Z833 Family history of diabetes mellitus: Secondary | ICD-10-CM | POA: Diagnosis not present

## 2021-02-21 DIAGNOSIS — L97819 Non-pressure chronic ulcer of other part of right lower leg with unspecified severity: Secondary | ICD-10-CM | POA: Diagnosis not present

## 2021-02-24 ENCOUNTER — Encounter: Payer: Self-pay | Admitting: Internal Medicine

## 2021-02-24 ENCOUNTER — Other Ambulatory Visit: Payer: Self-pay

## 2021-02-24 ENCOUNTER — Ambulatory Visit (INDEPENDENT_AMBULATORY_CARE_PROVIDER_SITE_OTHER): Payer: Medicare Other | Admitting: Internal Medicine

## 2021-02-24 VITALS — BP 136/80 | HR 56 | Temp 97.6°F | Resp 18 | Ht 69.0 in | Wt 197.0 lb

## 2021-02-24 DIAGNOSIS — M7989 Other specified soft tissue disorders: Secondary | ICD-10-CM | POA: Diagnosis not present

## 2021-02-24 DIAGNOSIS — Z7901 Long term (current) use of anticoagulants: Secondary | ICD-10-CM | POA: Diagnosis not present

## 2021-02-24 DIAGNOSIS — L97919 Non-pressure chronic ulcer of unspecified part of right lower leg with unspecified severity: Secondary | ICD-10-CM | POA: Diagnosis not present

## 2021-02-24 DIAGNOSIS — I10 Essential (primary) hypertension: Secondary | ICD-10-CM | POA: Diagnosis not present

## 2021-02-24 DIAGNOSIS — E039 Hypothyroidism, unspecified: Secondary | ICD-10-CM | POA: Diagnosis not present

## 2021-02-24 DIAGNOSIS — R269 Unspecified abnormalities of gait and mobility: Secondary | ICD-10-CM | POA: Diagnosis not present

## 2021-02-24 LAB — CBC WITH DIFFERENTIAL/PLATELET
Basophils Absolute: 0.1 10*3/uL (ref 0.0–0.1)
Basophils Relative: 1.5 % (ref 0.0–3.0)
Eosinophils Absolute: 0.5 10*3/uL (ref 0.0–0.7)
Eosinophils Relative: 6.7 % — ABNORMAL HIGH (ref 0.0–5.0)
HCT: 44.6 % (ref 39.0–52.0)
Hemoglobin: 15.1 g/dL (ref 13.0–17.0)
Lymphocytes Relative: 19.1 % (ref 12.0–46.0)
Lymphs Abs: 1.4 10*3/uL (ref 0.7–4.0)
MCHC: 33.8 g/dL (ref 30.0–36.0)
MCV: 91.8 fl (ref 78.0–100.0)
Monocytes Absolute: 1 10*3/uL (ref 0.1–1.0)
Monocytes Relative: 13.1 % — ABNORMAL HIGH (ref 3.0–12.0)
Neutro Abs: 4.4 10*3/uL (ref 1.4–7.7)
Neutrophils Relative %: 59.6 % (ref 43.0–77.0)
Platelets: 255 10*3/uL (ref 150.0–400.0)
RBC: 4.86 Mil/uL (ref 4.22–5.81)
RDW: 14.8 % (ref 11.5–15.5)
WBC: 7.3 10*3/uL (ref 4.0–10.5)

## 2021-02-24 LAB — TSH: TSH: 11.41 u[IU]/mL — ABNORMAL HIGH (ref 0.35–4.50)

## 2021-02-24 NOTE — Patient Instructions (Addendum)
Check the  blood pressure   BP GOAL is between 110/65 and  135/85. If it is consistently higher or lower, let me know    GO TO THE LAB : Get the blood work     [name], [name] back for a checkup in 4 to 6 months   Fall Prevention in the Home, Adult Falls can cause injuries and can affect people from all age groups. There are many simple things that you can do to make your home safe and to help prevent falls. Ask for help when making these changes, if needed. What actions can I take to prevent falls? General instructions  Use good lighting in all rooms. Replace any light bulbs that burn out.  Turn on lights if it is dark. Use night-lights.  Place frequently used items in easy-to-reach places. Lower the shelves around your home if necessary.  Set up furniture so that there are clear paths around it. Avoid moving your furniture around.  Remove throw rugs and other tripping hazards from the floor.  Avoid walking on wet floors.  Fix any uneven floor surfaces.  Add color or contrast paint or tape to grab bars and handrails in your home. Place contrasting color strips on the first and last steps of stairways.  When you use a stepladder, make sure that it is completely opened and that the sides are firmly locked. Have someone hold the ladder while you are using it. Do not climb a closed stepladder.  Be aware of any and all pets. What can I do in the bathroom?  Keep the floor dry. Immediately clean up any water that spills onto the floor.  Remove soap buildup in the tub or shower on a regular basis.  Use non-skid mats or decals on the floor of the tub or shower.  Attach bath mats securely with double-sided, non-slip rug tape.  If you need to sit down while you are in the shower, use a plastic, non-slip stool.  Install grab bars by the toilet and in the tub and shower. Do not use towel bars as grab bars.      What can I do in the  bedroom?  Make sure that a bedside light is easy to reach.  Do not use oversized bedding that drapes onto the floor.  Have a firm chair that has side arms to use for getting dressed. What can I do in the kitchen?  Clean up any spills right away.  If you need to reach for something above you, use a sturdy step stool that has a grab bar.  Keep electrical cables out of the way.  Do not use floor polish or wax that makes floors slippery. If you must use wax, make sure that it is non-skid floor wax. What can I do in the stairways?  Do not leave any items on the stairs.  Make sure that you have a light switch at the top of the stairs and the bottom of the stairs. Have them installed if you do not have them.  Make sure that there are handrails on both sides of the stairs. Fix handrails that are broken or loose. Make sure that handrails are as long as the stairways.  Install non-slip stair treads on all stairs in your home.  Avoid having throw rugs at the top or bottom of stairways, or secure the rugs with carpet tape to prevent them from moving.  Choose a Loss adjuster, chartered  that does not hide the edge of steps on the stairway.  Check any carpeting to make sure that it is firmly attached to the stairs. Fix any carpet that is loose or worn. What can I do on the outside of my home?  Use bright outdoor lighting.  Regularly repair the edges of walkways and driveways and fix any cracks.  Remove high doorway thresholds.  Trim any shrubbery on the main path into your home.  Regularly check that handrails are securely fastened and in good repair. Both sides of any steps should have handrails.  Install guardrails along the edges of any raised decks or porches.  Clear walkways of debris and clutter, including tools and rocks.  Have leaves, snow, and ice cleared regularly.  Use sand or salt on walkways during winter months.  In the garage, clean up any spills right away, including grease  or oil spills. What other actions can I take?  Wear closed-toe shoes that fit well and support your feet. Wear shoes that have rubber soles or low heels.  Use mobility aids as needed, such as canes, walkers, scooters, and crutches.  Review your medicines with your health care provider. Some medicines can cause dizziness or changes in blood pressure, which increase your risk of falling. Talk with your health care provider about other ways that you can decrease your risk of falls. This may include working with a physical therapist or trainer to improve your strength, balance, and endurance. Where to find more information  Centers for Disease Control and Prevention, [employer]: WebmailGuide.co.za  [employer] on Aging: BrainJudge.co.uk Contact a health care provider if:  You are afraid of falling at home.  You feel weak, drowsy, or dizzy at home.  You fall at home. Summary  There are many simple things that you can do to make your home safe and to help prevent falls.  Ways to make your home safe include removing tripping hazards and installing grab bars in the bathroom.  Ask for help when making these changes in your home. This information is not intended to replace advice given to you by your health care provider. Make sure you discuss any questions you have with your health care provider. Document Revised: 08/20/2017 Document Reviewed: 04/22/2017 Elsevier Patient Education  2021 [employer].

## 2021-02-24 NOTE — Progress Notes (Signed)
Subjective:    Patient ID: [name], male    DOB: 28-Oct-1935, 85 y.o.   MRN: [id number]  DOS:  02/24/2021 Type of visit - description: Follow-up  Today with talk about high blood pressure, anticoagulation, thyroid disease, edema and wound care. We also discussed gait disorder and vaccinations.  In general feels well. Good med compliance No recent falls. Admits he needs more exercise.  Lower extremity wound is much better.   Review of Systems Denies any blood in the stools or in the urine. Had a single episode of nosebleed few weeks ago.  Past Medical History:  Diagnosis Date  . Arthritis   . BPH (benign prostatic hypertrophy)   . Carotid artery disease ([place])    Doppler, December 08, 2011, 00 84% R. ICA, 13-24% LICA, followup 1 year  . Coronary artery disease    DES Circumflex or CVA 2006  /  clear, February, 2011, EF 70%, no ischemia or  . Ejection fraction    EF 60%, echo, 2009, mildly calcified aortic leaflets  . History of colonic polyps   . Hyperlipidemia   . Hypertension   . Hypothyroidism   . Lumbar radiculopathy   . Multiple thyroid nodules    Avascular echogenic areas noted in the right thyroid at the time of carotid Doppler.   Marland Kitchen PONV (postoperative nausea and vomiting)    "nausea with first hip surgery"  . Skin cancer (melanoma) (H[facility])    PMH of   . Spinal stenosis, lumbar   . Tremor    Fine tremor right upper extremity  . Venous insufficiency     Past Surgical History:  Procedure Laterality Date  . COLONOSCOPY    . CORONARY ANGIOPLASTY WITH STENT PLACEMENT  2006   x 2 stents; DES to CX and dRCA '06  . KNEE SURGERY  1970's   "chipped bone"  . LUMBAR LAMINECTOMY/DECOMPRESSION MICRODISCECTOMY Left 07/13/2016   Procedure: LUMBAR LAMINECTOMY AND FORAMINOTOMY Lumbar two, three, Lumbar three-four, Lumbar four-five, LEFT Lumbar five-Sacral one DISECTOMY;  Surgeon: Newman Pies, MD;  Location: [facility];  Service: Neurosurgery;  Laterality: Left;  . TOE  SURGERY  1997  . TONSILLECTOMY    . TOTAL HIP ARTHROPLASTY Left 2007  . TOTAL HIP ARTHROPLASTY Right 2010  . TOTAL KNEE ARTHROPLASTY Right 06/25/2014   Procedure: RIGHT TOTAL KNEE ARTHROPLASTY;  Surgeon: Gearlean Alf, MD;  Location: [facility];  Service: Orthopedics;  Laterality: Right;    Allergies as of 02/24/2021      Reactions   Amlodipine Besylate Other (See Comments)   REACTION: edema      Medication List       Accurate as of February 24, 2021 11:59 PM. If you have any questions, ask your nurse or doctor.        STOP taking these medications   doxycycline 100 MG tablet Commonly known as: VIBRA-TABS Stopped by: [name], MD     TAKE these medications   aspirin 81 MG tablet Take 1 tablet (81 mg total) by mouth daily.   atorvastatin 40 MG tablet Commonly known as: LIPITOR Take 1 tablet (40 mg total) by mouth at bedtime.   benazepril 20 MG tablet Commonly known as: LOTENSIN Take 1.5 tablets (30 mg total) by mouth daily.   clobetasol cream 0.05 % Commonly known as: TEMOVATE Apply 1 application topically 2 (two) times daily as needed.   diltiazem 180 MG 24 hr capsule Commonly known as: Dilt-XR Take 1 capsule (180 mg total) by mouth  daily.   Eliquis 5 MG Tabs tablet Generic drug: apixaban TAKE 1 TABLET BY MOUTH TWICE DAILY   furosemide 40 MG tablet Commonly known as: LASIX TAKE 1 AND 1/2 TABLETS(60 MG) BY MOUTH TWICE DAILY   levothyroxine 150 MCG tablet Commonly known as: SYNTHROID TAKE 1 TABLET BY MOUTH EVERY DAY BEFORE BREAKFAST. ALONG WITH A 25MCG TABLET DAILY.   levothyroxine 25 MCG tablet Commonly known as: SYNTHROID TAKE 1 TABLET BY MOUTH DAILY BEFORE BREAKFAST ALONG WITH A 150MCG TABLET DAILY   Lumigan 0.01 % Soln Generic drug: bimatoprost Place 1 drop into both eyes 2 (two) times daily.   metoprolol tartrate 25 MG tablet Commonly known as: LOPRESSOR Take 0.5 tablets (12.5 mg total) by mouth 2 (two) times daily.   nitroGLYCERIN 0.4 MG SL  tablet Commonly known as: NITROSTAT PLACE 1 TABLET UNDER THE TONGUE EVERY 5 MINUTES AS NEEDED FOR CHEST PAIN   Timolol Maleate (Once-Daily) 0.5 % Soln Apply 1 drop to eye daily.          Objective:   Physical Exam BP 136/80 (BP Location: Left Arm, Patient Position: Sitting, Cuff Size: Normal)   Pulse (!) 56   Temp 97.6 F (36.4 C) (Oral)   Resp 18   Ht 5\' 9"  (1.753 m)   Wt 197 lb (89.4 kg)   SpO2 98%   BMI 29.09 kg/m  General:   Well developed, NAD, BMI noted. HEENT:  Normocephalic . Face symmetric, atraumatic Lungs:  CTA B Normal respiratory effort, no intercostal retractions, no accessory muscle use. Heart: Bradycardic, seems irregular Lower extremities: Both legs are wrapped Skin: Not pale. Not jaundice Neurologic:  alert & oriented X3.  Speech normal, gait appropriate for age, assisted by a cane Psych--  Cognition and judgment appear intact.  Cooperative with normal attention span and concentration.  Behavior appropriate. No anxious or depressed appearing.      Assessment     Assessment  (Transfer from  Dr. Linna Darner [phone]) Hyperglycemia, A1c 6.2 2013 HTN Hyperlipidemia Hypothyroidism  Thyroid US 2014-- no nodules, inhomogeneous  CV: --CAD: stents 2006;  low risk nuclear scan 2011, 03-2016 --A Fib dx 09/2018 at regular crads OV, on Eliquis, EF --Carotid artery disease [place] 12/2014:   Stable, 1-39% RICA stenosis. Stable, 17-61% LICA stenosis. [place] 12/2015: Next 2 years R leg edema, [place] (-)   DVT 08-2014 and 01/2019 [facility]: Dr Maureen Ralphs, back surgery Dr Arnoldo Morale 06-2016 Venous insufficiency, chronic R>L edema Tremor Glaucoma H/o skin cancer , denies Melanoma; sees derm, had a visit ~ 09/2017, Dr [place] Crane  PLAN: HTN: BP is very good, recommend to check from time to time, continue benazepril, diltiazem, Lasix, metoprolol.  Check BMP Hypothyroidism: On Synthroid 150 mcg and 25 mcg.  Check TSH Atrial fibrillation, chronic anticoagulation: Had a single episode of  nosebleed, how to treat a nosebleed d/w pt  and his wife.  Denies blood in the stools or in the urine.  Check CBC. Gait disorder: Parking permit signed, fall prevention discussed, he likes to be more active, recommend simply walking on a safe surface. Right leg ulcer: Follow-up at the wound care center, Dr. Heber [place], much improved. [place] 01/18/2021: R leg, no DVT R leg edema: Under better control at this point. Preventive care: Recommend to proceed with COVID-vaccine #3 RTC 4 to 6 months    This visit occurred during the SARS-CoV-2 public health emergency.  Safety protocols were in place, including screening questions prior to the visit, additional usage of staff PPE, and extensive cleaning  of exam room while observing appropriate contact time as indicated for disinfecting solutions.

## 2021-02-25 LAB — BASIC METABOLIC PANEL
BUN: 17 mg/dL (ref 6–23)
CO2: 24 mEq/L (ref 19–32)
Calcium: 9.4 mg/dL (ref 8.4–10.5)
Chloride: 106 mEq/L (ref 96–112)
Creatinine, Ser: 1.07 mg/dL (ref 0.40–1.50)
GFR: 64.43 mL/min (ref >60.00)
Glucose, Bld: 85 mg/dL (ref 70–99)
Potassium: 4.5 mEq/L (ref 3.5–5.1)
Sodium: 140 mEq/L (ref 135–145)

## 2021-02-25 NOTE — Assessment & Plan Note (Signed)
HTN: BP is very good, recommend to check from time to time, continue benazepril, diltiazem, Lasix, metoprolol.  Check BMP Hypothyroidism: On Synthroid 150 mcg and 25 mcg.  Check TSH Atrial fibrillation, chronic anticoagulation: Had a single episode of nosebleed, how to treat a nosebleed d/w pt  and his wife.  Denies blood in the stools or in the urine.  Check CBC. Gait disorder: Parking permit signed, fall prevention discussed, he likes to be more active, recommend simply walking on a safe surface. Right leg ulcer: Follow-up at the wound care center, Dr. Heber [place], much improved. [place] 01/18/2021: R leg, no DVT R leg edema: Under better control at this point. Preventive care: Recommend to proceed with COVID-vaccine #3 RTC 4 to 6 months

## 2021-02-26 NOTE — Progress Notes (Signed)
BUTLER, VEGH ([id number]) Visit Report for 02/21/2021 Chief Complaint Document Details Patient Name: Date of Service: [name], [name] 02/21/2021 11:15 A M Medical Record Number: [id number] Patient Account Number: [id number] Date of Birth/Sex: Treating RN: 09-12-1936 (85 y.o. [name] Primary Care Provider: [name] Other Clinician: Referring Provider: Treating Provider/Extender: [name]: 3 Information Obtained from: Patient Chief Complaint Right lower extremity wound Electronic Signature(s) Signed: 02/24/2021 9:14:40 AM By: Kalman Shan DO Entered By: Kalman Shan on 02/21/2021 12:04:43 -------------------------------------------------------------------------------- HPI Details Patient Name: Date of Service: [name], [name] 02/21/2021 11:15 A M Medical Record Number: [id number] Patient Account Number: [id number] Date of Birth/Sex: Treating RN: 25-Oct-1935 (85 y.o. [name] Primary Care Provider: [name] Other Clinician: Referring Provider: Treating Provider/Extender: [name]: 3 History of Present Illness HPI Description: [name] is an 85 year old male with a past medical history of CAD s/p DES to LCx 2006, carotid artery disease, hypertension, hyperlipidemia, paroxysmal atrial fibrillation on Eliquis and Cardizem that presents today for right lower extremity wound. He was seen by Dr. Larose Kells, his primary care provider on 4/29 for this issue and was referred to our clinic. Patient states that he had a small wound that spontaneously started in October 2021 and has not healed. He states this has progressively gotten larger. He has been using acne cream prescribed by the dermatologist for this issue. He reports drainage to the wound but no purulent drainage. He reports mild soreness to the wound. He has swelling in his right leg greater than left and reports this is a chronic issue for  the past 1 to 2 years. He denies resting leg pain or pain with ambulation. Of note He was prescribed doxycycline at the end of last month and has finished this course for possible skin infection to the wound site. 02/06/2021; I am seeing this patient who was admitted to the clinic last week by Dr. Heber [place]. He has predominantly I think a venous insufficiency wound on the right medial lower leg he has been using Santyl Hydrofera Blue under 3 layer compression. Arterial studies were ordered last week but do not seem to been put through. He is not a diabetic. He did have venous reflux studies done in August 2020. He did have abnormal reflux time was noted in the great saphenous vein in the distal thigh and the great saphenous vein at the knee. There was no evidence of DVT or SVT at the time he was not felt to have large enough saphenous veins on either side for intervention. Compression stockings at least knee-high were recommended. Follow-up was on a as needed basis with Dr. Donzetta Matters The patient does not describe claudication but his pulses in his feet are not vibrant this could be because of the swelling 5/26; patient presents for 1 week follow-up. He has been using Hydrofera Blue under 3 layer compression. He had arterial studies done. He has no issues or complaints today. He denies signs of infection. He has his juxta light compressions with him today. 6/3; patient presents for 1 week follow-up. He has been using Hydrofera Blue under 3 layer compression. He denies signs and symptoms of infection. He did have bright green drainage which he states he has not seen before when the dressing was taken off today. He is using his juxta light compression to the left leg. Electronic Signature(s) Signed: 02/24/2021 9:14:40 AM By: Kalman Shan DO Entered By: Kalman Shan  on 02/21/2021 12:05:38 -------------------------------------------------------------------------------- Physical Exam Details Patient Name:  Date of Service: [name], [name] 02/21/2021 11:15 A M Medical Record Number: [id number] Patient Account Number: [id number] Date of Birth/Sex: Treating RN: 15-Jul-1936 (85 y.o. [name] Primary Care Provider: [name] Other Clinician: Referring Provider: Treating Provider/Extender: [name]: 3 Constitutional respirations regular, non-labored and within target range for patient.. Cardiovascular 2+ dorsalis pedis/posterior tibialis pulses. Psychiatric pleasant and cooperative. Notes Right lower extremity: There is a wound to the medial distal area With granulation tissue and epithelialized tissue throughout. No obvious signs of infection. Electronic Signature(s) Signed: 02/24/2021 9:14:40 AM By: Kalman Shan DO Entered By: Kalman Shan on 02/21/2021 12:06:22 -------------------------------------------------------------------------------- Physician Orders Details Patient Name: Date of Service: [name], [name] 02/21/2021 11:15 A M Medical Record Number: [id number] Patient Account Number: [id number] Date of Birth/Sex: Treating RN: 1935/10/15 (85 y.o. [name] Primary Care Provider: [name] Other Clinician: Referring Provider: Treating Provider/Extender: [name]: 3 Verbal / Phone Orders: No Diagnosis Coding ICD-10 Coding Code Description L97.819 Non-pressure chronic ulcer of other part of right lower leg with unspecified severity I87.2 Venous insufficiency (chronic) (peripheral) I25.10 Atherosclerotic heart disease of native coronary artery without angina pectoris I48.0 Paroxysmal atrial fibrillation Z79.01 Long term (current) use of anticoagulants Follow-up Appointments ppointment in 1 week. - with Dr. Heber [place] Return A Bathing/ Shower/ Hygiene May shower with protection but do not get wound dressing(s) wet. - May use cast protectors to cover your wrap...you can get those  at [facility] or [facility]. Edema Control - Lymphedema / SCD / Other Elevate legs to the level of the heart or above for 30 minutes daily and/or when sitting, a frequency of: - 3-4 times a throughout the day. Avoid standing for long periods of time. Exercise regularly Moisturize legs daily. - lotion left leg every night before bed. Compression stocking or Garment 30-40 mm/Hg pressure to: - keep one juxtalite compression garment in car bring in weekly for right leg. ****Wound center to apply and demonstrate how to apply juxtatlite to left leg. Patient to apply in the morning and remove at night to left leg. Wound Treatment Wound #1 - Lower Leg Wound Laterality: Right, Medial Cleanser: Soap and Water 1 x Per Week/15 Days Discharge Instructions: May shower and wash wound with dial antibacterial soap and water prior to dressing change. Cleanser: Wound Cleanser 1 x Per Week/15 Days Discharge Instructions: Cleanse the wound with wound cleanser prior to applying a clean dressing using gauze sponges, not tissue or cotton balls. Peri-Wound Care: Triamcinolone 15 (g) 1 x Per Week/15 Days Discharge Instructions: ****Liberally****Use triamcinolone 15 (g) as directed Peri-Wound Care: Zinc Oxide Ointment 30g tube 1 x Per Week/15 Days Discharge Instructions: Apply Zinc Oxide to periwound with each dressing change Peri-Wound Care: Sween Lotion (Moisturizing lotion) 1 x Per Week/15 Days Discharge Instructions: Apply moisturizing lotion as directed Topical: Gentamicin 1 x Per Week/15 Days Discharge Instructions: Apply Gentamicin under Hydrofera Prim Dressing: Hydrofera Blue Classic Foam, 2x2 in 1 x Per Week/15 Days ary Discharge Instructions: Moisten with saline prior to applying to wound bed Secondary Dressing: Woven Gauze Sponge, Non-Sterile 4x4 in 1 x Per Week/15 Days Discharge Instructions: Apply over primary dressing as directed. Secondary Dressing: ABD Pad, 5x9 1 x Per Week/15 Days Discharge Instructions:  Apply over primary dressing as directed. Compression Wrap: ThreePress (3 layer compression wrap) 1 x Per Week/15 Days Discharge Instructions: Apply three layer compression as directed.  Electronic Signature(s) Signed: 02/24/2021 9:14:40 AM By: Kalman Shan DO Signed: 02/26/2021 5:57:05 PM By: Levan Hurst RN, BSN Entered By: Levan Hurst on 02/21/2021 12:07:06 -------------------------------------------------------------------------------- Problem List Details Patient Name: Date of Service: [name], [name] 02/21/2021 11:15 A M Medical Record Number: [id number] Patient Account Number: [id number] Date of Birth/Sex: Treating RN: 27-Nov-1935 (85 y.o. [name] Primary Care Provider: [name] Other Clinician: Referring Provider: Treating Provider/Extender: [name]: 3 Active Problems ICD-10 Encounter Code Description Active Date MDM Diagnosis L97.819 Non-pressure chronic ulcer of other part of right lower leg with unspecified 01/30/2021 No Yes severity I87.2 Venous insufficiency (chronic) (peripheral) 01/30/2021 No Yes I25.10 Atherosclerotic heart disease of native coronary artery without angina pectoris 01/30/2021 No Yes I48.0 Paroxysmal atrial fibrillation 01/30/2021 No Yes Z79.01 Long term (current) use of anticoagulants 01/30/2021 No Yes Inactive Problems Resolved Problems Electronic Signature(s) Signed: 02/24/2021 9:14:40 AM By: Kalman Shan DO Entered By: Kalman Shan on 02/21/2021 12:04:31 -------------------------------------------------------------------------------- Progress Note Details Patient Name: Date of Service: [name], [name] 02/21/2021 11:15 A M Medical Record Number: [id number] Patient Account Number: [id number] Date of Birth/Sex: Treating RN: [birth year] (85 y.o. [name] Primary Care Provider: [name] Other Clinician: Referring Provider: Treating Provider/Extender: [name]: 3 Subjective Chief Complaint Information obtained from Patient Right lower extremity wound History of Present Illness (HPI) [name] is an 85 year old male with a past medical history of CAD s/p DES to LCx 2006, carotid artery disease, hypertension, hyperlipidemia, paroxysmal atrial fibrillation on Eliquis and Cardizem that presents today for right lower extremity wound. He was seen by Dr. Larose Kells, his primary care provider on 4/29 for this issue and was referred to our clinic. Patient states that he had a small wound that spontaneously started in October 2021 and has not healed. He states this has progressively gotten larger. He has been using acne cream prescribed by the dermatologist for this issue. He reports drainage to the wound but no purulent drainage. He reports mild soreness to the wound. He has swelling in his right leg greater than left and reports this is a chronic issue for the past 1 to 2 years. He denies resting leg pain or pain with ambulation. Of note He was prescribed doxycycline at the end of last month and has finished this course for possible skin infection to the wound site. 02/06/2021; I am seeing this patient who was admitted to the clinic last week by Dr. Heber [place]. He has predominantly I think a venous insufficiency wound on the right medial lower leg he has been using Santyl Hydrofera Blue under 3 layer compression. Arterial studies were ordered last week but do not seem to been put through. He is not a diabetic. He did have venous reflux studies done in August 2020. He did have abnormal reflux time was noted in the great saphenous vein in the distal thigh and the great saphenous vein at the knee. There was no evidence of DVT or SVT at the time he was not felt to have large enough saphenous veins on either side for intervention. Compression stockings at least knee-high were recommended. Follow-up was on a as needed basis with Dr. Donzetta Matters The patient  does not describe claudication but his pulses in his feet are not vibrant this could be because of the swelling 5/26; patient presents for 1 week follow-up. He has been using Hydrofera Blue under 3 layer compression. He had arterial studies done. He  has no issues or complaints today. He denies signs of infection. He has his juxta light compressions with him today. 6/3; patient presents for 1 week follow-up. He has been using Hydrofera Blue under 3 layer compression. He denies signs and symptoms of infection. He did have bright green drainage which he states he has not seen before when the dressing was taken off today. He is using his juxta light compression to the left leg. Patient History Information obtained from Patient. Family History Cancer - Father, Diabetes - Maternal Grandparents, Heart Disease - Maternal Grandparents, Hypertension - Maternal Grandparents, No family history of Hereditary Spherocytosis, Kidney Disease, Lung Disease, Seizures, Stroke, Thyroid Problems, Tuberculosis. Social History Former smoker - Quit over 30 years ago, Marital Status - Married, Alcohol Use - Rarely, Drug Use - No History, Caffeine Use - Daily. Medical History Eyes Patient has history of Glaucoma Cardiovascular Patient has history of Coronary Artery Disease, Hypertension, Peripheral Venous Disease Integumentary (Skin) Denies history of History of Burn Musculoskeletal Patient has history of Osteoarthritis Medical A Surgical History Notes nd Endocrine Hypothyroidism Genitourinary BPH Musculoskeletal Bilat Hip Replacements, Right Knee Replacement Oncologic Skin Cancer multiple sites Objective Constitutional respirations regular, non-labored and within target range for patient.. Vitals Time Taken: 11:50 AM, Temperature: 98.2 F, Pulse: 74 bpm, Respiratory Rate: 16 breaths/min, Blood Pressure: 117/73 mmHg. Cardiovascular 2+ dorsalis pedis/posterior tibialis pulses. Psychiatric pleasant and  cooperative. General Notes: Right lower extremity: There is a wound to the medial distal area With granulation tissue and epithelialized tissue throughout. No obvious signs of infection. Integumentary (Hair, Skin) Wound #1 status is Open. Original cause of wound was Gradually Appeared. The date acquired was: 07/02/2020. The wound has been in treatment 3 weeks. The wound is located on the Right,Medial Lower Leg. The wound measures 6cm length x 8cm width x 0.1cm depth; 37.699cm^2 area and 3.77cm^3 volume. There is Fat Layer (Subcutaneous Tissue) exposed. There is no tunneling or undermining noted. There is a medium amount of purulent drainage noted. The wound margin is distinct with the outline attached to the wound base. There is large (67-100%) red, pink granulation within the wound bed. There is a small (1- 33%) amount of necrotic tissue within the wound bed including Adherent Slough. Assessment Active Problems ICD-10 Non-pressure chronic ulcer of other part of right lower leg with unspecified severity Venous insufficiency (chronic) (peripheral) Atherosclerotic heart disease of native coronary artery without angina pectoris Paroxysmal atrial fibrillation Long term (current) use of anticoagulants Patients wound has improved in appearance and size since last clinic visit. There are no obvious signs of infection. He did however have the classic drainage of Pseudomonas so I will place gentamicin ointment on the wound prior to the wrap With Hydrofera Blue.Overall he is doing well. I will see him at follow-up in 1 week. Procedures Wound #1 Pre-procedure diagnosis of Wound #1 is a Venous Leg Ulcer located on the Right,Medial Lower Leg . There was a Three Layer Compression Therapy Procedure by Levan Hurst, RN. Post procedure Diagnosis Wound #1: Same as Pre-Procedure Plan 1. Hydrofera Blue with gentamicin under 3 layer compression 2. Follow-up in 1 week Electronic Signature(s) Signed:  02/24/2021 9:14:40 AM By: Kalman Shan DO Entered By: Kalman Shan on 02/24/2021 07:59:03 -------------------------------------------------------------------------------- HxROS Details Patient Name: Date of Service: [name], [name] 02/21/2021 11:15 A M Medical Record Number: [id number] Patient Account Number: [id number] Date of Birth/Sex: Treating RN: [birth year] (85 y.o. [name] Primary Care Provider: [name] Other Clinician: Referring Provider: Treating Provider/Extender: Heber [place],  [place], [place] Weeks in Treatment: 3 Information Obtained From Patient Eyes Medical History: Positive for: Glaucoma Cardiovascular Medical History: Positive for: Coronary Artery Disease; Hypertension; Peripheral Venous Disease Endocrine Medical History: Past Medical History Notes: Hypothyroidism Genitourinary Medical History: Past Medical History Notes: BPH Integumentary (Skin) Medical History: Negative for: History of Burn Musculoskeletal Medical History: Positive for: Osteoarthritis Past Medical History Notes: Bilat Hip Replacements, Right Knee Replacement Oncologic Medical History: Past Medical History Notes: Skin Cancer multiple sites HBO Extended History Items Eyes: Glaucoma Immunizations Pneumococcal Vaccine: Received Pneumococcal Vaccination: No Implantable Devices None Family and Social History Cancer: Yes - Father; Diabetes: Yes - Maternal Grandparents; Heart Disease: Yes - Maternal Grandparents; Hereditary Spherocytosis: No; Hypertension: Yes - Maternal Grandparents; Kidney Disease: No; Lung Disease: No; Seizures: No; Stroke: No; Thyroid Problems: No; Tuberculosis: No; Former smoker - Quit over 30 years ago; Marital Status - Married; Alcohol Use: Rarely; Drug Use: No History; Caffeine Use: Daily; Financial Concerns: No; Food, Clothing or Shelter Needs: No; Support System Lacking: No; Transportation Concerns: No Electronic Signature(s) Signed: 02/21/2021  6:03:14 PM By: Lorrin Jackson Signed: 02/24/2021 9:14:40 AM By: Kalman Shan DO Entered By: Kalman Shan on 02/21/2021 12:05:44 -------------------------------------------------------------------------------- SuperBill Details Patient Name: Date of Service: [name], [name] 02/21/2021 Medical Record Number: [id number] Patient Account Number: [id number] Date of Birth/Sex: Treating RN: Apr 05, 1936 (85 y.o. [name] Primary Care Provider: [name] Other Clinician: Referring Provider: Treating Provider/Extender: [name]: 3 Diagnosis Coding ICD-10 Codes Code Description [phone] Non-pressure chronic ulcer of other part of right lower leg with unspecified severity I87.2 Venous insufficiency (chronic) (peripheral) I25.10 Atherosclerotic heart disease of native coronary artery without angina pectoris I48.0 Paroxysmal atrial fibrillation Z79.01 Long term (current) use of anticoagulants Facility Procedures CPT4 Code: [id number] Description: (Facility Use Only) [phone] - [place] LWR RT LEG Modifier: Quantity: 1 Physician Procedures : CPT4 Code Description Modifier [id number] - WC PHYS LEVEL 3 - EST PT ICD-10 Diagnosis Description L97.819 Non-pressure chronic ulcer of other part of right lower leg with unspecified severity I87.2 Venous insufficiency (chronic) (peripheral) Quantity: 1 Electronic Signature(s) Signed: 02/24/2021 9:14:40 AM By: Kalman Shan DO Entered By: Kalman Shan on 02/24/2021 07:59:31

## 2021-02-26 NOTE — Progress Notes (Signed)
GABRYEL, FILES ([id number]) Visit Report for 02/21/2021 Arrival Information Details Patient Name: Date of Service: [name], [place] RD E. 02/21/2021 11:15 A M Medical Record Number: [id number] Patient Account Number: [id number] Date of Birth/Sex: Treating RN: 10/14/1935 (85 y.o. [name] Primary Care : [name] Other Clinician: Referring : Treating /Extender: [name]: 3 Visit Information History Since Last Visit Added or deleted any medications: No Patient Arrived: Ambulatory Any new allergies or adverse reactions: No Arrival Time: 11:50 Had a fall or experienced change in No Accompanied By: wife activities of daily living that may affect Transfer Assistance: None risk of falls: Patient Identification Verified: Yes Signs or symptoms of abuse/neglect since last visito No Secondary Verification Process Completed: Yes Hospitalized since last visit: No Patient Requires Transmission-Based Precautions: No Implantable device outside of the clinic excluding No Patient Has Alerts: Yes cellular tissue based products placed in the center Patient Alerts: Patient on Blood Thinner since last visit: Has Dressing in Place as Prescribed: Yes Has Compression in Place as Prescribed: Yes Pain Present Now: No Electronic Signature(s) Signed: 02/26/2021 5:57:05 PM By: Levan Hurst RN, BSN Entered By: Levan Hurst on 02/21/2021 11:57:04 -------------------------------------------------------------------------------- Compression Therapy Details Patient Name: Date of Service: [name], [name] 02/21/2021 11:15 A M Medical Record Number: [id number] Patient Account Number: [id number] Date of Birth/Sex: Treating RN: [birth year] (85 y.o. [name] Primary Care : [name] Other Clinician: Referring : Treating /Extender: [name]: 3 Compression Therapy Performed for  Wound Assessment: Wound #1 Right,Medial Lower Leg Performed By: Clinician Levan Hurst, RN Compression Type: Three Layer Post Procedure Diagnosis Same as Pre-procedure Electronic Signature(s) Signed: 02/26/2021 5:57:05 PM By: Levan Hurst RN, BSN Entered By: Levan Hurst on 02/21/2021 12:19:58 -------------------------------------------------------------------------------- Encounter Discharge Information Details Patient Name: Date of Service: [name], [name] 02/21/2021 11:15 A M Medical Record Number: [id number] Patient Account Number: [id number] Date of Birth/Sex: Treating RN: 08-03-1936 (85 y.o. [name] Primary Care : [name] Other Clinician: Referring : Treating /Extender: [name]: 3 Encounter Discharge Information Items Discharge Condition: Stable Ambulatory Status: Ambulatory Discharge Destination: Home Transportation: Private Auto Accompanied By: wife Schedule Follow-up Appointment: Yes Clinical Summary of Care: Patient Declined Electronic Signature(s) Signed: 02/26/2021 5:57:05 PM By: Levan Hurst RN, BSN Entered By: Levan Hurst on 02/21/2021 12:21:26 -------------------------------------------------------------------------------- Lower Extremity Assessment Details Patient Name: Date of Service: [name], [name] 02/21/2021 11:15 A M Medical Record Number: [id number] Patient Account Number: [id number] Date of Birth/Sex: Treating RN: [birth year] (84 y.o. [name] Primary Care : [name] Other Clinician: Referring : Treating /Extender: [name]: 3 Edema Assessment Assessed: [Left: No] [Right: No] Edema: [Left: Ye] [Right: s] Calf Left: Right: Point of Measurement: 31 cm From Medial Instep 40 cm Ankle Left: Right: Point of Measurement: 9 cm From Medial Instep 24 cm Vascular Assessment Pulses: Dorsalis  Pedis Palpable: [Right:Yes] Electronic Signature(s) Signed: 02/26/2021 5:57:05 PM By: Levan Hurst RN, BSN Entered By: Levan Hurst on 02/21/2021 11:57:36 -------------------------------------------------------------------------------- Multi Wound Chart Details Patient Name: Date of Service: [name], [name] 02/21/2021 11:15 A M Medical Record Number: [id number] Patient Account Number: [id number] Date of Birth/Sex: Treating RN: Mar 22, 1936 (85 y.o. [name] Primary Care : [name] Other Clinician: Referring : Treating /Extender: [name]: 3 Vital Signs Height(in): Pulse(bpm): 74 Weight(lbs): Blood Pressure(mmHg): 117/73 Body Mass  Index(BMI): Temperature(F): 98.2 Respiratory Rate(breaths/min): 16 Wound Assessments Treatment Notes Electronic Signature(s) Signed: 02/21/2021 6:03:14 PM By: Lorrin Jackson Signed: 02/24/2021 9:14:40 AM By: Kalman Shan DO Entered By: Kalman Shan on 02/21/2021 12:04:35 -------------------------------------------------------------------------------- Multi-Disciplinary Care Plan Details Patient Name: Date of Service: [name], [name] 02/21/2021 11:15 A M Medical Record Number: [id number] Patient Account Number: [id number] Date of Birth/Sex: Treating RN: 1936/04/01 (85 y.o. [name] Primary Care : [name] Other Clinician: Referring : Treating /Extender: [name]: 3 Active Inactive Wound/Skin Impairment Nursing Diagnoses: Impaired tissue integrity Knowledge deficit related to ulceration/compromised skin integrity Goals: Patient will have a decrease in wound volume by X% from date: (specify in notes) Date Initiated: 01/30/2021 Target Resolution Date: 03/21/2021 Goal Status: Active Patient/caregiver will verbalize understanding of skin care regimen Date Initiated: 01/30/2021 Date Inactivated:  02/13/2021 Target Resolution Date: 02/13/2021 Goal Status: Met Ulcer/skin breakdown will have a volume reduction of 30% by week 4 Date Initiated: 01/30/2021 Target Resolution Date: 02/28/2021 Goal Status: Active Interventions: Assess patient/caregiver ability to obtain necessary supplies Assess patient/caregiver ability to perform ulcer/skin care regimen upon admission and as needed Assess ulceration(s) every visit Notes: Electronic Signature(s) Signed: 02/26/2021 5:57:05 PM By: Levan Hurst RN, BSN Entered By: Levan Hurst on 02/21/2021 12:20:15 -------------------------------------------------------------------------------- Pain Assessment Details Patient Name: Date of Service: [name], [name] 02/21/2021 11:15 A M Medical Record Number: [id number] Patient Account Number: [id number] Date of Birth/Sex: Treating RN: 1936-06-30 (85 y.o. [name] Primary Care : [name] Other Clinician: Referring : Treating /Extender: [name]: 3 Active Problems Location of Pain Severity and Description of Pain Patient Has Paino No Site Locations Pain Management and Medication Current Pain Management: Electronic Signature(s) Signed: 02/26/2021 5:57:05 PM By: Levan Hurst RN, BSN Entered By: Levan Hurst on 02/21/2021 11:57:28 -------------------------------------------------------------------------------- Patient/Caregiver Education Details Patient Name: Date of Service: [name], [name] 6/3/2022andnbsp11:15 A M Medical Record Number: [id number] Patient Account Number: [id number] Date of Birth/Gender: Treating RN: 08/04/1936 (85 y.o. [name] Primary Care Physician: [name] Other Clinician: Referring Physician: Treating Physician/Extender: [name] in Treatment: 3 Education Assessment Education Provided To: Patient Education Topics Provided Wound/Skin Impairment: Methods:  Explain/Verbal Responses: State content correctly Electronic Signature(s) Signed: 02/26/2021 5:57:05 PM By: Levan Hurst RN, BSN Entered By: Levan Hurst on 02/21/2021 12:20:24 -------------------------------------------------------------------------------- Wound Assessment Details Patient Name: Date of Service: [name], [name] 02/21/2021 11:15 A M Medical Record Number: [id number] Patient Account Number: [id number] Date of Birth/Sex: Treating RN: [birth year] (85 y.o. [name] Primary Care : [name] Other Clinician: Referring : Treating /Extender: [name]: 3 Wound Status Wound Number: 1 Primary Venous Leg Ulcer Etiology: Wound Location: Right, Medial Lower Leg Wound Open Wounding Event: Gradually Appeared Status: Date Acquired: 07/02/2020 Comorbid Glaucoma, Coronary Artery Disease, Hypertension, Peripheral Weeks Of Treatment: 3 History: Venous Disease, Osteoarthritis Clustered Wound: No Photos Wound Measurements Length: (cm) 6 Width: (cm) 8 Depth: (cm) 0.1 Area: (cm) 37.699 Volume: (cm) 3.77 % Reduction in Area: 43.9% % Reduction in Volume: 43.9% Epithelialization: Small (1-33%) Tunneling: No Undermining: No Wound Description Classification: Full Thickness Without Exposed Support Structures Wound Margin: Distinct, outline attached Exudate Amount: Medium Exudate Type: Purulent Exudate Color: yellow, brown, green Foul Odor After Cleansing: No Slough/Fibrino Yes Wound Bed Granulation Amount: Large (67-100%) Exposed Structure Granulation Quality: Red, Pink Fascia Exposed: No Necrotic Amount: Small (1-33%) Fat Layer (Subcutaneous Tissue) Exposed:  Yes Necrotic Quality: Adherent Slough Tendon Exposed: No Muscle Exposed: No Joint Exposed: No Bone Exposed: No Treatment Notes Wound #1 (Lower Leg) Wound Laterality: Right, Medial Cleanser Wound Cleanser Discharge Instruction: Cleanse the  wound with wound cleanser prior to applying a clean dressing using gauze sponges, not tissue or cotton balls. Soap and Water Discharge Instruction: May shower and wash wound with dial antibacterial soap and water prior to dressing change. Peri-Wound Care Triamcinolone 15 (g) Discharge Instruction: ****Liberally****Use triamcinolone 15 (g) as directed Sween Lotion (Moisturizing lotion) Discharge Instruction: Apply moisturizing lotion as directed Zinc Oxide Ointment 30g tube Discharge Instruction: Apply Zinc Oxide to periwound with each dressing change Topical Gentamicin Discharge Instruction: Apply Gentamicin under Hydrofera Primary Dressing Hydrofera Blue Classic Foam, 2x2 in Discharge Instruction: Moisten with saline prior to applying to wound bed Secondary Dressing Woven Gauze Sponge, Non-Sterile 4x4 in Discharge Instruction: Apply over primary dressing as directed. ABD Pad, 5x9 Discharge Instruction: Apply over primary dressing as directed. Secured With Compression Wrap ThreePress (3 layer compression wrap) Discharge Instruction: Apply three layer compression as directed. Compression Stockings Add-Ons Electronic Signature(s) Signed: 02/21/2021 5:48:41 PM By: Sandre Kitty Signed: 02/26/2021 5:57:05 PM By: Levan Hurst RN, BSN Entered By: Sandre Kitty on 02/21/2021 17:14:41 -------------------------------------------------------------------------------- Vitals Details Patient Name: Date of Service: [name], [name] 02/21/2021 11:15 A M Medical Record Number: [id number] Patient Account Number: [id number] Date of Birth/Sex: Treating RN: [birth year] (85 y.o. [name] Primary Care : [name] Other Clinician: Referring : Treating /Extender: [name]: 3 Vital Signs Time Taken: 11:50 Temperature (F): 98.2 Pulse (bpm): 74 Respiratory Rate (breaths/min): 16 Blood Pressure (mmHg): 117/73 Reference  Range: 80 - 120 mg / dl Electronic Signature(s) Signed: 02/26/2021 5:57:05 PM By: Levan Hurst RN, BSN Entered By: Levan Hurst on 02/21/2021 11:57:23

## 2021-02-27 NOTE — Addendum Note (Signed)
Addended byDamita Dunnings D on: 02/27/2021 11:57 AM   Modules accepted: Orders

## 2021-02-28 ENCOUNTER — Other Ambulatory Visit: Payer: Self-pay

## 2021-02-28 ENCOUNTER — Encounter (HOSPITAL_BASED_OUTPATIENT_CLINIC_OR_DEPARTMENT_OTHER): Payer: Medicare Other | Admitting: Internal Medicine

## 2021-02-28 DIAGNOSIS — I480 Paroxysmal atrial fibrillation: Secondary | ICD-10-CM | POA: Diagnosis not present

## 2021-02-28 DIAGNOSIS — Z87891 Personal history of nicotine dependence: Secondary | ICD-10-CM | POA: Diagnosis not present

## 2021-02-28 DIAGNOSIS — I890 Lymphedema, not elsewhere classified: Secondary | ICD-10-CM | POA: Diagnosis not present

## 2021-02-28 DIAGNOSIS — I2510 Atherosclerotic heart disease of native coronary artery without angina pectoris: Secondary | ICD-10-CM | POA: Diagnosis not present

## 2021-02-28 DIAGNOSIS — I872 Venous insufficiency (chronic) (peripheral): Secondary | ICD-10-CM | POA: Diagnosis not present

## 2021-02-28 DIAGNOSIS — Z833 Family history of diabetes mellitus: Secondary | ICD-10-CM | POA: Diagnosis not present

## 2021-02-28 DIAGNOSIS — L97819 Non-pressure chronic ulcer of other part of right lower leg with unspecified severity: Secondary | ICD-10-CM

## 2021-02-28 NOTE — Progress Notes (Signed)
[name] ([id number]) Visit Report for 02/28/2021 Chief Complaint Document Details Patient Name: Date of Service: [name], [name] 02/28/2021 11:15 A M Medical Record Number: [id number] Patient Account Number: [id number] Date of Birth/Sex: Treating RN: [birth year] (85 y.o. [name] Primary Care Provider: Kathlene [name] Other Clinician: Referring Provider: Treating Provider/Extender: [name] in Treatment: 4 Information Obtained from: Patient Chief Complaint Right lower extremity wound Electronic Signature(s) Signed: 02/28/2021 1:27:06 PM By: [name] Shan DO Entered By: [name] on 02/28/2021 13:18:31 -------------------------------------------------------------------------------- HPI Details Patient Name: Date of Service: [name], [name] 02/28/2021 11:15 A M Medical Record Number: [id number] Patient Account Number: [id number] Date of Birth/Sex: Treating RN: 1935-11-13 (85 y.o. [name] Primary Care Provider: Kathlene [name] Other Clinician: Referring Provider: Treating Provider/Extender: [name] in Treatment: 4 History of Present Illness HPI Description: [name] is an 85 year old male with a past medical history of CAD s/p DES to LCx 2006, carotid artery disease, hypertension, hyperlipidemia, paroxysmal atrial fibrillation on Eliquis and Cardizem that presents today for right lower extremity wound. He was seen by [name], his primary care provider on 4/29 for this issue and was referred to our clinic. Patient states that he had a small wound that spontaneously started in October 2021 and has not healed. He states this has progressively gotten larger. He has been using acne cream prescribed by the dermatologist for this issue. He reports drainage to the wound but no purulent drainage. He reports mild soreness to the wound. He has swelling in his right leg greater than left and reports this is a chronic issue  for the past 1 to 2 years. He denies resting leg pain or pain with ambulation. Of note He was prescribed doxycycline at the end of last month and has finished this course for possible skin infection to the wound site. 02/06/2021; I am seeing this patient who was admitted to the clinic last week by [name] He has predominantly I think a venous insufficiency wound on the right medial lower leg he has been using Santyl Hydrofera Blue under 3 layer compression. Arterial studies were ordered last week but do not seem to been put through. He is not a diabetic. He did have venous reflux studies done in August 2020. He did have abnormal reflux time was noted in the great saphenous vein in the distal thigh and the great saphenous vein at the knee. There was no evidence of DVT or SVT at the time he was not felt to have large enough saphenous veins on either side for intervention. Compression stockings at least knee-high were recommended. Follow-up was on a as needed basis with [name] The patient does not describe claudication but his pulses in his feet are not vibrant this could be because of the swelling 5/26; patient presents for 1 week follow-up. He has been using Hydrofera Blue under 3 layer compression. He had arterial studies done. He has no issues or complaints today. He denies signs of infection. He has his juxta light compressions with him today. 6/3; patient presents for 1 week follow-up. He has been using Hydrofera Blue under 3 layer compression. He denies signs and symptoms of infection. He did have bright green drainage which he states he has not seen before when the dressing was taken off today. He is using his juxta light compression to the left leg. 6/10; patient presents for 1 week follow-up. He has been tolerating Hydrofera Blue with  3 layer compression. He again reports bright green drainage. However he denies any signs of infection. He has been using juxta light compression to the left  leg. Electronic Signature(s) Signed: 02/28/2021 1:27:06 PM By: [name] Shan DO Entered By: [name] on 02/28/2021 13:19:17 -------------------------------------------------------------------------------- Physical Exam Details Patient Name: Date of Service: [name], [name] 02/28/2021 11:15 A M Medical Record Number: [id number] Patient Account Number: [id number] Date of Birth/Sex: Treating RN: Dec 02, 1935 (85 y.o. [name] Primary Care Provider: Kathlene [name] Other Clinician: Referring Provider: Treating Provider/Extender: [name] in Treatment: 4 Constitutional respirations regular, non-labored and within target range for patient.. Notes Right lower extremity: Open wound with granulation tissue and epithelialization tissue throughout. No obvious signs of infection. Improvement from the last clinic visit. Electronic Signature(s) Signed: 02/28/2021 1:27:06 PM By: [name] Shan DO Entered By: [name] on 02/28/2021 13:23:38 -------------------------------------------------------------------------------- Physician Orders Details Patient Name: Date of Service: [name], [name] 02/28/2021 11:15 A M Medical Record Number: [id number] Patient Account Number: [id number] Date of Birth/Sex: Treating RN: 1935/11/27 (85 y.o. [name] Primary Care Provider: Kathlene [name] Other Clinician: Referring Provider: Treating Provider/Extender: [name] in Treatment: 4 Verbal / Phone Orders: No Diagnosis Coding ICD-10 Coding Code Description L97.819 Non-pressure chronic ulcer of other part of right lower leg with unspecified severity I87.2 Venous insufficiency (chronic) (peripheral) I25.10 Atherosclerotic heart disease of native coronary artery without angina pectoris I48.0 Paroxysmal atrial fibrillation Z79.01 Long term (current) use of anticoagulants Follow-up Appointments ppointment in 2 [name] - with Dr.  Heber [place] Return A Nurse Visit: - Wrap Change Bathing/ Shower/ Hygiene May shower with protection but do not get wound dressing(s) wet. - May use cast protectors to cover your wrap...you can get those at [facility] or [facility]. Edema Control - Lymphedema / SCD / Other Elevate legs to the level of the heart or above for 30 minutes daily and/or when sitting, a frequency of: - 3-4 times a throughout the day. Avoid standing for long periods of time. Exercise regularly Moisturize legs daily. - lotion left leg every night before bed. Compression stocking or Garment 30-40 mm/Hg pressure to: - keep one juxtalite compression garment in car bring in weekly for right leg. ****Wound center to apply and demonstrate how to apply juxtatlite to left leg. Patient to apply in the morning and remove at night to left leg. Wound Treatment Wound #1 - Lower Leg Wound Laterality: Right, Medial Cleanser: Soap and Water 1 x Per Week/15 Days Discharge Instructions: May shower and wash wound with dial antibacterial soap and water prior to dressing change. Cleanser: Wound Cleanser 1 x Per Week/15 Days Discharge Instructions: Cleanse the wound with wound cleanser prior to applying a clean dressing using gauze sponges, not tissue or cotton balls. Peri-Wound Care: Triamcinolone 15 (g) 1 x Per Week/15 Days Discharge Instructions: ****Liberally****Use triamcinolone 15 (g) as directed Peri-Wound Care: Zinc Oxide Ointment 30g tube 1 x Per Week/15 Days Discharge Instructions: Apply Zinc Oxide to periwound with each dressing change Peri-Wound Care: Sween Lotion (Moisturizing lotion) 1 x Per Week/15 Days Discharge Instructions: Apply moisturizing lotion as directed Topical: Gentamicin 1 x Per Week/15 Days Discharge Instructions: Apply Gentamicin under Hydrofera Prim Dressing: Hydrofera Blue Classic Foam, 2x2 in 1 x Per Week/15 Days ary Discharge Instructions: Moisten with saline prior to applying to wound bed Secondary Dressing:  Woven Gauze Sponge, Non-Sterile 4x4 in 1 x Per Week/15 Days Discharge Instructions: Apply over primary dressing as directed. Secondary Dressing: ABD  Pad, 5x9 1 x Per Week/15 Days Discharge Instructions: Apply over primary dressing as directed. Compression Wrap: ThreePress (3 layer compression wrap) 1 x Per Week/15 Days Discharge Instructions: Apply three layer compression as directed. Electronic Signature(s) Signed: 02/28/2021 1:27:06 PM By: [name] Shan DO Entered By: [name] on 02/28/2021 13:23:56 -------------------------------------------------------------------------------- Problem List Details Patient Name: Date of Service: [name], [name] 02/28/2021 11:15 A M Medical Record Number: [id number] Patient Account Number: [id number] Date of Birth/Sex: Treating RN: 12-27-1935 (85 y.o. [name] Primary Care Provider: Kathlene [name] Other Clinician: Referring Provider: Treating Provider/Extender: [name] in Treatment: 4 Active Problems ICD-10 Encounter Code Description Active Date MDM Diagnosis L97.819 Non-pressure chronic ulcer of other part of right lower leg with unspecified 01/30/2021 No Yes severity I87.2 Venous insufficiency (chronic) (peripheral) 01/30/2021 No Yes I25.10 Atherosclerotic heart disease of native coronary artery without angina pectoris 01/30/2021 No Yes I48.0 Paroxysmal atrial fibrillation 01/30/2021 No Yes Z79.01 Long term (current) use of anticoagulants 01/30/2021 No Yes Inactive Problems Resolved Problems Electronic Signature(s) Signed: 02/28/2021 1:27:06 PM By: [name] Shan DO Entered By: [name] on 02/28/2021 13:18:14 -------------------------------------------------------------------------------- Progress Note Details Patient Name: Date of Service: [name], [name] 02/28/2021 11:15 A M Medical Record Number: [id number] Patient Account Number: [id number] Date of Birth/Sex: Treating RN: [birth year] (85  y.o. [name] Primary Care Provider: Kathlene [name] Other Clinician: Referring Provider: Treating Provider/Extender: [name] in Treatment: 4 Subjective Chief Complaint Information obtained from Patient Right lower extremity wound History of Present Illness (HPI) [name] is an 85 year old male with a past medical history of CAD s/p DES to LCx 2006, carotid artery disease, hypertension, hyperlipidemia, paroxysmal atrial fibrillation on Eliquis and Cardizem that presents today for right lower extremity wound. He was seen by [name], his primary care provider on 4/29 for this issue and was referred to our clinic. Patient states that he had a small wound that spontaneously started in October 2021 and has not healed. He states this has progressively gotten larger. He has been using acne cream prescribed by the dermatologist for this issue. He reports drainage to the wound but no purulent drainage. He reports mild soreness to the wound. He has swelling in his right leg greater than left and reports this is a chronic issue for the past 1 to 2 years. He denies resting leg pain or pain with ambulation. Of note He was prescribed doxycycline at the end of last month and has finished this course for possible skin infection to the wound site. 02/06/2021; I am seeing this patient who was admitted to the clinic last week by Dr. Heber [place]. He has predominantly I think a venous insufficiency wound on the right medial lower leg he has been using Santyl Hydrofera Blue under 3 layer compression. Arterial studies were ordered last week but do not seem to been put through. He is not a diabetic. He did have venous reflux studies done in August 2020. He did have abnormal reflux time was noted in the great saphenous vein in the distal thigh and the great saphenous vein at the knee. There was no evidence of DVT or SVT at the time he was not felt to have large enough saphenous veins on either  side for intervention. Compression stockings at least knee-high were recommended. Follow-up was on a as needed basis with [name] The patient does not describe claudication but his pulses in his feet are not vibrant this could be because  of the swelling 5/26; patient presents for 1 week follow-up. He has been using Hydrofera Blue under 3 layer compression. He had arterial studies done. He has no issues or complaints today. He denies signs of infection. He has his juxta light compressions with him today. 6/3; patient presents for 1 week follow-up. He has been using Hydrofera Blue under 3 layer compression. He denies signs and symptoms of infection. He did have bright green drainage which he states he has not seen before when the dressing was taken off today. He is using his juxta light compression to the left leg. 6/10; patient presents for 1 week follow-up. He has been tolerating Hydrofera Blue with 3 layer compression. He again reports bright green drainage. However he denies any signs of infection. He has been using juxta light compression to the left leg. Patient History Information obtained from Patient. Family History Cancer - Father, Diabetes - Maternal Grandparents, Heart Disease - Maternal Grandparents, Hypertension - Maternal Grandparents, No family history of Hereditary Spherocytosis, Kidney Disease, Lung Disease, Seizures, Stroke, Thyroid Problems, Tuberculosis. Social History Former smoker - Quit over 30 years ago, Marital Status - Married, Alcohol Use - Rarely, Drug Use - No History, Caffeine Use - Daily. Medical History Eyes Patient has history of Glaucoma Cardiovascular Patient has history of Coronary Artery Disease, Hypertension, Peripheral Venous Disease Integumentary (Skin) Denies history of History of Burn Musculoskeletal Patient has history of Osteoarthritis Medical A Surgical History Notes nd Endocrine Hypothyroidism Genitourinary BPH Musculoskeletal Bilat Hip  Replacements, Right Knee Replacement Oncologic Skin Cancer multiple sites Objective Constitutional respirations regular, non-labored and within target range for patient.. Vitals Time Taken: 11:22 AM, Temperature: 98.3 F, Pulse: 60 bpm, Respiratory Rate: 16 breaths/min, Blood Pressure: 131/64 mmHg. General Notes: Right lower extremity: Open wound with granulation tissue and epithelialization tissue throughout. No obvious signs of infection. Improvement from the last clinic visit. Integumentary (Hair, Skin) Wound #1 status is Open. Original cause of wound was Gradually Appeared. The date acquired was: 07/02/2020. The wound has been in treatment 4 [name] The wound is located on the Right,Medial Lower Leg. The wound measures 1.5cm length x 2.1cm width x 0.1cm depth; 2.474cm^2 area and 0.247cm^3 volume. There is Fat Layer (Subcutaneous Tissue) exposed. There is no tunneling or undermining noted. There is a medium amount of serosanguineous drainage noted. The wound margin is distinct with the outline attached to the wound base. There is large (67-100%) red, pink granulation within the wound bed. There is a small (1-33%) amount of necrotic tissue within the wound bed including Adherent Slough. Assessment Active Problems ICD-10 Non-pressure chronic ulcer of other part of right lower leg with unspecified severity Venous insufficiency (chronic) (peripheral) Atherosclerotic heart disease of native coronary artery without angina pectoris Paroxysmal atrial fibrillation Long term (current) use of anticoagulants Procedures Wound #1 Pre-procedure diagnosis of Wound #1 is a Venous Leg Ulcer located on the Right,Medial Lower Leg . There was a Three Layer Compression Therapy Procedure by Lorrin Jackson, RN. Post procedure Diagnosis Wound #1: Same as Pre-Procedure Patient's wound is improving very nicely. Since he is still having some bright green drainage on the dressing I will continue with gentamicin. I  think he has been doing well with Hydrofera Blue and I will continue this with 3 layer compression. He will follow-up next week for wrap change. Plan Follow-up Appointments: Return Appointment in 2 [name] - with Dr. Heber [place] Nurse Visit: - Wrap Change Bathing/ Shower/ Hygiene: May shower with protection but do not get wound dressing(s) wet. -  May use cast protectors to cover your wrap...you can get those at [facility] or [facility]. Edema Control - Lymphedema / SCD / Other: Elevate legs to the level of the heart or above for 30 minutes daily and/or when sitting, a frequency of: - 3-4 times a throughout the day. Avoid standing for long periods of time. Exercise regularly Moisturize legs daily. - lotion left leg every night before bed. Compression stocking or Garment 30-40 mm/Hg pressure to: - keep one juxtalite compression garment in car bring in weekly for right leg. ****Wound center to apply and demonstrate how to apply juxtatlite to left leg. Patient to apply in the morning and remove at night to left leg. WOUND #1: - Lower Leg Wound Laterality: Right, Medial Cleanser: Soap and Water 1 x Per Week/15 Days Discharge Instructions: May shower and wash wound with dial antibacterial soap and water prior to dressing change. Cleanser: Wound Cleanser 1 x Per Week/15 Days Discharge Instructions: Cleanse the wound with wound cleanser prior to applying a clean dressing using gauze sponges, not tissue or cotton balls. Peri-Wound Care: Triamcinolone 15 (g) 1 x Per Week/15 Days Discharge Instructions: ****Liberally****Use triamcinolone 15 (g) as directed Peri-Wound Care: Zinc Oxide Ointment 30g tube 1 x Per Week/15 Days Discharge Instructions: Apply Zinc Oxide to periwound with each dressing change Peri-Wound Care: Sween Lotion (Moisturizing lotion) 1 x Per Week/15 Days Discharge Instructions: Apply moisturizing lotion as directed Topical: Gentamicin 1 x Per Week/15 Days Discharge Instructions: Apply Gentamicin  under Hydrofera Prim Dressing: Hydrofera Blue Classic Foam, 2x2 in 1 x Per Week/15 Days ary Discharge Instructions: Moisten with saline prior to applying to wound bed Secondary Dressing: Woven Gauze Sponge, Non-Sterile 4x4 in 1 x Per Week/15 Days Discharge Instructions: Apply over primary dressing as directed. Secondary Dressing: ABD Pad, 5x9 1 x Per Week/15 Days Discharge Instructions: Apply over primary dressing as directed. Com pression Wrap: ThreePress (3 layer compression wrap) 1 x Per Week/15 Days Discharge Instructions: Apply three layer compression as directed. 1. Gentamicin, Hydrofera Blue under 3 layer compression 2. Follow-up next week Electronic Signature(s) Signed: 02/28/2021 1:27:06 PM By: [name] Shan DO Entered By: [name] on 02/28/2021 13:25:19 -------------------------------------------------------------------------------- HxROS Details Patient Name: Date of Service: [name], [name] 02/28/2021 11:15 A M Medical Record Number: [id number] Patient Account Number: [id number] Date of Birth/Sex: Treating RN: [birth year] (85 y.o. [name] Primary Care Provider: Kathlene [name] Other Clinician: Referring Provider: Treating Provider/Extender: [name] in Treatment: 4 Information Obtained From Patient Eyes Medical History: Positive for: Glaucoma Cardiovascular Medical History: Positive for: Coronary Artery Disease; Hypertension; Peripheral Venous Disease Endocrine Medical History: Past Medical History Notes: Hypothyroidism Genitourinary Medical History: Past Medical History Notes: BPH Integumentary (Skin) Medical History: Negative for: History of Burn Musculoskeletal Medical History: Positive for: Osteoarthritis Past Medical History Notes: Bilat Hip Replacements, Right Knee Replacement Oncologic Medical History: Past Medical History Notes: Skin Cancer multiple sites HBO Extended History  Items Eyes: Glaucoma Immunizations Pneumococcal Vaccine: Received Pneumococcal Vaccination: No Implantable Devices None Family and Social History Cancer: Yes - Father; Diabetes: Yes - Maternal Grandparents; Heart Disease: Yes - Maternal Grandparents; Hereditary Spherocytosis: No; Hypertension: Yes - Maternal Grandparents; Kidney Disease: No; Lung Disease: No; Seizures: No; Stroke: No; Thyroid Problems: No; Tuberculosis: No; Former smoker - Quit over 30 years ago; Marital Status - Married; Alcohol Use: Rarely; Drug Use: No History; Caffeine Use: Daily; Financial Concerns: No; Food, Clothing or Shelter Needs: No; Support System Lacking: No; Transportation Concerns: No Electronic Signature(s) Signed: 02/28/2021  1:27:06 PM By: [name] Shan DO Signed: 02/28/2021 5:10:19 PM By: Lorrin Jackson Entered By: [name] on 02/28/2021 13:19:26 -------------------------------------------------------------------------------- SuperBill Details Patient Name: Date of Service: [name], [name] 02/28/2021 Medical Record Number: [id number] Patient Account Number: [id number] Date of Birth/Sex: Treating RN: Oct 06, 1935 (85 y.o. [name] Primary Care Provider: Kathlene [name] Other Clinician: Referring Provider: Treating Provider/Extender: [name] in Treatment: 4 Diagnosis Coding ICD-10 Codes Code Description [phone] Non-pressure chronic ulcer of other part of right lower leg with unspecified severity I87.2 Venous insufficiency (chronic) (peripheral) I25.10 Atherosclerotic heart disease of native coronary artery without angina pectoris I48.0 Paroxysmal atrial fibrillation Z79.01 Long term (current) use of anticoagulants Facility Procedures CPT4 Code: [id number] Description: (Facility Use Only) [phone] - [facility] LWR RT LEG ICD-10 Diagnosis Description I87.2 Venous insufficiency (chronic) (peripheral) L97.819 Non-pressure chronic ulcer of other part of  right lower leg with unspecified severit Modifier: y Quantity: 1 Physician Procedures : CPT4 Code Description Modifier [id number] - WC PHYS LEVEL 3 - EST PT ICD-10 Diagnosis Description L97.819 Non-pressure chronic ulcer of other part of right lower leg with unspecified severity I87.2 Venous insufficiency (chronic) (peripheral) Quantity: 1 Electronic Signature(s) Signed: 02/28/2021 1:27:06 PM By: [name] Shan DO Entered By: [name] on 02/28/2021 13:25:45

## 2021-02-28 NOTE — Progress Notes (Signed)
[name], LEFFLER ([id number]) Visit Report for 02/28/2021 Arrival Information Details Patient Name: Date of Service: [name], [place] RD E. 02/28/2021 11:15 A M Medical Record Number: [id number] Patient Account Number: [id number] Date of Birth/Sex: Treating RN: September 08, 1936 (85 y.o. [name] Primary Care Avleen Bordwell: [name] Other Clinician: Referring Joey Hudock: Treating Jermery Caratachea/Extender: [name]: 4 Visit Information History Since Last Visit Added or deleted any medications: No Patient Arrived: [name] Any new allergies or adverse reactions: No Arrival Time: 11:22 Had a fall or experienced change in No Accompanied By: wife activities of daily living that may affect Transfer Assistance: None risk of falls: Patient Identification Verified: Yes Signs or symptoms of abuse/neglect since last visito No Secondary Verification Process Completed: Yes Hospitalized since last visit: No Patient Requires Transmission-Based Precautions: No Implantable device outside of the clinic excluding No Patient Has Alerts: Yes cellular tissue based products placed in the center Patient Alerts: Patient on Blood Thinner since last visit: Has Dressing in Place as Prescribed: Yes Pain Present Now: No Electronic Signature(s) Signed: 02/28/2021 3:50:24 PM By: Sandre Kitty Entered By: Sandre Kitty on 02/28/2021 11:22:28 -------------------------------------------------------------------------------- Compression Therapy Details Patient Name: Date of Service: [name], [name] 02/28/2021 11:15 A M Medical Record Number: [id number] Patient Account Number: [id number] Date of Birth/Sex: Treating RN: [birth year] (85 y.o. [name] Primary Care Aliviyah Malanga: [name] Other Clinician: Referring Talishia Betzler: Treating Coley Kulikowski/Extender: [name]: 4 Compression Therapy Performed for Wound Assessment: Wound #1 Right,Medial Lower  Leg Performed By: Clinician Lorrin Jackson, RN Compression Type: Three Layer Post Procedure Diagnosis Same as Pre-procedure Electronic Signature(s) Signed: 02/28/2021 5:10:19 PM By: Lorrin Jackson Entered By: Lorrin Jackson on 02/28/2021 11:44:26 -------------------------------------------------------------------------------- Encounter Discharge Information Details Patient Name: Date of Service: [name], [name] 02/28/2021 11:15 A M Medical Record Number: [id number] Patient Account Number: [id number] Date of Birth/Sex: Treating RN: 02/19/1936 (85 y.o. [name] Primary Care Monica Zahler: [name] Other Clinician: Referring Jadzia Ibsen: Treating Carolie Mcilrath/Extender: [name]: 4 Encounter Discharge Information Items Discharge Condition: Stable Ambulatory Status: Ambulatory Discharge Destination: Home Transportation: Private Auto Accompanied By: wife Schedule Follow-up Appointment: Yes Clinical Summary of Care: Electronic Signature(s) Signed: 02/28/2021 6:16:40 PM By: [name] Entered By: [name] on 02/28/2021 14:08:55 -------------------------------------------------------------------------------- Lower Extremity Assessment Details Patient Name: Date of Service: [name], [name] 02/28/2021 11:15 A M Medical Record Number: [id number] Patient Account Number: [id number] Date of Birth/Sex: Treating RN: [birth year] (85 y.o. [name] Primary Care Kenyen Candy: [name] Other Clinician: Referring Ahmani Daoud: Treating Zacherie Honeyman/Extender: [name]: 4 Edema Assessment Assessed: [Left: No] [Right: Yes] Edema: [Left: N] [Right: o] Calf Left: Right: Point of Measurement: 31 cm From Medial Instep 40 cm Ankle Left: Right: Point of Measurement: 9 cm From Medial Instep 23 cm Vascular Assessment Pulses: Dorsalis Pedis Palpable: [Right:Yes] Electronic Signature(s) Signed: 02/28/2021 6:16:40 PM  By: [name] Entered By: [name] on 02/28/2021 11:36:50 -------------------------------------------------------------------------------- Multi Wound Chart Details Patient Name: Date of Service: [name], [name] 02/28/2021 11:15 A M Medical Record Number: [id number] Patient Account Number: [id number] Date of Birth/Sex: Treating RN: 1936/04/13 (85 y.o. [name] Primary Care Pernell Dikes: [name] Other Clinician: Referring Chia Mowers: Treating Lional Icenogle/Extender: [name]: 4 Vital Signs Height(in): Pulse(bpm): 60 Weight(lbs): Blood Pressure(mmHg): 131/64 Body Mass Index(BMI): Temperature(F): 98.3 Respiratory Rate(breaths/min): 16 Photos: [1:No Photos Right, Medial Lower Leg] [N/A:N/A N/A] Wound Location: [  1:Gradually Appeared] [N/A:N/A] Wounding Event: [1:Venous Leg Ulcer] [N/A:N/A] Primary Etiology: [1:Glaucoma, Coronary Artery Disease, N/A] Comorbid History: [1:Hypertension, Peripheral Venous Disease, Osteoarthritis 07/02/2020] [N/A:N/A] Date Acquired: [1:4] [N/A:N/A] Weeks of Treatment: [1:Open] [N/A:N/A] Wound Status: [1:1.5x2.1x0.1] [N/A:N/A] Measurements L x W x D (cm) [1:2.474] [N/A:N/A] A (cm) : rea [1:0.247] [N/A:N/A] Volume (cm) : [1:96.30%] [N/A:N/A] % Reduction in Area: [1:96.30%] [N/A:N/A] % Reduction in Volume: [1:Full Thickness Without Exposed] [N/A:N/A] Classification: [1:Support Structures Medium] [N/A:N/A] Exudate Amount: [1:Serosanguineous] [N/A:N/A] Exudate Type: [1:red, brown] [N/A:N/A] Exudate Color: [1:Distinct, outline attached] [N/A:N/A] Wound Margin: [1:Large (67-100%)] [N/A:N/A] Granulation Amount: [1:Red, Pink] [N/A:N/A] Granulation Quality: [1:Small (1-33%)] [N/A:N/A] Necrotic Amount: [1:Fat Layer (Subcutaneous Tissue): Yes N/A] Exposed Structures: [1:Fascia: No Tendon: No Muscle: No Joint: No Bone: No Large (67-100%)] [N/A:N/A] Epithelialization: [1:Compression Therapy]  [N/A:N/A] Treatment Notes Electronic Signature(s) Signed: 02/28/2021 1:27:06 PM By: Kalman Shan DO Signed: 02/28/2021 5:10:19 PM By: Lorrin Jackson Entered By: Kalman Shan on 02/28/2021 13:18:21 -------------------------------------------------------------------------------- Multi-Disciplinary Care Plan Details Patient Name: Date of Service: [name], [name] 02/28/2021 11:15 A M Medical Record Number: [id number] Patient Account Number: [id number] Date of Birth/Sex: Treating RN: 11-29-1935 (85 y.o. [name] Primary Care Shantavia Jha: [name] Other Clinician: Referring Sharren Schnurr: Treating Gurbani Figge/Extender: [name]: 4 Active Inactive Wound/Skin Impairment Nursing Diagnoses: Impaired tissue integrity Knowledge deficit related to ulceration/compromised skin integrity Goals: Patient will have a decrease in wound volume by X% from date: (specify in notes) Date Initiated: 01/30/2021 Target Resolution Date: 03/21/2021 Goal Status: Active Patient/caregiver will verbalize understanding of skin care regimen Date Initiated: 01/30/2021 Date Inactivated: 02/13/2021 Target Resolution Date: 02/13/2021 Goal Status: Met Ulcer/skin breakdown will have a volume reduction of 30% by week 4 Date Initiated: 01/30/2021 Date Inactivated: 02/28/2021 Target Resolution Date: 02/28/2021 Goal Status: Met Ulcer/skin breakdown will have a volume reduction of 80% by week 12 Date Initiated: 02/28/2021 Target Resolution Date: 03/28/2021 Goal Status: Active Interventions: Assess patient/caregiver ability to obtain necessary supplies Assess patient/caregiver ability to perform ulcer/skin care regimen upon admission and as needed Assess ulceration(s) every visit Provide education on ulcer and skin care Notes: 02/28/21: Wound greater than 30% volume reduction, new goal initiated. Electronic Signature(s) Signed: 02/28/2021 12:35:16 PM By: Lorrin Jackson Entered By:  Lorrin Jackson on 02/28/2021 12:35:15 -------------------------------------------------------------------------------- Pain Assessment Details Patient Name: Date of Service: [name], [name] 02/28/2021 11:15 A M Medical Record Number: [id number] Patient Account Number: [id number] Date of Birth/Sex: Treating RN: [birth year] (85 y.o. [name] Primary Care Likisha Alles: [name] Other Clinician: Referring Maeson Purohit: Treating Zeplin Aleshire/Extender: [name]: 4 Active Problems Location of Pain Severity and Description of Pain Patient Has Paino No Site Locations Pain Management and Medication Current Pain Management: Electronic Signature(s) Signed: 02/28/2021 3:50:24 PM By: Sandre Kitty Signed: 02/28/2021 5:10:19 PM By: Lorrin Jackson Entered By: Sandre Kitty on 02/28/2021 11:24:12 -------------------------------------------------------------------------------- Patient/Caregiver Education Details Patient Name: Date of Service: [name], [name] 6/10/2022andnbsp11:15 A M Medical Record Number: [id number] Patient Account Number: [id number] Date of Birth/Gender: Treating RN: [birth year] (85 y.o. [name] Primary Care Physician: [name] Other Clinician: Referring Physician: Treating Physician/Extender: [name] in Treatment: 4 Education Assessment Education Provided To: Patient Education Topics Provided Venous: Methods: Explain/Verbal, Printed Responses: State content correctly Wound/Skin Impairment: Methods: Explain/Verbal, Printed Responses: State content correctly Electronic Signature(s) Signed: 02/28/2021 5:10:19 PM By: Lorrin Jackson Entered By: Lorrin Jackson on 02/28/2021 11:42:59 -------------------------------------------------------------------------------- Wound Assessment Details Patient Name: Date of Service: [name], EDWA  RD E. 02/28/2021 11:15 A M Medical Record Number:  [id number] Patient Account Number: [id number] Date of Birth/Sex: Treating RN: 29-Dec-1935 (85 y.o. [name] Primary Care Vyron Fronczak: [name] Other Clinician: Referring Reiss Mowrey: Treating Alonzo Owczarzak/Extender: [name]: 4 Wound Status Wound Number: 1 Primary Venous Leg Ulcer Etiology: Wound Location: Right, Medial Lower Leg Wound Open Wounding Event: Gradually Appeared Status: Date Acquired: 07/02/2020 Comorbid Glaucoma, Coronary Artery Disease, Hypertension, Peripheral Weeks Of Treatment: 4 History: Venous Disease, Osteoarthritis Clustered Wound: No Photos Wound Measurements Length: (cm) 1.5 Width: (cm) 2.1 Depth: (cm) 0.1 Area: (cm) 2.474 Volume: (cm) 0.247 % Reduction in Area: 96.3% % Reduction in Volume: 96.3% Epithelialization: Large (67-100%) Tunneling: No Undermining: No Wound Description Classification: Full Thickness Without Exposed Support Structures Wound Margin: Distinct, outline attached Exudate Amount: Medium Exudate Type: Serosanguineous Exudate Color: red, brown Foul Odor After Cleansing: No Slough/Fibrino Yes Wound Bed Granulation Amount: Large (67-100%) Exposed Structure Granulation Quality: Red, Pink Fascia Exposed: No Necrotic Amount: Small (1-33%) Fat Layer (Subcutaneous Tissue) Exposed: Yes Necrotic Quality: Adherent Slough Tendon Exposed: No Muscle Exposed: No Joint Exposed: No Bone Exposed: No Treatment Notes Wound #1 (Lower Leg) Wound Laterality: Right, Medial Cleanser Wound Cleanser Discharge Instruction: Cleanse the wound with wound cleanser prior to applying a clean dressing using gauze sponges, not tissue or cotton balls. Soap and Water Discharge Instruction: May shower and wash wound with dial antibacterial soap and water prior to dressing change. Peri-Wound Care Triamcinolone 15 (g) Discharge Instruction: ****Liberally****Use triamcinolone 15 (g) as directed Sween Lotion (Moisturizing  lotion) Discharge Instruction: Apply moisturizing lotion as directed Zinc Oxide Ointment 30g tube Discharge Instruction: Apply Zinc Oxide to periwound with each dressing change Topical Gentamicin Discharge Instruction: Apply Gentamicin under Hydrofera Primary Dressing Hydrofera Blue Classic Foam, 2x2 in Discharge Instruction: Moisten with saline prior to applying to wound bed Secondary Dressing Woven Gauze Sponge, Non-Sterile 4x4 in Discharge Instruction: Apply over primary dressing as directed. ABD Pad, 5x9 Discharge Instruction: Apply over primary dressing as directed. Secured With Compression Wrap ThreePress (3 layer compression wrap) Discharge Instruction: Apply three layer compression as directed. Compression Stockings Add-Ons Electronic Signature(s) Signed: 02/28/2021 4:58:44 PM By: Sandre Kitty Signed: 02/28/2021 6:16:40 PM By: [name] Entered By: Sandre Kitty on 02/28/2021 16:45:41 -------------------------------------------------------------------------------- Vitals Details Patient Name: Date of Service: [name], [name] 02/28/2021 11:15 A M Medical Record Number: [id number] Patient Account Number: [id number] Date of Birth/Sex: Treating RN: May 10, 1936 (85 y.o. [name] Primary Care Shaarav Ripple: [name] Other Clinician: Referring Nalani Andreen: Treating Nataki Mccrumb/Extender: [name]: 4 Vital Signs Time Taken: 11:22 Temperature (F): 98.3 Pulse (bpm): 60 Respiratory Rate (breaths/min): 16 Blood Pressure (mmHg): 131/64 Reference Range: 80 - 120 mg / dl Electronic Signature(s) Signed: 02/28/2021 3:50:24 PM By: Sandre Kitty Entered By: Sandre Kitty on 02/28/2021 11:24:04

## 2021-03-07 ENCOUNTER — Encounter (HOSPITAL_BASED_OUTPATIENT_CLINIC_OR_DEPARTMENT_OTHER): Payer: Medicare Other | Admitting: Internal Medicine

## 2021-03-07 ENCOUNTER — Other Ambulatory Visit: Payer: Self-pay

## 2021-03-07 DIAGNOSIS — L97819 Non-pressure chronic ulcer of other part of right lower leg with unspecified severity: Secondary | ICD-10-CM | POA: Diagnosis not present

## 2021-03-07 DIAGNOSIS — I890 Lymphedema, not elsewhere classified: Secondary | ICD-10-CM | POA: Diagnosis not present

## 2021-03-07 DIAGNOSIS — Z833 Family history of diabetes mellitus: Secondary | ICD-10-CM | POA: Diagnosis not present

## 2021-03-07 DIAGNOSIS — I2510 Atherosclerotic heart disease of native coronary artery without angina pectoris: Secondary | ICD-10-CM | POA: Diagnosis not present

## 2021-03-07 DIAGNOSIS — I480 Paroxysmal atrial fibrillation: Secondary | ICD-10-CM | POA: Diagnosis not present

## 2021-03-07 DIAGNOSIS — Z87891 Personal history of nicotine dependence: Secondary | ICD-10-CM | POA: Diagnosis not present

## 2021-03-07 DIAGNOSIS — I872 Venous insufficiency (chronic) (peripheral): Secondary | ICD-10-CM | POA: Diagnosis not present

## 2021-03-10 NOTE — Progress Notes (Signed)
CARLOUS, [name] ([id number]) Visit Report for 03/07/2021 Arrival Information Details Patient Name: Date of Service: [name], [place] RD E. 03/07/2021 10:30 A M Medical Record Number: [id number] Patient Account Number: [id number] Date of Birth/Sex: Treating RN: 1936/06/14 (85 y.o. [name] Primary Care Bayla Mcgovern: [name] Other Clinician: Referring Loryn Haacke: Treating Antony Sian/Extender: Carlyle Basques in Treatment: 5 Visit Information History Since Last Visit Added or deleted any medications: No Patient Arrived: Ambulatory Any new allergies or adverse reactions: No Arrival Time: 11:35 Had a fall or experienced change in No Accompanied By: wife activities of daily living that may affect Transfer Assistance: None risk of falls: Patient Identification Verified: Yes Signs or symptoms of abuse/neglect since last visito No Secondary Verification Process Completed: Yes Hospitalized since last visit: No Patient Requires Transmission-Based Precautions: No Implantable device outside of the clinic excluding No Patient Has Alerts: Yes cellular tissue based products placed in the center Patient Alerts: Patient on Blood Thinner since last visit: Has Dressing in Place as Prescribed: Yes Has Compression in Place as Prescribed: Yes Pain Present Now: No Electronic Signature(s) Signed: 03/10/2021 5:37:12 PM By: Levan Hurst RN, BSN Entered By: Levan Hurst on 03/07/2021 16:45:03 -------------------------------------------------------------------------------- Compression Therapy Details Patient Name: Date of Service: [name], [name] 03/07/2021 10:30 A M Medical Record Number: [id number] Patient Account Number: [id number] Date of Birth/Sex: Treating RN: 07/20/1936 (85 y.o. [name] Primary Care Virgilene Stryker: [name] Other Clinician: Referring Ruqaya Strauss: Treating Denson Niccoli/Extender: Carlyle Basques in Treatment: 5 Compression Therapy Performed for  Wound Assessment: Wound #1 Right,Medial Lower Leg Performed By: Clinician Levan Hurst, RN Compression Type: Three Hydrologist) Signed: 03/10/2021 5:37:12 PM By: Levan Hurst RN, BSN Entered By: Levan Hurst on 03/07/2021 16:45:48 -------------------------------------------------------------------------------- Encounter Discharge Information Details Patient Name: Date of Service: [name], [name] 03/07/2021 10:30 A M Medical Record Number: [id number] Patient Account Number: [id number] Date of Birth/Sex: Treating RN: [birth year] (85 y.o. [name] Primary Care Chanise Habeck: [name] Other Clinician: Referring Idalys Konecny: Treating Marchell Froman/Extender: Carlyle Basques in Treatment: 5 Encounter Discharge Information Items Discharge Condition: Stable Ambulatory Status: Ambulatory Discharge Destination: Home Transportation: Private Auto Accompanied By: wife Schedule Follow-up Appointment: Yes Clinical Summary of Care: Patient Declined Electronic Signature(s) Signed: 03/10/2021 5:37:12 PM By: Levan Hurst RN, BSN Entered By: Levan Hurst on 03/07/2021 16:46:25 -------------------------------------------------------------------------------- Wound Assessment Details Patient Name: Date of Service: [name], [name] 03/07/2021 10:30 A M Medical Record Number: [id number] Patient Account Number: [id number] Date of Birth/Sex: Treating RN: 1936-08-22 (85 y.o. [name] Primary Care Connery Shiffler: [name] Other Clinician: Referring Bohdan Macho: Treating Kasheem Toner/Extender: Carlyle Basques in Treatment: 5 Wound Status Wound Number: 1 Primary Venous Leg Ulcer Etiology: Wound Location: Right, Medial Lower Leg Wound Open Wounding Event: Gradually Appeared Status: Date Acquired: 07/02/2020 Comorbid Glaucoma, Coronary Artery Disease, Hypertension, Peripheral Weeks Of Treatment: 5 History: Venous Disease,  Osteoarthritis Clustered Wound: No Wound Measurements Length: (cm) 1.5 Width: (cm) 2.1 Depth: (cm) 0.1 Area: (cm) 2.474 Volume: (cm) 0.247 % Reduction in Area: 96.3% % Reduction in Volume: 96.3% Epithelialization: Large (67-100%) Tunneling: No Undermining: No Wound Description Classification: Full Thickness Without Exposed Support Structures Wound Margin: Distinct, outline attached Exudate Amount: Medium Exudate Type: Serosanguineous Exudate Color: red, brown Foul Odor After Cleansing: No Slough/Fibrino Yes Wound Bed Granulation Amount: Large (67-100%) Exposed Structure Granulation Quality: Red, Pink Fascia Exposed: No Necrotic Amount: Small (1-33%) Fat Layer (Subcutaneous Tissue) Exposed: Yes Necrotic Quality: Adherent Slough  Tendon Exposed: No Muscle Exposed: No Joint Exposed: No Bone Exposed: No Treatment Notes Wound #1 (Lower Leg) Wound Laterality: Right, Medial Cleanser Soap and Water Discharge Instruction: May shower and wash wound with dial antibacterial soap and water prior to dressing change. Wound Cleanser Discharge Instruction: Cleanse the wound with wound cleanser prior to applying a clean dressing using gauze sponges, not tissue or cotton balls. Peri-Wound Care Triamcinolone 15 (g) Discharge Instruction: ****Liberally****Use triamcinolone 15 (g) as directed Zinc Oxide Ointment 30g tube Discharge Instruction: Apply Zinc Oxide to periwound with each dressing change Sween Lotion (Moisturizing lotion) Discharge Instruction: Apply moisturizing lotion as directed Topical Gentamicin Discharge Instruction: Apply Gentamicin under Hydrofera Primary Dressing Hydrofera Blue Classic Foam, 2x2 in Discharge Instruction: Moisten with saline prior to applying to wound bed Secondary Dressing Woven Gauze Sponge, Non-Sterile 4x4 in Discharge Instruction: Apply over primary dressing as directed. ABD Pad, 5x9 Discharge Instruction: Apply over primary dressing as  directed. Secured With Compression Wrap ThreePress (3 layer compression wrap) Discharge Instruction: Apply three layer compression as directed. Compression Stockings Add-Ons Electronic Signature(s) Signed: 03/10/2021 5:37:12 PM By: Levan Hurst RN, BSN Entered By: Levan Hurst on 03/07/2021 16:45:36 -------------------------------------------------------------------------------- [place] Details Patient Name: Date of Service: [name], [name] 03/07/2021 10:30 A M Medical Record Number: [id number] Patient Account Number: [id number] Date of Birth/Sex: Treating RN: [birth year] (85 y.o. [name] Primary Care Banyan Goodchild: [name] Other Clinician: Referring Kennedee Kitzmiller: Treating Ysidro Ramsay/Extender: Carlyle Basques in Treatment: 5 Vital Signs Time Taken: 11:35 Temperature (F): 97.9 Pulse (bpm): 53 Respiratory Rate (breaths/min): 16 Blood Pressure (mmHg): 118/59 Reference Range: 80 - 120 mg / dl Electronic Signature(s) Signed: 03/10/2021 5:37:12 PM By: Levan Hurst RN, BSN Entered By: Levan Hurst on 03/07/2021 16:45:19

## 2021-03-10 NOTE — Progress Notes (Signed)
[name], [name] ([id number]) Visit Report for 03/07/2021 [place] Details Patient Name: Date of Service: [name], [name] 03/07/2021 Medical Record Number: [id number] Patient Account Number: [id number] Date of Birth/Sex: Treating RN: [birth year] (85 y.o. [name] Primary Care Provider: [name] Other Clinician: Referring Provider: Treating Provider/Extender: Carlyle Basques in Treatment: 5 Diagnosis Coding ICD-10 Codes Code Description [phone] Non-pressure chronic ulcer of other part of right lower leg with unspecified severity I87.2 Venous insufficiency (chronic) (peripheral) I25.10 Atherosclerotic heart disease of native coronary artery without angina pectoris I48.0 Paroxysmal atrial fibrillation Z79.01 Long term (current) use of anticoagulants Facility Procedures CPT4 Code Description Modifier Quantity [id number] (Facility Use Only) [phone] - APPLY MULTLAY COMPRS LWR RT LEG 1 Electronic Signature(s) Signed: 03/07/2021 5:27:23 PM By: Linton Ham MD Signed: 03/10/2021 5:37:12 PM By: Levan Hurst RN, BSN Entered By: Levan Hurst on 03/07/2021 16:46:35

## 2021-03-11 DIAGNOSIS — H2513 Age-related nuclear cataract, bilateral: Secondary | ICD-10-CM | POA: Diagnosis not present

## 2021-03-11 DIAGNOSIS — H401131 Primary open-angle glaucoma, bilateral, mild stage: Secondary | ICD-10-CM | POA: Diagnosis not present

## 2021-03-14 ENCOUNTER — Encounter (HOSPITAL_BASED_OUTPATIENT_CLINIC_OR_DEPARTMENT_OTHER): Payer: Medicare Other | Admitting: Internal Medicine

## 2021-03-14 ENCOUNTER — Other Ambulatory Visit: Payer: Self-pay

## 2021-03-14 DIAGNOSIS — I2510 Atherosclerotic heart disease of native coronary artery without angina pectoris: Secondary | ICD-10-CM | POA: Diagnosis not present

## 2021-03-14 DIAGNOSIS — L97819 Non-pressure chronic ulcer of other part of right lower leg with unspecified severity: Secondary | ICD-10-CM | POA: Diagnosis not present

## 2021-03-14 DIAGNOSIS — I890 Lymphedema, not elsewhere classified: Secondary | ICD-10-CM | POA: Diagnosis not present

## 2021-03-14 DIAGNOSIS — Z833 Family history of diabetes mellitus: Secondary | ICD-10-CM | POA: Diagnosis not present

## 2021-03-14 DIAGNOSIS — I480 Paroxysmal atrial fibrillation: Secondary | ICD-10-CM | POA: Diagnosis not present

## 2021-03-14 DIAGNOSIS — Z87891 Personal history of nicotine dependence: Secondary | ICD-10-CM | POA: Diagnosis not present

## 2021-03-14 DIAGNOSIS — I872 Venous insufficiency (chronic) (peripheral): Secondary | ICD-10-CM | POA: Diagnosis not present

## 2021-03-14 NOTE — Progress Notes (Signed)
ALBINO, BUFFORD ([id number]) Visit Report for 03/14/2021 Chief Complaint Document Details Patient Name: Date of Service: [name], [name] 03/14/2021 10:30 A M Medical Record Number: [id number] Patient Account Number: [id number] Date of Birth/Sex: Treating RN: [birth year] (85 y.o. [name] Primary Care Provider: [name] Other Clinician: Referring Provider: Treating Provider/Extender: [name]: 6 Information Obtained from: Patient Chief Complaint Right lower extremity wound Electronic Signature(s) Signed: 03/14/2021 11:51:59 AM By: Kalman Shan DO Entered By: Kalman Shan on 03/14/2021 11:45:47 -------------------------------------------------------------------------------- HPI Details Patient Name: Date of Service: [name], [name] 03/14/2021 10:30 A M Medical Record Number: [id number] Patient Account Number: [id number] Date of Birth/Sex: Treating RN: 1936/06/30 (85 y.o. [name] Primary Care Provider: [name] Other Clinician: Referring Provider: Treating Provider/Extender: [name]: 6 History of Present Illness HPI Description: [name] is an 85 year old male with a past medical history of CAD s/p DES to LCx 2006, carotid artery disease, hypertension, hyperlipidemia, paroxysmal atrial fibrillation on Eliquis and Cardizem that presents today for right lower extremity wound. He was seen by Dr. Larose Kells, his primary care provider on 4/29 for this issue and was referred to our clinic. Patient states that he had a small wound that spontaneously started in October 2021 and has not healed. He states this has progressively gotten larger. He has been using acne cream prescribed by the dermatologist for this issue. He reports drainage to the wound but no purulent drainage. He reports mild soreness to the wound. He has swelling in his right leg greater than left and reports this is a chronic  issue for the past 1 to 2 years. He denies resting leg pain or pain with ambulation. Of note He was prescribed doxycycline at the end of last month and has finished this course for possible skin infection to the wound site. 02/06/2021; I am seeing this patient who was admitted to the clinic last week by Dr. Heber Kemp. He has predominantly I think a venous insufficiency wound on the right medial lower leg he has been using Santyl Hydrofera Blue under 3 layer compression. Arterial studies were ordered last week but do not seem to been put through. He is not a diabetic. He did have venous reflux studies done in August 2020. He did have abnormal reflux time was noted in the great saphenous vein in the distal thigh and the great saphenous vein at the knee. There was no evidence of DVT or SVT at the time he was not felt to have large enough saphenous veins on either side for intervention. Compression stockings at least knee-high were recommended. Follow-up was on a as needed basis with Dr. Donzetta Matters The patient does not describe claudication but his pulses in his feet are not vibrant this could be because of the swelling 5/26; patient presents for 1 week follow-up. He has been using Hydrofera Blue under 3 layer compression. He had arterial studies done. He has no issues or complaints today. He denies signs of infection. He has his juxta light compressions with him today. 6/3; patient presents for 1 week follow-up. He has been using Hydrofera Blue under 3 layer compression. He denies signs and symptoms of infection. He did have bright green drainage which he states he has not seen before when the dressing was taken off today. He is using his juxta light compression to the left leg. 6/10; patient presents for 1 week follow-up. He has been tolerating Hydrofera Blue with  3 layer compression. He again reports bright green drainage. However he denies any signs of infection. He has been using juxta light compression to  the left leg. 6/24; patient presents for 2-week follow-up. He has been tolerating Hydrofera Blue with 3 layer compression. Today he is healed. He brought his juxta lite compression for the right leg and continues to wear the juxta light compression on the left leg Electronic Signature(s) Signed: 03/14/2021 11:51:59 AM By: Kalman Shan DO Entered By: Kalman Shan on 03/14/2021 11:47:14 -------------------------------------------------------------------------------- Physical Exam Details Patient Name: Date of Service: [name], [name] 03/14/2021 10:30 A M Medical Record Number: [id number] Patient Account Number: [id number] Date of Birth/Sex: Treating RN: 22-Mar-1936 (85 y.o. [name] Primary Care Provider: [name] Other Clinician: Referring Provider: Treating Provider/Extender: [name]: 6 Constitutional respirations regular, non-labored and within target range for patient.. Cardiovascular 2+ dorsalis pedis/posterior tibialis pulses. Psychiatric pleasant and cooperative. Notes Right lower extremity: Epithelialization tissue throughout the previously open wound. No signs of infection. Good edema control. Electronic Signature(s) Signed: 03/14/2021 11:51:59 AM By: Kalman Shan DO Entered By: Kalman Shan on 03/14/2021 11:47:53 -------------------------------------------------------------------------------- Physician Orders Details Patient Name: Date of Service: [name], [name] 03/14/2021 10:30 A M Medical Record Number: [id number] Patient Account Number: [id number] Date of Birth/Sex: Treating RN: 10-19-1935 (85 y.o. [name] Primary Care Provider: [name] Other Clinician: Referring Provider: Treating Provider/Extender: [name]: 6 Verbal / Phone Orders: No Diagnosis Coding ICD-10 Coding Code Description L97.819 Non-pressure chronic ulcer of other part of right lower leg  with unspecified severity I87.2 Venous insufficiency (chronic) (peripheral) I25.10 Atherosclerotic heart disease of native coronary artery without angina pectoris I48.0 Paroxysmal atrial fibrillation Z79.01 Long term (current) use of anticoagulants Discharge From [facility] Services Discharge from [place] Edema Control - Lymphedema / SCD / Other Elevate legs to the level of the heart or above for 30 minutes daily and/or when sitting, a frequency of: - throughout the day when sitting Avoid standing for long periods of time. Exercise regularly Moisturize legs daily. Compression stocking or Garment 30-40 mm/Hg pressure to: - Juxtalite to both legs daily Electronic Signature(s) Signed: 03/14/2021 11:51:59 AM By: Kalman Shan DO Entered By: Kalman Shan on 03/14/2021 11:48:28 -------------------------------------------------------------------------------- Problem List Details Patient Name: Date of Service: [name], [name] 03/14/2021 10:30 A M Medical Record Number: [id number] Patient Account Number: [id number] Date of Birth/Sex: Treating RN: 10-25-1935 (85 y.o. [name] Primary Care Provider: [name] Other Clinician: Referring Provider: Treating Provider/Extender: [name]: 6 Active Problems ICD-10 Encounter Code Description Active Date MDM Diagnosis L97.819 Non-pressure chronic ulcer of other part of right lower leg with unspecified 01/30/2021 No Yes severity I87.2 Venous insufficiency (chronic) (peripheral) 01/30/2021 No Yes I25.10 Atherosclerotic heart disease of native coronary artery without angina pectoris 01/30/2021 No Yes I48.0 Paroxysmal atrial fibrillation 01/30/2021 No Yes Z79.01 Long term (current) use of anticoagulants 01/30/2021 No Yes Inactive Problems Resolved Problems Electronic Signature(s) Signed: 03/14/2021 11:51:59 AM By: Kalman Shan DO Entered By: Kalman Shan on 03/14/2021  11:45:32 -------------------------------------------------------------------------------- Progress Note Details Patient Name: Date of Service: [name], [name] 03/14/2021 10:30 A M Medical Record Number: [id number] Patient Account Number: [id number] Date of Birth/Sex: Treating RN: 08/13/1936 (85 y.o. [name] Primary Care Provider: [name] Other Clinician: Referring Provider: Treating Provider/Extender: [name]: 6 Subjective Chief Complaint Information obtained from Patient  Right lower extremity wound History of Present Illness (HPI) [name] is an 85 year old male with a past medical history of CAD s/p DES to LCx 2006, carotid artery disease, hypertension, hyperlipidemia, paroxysmal atrial fibrillation on Eliquis and Cardizem that presents today for right lower extremity wound. He was seen by Dr. Larose Kells, his primary care provider on 4/29 for this issue and was referred to our clinic. Patient states that he had a small wound that spontaneously started in October 2021 and has not healed. He states this has progressively gotten larger. He has been using acne cream prescribed by the dermatologist for this issue. He reports drainage to the wound but no purulent drainage. He reports mild soreness to the wound. He has swelling in his right leg greater than left and reports this is a chronic issue for the past 1 to 2 years. He denies resting leg pain or pain with ambulation. Of note He was prescribed doxycycline at the end of last month and has finished this course for possible skin infection to the wound site. 02/06/2021; I am seeing this patient who was admitted to the clinic last week by Dr. Heber Kincaid. He has predominantly I think a venous insufficiency wound on the right medial lower leg he has been using Santyl Hydrofera Blue under 3 layer compression. Arterial studies were ordered last week but do not seem to been put through. He is not a  diabetic. He did have venous reflux studies done in August 2020. He did have abnormal reflux time was noted in the great saphenous vein in the distal thigh and the great saphenous vein at the knee. There was no evidence of DVT or SVT at the time he was not felt to have large enough saphenous veins on either side for intervention. Compression stockings at least knee-high were recommended. Follow-up was on a as needed basis with Dr. Donzetta Matters The patient does not describe claudication but his pulses in his feet are not vibrant this could be because of the swelling 5/26; patient presents for 1 week follow-up. He has been using Hydrofera Blue under 3 layer compression. He had arterial studies done. He has no issues or complaints today. He denies signs of infection. He has his juxta light compressions with him today. 6/3; patient presents for 1 week follow-up. He has been using Hydrofera Blue under 3 layer compression. He denies signs and symptoms of infection. He did have bright green drainage which he states he has not seen before when the dressing was taken off today. He is using his juxta light compression to the left leg. 6/10; patient presents for 1 week follow-up. He has been tolerating Hydrofera Blue with 3 layer compression. He again reports bright green drainage. However he denies any signs of infection. He has been using juxta light compression to the left leg. 6/24; patient presents for 2-week follow-up. He has been tolerating Hydrofera Blue with 3 layer compression. Today he is healed. He brought his juxta lite compression for the right leg and continues to wear the juxta light compression on the left leg Patient History Information obtained from Patient. Family History Cancer - Father, Diabetes - Maternal Grandparents, Heart Disease - Maternal Grandparents, Hypertension - Maternal Grandparents, No family history of Hereditary Spherocytosis, Kidney Disease, Lung Disease, Seizures, Stroke,  Thyroid Problems, Tuberculosis. Social History Former smoker - Quit over 30 years ago, Marital Status - Married, Alcohol Use - Rarely, Drug Use - No History, Caffeine Use - Daily. Medical History Eyes Patient has history of  Glaucoma Cardiovascular Patient has history of Coronary Artery Disease, Hypertension, Peripheral Venous Disease Integumentary (Skin) Denies history of History of Burn Musculoskeletal Patient has history of Osteoarthritis Medical A Surgical History Notes nd Endocrine Hypothyroidism Genitourinary BPH Musculoskeletal Bilat Hip Replacements, Right Knee Replacement Oncologic Skin Cancer multiple sites Objective Constitutional respirations regular, non-labored and within target range for patient.. Vitals Time Taken: 11:29 AM, Temperature: 98.5 F, Pulse: 59 bpm, Respiratory Rate: 16 breaths/min, Blood Pressure: 148/71 mmHg. Cardiovascular 2+ dorsalis pedis/posterior tibialis pulses. Psychiatric pleasant and cooperative. General Notes: Right lower extremity: Epithelialization tissue throughout the previously open wound. No signs of infection. Good edema control. Integumentary (Hair, Skin) Wound #1 status is Healed - Epithelialized. Original cause of wound was Gradually Appeared. The date acquired was: 07/02/2020. The wound has been in treatment 6 weeks. The wound is located on the Right,Medial Lower Leg. The wound measures 0cm length x 0cm width x 0cm depth; 0cm^2 area and 0cm^3 volume. There is no tunneling or undermining noted. There is a none present amount of drainage noted. The wound margin is distinct with the outline attached to the wound base. There is no granulation within the wound bed. There is no necrotic tissue within the wound bed. Assessment Active Problems ICD-10 Non-pressure chronic ulcer of other part of right lower leg with unspecified severity Venous insufficiency (chronic) (peripheral) Atherosclerotic heart disease of native coronary artery  without angina pectoris Paroxysmal atrial fibrillation Long term (current) use of anticoagulants Patient presents with a healed wound to his right lower extremity. This was a venous stasis ulcer. He brought his juxta lite compression and we will place this in office today. He can follow-up as needed. Plan Discharge From [facility] Services: Discharge from [place] Edema Control - Lymphedema / SCD / Other: Elevate legs to the level of the heart or above for 30 minutes daily and/or when sitting, a frequency of: - throughout the day when sitting Avoid standing for long periods of time. Exercise regularly Moisturize legs daily. Compression stocking or Garment 30-40 mm/Hg pressure to: - Juxtalite to both legs daily 1. Discharge from the clinic due to healed wound 2. Continue juxta lite compressions bilaterally, Daily 3. Follow-up as needed Electronic Signature(s) Signed: 03/14/2021 11:51:59 AM By: Kalman Shan DO Entered By: Kalman Shan on 03/14/2021 11:50:32 -------------------------------------------------------------------------------- HxROS Details Patient Name: Date of Service: [name], [name] 03/14/2021 10:30 A M Medical Record Number: [id number] Patient Account Number: [id number] Date of Birth/Sex: Treating RN: 1936/08/06 (85 y.o. [name] Primary Care Provider: [name] Other Clinician: Referring Provider: Treating Provider/Extender: [name] in Treatment: 6 Information Obtained From Patient Eyes Medical History: Positive for: Glaucoma Cardiovascular Medical History: Positive for: Coronary Artery Disease; Hypertension; Peripheral Venous Disease Endocrine Medical History: Past Medical History Notes: Hypothyroidism Genitourinary Medical History: Past Medical History Notes: BPH Integumentary (Skin) Medical History: Negative for: History of Burn Musculoskeletal Medical History: Positive for: Osteoarthritis Past Medical  History Notes: Bilat Hip Replacements, Right Knee Replacement Oncologic Medical History: Past Medical History Notes: Skin Cancer multiple sites HBO Extended History Items Eyes: Glaucoma Immunizations Pneumococcal Vaccine: Received Pneumococcal Vaccination: No Implantable Devices None Family and Social History Cancer: Yes - Father; Diabetes: Yes - Maternal Grandparents; Heart Disease: Yes - Maternal Grandparents; Hereditary Spherocytosis: No; Hypertension: Yes - Maternal Grandparents; Kidney Disease: No; Lung Disease: No; Seizures: No; Stroke: No; Thyroid Problems: No; Tuberculosis: No; Former smoker - Quit over 30 years ago; Marital Status - Married; Alcohol Use: Rarely; Drug Use: No History;  Caffeine Use: Daily; Financial Concerns: No; Food, Clothing or Shelter Needs: No; Support System Lacking: No; Transportation Concerns: No Electronic Signature(s) Signed: 03/14/2021 11:51:59 AM By: Kalman Shan DO Signed: 03/14/2021 6:17:32 PM By: Baruch Gouty RN, BSN Entered By: Kalman Shan on 03/14/2021 11:47:19 -------------------------------------------------------------------------------- [place] Details Patient Name: Date of Service: [name], [name] 03/14/2021 Medical Record Number: [id number] Patient Account Number: [id number] Date of Birth/Sex: Treating RN: [birth year] (85 y.o. [name] Primary Care Provider: [name] Other Clinician: Referring Provider: Treating Provider/Extender: [name]: 6 Diagnosis Coding ICD-10 Codes Code Description [phone] Non-pressure chronic ulcer of other part of right lower leg with unspecified severity I87.2 Venous insufficiency (chronic) (peripheral) I25.10 Atherosclerotic heart disease of native coronary artery without angina pectoris I48.0 Paroxysmal atrial fibrillation Z79.01 Long term (current) use of anticoagulants Facility Procedures CPT4 Code: [id number] Description: 99213 - WOUND  CARE VISIT-LEV 3 EST PT Modifier: Quantity: 1 Physician Procedures : CPT4 Code Description Modifier [id number] 11021 - WC PHYS LEVEL 3 - EST PT ICD-10 Diagnosis Description L97.819 Non-pressure chronic ulcer of other part of right lower leg with unspecified severity I87.2 Venous insufficiency (chronic) (peripheral) Quantity: 1 Electronic Signature(s) Signed: 03/14/2021 11:51:59 AM By: Kalman Shan DO Entered By: Kalman Shan on 03/14/2021 11:50:52

## 2021-03-18 ENCOUNTER — Ambulatory Visit (INDEPENDENT_AMBULATORY_CARE_PROVIDER_SITE_OTHER): Payer: Medicare Other | Admitting: Pharmacist

## 2021-03-18 DIAGNOSIS — I482 Chronic atrial fibrillation, unspecified: Secondary | ICD-10-CM

## 2021-03-18 DIAGNOSIS — E039 Hypothyroidism, unspecified: Secondary | ICD-10-CM

## 2021-03-18 DIAGNOSIS — I10 Essential (primary) hypertension: Secondary | ICD-10-CM

## 2021-03-18 DIAGNOSIS — Z7901 Long term (current) use of anticoagulants: Secondary | ICD-10-CM

## 2021-03-18 DIAGNOSIS — E782 Mixed hyperlipidemia: Secondary | ICD-10-CM | POA: Diagnosis not present

## 2021-03-18 NOTE — Progress Notes (Signed)
[name], [name] ([id number]) Visit Report for 03/14/2021 Arrival Information Details Patient Name: Date of Service: [name], [name] RD E. 03/14/2021 10:30 A M Medical Record Number: [id number] Patient Account Number: [id number] Date of Birth/Sex: Treating RN: 1935/12/22 (85 y.o. [name] Primary Care Ashari Llewellyn: [name] Other Clinician: Referring Hearl Heikes: Treating Champayne Kocian/Extender: [name] in Treatment: 6 Visit Information History Since Last Visit Added or deleted any medications: No Patient Arrived: [name] Any new allergies or adverse reactions: No Arrival Time: 11:29 Had a fall or experienced change in No Accompanied By: wife activities of daily living that may affect Transfer Assistance: None risk of falls: Patient Identification Verified: Yes Signs or symptoms of abuse/neglect since last visito No Secondary Verification Process Completed: Yes Hospitalized since last visit: No Patient Requires Transmission-Based Precautions: No Implantable device outside of the clinic excluding No Patient Has Alerts: Yes cellular tissue based products placed in the center Patient Alerts: Patient on Blood Thinner since last visit: Has Dressing in Place as Prescribed: Yes Pain Present Now: No Electronic Signature(s) Signed: 03/14/2021 12:34:28 PM By: Sandre Kitty Entered By: Sandre Kitty on 03/14/2021 11:29:56 -------------------------------------------------------------------------------- Clinic Level of Care Assessment Details Patient Name: Date of Service: [name], [name] RD E. 03/14/2021 10:30 A M Medical Record Number: [id number] Patient Account Number: [id number] Date of Birth/Sex: Treating RN: [birth year] (85 y.o. [name] Primary Care Deonte Otting: [name] Other Clinician: Referring Merin Borjon: Treating Jadwiga Faidley/Extender: [name]: 6 Clinic Level of Care Assessment Items TOOL 4 Quantity Score X- 1 0 Use  when only an EandM is performed on FOLLOW-UP visit ASSESSMENTS - Nursing Assessment / Reassessment X- 1 10 Reassessment of Co-morbidities (includes updates in patient status) X- 1 5 Reassessment of Adherence to Treatment Plan ASSESSMENTS - Wound and Skin A ssessment / Reassessment X - Simple Wound Assessment / Reassessment - one wound 1 5 []  - 0 Complex Wound Assessment / Reassessment - multiple wounds []  - 0 Dermatologic / Skin Assessment (not related to wound area) ASSESSMENTS - Focused Assessment []  - 0 Circumferential Edema Measurements - multi extremities []  - 0 Nutritional Assessment / Counseling / Intervention X- 1 5 Lower Extremity Assessment (monofilament, tuning fork, pulses) []  - 0 Peripheral Arterial Disease Assessment (using hand held doppler) ASSESSMENTS - Ostomy and/or Continence Assessment and Care []  - 0 Incontinence Assessment and Management []  - 0 Ostomy Care Assessment and Management (repouching, etc.) PROCESS - Coordination of Care X - Simple Patient / Family Education for ongoing care 1 15 []  - 0 Complex (extensive) Patient / Family Education for ongoing care X- 1 10 Staff obtains Programmer, systems, Records, T Results / Process Orders est []  - 0 Staff telephones HHA, Nursing Homes / Clarify orders / etc []  - 0 Routine Transfer to another Facility (non-emergent condition) []  - 0 Routine Hospital Admission (non-emergent condition) []  - 0 New Admissions / Biomedical engineer / Ordering NPWT Apligraf, etc. , []  - 0 Emergency Hospital Admission (emergent condition) X- 1 10 Simple Discharge Coordination []  - 0 Complex (extensive) Discharge Coordination PROCESS - Special Needs []  - 0 Pediatric / Minor Patient Management []  - 0 Isolation Patient Management []  - 0 Hearing / Language / Visual special needs []  - 0 Assessment of Community assistance (transportation, D/C planning, etc.) []  - 0 Additional assistance / Altered mentation []  - 0 Support  Surface(s) Assessment (bed, cushion, seat, etc.) INTERVENTIONS - Wound Cleansing / Measurement X - Simple Wound Cleansing - one wound 1 5 []  -  0 Complex Wound Cleansing - multiple wounds X- 1 5 Wound Imaging (photographs - any number of wounds) []  - 0 Wound Tracing (instead of photographs) X- 1 5 Simple Wound Measurement - one wound []  - 0 Complex Wound Measurement - multiple wounds INTERVENTIONS - Wound Dressings []  - 0 Small Wound Dressing one or multiple wounds []  - 0 Medium Wound Dressing one or multiple wounds []  - 0 Large Wound Dressing one or multiple wounds []  - 0 Application of Medications - topical []  - 0 Application of Medications - injection INTERVENTIONS - Miscellaneous []  - 0 External ear exam []  - 0 Specimen Collection (cultures, biopsies, blood, body fluids, etc.) []  - 0 Specimen(s) / Culture(s) sent or taken to Lab for analysis []  - 0 Patient Transfer (multiple staff / Civil Service fast streamer / Similar devices) []  - 0 Simple Staple / Suture removal (25 or less) []  - 0 Complex Staple / Suture removal (26 or more) []  - 0 Hypo / Hyperglycemic Management (close monitor of Blood Glucose) []  - 0 Ankle / Brachial Index (ABI) - do not check if billed separately X- 1 5 Vital Signs Has the patient been seen at the hospital within the last three years: Yes Total Score: 80 Level Of Care: New/Established - Level 3 Electronic Signature(s) Signed: 03/18/2021 5:39:04 PM By: Levan Hurst RN, BSN Entered By: Levan Hurst on 03/14/2021 11:40:42 -------------------------------------------------------------------------------- Encounter Discharge Information Details Patient Name: Date of Service: [name], [name] RD E. 03/14/2021 10:30 A M Medical Record Number: [id number] Patient Account Number: [id number] Date of Birth/Sex: Treating RN: [birth year] (85 y.o. [name] Primary Care Fatin Bachicha: [name] Other Clinician: Referring Ikey Omary: Treating Xylon Croom/Extender: [name]: 6 Encounter Discharge Information Items Discharge Condition: Stable Ambulatory Status: Ambulatory Discharge Destination: Home Transportation: Private Auto Accompanied By: wife Schedule Follow-up Appointment: Yes Clinical Summary of Care: Patient Declined Electronic Signature(s) Signed: 03/18/2021 5:39:04 PM By: Levan Hurst RN, BSN Entered By: Levan Hurst on 03/14/2021 11:41:18 -------------------------------------------------------------------------------- Lower Extremity Assessment Details Patient Name: Date of Service: [name], [name] RD E. 03/14/2021 10:30 A M Medical Record Number: [id number] Patient Account Number: [id number] Date of Birth/Sex: Treating RN: [birth year] (85 y.o. [name] Primary Care Dez Stauffer: [name] Other Clinician: Referring Deaven Barron: Treating Spruha Weight/Extender: [name]: 6 Edema Assessment Assessed: [Left: No] [Right: No] Edema: [Left: N] [Right: o] Calf Left: Right: Point of Measurement: 31 cm From Medial Instep 40 cm Ankle Left: Right: Point of Measurement: 9 cm From Medial Instep 23 cm Vascular Assessment Pulses: Dorsalis Pedis Palpable: [Right:Yes] Electronic Signature(s) Signed: 03/18/2021 5:39:04 PM By: Levan Hurst RN, BSN Entered By: Levan Hurst on 03/14/2021 11:34:01 -------------------------------------------------------------------------------- Multi Wound Chart Details Patient Name: Date of Service: [name], [name] RD E. 03/14/2021 10:30 A M Medical Record Number: [id number] Patient Account Number: [id number] Date of Birth/Sex: Treating RN: [birth year] (85 y.o. [name] Primary Care Lene Mckay: [name] Other Clinician: Referring Maeci Kalbfleisch: Treating Teshawn Moan/Extender: [name]: 6 Vital Signs Height(in): Pulse(bpm): 59 Weight(lbs): Blood Pressure(mmHg): 148/71 Body Mass  Index(BMI): Temperature(F): 98.5 Respiratory Rate(breaths/min): 16 Photos: [1:No Photos Right, Medial Lower Leg] [N/A:N/A N/A] Wound Location: [1:Gradually Appeared] [N/A:N/A] Wounding Event: [1:Venous Leg Ulcer] [N/A:N/A] Primary Etiology: [1:Glaucoma, Coronary Artery Disease,] [N/A:N/A] Comorbid History: [1:Hypertension, Peripheral Venous Disease, Osteoarthritis 07/02/2020] [N/A:N/A] Date Acquired: [1:6] [N/A:N/A] Weeks of Treatment: [1:Healed - Epithelialized] [N/A:N/A] Wound Status: [1:0x0x0] [N/A:N/A] Measurements L x W x D (cm) [1:0] [N/A:N/A] A (cm) : rea [  1:0] [N/A:N/A] Volume (cm) : [1:100.00%] [N/A:N/A] % Reduction in Area: [1:100.00%] [N/A:N/A] % Reduction in Volume: [1:Full Thickness Without Exposed] [N/A:N/A] Classification: [1:Support Structures None Present] [N/A:N/A] Exudate Amount: [1:Distinct, outline attached] [N/A:N/A] Wound Margin: [1:None Present (0%)] [N/A:N/A] Granulation Amount: [1:None Present (0%)] [N/A:N/A] Necrotic Amount: [1:Fascia: No] [N/A:N/A] Exposed Structures: [1:Fat Layer (Subcutaneous Tissue): No Tendon: No Muscle: No Joint: No Bone: No Large (67-100%)] [N/A:N/A] Treatment Notes Electronic Signature(s) Signed: 03/14/2021 11:51:59 AM By: Kalman Shan DO Signed: 03/14/2021 6:17:32 PM By: Baruch Gouty RN, BSN Entered By: Kalman Shan on 03/14/2021 11:45:39 -------------------------------------------------------------------------------- Multi-Disciplinary Care Plan Details Patient Name: Date of Service: [name], [name] RD E. 03/14/2021 10:30 A M Medical Record Number: [id number] Patient Account Number: [id number] Date of Birth/Sex: Treating RN: [birth year] (85 y.o. [name] Primary Care Aaima Gaddie: [name] Other Clinician: Referring Hannibal Skalla: Treating Kenn Rekowski/Extender: [name]: 6 Active Inactive Electronic Signature(s) Signed: 03/18/2021 5:39:04 PM By: Levan Hurst RN, BSN Entered  By: Levan Hurst on 03/14/2021 11:36:22 -------------------------------------------------------------------------------- Pain Assessment Details Patient Name: Date of Service: [name], [name] RD E. 03/14/2021 10:30 A M Medical Record Number: [id number] Patient Account Number: [id number] Date of Birth/Sex: Treating RN: [birth year] (85 y.o. [name] Primary Care Chinonso Linker: [name] Other Clinician: Referring Nadia Torr: Treating Donielle Radziewicz/Extender: [name]: 6 Active Problems Location of Pain Severity and Description of Pain Patient Has Paino No Site Locations Pain Management and Medication Current Pain Management: Electronic Signature(s) Signed: 03/14/2021 12:34:28 PM By: Sandre Kitty Signed: 03/14/2021 6:17:32 PM By: Baruch Gouty RN, BSN Entered By: Sandre Kitty on 03/14/2021 11:30:17 -------------------------------------------------------------------------------- Patient/Caregiver Education Details Patient Name: Date of Service: [name], [name] RD E. 6/24/2022andnbsp10:30 [place] Record Number: [id number] Patient Account Number: [id number] Date of Birth/Gender: Treating RN: 11-01-1935 (85 y.o. [name] Primary Care Physician: [name] Other Clinician: Referring Physician: Treating Physician/Extender: [name] in Treatment: 6 Education Assessment Education Provided To: Patient Education Topics Provided Wound/Skin Impairment: Methods: Explain/Verbal Responses: State content correctly Electronic Signature(s) Signed: 03/18/2021 5:39:04 PM By: Levan Hurst RN, BSN Entered By: Levan Hurst on 03/14/2021 11:36:37 -------------------------------------------------------------------------------- Wound Assessment Details Patient Name: Date of Service: [name], [name] RD E. 03/14/2021 10:30 A M Medical Record Number: [id number] Patient Account Number: [id number] Date of Birth/Sex: Treating  RN: [birth year] (85 y.o. [name] Primary Care Prapti Grussing: [name] Other Clinician: Referring Karianna Gusman: Treating Consandra Laske/Extender: [name]: 6 Wound Status Wound Number: 1 Primary Venous Leg Ulcer Etiology: Wound Location: Right, Medial Lower Leg Wound Healed - Epithelialized Wounding Event: Gradually Appeared Status: Date Acquired: 07/02/2020 Comorbid Glaucoma, Coronary Artery Disease, Hypertension, Peripheral Weeks Of Treatment: 6 History: Venous Disease, Osteoarthritis Clustered Wound: No Wound Measurements Length: (cm) Width: (cm) Depth: (cm) Area: (cm) Volume: (cm) 0 % Reduction in Area: 100% 0 % Reduction in Volume: 100% 0 Epithelialization: Large (67-100%) 0 Tunneling: No 0 Undermining: No Wound Description Classification: Full Thickness Without Exposed Support Structures Wound Margin: Distinct, outline attached Exudate Amount: None Present Foul Odor After Cleansing: No Slough/Fibrino No Wound Bed Granulation Amount: None Present (0%) Exposed Structure Necrotic Amount: None Present (0%) Fascia Exposed: No Fat Layer (Subcutaneous Tissue) Exposed: No Tendon Exposed: No Muscle Exposed: No Joint Exposed: No Bone Exposed: No Electronic Signature(s) Signed: 03/14/2021 6:17:32 PM By: Baruch Gouty RN, BSN Signed: 03/18/2021 5:39:04 PM By: Levan Hurst RN, BSN Entered By: Levan Hurst on 03/14/2021 11:34:34 -------------------------------------------------------------------------------- Vitals Details Patient Name: Date of  Service: [name], [name] RD E. 03/14/2021 10:30 A M Medical Record Number: [id number] Patient Account Number: [id number] Date of Birth/Sex: Treating RN: May 20, 1936 (85 y.o. [name] Primary Care Dekker Verga: [name] Other Clinician: Referring Lailani Tool: Treating Meleny Tregoning/Extender: [name]: 6 Vital Signs Time Taken: 11:29 Temperature (F):  98.5 Pulse (bpm): 59 Respiratory Rate (breaths/min): 16 Blood Pressure (mmHg): 148/71 Reference Range: 80 - 120 mg / dl Electronic Signature(s) Signed: 03/14/2021 12:34:28 PM By: Sandre Kitty Entered By: Sandre Kitty on 03/14/2021 11:30:13

## 2021-03-20 NOTE — Patient Instructions (Signed)
Visit Information  PATIENT GOALS:  Goals Addressed             This Visit's Progress    Chronic Care Management Pharmacy Care Plan   Not on track    [place] (see longitudinal plan of care for additional care plan information)  Current Barriers:  Chronic Disease Management support, education, and care coordination needs related to Hypertension, Hyperlipidemia/CAD, Pre-Diabetes AFib, Hypothyroidism, Edema   Hypertension BP Readings from Last 3 Encounters:  02/24/21 136/80  01/17/21 (!) 155/93  11/18/20 (!) 142/64  Pharmacist Clinical Goal(s): Over the next 180 days, patient will work with PharmD and providers to maintain BP goal <140/90 Current regimen:  Benazepril 20mg  1.5 tablets daily y Diltiazem XR 180mg  daily Metoprolol Tartrate 1/2 tab twice daily Interventions: Discussed BP goal Patient self care activities - Over the next 90 days, patient will: Maintain hypertension medication regimen.  Check blood pressure 1 to 2 times per week and record for future visits  Hyperlipidemia Lab Results  Component Value Date/Time   LDLCALC 82 12/12/2020 10:44 AM   LDLCALC 96 10/31/2014 08:24 AM  Pharmacist Clinical Goal(s): Over the next 180 days, patient will work with PharmD and providers to achieve LDL goal < 70 Current regimen:  Atorvastatin 40mg  1.5 tablets daily  Aspirin 81mg  daily Nitroglycerin 0.4mg  as needed Interventions: Discussed LDL goal Patient self care activities - Over the next 180 days, patient will: Maintain cholesterol medication regimen.   Pre-Diabetes Lab Results  Component Value Date/Time   HGBA1C 5.9 10/25/2020 10:55 AM   HGBA1C 6.2 10/11/2019 12:03 PM  Pharmacist Clinical Goal(s): Over the next 180 days, patient will work with PharmD and providers to maintain A1c goal <6.5% Current regimen:  Diet and exercise management   Patient self care activities - Over the next 180 days, patient will: Maintain a1c <6.5%  Hypothyroidism (Goal:  [place] TSH WNL) Current treatment  Levothyroxine 139mcg daily Levothyroxine 37mcg daily Patient self care activities - Over the next 180 days, patient will: Take levothyroxine 30 minutes before meals or other meds on empty stomach Also reminded to avoid taking multivitamins, iron or calcium containing products within 4 hours of levothyroxine.  Medication management Pharmacist Clinical Goal(s): Over the next 180 days, patient will work with PharmD and providers to maintain optimal medication adherence Current pharmacy: [facility] Interventions Comprehensive medication review performed. Continue current medication management strategy Patient self care activities - Over the next 180 days, patient will: Focus on medication adherence by filling and taking medications appropriately  Take medications as prescribed Report any questions or concerns to PharmD and/or provider(s)  Please see past updates related to this goal by clicking on the "Past Updates" button in the selected goal           Patient verbalizes understanding of instructions provided today and agrees to view in [place].   Telephone follow up appointment with care management team member scheduled for: 6 months  Cherre Robins, PharmD Clinical Pharmacist [facility] [place] [phone]

## 2021-03-20 NOTE — Chronic Care Management (AMB) (Signed)
Chronic Care Management Pharmacy Note  03/26/2021 Name:  [name] MRN:  [id number] DOB:  1935/12/19  Summary: Patient is unsure about dose of atorvastatin - he is taking 60mg  daily   Recommendations/Changes made from today's visit: Coordinating with PCP to verify if he wants patient to continue atorvastatin 60mg  daily or if he should take 40mg  daily   Subjective: [name] is an 85 y.o. year old male who is a primary patient of Colon Branch, MD.  The CCM team was consulted for assistance with disease management and care coordination needs.    Engaged with patient by telephone for follow up visit in response to provider referral for pharmacy case management and/or care coordination services.   Consent to Services:  The patient was given information about Chronic Care Management services, agreed to services, and gave verbal consent prior to initiation of services.  Please see initial visit note for detailed documentation.   Patient Care Team: Colon Branch, MD as PCP - General (Internal Medicine) Josue Hector, MD as Consulting Physician (Cardiology) Allyn Kenner, MD (Dermatology) Gaynelle Arabian, MD as Consulting Physician (Orthopedic Surgery) Newman Pies, MD as Consulting Physician (Neurosurgery) Marygrace Drought, MD as Consulting Physician (Ophthalmology) Cherre Robins, PharmD (Pharmacist)  Recent office visits: 02/24/2021 - PCP (Dr Larose Kells) F/U chronic conditions. TSH was noted to be elevated - no med change but to take both levothyroxine 171mcg and 15mcg daily in morning and recheck in 1 month.   Recent consult visits: 03/16/2021 - Wound Care (Dr Heber [place]) f/u wound on right lower extremity. Completed therapy. wound has healed.  03/07/2021 - Wound Care - (Dr Dellia Nims) f/u wound on right lower extremity. Apply zinc oxide wiht dressing changes; also use moisturizing lotion AD; denatamicin under hydrofera primary dressing. Continue to use compression stocking 02/28/2021 -  Wound Care (Dr Heber Tennant) no med changes.  11/18/2020 - Cardio (Dr Johnsie Cancel) F/U CAD and PAF. No med changes.   Hospital visits: None in previous 6 months  Objective:  Lab Results  Component Value Date   CREATININE 1.07 02/24/2021   CREATININE 1.12 10/25/2020   CREATININE 1.09 06/06/2020    Lab Results  Component Value Date   HGBA1C 5.9 10/25/2020   Last diabetic Eye exam:  Lab Results  Component Value Date/Time   HMDIABEYEEXA No Retinopathy 07/10/2016 12:00 AM    Last diabetic Foot exam: No results found for: HMDIABFOOTEX      Component Value Date/Time   CHOL 148 12/12/2020 1044   CHOL 161 10/31/2014 0824   TRIG 86.0 12/12/2020 1044   TRIG 81 10/31/2014 0824   TRIG 86 07/23/2006 1012   HDL 48.70 12/12/2020 1044   HDL 49 10/31/2014 0824   CHOLHDL 3 12/12/2020 1044   VLDL 17.2 12/12/2020 1044   LDLCALC 82 12/12/2020 1044   LDLCALC 96 10/31/2014 0824    Hepatic Function Latest Ref Rng & Units 10/25/2020 06/06/2020 02/07/2019  Total Protein 6.0 - 8.3 g/dL 6.7 7.0 7.0  Albumin 3.5 - 5.2 g/dL 4.2 4.3 4.4  AST 0 - 37 U/L 21 26 24   ALT 0 - 53 U/L 17 20 19   Alk Phosphatase 39 - 117 U/L 68 74 68  Total Bilirubin 0.2 - 1.2 mg/dL 0.8 0.8 0.8  Bilirubin, Direct 0.0 - 0.3 mg/dL - - -    Lab Results  Component Value Date/Time   TSH 11.41 (H) 02/24/2021 11:31 AM   TSH 3.86 12/12/2020 10:44 AM    CBC Latest Ref Rng &  Units 02/24/2021 06/06/2020 02/07/2019  WBC 4.0 - 10.5 K/uL 7.3 6.9 5.8  Hemoglobin 13.0 - 17.0 g/dL 15.1 16.1 14.9  Hematocrit 39.0 - 52.0 % 44.6 48.5 44.3  Platelets 150.0 - 400.0 K/uL 255.0 202.0 213.0    No results found for: VD25OH  Clinical ASCVD: Yes  The ASCVD Risk score Mikey Bussing DC Jr., et al., 2013) failed to calculate for the following reasons:   The 2013 ASCVD risk score is only valid for ages 64 to 28    Other: CHADS2VASc = 4  Social History   Tobacco Use  Smoking Status Former   Pack years: 0.00   Types: Cigarettes   Quit date: 09/21/1984    Years since quitting: 36.5  Smokeless Tobacco Never  Tobacco Comments   smoked 1958-1986, up to 1 ppd   BP Readings from Last 3 Encounters:  02/24/21 136/80  01/17/21 (!) 155/93  11/18/20 (!) 142/64   Pulse Readings from Last 3 Encounters:  02/24/21 (!) 56  01/17/21 61  11/18/20 (!) 54   Wt Readings from Last 3 Encounters:  02/24/21 197 lb (89.4 kg)  01/17/21 197 lb 6.4 oz (89.5 kg)  11/18/20 194 lb 9.6 oz (88.3 kg)    Assessment: Review of patient past medical history, allergies, medications, health status, including review of consultants reports, laboratory and other test data, was performed as part of comprehensive evaluation and provision of chronic care management services.   SDOH:  (Social Determinants of Health) assessments and interventions performed:  SDOH Interventions    Flowsheet Row Most Recent Value  SDOH Interventions   Financial Strain Interventions Intervention Not Indicated  Transportation Interventions Intervention Not Indicated       CCM Care Plan  Allergies  Allergen Reactions   Amlodipine Besylate Other (See Comments)    REACTION: edema    Medications Reviewed Today     Reviewed by Cherre Robins, PharmD (Pharmacist) on 03/20/21 at 1516  Med List Status: <None>   Medication Order Taking? Sig Documenting Provider Last Dose Status Informant  aspirin 81 MG tablet [id number] Yes Take 1 tablet (81 mg total) by mouth daily.  Patient taking differently: Take 81 mg by mouth daily. bedtime   Josue Hector, MD Taking Active   atorvastatin (LIPITOR) 40 MG tablet [id number] Yes Take 1 tablet (40 mg total) by mouth at bedtime.  Patient taking differently: Take 60 mg by mouth at bedtime.   Colon Branch, MD Taking Active   benazepril (LOTENSIN) 20 MG tablet [id number] Yes Take 1.5 tablets (30 mg total) by mouth daily. Colon Branch, MD Taking Active   clobetasol cream (TEMOVATE) 0.05 % [id number]  Apply 1 application topically 2 (two) times daily as needed.  [provider]  Active   diltiazem (DILT-XR) 180 MG 24 hr capsule [id number] Yes Take 1 capsule (180 mg total) by mouth daily. Colon Branch, MD Taking Active   ELIQUIS 5 MG TABS tablet [id number] Yes TAKE 1 TABLET BY MOUTH TWICE DAILY Josue Hector, MD Taking Active   furosemide (LASIX) 40 MG tablet [id number] Yes TAKE 1 AND 1/2 TABLETS(60 MG) BY MOUTH TWICE DAILY Josue Hector, MD Taking Active   levothyroxine (SYNTHROID) 150 MCG tablet [id number] Yes TAKE 1 TABLET BY MOUTH EVERY DAY BEFORE BREAKFAST. ALONG WITH A 25MCG TABLET DAILY. Colon Branch, MD Taking Active   levothyroxine (SYNTHROID) 25 MCG tablet [id number] Yes TAKE 1 TABLET BY MOUTH DAILY BEFORE BREAKFAST ALONG WITH A 150MCG TABLET DAILY Colon Branch, MD  Taking Active   LUMIGAN 0.01 % SOLN [id number] Yes Place 1 drop into both eyes at bedtime. [provider] Taking Active Self           Med Note (CANTER, Vilma Prader D   Mon May 04, 2016  1:32 PM)    metoprolol tartrate (LOPRESSOR) 25 MG tablet [id number] Yes Take 0.5 tablets (12.5 mg total) by mouth 2 (two) times daily. Colon Branch, MD Taking Active   nitroGLYCERIN (NITROSTAT) 0.4 MG SL tablet [id number] No PLACE 1 TABLET UNDER THE TONGUE EVERY 5 MINUTES AS NEEDED FOR CHEST PAIN  Patient not taking: No sig reported   Josue Hector, MD Not Taking Active            Med Note (CANTER, Vilma Prader D   Mon Feb 24, 2021 11:00 AM) PRN  Timolol Maleate 0.5 % (DAILY) SOLN [id number] Yes Apply 1 drop to eye in the morning and at bedtime. [provider] Taking Active             Patient Active Problem List   Diagnosis Date Noted   Atrial fibrillation ([place]) 02/08/2019   Lumbar stenosis with neurogenic claudication 07/13/2016   Annual physical exam>>>>>>>>>>>>>>>>>> 05/04/2016   PCP NOTES>>>>>>>>>>>>>>>>>>>>>>>>>>> 12/31/2015   Edema 02/19/2015   OA (osteoarthritis) of knee 06/25/2014   Unspecified sleep apnea 06/18/2014   Multiple thyroid nodules    Tremor    Carotid  artery disease ([place])    Erectile dysfunction 05/31/2012   Coronary artery disease    Venous insufficiency    Hyperglycemia 11/09/2008   MELANOMA, HX OF 11/09/2008   COLONIC POLYPS, HYPERPLASTIC 05/10/2008   Hypothyroidism 08/10/2007   HYPERLIPIDEMIA 08/10/2007   Essential hypertension 08/10/2007   DJD (degenerative joint disease) 08/08/2007   MURMUR 08/08/2007   BENIGN PROSTATIC HYPERTROPHY, HX OF 08/05/2007    Immunization History  Administered Date(s) Administered   Fluad Quad(high Dose 65+) 08/04/2019, 10/25/2020   Influenza Split 08/12/2011, 09/06/2012   Influenza Whole 07/23/2006, 08/10/2007, 06/27/2008, 08/21/2009, 08/19/2010   Influenza, High Dose Seasonal PF 07/20/2013, 09/19/2015, 09/07/2018   Influenza,inj,Quad PF,6+ Mos 07/14/2016   Influenza-Unspecified 06/21/2014, 10/01/2017   PFIZER(Purple Top)SARS-COV-2 Vaccination 11/23/2019, 12/19/2019   Pneumococcal Conjugate-13 02/19/2015   Pneumococcal Polysaccharide-23 09/01/2013   Td 09/23/1998   Tdap 09/01/2013   Zoster, Live 09/29/2013    Conditions to be addressed/monitored: Atrial Fibrillation, CAD, HTN, HLD, and glaucoma; edema; h/o DVT; chronic venous insufficiency; hypothyroidism  Care Plan : General Pharmacy (Adult)  Updates made by Cherre Robins, PHARMD since 03/26/2021 12:00 AM     Problem: HTN, HLD, Hypothyroidism, Afib   Priority: High  Onset Date: 11/15/2020     Long-Range Goal: Patient-Specific Goal   Start Date: 11/15/2020  Recent Progress: On track  Priority: High  Note:   Current Barriers:  Unable to achieve control of TSH recently. Unsure of correct atorvastatin dose  Pharmacist Clinical Goal(s):  Over the next 120 days, patient will achieve adherence to monitoring guidelines and medication adherence to achieve therapeutic efficacy achieve control of TSH as evidenced by upcoming lab adhere to prescribed medication regimen as evidenced by fill dates contact provider office for  questions/concerns as evidenced notation of same in electronic health record through collaboration with PharmD and provider.   Interventions: 1:1 collaboration with Colon Branch, MD regarding development and update of comprehensive plan of care as evidenced by provider attestation and co-signature Inter-disciplinary care team collaboration (see longitudinal plan of care) Comprehensive medication review performed; medication list updated in electronic medical  record  Hypertension (BP goal <140/90) Controlled Current treatment: Benazepril 20mg  1.5 tablets daily Diltiazem XR 180mg  daily Metoprolol Tartrate 0.5 tablet twice daily Medications previously tried: spironolactone Current home readings: 112/80; 120/78; 114/75 Denies hypotensive/hypertensive symptoms Interventions:  Educated on BP goals and benefits of medications for prevention of heart attack, stroke and kidney damage; Continue to monitor BP 1 to 2 times per week, document, and provide log at future appointments Recommended to continue current medication  Hyperlipidemia/CAD: (LDL goal < 70) Not at LDL goal - last LDL was 91 (10/25/2020) Current treatment: Atorvastatin 40mg  takeing 1.5 tablets = 60mg  daily but med list has been changed back to 40mg  daily on 02/04/2021 Aspirin 81mg  daily Nitroglycerin 0.4mg  as needed Medications previously tried: none noted  Interventions:  Coordinated with PCP to verify dose of atorvastatin - Dr Larose Kells has recommeneded continue 60mg  daily  Recommend recheck lipids 4 to 6 weeks after on stable dose Reviewed cholesterol goals  Pre-Diabetes (A1c goal <6.5%) Controlled Current medications: None Medications previously tried: none noted  Intervention: (none today addressed at previous visits)  Educated onA1c and blood sugar goals; Recommended he continue current lifestyle managment  Atrial Fibrillation (Goal: prevent stroke and major bleeding) Controlled  CHADSVASc = 4 Current treatment: Rate  control:  Diltiazem XR 180mg  daily Metoprolol Tartrate 1/2 tab twice daily Anticoagulation:  Eliquis 5mg  bid Medications previously tried: none noted  Interventions:  Counseled on importance of adherence to anticoagulant as prescribed Screened for Eliquis patient assistance but household income did not qualify Recommended to continue current medication  Hypothyroidism (Goal: Maintain TSH WNL) Last TSH was WNL on 12/12/2020 - was 3.86 Improved from 6.8 on 10/25/20 Current treatment  Levothyroxine 153mcg daily Levothyroxine 45mcg daily Medications previously tried: none noted Interventions:  Educated on proper timing of medication 30 minutes before meals or other meds on empty stomach Also reminded to avoid taking multivitamin or calcium containing products within 4 hours of levothyroxine.   Patient Goals/Self-Care Activities Over the next 120 days, patient will:  take medications as prescribed check blood pressure 1 to 2 times per week, document, and provide at future appointments Report to PCP for updated labs as directed  Follow Up Plan: The care management team will reach out to the patient again over the next 120 days.        Medication Assistance:  screened for PCP for Eliquis but HH income did not qualify.  Patient's preferred pharmacy is:  [id number] [address], [place] [address] [place] [address] Phone: [phone] Fax: [phone]   Follow Up:  Patient agrees to Care Plan and Follow-up.  Plan: Telephone follow up appointment with care management team member scheduled for:  6 months  Cherre Robins, PharmD Clinical Pharmacist [facility] [place] [phone]

## 2021-03-26 ENCOUNTER — Telehealth: Payer: Self-pay

## 2021-03-26 DIAGNOSIS — E039 Hypothyroidism, unspecified: Secondary | ICD-10-CM | POA: Diagnosis not present

## 2021-03-26 DIAGNOSIS — R531 Weakness: Secondary | ICD-10-CM | POA: Diagnosis not present

## 2021-03-26 DIAGNOSIS — Z7901 Long term (current) use of anticoagulants: Secondary | ICD-10-CM | POA: Diagnosis not present

## 2021-03-26 DIAGNOSIS — Z96659 Presence of unspecified artificial knee joint: Secondary | ICD-10-CM | POA: Diagnosis not present

## 2021-03-26 DIAGNOSIS — I2510 Atherosclerotic heart disease of native coronary artery without angina pectoris: Secondary | ICD-10-CM | POA: Diagnosis not present

## 2021-03-26 DIAGNOSIS — Z87891 Personal history of nicotine dependence: Secondary | ICD-10-CM | POA: Diagnosis not present

## 2021-03-26 DIAGNOSIS — E785 Hyperlipidemia, unspecified: Secondary | ICD-10-CM | POA: Diagnosis not present

## 2021-03-26 DIAGNOSIS — U071 COVID-19: Secondary | ICD-10-CM | POA: Diagnosis not present

## 2021-03-26 DIAGNOSIS — I4891 Unspecified atrial fibrillation: Secondary | ICD-10-CM | POA: Diagnosis not present

## 2021-03-26 DIAGNOSIS — I480 Paroxysmal atrial fibrillation: Secondary | ICD-10-CM | POA: Diagnosis not present

## 2021-03-26 DIAGNOSIS — R0789 Other chest pain: Secondary | ICD-10-CM | POA: Diagnosis not present

## 2021-03-26 DIAGNOSIS — E876 Hypokalemia: Secondary | ICD-10-CM | POA: Diagnosis not present

## 2021-03-26 DIAGNOSIS — R059 Cough, unspecified: Secondary | ICD-10-CM | POA: Diagnosis not present

## 2021-03-26 DIAGNOSIS — I10 Essential (primary) hypertension: Secondary | ICD-10-CM | POA: Diagnosis not present

## 2021-03-26 DIAGNOSIS — R627 Adult failure to thrive: Secondary | ICD-10-CM | POA: Diagnosis not present

## 2021-03-26 DIAGNOSIS — Z955 Presence of coronary angioplasty implant and graft: Secondary | ICD-10-CM | POA: Diagnosis not present

## 2021-03-26 NOTE — Telephone Encounter (Signed)
Spoke w/ Pt's daughterTye [name], whom is a Marine scientist, she and her parents are in [place], she reports that Pt has been weaker in his legs the last several days and fell this morning getting up out of bed, she is worried that he may have had a mini stroke. Recommended she take him to ED locally. Cathy verbalized understanding.

## 2021-03-27 LAB — BASIC METABOLIC PANEL
BUN: 22 — AB (ref 4–21)
CO2: 29 — AB (ref 13–22)
Chloride: 101 (ref 99–108)
Creatinine: 0.9 (ref 0.6–1.3)
Glucose: 105
Potassium: 3.5 (ref 3.4–5.3)
Sodium: 139 (ref 137–147)

## 2021-03-27 LAB — CBC AND DIFFERENTIAL
HCT: 45 (ref 41–53)
Hemoglobin: 15.5 (ref 13.5–17.5)
Platelets: 170 (ref 150–399)
WBC: 6.1

## 2021-03-27 LAB — COMPREHENSIVE METABOLIC PANEL
Calcium: 9 (ref 8.7–10.7)
GFR calc non Af Amer: 85

## 2021-03-27 LAB — HEPATIC FUNCTION PANEL
ALT: 30 (ref 10–40)
AST: 55 — AB (ref 14–40)
Alkaline Phosphatase: 86 (ref 25–125)
Bilirubin, Total: 86

## 2021-03-27 LAB — POCT INR: INR: 2 — AB (ref 0.9–1.1)

## 2021-03-27 LAB — VITAMIN D 25 HYDROXY (VIT D DEFICIENCY, FRACTURES): Vit D, 25-Hydroxy: 31.5

## 2021-03-27 LAB — CBC: RBC: 4.96 (ref 3.87–5.11)

## 2021-03-27 LAB — VITAMIN B12: Vitamin B-12: 409

## 2021-03-27 LAB — TSH: TSH: 4.22 (ref 0.41–5.90)

## 2021-03-31 ENCOUNTER — Inpatient Hospital Stay: Payer: Medicare Other | Admitting: Internal Medicine

## 2021-04-07 ENCOUNTER — Ambulatory Visit (INDEPENDENT_AMBULATORY_CARE_PROVIDER_SITE_OTHER): Payer: Medicare Other | Admitting: Internal Medicine

## 2021-04-07 ENCOUNTER — Other Ambulatory Visit: Payer: Self-pay

## 2021-04-07 VITALS — BP 126/67 | HR 56 | Temp 97.9°F | Resp 18 | Wt 195.6 lb

## 2021-04-07 DIAGNOSIS — E039 Hypothyroidism, unspecified: Secondary | ICD-10-CM

## 2021-04-07 DIAGNOSIS — U071 COVID-19: Secondary | ICD-10-CM | POA: Diagnosis not present

## 2021-04-07 LAB — CBC WITH DIFFERENTIAL/PLATELET
Basophils Absolute: 0.1 10*3/uL (ref 0.0–0.1)
Basophils Relative: 1.1 % (ref 0.0–3.0)
Eosinophils Absolute: 0.5 10*3/uL (ref 0.0–0.7)
Eosinophils Relative: 6.7 % — ABNORMAL HIGH (ref 0.0–5.0)
HCT: 43.7 % (ref 39.0–52.0)
Hemoglobin: 14.6 g/dL (ref 13.0–17.0)
Lymphocytes Relative: 16.6 % (ref 12.0–46.0)
Lymphs Abs: 1.2 10*3/uL (ref 0.7–4.0)
MCHC: 33.4 g/dL (ref 30.0–36.0)
MCV: 91.5 fl (ref 78.0–100.0)
Monocytes Absolute: 1 10*3/uL (ref 0.1–1.0)
Monocytes Relative: 14.5 % — ABNORMAL HIGH (ref 3.0–12.0)
Neutro Abs: 4.4 10*3/uL (ref 1.4–7.7)
Neutrophils Relative %: 61.1 % (ref 43.0–77.0)
Platelets: 317 10*3/uL (ref 150.0–400.0)
RBC: 4.78 Mil/uL (ref 4.22–5.81)
RDW: 15 % (ref 11.5–15.5)
WBC: 7.1 10*3/uL (ref 4.0–10.5)

## 2021-04-07 LAB — COMPREHENSIVE METABOLIC PANEL
ALT: 19 U/L (ref 0–53)
AST: 22 U/L (ref 0–37)
Albumin: 3.9 g/dL (ref 3.5–5.2)
Alkaline Phosphatase: 71 U/L (ref 39–117)
BUN: 15 mg/dL (ref 6–23)
CO2: 26 mEq/L (ref 19–32)
Calcium: 9.4 mg/dL (ref 8.4–10.5)
Chloride: 105 mEq/L (ref 96–112)
Creatinine, Ser: 0.98 mg/dL (ref 0.40–1.50)
GFR: 71.54 mL/min (ref >60.00)
Glucose, Bld: 93 mg/dL (ref 70–99)
Potassium: 4.2 mEq/L (ref 3.5–5.1)
Sodium: 140 mEq/L (ref 135–145)
Total Bilirubin: 0.7 mg/dL (ref 0.2–1.2)
Total Protein: 6.3 g/dL (ref 6.0–8.3)

## 2021-04-07 NOTE — Progress Notes (Signed)
Subjective:    Patient ID: [name], male    DOB: [birth year], 85 y.o.   MRN: [id number]  DOS:  04/07/2021 Type of visit - description: Hospital follow-up  Patient took a vacation w/ his family, went to beach house, many people got sick.  The patient got extremely weak, developed a headache, went to the hospital, Dx with COVID, was admitted for 1 day and released on 03/27/2021. He also had a mild cough which persist today but has improved. Apparently he did not get any COVID treatment. Records reviewed: Chest x-ray no acute, EKG unremarkable. BNP elevated. AST ALT also slightly elevated. TSH 4.22   Review of Systems He did not have fever chills No chest pain no difficulty breathing No nausea or vomiting. No mental status changes.   Past Medical History:  Diagnosis Date   Arthritis    BPH (benign prostatic hypertrophy)    Carotid artery disease (HCC)    Doppler, December 08, 2011, 00 40% R. ICA, 98-11% LICA, followup 1 year   Coronary artery disease    DES Circumflex or CVA 2006  /  clear, February, 2011, EF 70%, no ischemia or   Ejection fraction    EF 60%, echo, 2009, mildly calcified aortic leaflets   History of colonic polyps    Hyperlipidemia    Hypertension    Hypothyroidism    Lumbar radiculopathy    Multiple thyroid nodules    Avascular echogenic areas noted in the right thyroid at the time of carotid Doppler.    PONV (postoperative nausea and vomiting)    "nausea with first hip surgery"   Skin cancer (melanoma) ([place])    PMH of    Spinal stenosis, lumbar    Tremor    Fine tremor right upper extremity   Venous insufficiency     Past Surgical History:  Procedure Laterality Date   COLONOSCOPY     CORONARY ANGIOPLASTY WITH STENT PLACEMENT  2006   x 2 stents; DES to CX and dRCA '06   KNEE SURGERY  1970's   "chipped bone"   LUMBAR LAMINECTOMY/DECOMPRESSION MICRODISCECTOMY Left 07/13/2016   Procedure: LUMBAR LAMINECTOMY AND FORAMINOTOMY Lumbar two,  three, Lumbar three-four, Lumbar four-five, LEFT Lumbar five-Sacral one DISECTOMY;  Surgeon: Newman Pies, MD;  Location: [facility];  Service: Neurosurgery;  Laterality: Left;   TOE SURGERY  1997   TONSILLECTOMY     TOTAL HIP ARTHROPLASTY Left 2007   TOTAL HIP ARTHROPLASTY Right 2010   TOTAL KNEE ARTHROPLASTY Right 06/25/2014   Procedure: RIGHT TOTAL KNEE ARTHROPLASTY;  Surgeon: Gearlean Alf, MD;  Location: [facility];  Service: Orthopedics;  Laterality: Right;    Allergies as of 04/07/2021       Reactions   Amlodipine Besylate Other (See Comments)   REACTION: edema        Medication List        Accurate as of April 07, 2021 11:48 AM. If you have any questions, ask your nurse or doctor.          aspirin 81 MG tablet Take 1 tablet (81 mg total) by mouth daily. What changed: additional instructions   atorvastatin 40 MG tablet Commonly known as: LIPITOR Take 1 tablet (40 mg total) by mouth at bedtime. What changed: how much to take   benazepril 20 MG tablet Commonly known as: LOTENSIN Take 1.5 tablets (30 mg total) by mouth daily.   clobetasol cream 0.05 % Commonly known as: TEMOVATE Apply 1  application topically 2 (two) times daily as needed.   diltiazem 180 MG 24 hr capsule Commonly known as: Dilt-XR Take 1 capsule (180 mg total) by mouth daily.   Eliquis 5 MG Tabs tablet Generic drug: apixaban TAKE 1 TABLET BY MOUTH TWICE DAILY   furosemide 40 MG tablet Commonly known as: LASIX TAKE 1 AND 1/2 TABLETS(60 MG) BY MOUTH TWICE DAILY   levothyroxine 150 MCG tablet Commonly known as: SYNTHROID TAKE 1 TABLET BY MOUTH EVERY DAY BEFORE BREAKFAST. ALONG WITH A 25MCG TABLET DAILY.   levothyroxine 25 MCG tablet Commonly known as: SYNTHROID TAKE 1 TABLET BY MOUTH DAILY BEFORE BREAKFAST ALONG WITH A 150MCG TABLET DAILY   Lumigan 0.01 % Soln Generic drug: bimatoprost Place 1 drop into both eyes at bedtime.   metoprolol tartrate 25 MG tablet Commonly known as:  LOPRESSOR Take 0.5 tablets (12.5 mg total) by mouth 2 (two) times daily.   nitroGLYCERIN 0.4 MG SL tablet Commonly known as: NITROSTAT PLACE 1 TABLET UNDER THE TONGUE EVERY 5 MINUTES AS NEEDED FOR CHEST PAIN   Timolol Maleate (Once-Daily) 0.5 % Soln Apply 1 drop to eye in the morning and at bedtime.           Objective:   Physical Exam BP 126/67 (BP Location: Left Arm, Patient Position: Sitting, Cuff Size: Normal)   Pulse (!) 56   Temp 97.9 F (36.6 C) (Oral)   Resp 18   Wt 195 lb 9.6 oz (88.7 kg)   SpO2 97%   BMI 28.89 kg/m  General:   Well developed, NAD, BMI noted. HEENT:  Normocephalic . Face symmetric, atraumatic Lungs:  CTA B Normal respiratory effort, no intercostal retractions, no accessory muscle use. Heart: Bradycardic.  Lower extremities: Wrapped bilaterally due to chronic edema Skin: Not pale. Not jaundice Neurologic:  alert & oriented X3.  Speech normal, gait: Assisted by a cane Psych--  Cognition and judgment appear intact.  Cooperative with normal attention span and concentration.  Behavior appropriate. No anxious or depressed appearing.      Assessment      Assessment  (Transfer from  Dr. Linna Darner [phone]) Hyperglycemia, A1c 6.2 2013 HTN Hyperlipidemia Hypothyroidism  Thyroid US 2014-- no nodules, inhomogeneous  CV: --CAD: stents 2006;  low risk nuclear scan 2011, 03-2016 --A Fib dx 09/2018 at regular crads OV, on Eliquis, EF --Carotid artery disease [place] 12/2014:   Stable, 1-39% RICA stenosis. Stable, 92-11% LICA stenosis. [place] 12/2015: Next 2 years R leg edema, [place] (-)   DVT 08-2014 and 01/2019 [facility] DJD: Dr Maureen Ralphs, back surgery Dr Arnoldo Morale 06-2016 Venous insufficiency, chronic R>L edema Tremor Glaucoma H/o skin cancer , denies Melanoma; sees derm, had a visit ~ 09/2017, Dr Nevada Crane  PLAN: COVID-19: Dx with COVID-19 on 03/27/2021, was admitted to an outside hospital, fortunately he had a mild case, reportedly no treatment was provided, he is doing  better, essentially at his baseline except for a mild cough. Vital signs today very good, O2 sat 97%. Plan: CMP, CBC, rec to proceed with COVID-vaccine #3 in 1 month. Hypothyroidism: Last TSH on 02/24/2021 was 11.41, was recommended to take levothyroxine 150 mg and levothyroxine 25 mg both in the morning.  TSH during recent hospital admission 4.2, improving. RTC as scheduled for November    This visit occurred during the SARS-CoV-2 public health emergency.  Safety protocols were in place, including screening questions prior to the visit, additional usage of staff PPE, and extensive cleaning of exam room while observing appropriate contact time as indicated  for disinfecting solutions.

## 2021-04-07 NOTE — Patient Instructions (Addendum)
Proceed with a COVID vaccination in 1 month  If you are not completely back to normal in 2 to 3 weeks let us know  See your November

## 2021-04-08 NOTE — Assessment & Plan Note (Signed)
COVID-19: Dx with COVID-19 on 03/27/2021, was admitted to an outside hospital, fortunately he had a mild case, reportedly no treatment was provided, he is doing better, essentially at his baseline except for a mild cough. Vital signs today very good, O2 sat 97%. Plan: CMP, CBC, rec to proceed with COVID-vaccine #3 in 1 month. Hypothyroidism: Last TSH on 02/24/2021 was 11.41, was recommended to take levothyroxine 150 mg and levothyroxine 25 mg both in the morning.  TSH during recent hospital admission 4.2, improving. RTC as scheduled for November

## 2021-04-16 ENCOUNTER — Other Ambulatory Visit: Payer: Self-pay | Admitting: Internal Medicine

## 2021-04-17 MED ORDER — LEVOTHYROXINE SODIUM 25 MCG PO TABS: ORAL_TABLET | ORAL | 1 refills | Status: DC

## 2021-04-22 ENCOUNTER — Encounter: Payer: Self-pay | Admitting: Internal Medicine

## 2021-04-24 DIAGNOSIS — D485 Neoplasm of uncertain behavior of skin: Secondary | ICD-10-CM | POA: Diagnosis not present

## 2021-04-24 DIAGNOSIS — C44519 Basal cell carcinoma of skin of other part of trunk: Secondary | ICD-10-CM | POA: Diagnosis not present

## 2021-04-24 DIAGNOSIS — L814 Other melanin hyperpigmentation: Secondary | ICD-10-CM | POA: Diagnosis not present

## 2021-04-24 DIAGNOSIS — I8312 Varicose veins of left lower extremity with inflammation: Secondary | ICD-10-CM | POA: Diagnosis not present

## 2021-04-24 DIAGNOSIS — D1801 Hemangioma of skin and subcutaneous tissue: Secondary | ICD-10-CM | POA: Diagnosis not present

## 2021-04-24 DIAGNOSIS — L89899 Pressure ulcer of other site, unspecified stage: Secondary | ICD-10-CM | POA: Diagnosis not present

## 2021-04-24 DIAGNOSIS — I8311 Varicose veins of right lower extremity with inflammation: Secondary | ICD-10-CM | POA: Diagnosis not present

## 2021-04-24 DIAGNOSIS — L821 Other seborrheic keratosis: Secondary | ICD-10-CM | POA: Diagnosis not present

## 2021-04-24 DIAGNOSIS — D225 Melanocytic nevi of trunk: Secondary | ICD-10-CM | POA: Diagnosis not present

## 2021-04-29 ENCOUNTER — Ambulatory Visit (INDEPENDENT_AMBULATORY_CARE_PROVIDER_SITE_OTHER): Payer: Medicare Other

## 2021-04-29 ENCOUNTER — Other Ambulatory Visit: Payer: Self-pay

## 2021-04-29 VITALS — BP 108/60 | HR 58 | Temp 97.4°F | Resp 16 | Ht 69.0 in | Wt 194.6 lb

## 2021-04-29 DIAGNOSIS — Z0000 Encounter for general adult medical examination without abnormal findings: Secondary | ICD-10-CM

## 2021-04-29 NOTE — Patient Instructions (Signed)
[name] , Thank you for taking time to come for your Medicare Wellness Visit. I appreciate your ongoing commitment to your health goals. Please review the following plan we discussed and let me know if I can assist you in the future.   Screening recommendations/referrals: Colonoscopy: No longer required Recommended yearly ophthalmology/optometry visit for glaucoma screening and checkup Recommended yearly dental visit for hygiene and checkup  Vaccinations: Influenza vaccine: Up to date Pneumococcal vaccine: Up to date Tdap vaccine: Up to date-Due-09/02/2023 Shingles vaccine: Discuss with pharmacy   Covid-19: Booster due  Advanced directives: Please bring a copy for your chart  Conditions/risks identified: See problem list  Next appointment: Follow up in one year for your annual wellness visit.   Preventive Care 23 Years and Older, Male Preventive care refers to lifestyle choices and visits with your health care provider that can promote health and wellness. What does preventive care include? A yearly physical exam. This is also called an annual well check. Dental exams once or twice a year. Routine eye exams. Ask your health care provider how often you should have your eyes checked. Personal lifestyle choices, including: Daily care of your teeth and gums. Regular physical activity. Eating a healthy diet. Avoiding tobacco and drug use. Limiting alcohol use. Practicing safe sex. Taking low doses of aspirin every day. Taking vitamin and mineral supplements as recommended by your health care provider. What happens during an annual well check? The services and screenings done by your health care provider during your annual well check will depend on your age, overall health, lifestyle risk factors, and family history of disease. Counseling  Your health care provider may ask you questions about your: Alcohol use. Tobacco use. Drug use. Emotional well-being. Home and relationship  well-being. Sexual activity. Eating habits. History of falls. Memory and ability to understand (cognition). Work and work Statistician. Screening  You may have the following tests or measurements: Height, weight, and BMI. Blood pressure. Lipid and cholesterol levels. These may be checked every 5 years, or more frequently if you are over 18 years old. Skin check. Lung cancer screening. You may have this screening every year starting at age 45 if you have a 30-pack-year history of smoking and currently smoke or have quit within the past 15 years. Fecal occult blood test (FOBT) of the stool. You may have this test every year starting at age 61. Flexible sigmoidoscopy or colonoscopy. You may have a sigmoidoscopy every 5 years or a colonoscopy every 10 years starting at age 57. Prostate cancer screening. Recommendations will vary depending on your family history and other risks. Hepatitis C blood test. Hepatitis B blood test. Sexually transmitted disease (STD) testing. Diabetes screening. This is done by checking your blood sugar (glucose) after you have not eaten for a while (fasting). You may have this done every 1-3 years. Abdominal aortic aneurysm (AAA) screening. You may need this if you are a current or former smoker. Osteoporosis. You may be screened starting at age 32 if you are at high risk. Talk with your health care provider about your test results, treatment options, and if necessary, the need for more tests. Vaccines  Your health care provider may recommend certain vaccines, such as: Influenza vaccine. This is recommended every year. Tetanus, diphtheria, and acellular pertussis (Tdap, Td) vaccine. You may need a Td booster every 10 years. Zoster vaccine. You may need this after age 86. Pneumococcal 13-valent conjugate (PCV13) vaccine. One dose is recommended after age 54. Pneumococcal polysaccharide (PPSV23) vaccine.  One dose is recommended after age 77. Talk to your health care  provider about which screenings and vaccines you need and how often you need them. This information is not intended to replace advice given to you by your health care provider. Make sure you discuss any questions you have with your health care provider. Document Released: 10/04/2015 Document Revised: 05/27/2016 Document Reviewed: 07/09/2015 Elsevier Interactive Patient Education  2017 [place] Prevention in the Home Falls can cause injuries. They can happen to people of all ages. There are many things you can do to make your home safe and to help prevent falls. What can I do on the outside of my home? Regularly fix the edges of walkways and driveways and fix any cracks. Remove anything that might make you trip as you walk through a door, such as a raised step or threshold. Trim any bushes or trees on the path to your home. Use bright outdoor lighting. Clear any walking paths of anything that might make someone trip, such as rocks or tools. Regularly check to see if handrails are loose or broken. Make sure that both sides of any steps have handrails. Any raised decks and porches should have guardrails on the edges. Have any leaves, snow, or ice cleared regularly. Use sand or salt on walking paths during winter. Clean up any spills in your garage right away. This includes oil or grease spills. What can I do in the bathroom? Use night lights. Install grab bars by the toilet and in the tub and shower. Do not use towel bars as grab bars. Use non-skid mats or decals in the tub or shower. If you need to sit down in the shower, use a plastic, non-slip stool. Keep the floor dry. Clean up any water that spills on the floor as soon as it happens. Remove soap buildup in the tub or shower regularly. Attach bath mats securely with double-sided non-slip rug tape. Do not have throw rugs and other things on the floor that can make you trip. What can I do in the bedroom? Use night lights. Make  sure that you have a light by your bed that is easy to reach. Do not use any sheets or blankets that are too big for your bed. They should not hang down onto the floor. Have a firm chair that has side arms. You can use this for support while you get dressed. Do not have throw rugs and other things on the floor that can make you trip. What can I do in the kitchen? Clean up any spills right away. Avoid walking on wet floors. Keep items that you use a lot in easy-to-reach places. If you need to reach something above you, use a strong step stool that has a grab bar. Keep electrical cords out of the way. Do not use floor polish or wax that makes floors slippery. If you must use wax, use non-skid floor wax. Do not have throw rugs and other things on the floor that can make you trip. What can I do with my stairs? Do not leave any items on the stairs. Make sure that there are handrails on both sides of the stairs and use them. Fix handrails that are broken or loose. Make sure that handrails are as long as the stairways. Check any carpeting to make sure that it is firmly attached to the stairs. Fix any carpet that is loose or worn. Avoid having throw rugs at the top or bottom of  the stairs. If you do have throw rugs, attach them to the floor with carpet tape. Make sure that you have a light switch at the top of the stairs and the bottom of the stairs. If you do not have them, ask someone to add them for you. What else can I do to help prevent falls? Wear shoes that: Do not have high heels. Have rubber bottoms. Are comfortable and fit you well. Are closed at the toe. Do not wear sandals. If you use a stepladder: Make sure that it is fully opened. Do not climb a closed stepladder. Make sure that both sides of the stepladder are locked into place. Ask someone to hold it for you, if possible. Clearly mark and make sure that you can see: Any grab bars or handrails. First and last steps. Where the  edge of each step is. Use tools that help you move around (mobility aids) if they are needed. These include: Canes. Walkers. Scooters. Crutches. Turn on the lights when you go into a dark area. Replace any light bulbs as soon as they burn out. Set up your furniture so you have a clear path. Avoid moving your furniture around. If any of your floors are uneven, fix them. If there are any pets around you, be aware of where they are. Review your medicines with your doctor. Some medicines can make you feel dizzy. This can increase your chance of falling. Ask your doctor what other things that you can do to help prevent falls. This information is not intended to replace advice given to you by your health care provider. Make sure you discuss any questions you have with your health care provider. Document Released: 07/04/2009 Document Revised: 02/13/2016 Document Reviewed: 10/12/2014 Elsevier Interactive Patient Education  2017 [employer].

## 2021-04-29 NOTE — Progress Notes (Addendum)
Subjective:   [name] is a 85 y.o. male who presents for Medicare Annual/Subsequent preventive examination.  Review of Systems     Cardiac Risk Factors include: advanced age (>37mn, >>68women);male gender;dyslipidemia;hypertension;sedentary lifestyle     Objective:    Today's Vitals   04/29/21 1507  BP: 108/60  Pulse: (!) 58  Resp: 16  Temp: (!) 97.4 F (36.3 C)  TempSrc: Temporal  SpO2: 93%  Weight: 194 lb 9.6 oz (88.3 kg)  Height: '5\' 9"'$  (1.753 m)   Body mass index is 28.74 kg/m.  Advanced Directives 04/29/2021 04/05/2020 05/19/2019 07/02/2016 06/25/2014 06/25/2014 06/18/2014  Does Patient Have a Medical Advance Directive? Yes Yes Yes Yes Yes - Yes  Type of Advance Directive [place]Living will [place]rangeLiving will [place]leyLiving will [place]unseithLiving will Living will - Living will  Does patient want to make changes to medical advance directive? - No - Patient declined No - Patient declined No - Patient declined No - Patient declined - No - Patient declined  Copy of [place]in Chart? No - copy requested No - copy requested - No - copy requested No - copy requested (No Data) No - copy requested    Current Medications (verified) Outpatient Encounter Medications as of 04/29/2021  Medication Sig   aspirin 81 MG tablet Take 1 tablet (81 mg total) by mouth daily. (Patient taking differently: Take 81 mg by mouth daily. bedtime)   atorvastatin (LIPITOR) 40 MG tablet Take 1 tablet (40 mg total) by mouth at bedtime. (Patient taking differently: Take 60 mg by mouth at bedtime.)   benazepril (LOTENSIN) 20 MG tablet Take 1.5 tablets (30 mg total) by mouth daily.   clobetasol cream (TEMOVATE) 0AB-123456789% Apply 1 application topically 2 (two) times daily as needed.   diltiazem (DILT-XR) 180 MG 24 hr capsule Take 1 capsule (180 mg total) by mouth daily.   ELIQUIS 5 MG TABS tablet TAKE 1 TABLET  BY MOUTH TWICE DAILY   furosemide (LASIX) 40 MG tablet TAKE 1 AND 1/2 TABLETS(60 MG) BY MOUTH TWICE DAILY   levothyroxine (SYNTHROID) 150 MCG tablet TAKE 1 TABLET BY MOUTH EVERY DAY BEFORE BREAKFAST, ALONG WITH A 25MCG TABLET DAILY   levothyroxine (SYNTHROID) 25 MCG tablet TAKE 1 TABLET BY MOUTH DAILY BEFORE BREAKFAST ALONG WITH A 150MCG TABLET DAILY   LUMIGAN 0.01 % SOLN Place 1 drop into both eyes at bedtime.   metoprolol tartrate (LOPRESSOR) 25 MG tablet Take 0.5 tablets (12.5 mg total) by mouth 2 (two) times daily.   nitroGLYCERIN (NITROSTAT) 0.4 MG SL tablet PLACE 1 TABLET UNDER THE TONGUE EVERY 5 MINUTES AS NEEDED FOR CHEST PAIN   Timolol Maleate 0.5 % (DAILY) SOLN Apply 1 drop to eye in the morning and at bedtime.   No facility-administered encounter medications on file as of 04/29/2021.    Allergies (verified) Amlodipine besylate   History: Past Medical History:  Diagnosis Date   Arthritis    BPH (benign prostatic hypertrophy)    Carotid artery disease (HCC)    Doppler, December 08, 2011, 00 [id number]. ICA, [id number], followup 1 year   Coronary artery disease    DES Circumflex or CVA 2006  /  clear, February, 2011, EF 70%, no ischemia or   Ejection fraction    EF 60%, echo, 2009, mildly calcified aortic leaflets   History of colonic polyps    Hyperlipidemia    Hypertension  Hypothyroidism    Lumbar radiculopathy    Multiple thyroid nodules    Avascular echogenic areas noted in the right thyroid at the time of carotid Doppler.    PONV (postoperative nausea and vomiting)    "nausea with first hip surgery"   Skin cancer (melanoma) ([place])    PMH of    Spinal stenosis, lumbar    Tremor    Fine tremor right upper extremity   Venous insufficiency    Past Surgical History:  Procedure Laterality Date   COLONOSCOPY     CORONARY ANGIOPLASTY WITH STENT PLACEMENT  2006   x 2 stents; DES to CX and dRCA '06   KNEE SURGERY  1970's   "chipped bone"   LUMBAR LAMINECTOMY/DECOMPRESSION  MICRODISCECTOMY Left 07/13/2016   Procedure: LUMBAR LAMINECTOMY AND FORAMINOTOMY Lumbar two, three, Lumbar three-four, Lumbar four-five, LEFT Lumbar five-Sacral one DISECTOMY;  Surgeon: Newman Pies, MD;  Location: [facility];  Service: Neurosurgery;  Laterality: Left;   TOE SURGERY  1997   TONSILLECTOMY     TOTAL HIP ARTHROPLASTY Left 2007   TOTAL HIP ARTHROPLASTY Right 2010   TOTAL KNEE ARTHROPLASTY Right 06/25/2014   Procedure: RIGHT TOTAL KNEE ARTHROPLASTY;  Surgeon: Gearlean Alf, MD;  Location: [facility];  Service: Orthopedics;  Laterality: Right;   Family History  Problem Relation Age of Onset   Coronary artery disease Mother        carotid endarterectomy bilaterally   Colon cancer Father 38   Hypertension Father    Pancreatitis Father    Diabetes Sister    Diabetes Maternal Grandfather    Heart attack Maternal Grandfather 62   Heart attack Sister 65   Thyroid disease Neg Hx    Stroke Neg Hx    Prostate cancer Neg Hx    Social History   Socioeconomic History   Marital status: Married    Spouse name: Not on file   Number of children: 2   Years of education: Not on file   Highest education level: Not on file  Occupational History   Occupation: retired, Nurse, learning disability  Tobacco Use   Smoking status: Former    Types: Cigarettes    Quit date: 09/21/1984    Years since quitting: 36.6   Smokeless tobacco: Never   Tobacco comments:    smoked 1958-1986, up to 1 ppd  Vaping Use   Vaping Use: Never used  Substance and Sexual Activity   Alcohol use: Yes    Comment: socially on occasion   Drug use: No   Sexual activity: Not on file  Other Topics Concern   Not on file  Social History Narrative   Household- pt and wife   2 daughters, one is a Therapist, sports @ [employer]   Social Determinants of Radio broadcast assistant Strain: Low Risk    Difficulty of Paying Living Expenses: Not hard at all  Food Insecurity: No Food Insecurity   Worried About Charity fundraiser  in the Last Year: Never true   Arboriculturist in the Last Year: Never true  Transportation Needs: No Transportation Needs   Lack of Transportation (Medical): No   Lack of Transportation (Non-Medical): No  Physical Activity: Inactive   Days of Exercise per Week: 0 days   Minutes of Exercise per Session: 0 min  Stress: Stress Concern Present   Feeling of Stress : To some extent  Social Connections: Moderately Integrated   Frequency of Communication with Friends and Family: More  than three times a week   Frequency of Social Gatherings with Friends and Family: More than three times a week   Attends Religious Services: Never   Marine scientist or Organizations: Yes   Attends Archivist Meetings: 1 to 4 times per year   Marital Status: Married    Tobacco Counseling Counseling given: Not Answered Tobacco comments: smoked 1958-1986, up to 1 ppd   Clinical Intake:  Pre-visit preparation completed: Yes  Pain : No/denies pain     Nutritional Status: BMI 25 -29 Overweight Nutritional Risks: None Diabetes: No  How often do you need to have someone help you when you read instructions, pamphlets, or other written materials from your doctor or pharmacy?: 1 - Never  Diabetic?No  Interpreter Needed?: No  Information entered by :: Caroleen Hamman LPn   Activities of Daily Living In your present state of health, do you have any difficulty performing the following activities: 04/29/2021  Hearing? N  Vision? N  Difficulty concentrating or making decisions? N  Walking or climbing stairs? N  Dressing or bathing? N  Doing errands, shopping? N  Preparing Food and eating ? N  Using the Toilet? N  In the past six months, have you accidently leaked urine? N  Do you have problems with loss of bowel control? N  Managing your Medications? N  Managing your Finances? N  Housekeeping or managing your Housekeeping? N  Some recent data might be hidden    Patient Care  Team: Colon Branch, MD as PCP - General (Internal Medicine) Josue Hector, MD as Consulting Physician (Cardiology) Allyn Kenner, MD (Dermatology) Gaynelle Arabian, MD as Consulting Physician (Orthopedic Surgery) Newman Pies, MD as Consulting Physician (Neurosurgery) Marygrace Drought, MD as Consulting Physician (Ophthalmology) Cherre Robins, PharmD (Pharmacist)  Indicate any recent Medical Services you may have received from other than [facility] providers in the past year (date may be approximate).     Assessment:   This is a routine wellness examination for [place].  Hearing/Vision screen Hearing Screening - Comments:: No issues Vision Screening - Comments:: Last eye exam-12/2020  Dietary issues and exercise activities discussed: Current Exercise Habits: The patient does not participate in regular exercise at present, Exercise limited by: None identified   Goals Addressed             This Visit's Progress    Increase physical activity   Not on track      Depression Screen PHQ 2/9 Scores 04/29/2021 02/24/2021 10/25/2020 04/05/2020 08/14/2019 05/11/2018 05/11/2017  PHQ - 2 Score 0 0 0 0 0 0 0    Fall Risk Fall Risk  04/29/2021 02/24/2021 10/25/2020 04/05/2020 05/10/2019  Falls in the past year? 0 0 0 0 0  Number falls in past yr: 0 0 0 0 -  Injury with Fall? 0 0 0 0 -  Follow up Falls prevention discussed Falls evaluation completed - Education provided;Falls prevention discussed -    FALL RISK PREVENTION PERTAINING TO THE HOME:  Any stairs in or around the home? Yes  If so, are there any without handrails? No  Home free of loose throw rugs in walkways, pet beds, electrical cords, etc? Yes  Adequate lighting in your home to reduce risk of falls? Yes   ASSISTIVE DEVICES UTILIZED TO PREVENT FALLS:  Life alert? No  Use of a cane, walker or w/c? Yes  Grab bars in the bathroom? Yes  Shower chair or bench in shower? No  Elevated toilet seat or  a handicapped toilet? No   TIMED UP AND  GO:  Was the test performed? Yes .  Length of time to ambulate 10 feet: 11 sec.   Gait slow and steady with assistive device  Cognitive Function:Normal cognitive status assessed by direct observation by this Nurse Health Advisor. No abnormalities found.          Immunizations Immunization History  Administered Date(s) Administered   Fluad Quad(high Dose 65+) 08/04/2019, 10/25/2020   Influenza Split 08/12/2011, 09/06/2012   Influenza Whole 07/23/2006, 08/10/2007, 06/27/2008, 08/21/2009, 08/19/2010   Influenza, High Dose Seasonal PF 07/20/2013, 09/19/2015, 09/07/2018   Influenza,inj,Quad PF,6+ Mos 07/14/2016   Influenza-Unspecified 06/21/2014, 10/01/2017   PFIZER(Purple Top)SARS-COV-2 Vaccination 11/23/2019, 12/19/2019   Pneumococcal Conjugate-13 02/19/2015   Pneumococcal Polysaccharide-23 09/01/2013   Td 09/23/1998   Tdap 09/01/2013   Zoster, Live 09/29/2013    TDAP status: Up to date  Flu Vaccine status: Up to date  Pneumococcal vaccine status: Up to date  Covid-19 vaccine status: Information provided on how to obtain vaccines. Booster due  Qualifies for Shingles Vaccine? Yes   Zostavax completed Yes   Shingrix Completed?: No.    Education has been provided regarding the importance of this vaccine. Patient has been advised to call insurance company to determine out of pocket expense if they have not yet received this vaccine. Advised may also receive vaccine at local pharmacy or Health Dept. Verbalized acceptance and understanding.  Screening Tests Health Maintenance  Topic Date Due   Zoster Vaccines- Shingrix (1 of 2) Never done   COVID-19 Vaccine (3 - Booster for Pfizer series) 05/20/2020   INFLUENZA VACCINE  04/21/2021   TETANUS/TDAP  09/02/2023   PNA vac Low Risk Adult  Completed   HPV VACCINES  Aged Out    Health Maintenance  Health Maintenance Due  Topic Date Due   Zoster Vaccines- Shingrix (1 of 2) Never done   COVID-19 Vaccine (3 - Booster for Pfizer  series) 05/20/2020   INFLUENZA VACCINE  04/21/2021    Colorectal cancer screening: No longer required.   Lung Cancer Screening: (Low Dose CT Chest recommended if Age 68-80 years, 30 pack-year currently smoking OR have quit w/in 15years.) does not qualify.     Additional Screening:  Hepatitis C Screening: does not qualify  Vision Screening: Recommended annual ophthalmology exams for early detection of glaucoma and other disorders of the eye. Is the patient up to date with their annual eye exam?  Yes  Who is the provider or what is the name of the office in which the patient attends annual eye exams? Dr. Satira Sark   Dental Screening: Recommended annual dental exams for proper oral hygiene  Community Resource Referral / Chronic Care Management: CRR required this visit?  No   CCM required this visit?  No      Plan:     I have personally reviewed and noted the following in the patient's chart:   Medical and social history Use of alcohol, tobacco or illicit drugs  Current medications and supplements including opioid prescriptions. Patient is not currently taking opioid prescriptions. Functional ability and status Nutritional status Physical activity Advanced directives List of other physicians Hospitalizations, surgeries, and ER visits in previous 12 months Vitals Screenings to include cognitive, depression, and falls Referrals and appointments  In addition, I have reviewed and discussed with patient certain preventive protocols, quality metrics, and best practice recommendations. A written personalized care plan for preventive services as well as general preventive health recommendations were provided to  patient.   Patient to access avs on mychart.  Marta Antu, LPN   [id number]  Nurse Health Advisor  Nurse Notes: None   I have reviewed and agree with Health Coaches documentation.  [name], MD

## 2021-05-10 ENCOUNTER — Other Ambulatory Visit: Payer: Self-pay | Admitting: Cardiovascular Disease

## 2021-05-12 NOTE — Telephone Encounter (Signed)
Eliquis '5mg'$  refill request received. Patient is 85 years old, weight-88.3kg, Crea-0.98 on 7/18/22022, Diagnosis-Afib, and last seen by Dr. Johnsie Cancel on 11/18/2020. Dose is appropriate based on dosing criteria. Will send in refill to requested pharmacy.

## 2021-06-03 ENCOUNTER — Telehealth: Payer: Self-pay | Admitting: Pharmacist

## 2021-06-03 NOTE — Chronic Care Management (AMB) (Signed)
    Chronic Care Management Pharmacy Assistant   Name: ROARK HOLIK  MRN: [id number] DOB: 03-12-1936  Reason for Encounter: Disease State Hypertension  Recent office visits:  04/07/21-Jose Ladona Horns, MD (PCP) Seen for hospital follow up. Labs ordered. If you are not completely back to normal in 2 to 3 weeks let us know.  Recent consult visits:  None noted  Hospital visits:  None in previous 6 months  Medications: Outpatient Encounter Medications as of 06/03/2021  Medication Sig Note   aspirin 81 MG tablet Take 1 tablet (81 mg total) by mouth daily. (Patient taking differently: Take 81 mg by mouth daily. bedtime)    atorvastatin (LIPITOR) 40 MG tablet Take 1 tablet (40 mg total) by mouth at bedtime. (Patient taking differently: Take 60 mg by mouth at bedtime.)    benazepril (LOTENSIN) 20 MG tablet Take 1.5 tablets (30 mg total) by mouth daily.    clobetasol cream (TEMOVATE) [id number] % Apply 1 application topically 2 (two) times daily as needed.    diltiazem (DILT-XR) 180 MG 24 hr capsule Take 1 capsule (180 mg total) by mouth daily.    ELIQUIS 5 MG TABS tablet TAKE 1 TABLET BY MOUTH TWICE DAILY    furosemide (LASIX) 40 MG tablet TAKE 1 AND 1/2 TABLETS(60 MG) BY MOUTH TWICE DAILY    levothyroxine (SYNTHROID) 150 MCG tablet TAKE 1 TABLET BY MOUTH EVERY DAY BEFORE BREAKFAST, ALONG WITH A 25MCG TABLET DAILY    levothyroxine (SYNTHROID) 25 MCG tablet TAKE 1 TABLET BY MOUTH DAILY BEFORE BREAKFAST ALONG WITH A 150MCG TABLET DAILY    LUMIGAN 0.01 % SOLN Place 1 drop into both eyes at bedtime.    metoprolol tartrate (LOPRESSOR) 25 MG tablet Take 0.5 tablets (12.5 mg total) by mouth 2 (two) times daily.    nitroGLYCERIN (NITROSTAT) 0.4 MG SL tablet PLACE 1 TABLET UNDER THE TONGUE EVERY 5 MINUTES AS NEEDED FOR CHEST PAIN 02/24/2021: PRN   Timolol Maleate 0.5 % (DAILY) SOLN Apply 1 drop to eye in the morning and at bedtime.    No facility-administered encounter medications on file as of 06/03/2021.    Current antihypertensive regimen:  Benazepril '20mg'$  1.5 tablets daily Diltiazem XR '180mg'$  daily Metoprolol Tartrate 0.5 tablet twice daily  How often are you checking your Blood Pressure?        Patient states he checks his blood pressure once a month.  Current home BP readings:         06/04/21-107/59 Pulse:54         What recent interventions/DTPs have been made by any provider to improve Blood Pressure control since last CPP Visit: None noted  Any recent hospitalizations or ED visits since last visit with CPP? No  What diet changes have been made to improve Blood Pressure Control?  Patient states he has made diet changes and has cut back on eating and has lost weight since starting his diet.   What exercise is being done to improve your Blood Pressure Control?  Patient states he does do some minimal walking around his house.  Adherence Review: Is the patient currently on ACE/ARB medication? Yes Does the patient have >5 day gap between last estimated fill dates? No   Star Rating Drugs: Atorvastatin 40 mg Last filled:05/07/21 90 DS Benazepril 20 mg Last filled:04/09/21 90 DS  Myriam Elta [place], [place]

## 2021-06-05 ENCOUNTER — Other Ambulatory Visit: Payer: Self-pay | Admitting: Cardiovascular Disease

## 2021-06-05 DIAGNOSIS — R601 Generalized edema: Secondary | ICD-10-CM

## 2021-06-26 DIAGNOSIS — C44519 Basal cell carcinoma of skin of other part of trunk: Secondary | ICD-10-CM | POA: Diagnosis not present

## 2021-06-26 DIAGNOSIS — L98429 Non-pressure chronic ulcer of back with unspecified severity: Secondary | ICD-10-CM | POA: Diagnosis not present

## 2021-06-26 DIAGNOSIS — I872 Venous insufficiency (chronic) (peripheral): Secondary | ICD-10-CM | POA: Diagnosis not present

## 2021-07-05 ENCOUNTER — Other Ambulatory Visit: Payer: Self-pay | Admitting: Internal Medicine

## 2021-07-31 ENCOUNTER — Ambulatory Visit: Payer: Medicare Other | Admitting: Internal Medicine

## 2021-08-04 ENCOUNTER — Encounter: Payer: Self-pay | Admitting: Internal Medicine

## 2021-08-04 ENCOUNTER — Other Ambulatory Visit: Payer: Self-pay

## 2021-08-04 ENCOUNTER — Ambulatory Visit (INDEPENDENT_AMBULATORY_CARE_PROVIDER_SITE_OTHER): Payer: Medicare Other | Admitting: Internal Medicine

## 2021-08-04 VITALS — BP 136/68 | HR 58 | Temp 98.2°F | Resp 18 | Ht 69.0 in | Wt 194.2 lb

## 2021-08-04 DIAGNOSIS — I10 Essential (primary) hypertension: Secondary | ICD-10-CM

## 2021-08-04 DIAGNOSIS — E782 Mixed hyperlipidemia: Secondary | ICD-10-CM | POA: Diagnosis not present

## 2021-08-04 DIAGNOSIS — Z23 Encounter for immunization: Secondary | ICD-10-CM

## 2021-08-04 DIAGNOSIS — E039 Hypothyroidism, unspecified: Secondary | ICD-10-CM

## 2021-08-04 DIAGNOSIS — R739 Hyperglycemia, unspecified: Secondary | ICD-10-CM | POA: Diagnosis not present

## 2021-08-04 LAB — COMPREHENSIVE METABOLIC PANEL
ALT: 18 U/L (ref 0–53)
AST: 21 U/L (ref 0–37)
Albumin: 4.1 g/dL (ref 3.5–5.2)
Alkaline Phosphatase: 80 U/L (ref 39–117)
BUN: 22 mg/dL (ref 6–23)
CO2: 30 mEq/L (ref 19–32)
Calcium: 9.5 mg/dL (ref 8.4–10.5)
Chloride: 106 mEq/L (ref 96–112)
Creatinine, Ser: 1.06 mg/dL (ref 0.40–1.50)
GFR: 64.96 mL/min (ref >60.00)
Glucose, Bld: 78 mg/dL (ref 70–99)
Potassium: 4.6 mEq/L (ref 3.5–5.1)
Sodium: 142 mEq/L (ref 135–145)
Total Bilirubin: 0.5 mg/dL (ref 0.2–1.2)
Total Protein: 6.4 g/dL (ref 6.0–8.3)

## 2021-08-04 LAB — LIPID PANEL
Cholesterol: 131 mg/dL (ref 0–200)
HDL: 41.1 mg/dL (ref >39.00)
LDL Cholesterol: 68 mg/dL (ref 0–99)
NonHDL: 89.78
Total CHOL/HDL Ratio: 3
Triglycerides: 108 mg/dL (ref 0.0–149.0)
VLDL: 21.6 mg/dL (ref 0.0–40.0)

## 2021-08-04 LAB — TSH: TSH: 1.99 u[IU]/mL (ref 0.35–5.50)

## 2021-08-04 LAB — HEMOGLOBIN A1C: Hgb A1c MFr Bld: 6.3 % (ref 4.6–6.5)

## 2021-08-04 MED ORDER — ATORVASTATIN CALCIUM 40 MG PO TABS: 60.0000 mg | ORAL_TABLET | Freq: Every day | ORAL | Status: DC

## 2021-08-04 NOTE — Progress Notes (Signed)
Subjective:    Patient ID: [name], male    DOB: [birth year], 85 y.o.   MRN: [id number]  DOS:  08/04/2021 Type of visit - description: f/u  Since the last office visit he is doing well. Good compliance with medications. He remains active, doing some yard work, no recent falls. Denies chest pain. R leg edema at baseline No nosebleeds or any other unusual bleeding  Review of Systems See above   Past Medical History:  Diagnosis Date   Arthritis    BPH (benign prostatic hypertrophy)    Carotid artery disease (HCC)    Doppler, December 08, 2011, 00 00% R. ICA, 92-33% LICA, followup 1 year   Coronary artery disease    DES Circumflex or CVA 2006  /  clear, February, 2011, EF 70%, no ischemia or   Ejection fraction    EF 60%, echo, 2009, mildly calcified aortic leaflets   History of colonic polyps    Hyperlipidemia    Hypertension    Hypothyroidism    Lumbar radiculopathy    Multiple thyroid nodules    Avascular echogenic areas noted in the right thyroid at the time of carotid Doppler.    PONV (postoperative nausea and vomiting)    "nausea with first hip surgery"   Skin cancer (melanoma) ([place])    PMH of    Spinal stenosis, lumbar    Tremor    Fine tremor right upper extremity   Venous insufficiency     Past Surgical History:  Procedure Laterality Date   COLONOSCOPY     CORONARY ANGIOPLASTY WITH STENT PLACEMENT  2006   x 2 stents; DES to CX and dRCA '06   KNEE SURGERY  1970's   "chipped bone"   LUMBAR LAMINECTOMY/DECOMPRESSION MICRODISCECTOMY Left 07/13/2016   Procedure: LUMBAR LAMINECTOMY AND FORAMINOTOMY Lumbar two, three, Lumbar three-four, Lumbar four-five, LEFT Lumbar five-Sacral one DISECTOMY;  Surgeon: Newman Pies, MD;  Location: [facility];  Service: Neurosurgery;  Laterality: Left;   TOE SURGERY  1997   TONSILLECTOMY     TOTAL HIP ARTHROPLASTY Left 2007   TOTAL HIP ARTHROPLASTY Right 2010   TOTAL KNEE ARTHROPLASTY Right 06/25/2014   Procedure: RIGHT TOTAL  KNEE ARTHROPLASTY;  Surgeon: Gearlean Alf, MD;  Location: [facility];  Service: Orthopedics;  Laterality: Right;    Allergies as of 08/04/2021       Reactions   Amlodipine Besylate Other (See Comments)   REACTION: edema        Medication List        Accurate as of August 04, 2021  8:57 PM. If you have any questions, ask your nurse or doctor.          aspirin 81 MG tablet Take 1 tablet (81 mg total) by mouth daily. What changed: additional instructions   atorvastatin 40 MG tablet Commonly known as: LIPITOR Take 1.5 tablets (60 mg total) by mouth at bedtime.   benazepril 20 MG tablet Commonly known as: LOTENSIN TAKE 1 AND 1/2 TABLETS(30 MG) BY MOUTH DAILY   clobetasol cream 0.05 % Commonly known as: TEMOVATE Apply 1 application topically 2 (two) times daily as needed.   Dilt-XR 180 MG 24 hr capsule Generic drug: diltiazem TAKE 1 CAPSULE(180 MG) BY MOUTH DAILY   Eliquis 5 MG Tabs tablet Generic drug: apixaban TAKE 1 TABLET BY MOUTH TWICE DAILY   furosemide 40 MG tablet Commonly known as: LASIX TAKE 1 AND 1/2 TABLETS(60 MG) BY MOUTH TWICE DAILY   levothyroxine 150 MCG  tablet Commonly known as: SYNTHROID TAKE 1 TABLET BY MOUTH EVERY DAY BEFORE BREAKFAST, ALONG WITH A 25MCG TABLET DAILY   levothyroxine 25 MCG tablet Commonly known as: SYNTHROID TAKE 1 TABLET BY MOUTH DAILY BEFORE BREAKFAST ALONG WITH A 150MCG TABLET DAILY   Lumigan 0.01 % Soln Generic drug: bimatoprost Place 1 drop into both eyes at bedtime.   metoprolol tartrate 25 MG tablet Commonly known as: LOPRESSOR Take 0.5 tablets (12.5 mg total) by mouth 2 (two) times daily.   nitroGLYCERIN 0.4 MG SL tablet Commonly known as: NITROSTAT PLACE 1 TABLET UNDER THE TONGUE EVERY 5 MINUTES AS NEEDED FOR CHEST PAIN   Timolol Maleate (Once-Daily) 0.5 % Soln Apply 1 drop to eye in the morning and at bedtime.           Objective:   Physical Exam BP 136/68 (BP Location: Left Arm, Patient  Position: Sitting, Cuff Size: Small)   Pulse (!) 58   Temp 98.2 F (36.8 C) (Oral)   Resp 18   Ht 5\' 9"  (1.753 m)   Wt 194 lb 4 oz (88.1 kg)   SpO2 97%   BMI 28.69 kg/m  General:   Well developed, NAD, BMI noted. HEENT:  Normocephalic . Face symmetric, atraumatic Lungs:  CTA B Normal respiratory effort, no intercostal retractions, no accessory muscle use. Heart: Distant heart sounds, regular, no murmur Lower extremities: + R leg edema noted. Skin: Not pale. Not jaundice Neurologic:  alert & oriented X3.  Speech normal, gait assisted by cane. Psych--  Cognition and judgment appear intact.  Cooperative with normal attention span and concentration.  Behavior appropriate. No anxious or depressed appearing.      Assessment     Assessment  (Transfer from  Dr. Linna Darner [phone]) Hyperglycemia, A1c 6.2 2013 HTN Hyperlipidemia Hypothyroidism  Thyroid US 2014-- no nodules, inhomogeneous  CV: --CAD: stents 2006;  low risk nuclear scan 2011, 03-2016 --A Fib dx 09/2018 at regular crads OV, on Eliquis, EF --Carotid artery disease [place] 12/2014:   Stable, 1-39% RICA stenosis. Stable, 48-88% LICA stenosis. [place] 12/2015  [place] 04/2020: 0-39% B R leg edema, [place] (-)   DVT 08-2014 and 01/2019 [facility] DJD: Dr Maureen Ralphs, back surgery Dr Arnoldo Morale 06-2016 Venous insufficiency, chronic R>L edema Tremor Glaucoma H/o skin cancer , denies Melanoma; sees derm, had a visit ~ 09/2017, Dr [place] Crane  PLAN: Hyperglycemia: Check A1c HTN: Reports good compliance with benazepril, metoprolol, diltiazem, Lasix, check a CMP Hyperlipidemia: Based on last FLP we increase Lipitor from 40 mg to 60 mg.  Check labs. Hypothyroidism: On Synthroid 175 mcg, check labs. CAD: Asymptomatic Gait disorder: Uses a cane, no recent falls, still active does yard work. Preventive care: Flu shot today.  COVID booster recommended RTC 3 months CPX    This visit occurred during the SARS-CoV-2 public health emergency.  Safety protocols were in  place, including screening questions prior to the visit, additional usage of staff PPE, and extensive cleaning of exam room while observing appropriate contact time as indicated for disinfecting solutions.

## 2021-08-04 NOTE — Assessment & Plan Note (Signed)
Assessment  (Transfer from  Dr. Linna Darner [phone]) Hyperglycemia, A1c 6.2 2013 HTN Hyperlipidemia Hypothyroidism  Thyroid US 2014-- no nodules, inhomogeneous  CV: --CAD: stents 2006;  low risk nuclear scan 2011, 03-2016 --A Fib dx 09/2018 at regular crads OV, on Eliquis, EF --Carotid artery disease [place] 12/2014:   Stable, 1-39% RICA stenosis. Stable, 37-44% LICA stenosis. [place] 12/2015  [place] 04/2020: 0-39% B R leg edema, [place] (-)   DVT 08-2014 and 01/2019 [facility] DJD: Dr Maureen Ralphs, back surgery Dr Arnoldo Morale 06-2016 Venous insufficiency, chronic R>L edema Tremor Glaucoma H/o skin cancer , denies Melanoma; sees derm, had a visit ~ 09/2017, Dr Nevada Crane  PLAN: Hyperglycemia: Check A1c HTN: Reports good compliance with benazepril, metoprolol, diltiazem, Lasix, check a CMP Hyperlipidemia: Based on last FLP we increase Lipitor from 40 mg to 60 mg.  Check labs. Hypothyroidism: On Synthroid 175 mcg, check labs. CAD: Asymptomatic Gait disorder: Uses a cane, no recent falls, still active does yard work. Preventive care: Flu shot today.  COVID booster recommended RTC 3 months CPX

## 2021-08-04 NOTE — Patient Instructions (Addendum)
Check the  blood pressure weekly. BP GOAL is between 110/65 and  135/85. If it is consistently higher or lower, let me know  GO TO THE LAB : Get the blood work     [place], [place] back for a physical exam by 10-2021

## 2021-08-21 ENCOUNTER — Ambulatory Visit (INDEPENDENT_AMBULATORY_CARE_PROVIDER_SITE_OTHER): Payer: Medicare Other | Admitting: Pharmacist

## 2021-08-21 DIAGNOSIS — E782 Mixed hyperlipidemia: Secondary | ICD-10-CM

## 2021-08-21 DIAGNOSIS — I10 Essential (primary) hypertension: Secondary | ICD-10-CM

## 2021-08-21 DIAGNOSIS — E039 Hypothyroidism, unspecified: Secondary | ICD-10-CM

## 2021-08-21 DIAGNOSIS — Z7901 Long term (current) use of anticoagulants: Secondary | ICD-10-CM

## 2021-08-21 DIAGNOSIS — I482 Chronic atrial fibrillation, unspecified: Secondary | ICD-10-CM

## 2021-08-21 DIAGNOSIS — R739 Hyperglycemia, unspecified: Secondary | ICD-10-CM

## 2021-08-21 NOTE — Patient Instructions (Signed)
[name], It was a pleasure speaking with you today.  I have attached a summary of our visit today and information about your health goals.   Our next appointment is by telephone on April 20th, 2023 at 1pm  Please call the care guide team at [phone] if you need to cancel or reschedule your appointment.    If you have any questions or concerns, please feel free to contact me either at the phone number below or with a MyChart message.   Keep up the good work!  Cherre Robins, PharmD Clinical Pharmacist [facility] [phone] (direct line)  [phone] (main office number)  CARE PLAN ENTRY (see longitudinal plan of care for additional care plan information)  Current Barriers:  Chronic Disease Management support, education, and care coordination needs related to Hypertension, Hyperlipidemia/CAD, Pre-Diabetes AFib, Hypothyroidism, Edema   Hypertension BP Readings from Last 3 Encounters:  08/04/21 136/68  04/29/21 108/60  04/07/21 126/67   Pharmacist Clinical Goal(s): Over the next 180 days, patient will work with PharmD and providers to maintain BP goal <140/90 Current regimen:  Benazepril 20mg  1.5 tablets daily Diltiazem XR 180mg  daily Metoprolol Tartrate - take 0.5 tab twice daily Interventions: Discussed blood pressure  goal Patient self care activities - Over the next 90 days, patient will: Maintain hypertension medication regimen.  Check blood pressure 1 to 2 times per week and record for future visits  Hyperlipidemia Lab Results  Component Value Date/Time   LDLCALC 68 08/04/2021 11:31 AM   LDLCALC 96 10/31/2014 08:24 AM   Pharmacist Clinical Goal(s): Over the next 180 days, patient will work with PharmD and providers to achieve LDL goal < 70 Current regimen:  Atorvastatin 40mg  1.5 tablets daily (= 60mg  daily) Aspirin 81mg  daily Nitroglycerin 0.4mg  as needed Interventions: Discussed LDL goal Patient self care activities  - Over the next 180 days, patient will: Maintain cholesterol medication regimen.  Continue to take atorvastatin 60mg  daily  Pre-Diabetes Lab Results  Component Value Date/Time   HGBA1C 6.3 08/04/2021 11:31 AM   HGBA1C 5.9 10/25/2020 10:55 AM   Pharmacist Clinical Goal(s): Over the next 180 days, patient will work with PharmD and providers to maintain A1c goal <6.5% Current regimen:  Diet and exercise management   Patient self care activities - Over the next 180 days, patient will: Maintain a1c <6.5%  Hypothyroidism (Goal: [place] TSH WNL) Current treatment  Levothyroxine 135mcg daily Levothyroxine 72mcg daily Patient self care activities - Over the next 180 days, patient will: Take levothyroxine 30 minutes before meals or other meds on empty stomach Also reminded to avoid taking multivitamins, iron or calcium containing products within 4 hours of levothyroxine.  Medication management Pharmacist Clinical Goal(s): Over the next 180 days, patient will work with PharmD and providers to maintain optimal medication adherence Current pharmacy: [facility] Interventions Comprehensive medication review performed. Continue current medication management strategy Patient self care activities - Over the next 180 days, patient will: Focus on medication adherence by filling and taking medications appropriately  Take medications as prescribed Report any questions or concerns to PharmD and/or provider(s)  Patient verbalizes understanding of instructions provided today and agrees to view in [place].

## 2021-08-21 NOTE — Chronic Care Management (AMB) (Signed)
Chronic Care Management Pharmacy Note  08/21/2021 Name:  [name] MRN:  [id number] DOB:  Feb 23, 1936  Summary: Patient is doing well. LDL at goal since our last visit when atorvastatin dose of verified and updated to $RemoveBe'60mg'qswOfUrlH$  daily.  Recommendations/Changes made from today's visit: Continue current medications  Subjective: [name] is an 85 y.o. year old male who is a primary patient of Paz, Alda Berthold, MD.  The CCM team was consulted for assistance with disease management and care coordination needs.    Engaged with patient by telephone for follow up visit in response to provider referral for pharmacy case management and/or care coordination services.   Consent to Services:  The patient was given information about Chronic Care Management services, agreed to services, and gave verbal consent prior to initiation of services.  Please see initial visit note for detailed documentation.   Patient Care Team: Colon Branch, MD as PCP - General (Internal Medicine) Josue Hector, MD as Consulting Physician (Cardiology) Allyn Kenner, MD (Dermatology) Gaynelle Arabian, MD as Consulting Physician (Orthopedic Surgery) Newman Pies, MD as Consulting Physician (Neurosurgery) Marygrace Drought, MD as Consulting Physician (Ophthalmology) Cherre Robins, RPH-CPP (Pharmacist)  Recent office visits: 08/04/2021 - Internal Med (Dr Larose Kells) General follow up. No med changes noted.  04/07/21-Jose Ladona Horns, MD (PCP) Seen for hospital follow up. Labs ordered. Potassium was WNL and WBC normal. 02/24/2021 - PCP (Dr Larose Kells) F/U chronic conditions. TSH was noted to be elevated - no med change but to take both levothyroxine 146mcg and 57mcg daily in morning and recheck in 1 month.   Recent consult visits: 03/16/2021 - Wound Care (Dr Heber [place]) f/u wound on right lower extremity. Completed therapy. wound has healed.  03/07/2021 - Wound Care - (Dr Dellia Nims) f/u wound on right lower extremity. Apply zinc oxide wiht dressing  changes; also use moisturizing lotion AD; denatamicin under hydrofera primary dressing. Continue to use compression stocking 02/28/2021 - Wound Care (Dr Heber Tigard) no med changes.  11/18/2020 - Cardio (Dr Johnsie Cancel) F/U CAD and PAF. No med changes.   Hospital visits: 03/26/2021 to 07/07/[facility]22[facility] Admission at [facility] in [place], [place] for [place] 19 and bilateral lower extremity weakness. Found to also have low potassium. No changes in medications at discharge.   Objective:  Lab Results  Component Value Date   CREATININE 1.06 08/04/2021   CREATININE 0.98 04/07/2021   CREATININE 0.9 03/27/2021    Lab Results  Component Value Date   HGBA1C 6.3 08/04/2021   Last diabetic Eye exam:  Lab Results  Component Value Date/Time   HMDIABEYEEXA No Retinopathy 07/10/2016 12:00 AM    Last diabetic Foot exam: No results found for: HMDIABFOOTEX      Component Value Date/Time   CHOL 131 08/04/2021 1131   CHOL 161 10/31/2014 0824   TRIG 108.0 08/04/2021 1131   TRIG 81 10/31/2014 0824   TRIG 86 07/23/2006 1012   HDL 41.10 08/04/2021 1131   HDL 49 10/31/2014 0824   CHOLHDL 3 08/04/2021 1131   VLDL 21.6 08/04/2021 1131   LDLCALC 68 08/04/2021 1131   LDLCALC 96 10/31/2014 0824    Hepatic Function Latest Ref Rng & Units 08/04/2021 04/07/2021 03/27/2021  Total Protein 6.0 - 8.3 g/dL 6.4 6.3 -  Albumin 3.5 - 5.2 g/dL 4.1 3.9 -  AST 0 - 37 U/L 21 22 55(A)  ALT 0 - 53 U/L $Remo'18 19 30  'yfupY$ Alk Phosphatase 39 - 117 U/L 80 71 86  Total Bilirubin 0.2 - 1.2 mg/dL 0.5 0.7 -  Bilirubin, Direct 0.0 - 0.3 mg/dL - - -    Lab Results  Component Value Date/Time   TSH 1.99 08/04/2021 11:31 AM   TSH 4.22 03/27/2021 12:00 AM   TSH 11.41 (H) 02/24/2021 11:31 AM    CBC Latest Ref Rng & Units 04/07/2021 03/27/2021 02/24/2021  WBC 4.0 - 10.5 K/uL 7.1 6.1 7.3  Hemoglobin 13.0 - 17.0 g/dL 14.6 15.5 15.1  Hematocrit 39.0 - 52.0 % 43.7 45 44.6  Platelets 150.0 - 400.0 K/uL 317.0 170 255.0     Lab Results  Component Value Date/Time   VD25OH 31.5 03/27/2021 12:00 AM    Clinical ASCVD: Yes  The ASCVD Risk score (Arnett DK, et al., 2019) failed to calculate for the following reasons:   The 2019 ASCVD risk score is only valid for ages 24 to 35    Other: CHADS2VASc = 4  Social History   Tobacco Use  Smoking Status Former   Types: Cigarettes   Quit date: 09/21/1984   Years since quitting: 36.9  Smokeless Tobacco Never  Tobacco Comments   smoked 1958-1986, up to 1 ppd   BP Readings from Last 3 Encounters:  08/04/21 136/68  04/29/21 108/60  04/07/21 126/67   Pulse Readings from Last 3 Encounters:  08/04/21 (!) 58  04/29/21 (!) 58  04/07/21 (!) 56   Wt Readings from Last 3 Encounters:  08/04/21 194 lb 4 oz (88.1 kg)  04/29/21 194 lb 9.6 oz (88.3 kg)  04/07/21 195 lb 9.6 oz (88.7 kg)    Assessment: Review of patient past medical history, allergies, medications, health status, including review of consultants reports, laboratory and other test data, was performed as part of comprehensive evaluation and provision of chronic care management services.   SDOH:  (Social Determinants of Health) assessments and interventions performed:  SDOH Interventions    Flowsheet Row Most Recent Value  SDOH Interventions   Financial Strain Interventions Intervention Not Indicated  Transportation Interventions Intervention Not Indicated       CCM Care Plan  Allergies  Allergen Reactions   Amlodipine Besylate Other (See Comments)    REACTION: edema    Medications Reviewed Today     Reviewed by Cherre Robins, RPH-CPP (Pharmacist) on 08/21/21 at 1433  Med List Status: <None>   Medication Order Taking? Sig Documenting Provider Last Dose Status Informant  aspirin 81 MG tablet [id number] Yes Take 1 tablet (81 mg total) by mouth daily.  Patient taking differently: Take 81 mg by mouth daily. bedtime   Josue Hector, MD Taking Active   atorvastatin (LIPITOR) 40 MG tablet  [id number] Yes Take 1.5 tablets (60 mg total) by mouth at bedtime. Colon Branch, MD Taking Active   benazepril (LOTENSIN) 20 MG tablet [id number] Yes TAKE 1 AND 1/2 TABLETS(30 MG) BY MOUTH DAILY Colon Branch, MD Taking Active   DILT-XR 180 MG 24 hr capsule [id number] Yes TAKE 1 CAPSULE(180 MG) BY MOUTH DAILY Colon Branch, MD Taking Active   ELIQUIS 5 MG TABS tablet [id number] Yes TAKE 1 TABLET BY MOUTH TWICE DAILY Josue Hector, MD Taking Active   furosemide (LASIX) 40 MG tablet [id number] Yes TAKE 1 AND 1/2 TABLETS(60 MG) BY MOUTH TWICE DAILY Josue Hector, MD Taking Active   levothyroxine (SYNTHROID) 150 MCG tablet [id number] Yes TAKE 1 TABLET BY MOUTH EVERY DAY BEFORE BREAKFAST, ALONG WITH A 25MCG TABLET DAILY Colon Branch, MD Taking Active   levothyroxine (SYNTHROID)  25 MCG tablet [id number] Yes TAKE 1 TABLET BY MOUTH DAILY BEFORE BREAKFAST ALONG WITH A 150MCG TABLET DAILY Colon Branch, MD Taking Active   LUMIGAN 0.01 % SOLN [id number] Yes Place 1 drop into both eyes at bedtime. [provider] Taking Active Self           Med Note (CANTER, Vilma Prader D   Mon May 04, 2016  1:32 PM)    metoprolol tartrate (LOPRESSOR) 25 MG tablet [id number] Yes Take 0.5 tablets (12.5 mg total) by mouth 2 (two) times daily. Colon Branch, MD Taking Active   nitroGLYCERIN (NITROSTAT) 0.4 MG SL tablet [id number] Yes PLACE 1 TABLET UNDER THE TONGUE EVERY 5 MINUTES AS NEEDED FOR CHEST PAIN Josue Hector, MD Taking Active            Med Note [facility], Vilma Prader D   Mon Feb 24, 2021 11:00 AM) PRN  Timolol Maleate 0.5 % (DAILY) SOLN [id number] Yes Apply 1 drop to eye in the morning and at bedtime. [provider] Taking Active             Patient Active Problem List   Diagnosis Date Noted   Atrial fibrillation ([place]) 02/08/2019   Lumbar stenosis with neurogenic claudication 07/13/2016   Annual physical exam>>>>>>>>>>>>>>>>>> 05/04/2016   PCP NOTES>>>>>>>>>>>>>>>>>>>>>>>>>>> 12/31/2015   Edema 02/19/2015   OA  (osteoarthritis) of knee 06/25/2014   Unspecified sleep apnea 06/18/2014   Multiple thyroid nodules    Tremor    Carotid artery disease ([place])    Erectile dysfunction 05/31/2012   Coronary artery disease    Venous insufficiency    Hyperglycemia 11/09/2008   MELANOMA, HX OF 11/09/2008   COLONIC POLYPS, HYPERPLASTIC 05/10/2008   Hypothyroidism 08/10/2007   HYPERLIPIDEMIA 08/10/2007   Essential hypertension 08/10/2007   DJD (degenerative joint disease) 08/08/2007   MURMUR 08/08/2007   BENIGN PROSTATIC HYPERTROPHY, HX OF 08/05/2007    Immunization History  Administered Date(s) Administered   Fluad Quad(high Dose 65+) 08/04/2019, 10/25/2020, 08/04/2021   Influenza Split 08/12/2011, 09/06/2012   Influenza Whole 07/23/2006, 08/10/2007, 06/27/2008, 08/21/2009, 08/19/2010   Influenza, High Dose Seasonal PF 07/20/2013, 09/19/2015, 09/07/2018   Influenza,inj,Quad PF,6+ Mos 07/14/2016   Influenza-Unspecified 06/21/2014, 10/01/2017   PFIZER(Purple Top)SARS-COV-2 Vaccination 11/23/2019, 12/19/2019   Pneumococcal Conjugate-13 02/19/2015   Pneumococcal Polysaccharide-23 09/01/2013   Td 09/23/1998   Tdap 09/01/2013   Zoster, Live 09/29/2013    Conditions to be addressed/monitored: Atrial Fibrillation, CAD, HTN, HLD, and glaucoma; edema; h/o DVT; chronic venous insufficiency; hypothyroidism  Care Plan : General Pharmacy (Adult)  Updates made by Cherre Robins, RPH-CPP since 08/21/2021 12:00 AM     Problem: HTN, HLD, Hypothyroidism, Afib   Priority: High  Onset Date: 11/15/2020     Long-Range Goal: Provide education, support and care coordination for medication therapy and chronic conditions   Start Date: 11/15/2020  Recent Progress: On track  Priority: High  Note:   Current Barriers:  Unable to achieve control of TSH recently (improved) Unsure of correct atorvastatin dose (improved)  Pharmacist Clinical Goal(s):  Over the next 120 days, patient will achieve adherence to monitoring  guidelines and medication adherence to achieve therapeutic efficacy achieve control of TSH as evidenced by upcoming lab adhere to prescribed medication regimen as evidenced by fill dates contact provider office for questions/concerns as evidenced notation of same in electronic health record through collaboration with PharmD and provider.   Interventions: 1:1 collaboration with Colon Branch, MD regarding development and update of comprehensive plan of care  as evidenced by provider attestation and co-signature Inter-disciplinary care team collaboration (see longitudinal plan of care) Comprehensive medication review performed; medication list updated in electronic medical record  Hypertension (BP goal <140/90) Controlled Current treatment: Benazepril 20mg  1.5 tablets daily Diltiazem XR 180mg  daily Metoprolol Tartrate 0.5 tablet twice daily Medications previously tried: spironolactone Current home readings: 120-130 / 60's Denies hypotensive/hypertensive symptoms Interventions:  Educated on blood pressure  goals and benefits of medications for prevention of heart attack, stroke and kidney damage; Continue to monitor blood pressure  1 to 2 times per week, document, and provide log at future appointments Recommended to continue current medication  Hyperlipidemia/CAD: (LDL goal < 70) At goal since atorvastatin dose increased in early 2022 Current treatment: Atorvastatin 40mg  - take 1.5 tablets = 60mg  daily  Aspirin 81mg  daily Nitroglycerin 0.4mg  as needed Medications previously tried: none noted  Interventions:  Reviewed cholesterol goals Discussed diet and exercise Continue current therapy for heart / cholesterol  Pre-Diabetes (A1c goal <6.5%) Controlled Current medications: None Medications previously tried: none noted  Diet: eats little sweets and sugar. Most meals are meat and 2 vegetables (1 starch and 1 non starch) Exericse: none but is active at home - mows, cuts shrubs and  other yard work Intervention:  Educated onA1c and blood sugar goals; Recommended goal of 150 minutes of exercise / physical activity per week Limit serving sizes of potatoes, corn, rice and pasta to 1/2 cup  Atrial Fibrillation (Goal: prevent stroke and major bleeding) Controlled  CHADSVASc = 4 Current treatment: Rate control:  Diltiazem XR 180mg  daily Metoprolol Tartrate 1/2 tab twice daily Anticoagulation:  Eliquis 5mg  bid Medications previously tried: none noted  Interventions:  Counseled on importance of adherence to anticoagulant as prescribed Screened for Eliquis patient assistance but household income did not qualify Recommended to continue current medication  Hypothyroidism (Goal: Maintain TSH WNL) At goal Current treatment  Levothyroxine 14mcg daily Levothyroxine 73mcg daily Medications previously tried: none noted Interventions:  Educated on proper timing of medication 30 minutes before meals or other meds on empty stomach Also reminded to avoid taking multivitamin or calcium containing products within 4 hours of levothyroxine.   Patient Goals/Self-Care Activities Over the next 120 days, patient will:  take medications as prescribed check blood pressure 1 to 2 times per week, document, and provide at future appointments Report to PCP for updated labs as directed  Follow Up Plan: The care management team will reach out to the patient again over the next 120 days.        Medication Assistance:  screened for PCP for Eliquis but HH income did not qualify.  Patient's preferred pharmacy is:  [id number] [address], [address] [place] [address] Phone: [phone] Fax: [phone]   Follow Up:  Patient agrees to Care Plan and Follow-up.  Plan: Telephone follow up appointment with care management team member scheduled for:   4-5 months  Will see PCP 10/2021  Cherre Robins,  PharmD Clinical Pharmacist [facility] [phone]

## 2021-09-20 DIAGNOSIS — I10 Essential (primary) hypertension: Secondary | ICD-10-CM

## 2021-09-20 DIAGNOSIS — E039 Hypothyroidism, unspecified: Secondary | ICD-10-CM | POA: Diagnosis not present

## 2021-09-20 DIAGNOSIS — I482 Chronic atrial fibrillation, unspecified: Secondary | ICD-10-CM | POA: Diagnosis not present

## 2021-09-20 DIAGNOSIS — E782 Mixed hyperlipidemia: Secondary | ICD-10-CM

## 2021-09-29 DIAGNOSIS — L821 Other seborrheic keratosis: Secondary | ICD-10-CM | POA: Diagnosis not present

## 2021-09-29 DIAGNOSIS — D225 Melanocytic nevi of trunk: Secondary | ICD-10-CM | POA: Diagnosis not present

## 2021-09-29 DIAGNOSIS — C44519 Basal cell carcinoma of skin of other part of trunk: Secondary | ICD-10-CM | POA: Diagnosis not present

## 2021-09-29 DIAGNOSIS — L814 Other melanin hyperpigmentation: Secondary | ICD-10-CM | POA: Diagnosis not present

## 2021-09-29 DIAGNOSIS — Z08 Encounter for follow-up examination after completed treatment for malignant neoplasm: Secondary | ICD-10-CM | POA: Diagnosis not present

## 2021-09-29 DIAGNOSIS — L570 Actinic keratosis: Secondary | ICD-10-CM | POA: Diagnosis not present

## 2021-09-29 DIAGNOSIS — I872 Venous insufficiency (chronic) (peripheral): Secondary | ICD-10-CM | POA: Diagnosis not present

## 2021-09-29 DIAGNOSIS — Z85828 Personal history of other malignant neoplasm of skin: Secondary | ICD-10-CM | POA: Diagnosis not present

## 2021-09-29 DIAGNOSIS — D485 Neoplasm of uncertain behavior of skin: Secondary | ICD-10-CM | POA: Diagnosis not present

## 2021-09-29 DIAGNOSIS — L578 Other skin changes due to chronic exposure to nonionizing radiation: Secondary | ICD-10-CM | POA: Diagnosis not present

## 2021-09-30 DIAGNOSIS — H52203 Unspecified astigmatism, bilateral: Secondary | ICD-10-CM | POA: Diagnosis not present

## 2021-09-30 DIAGNOSIS — H2513 Age-related nuclear cataract, bilateral: Secondary | ICD-10-CM | POA: Diagnosis not present

## 2021-09-30 DIAGNOSIS — H401134 Primary open-angle glaucoma, bilateral, indeterminate stage: Secondary | ICD-10-CM | POA: Diagnosis not present

## 2021-09-30 DIAGNOSIS — H0100A Unspecified blepharitis right eye, upper and lower eyelids: Secondary | ICD-10-CM | POA: Diagnosis not present

## 2021-10-08 ENCOUNTER — Other Ambulatory Visit: Payer: Self-pay | Admitting: Internal Medicine

## 2021-10-09 ENCOUNTER — Other Ambulatory Visit: Payer: Self-pay

## 2021-10-09 ENCOUNTER — Telehealth: Payer: Self-pay

## 2021-10-09 MED ORDER — ATORVASTATIN CALCIUM 40 MG PO TABS: 60.0000 mg | ORAL_TABLET | Freq: Every day | ORAL | 1 refills | Status: DC

## 2021-10-09 NOTE — Telephone Encounter (Signed)
Received call from [place]- needing to verify dose of atorvastatin- per med list he should be on atorvastatin 40mg - 1.5 tabs daily. Rx sent.

## 2021-11-10 ENCOUNTER — Ambulatory Visit (INDEPENDENT_AMBULATORY_CARE_PROVIDER_SITE_OTHER): Payer: Medicare Other | Admitting: Internal Medicine

## 2021-11-10 ENCOUNTER — Encounter: Payer: Self-pay | Admitting: Internal Medicine

## 2021-11-10 VITALS — BP 134/86 | HR 47 | Temp 97.9°F | Resp 18 | Ht 69.0 in | Wt 199.2 lb

## 2021-11-10 DIAGNOSIS — Z0001 Encounter for general adult medical examination with abnormal findings: Secondary | ICD-10-CM

## 2021-11-10 DIAGNOSIS — Z23 Encounter for immunization: Secondary | ICD-10-CM | POA: Diagnosis not present

## 2021-11-10 DIAGNOSIS — E039 Hypothyroidism, unspecified: Secondary | ICD-10-CM | POA: Diagnosis not present

## 2021-11-10 DIAGNOSIS — R269 Unspecified abnormalities of gait and mobility: Secondary | ICD-10-CM

## 2021-11-10 DIAGNOSIS — I2510 Atherosclerotic heart disease of native coronary artery without angina pectoris: Secondary | ICD-10-CM

## 2021-11-10 DIAGNOSIS — Z0000 Encounter for general adult medical examination without abnormal findings: Secondary | ICD-10-CM

## 2021-11-10 DIAGNOSIS — I482 Chronic atrial fibrillation, unspecified: Secondary | ICD-10-CM

## 2021-11-10 DIAGNOSIS — R739 Hyperglycemia, unspecified: Secondary | ICD-10-CM

## 2021-11-10 DIAGNOSIS — Z9181 History of falling: Secondary | ICD-10-CM

## 2021-11-10 DIAGNOSIS — I10 Essential (primary) hypertension: Secondary | ICD-10-CM | POA: Diagnosis not present

## 2021-11-10 NOTE — Patient Instructions (Signed)
We will refer you to physical therapy to prevent falls.  Vaccines I recommend: Shingles shot (Shingrix) COVID booster   GO TO THE LAB : Get the blood work     [place], [place] back for a checkup in 6 months     "Living will", "[place] of attorney": Advanced care planning  (If you already have a living will or healthcare power of attorney, please bring the copy to be scanned in your chart.)  Advance care planning is a process that supports adults in  understanding and sharing their preferences regarding future medical care.   The patient's preferences are recorded in documents called Advance Directives.    Advanced directives are completed (and can be modified at any time) while the patient is in full mental capacity.   The documentation should be available at all times to the patient, the family and the healthcare providers.  Bring in a copy to be scanned in your chart is an excellent idea and is recommended   This legal documents direct treatment decision making and/or appoint a surrogate to make the decision if the patient is not capable to do so.    Advance directives can be documented in many types of formats,  documents have names such as:  Lliving will  Durable power of attorney for healthcare (healthcare proxy or healthcare power of attorney)  Combined directives  Physician orders for life-sustaining treatment    More information at:  [email or web address]

## 2021-11-10 NOTE — Progress Notes (Signed)
Subjective:    Patient ID: [name], male    DOB: July 21, 1936, 86 y.o.   MRN: [id number]  DOS:  11/10/2021 Type of visit - description: CPX  Since the last office visit he is doing okay. Wife reports that he is gait is different, he is shuffling his feet and is unable to pick up the legs as he did before. The patient denies any focalized or severe ache or pain, specifically no back pain.  He also denies any lower extremity paresthesias. No falls.  Review of Systems  Other than above, a 14 point review of systems is negative      Past Medical History:  Diagnosis Date   Arthritis    BPH (benign prostatic hypertrophy)    Carotid artery disease (HCC)    Doppler, December 08, 2011, 00 92% R. ICA, 11-94% LICA, followup 1 year   Coronary artery disease    DES Circumflex or CVA 2006  /  clear, February, 2011, EF 70%, no ischemia or   Ejection fraction    EF 60%, echo, 2009, mildly calcified aortic leaflets   History of colonic polyps    Hyperlipidemia    Hypertension    Hypothyroidism    Lumbar radiculopathy    Multiple thyroid nodules    Avascular echogenic areas noted in the right thyroid at the time of carotid Doppler.    PONV (postoperative nausea and vomiting)    "nausea with first hip surgery"   Skin cancer (melanoma) ([place])    PMH of    Spinal stenosis, lumbar    Tremor    Fine tremor right upper extremity   Venous insufficiency     Past Surgical History:  Procedure Laterality Date   COLONOSCOPY     CORONARY ANGIOPLASTY WITH STENT PLACEMENT  2006   x 2 stents; DES to CX and dRCA '06   KNEE SURGERY  1970's   "chipped bone"   LUMBAR LAMINECTOMY/DECOMPRESSION MICRODISCECTOMY Left 07/13/2016   Procedure: LUMBAR LAMINECTOMY AND FORAMINOTOMY Lumbar two, three, Lumbar three-four, Lumbar four-five, LEFT Lumbar five-Sacral one DISECTOMY;  Surgeon: Newman Pies, MD;  Location: [facility];  Service: Neurosurgery;  Laterality: Left;   TOE SURGERY  1997   TONSILLECTOMY      TOTAL HIP ARTHROPLASTY Left 2007   TOTAL HIP ARTHROPLASTY Right 2010   TOTAL KNEE ARTHROPLASTY Right 06/25/2014   Procedure: RIGHT TOTAL KNEE ARTHROPLASTY;  Surgeon: Gearlean Alf, MD;  Location: [facility];  Service: Orthopedics;  Laterality: Right;   Social History   Socioeconomic History   Marital status: Married    Spouse name: Not on file   Number of children: 2   Years of education: Not on file   Highest education level: Not on file  Occupational History   Occupation: retired, Nurse, learning disability  Tobacco Use   Smoking status: Former    Types: Cigarettes    Quit date: 09/21/1984    Years since quitting: 37.1   Smokeless tobacco: Never   Tobacco comments:    smoked 1958-1986, up to 1 ppd  Vaping Use   Vaping Use: Never used  Substance and Sexual Activity   Alcohol use: Yes    Comment: socially on occasion   Drug use: No   Sexual activity: Not on file  Other Topics Concern   Not on file  Social History Narrative   Household- pt and wife   2 daughters, one is a Therapist, sports @ [employer]   Social Determinants of Health  Financial Resource Strain: Low Risk    Difficulty of Paying Living Expenses: Not very hard  Food Insecurity: No Food Insecurity   Worried About Charity fundraiser in the Last Year: Never true   Ran Out of Food in the Last Year: Never true  Transportation Needs: No Transportation Needs   Lack of Transportation (Medical): No   Lack of Transportation (Non-Medical): No  Physical Activity: Inactive   Days of Exercise per Week: 0 days   Minutes of Exercise per Session: 0 min  Stress: Not on file  Social Connections: Moderately Integrated   Frequency of Communication with Friends and Family: More than three times a week   Frequency of Social Gatherings with Friends and Family: More than three times a week   Attends Religious Services: Never   Marine scientist or Organizations: Yes   Attends Music therapist: 1 to 4 times per year    Marital Status: Married  Human resources officer Violence: Not At Risk   Fear of Current or Ex-Partner: No   Emotionally Abused: No   Physically Abused: No   Sexually Abused: No     Current Outpatient Medications  Medication Instructions   aspirin 81 mg, Oral, Daily   atorvastatin (LIPITOR) 60 mg, Oral, Daily at bedtime   benazepril (LOTENSIN) 20 MG tablet TAKE 1 AND 1/2 TABLETS(30 MG) BY MOUTH DAILY   DILT-XR 180 MG 24 hr capsule TAKE 1 CAPSULE(180 MG) BY MOUTH DAILY   ELIQUIS 5 MG TABS tablet TAKE 1 TABLET BY MOUTH TWICE DAILY   furosemide (LASIX) 40 MG tablet TAKE 1 AND 1/2 TABLETS(60 MG) BY MOUTH TWICE DAILY   levothyroxine (SYNTHROID) 150 MCG tablet TAKE 1 TABLET BY MOUTH DAILY WITH BREAKFAST. TAKE IT WITH 25MCG TABLET   levothyroxine (SYNTHROID) 25 MCG tablet TAKE 1 TABLET BY MOUTH DAILY BEFORE BREAKFAST ALONG WITH A 150MCG TABLET DAILY   LUMIGAN 0.01 % SOLN 1 drop, Both Eyes, Daily at bedtime   metoprolol tartrate (LOPRESSOR) 12.5 mg, Oral, 2 times daily   nitroGLYCERIN (NITROSTAT) 0.4 MG SL tablet PLACE 1 TABLET UNDER THE TONGUE EVERY 5 MINUTES AS NEEDED FOR CHEST PAIN   Timolol Maleate 0.5 % (DAILY) SOLN 1 drop, Ophthalmic, 2 times daily       Objective:   Physical Exam BP 134/86 (BP Location: Left Arm, Patient Position: Sitting, Cuff Size: Small)    Pulse (!) 47    Temp 97.9 F (36.6 C) (Oral)    Resp 18    Ht 5\' 9"  (1.753 m)    Wt 199 lb 4 oz (90.4 kg)    SpO2 97%    BMI 29.42 kg/m  General: Well developed, NAD, BMI noted Neck: No  thyromegaly  HEENT:  Normocephalic . Face symmetric, atraumatic Lungs:  CTA B Normal respiratory effort, no intercostal retractions, no accessory muscle use. Heart: Heart sounds distant, bradycardic. Abdomen:  Not distended, soft, non-tender. No rebound or rigidity.  No bruit. Lower extremities: Some pitting edema bilaterally.  Mild. Skin: Exposed areas without rash. Not pale. Not jaundice Neurologic:  alert & oriented X3.  Speech normal,  gait: Uses a cane, no wide gait, he does have some difficulty lifting his legs thus shuffles some Strength symmetric and appropriate for age.  Psych: Cognition and judgment appear intact.  Cooperative with normal attention span and concentration.  Behavior appropriate. No anxious or depressed appearing.     Assessment      Assessment  (Transfer from  Dr. Linna Darner [phone])  Hyperglycemia, A1c 6.2 2013 HTN Hyperlipidemia Hypothyroidism  Thyroid US 2014-- no nodules, inhomogeneous  CV: --CAD: stents 2006;  low risk nuclear scan 2011, 03-2016 --A Fib dx 09/2018 at regular crads OV, on Eliquis, EF --Carotid artery disease [place] 12/2014:   Stable, 1-39% RICA stenosis. Stable, 96-43% LICA stenosis. [place] 12/2015  [place] 04/2020: 0-39% B R leg edema, [place] (-)   DVT 08-2014 and 01/2019 [facility] DJD: Dr Maureen Ralphs, back surgery Dr Arnoldo Morale 06-2016 Venous insufficiency, chronic R>L edema Tremor Glaucoma H/o skin cancer , denies Melanoma; sees derm, had a visit ~ 09/2017, Dr Nevada Crane  PLAN: Here w/ his wife for CPX.    Hyperglycemia: Check A1c. HTN: BP is very good.  He is a slightly bradycardic but denies symptoms.  Continue Lotensin, diltiazem, Lasix, metoprolol. Hypothyroidism: On Synthroid, labs. CAD, A-fib: Anticoagulated without obvious problems.  He is bradycardic but asymptomatic.  Reports he will see cardiology soon Gait disorder: Wife has noted that he is having more trouble with gait and transferring, no recent falls.  Exam is confirmatory.  We agreed on PT to increase his strength and decrease possibility of falls. RTC 6 months.  In addition to CPX, I addressed all his chronic medical problems including a new 1 (gait disorder).    This visit occurred during the SARS-CoV-2 public health emergency.  Safety protocols were in place, including screening questions prior to the visit, additional usage of staff PPE, and extensive cleaning of exam room while observing appropriate contact time as indicated for  disinfecting solutions.

## 2021-11-11 ENCOUNTER — Encounter: Payer: Self-pay | Admitting: Internal Medicine

## 2021-11-11 LAB — TSH: TSH: 3.92 u[IU]/mL (ref 0.35–5.50)

## 2021-11-11 LAB — CBC WITH DIFFERENTIAL/PLATELET
Basophils Absolute: 0.1 10*3/uL (ref 0.0–0.1)
Basophils Relative: 1.5 % (ref 0.0–3.0)
Eosinophils Absolute: 0.5 10*3/uL (ref 0.0–0.7)
Eosinophils Relative: 7.4 % — ABNORMAL HIGH (ref 0.0–5.0)
HCT: 45 % (ref 39.0–52.0)
Hemoglobin: 14.9 g/dL (ref 13.0–17.0)
Lymphocytes Relative: 20.1 % (ref 12.0–46.0)
Lymphs Abs: 1.5 10*3/uL (ref 0.7–4.0)
MCHC: 33.1 g/dL (ref 30.0–36.0)
MCV: 92 fl (ref 78.0–100.0)
Monocytes Absolute: 0.9 10*3/uL (ref 0.1–1.0)
Monocytes Relative: 12.2 % — ABNORMAL HIGH (ref 3.0–12.0)
Neutro Abs: 4.3 10*3/uL (ref 1.4–7.7)
Neutrophils Relative %: 58.8 % (ref 43.0–77.0)
Platelets: 219 10*3/uL (ref 150.0–400.0)
RBC: 4.89 Mil/uL (ref 4.22–5.81)
RDW: 15.2 % (ref 11.5–15.5)
WBC: 7.4 10*3/uL (ref 4.0–10.5)

## 2021-11-11 LAB — HEMOGLOBIN A1C: Hgb A1c MFr Bld: 6.1 % (ref 4.6–6.5)

## 2021-11-11 NOTE — Assessment & Plan Note (Signed)
-  Td 2014 - pnm 2014, Prevnar 2016; PNM 20 today - zostavax 2015 - shingrex recommended. -CoVID vaccines: Rec booster -Flu shot - UTD -Prostate/colon ca screening: No further screenings -Labs:  CBC, A1c, TSH -Diet, exercise discussed.    -Advance  Directives discussed

## 2021-11-11 NOTE — Assessment & Plan Note (Signed)
Here w/ his wife for CPX.    Hyperglycemia: Check A1c. HTN: BP is very good.  He is a slightly bradycardic but denies symptoms.  Continue Lotensin, diltiazem, Lasix, metoprolol. Hypothyroidism: On Synthroid, labs. CAD, A-fib: Anticoagulated without obvious problems.  He is bradycardic but asymptomatic.  Reports he will see cardiology soon Gait disorder: Wife has noted that he is having more trouble with gait and transferring, no recent falls.  Exam is confirmatory.  We agreed on PT to increase his strength and decrease possibility of falls. RTC 6 months.

## 2021-11-20 ENCOUNTER — Telehealth: Payer: Self-pay

## 2021-11-20 NOTE — Telephone Encounter (Signed)
Pts wife called saying she called [facility] to schedule the appointment and they said no referral was sent. Can you look into this please.  ? ?CB number is [phone] ?

## 2021-11-21 ENCOUNTER — Other Ambulatory Visit: Payer: Self-pay | Admitting: Internal Medicine

## 2021-11-21 NOTE — Telephone Encounter (Signed)
Patient wife advised to call [facility] back. ?

## 2021-11-21 NOTE — Telephone Encounter (Signed)
Spoke w/ Pt's wife Thayer Headings, informed that [facility] has confirmed that referral is there. She confirmed their number with me. She will try calling them again.  ?

## 2021-11-21 NOTE — Telephone Encounter (Signed)
Pt's wife stated they called [facility] and they told her again they do not have referral. Please advise.  ?

## 2021-12-01 DIAGNOSIS — M6281 Muscle weakness (generalized): Secondary | ICD-10-CM | POA: Diagnosis not present

## 2021-12-01 DIAGNOSIS — R262 Difficulty in walking, not elsewhere classified: Secondary | ICD-10-CM | POA: Diagnosis not present

## 2021-12-03 ENCOUNTER — Other Ambulatory Visit: Payer: Self-pay | Admitting: Internal Medicine

## 2021-12-03 ENCOUNTER — Telehealth: Payer: Self-pay

## 2021-12-03 ENCOUNTER — Other Ambulatory Visit: Payer: Self-pay | Admitting: Cardiovascular Disease

## 2021-12-03 NOTE — Telephone Encounter (Signed)
PT plan of care signed and faxed to [facility] at [phone]. Form sent for scanning.  ?

## 2021-12-03 NOTE — Telephone Encounter (Signed)
Prescription refill request for Eliquis received. ?Indication: Afib  ?Last office visit:11/18/20 Johnsie Cancel)  - Pt has schedule appt with Dr Johnsie Cancel on 01/06/22 ?Scr: 1.06 (08/04/21) ?Age: 86 ?Weight: 90.4kg ? ?Appropriate dose and refill sent to requested pharmacy.  ?

## 2021-12-08 DIAGNOSIS — C44612 Basal cell carcinoma of skin of right upper limb, including shoulder: Secondary | ICD-10-CM | POA: Diagnosis not present

## 2021-12-08 DIAGNOSIS — D485 Neoplasm of uncertain behavior of skin: Secondary | ICD-10-CM | POA: Diagnosis not present

## 2021-12-08 DIAGNOSIS — C44519 Basal cell carcinoma of skin of other part of trunk: Secondary | ICD-10-CM | POA: Diagnosis not present

## 2021-12-22 DIAGNOSIS — R262 Difficulty in walking, not elsewhere classified: Secondary | ICD-10-CM | POA: Diagnosis not present

## 2021-12-22 DIAGNOSIS — M6281 Muscle weakness (generalized): Secondary | ICD-10-CM | POA: Diagnosis not present

## 2021-12-26 DIAGNOSIS — M6281 Muscle weakness (generalized): Secondary | ICD-10-CM | POA: Diagnosis not present

## 2021-12-26 DIAGNOSIS — R262 Difficulty in walking, not elsewhere classified: Secondary | ICD-10-CM | POA: Diagnosis not present

## 2021-12-28 ENCOUNTER — Other Ambulatory Visit: Payer: Self-pay | Admitting: Cardiovascular Disease

## 2021-12-28 DIAGNOSIS — R601 Generalized edema: Secondary | ICD-10-CM

## 2021-12-29 DIAGNOSIS — M6281 Muscle weakness (generalized): Secondary | ICD-10-CM | POA: Diagnosis not present

## 2021-12-29 DIAGNOSIS — R262 Difficulty in walking, not elsewhere classified: Secondary | ICD-10-CM | POA: Diagnosis not present

## 2021-12-30 ENCOUNTER — Telehealth: Payer: Self-pay | Admitting: Internal Medicine

## 2021-12-30 DIAGNOSIS — R269 Unspecified abnormalities of gait and mobility: Secondary | ICD-10-CM

## 2021-12-30 NOTE — Progress Notes (Signed)
? ?  ? ?Date:  01/06/2022  ? ?ID:  [name], DOB Dec 12, 1935, MRN [id number] ? ? ?PCP:  Colon Branch, MD  ?Cardiologist:   Johnsie Cancel ?Electrophysiologist:  None  ? ?Evaluation Performed:  Follow-Up Visit ? ?Chief Complaint:  CAD/AFib ? ?History of Present Illness:   ? ?[name] is a 86 y.o.  male with a history of CAD s/p DES to LCx (2006), carotid artery disease, HTN, HLD who presents to clinic for follow up. No angina compliant with meds Normal nuclear study 2017 prior to back surgery. LE Edema from varicosities and previous right TKR takes lasix for this. Dr Larose Kells did venous duplex for swelling 02/07/19 and no DVT  Seen by Dr Donzetta Matters [facility] 05/19/19 and felt saphenous veins too small for intervention and would Rx conservatively with elevation, compression stockings and diuretics ?  ?10/20/18  found to be in new onset afib. He did not notice. No palpitations, mild exertional dyspnea chronic No chest pain.  ?  ?Started on Eliquis CHA2DS2-Vasc is 4  ?  ?He is asymptomatic Tolerating blood thinner  In NSR  ?TTE done 10/13/18 reviewed EF normal 55-60% severe LAE ? ?Had injections for back with Dr Arnoldo Morale good relief of pain  ? ?? Developing Parkinsons with abnormal gait and tremors Seen by Dr Larose Kells not referred to Neuro -> PT/OT Noted MRI 05/15/18 suggested possible normal pressure hydrocephalus   ? ?No cardiac concerns  ? ?Past Medical History:  ?Diagnosis Date  ? Arthritis   ? BPH (benign prostatic hypertrophy)   ? Carotid artery disease ([place])   ? Doppler, December 08, 2011, 00 76% R. ICA, 19-50% LICA, followup 1 year  ? Coronary artery disease   ? DES Circumflex or CVA 2006  /  clear, February, 2011, EF 70%, no ischemia or  ? Ejection fraction   ? EF 60%, echo, 2009, mildly calcified aortic leaflets  ? History of colonic polyps   ? Hyperlipidemia   ? Hypertension   ? Hypothyroidism   ? Lumbar radiculopathy   ? Multiple thyroid nodules   ? Avascular echogenic areas noted in the right thyroid at the time of carotid Doppler.   ?  PONV (postoperative nausea and vomiting)   ? "nausea with first hip surgery"  ? Skin cancer (melanoma) ([place])   ? PMH of   ? Spinal stenosis, lumbar   ? Tremor   ? Fine tremor right upper extremity  ? Venous insufficiency   ? ?Past Surgical History:  ?Procedure Laterality Date  ? COLONOSCOPY    ? CORONARY ANGIOPLASTY WITH STENT PLACEMENT  2006  ? x 2 stents; DES to CX and dRCA '06  ? KNEE SURGERY  1970's  ? "chipped bone"  ? LUMBAR LAMINECTOMY/DECOMPRESSION MICRODISCECTOMY Left 07/13/2016  ? Procedure: LUMBAR LAMINECTOMY AND FORAMINOTOMY Lumbar two, three, Lumbar three-four, Lumbar four-five, LEFT Lumbar five-Sacral one DISECTOMY;  Surgeon: Newman Pies, MD;  Location: [facility];  Service: Neurosurgery;  Laterality: Left;  ? TOE SURGERY  1997  ? TONSILLECTOMY    ? TOTAL HIP ARTHROPLASTY Left 2007  ? TOTAL HIP ARTHROPLASTY Right 2010  ? TOTAL KNEE ARTHROPLASTY Right 06/25/2014  ? Procedure: RIGHT TOTAL KNEE ARTHROPLASTY;  Surgeon: Gearlean Alf, MD;  Location: [facility];  Service: Orthopedics;  Laterality: Right;  ?  ? ?Current Meds  ?Medication Sig  ? apixaban (ELIQUIS) 5 MG TABS tablet TAKE 1 TABLET BY MOUTH TWICE DAILY  ? aspirin 81 MG tablet Take 1 tablet (81 mg total)  by mouth daily. (Patient taking differently: Take 81 mg by mouth daily. bedtime)  ? atorvastatin (LIPITOR) 40 MG tablet Take 1.5 tablets (60 mg total) by mouth at bedtime.  ? benazepril (LOTENSIN) 20 MG tablet TAKE 1 AND 1/2 TABLETS(30 MG) BY MOUTH DAILY  ? diltiazem (DILACOR XR) 180 MG 24 hr capsule TAKE 1 CAPSULE(180 MG) BY MOUTH DAILY  ? furosemide (LASIX) 40 MG tablet TAKE 1 AND 1/2 TABLETS(60 MG) BY MOUTH TWICE DAILY  ? levothyroxine (SYNTHROID) 150 MCG tablet TAKE 1 TABLET BY MOUTH DAILY WITH BREAKFAST. TAKE IT WITH 25MCG TABLET  ? levothyroxine (SYNTHROID) 25 MCG tablet TAKE 1 TABLET BY MOUTH EVERY DAY WITH BREAKFAST. TAKE IT WITH 150MCG  ? LUMIGAN 0.01 % SOLN Place 1 drop into both eyes at bedtime.  ? metoprolol tartrate (LOPRESSOR) 25 MG  tablet TAKE 1/2 TABLET(12.5 MG) BY MOUTH TWICE DAILY  ? nitroGLYCERIN (NITROSTAT) 0.4 MG SL tablet PLACE 1 TABLET UNDER THE TONGUE EVERY 5 MINUTES AS NEEDED FOR CHEST PAIN  ? Timolol Maleate 0.5 % (DAILY) SOLN Apply 1 drop to eye in the morning and at bedtime.  ?  ? ?Allergies:   Amlodipine besylate  ? ?Social History  ? ?Tobacco Use  ? Smoking status: Former  ?  Types: Cigarettes  ?  Quit date: 09/21/1984  ?  Years since quitting: 37.3  ? Smokeless tobacco: Never  ? Tobacco comments:  ?  smoked 1958-1986, up to 1 ppd  ?Vaping Use  ? Vaping Use: Never used  ?Substance Use Topics  ? Alcohol use: Yes  ?  Comment: socially on occasion  ? Drug use: No  ?  ? ?Family Hx: ?The patient's family history includes Colon cancer (age of onset: 49) in his father; Coronary artery disease in his mother; Diabetes in his maternal grandfather and sister; Heart attack (age of onset: 79) in his sister; Heart attack (age of onset: 56) in his maternal grandfather; Hypertension in his father; Pancreatitis in his father. There is no history of Thyroid disease, Stroke, or Prostate cancer. ? ?ROS:   ?Please see the history of present illness.    ? ?All other systems reviewed and are negative. ? ? ?Prior CV studies:   ?The following studies were reviewed today: ? ?Venous Duplex 02/07/19 ?TTE 10/13/18 ?Carotid 05/07/20  ? ? ?Labs/Other Tests and Data Reviewed:   ? ?EKG:   SR rate 61 nonspecific ST changes 12/14/19 01/06/2022 NSR rate 55 nonspecific ST changes 01/06/2022 Aflutter rate 68 no acute changes  ? ?Recent Labs: ?08/04/2021: ALT 18; BUN 22; Creatinine, Ser 1.06; Potassium 4.6; Sodium 142 ?11/10/2021: Hemoglobin 14.9; Platelets 219.0; TSH 3.92  ? ?Recent Lipid Panel ?Lab Results  ?Component Value Date/Time  ? CHOL 131 08/04/2021 11:31 AM  ? CHOL 161 10/31/2014 08:24 AM  ? TRIG 108.0 08/04/2021 11:31 AM  ? TRIG 81 10/31/2014 08:24 AM  ? TRIG 86 07/23/2006 10:12 AM  ? HDL 41.10 08/04/2021 11:31 AM  ? HDL 49 10/31/2014 08:24 AM  ? CHOLHDL 3  08/04/2021 11:31 AM  ? LDLCALC 68 08/04/2021 11:31 AM  ? LDLCALC 96 10/31/2014 08:24 AM  ? ? ?Wt Readings from Last 3 Encounters:  ?01/06/22 201 lb (91.2 kg)  ?11/10/21 199 lb 4 oz (90.4 kg)  ?08/04/21 194 lb 4 oz (88.1 kg)  ?  ? ?Objective:   ? ?Vital Signs:  BP 130/74   Pulse 76   Ht '5\' 9"'$  (1.753 m)   Wt 201 lb (91.2 kg)   SpO2 95%  BMI 29.68 kg/m?   ? ?Affect appropriate ?Healthy:  appears stated age ?HEENT: normal ?Neck supple with no adenopathy ?JVP normal no bruits no thyromegaly ?Lungs clear with no wheezing and good diaphragmatic motion ?Heart:  S1/S2 no murmur, no rub, gallop or click ?PMI normal ?Abdomen: benighn, BS positve, no tenderness, no AAA ?no bruit.  No HSM or HJR ?Distal pulses intact with no bruits ?Plus 2 RLE edema plus 1 LLE with varicosities ?Neuro intention tremor worse on left arm  ?Skin warm and dry ?Post right TKR  ?  ? ?ASSESSMENT & PLAN:   ? ?LE edema: venous reflux f/u Dr Donzetta Matters better with higher dose lasix discussed elevation, compression stocking And low sodium diet  ?  ?CAD: DES to circumflex 2006  stable with low risk Myoview 03/31/16 . Continue ASA, statin and BB ?  ?Carotid artery disease: stable doppler study in 05/07/20  Plaque no significant stenosis . Repeat due in 2 years ( 04/2022  ) ?  ?HLD: continue statin. Followed by Dr. Larose Kells LDL 68 08/04/21  ?  ?Hypothyroidism: continue synthroid followed by Dr. Larose Kells TSH 6.8 ?  ?Orho:  Post right TKR and lumbar surgery   ?  ?Afib:  New diagnosis  09/28/18 On Eliquis 5 bid for CHADVASC 4.  Continue cardizem in NSR today  ? ?Gait:  sent message to Dr Larose Kells ? Need for updated CT/MRI to r/o NPH ? ?Medication Adjustments/Labs and Tests Ordered: ?Current medicines are reviewed at length with the patient today.  Concerns regarding medicines are outlined above.  ? ?Tests Ordered: ? ?Non contrast head CT r/o NPH ? ?Medication Changes: ? ?None  ? ?Disposition:  Follow up in 6 months ? ?Signed, ?Jenkins Rouge, MD  ?01/06/2022 9:20 AM    ?[place] ?

## 2021-12-30 NOTE — Telephone Encounter (Signed)
Cardiology reached out to me, [name] gait is worsening. ?MRI brain in 2019 showed question of normal pressure hydrocephalus. ?Plan: ?Refer to neurology, please let the patient know, DX gait disorder ?

## 2021-12-30 NOTE — Telephone Encounter (Signed)
Referral placed.

## 2022-01-02 DIAGNOSIS — R262 Difficulty in walking, not elsewhere classified: Secondary | ICD-10-CM | POA: Diagnosis not present

## 2022-01-02 DIAGNOSIS — M6281 Muscle weakness (generalized): Secondary | ICD-10-CM | POA: Diagnosis not present

## 2022-01-05 ENCOUNTER — Other Ambulatory Visit: Payer: Self-pay | Admitting: Internal Medicine

## 2022-01-06 ENCOUNTER — Ambulatory Visit: Payer: Medicare Other | Admitting: Cardiovascular Disease

## 2022-01-06 ENCOUNTER — Encounter: Payer: Self-pay | Admitting: Cardiovascular Disease

## 2022-01-06 VITALS — BP 130/74 | HR 76 | Ht 69.0 in | Wt 201.0 lb

## 2022-01-06 DIAGNOSIS — G912 (Idiopathic) normal pressure hydrocephalus: Secondary | ICD-10-CM

## 2022-01-06 DIAGNOSIS — R251 Tremor, unspecified: Secondary | ICD-10-CM | POA: Diagnosis not present

## 2022-01-06 DIAGNOSIS — I482 Chronic atrial fibrillation, unspecified: Secondary | ICD-10-CM

## 2022-01-06 DIAGNOSIS — E782 Mixed hyperlipidemia: Secondary | ICD-10-CM

## 2022-01-06 DIAGNOSIS — I10 Essential (primary) hypertension: Secondary | ICD-10-CM

## 2022-01-06 NOTE — Patient Instructions (Signed)
Medication Instructions:  ?NO CHANGES  ?*If you need a refill on your cardiac medications before your next appointment, please call your pharmacy* ? ? ?Lab Work: ?NONE ?If you have labs (blood work) drawn today and your tests are completely normal, you will receive your results only by: ?MyChart Message (if you have MyChart) OR ?A paper copy in the mail ?If you have any lab test that is abnormal or we need to change your treatment, we will call you to review the results. ? ? ?Testing/Procedures: ?NON CONTRAST HEAD CT  ? ? ?Follow-Up: ?At [facility], you and your health needs are our priority.  As part of our continuing mission to provide you with exceptional heart care, we have created designated Provider Care Teams.  These Care Teams include your primary Cardiologist (physician) and Advanced Practice Providers (APPs -  Physician Assistants and Nurse Practitioners) who all work together to provide you with the care you need, when you need it. ? ?We recommend signing up for the patient portal called "MyChart".  Sign up information is provided on this After Visit Summary.  MyChart is used to connect with patients for Virtual Visits (Telemedicine).  Patients are able to view lab/test results, encounter notes, upcoming appointments, etc.  Non-urgent messages can be sent to your provider as well.   ?To learn more about what you can do with MyChart, go to [email or web address].   ? ?Your next appointment:   ?6 month(s) ? ?The format for your next appointment:   ?In Person ? ?Provider:   ?DR Johnsie Cancel ? ?If primary card or EP is not listed click here to update    :1}  ? ? ?Other Instructions ?NONE ? ?Important Information About Sugar ? ? ? ? ?  ?

## 2022-01-08 ENCOUNTER — Ambulatory Visit (INDEPENDENT_AMBULATORY_CARE_PROVIDER_SITE_OTHER): Payer: Medicare Other | Admitting: Pharmacist

## 2022-01-08 DIAGNOSIS — I2510 Atherosclerotic heart disease of native coronary artery without angina pectoris: Secondary | ICD-10-CM

## 2022-01-08 DIAGNOSIS — I10 Essential (primary) hypertension: Secondary | ICD-10-CM

## 2022-01-08 DIAGNOSIS — E782 Mixed hyperlipidemia: Secondary | ICD-10-CM

## 2022-01-08 DIAGNOSIS — I872 Venous insufficiency (chronic) (peripheral): Secondary | ICD-10-CM

## 2022-01-08 DIAGNOSIS — M7989 Other specified soft tissue disorders: Secondary | ICD-10-CM

## 2022-01-08 DIAGNOSIS — E039 Hypothyroidism, unspecified: Secondary | ICD-10-CM

## 2022-01-08 DIAGNOSIS — I482 Chronic atrial fibrillation, unspecified: Secondary | ICD-10-CM

## 2022-01-08 NOTE — Chronic Care Management (AMB) (Signed)
? ? ?Chronic Care Management ?Pharmacy Note ? ?01/08/2022 ?Name:  [name] MRN:  [id number] DOB:  12/28/1935 ? ?Summary: ?Patient is seeing Physical therapy for gait changes and will have consult with neurology 01/2022.  ?He reports today that he has only been taking furosemide 73m once each morning instead of BID due to concern that 2nd dose will cause nocturnal urination.  ? ?Recommendations/Changes made from today's visit: ?Recommended change timing of furosemide - take first dose in morning (around 8am) and change second dose of furosemide to take between noon and 3pm to avoid nighttime urination.  ? ?Subjective: ?[name] SCHARNHORSTis an 86y.o. year old male who is a primary patient of Paz, JAlda Berthold MD.  The CCM team was consulted for assistance with disease management and care coordination needs.   ? ?Engaged with patient by telephone for follow up visit in response to provider referral for pharmacy case management and/or care coordination services.  ? ?Consent to Services:  ?The patient was given information about Chronic Care Management services, agreed to services, and gave verbal consent prior to initiation of services.  Please see initial visit note for detailed documentation.  ? ?Patient Care Team: ?PColon Branch MD as PCP - General (Internal Medicine) ?NJosue Hector MD as Consulting Physician (Cardiology) ?HAllyn Kenner MD (Dermatology) ?AGaynelle Arabian MD as Consulting Physician (Orthopedic Surgery) ?JNewman Pies MD as Consulting Physician (Neurosurgery) ?TMarygrace Drought MD as Consulting Physician (Ophthalmology) ?ECherre Robins RPH-CPP (Pharmacist) ? ?Recent office visits: ?11/10/2021 - Int Med (Dr PLarose Kells CPX. Noted gait changes, shuffling feet. No med changes. Referred to PT for strengthening and gait changes. Also referred to neurology for consult regarding gait changes.  ?08/04/2021 - Internal Med (Dr PLarose Kells General follow up. No med changes noted.  ? ?Recent consult visits: ?01/06/2022 - Cardio  (Dr NJohnsie Cancel F/U afib and CAD. No med changes noted.  ?03/16/2021 - Wound Care (Dr [facility] f/u wound on right lower extremity. Completed therapy. wound has healed.   ? ?Hospital visits: ?None in last 6 months.   ? ?Objective: ? ?Lab Results  ?Component Value Date  ? CREATININE 1.06 08/04/2021  ? CREATININE 0.98 04/07/2021  ? CREATININE 0.9 03/27/2021  ? ? ?Lab Results  ?Component Value Date  ? HGBA1C 6.1 11/10/2021  ? ?Last diabetic Eye exam:  ?Lab Results  ?Component Value Date/Time  ? HMDIABEYEEXA No Retinopathy 07/10/2016 12:00 AM  ?  ?Last diabetic Foot exam: No results found for: HMDIABFOOTEX  ? ?   ?Component Value Date/Time  ? CHOL 131 08/04/2021 1131  ? CHOL 161 10/31/2014 0824  ? TRIG 108.0 08/04/2021 1131  ? TRIG 81 10/31/2014 0824  ? TRIG 86 07/23/2006 1012  ? HDL 41.10 08/04/2021 1131  ? HDL 49 10/31/2014 0824  ? CHOLHDL 3 08/04/2021 1131  ? VLDL 21.6 08/04/2021 1131  ? L[place]68 08/04/2021 1131  ? L[place]96 10/31/2014 0824  ? ? ? ?  Latest Ref Rng & Units 08/04/2021  ? 11:31 AM 04/07/2021  ? 11:48 AM 03/27/2021  ? 12:00 AM  ?Hepatic Function  ?Total Protein 6.0 - 8.3 g/dL 6.4   6.3     ?Albumin 3.5 - 5.2 g/dL 4.1   3.9     ?AST 0 - 37 U/L 21   22   55       ?ALT 0 - 53 U/L _0 ?Alk Phosphatase 39 - 117 U/L 80  71   86       ?Total Bilirubin 0.2 - 1.2 mg/dL 0.5   0.7     ?  ? This result is from an external source.  ? ? ?Lab Results  ?Component Value Date/Time  ? TSH 3.92 11/10/2021 02:56 PM  ? TSH 1.99 08/04/2021 11:31 AM  ? ? ? ?  Latest Ref Rng & Units 11/10/2021  ?  2:56 PM 04/07/2021  ? 11:48 AM 03/27/2021  ? 12:00 AM  ?CBC  ?WBC 4.0 - 10.5 K/uL 7.4   7.1   6.1       ?Hemoglobin 13.0 - 17.0 g/dL 14.9   14.6   15.5       ?Hematocrit 39.0 - 52.0 % 45.0   43.7   45       ?Platelets 150.0 - 400.0 K/uL 219.0   317.0   170       ?  ? This result is from an external source.  ? ? ?Lab Results  ?Component Value Date/Time  ? VD25OH 31.5 03/27/2021 12:00 AM  ? ? ?Clinical ASCVD: Yes  ?The ASCVD Risk  score (Arnett DK, et al., 2019) failed to calculate for the following reasons: ?  The 2019 ASCVD risk score is only valid for ages 43 to 81   ? ?Other: CHADS2VASc = 4 ? ?Social History  ? ?Tobacco Use  ?Smoking Status Former  ? Types: Cigarettes  ? Quit date: 09/21/1984  ? Years since quitting: 37.3  ?Smokeless Tobacco Never  ?Tobacco Comments  ? smoked 1958-1986, up to 1 ppd  ? ?BP Readings from Last 3 Encounters:  ?01/06/22 130/74  ?11/10/21 134/86  ?08/04/21 136/68  ? ?Pulse Readings from Last 3 Encounters:  ?01/06/22 76  ?11/10/21 (!) 47  ?08/04/21 (!) 58  ? ?Wt Readings from Last 3 Encounters:  ?01/06/22 201 lb (91.2 kg)  ?11/10/21 199 lb 4 oz (90.4 kg)  ?08/04/21 194 lb 4 oz (88.1 kg)  ? ? ?Assessment: Review of patient past medical history, allergies, medications, health status, including review of consultants reports, laboratory and other test data, was performed as part of comprehensive evaluation and provision of chronic care management services.  ? ?SDOH:  (Social Determinants of Health) assessments and interventions performed:  ? ? ? ?CCM Care Plan ? ?Allergies  ?Allergen Reactions  ? Amlodipine Besylate Other (See Comments)  ?  REACTION: edema  ? ? ?Medications Reviewed Today   ? ? Reviewed by Cherre Robins, RPH-CPP (Pharmacist) on 01/08/22 at 63  Med List Status: <None>  ? ?Medication Order Taking? Sig Documenting Provider Last Dose Status Informant  ?apixaban (ELIQUIS) 5 MG TABS tablet [id number] Yes TAKE 1 TABLET BY MOUTH TWICE DAILY Josue Hector, MD Taking Active   ?aspirin 81 MG tablet [id number] Yes Take 1 tablet (81 mg total) by mouth daily.  ?Patient taking differently: Take 81 mg by mouth daily. bedtime  ? Josue Hector, MD Taking Active   ?atorvastatin (LIPITOR) 40 MG tablet [id number] Yes Take 1.5 tablets (60 mg total) by mouth at bedtime. Colon Branch, MD Taking Active   ?benazepril (LOTENSIN) 20 MG tablet [id number] Yes TAKE 1 AND 1/2 TABLETS(30 MG) BY MOUTH DAILY Colon Branch, MD Taking  Active   ?diltiazem (DILACOR XR) 180 MG 24 hr capsule [id number] Yes TAKE 1 CAPSULE(180 MG) BY MOUTH DAILY Colon Branch, MD Taking Active   ?furosemide (LASIX) 40 MG tablet [id number] Yes TAKE 1 AND 1/2 TABLETS(60 MG) BY MOUTH TWICE  DAILY  ?Patient taking differently: Take 60 mg by mouth daily.  ? Josue Hector, MD Taking Active   ?levothyroxine (SYNTHROID) 150 MCG tablet [id number] Yes TAKE 1 TABLET BY MOUTH DAILY WITH BREAKFAST. TAKE IT WITH 25MCG TABLET Colon Branch, MD Taking Active   ?levothyroxine (SYNTHROID) 25 MCG tablet [id number] Yes TAKE 1 TABLET BY MOUTH EVERY DAY WITH BREAKFAST. TAKE IT WITH 150MCG Colon Branch, MD Taking Active   ?LUMIGAN 0.01 % SOLN [id number] Yes Place 1 drop into both eyes at bedtime. [provider] Taking Active Self  ?         ?Med Note [employer], [place] May 04, 2016  1:32 PM)    ?metoprolol tartrate (LOPRESSOR) 25 MG tablet [id number] Yes TAKE 1/2 TABLET(12.5 MG) BY MOUTH TWICE DAILY Colon Branch, MD Taking Active   ?nitroGLYCERIN (NITROSTAT) 0.4 MG SL tablet [id number] Yes PLACE 1 TABLET UNDER THE TONGUE EVERY 5 MINUTES AS NEEDED FOR CHEST PAIN Josue Hector, MD Taking Active   ?         ?Med Note (CANTER, Cheri Rous   Mon Feb 24, 2021 11:00 AM) PRN  ?Timolol Maleate 0.5 % (DAILY) SOLN [id number] Yes Apply 1 drop to eye in the morning and at bedtime. [provider] Taking Active   ? ?  ?  ? ?  ? ? ?Patient Active Problem List  ? Diagnosis Date Noted  ? Atrial fibrillation ([place]) 02/08/2019  ? Lumbar stenosis with neurogenic claudication 07/13/2016  ? Annual physical exam>>>>>>>>>>>>>>>>>> 05/04/2016  ? PCP NOTES>>>>>>>>>>>>>>>>>>>>>>>>>>> 12/31/2015  ? Edema 02/19/2015  ? OA (osteoarthritis) of knee 06/25/2014  ? Unspecified sleep apnea 06/18/2014  ? Multiple thyroid nodules   ? Tremor   ? Carotid artery disease ([place])   ? Erectile dysfunction 05/31/2012  ? Coronary artery disease   ? Venous insufficiency   ? Hyperglycemia 11/09/2008  ? MELANOMA, HX OF 11/09/2008  ?  COLONIC POLYPS, HYPERPLASTIC 05/10/2008  ? Hypothyroidism 08/10/2007  ? HYPERLIPIDEMIA 08/10/2007  ? Essential hypertension 08/10/2007  ? DJD (degenerative joint disease) 08/08/2007  ? MURMUR 08/08/19

## 2022-01-08 NOTE — Patient Instructions (Signed)
[name], ?It was a pleasure speaking with you  ?Below is a summary of your health goals and care plan ? ?Patient Goals/Self-Care Activities ?take medications as prescribed ?check blood pressure 1 to 2 times per week, document, and provide at future appointments ?Continue to go to physical therapy for strength building and use cane to prevent falls ?Continue to check weight daily and monitor swelling in legs. Remember to take 2nd dose of furosemide earlier in the day - between noon and 3pm.  ? ?If you have any questions or concerns, please feel free to contact me either at the phone number below or with a MyChart message.  ? ?Keep up the good work! ? ?Cherre Robins, PharmD ?Clinical Pharmacist ?[facility] ?[place] ?[phone] (direct line)  ?[phone] (main office number) ? ? ?Chronic Care Management Care Plan ?Hypertension / Swelling in legs / feet ?BP Readings from Last 3 Encounters:  ?01/06/22 130/74  ?11/10/21 134/86  ?08/04/21 136/68  ? ?Pharmacist Clinical Goal(s): ?Over the next 180 days, patient will work with PharmD and providers to maintain BP goal <140/90 ?Current regimen:  ?Benazepril '20mg'$  1.5 tablets daily ?Diltiazem XR '180mg'$  daily ?Metoprolol Tartrate - take 0.5 tab twice daily ?Furosemide '40mg'$  tablets - take 1.5 tablets = '60mg'$  twice a day ?Interventions: ?Discussed blood pressure  goal ?Patient self care activities - Over the next 90 days, patient will: ?Maintain hypertension medication regimen.  ?Check blood pressure 1 to 2 times per week and record for future visits ?Continue to check weight daily and monitor swelling in legs. Remember to take 2nd dose of furosemide earlier in the day - between noon and 3pm. ? ?Hyperlipidemia ?Lab Results  ?Component Value Date/Time  ? [place] 68 08/04/2021 11:31 AM  ? LDLCALC 96 10/31/2014 08:24 AM  ? ?Pharmacist Clinical Goal(s): ?Over the next 180 days, patient will work with PharmD and providers to achieve LDL goal < 70 ?Current  regimen:  ?Atorvastatin '40mg'$  1.5 tablets daily (= '60mg'$  daily) ?Aspirin '81mg'$  daily ?Nitroglycerin 0.'4mg'$  as needed ?Interventions: ?Discussed LDL goal ?Patient self care activities - Over the next 180 days, patient will: ?Maintain cholesterol medication regimen.  ?Continue to take atorvastatin '60mg'$  daily ? ?Pre-Diabetes ?Lab Results  ?Component Value Date/Time  ? HGBA1C 6.1 11/10/2021 02:56 PM  ? HGBA1C 6.3 08/04/2021 11:31 AM  ? ?Pharmacist Clinical Goal(s): ?Over the next 180 days, patient will work with PharmD and providers to maintain A1c goal <6.5% ?Current regimen:  ?Diet and exercise management   ?Patient self care activities - Over the next 180 days, patient will: ?Maintain a1c <6.5% ? ?Hypothyroidism (Goal: [place] thyroid test in normal range) ?Current treatment  ?Levothyroxine 190mg daily ?Levothyroxine 246m daily ?Patient self care activities - Over the next 180 days, patient will: ?Take levothyroxine 30 minutes before meals or other meds on empty stomach ?Also reminded to avoid taking multivitamins, iron or calcium containing products within 4 hours of levothyroxine. ? ?Medication management ?Pharmacist Clinical Goal(s): ?Over the next 180 days, patient will work with PharmD and providers to maintain optimal medication adherence ?Current pharmacy: [facility] ?Interventions ?Comprehensive medication review performed. ?Continue current medication management strategy ?Patient self care activities - Over the next 180 days, patient will: ?Focus on medication adherence by filling and taking medications appropriately  ?Take medications as prescribed ?Report any questions or concerns to PharmD and/or provider(s) ? ? ? ? ?Follow Up Plan: The care management team will reach out to the patient again over the next 120 days.  ? ?The  patient verbalized understanding of instructions, educational materials, and care plan provided today and agreed to receive a mailed copy of patient instructions, educational materials,  and care plan.   ?

## 2022-01-09 ENCOUNTER — Telehealth: Payer: Self-pay | Admitting: Internal Medicine

## 2022-01-09 DIAGNOSIS — M6281 Muscle weakness (generalized): Secondary | ICD-10-CM | POA: Diagnosis not present

## 2022-01-09 DIAGNOSIS — R262 Difficulty in walking, not elsewhere classified: Secondary | ICD-10-CM | POA: Diagnosis not present

## 2022-01-09 MED ORDER — LEVOTHYROXINE SODIUM 175 MCG PO TABS: 175.0000 ug | ORAL_TABLET | Freq: Every day | ORAL | 1 refills | Status: DC

## 2022-01-09 NOTE — Telephone Encounter (Signed)
[facility] rep called on pt's behalf stating that he is having issues with taking the two different strengths of levothyroxine. Pt was wondering if there was any way to have one pill to take for that medication vs. the two he has been taking. Please Advise.  ?

## 2022-01-09 NOTE — Telephone Encounter (Signed)
Forward to our clinical pharmacist to see if any changes needed. ?

## 2022-01-09 NOTE — Telephone Encounter (Signed)
Certainly can change levothyroxin 131mg + 237m 1 each daily to take 17567mstrength once a day.  ?Updated prescription sent to pharmacy. Patient and his wife were made aware of dose change and endorsed understanding that he would now take 175m31m 1 tablet daily. ? ?

## 2022-01-12 DIAGNOSIS — C44529 Squamous cell carcinoma of skin of other part of trunk: Secondary | ICD-10-CM | POA: Diagnosis not present

## 2022-01-12 DIAGNOSIS — D485 Neoplasm of uncertain behavior of skin: Secondary | ICD-10-CM | POA: Diagnosis not present

## 2022-01-12 DIAGNOSIS — C44519 Basal cell carcinoma of skin of other part of trunk: Secondary | ICD-10-CM | POA: Diagnosis not present

## 2022-01-13 DIAGNOSIS — M6281 Muscle weakness (generalized): Secondary | ICD-10-CM | POA: Diagnosis not present

## 2022-01-13 DIAGNOSIS — R262 Difficulty in walking, not elsewhere classified: Secondary | ICD-10-CM | POA: Diagnosis not present

## 2022-01-16 ENCOUNTER — Ambulatory Visit (INDEPENDENT_AMBULATORY_CARE_PROVIDER_SITE_OTHER)
Admission: RE | Admit: 2022-01-16 | Discharge: 2022-01-16 | Disposition: A | Discharge status: Home / Self Care | Payer: Medicare Other | Source: Ambulatory Visit | Attending: Cardiovascular Disease | Admitting: Cardiovascular Disease

## 2022-01-16 DIAGNOSIS — G912 (Idiopathic) normal pressure hydrocephalus: Secondary | ICD-10-CM | POA: Diagnosis not present

## 2022-01-16 DIAGNOSIS — R2689 Other abnormalities of gait and mobility: Secondary | ICD-10-CM | POA: Diagnosis not present

## 2022-01-18 ENCOUNTER — Telehealth: Payer: Self-pay | Admitting: Internal Medicine

## 2022-01-18 DIAGNOSIS — I10 Essential (primary) hypertension: Secondary | ICD-10-CM

## 2022-01-18 DIAGNOSIS — I2510 Atherosclerotic heart disease of native coronary artery without angina pectoris: Secondary | ICD-10-CM

## 2022-01-18 DIAGNOSIS — E039 Hypothyroidism, unspecified: Secondary | ICD-10-CM | POA: Diagnosis not present

## 2022-01-18 DIAGNOSIS — I482 Chronic atrial fibrillation, unspecified: Secondary | ICD-10-CM | POA: Diagnosis not present

## 2022-01-18 DIAGNOSIS — E782 Mixed hyperlipidemia: Secondary | ICD-10-CM

## 2022-01-18 DIAGNOSIS — G912 (Idiopathic) normal pressure hydrocephalus: Secondary | ICD-10-CM

## 2022-01-18 NOTE — Telephone Encounter (Signed)
Cardiology Rx CT head, see results below. ?Please arrange neurology referral. ?Dx normal pressure hydrocephalus. ?Make patient aware ? ? ?1. Redemonstrated ventriculomegaly with crowding of the sulci near ?the vertex, as can be seen in normal pressure hydrocephalus, a ?clinical diagnosis. Correlate with symptoms and consider CSF tap ?test, if clinically indicated. ?2.  No acute intracranial process. ?  ?

## 2022-01-19 NOTE — Telephone Encounter (Signed)
Referral placed.

## 2022-01-20 DIAGNOSIS — M6281 Muscle weakness (generalized): Secondary | ICD-10-CM | POA: Diagnosis not present

## 2022-01-20 DIAGNOSIS — R262 Difficulty in walking, not elsewhere classified: Secondary | ICD-10-CM | POA: Diagnosis not present

## 2022-01-27 DIAGNOSIS — L821 Other seborrheic keratosis: Secondary | ICD-10-CM | POA: Diagnosis not present

## 2022-01-27 DIAGNOSIS — D485 Neoplasm of uncertain behavior of skin: Secondary | ICD-10-CM | POA: Diagnosis not present

## 2022-01-27 DIAGNOSIS — L989 Disorder of the skin and subcutaneous tissue, unspecified: Secondary | ICD-10-CM | POA: Diagnosis not present

## 2022-01-27 DIAGNOSIS — C44729 Squamous cell carcinoma of skin of left lower limb, including hip: Secondary | ICD-10-CM | POA: Diagnosis not present

## 2022-01-27 DIAGNOSIS — L570 Actinic keratosis: Secondary | ICD-10-CM | POA: Diagnosis not present

## 2022-01-27 DIAGNOSIS — C44329 Squamous cell carcinoma of skin of other parts of face: Secondary | ICD-10-CM | POA: Diagnosis not present

## 2022-01-29 DIAGNOSIS — M6281 Muscle weakness (generalized): Secondary | ICD-10-CM | POA: Diagnosis not present

## 2022-01-29 DIAGNOSIS — R262 Difficulty in walking, not elsewhere classified: Secondary | ICD-10-CM | POA: Diagnosis not present

## 2022-02-02 DIAGNOSIS — R262 Difficulty in walking, not elsewhere classified: Secondary | ICD-10-CM | POA: Diagnosis not present

## 2022-02-02 DIAGNOSIS — M6281 Muscle weakness (generalized): Secondary | ICD-10-CM | POA: Diagnosis not present

## 2022-02-05 ENCOUNTER — Encounter: Payer: Self-pay | Admitting: Neurology

## 2022-02-05 ENCOUNTER — Ambulatory Visit: Payer: Medicare Other | Admitting: Neurology

## 2022-02-05 VITALS — BP 145/73 | HR 74 | Ht 69.0 in | Wt 201.5 lb

## 2022-02-05 DIAGNOSIS — R262 Difficulty in walking, not elsewhere classified: Secondary | ICD-10-CM | POA: Diagnosis not present

## 2022-02-05 DIAGNOSIS — Z7901 Long term (current) use of anticoagulants: Secondary | ICD-10-CM | POA: Diagnosis not present

## 2022-02-05 DIAGNOSIS — M6281 Muscle weakness (generalized): Secondary | ICD-10-CM | POA: Diagnosis not present

## 2022-02-05 DIAGNOSIS — R269 Unspecified abnormalities of gait and mobility: Secondary | ICD-10-CM | POA: Diagnosis not present

## 2022-02-05 DIAGNOSIS — G250 Essential tremor: Secondary | ICD-10-CM | POA: Diagnosis not present

## 2022-02-05 DIAGNOSIS — G910 Communicating hydrocephalus: Secondary | ICD-10-CM | POA: Diagnosis not present

## 2022-02-05 NOTE — Progress Notes (Signed)
[facility]  PATIENT: DORA CLAUSS DOB: 02/16/1936  REFERRING DOCTOR OR PCP: Belinda Fisher, MD SOURCE: Patient, notes from primary care, imaging and lab results, CT scan images personally reviewed.  _________________________________   HISTORICAL  CHIEF COMPLAINT:  Chief Complaint  Patient presents with   New Patient (Initial Visit)    Rm 2, w wife Thayer Headings. Pt referred for gait disorder. Ambulates w cane. Had recent CT scan. In PT for the last 3 weeks.     HISTORY OF PRESENT ILLNESS:  I had the pleasure of seeing patient, Kaiser Belluomini, at [facility] for neurologic consultation regarding his gait disorder.  He is an 86 year old man who has had progressive gait disturbance over the last year.   He started to use a cane in early 2023.    His stride has reduced in length.   He has no falls.   He has had THR and TKR but these procedures were many years ago and orthopedics feels that there are no new joint issues.    He has pedal edema and wears stockings.   He denies any issues with his arms though his wife notes he has a fine tremor at times.  That tremor is noted mostly with intention, especially writing.   He denies numbness in his legs.    He has had urinary frequency since starting a diuretic.    He only has nocturia once at night.    He has started PT.     He notes no change in cognition and no change in swallowing.    He sleeps well and has no REM behavior disorder.      CT head 01/16/2022 showed ventriculomegaly out of proportion to the extent of atrophy.   Sulci are effaced at the vertex.  The ventriculomegaly has mildly progressed compared to the 2019 MRI.  MRI of the head 05/14/2018 showed ventriculomegaly out of proportion to the extent of atrophy.  There was only minimal chronic microvascular ischemic change.  He is on Eliquis for A-fib.  No recent medication changes.   REVIEW OF SYSTEMS: Constitutional: No fevers, chills, sweats, or  change in appetite Eyes: No visual changes, double vision, eye pain Ear, nose and throat: No hearing loss, ear pain, nasal congestion, sore throat Cardiovascular: No chest pain, palpitations Respiratory:  No shortness of breath at rest or with exertion.   No wheezes GastrointestinaI: No nausea, vomiting, diarrhea, abdominal pain, fecal incontinence Genitourinary:  No dysuria, urinary retention or frequency.  No nocturia. Musculoskeletal:  No neck pain, back pain Integumentary: No rash, pruritus, skin lesions Neurological: as above Psychiatric: No depression at this time.  No anxiety Endocrine: No palpitations, diaphoresis, change in appetite, change in weigh or increased thirst Hematologic/Lymphatic:  No anemia, purpura, petechiae. Allergic/Immunologic: No itchy/runny eyes, nasal congestion, recent allergic reactions, rashes  ALLERGIES: Allergies  Allergen Reactions   Amlodipine Besylate Other (See Comments)    REACTION: edema    HOME MEDICATIONS:  Current Outpatient Medications:    apixaban (ELIQUIS) 5 MG TABS tablet, TAKE 1 TABLET BY MOUTH TWICE DAILY, Disp: 180 tablet, Rfl: 0   aspirin 81 MG tablet, Take 1 tablet (81 mg total) by mouth daily. (Patient taking differently: Take 81 mg by mouth daily. bedtime), Disp: , Rfl:    atorvastatin (LIPITOR) 40 MG tablet, Take 1.5 tablets (60 mg total) by mouth at bedtime., Disp: 135 tablet, Rfl: 1   benazepril (LOTENSIN) 20 MG tablet, TAKE 1 AND 1/2 TABLETS(30 MG)  BY MOUTH DAILY, Disp: 135 tablet, Rfl: 1   diltiazem (DILACOR XR) 180 MG 24 hr capsule, TAKE 1 CAPSULE(180 MG) BY MOUTH DAILY, Disp: 90 capsule, Rfl: 1   furosemide (LASIX) 40 MG tablet, TAKE 1 AND 1/2 TABLETS(60 MG) BY MOUTH TWICE DAILY (Patient taking differently: Take 60 mg by mouth daily.), Disp: 90 tablet, Rfl: 0   levothyroxine (SYNTHROID) 175 MCG tablet, Take 1 tablet (175 mcg total) by mouth daily., Disp: 90 tablet, Rfl: 1   LUMIGAN 0.01 % SOLN, Place 1 drop into both eyes at  bedtime., Disp: , Rfl:    metoprolol tartrate (LOPRESSOR) 25 MG tablet, TAKE 1/2 TABLET(12.5 MG) BY MOUTH TWICE DAILY, Disp: 90 tablet, Rfl: 1   nitroGLYCERIN (NITROSTAT) 0.4 MG SL tablet, PLACE 1 TABLET UNDER THE TONGUE EVERY 5 MINUTES AS NEEDED FOR CHEST PAIN, Disp: 25 tablet, Rfl: 6   Timolol Maleate 0.5 % (DAILY) SOLN, Apply 1 drop to eye in the morning and at bedtime., Disp: , Rfl:   PAST MEDICAL HISTORY: Past Medical History:  Diagnosis Date   Arthritis    BPH (benign prostatic hypertrophy)    Carotid artery disease ([place])    Doppler, December 08, 2011, 00 14% R. ICA, 78-29% LICA, followup 1 year   Coronary artery disease    DES Circumflex or CVA 2006  /  clear, February, 2011, EF 70%, no ischemia or   Ejection fraction    EF 60%, echo, 2009, mildly calcified aortic leaflets   History of colonic polyps    Hyperlipidemia    Hypertension    Hypothyroidism    Lumbar radiculopathy    Multiple thyroid nodules    Avascular echogenic areas noted in the right thyroid at the time of carotid Doppler.    PONV (postoperative nausea and vomiting)    "nausea with first hip surgery"   Skin cancer (melanoma) ([place])    PMH of    Spinal stenosis, lumbar    Tremor    Fine tremor right upper extremity   Venous insufficiency     PAST SURGICAL HISTORY: Past Surgical History:  Procedure Laterality Date   COLONOSCOPY     CORONARY ANGIOPLASTY WITH STENT PLACEMENT  2006   x 2 stents; DES to CX and dRCA '06   KNEE SURGERY  1970's   "chipped bone"   LUMBAR LAMINECTOMY/DECOMPRESSION MICRODISCECTOMY Left 07/13/2016   Procedure: LUMBAR LAMINECTOMY AND FORAMINOTOMY Lumbar two, three, Lumbar three-four, Lumbar four-five, LEFT Lumbar five-Sacral one DISECTOMY;  Surgeon: Newman Pies, MD;  Location: [place];  Service: Neurosurgery;  Laterality: Left;   TOE SURGERY  1997   TONSILLECTOMY     TOTAL HIP ARTHROPLASTY Left 2007   TOTAL HIP ARTHROPLASTY Right 2010   TOTAL KNEE ARTHROPLASTY Right 06/25/2014    Procedure: RIGHT TOTAL KNEE ARTHROPLASTY;  Surgeon: Gearlean Alf, MD;  Location: [facility];  Service: Orthopedics;  Laterality: Right;    FAMILY HISTORY: Family History  Problem Relation Age of Onset   Coronary artery disease Mother        carotid endarterectomy bilaterally   Colon cancer Father 26   Hypertension Father    Pancreatitis Father    Diabetes Sister    Diabetes Maternal Grandfather    Heart attack Maternal Grandfather 9   Heart attack Sister 30   Thyroid disease Neg Hx    Stroke Neg Hx    Prostate cancer Neg Hx     SOCIAL HISTORY:  Social History   Socioeconomic History   Marital status:  Married    Spouse name: Thayer Headings   Number of children: 2   Years of education: Not on file   Highest education level: Not on file  Occupational History   Occupation: retired, Nurse, learning disability  Tobacco Use   Smoking status: Former    Types: Cigarettes    Quit date: 09/21/1984    Years since quitting: 37.4   Smokeless tobacco: Never   Tobacco comments:    smoked 1958-1986, up to 1 ppd  Vaping Use   Vaping Use: Never used  Substance and Sexual Activity   Alcohol use: Yes    Comment: socially on occasion   Drug use: No   Sexual activity: Not on file  Other Topics Concern   Not on file  Social History Narrative   Household- pt and wife   2 daughters, one is a Therapist, sports @ [employer]   R handed   Caffeine: 3 C of coffee a day   Social Determinants of Radio broadcast assistant Strain: Low Risk    Difficulty of Paying Living Expenses: Not very hard  Food Insecurity: No Food Insecurity   Worried About Charity fundraiser in the Last Year: Never true   [place] in the Last Year: Never true  Transportation Needs: No Transportation Needs   Lack of Transportation (Medical): No   Lack of Transportation (Non-Medical): No  Physical Activity: Inactive   Days of Exercise per Week: 0 days   Minutes of Exercise per Session: 0 min  Stress: Not on file  Social  Connections: Moderately Integrated   Frequency of Communication with Friends and Family: More than three times a week   Frequency of Social Gatherings with Friends and Family: More than three times a week   Attends Religious Services: Never   Marine scientist or Organizations: Yes   Attends Archivist Meetings: 1 to 4 times per year   Marital Status: Married  Human resources officer Violence: Not At Risk   Fear of Current or Ex-Partner: No   Emotionally Abused: No   Physically Abused: No   Sexually Abused: No     PHYSICAL EXAM  Vitals:   02/05/22 1451  BP: (!) 145/73  Pulse: 74  Weight: 201 lb 8 oz (91.4 kg)  Height: '5\' 9"'$  (1.753 m)    Body mass index is 29.76 kg/m.   General: The patient is well-developed and well-nourished and in no acute distress  HEENT:  Head is [place]/AT.  Sclera are anicteric.  Funduscopic exam shows normal optic discs and retinal vessels.  Neck: No carotid bruits are noted.  The neck is nontender.  Cardiovascular: The heart has a regular rate and rhythm with a normal S1 and S2. There were no murmurs, gallops or rubs.    Skin: Extremities are without rash or  edema.  Musculoskeletal:  Back is nontender  Neurologic Exam  Mental status: The patient is alert and oriented x 3 at the time of the examination. The patient has apparent normal recent and remote memory, with an apparently normal attention span and concentration ability.   Speech is normal.  Cranial nerves: He has reduced upgaze.. Pupils are equal, round, and reactive to light and accomodation.  Visual fields are full.  Facial symmetry is present. There is good facial sensation to soft touch bilaterally.Facial strength is normal.  Trapezius and sternocleidomastoid strength is normal. No dysarthria is noted.  The tongue is midline, and the patient has symmetric elevation of  the soft palate. No obvious hearing deficits are noted.  Motor: He has a low amplitude 6 to 7 Hz tremor in the  hands.  Muscle bulk is normal.   Tone is normal. Strength is  5 / 5 in all 4 extremities.   Sensory: Sensory testing is intact to pinprick, soft touch and vibration sensation in all 4 extremities.  Coordination: Cerebellar testing reveals good finger-nose-finger and heel-to-shin bilaterally.  Gait and station: Station is normal.   Gait is shuffling with reduced stride and a 6 step 180 degree turn.  Cannot tandem.  He has retropulsion Romberg is negative.   Reflexes: Deep tendon reflexes are symmetric and normal bilaterally.   Plantar responses are flexor.    DIAGNOSTIC DATA (LABS, IMAGING, TESTING) - I reviewed patient records, labs, notes, testing and imaging myself where available.  Lab Results  Component Value Date   WBC 7.4 11/10/2021   HGB 14.9 11/10/2021   HCT 45.0 11/10/2021   MCV 92.0 11/10/2021   PLT 219.0 11/10/2021      Component Value Date/Time   NA 142 08/04/2021 1131   NA 139 03/27/2021 0000   K 4.6 08/04/2021 1131   CL 106 08/04/2021 1131   CO2 30 08/04/2021 1131   GLUCOSE 78 08/04/2021 1131   BUN 22 08/04/2021 1131   BUN 22 (A) 03/27/2021 0000   CREATININE 1.06 08/04/2021 1131   CREATININE 1.11 09/02/2016 1019   CALCIUM 9.5 08/04/2021 1131   PROT 6.4 08/04/2021 1131   ALBUMIN 4.1 08/04/2021 1131   AST 21 08/04/2021 1131   ALT 18 08/04/2021 1131   ALKPHOS 80 08/04/2021 1131   BILITOT 0.5 08/04/2021 1131   GFRNONAA 85 03/27/2021 0000   GFRAA 70 04/27/2019 1052   Lab Results  Component Value Date   CHOL 131 08/04/2021   HDL 41.10 08/04/2021   LDLCALC 68 08/04/2021   TRIG 108.0 08/04/2021   CHOLHDL 3 08/04/2021   Lab Results  Component Value Date   HGBA1C 6.1 11/10/2021   Lab Results  Component Value Date   VITAMINB12 409 03/27/2021   Lab Results  Component Value Date   TSH 3.92 11/10/2021       ASSESSMENT AND PLAN  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[place]) - Plan: DG FL GUIDED LUMBAR PUNCTURE  Gait disturbance - Plan: DG FL GUIDED LUMBAR  PUNCTURE  Benign essential tremor  Chronic anticoagulation   In summary, Mr. Zhen is an 86 year old man with progressive gait disturbance over the last year.  He has a reduced stride and takes 6 steps to turn 180 degrees.  His CT scan and previous MRI scan are worrisome for normal pressure hydrocephalus.  There is ventriculomegaly out of proportion to the extent of atrophy and sulci are effaced at the vertex.  We discussed having him do a high-volume lumbar puncture to determine if improvement is noted over the next day or 2.  If so, I would recommend referral to neurosurgery for a VP or similar shunt.  He is on Eliquis and this will need to be held for his lumbar puncture.  Follow-up will be arranged after his lumbar puncture depending on results.  If he gets a benefit I will refer him to neurosurgery.  If no benefit, I would consider obtaining an MRI of the cervical spine to make sure that myelopathy is not playing a role in his symptoms.  Thank you for asking me to see Mr. Noyes.  Please let me know if I can be of further assistance with  him or other patients in the future.   Shamicka Inga A. Felecia Shelling, MD, [facility] 9/77/4142, 3:95 PM Certified in Neurology, Clinical Neurophysiology, Sleep Medicine and Neuroimaging  [facility] [address], [place], [place] [address] [phone]

## 2022-02-09 ENCOUNTER — Other Ambulatory Visit: Payer: Self-pay | Admitting: Neurology

## 2022-02-09 ENCOUNTER — Telehealth: Payer: Self-pay | Admitting: Neurology

## 2022-02-09 DIAGNOSIS — M6281 Muscle weakness (generalized): Secondary | ICD-10-CM | POA: Diagnosis not present

## 2022-02-09 DIAGNOSIS — R262 Difficulty in walking, not elsewhere classified: Secondary | ICD-10-CM | POA: Diagnosis not present

## 2022-02-09 DIAGNOSIS — G910 Communicating hydrocephalus: Secondary | ICD-10-CM

## 2022-02-09 NOTE — Telephone Encounter (Signed)
Called and spoke w/ wife, Thayer Headings. Informed her insurance requires he get MRI brain first before LP. Aware MRI order placed by Dr. Felecia Shelling. Once approved via insurance, they will be called to schedule. She verbalized understanding.

## 2022-02-09 NOTE — Telephone Encounter (Signed)
I messaged [name] with [employer] about getting patient scheduled for the Lumbar Puncture. Patient will need to have an MRI first before the LP.

## 2022-02-10 ENCOUNTER — Telehealth: Payer: Self-pay | Admitting: Neurology

## 2022-02-10 NOTE — Telephone Encounter (Signed)
[facility] medicare sent to GI they will call the patient to schedule

## 2022-02-15 ENCOUNTER — Ambulatory Visit
Admission: RE | Admit: 2022-02-15 | Discharge: 2022-02-15 | Disposition: A | Discharge status: Home / Self Care | Payer: Medicare Other | Source: Ambulatory Visit | Attending: Neurology | Admitting: Neurology

## 2022-02-15 DIAGNOSIS — G910 Communicating hydrocephalus: Secondary | ICD-10-CM | POA: Diagnosis not present

## 2022-02-17 ENCOUNTER — Telehealth: Payer: Self-pay | Admitting: *Deleted

## 2022-02-17 NOTE — Telephone Encounter (Signed)
-----   Message from Britt Bottom, MD sent at 02/17/2022  4:59 PM EDT ----- After the last visit, based on the head CT, I was concerned about normal pressure hydrocephalus and ordered a high-volume lumbar puncture.  Radiology had wanted an MRI to be performed first.  The MRI confirms the findings on the CT scan (large ventricles worrisome for normal pressure hydrocephalus).  Therefore, I would like him to get the high-volume lumbar puncture.

## 2022-02-17 NOTE — Telephone Encounter (Signed)
Called and spoke with pt wife. Relayed results per Dr. Garth Bigness note. Agreeable to now move forward with LP. Aware this will have to be approved via insurance first and then they will be called to schedule this. She verbalized understanding.

## 2022-02-18 ENCOUNTER — Ambulatory Visit: Payer: Medicare Other | Admitting: Neurology

## 2022-02-18 ENCOUNTER — Telehealth: Payer: Self-pay | Admitting: Neurology

## 2022-02-18 ENCOUNTER — Other Ambulatory Visit: Payer: Medicare Other

## 2022-02-18 NOTE — Telephone Encounter (Signed)
I messaged Cathy with [facility] to schedule patient for DG FL GUIDED LUMBAR PUNCTURE .

## 2022-02-22 ENCOUNTER — Other Ambulatory Visit: Payer: Medicare Other

## 2022-02-27 ENCOUNTER — Ambulatory Visit
Admission: RE | Admit: 2022-02-27 | Discharge: 2022-02-27 | Disposition: A | Discharge status: Home / Self Care | Payer: Medicare Other | Source: Ambulatory Visit | Attending: Neurology | Admitting: Neurology

## 2022-02-27 VITALS — BP 128/79 | HR 64

## 2022-02-27 DIAGNOSIS — Z0389 Encounter for observation for other suspected diseases and conditions ruled out: Secondary | ICD-10-CM | POA: Diagnosis not present

## 2022-02-27 DIAGNOSIS — R269 Unspecified abnormalities of gait and mobility: Secondary | ICD-10-CM | POA: Diagnosis not present

## 2022-02-27 DIAGNOSIS — G910 Communicating hydrocephalus: Secondary | ICD-10-CM

## 2022-02-27 NOTE — Discharge Instructions (Signed)

## 2022-03-02 ENCOUNTER — Telehealth: Payer: Self-pay | Admitting: Neurology

## 2022-03-02 DIAGNOSIS — B078 Other viral warts: Secondary | ICD-10-CM | POA: Diagnosis not present

## 2022-03-02 DIAGNOSIS — G910 Communicating hydrocephalus: Secondary | ICD-10-CM

## 2022-03-02 DIAGNOSIS — D045 Carcinoma in situ of skin of trunk: Secondary | ICD-10-CM | POA: Diagnosis not present

## 2022-03-02 DIAGNOSIS — R269 Unspecified abnormalities of gait and mobility: Secondary | ICD-10-CM

## 2022-03-02 NOTE — Telephone Encounter (Signed)
I spoke to his wife.  [name] had the lumbar puncture on 02/27/2022.  He did well and had no complications.  The next day, his wife noted that he was walking little bit better and this was further improved on 03/01/2022.  Additionally, she notes that he seemed more alert and focused.  He also reports that he feels stronger.  As he appears to have had a reasonable benefit from the lumbar puncture, we will have him schedule a visit with neurosurgery to further discuss the possibility of a VP shunt.

## 2022-03-03 ENCOUNTER — Other Ambulatory Visit: Payer: Medicare Other

## 2022-03-03 LAB — CSF CELL COUNT WITH DIFFERENTIAL
RBC Count, CSF: 7200 cells/uL — ABNORMAL HIGH
TOTAL NUCLEATED CELL: 3 cells/uL (ref 0–5)

## 2022-03-03 LAB — GLUCOSE, CSF: Glucose, CSF: 58 mg/dL (ref 40–80)

## 2022-03-03 LAB — VDRL, CSF: VDRL Quant, CSF: NONREACTIVE

## 2022-03-03 LAB — PROTEIN, CSF

## 2022-03-05 ENCOUNTER — Telehealth: Payer: Self-pay | Admitting: Neurology

## 2022-03-05 NOTE — Telephone Encounter (Signed)
Referral for Neurosurgery sent to [facility] [phone]. 

## 2022-03-06 ENCOUNTER — Telehealth: Payer: Self-pay | Admitting: Pharmacist

## 2022-03-06 NOTE — Telephone Encounter (Signed)
Patient's wife called into today asking for clarification of medication dose. She reports that she was listening to a message that I left on VM and was not sure which medication I was referring to but that it was a dose of 162mg.  No recent phone call to patient from me but I did see on 01/09/2022 that I called regarding levothyroxine. Patient's pharmacy has asked if levothyroxine 1539m + 2551mcould be switched to 175m16maily so patient would only have to take 1 tablet.  Was OK's by Dr Paz Larose Kells Rx sent to pharmacy in April  Patient and his wife voiced understanding. Patient states he was already aware of this change. Still has a few of the 150mc36m25mcg80mt but knows to change to 175mcg 64my when he runs out.

## 2022-03-10 ENCOUNTER — Other Ambulatory Visit: Payer: Self-pay | Admitting: Cardiovascular Disease

## 2022-03-10 DIAGNOSIS — I482 Chronic atrial fibrillation, unspecified: Secondary | ICD-10-CM

## 2022-03-11 ENCOUNTER — Other Ambulatory Visit: Payer: Self-pay | Admitting: Internal Medicine

## 2022-03-11 NOTE — Telephone Encounter (Signed)
Eliquis '5mg'$  refill request received. Patient is 86 years old, weight-91.4kg, Crea-1.06 on 08/04/2021, Diagnosis-Afib, and last seen by Dr. Johnsie Cancel on 01/06/2022. Dose is appropriate based on dosing criteria. Will send in refill to requested pharmacy.

## 2022-03-18 DIAGNOSIS — H2513 Age-related nuclear cataract, bilateral: Secondary | ICD-10-CM | POA: Diagnosis not present

## 2022-03-18 DIAGNOSIS — H401134 Primary open-angle glaucoma, bilateral, indeterminate stage: Secondary | ICD-10-CM | POA: Diagnosis not present

## 2022-03-20 DIAGNOSIS — G910 Communicating hydrocephalus: Secondary | ICD-10-CM | POA: Diagnosis not present

## 2022-04-10 ENCOUNTER — Other Ambulatory Visit: Payer: Self-pay | Admitting: Neurosurgery

## 2022-04-10 ENCOUNTER — Telehealth: Payer: Self-pay

## 2022-04-10 ENCOUNTER — Telehealth: Payer: Self-pay | Admitting: *Deleted

## 2022-04-10 NOTE — Telephone Encounter (Signed)
Request to hold Eliqius not indicated but routing to pharmacist in the event that it was overlooked so that clearance is documented if needed.

## 2022-04-10 NOTE — Telephone Encounter (Signed)
Received surgical clearance form from [facility]. Pt is scheduled for VP Shunt Placement under general anesthesia with Dr. Arnoldo Morale on 05/14/2022. They have requested cardiology clearance as well. Please advise if appt is needed.

## 2022-04-10 NOTE — Telephone Encounter (Signed)
   Pre-operative Risk Assessment    Patient Name: [name]  DOB: [birth year] MRN: [id number]      Request for Surgical Clearance    Procedure:   VP SHUNT PLACEMENT  Date of Surgery:  Clearance 05/14/22                                 Surgeon:  DR. Newman Pies Surgeon's Group or Practice Name:  [facility] Phone number:  [phone] Fax number:  [phone]   Type of Clearance Requested:   - Medical  - Pharmacy:  Hold Aspirin NOT INDICATED   Type of Anesthesia:  General    Additional requests/questions:    Astrid Divine   04/10/2022, 10:21 AM

## 2022-04-13 NOTE — Telephone Encounter (Signed)
   Name: [name]  DOB: 09-27-1935  MRN: [id number]  Primary Cardiologist: None   Preoperative team, please contact this patient and set up a phone call appointment for further preoperative risk assessment. Please obtain consent and complete medication review. Thank you for your help.  I confirm that guidance regarding antiplatelet and oral anticoagulation therapy has been completed and, if necessary, noted below.  Patient with diagnosis of afib on Eliquis for anticoagulation.     Procedure: VP shunt placement Date of procedure: 05/14/22     CHA2DS2-VASc Score = 4  This indicates a 4.8% annual risk of stroke. The patient's score is based upon: CHF History: 0 HTN History: 1 Diabetes History: 0 Stroke History: 0 Vascular Disease History: 1 Age Score: 2 Gender Score: 0   CrCl 67 ml/min Platelet count 219K   Per office protocol, patient can hold Eliquis for 2-3 days prior to procedure.   Lenna Sciara, NP 04/13/2022, 1:42 PM [place] [address] [place], [place] [address]

## 2022-04-13 NOTE — Telephone Encounter (Signed)
Patient with diagnosis of afib on Eliquis for anticoagulation.    Procedure: VP shunt placement Date of procedure: 05/14/22   CHA2DS2-VASc Score = 4  This indicates a 4.8% annual risk of stroke. The patient's score is based upon: CHF History: 0 HTN History: 1 Diabetes History: 0 Stroke History: 0 Vascular Disease History: 1 Age Score: 2 Gender Score: 0  CrCl 67 ml/min Platelet count 219K  Per office protocol, patient can hold Eliquis for 2-3 days prior to procedure.

## 2022-04-14 DIAGNOSIS — L97829 Non-pressure chronic ulcer of other part of left lower leg with unspecified severity: Secondary | ICD-10-CM | POA: Diagnosis not present

## 2022-04-14 DIAGNOSIS — I872 Venous insufficiency (chronic) (peripheral): Secondary | ICD-10-CM | POA: Diagnosis not present

## 2022-04-14 NOTE — Telephone Encounter (Signed)
Received fax confirmation

## 2022-04-14 NOTE — Telephone Encounter (Signed)
Clearance form faxed to [facility] at [phone]. Form sent for scanning.

## 2022-04-14 NOTE — Telephone Encounter (Signed)
Cleared by me pending cardiology clearance.

## 2022-04-15 ENCOUNTER — Telehealth: Payer: Self-pay | Admitting: *Deleted

## 2022-04-15 NOTE — Telephone Encounter (Signed)
S/w the pt and he is agreeable to plan of are for tele pre op appt 04/24/22 @ 10:20. Med rec and consent are done.

## 2022-04-15 NOTE — Telephone Encounter (Signed)
S/w the pt and he is agreeable to plan of are for tele pre op appt 04/24/22 @ 10:20. Med rec and consent are done.      Patient Consent for Virtual Visit        [name] has provided verbal consent on 04/15/2022 for a virtual visit (video or telephone).   CONSENT FOR VIRTUAL VISIT FOR:  [name]  By participating in this virtual visit I agree to the following:  I hereby voluntarily request, consent and authorize [facility] and its employed or contracted physicians, physician assistants, nurse practitioners or other licensed health care professionals (the Practitioner), to provide me with telemedicine health care services (the "Services") as deemed necessary by the treating Practitioner. I acknowledge and consent to receive the Services by the Practitioner via telemedicine. I understand that the telemedicine visit will involve communicating with the Practitioner through live audiovisual communication technology and the disclosure of certain medical information by electronic transmission. I acknowledge that I have been given the opportunity to request an in-person assessment or other available alternative prior to the telemedicine visit and am voluntarily participating in the telemedicine visit.  I understand that I have the right to withhold or withdraw my consent to the use of telemedicine in the course of my care at any time, without affecting my right to future care or treatment, and that the Practitioner or I may terminate the telemedicine visit at any time. I understand that I have the right to inspect all information obtained and/or recorded in the course of the telemedicine visit and may receive copies of available information for a reasonable fee.  I understand that some of the potential risks of receiving the Services via telemedicine include:  Delay or interruption in medical evaluation due to technological equipment failure or disruption; Information transmitted may not be  sufficient (e.g. poor resolution of images) to allow for appropriate medical decision making by the Practitioner; and/or  In rare instances, security protocols could fail, causing a breach of personal health information.  Furthermore, I acknowledge that it is my responsibility to provide information about my medical history, conditions and care that is complete and accurate to the best of my ability. I acknowledge that Practitioner's advice, recommendations, and/or decision may be based on factors not within their control, such as incomplete or inaccurate data provided by me or distortions of diagnostic images or specimens that may result from electronic transmissions. I understand that the practice of medicine is not an exact science and that Practitioner makes no warranties or guarantees regarding treatment outcomes. I acknowledge that a copy of this consent can be made available to me via my patient portal ([facility]), or I can request a printed copy by calling the office of [facility].    I understand that my insurance will be billed for this visit.   I have read or had this consent read to me. I understand the contents of this consent, which adequately explains the benefits and risks of the Services being provided via telemedicine.  I have been provided ample opportunity to ask questions regarding this consent and the Services and have had my questions answered to my satisfaction. I give my informed consent for the services to be provided through the use of telemedicine in my medical care

## 2022-04-21 ENCOUNTER — Other Ambulatory Visit: Payer: Self-pay | Admitting: Neurosurgery

## 2022-04-24 ENCOUNTER — Ambulatory Visit (INDEPENDENT_AMBULATORY_CARE_PROVIDER_SITE_OTHER): Payer: Medicare Other | Admitting: Nurse Practitioner

## 2022-04-24 DIAGNOSIS — Z01810 Encounter for preprocedural cardiovascular examination: Secondary | ICD-10-CM | POA: Diagnosis not present

## 2022-04-24 NOTE — Progress Notes (Signed)
Virtual Visit via Telephone Note   Because of [name]'s co-morbid illnesses, he is at least at moderate risk for complications without adequate follow up.  This format is felt to be most appropriate for this patient at this time.  The patient did not have access to video technology/had technical difficulties with video requiring transitioning to audio format only (telephone).  All issues noted in this document were discussed and addressed.  No physical exam could be performed with this format.  Please refer to the patient's chart for his consent to telehealth for [facility].  Evaluation Performed:  Preoperative cardiovascular risk assessment _____________   Date:  04/24/2022   Patient ID:  [name], DOB 1936-08-04, MRN [id number] Patient Location:  Home Provider location:   Office  Primary Care Provider:  Colon Branch, MD Primary Cardiologist:  None  Chief Complaint / Patient Profile   86 y.o. y/o male with a h/o of CAD s/p DES to LCx (2006), carotid artery disease, HTN, HLD  who is pending VP shunt placement and presents today for telephonic preoperative cardiovascular risk assessment.  Past Medical History    Past Medical History:  Diagnosis Date   Arthritis    BPH (benign prostatic hypertrophy)    Carotid artery disease (HCC)    Doppler, December 08, 2011, 00 78% R. ICA, 67-67% LICA, followup 1 year   Coronary artery disease    DES Circumflex or CVA 2006  /  clear, February, 2011, EF 70%, no ischemia or   Ejection fraction    EF 60%, echo, 2009, mildly calcified aortic leaflets   History of colonic polyps    Hyperlipidemia    Hypertension    Hypothyroidism    Lumbar radiculopathy    Multiple thyroid nodules    Avascular echogenic areas noted in the right thyroid at the time of carotid Doppler.    PONV (postoperative nausea and vomiting)    "nausea with first hip surgery"   Skin cancer (melanoma) ([place])    PMH of    Spinal stenosis, lumbar    Tremor     Fine tremor right upper extremity   Venous insufficiency    Past Surgical History:  Procedure Laterality Date   COLONOSCOPY     CORONARY ANGIOPLASTY WITH STENT PLACEMENT  2006   x 2 stents; DES to CX and dRCA '06   KNEE SURGERY  1970's   "chipped bone"   LUMBAR LAMINECTOMY/DECOMPRESSION MICRODISCECTOMY Left 07/13/2016   Procedure: LUMBAR LAMINECTOMY AND FORAMINOTOMY Lumbar two, three, Lumbar three-four, Lumbar four-five, LEFT Lumbar five-Sacral one DISECTOMY;  Surgeon: Newman Pies, MD;  Location: [facility];  Service: Neurosurgery;  Laterality: Left;   TOE SURGERY  1997   TONSILLECTOMY     TOTAL HIP ARTHROPLASTY Left 2007   TOTAL HIP ARTHROPLASTY Right 2010   TOTAL KNEE ARTHROPLASTY Right 06/25/2014   Procedure: RIGHT TOTAL KNEE ARTHROPLASTY;  Surgeon: Gearlean Alf, MD;  Location: [facility];  Service: Orthopedics;  Laterality: Right;    Allergies  Allergies  Allergen Reactions   Amlodipine Besylate Other (See Comments)    REACTION: edema    History of Present Illness    [name] is a 86 y.o. male who presents via audio/video conferencing for a telehealth visit today.  Pt was last seen in cardiology clinic on 01/06/2022 by Dr.Nishan.  At that time [name] was doing well with no cardiac concerns at visit.  He was in sinus rhythm and no medication changes  were made at that time the patient is now pending procedure as outlined above. Since his last visit, he states that he has been doing well with no cardiac complaints. He has not felt any papi   Home Medications    Prior to Admission medications   Medication Sig Start Date End Date Taking? Authorizing Provider  apixaban (ELIQUIS) 5 MG TABS tablet TAKE 1 TABLET BY MOUTH TWICE DAILY 03/11/22   Josue Hector, MD  aspirin 81 MG tablet Take 1 tablet (81 mg total) by mouth daily. Patient taking differently: Take 81 mg by mouth daily. bedtime 03/03/17   Josue Hector, MD  atorvastatin (LIPITOR) 40 MG tablet Take 1.5 tablets  (60 mg total) by mouth at bedtime. 10/09/21   Colon Branch, MD  benazepril (LOTENSIN) 20 MG tablet TAKE 1 AND 1/2 TABLETS(30 MG) BY MOUTH DAILY 01/06/22   Colon Branch, MD  diltiazem (DILACOR XR) 180 MG 24 hr capsule TAKE 1 CAPSULE(180 MG) BY MOUTH DAILY 01/06/22   Colon Branch, MD  furosemide (LASIX) 40 MG tablet TAKE 1 AND 1/2 TABLETS(60 MG) BY MOUTH TWICE DAILY Patient taking differently: Take 60 mg by mouth daily. 12/29/21   Josue Hector, MD  levothyroxine (SYNTHROID) 175 MCG tablet Take 1 tablet (175 mcg total) by mouth daily. 01/09/22   Colon Branch, MD  LUMIGAN 0.01 % SOLN Place 1 drop into both eyes at bedtime. 04/13/16   [provider]  metoprolol tartrate (LOPRESSOR) 25 MG tablet TAKE 1/2 TABLET(12.5 MG) BY MOUTH TWICE DAILY 11/21/21   Colon Branch, MD  nitroGLYCERIN (NITROSTAT) 0.4 MG SL tablet PLACE 1 TABLET UNDER THE TONGUE EVERY 5 MINUTES AS NEEDED FOR CHEST PAIN 04/24/20   Josue Hector, MD  Timolol Maleate 0.5 % (DAILY) SOLN Apply 1 drop to eye in the morning and at bedtime.    [provider]    Physical Exam    Vital Signs:  [name]  does have vital signs available for review today. 114/70 HR 68  Given telephonic nature of communication, physical exam is limited. AAOx3. NAD. Normal affect.  Speech and respirations are unlabored.  Accessory Clinical Findings    None  Assessment & Plan    1.  Preoperative Cardiovascular Risk Assessment:  [name] perioperative risk of a major cardiac event is 0.9% according to the Revised Cardiac Risk Index (RCRI).  Therefore, he is at low risk for perioperative complications.   His functional capacity is fair at 4.06 METs according to the [employer] Activity Status Index (DASI). Recommendations: According to ACC/AHA guidelines, no further cardiovascular testing needed.  The patient may proceed to surgery at acceptable risk.    Antiplatelet and/or Anticoagulation Recommendations: Prefer ASA to be continued unless surgeon  feels bleeding risk is too elevated.  Could be held 7 days prior to procedure if needed. Eliquis (Apixaban) can be held for 2.3 days prior to surgery.  Please resume post op when felt to be safe.      A copy of this note will be routed to requesting surgeon.  Time:   Today, I have spent 10 minutes with the patient with telehealth technology discussing medical history, symptoms, and management plan.     Mable Fill, Marissa Nestle, NP  04/24/2022, 7:10 AM

## 2022-05-03 ENCOUNTER — Other Ambulatory Visit: Payer: Self-pay | Admitting: Internal Medicine

## 2022-05-05 ENCOUNTER — Encounter (HOSPITAL_BASED_OUTPATIENT_CLINIC_OR_DEPARTMENT_OTHER): Payer: Medicare Other | Attending: General Surgery | Admitting: General Surgery

## 2022-05-05 DIAGNOSIS — I872 Venous insufficiency (chronic) (peripheral): Secondary | ICD-10-CM | POA: Insufficient documentation

## 2022-05-05 DIAGNOSIS — Z87891 Personal history of nicotine dependence: Secondary | ICD-10-CM | POA: Diagnosis not present

## 2022-05-05 DIAGNOSIS — L97822 Non-pressure chronic ulcer of other part of left lower leg with fat layer exposed: Secondary | ICD-10-CM | POA: Insufficient documentation

## 2022-05-05 DIAGNOSIS — E785 Hyperlipidemia, unspecified: Secondary | ICD-10-CM | POA: Diagnosis not present

## 2022-05-05 DIAGNOSIS — I480 Paroxysmal atrial fibrillation: Secondary | ICD-10-CM | POA: Insufficient documentation

## 2022-05-05 DIAGNOSIS — Z7901 Long term (current) use of anticoagulants: Secondary | ICD-10-CM | POA: Diagnosis not present

## 2022-05-05 DIAGNOSIS — I2510 Atherosclerotic heart disease of native coronary artery without angina pectoris: Secondary | ICD-10-CM | POA: Diagnosis not present

## 2022-05-05 DIAGNOSIS — L97222 Non-pressure chronic ulcer of left calf with fat layer exposed: Secondary | ICD-10-CM | POA: Diagnosis not present

## 2022-05-05 DIAGNOSIS — Z79899 Other long term (current) drug therapy: Secondary | ICD-10-CM | POA: Diagnosis not present

## 2022-05-05 DIAGNOSIS — I10 Essential (primary) hypertension: Secondary | ICD-10-CM | POA: Insufficient documentation

## 2022-05-06 NOTE — Progress Notes (Addendum)
[name], [place] ([id number]) Visit Report for 05/05/2022 Chief Complaint Document Details Patient Name: Date of Service: [name], [name] 05/05/2022 9:00 A M Medical Record Number: [id number] Patient Account Number: [id number] Date of Birth/Sex: Treating RN: 1936-04-23 (86 y.o. [name] Mention Primary Care Provider: [name] Other Clinician: Referring Provider: Treating Provider/Extender: [name] in Treatment: 0 Information Obtained from: Patient Chief Complaint Left lower extremity wound Electronic Signature(s) Signed: 05/05/2022 10:12:57 AM By: Fredirick Maudlin MD FACS Entered By: Fredirick Maudlin on 05/05/2022 10:12:57 -------------------------------------------------------------------------------- Debridement Details Patient Name: Date of Service: [name], [name] 05/05/2022 9:00 A M Medical Record Number: [id number] Patient Account Number: [id number] Date of Birth/Sex: Treating RN: [birth year] (86 y.o. [name] Mention Primary Care Provider: [name] Other Clinician: Referring Provider: Treating Provider/Extender: [name] in Treatment: 0 Debridement Performed for Assessment: Wound #2 Left,Posterior Lower Leg Performed By: Physician Fredirick Maudlin, MD Debridement Type: Debridement Severity of Tissue Pre Debridement: Fat layer exposed Level of Consciousness (Pre-procedure): Awake and Alert Pre-procedure Verification/Time Out Yes - 10:00 Taken: Start Time: 10:02 Pain Control: Lidocaine 4% T opical Solution T Area Debrided (L x W): otal 2.5 (cm) x 5.1 (cm) = 12.75 (cm) Tissue and other material debrided: Non-Viable, Slough, Slough Level: Non-Viable Tissue Debridement Description: Selective/Open Wound Instrument: Curette Bleeding: Minimum Hemostasis Achieved: Pressure Procedural Pain: 0 Post Procedural Pain: 0 Response to Treatment: Procedure was tolerated well Level of Consciousness (Post- Awake and  Alert procedure): Post Debridement Measurements of Total Wound Length: (cm) 2.5 Width: (cm) 5.1 Depth: (cm) 0.1 Volume: (cm) 1.001 Character of Wound/Ulcer Post Debridement: Improved Severity of Tissue Post Debridement: Fat layer exposed Post Procedure Diagnosis Same as Pre-procedure Electronic Signature(s) Signed: 05/05/2022 11:38:32 AM By: Fredirick Maudlin MD FACS Signed: 05/05/2022 5:03:29 PM By: Baruch Gouty RN, BSN Entered By: Baruch Gouty on 05/05/2022 10:06:40 -------------------------------------------------------------------------------- HPI Details Patient Name: Date of Service: [name], [name] 05/05/2022 9:00 A M Medical Record Number: [id number] Patient Account Number: [id number] Date of Birth/Sex: Treating RN: 05/29/1936 (86 y.o. [name] Mention Primary Care Provider: [name] Other Clinician: Referring Provider: Treating Provider/Extender: [name] in Treatment: 0 History of Present Illness HPI Description: [name] is an 86 year old male with a past medical history of CAD s/p DES to LCx 2006, carotid artery disease, hypertension, hyperlipidemia, paroxysmal atrial fibrillation on Eliquis and Cardizem that presents today for right lower extremity wound. He was seen by Dr. Larose Kells, his primary care provider on 4/29 for this issue and was referred to our clinic. Patient states that he had a small wound that spontaneously started in October 2021 and has not healed. He states this has progressively gotten larger. He has been using acne cream prescribed by the dermatologist for this issue. He reports drainage to the wound but no purulent drainage. He reports mild soreness to the wound. He has swelling in his right leg greater than left and reports this is a chronic issue for the past 1 to 2 years. He denies resting leg pain or pain with ambulation. Of note He was prescribed doxycycline at the end of last month and has finished this course for  possible skin infection to the wound site. 02/06/2021; I am seeing this patient who was admitted to the clinic last week by Dr. Heber Hugo. He has predominantly I think a venous insufficiency wound on the right medial lower leg he has been using Santyl Hydrofera Blue under 3 layer compression. Arterial  studies were ordered last week but do not seem to been put through. He is not a diabetic. He did have venous reflux studies done in August 2020. He did have abnormal reflux time was noted in the great saphenous vein in the distal thigh and the great saphenous vein at the knee. There was no evidence of DVT or SVT at the time he was not felt to have large enough saphenous veins on either side for intervention. Compression stockings at least knee-high were recommended. Follow-up was on a as needed basis with Dr. Donzetta Matters The patient does not describe claudication but his pulses in his feet are not vibrant this could be because of the swelling 5/26; patient presents for 1 week follow-up. He has been using Hydrofera Blue under 3 layer compression. He had arterial studies done. He has no issues or complaints today. He denies signs of infection. He has his juxta light compressions with him today. 6/3; patient presents for 1 week follow-up. He has been using Hydrofera Blue under 3 layer compression. He denies signs and symptoms of infection. He did have bright green drainage which he states he has not seen before when the dressing was taken off today. He is using his juxta light compression to the left leg. 6/10; patient presents for 1 week follow-up. He has been tolerating Hydrofera Blue with 3 layer compression. He again reports bright green drainage. However he denies any signs of infection. He has been using juxta light compression to the left leg. 6/24; patient presents for 2-week follow-up. He has been tolerating Hydrofera Blue with 3 layer compression. Today he is healed. He brought his juxta lite compression  for the right leg and continues to wear the juxta light compression on the left leg READMISSION 05/05/2022 The patient returns to clinic today with a new wound on his left posterior leg. He has been wearing his juxta lite stockings on a regular basis, but on exam he still has 3+ pitting edema. I suspect he has lymphedema in addition to his known venous reflux. The wounds are scattered geographic wounds with light slough accumulation. They have been applying mupirocin and CeraVe ointment. He does have a VP shunt placement scheduled for next week, but it sounds like his neurosurgeon is not aware of the fact that he has an open wound. Electronic Signature(s) Signed: 05/05/2022 10:14:35 AM By: Fredirick Maudlin MD FACS Entered By: Fredirick Maudlin on 05/05/2022 10:14:34 -------------------------------------------------------------------------------- Physical Exam Details Patient Name: Date of Service: [name], [name] 05/05/2022 9:00 A M Medical Record Number: [id number] Patient Account Number: [id number] Date of Birth/Sex: Treating RN: January 02, 1936 (86 y.o. [name] Mention Primary Care Provider: [name] Other Clinician: Referring Provider: Treating Provider/Extender: [name] in Treatment: 0 Constitutional . Bradycardic, asymptomatic.. . . No acute distress. Respiratory Normal work of breathing on room air.. Cardiovascular Skin changes consistent with longstanding lymphedema. 2-3+ pitting edema bilaterally. Notes 05/05/2022: The wounds are scattered geographic wounds with light slough accumulation. Electronic Signature(s) Signed: 05/05/2022 10:18:18 AM By: Fredirick Maudlin MD FACS Entered By: Fredirick Maudlin on 05/05/2022 10:18:18 -------------------------------------------------------------------------------- Physician Orders Details Patient Name: Date of Service: [name], [name] 05/05/2022 9:00 A M Medical Record Number: [id number] Patient Account  Number: [id number] Date of Birth/Sex: Treating RN: Oct 09, 1935 (86 y.o. [name] Mention Primary Care Provider: [name] Other Clinician: Referring Provider: Treating Provider/Extender: [name] in Treatment: 0 Verbal / Phone Orders: No Diagnosis Coding ICD-10 Coding Code Description I87.2 Venous  insufficiency (chronic) (peripheral) I48.91 Unspecified atrial fibrillation I10 Essential (primary) hypertension I25.10 Atherosclerotic heart disease of native coronary artery without angina pectoris L97.822 Non-pressure chronic ulcer of other part of left lower leg with fat layer exposed Follow-up Appointments ppointment in 1 week. - Dr. Celine Ahr RM 1 with [facility] Return A Monday 8/21 @ 11:15 am Anesthetic (In clinic) Topical Lidocaine 4% applied to wound bed Bathing/ Shower/ Hygiene May shower with protection but do not get wound dressing(s) wet. - can purchase cast protector at [facility] or [facility] Edema Control - Lymphedema / SCD / Other Elevate legs to the level of the heart or above for 30 minutes daily and/or when sitting, a frequency of: - throughout the day Avoid standing for long periods of time. Exercise regularly - including ankle circles while sitting Compression stocking or Garment 20-30 mm/Hg pressure to: - right leg daily Wound Treatment Wound #2 - Lower Leg Wound Laterality: Left, Posterior Peri-Wound Care: Sween Lotion (Moisturizing lotion) 1 x Per Week/30 Days Discharge Instructions: Apply moisturizing lotion as directed Prim Dressing: Hydrofera Blue Ready Foam, 2.5 x2.5 in 1 x Per Week/30 Days ary Discharge Instructions: Apply to wound bed as instructed Secondary Dressing: Woven Gauze Sponge, Non-Sterile 4x4 in 1 x Per Week/30 Days Discharge Instructions: Apply over primary dressing as directed. Compression Wrap: ThreePress (3 layer compression wrap) 1 x Per Week/30 Days Discharge Instructions: Apply three layer compression as  directed. Electronic Signature(s) Signed: 05/05/2022 11:38:32 AM By: Fredirick Maudlin MD FACS Entered By: Fredirick Maudlin on 05/05/2022 10:18:32 -------------------------------------------------------------------------------- Problem List Details Patient Name: Date of Service: [name], [name] 05/05/2022 9:00 A M Medical Record Number: [id number] Patient Account Number: [id number] Date of Birth/Sex: Treating RN: Nov 18, 1935 (86 y.o. [name] Mention Primary Care Provider: [name] Other Clinician: Referring Provider: Treating Provider/Extender: [name] in Treatment: 0 Active Problems ICD-10 Encounter Code Description Active Date MDM Diagnosis I87.2 Venous insufficiency (chronic) (peripheral) 05/05/2022 No Yes I48.91 Unspecified atrial fibrillation 05/05/2022 No Yes I10 Essential (primary) hypertension 05/05/2022 No Yes I25.10 Atherosclerotic heart disease of native coronary artery without angina pectoris 05/05/2022 No Yes L97.822 Non-pressure chronic ulcer of other part of left lower leg with fat layer exposed8/15/2023 No Yes Inactive Problems Resolved Problems Electronic Signature(s) Signed: 05/05/2022 10:10:26 AM By: Fredirick Maudlin MD FACS Previous Signature: 05/05/2022 9:34:08 AM Version By: Fredirick Maudlin MD FACS Entered By: Fredirick Maudlin on 05/05/2022 10:10:26 -------------------------------------------------------------------------------- Progress Note Details Patient Name: Date of Service: [name], [name] 05/05/2022 9:00 A M Medical Record Number: [id number] Patient Account Number: [id number] Date of Birth/Sex: Treating RN: 07-26-1936 (86 y.o. [name] Mention Primary Care Provider: [name] Other Clinician: Referring Provider: Treating Provider/Extender: [name] in Treatment: 0 Subjective Chief Complaint Information obtained from Patient Left lower extremity wound History of Present Illness (HPI) Mr.  [name] is an 86 year old male with a past medical history of CAD s/p DES to LCx 2006, carotid artery disease, hypertension, hyperlipidemia, paroxysmal atrial fibrillation on Eliquis and Cardizem that presents today for right lower extremity wound. He was seen by Dr. Larose Kells, his primary care provider on 4/29 for this issue and was referred to our clinic. Patient states that he had a small wound that spontaneously started in October 2021 and has not healed. He states this has progressively gotten larger. He has been using acne cream prescribed by the dermatologist for this issue. He reports drainage to the wound but no purulent drainage. He reports mild soreness to the wound.  He has swelling in his right leg greater than left and reports this is a chronic issue for the past 1 to 2 years. He denies resting leg pain or pain with ambulation. Of note He was prescribed doxycycline at the end of last month and has finished this course for possible skin infection to the wound site. 02/06/2021; I am seeing this patient who was admitted to the clinic last week by Dr. Heber [place]. He has predominantly I think a venous insufficiency wound on the right medial lower leg he has been using Santyl Hydrofera Blue under 3 layer compression. Arterial studies were ordered last week but do not seem to been put through. He is not a diabetic. He did have venous reflux studies done in August 2020. He did have abnormal reflux time was noted in the great saphenous vein in the distal thigh and the great saphenous vein at the knee. There was no evidence of DVT or SVT at the time he was not felt to have large enough saphenous veins on either side for intervention. Compression stockings at least knee-high were recommended. Follow-up was on a as needed basis with Dr. Donzetta Matters The patient does not describe claudication but his pulses in his feet are not vibrant this could be because of the swelling 5/26; patient presents for 1 week follow-up. He  has been using Hydrofera Blue under 3 layer compression. He had arterial studies done. He has no issues or complaints today. He denies signs of infection. He has his juxta light compressions with him today. 6/3; patient presents for 1 week follow-up. He has been using Hydrofera Blue under 3 layer compression. He denies signs and symptoms of infection. He did have bright green drainage which he states he has not seen before when the dressing was taken off today. He is using his juxta light compression to the left leg. 6/10; patient presents for 1 week follow-up. He has been tolerating Hydrofera Blue with 3 layer compression. He again reports bright green drainage. However he denies any signs of infection. He has been using juxta light compression to the left leg. 6/24; patient presents for 2-week follow-up. He has been tolerating Hydrofera Blue with 3 layer compression. Today he is healed. He brought his juxta lite compression for the right leg and continues to wear the juxta light compression on the left leg READMISSION 05/05/2022 The patient returns to clinic today with a new wound on his left posterior leg. He has been wearing his juxta lite stockings on a regular basis, but on exam he still has 3+ pitting edema. I suspect he has lymphedema in addition to his known venous reflux. The wounds are scattered geographic wounds with light slough accumulation. They have been applying mupirocin and CeraVe ointment. He does have a VP shunt placement scheduled for next week, but it sounds like his neurosurgeon is not aware of the fact that he has an open wound. Patient History Information obtained from Patient. Allergies No Known Allergies Family History Cancer - Father, Diabetes - Maternal Grandparents, Heart Disease - Maternal Grandparents, Hypertension - Maternal Grandparents, No family history of Hereditary Spherocytosis, Kidney Disease, Lung Disease, Seizures, Stroke, Thyroid Problems,  Tuberculosis. Social History Former smoker - Quit over 30 years ago, Marital Status - Married, Alcohol Use - Rarely, Drug Use - No History, Caffeine Use - Daily. Medical History Eyes Patient has history of Glaucoma Cardiovascular Patient has history of Arrhythmia - afib, Coronary Artery Disease, Hypertension, Peripheral Venous Disease Endocrine Denies history of Type I  Diabetes, Type II Diabetes Genitourinary Denies history of End Stage Renal Disease Integumentary (Skin) Denies history of History of Burn Musculoskeletal Patient has history of Osteoarthritis Oncologic Denies history of Received Chemotherapy, Received Radiation Psychiatric Denies history of Anorexia/bulimia, Confinement Anxiety Hospitalization/Surgery History - lumbar laminectomy. - total knee arthropathy right. - bil total hip arthroplasty. - coronary stents. - toe surgery. - tonsillectomy. Medical A Surgical History Notes nd Gastrointestinal colon polyps Endocrine Hypothyroidism Genitourinary BPH Musculoskeletal Bilat Hip Replacements, Right Knee Replacement, lumbar stenosis Neurologic hydrocephalus Oncologic Skin Cancer multiple sites Review of Systems (ROS) Constitutional Symptoms (General Health) Denies complaints or symptoms of Fatigue, Fever, Chills, Marked Weight Change. Eyes Complains or has symptoms of Glasses / Contacts. Denies complaints or symptoms of Dry Eyes, Vision Changes. Ear/Nose/Mouth/Throat Denies complaints or symptoms of Chronic sinus problems or rhinitis. Respiratory Denies complaints or symptoms of Chronic or frequent coughs, Shortness of Breath. Cardiovascular Denies complaints or symptoms of Chest pain. Gastrointestinal Denies complaints or symptoms of Frequent diarrhea, Nausea, Vomiting. Endocrine Denies complaints or symptoms of Heat/cold intolerance. Genitourinary Denies complaints or symptoms of Frequent urination. Integumentary (Skin) Complains or has symptoms of  Wounds - left lower leg. Musculoskeletal Complains or has symptoms of Muscle Weakness. Denies complaints or symptoms of Muscle Pain. Neurologic Denies complaints or symptoms of Numbness/parasthesias. Psychiatric Denies complaints or symptoms of Claustrophobia, Suicidal. Objective Constitutional Bradycardic, asymptomatic.Marland Kitchen No acute distress. Vitals Time Taken: 9:28 AM, Height: 65 in, Source: Stated, Weight: 195 lbs, Source: Stated, BMI: 32.4, Temperature: 97.7 F, Pulse: 45 bpm, Respiratory Rate: 18 breaths/min, Blood Pressure: 116/68 mmHg. Respiratory Normal work of breathing on room air.. Cardiovascular Skin changes consistent with longstanding lymphedema. 2-3+ pitting edema bilaterally. General Notes: 05/05/2022: The wounds are scattered geographic wounds with light slough accumulation. Integumentary (Hair, Skin) Wound #2 status is Open. Original cause of wound was Gradually Appeared. The date acquired was: 03/30/2022. The wound is located on the Left,Posterior Lower Leg. The wound measures 2.5cm length x 5.1cm width x 0.1cm depth; 10.014cm^2 area and 1.001cm^3 volume. There is Fat Layer (Subcutaneous Tissue) exposed. There is no tunneling or undermining noted. There is a medium amount of serous drainage noted. The wound margin is indistinct and nonvisible. There is small (1-33%) pink granulation within the wound bed. There is a large (67-100%) amount of necrotic tissue within the wound bed including Adherent Slough. Assessment Active Problems ICD-10 Venous insufficiency (chronic) (peripheral) Unspecified atrial fibrillation Essential (primary) hypertension Atherosclerotic heart disease of native coronary artery without angina pectoris Non-pressure chronic ulcer of other part of left lower leg with fat layer exposed Procedures Wound #2 Pre-procedure diagnosis of Wound #2 is a Venous Leg Ulcer located on the Left,Posterior Lower Leg .Severity of Tissue Pre Debridement is: Fat  layer exposed. There was a Selective/Open Wound Non-Viable Tissue Debridement with a total area of 12.75 sq cm performed by Fredirick Maudlin, MD. With the following instrument(s): Curette to remove Non-Viable tissue/material. Material removed includes [facility] after achieving pain control using Lidocaine 4% Topical Solution. No specimens were taken. A time out was conducted at 10:00, prior to the start of the procedure. A Minimum amount of bleeding was controlled with Pressure. The procedure was tolerated well with a pain level of 0 throughout and a pain level of 0 following the procedure. Post Debridement Measurements: 2.5cm length x 5.1cm width x 0.1cm depth; 1.001cm^3 volume. Character of Wound/Ulcer Post Debridement is improved. Severity of Tissue Post Debridement is: Fat layer exposed. Post procedure Diagnosis Wound #2: Same as Pre-Procedure Pre-procedure diagnosis of  Wound #2 is a Venous Leg Ulcer located on the Left,Posterior Lower Leg . There was a Three Layer Compression Therapy Procedure by Baruch Gouty, RN. Post procedure Diagnosis Wound #2: Same as Pre-Procedure Plan Follow-up Appointments: Return Appointment in 1 week. - Dr. Celine Ahr RM 1 with [facility] Monday 8/21 @ 11:15 am Anesthetic: (In clinic) Topical Lidocaine 4% applied to wound bed Bathing/ Shower/ Hygiene: May shower with protection but do not get wound dressing(s) wet. - can purchase cast protector at [facility] or [facility] Edema Control - Lymphedema / SCD / Other: Elevate legs to the level of the heart or above for 30 minutes daily and/or when sitting, a frequency of: - throughout the day Avoid standing for long periods of time. Exercise regularly - including ankle circles while sitting Compression stocking or Garment 20-30 mm/Hg pressure to: - right leg daily WOUND #2: - Lower Leg Wound Laterality: Left, Posterior Peri-Wound Care: Sween Lotion (Moisturizing lotion) 1 x Per Week/30 Days Discharge Instructions: Apply  moisturizing lotion as directed Prim Dressing: Hydrofera Blue Ready Foam, 2.5 x2.5 in 1 x Per Week/30 Days ary Discharge Instructions: Apply to wound bed as instructed Secondary Dressing: Woven Gauze Sponge, Non-Sterile 4x4 in 1 x Per Week/30 Days Discharge Instructions: Apply over primary dressing as directed. Com pression Wrap: ThreePress (3 layer compression wrap) 1 x Per Week/30 Days Discharge Instructions: Apply three layer compression as directed. 05/05/2022: This is an 86 year old man with known venous reflux and by exam, stage II lymphedema. Despite use of juxta lite compression stockings, he has developed new wounds on his left lower extremity. The wounds are scattered geographic wounds with light slough accumulation. I used a curette to debride the wounds. He responded well to [facility] and 3 layer compression so we will apply this today. He should continue to wear his juxta lite stocking on the right leg. He will likely benefit from lymphedema pumps so we will plan to order those in the future. Of note, he is scheduled to undergo VP shunt placement next week. According to the patient and his wife, the neurosurgeon performing the operation is unaware that the patient has an open wound. I asked them to please let the surgeon know as he may wish to delay the operative procedure in the presence of these wounds. I will plan to see the patient back next week. Electronic Signature(s) Signed: 05/05/2022 10:20:23 AM By: Fredirick Maudlin MD FACS Entered By: Fredirick Maudlin on 05/05/2022 10:20:22 -------------------------------------------------------------------------------- HxROS Details Patient Name: Date of Service: [name], [name] 05/05/2022 9:00 A M Medical Record Number: [id number] Patient Account Number: [id number] Date of Birth/Sex: Treating RN: 19-Dec-1935 (86 y.o. [name] Mention Primary Care Provider: [name] Other Clinician: Referring Provider: Treating  Provider/Extender: [name] in Treatment: 0 Information Obtained From Patient Constitutional Symptoms (General Health) Complaints and Symptoms: Negative for: Fatigue; Fever; Chills; Marked Weight Change Eyes Complaints and Symptoms: Positive for: Glasses / Contacts Negative for: Dry Eyes; Vision Changes Medical History: Positive for: Glaucoma Ear/Nose/Mouth/Throat Complaints and Symptoms: Negative for: Chronic sinus problems or rhinitis Respiratory Complaints and Symptoms: Negative for: Chronic or frequent coughs; Shortness of Breath Cardiovascular Complaints and Symptoms: Negative for: Chest pain Medical History: Positive for: Arrhythmia - afib; Coronary Artery Disease; Hypertension; Peripheral Venous Disease Gastrointestinal Complaints and Symptoms: Negative for: Frequent diarrhea; Nausea; Vomiting Medical History: Past Medical History Notes: colon polyps Endocrine Complaints and Symptoms: Negative for: Heat/cold intolerance Medical History: Negative for: Type I Diabetes; Type II Diabetes Past Medical  History Notes: Hypothyroidism Genitourinary Complaints and Symptoms: Negative for: Frequent urination Medical History: Negative for: End Stage Renal Disease Past Medical History Notes: BPH Integumentary (Skin) Complaints and Symptoms: Positive for: Wounds - left lower leg Medical History: Negative for: History of Burn Musculoskeletal Complaints and Symptoms: Positive for: Muscle Weakness Negative for: Muscle Pain Medical History: Positive for: Osteoarthritis Past Medical History Notes: Bilat Hip Replacements, Right Knee Replacement, lumbar stenosis Neurologic Complaints and Symptoms: Negative for: Numbness/parasthesias Medical History: Past Medical History Notes: hydrocephalus Psychiatric Complaints and Symptoms: Negative for: Claustrophobia; Suicidal Medical History: Negative for: Anorexia/bulimia; Confinement  Anxiety Hematologic/Lymphatic Immunological Oncologic Medical History: Negative for: Received Chemotherapy; Received Radiation Past Medical History Notes: Skin Cancer multiple sites HBO Extended History Items Eyes: Glaucoma Immunizations Pneumococcal Vaccine: Received Pneumococcal Vaccination: Yes Received Pneumococcal Vaccination On or After 60th Birthday: Yes Implantable Devices None Hospitalization / Surgery History Type of Hospitalization/Surgery lumbar laminectomy total knee arthropathy right bil total hip arthroplasty coronary stents toe surgery tonsillectomy Family and Social History Cancer: Yes - Father; Diabetes: Yes - Maternal Grandparents; Heart Disease: Yes - Maternal Grandparents; Hereditary Spherocytosis: No; Hypertension: Yes - Maternal Grandparents; Kidney Disease: No; Lung Disease: No; Seizures: No; Stroke: No; Thyroid Problems: No; Tuberculosis: No; Former smoker - Quit over 30 years ago; Marital Status - Married; Alcohol Use: Rarely; Drug Use: No History; Caffeine Use: Daily; Financial Concerns: No; Food, Clothing or Shelter Needs: No; Support System Lacking: No; Transportation Concerns: No Electronic Signature(s) Signed: 05/05/2022 11:38:32 AM By: Fredirick Maudlin MD FACS Signed: 05/05/2022 5:03:29 PM By: Baruch Gouty RN, BSN Entered By: Baruch Gouty on 05/05/2022 09:37:56 -------------------------------------------------------------------------------- SuperBill Details Patient Name: Date of Service: [name], [name] 05/05/2022 Medical Record Number: [id number] Patient Account Number: [id number] Date of Birth/Sex: Treating RN: 12/26/1935 (86 y.o. 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 Facility Procedures CPT4 Code: 47829562 Description: [place] VISIT-LEV 3 EST PT Modifier: Quantity: 1 CPT4 Code: 13086578 Description: 46962 - DEBRIDE WOUND 1ST 20 SQ CM OR < ICD-10 Diagnosis Description L97.822 Non-pressure chronic ulcer of other part of left lower leg with fat layer expose Modifier: d Quantity: 1 Physician Procedures : CPT4 Code Description Modifier [id number] - WC PHYS LEVEL 4 - EST PT 25 ICD-10 Diagnosis Description L97.822 Non-pressure chronic ulcer of other part of left lower leg with fat layer exposed I87.2 Venous insufficiency (chronic) (peripheral) I25.10  Atherosclerotic heart disease of native coronary artery without angina pectoris I48.91 Unspecified atrial fibrillation Quantity: 1 : [id number] - WC PHYS DEBR WO ANESTH 20 SQ CM ICD-10 Diagnosis Description L97.822 Non-pressure chronic ulcer of other part of left lower leg with fat layer exposed Quantity: 1 Electronic Signature(s) Signed: 05/21/2022 8:28:28 AM By: [name] RN, BSN Signed: 06/04/2022 10:03:28 AM By: Fredirick Maudlin MD FACS Previous Signature: 05/05/2022 10:20:44 AM Version By: Fredirick Maudlin MD FACS Entered By: [name] on 05/21/2022 47:42:59

## 2022-05-06 NOTE — Progress Notes (Addendum)
[name], [name] ([id number]) Visit Report for 05/05/2022 Allergy List Details Patient Name: Date of Service: [name], [name] 05/05/2022 9:00 A M Medical Record Number: [id number] Patient Account Number: [id number] Date of Birth/Sex: Treating RN: Dec 26, 1935 (86 y.o. [name] Primary Care [name]: [name] Other Clinician: Referring [name]: Treating [name]/Extender: [name] in Treatment: 0 Allergies Active Allergies No Known Allergies Allergy Notes Electronic Signature(s) Signed: 05/05/2022 5:03:29 PM By: Baruch Gouty RN, BSN Entered By: Baruch Gouty on 05/05/2022 09:31:33 -------------------------------------------------------------------------------- [place] Details Patient Name: Date of Service: [name], [name] 05/05/2022 9:00 A M Medical Record Number: [id number] Patient Account Number: [id number] Date of Birth/Sex: Treating RN: 1936/06/03 (86 y.o. [name] Primary Care [name]: [name] Other Clinician: Referring [name]: Treating [name]/Extender: [name] in Treatment: 0 Visit Information Patient Arrived: Cane Arrival Time: 09:21 Accompanied By: spouse Transfer Assistance: None Patient Identification Verified: Yes Secondary Verification Process Completed: Yes Patient Requires Transmission-Based Precautions: No Patient Has Alerts: Yes Patient Alerts: R ABI = 1.09, TBI= .77 L ABI = 1.02, TBI = .83 History Since Last Visit Has Dressing in Place as Prescribed: No Has Compression in Place as Prescribed: No Pain Present Now: No Electronic Signature(s) Signed: 05/05/2022 5:03:29 PM By: Baruch Gouty RN, BSN Entered By: Baruch Gouty on 05/05/2022 09:57:33 -------------------------------------------------------------------------------- Clinic Level of Care Assessment Details Patient Name: Date of Service: [name], [name] 05/05/2022 9:00 A M Medical Record Number:  [id number] Patient Account Number: [id number] Date of Birth/Sex: Treating RN: Oct 08, 1935 (86 y.o. [name] Primary Care [name]: [name] Other Clinician: Referring [name]: Treating [name]/Extender: [name] in Treatment: 0 Clinic Level of Care Assessment Items TOOL 1 Quantity Score X- 1 0 Use when EandM and Procedure is performed on INITIAL visit ASSESSMENTS - Nursing Assessment / Reassessment X- 1 20 General Physical Exam (combine w/ comprehensive assessment (listed just below) when performed on new pt. evals) X- 1 25 Comprehensive Assessment (HX, ROS, Risk Assessments, Wounds Hx, etc.) ASSESSMENTS - Wound and Skin Assessment / Reassessment X- 1 10 Dermatologic / Skin Assessment (not related to wound area) ASSESSMENTS - Ostomy and/or Continence Assessment and Care '[]'$  - 0 Incontinence Assessment and Management '[]'$  - 0 Ostomy Care Assessment and Management (repouching, etc.) PROCESS - Coordination of Care X - Simple Patient / Family Education for ongoing care 1 15 '[]'$  - 0 Complex (extensive) Patient / Family Education for ongoing care X- 1 10 Staff obtains Programmer, systems, Records, T Results / Process Orders est '[]'$  - 0 Staff telephones HHA, Nursing Homes / Clarify orders / etc '[]'$  - 0 Routine Transfer to another Facility (non-emergent condition) '[]'$  - 0 Routine Hospital Admission (non-emergent condition) X- 1 15 New Admissions / Biomedical engineer / Ordering NPWT Apligraf, etc. , '[]'$  - 0 Emergency Hospital Admission (emergent condition) PROCESS - Special Needs '[]'$  - 0 Pediatric / Minor Patient Management '[]'$  - 0 Isolation Patient Management '[]'$  - 0 Hearing / Language / Visual special needs '[]'$  - 0 Assessment of Community assistance (transportation, D/C planning, etc.) '[]'$  - 0 Additional assistance / Altered mentation '[]'$  - 0 Support Surface(s) Assessment (bed, cushion, seat, etc.) INTERVENTIONS - Miscellaneous '[]'$  - 0 External ear  exam '[]'$  - 0 Patient Transfer (multiple staff / Civil Service fast streamer / Similar devices) '[]'$  - 0 Simple Staple / Suture removal (25 or less) '[]'$  - 0 Complex Staple / Suture removal (26 or more) '[]'$  - 0 Hypo/Hyperglycemic Management (do  not check if billed separately) '[]'$  - 0 Ankle / Brachial Index (ABI) - do not check if billed separately Has the patient been seen at the hospital within the last three years: Yes Total Score: 95 Level Of Care: New/Established - Level 3 Electronic Signature(s) Signed: 05/21/2022 8:52:19 AM By: [name] Pilling RN, BSN Entered By: [name] on 05/21/2022 08:28:11 -------------------------------------------------------------------------------- Compression Therapy Details Patient Name: Date of Service: [name], [name] 05/05/2022 9:00 A M Medical Record Number: [id number] Patient Account Number: [id number] Date of Birth/Sex: Treating RN: 1936/06/01 (86 y.o. [name] Primary Care [name]: [name] Other Clinician: Referring [name]: Treating [name]/Extender: [name] in Treatment: 0 Compression Therapy Performed for Wound Assessment: Wound #2 [place] Lower Leg Performed By: Clinician Baruch Gouty, RN Compression Type: Three Layer Post Procedure Diagnosis Same as Pre-procedure Electronic Signature(s) Signed: 05/05/2022 5:03:29 PM By: Baruch Gouty RN, BSN Entered By: Baruch Gouty on 05/05/2022 10:06:54 -------------------------------------------------------------------------------- Encounter Discharge Information Details Patient Name: Date of Service: [name], [name] 05/05/2022 9:00 A M Medical Record Number: [id number] Patient Account Number: [id number] Date of Birth/Sex: Treating RN: 1936/08/12 (86 y.o. [name] Primary Care [name]: [name] Other Clinician: Referring [name]: Treating [name]/Extender: [name] in Treatment: 0 Encounter Discharge Information  Items Post Procedure Vitals Discharge Condition: Stable Temperature (F): 97.7 Ambulatory Status: Cane Pulse (bpm): 45 Discharge Destination: Home Respiratory Rate (breaths/min): 18 Transportation: Private Auto Blood Pressure (mmHg): 116/68 Accompanied By: spouse Schedule Follow-up Appointment: Yes Clinical Summary of Care: Patient Declined Electronic Signature(s) Signed: 05/05/2022 5:03:29 PM By: Baruch Gouty RN, BSN Entered By: Baruch Gouty on 05/05/2022 10:29:11 -------------------------------------------------------------------------------- Lower Extremity Assessment Details Patient Name: Date of Service: [name], [name] 05/05/2022 9:00 A M Medical Record Number: [id number] Patient Account Number: [id number] Date of Birth/Sex: Treating RN: 13-Apr-1936 (86 y.o. [name] Primary Care Arnita Koons: [name] Other Clinician: Referring Kyler Germer: Treating Unita Detamore/Extender: [name] in Treatment: 0 Edema Assessment Assessed: [Left: No] [Right: No] E[Left: dema] [Right: :] Calf Left: Right: Point of Measurement: 30 cm From Medial Instep 41.5 cm Ankle Left: Right: Point of Measurement: 13 cm From Medial Instep 25 cm Knee To Floor Left: Right: From Medial Instep 40 cm Vascular Assessment Pulses: Dorsalis Pedis Palpable: [Left:No] Electronic Signature(s) Signed: 05/05/2022 5:03:29 PM By: Baruch Gouty RN, BSN Entered By: Baruch Gouty on 05/05/2022 09:45:12 -------------------------------------------------------------------------------- Multi Wound Chart Details Patient Name: Date of Service: [name], [name] 05/05/2022 9:00 A M Medical Record Number: [id number] Patient Account Number: [id number] Date of Birth/Sex: Treating RN: 06/18/1936 (86 y.o. [name] Primary Care Tria Noguera: [name] Other Clinician: Referring Makaylah Oddo: Treating Alycea Segoviano/Extender: [name] in Treatment: 0 Vital  Signs Height(in): 65 Pulse(bpm): 45 Weight(lbs): 195 Blood Pressure(mmHg): 116/68 Body Mass Index(BMI): 32.4 Temperature(F): 97.7 Respiratory Rate(breaths/min): 18 Photos: [N/A:N/A] Left, Posterior Lower Leg N/A N/A Wound Location: Gradually Appeared N/A N/A Wounding Event: Venous Leg Ulcer N/A N/A Primary Etiology: Lymphedema N/A N/A Secondary Etiology: Glaucoma, Arrhythmia, Coronary N/A N/A Comorbid History: Artery Disease, Hypertension, Peripheral Venous Disease, Osteoarthritis 03/30/2022 N/A N/A Date Acquired: 0 N/A N/A [name] of Treatment: Open N/A N/A Wound Status: No N/A N/A Wound Recurrence: 2.5x5.1x0.1 N/A N/A Measurements L x W x D (cm) 10.014 N/A N/A A (cm) : rea 1.001 N/A N/A Volume (cm) : 0.00% N/A N/A % Reduction in Area: 0.00% N/A N/A % Reduction in Volume: Full Thickness Without Exposed N/A N/A Classification: Support  Structures Medium N/A N/A Exudate A mount: Serous N/A N/A Exudate Type: amber N/A N/A Exudate Color: Indistinct, nonvisible N/A N/A Wound Margin: Small (1-33%) N/A N/A Granulation A mount: Pink N/A N/A Granulation Quality: Large (67-100%) N/A N/A Necrotic A mount: Fat Layer (Subcutaneous Tissue): Yes N/A N/A Exposed Structures: Fascia: No Tendon: No Muscle: No Joint: No Bone: No Medium (34-66%) N/A N/A Epithelialization: Debridement - Selective/Open Wound N/A N/A Debridement: Pre-procedure Verification/Time Out 10:00 N/A N/A Taken: Lidocaine 4% Topical Solution N/A N/A Pain Control: Slough N/A N/A Tissue Debrided: Non-Viable Tissue N/A N/A Level: 12.75 N/A N/A Debridement A (sq cm): rea Curette N/A N/A Instrument: Minimum N/A N/A Bleeding: Pressure N/A N/A Hemostasis A chieved: 0 N/A N/A Procedural Pain: 0 N/A N/A Post Procedural Pain: Procedure was tolerated well N/A N/A Debridement Treatment Response: 2.5x5.1x0.1 N/A N/A Post Debridement Measurements L x W x D (cm) 1.001 N/A N/A Post  Debridement Volume: (cm) Compression Therapy N/A N/A Procedures Performed: Debridement Treatment Notes Electronic Signature(s) Signed: 05/05/2022 10:10:32 AM By: Fredirick Maudlin MD FACS Signed: 05/05/2022 5:03:29 PM By: Baruch Gouty RN, BSN Entered By: Fredirick Maudlin on 05/05/2022 10:10:32 -------------------------------------------------------------------------------- Pain Assessment Details Patient Name: Date of Service: [name], [name] 05/05/2022 9:00 A M Medical Record Number: [id number] Patient Account Number: [id number] Date of Birth/Sex: Treating RN: 13-Oct-1935 (86 y.o. [name] Primary Care Marquie Aderhold: [name] Other Clinician: Referring Ronrico Dupin: Treating Leyah Bocchino/Extender: [name] in Treatment: 0 Active Problems Location of Pain Severity and Description of Pain Patient Has Paino No Site Locations Rate the pain. Rate the pain. Current Pain Level: 0 Character of Pain Describe the Pain: Tender Pain Management and Medication Current Pain Management: Electronic Signature(s) Signed: 05/05/2022 5:03:29 PM By: Baruch Gouty RN, BSN Entered By: Baruch Gouty on 05/05/2022 09:49:34 -------------------------------------------------------------------------------- Patient/Caregiver Education Details Patient Name: Date of Service: [name], [place] 8/15/2023andnbsp9:00 A M Medical Record Number: [id number] Patient Account Number: [id number] Date of Birth/Gender: Treating RN: 1936/05/16 (86 y.o. [name] Primary Care Physician: [name] Other Clinician: Referring Physician: Treating Physician/Extender: [name] in Treatment: 0 Education Assessment Education Provided To: Patient Education Topics Provided Venous: Handouts: Controlling Swelling with Multilayered Compression Wraps, Managing Venous Disease and Related Ulcers Methods: Explain/Verbal, Printed Responses: Reinforcements needed,  State content correctly Electronic Signature(s) Signed: 05/05/2022 5:03:29 PM By: Baruch Gouty RN, BSN Entered By: Baruch Gouty on 05/05/2022 10:14:06 -------------------------------------------------------------------------------- Wound Assessment Details Patient Name: Date of Service: [name], [name] 05/05/2022 9:00 A M Medical Record Number: [id number] Patient Account Number: [id number] Date of Birth/Sex: Treating RN: 04-20-1936 (86 y.o. [name] Primary Care Tamar Miano: [name] Other Clinician: Referring Bernece Gall: Treating Detric Scalisi/Extender: [name] in Treatment: 0 Wound Status Wound Number: 2 Primary Venous Leg Ulcer Etiology: Wound Location: Left, Posterior Lower Leg Secondary Lymphedema Wounding Event: Gradually Appeared Etiology: Date Acquired: 03/30/2022 Wound Open [name] Of Treatment: 0 Status: Clustered Wound: No Comorbid Glaucoma, Arrhythmia, Coronary Artery Disease, Hypertension, History: Peripheral Venous Disease, Osteoarthritis Photos Wound Measurements Length: (cm) 2.5 Width: (cm) 5.1 Depth: (cm) 0.1 Area: (cm) 10.014 Volume: (cm) 1.001 % Reduction in Area: 0% % Reduction in Volume: 0% Epithelialization: Medium (34-66%) Tunneling: No Undermining: No Wound Description Classification: Full Thickness Without Exposed Support Structures Wound Margin: Indistinct, nonvisible Exudate Amount: Medium Exudate Type: Serous Exudate Color: amber Foul Odor After Cleansing: No Slough/Fibrino Yes Wound Bed Granulation Amount: Small (1-33%) Exposed Structure Granulation Quality: Pink Fascia Exposed: No Necrotic Amount:  Large (67-100%) Fat Layer (Subcutaneous Tissue) Exposed: Yes Necrotic Quality: Adherent Slough Tendon Exposed: No Muscle Exposed: No Joint Exposed: No Bone Exposed: No Electronic Signature(s) Signed: 05/05/2022 5:03:29 PM By: Baruch Gouty RN, BSN Entered By: Baruch Gouty on 05/05/2022  09:51:53 -------------------------------------------------------------------------------- [place] Details Patient Name: Date of Service: [name], [name] 05/05/2022 9:00 A M Medical Record Number: [id number] Patient Account Number: [id number] Date of Birth/Sex: Treating RN: [birth year] (86 y.o. [name] Primary Care Teauna Dubach: [name] Other Clinician: Referring Akaysha Cobern: Treating Tery Hoeger/Extender: [name] in Treatment: 0 Vital Signs Time Taken: 09:28 Temperature (F): 97.7 Height (in): 65 Pulse (bpm): 45 Source: Stated Respiratory Rate (breaths/min): 18 Weight (lbs): 195 Blood Pressure (mmHg): 116/68 Source: Stated Reference Range: 80 - 120 mg / dl Body Mass Index (BMI): 32.4 Electronic Signature(s) Signed: 05/05/2022 5:03:29 PM By: Baruch Gouty RN, BSN Entered By: Baruch Gouty on 05/05/2022 09:30:32

## 2022-05-06 NOTE — Progress Notes (Signed)
TOWNSEND, CUDWORTH ([id number]) Visit Report for 05/05/2022 Abuse Risk Screen Details Patient Name: Date of Service: [name], [name] 05/05/2022 9:00 A M Medical Record Number: [id number] Patient Account Number: [id number] Date of Birth/Sex: Treating RN: May 01, 1936 (86 y.o. [name] Primary Care Katelyn Kohlmeyer: [name] Other Clinician: Referring Sae Handrich: Treating Berlin Mokry/Extender: [name] in Treatment: 0 Abuse Risk Screen Items Answer ABUSE RISK SCREEN: Has anyone close to you tried to hurt or harm you recentlyo No Do you feel uncomfortable with anyone in your familyo No Has anyone forced you do things that you didnt want to doo No Electronic Signature(s) Signed: 05/05/2022 5:03:29 PM By: Baruch Gouty RN, BSN Entered By: Baruch Gouty on 05/05/2022 09:38:06 -------------------------------------------------------------------------------- Activities of Daily Living Details Patient Name: Date of Service: [name], [name] 05/05/2022 9:00 [place] Record Number: [id number] Patient Account Number: [id number] Date of Birth/Sex: Treating RN: 1936-03-13 (86 y.o. [name] Primary Care Juliana Boling: [name] Other Clinician: Referring Eraina Winnie: Treating Lindsi Bayliss/Extender: [name] in Treatment: 0 Activities of Daily Living Items Answer Activities of Daily Living (Please select one for each item) Drive Automobile Not Able T Medications ake Completely Able Use T elephone Completely Able Care for Appearance Completely Able Use T oilet Completely Able Bath / Shower Completely Able Dress Self Completely Able Feed Self Completely Able Walk Need Assistance Get In / Out Bed Completely Able Housework Completely Able Prepare Meals Completely Olinda for Self Need Assistance Electronic Signature(s) Signed: 05/05/2022 5:03:29 PM By: Baruch Gouty RN, BSN Entered By: Baruch Gouty on  05/05/2022 09:38:51 -------------------------------------------------------------------------------- Education Screening Details Patient Name: Date of Service: [name], [name] 05/05/2022 9:00 A M Medical Record Number: [id number] Patient Account Number: [id number] Date of Birth/Sex: Treating RN: Feb 18, 1936 (86 y.o. [name] Primary Care Verdia Bolt: [name] Other Clinician: Referring Xaidyn Kepner: Treating Trace Cederberg/Extender: [name] in Treatment: 0 Primary Learner Assessed: Patient Learning Preferences/Education Level/Primary Language Learning Preference: Explanation, Demonstration, Printed Material Highest Education Level: College or Above Preferred Language: English Cognitive Barrier Language Barrier: No Translator Needed: No Memory Deficit: No Emotional Barrier: No Cultural/Religious Beliefs Affecting Medical Care: No Physical Barrier Impaired Vision: Yes Glasses Impaired Hearing: No Decreased Hand dexterity: No Knowledge/Comprehension Knowledge Level: High Comprehension Level: High Ability to understand written instructions: High Ability to understand verbal instructions: High Motivation Anxiety Level: Calm Cooperation: Cooperative Education Importance: Acknowledges Need Interest in Health Problems: Asks Questions Perception: Coherent Willingness to Engage in Self-Management High Activities: Readiness to Engage in Self-Management High Activities: Electronic Signature(s) Signed: 05/05/2022 5:03:29 PM By: Baruch Gouty RN, BSN Entered By: Baruch Gouty on 05/05/2022 09:39:40 -------------------------------------------------------------------------------- Fall Risk Assessment Details Patient Name: Date of Service: [name], [name] 05/05/2022 9:00 A M Medical Record Number: [id number] Patient Account Number: [id number] Date of Birth/Sex: Treating RN: 16-Dec-1935 (86 y.o. [name] Primary Care Shynice Sigel: [name] Other  Clinician: Referring Tayelor Osborne: Treating Inaya Gillham/Extender: [name] in Treatment: 0 Fall Risk Assessment Items Have you had 2 or more falls in the last 12 monthso 0 No Have you had any fall that resulted in injury in the last 12 monthso 0 No FALLS RISK SCREEN History of falling - immediate or within 3 months 0 No Secondary diagnosis (Do you have 2 or more medical diagnoseso) 0 No Ambulatory aid None/bed rest/wheelchair/nurse 0 No Crutches/cane/walker 15 Yes Furniture 0 No Intravenous therapy Access/Saline/Heparin Lock 0 No Gait/Transferring  Normal/ bed rest/ wheelchair 0 No Weak (short steps with or without shuffle, stooped but able to lift head while walking, may seek 10 Yes support from furniture) Impaired (short steps with shuffle, may have difficulty arising from chair, head down, impaired 0 No balance) Mental Status Oriented to own ability 0 Yes Electronic Signature(s) Signed: 05/05/2022 5:03:29 PM By: Baruch Gouty RN, BSN Entered By: Baruch Gouty on 05/05/2022 09:40:06 -------------------------------------------------------------------------------- Foot Assessment Details Patient Name: Date of Service: [name], [name] 05/05/2022 9:00 [place] Record Number: [id number] Patient Account Number: [id number] Date of Birth/Sex: Treating RN: 1936-02-23 (86 y.o. [name] Primary Care Phinneas Shakoor: [name] Other Clinician: Referring Tatym Schermer: Treating Miquel Lamson/Extender: [name] in Treatment: 0 Foot Assessment Items Site Locations + = Sensation present, - = Sensation absent, C = Callus, U = Ulcer R = Redness, W = Warmth, M = Maceration, PU = Pre-ulcerative lesion F = Fissure, S = Swelling, D = Dryness Assessment Right: Left: Other Deformity: No No Prior Foot Ulcer: No No Prior Amputation: No No Charcot Joint: No No Ambulatory Status: Ambulatory With Help Assistance Device: Cane Gait: Steady Electronic  Signature(s) Signed: 05/05/2022 5:03:29 PM By: Baruch Gouty RN, BSN Entered By: Baruch Gouty on 05/05/2022 09:42:30 -------------------------------------------------------------------------------- Nutrition Risk Screening Details Patient Name: Date of Service: [name], [name] 05/05/2022 9:00 A M Medical Record Number: [id number] Patient Account Number: [id number] Date of Birth/Sex: Treating RN: 1936-04-26 (86 y.o. [name] Primary Care Aubrey Voong: [name] Other Clinician: Referring Chrishawna Farina: Treating Marguis Mathieson/Extender: [name] in Treatment: 0 Height (in): 65 Weight (lbs): 195 Body Mass Index (BMI): 32.4 Nutrition Risk Screening Items Score Screening NUTRITION RISK SCREEN: I have an illness or condition that made me change the kind and/or amount of food I eat 0 No I eat fewer than two meals per day 0 No I eat few fruits and vegetables, or milk products 0 No I have three or more drinks of beer, liquor or wine almost every day 0 No I have tooth or mouth problems that make it hard for me to eat 0 No I don't always have enough money to buy the food I need 0 No I eat alone most of the time 0 No I take three or more different prescribed or over-the-counter drugs a day 1 Yes Without wanting to, I have lost or gained 10 pounds in the last six months 0 No I am not always physically able to shop, cook and/or feed myself 0 No Nutrition Protocols Good Risk Protocol 0 No interventions needed Moderate Risk Protocol High Risk Proctocol Risk Level: Good Risk Score: 1 Electronic Signature(s) Signed: 05/05/2022 5:03:29 PM By: Baruch Gouty RN, BSN Entered By: Baruch Gouty on 05/05/2022 09:40:26

## 2022-05-07 DIAGNOSIS — C44529 Squamous cell carcinoma of skin of other part of trunk: Secondary | ICD-10-CM | POA: Diagnosis not present

## 2022-05-07 DIAGNOSIS — L821 Other seborrheic keratosis: Secondary | ICD-10-CM | POA: Diagnosis not present

## 2022-05-07 DIAGNOSIS — D485 Neoplasm of uncertain behavior of skin: Secondary | ICD-10-CM | POA: Diagnosis not present

## 2022-05-11 ENCOUNTER — Ambulatory Visit: Payer: Medicare Other | Admitting: Internal Medicine

## 2022-05-11 ENCOUNTER — Encounter (HOSPITAL_BASED_OUTPATIENT_CLINIC_OR_DEPARTMENT_OTHER): Payer: Medicare Other | Admitting: General Surgery

## 2022-05-11 ENCOUNTER — Ambulatory Visit (INDEPENDENT_AMBULATORY_CARE_PROVIDER_SITE_OTHER): Payer: Medicare Other | Admitting: Pharmacist

## 2022-05-11 DIAGNOSIS — E039 Hypothyroidism, unspecified: Secondary | ICD-10-CM

## 2022-05-11 DIAGNOSIS — I2510 Atherosclerotic heart disease of native coronary artery without angina pectoris: Secondary | ICD-10-CM | POA: Diagnosis not present

## 2022-05-11 DIAGNOSIS — Z7901 Long term (current) use of anticoagulants: Secondary | ICD-10-CM | POA: Diagnosis not present

## 2022-05-11 DIAGNOSIS — I872 Venous insufficiency (chronic) (peripheral): Secondary | ICD-10-CM | POA: Diagnosis not present

## 2022-05-11 DIAGNOSIS — L97222 Non-pressure chronic ulcer of left calf with fat layer exposed: Secondary | ICD-10-CM | POA: Diagnosis not present

## 2022-05-11 DIAGNOSIS — I10 Essential (primary) hypertension: Secondary | ICD-10-CM

## 2022-05-11 DIAGNOSIS — E785 Hyperlipidemia, unspecified: Secondary | ICD-10-CM | POA: Diagnosis not present

## 2022-05-11 DIAGNOSIS — I480 Paroxysmal atrial fibrillation: Secondary | ICD-10-CM | POA: Diagnosis not present

## 2022-05-11 DIAGNOSIS — I482 Chronic atrial fibrillation, unspecified: Secondary | ICD-10-CM

## 2022-05-11 DIAGNOSIS — L97822 Non-pressure chronic ulcer of other part of left lower leg with fat layer exposed: Secondary | ICD-10-CM | POA: Diagnosis not present

## 2022-05-11 DIAGNOSIS — Z87891 Personal history of nicotine dependence: Secondary | ICD-10-CM | POA: Diagnosis not present

## 2022-05-11 DIAGNOSIS — Z79899 Other long term (current) drug therapy: Secondary | ICD-10-CM | POA: Diagnosis not present

## 2022-05-11 DIAGNOSIS — E782 Mixed hyperlipidemia: Secondary | ICD-10-CM

## 2022-05-11 NOTE — Chronic Care Management (AMB) (Signed)
Chronic Care Management Pharmacy Note  05/11/2022 Name:  [name] MRN:  [id number] DOB:  1935-12-15  Summary: Patient has filled medications on time for 2023. Reviewed how to take furosemide - take second dose prior to 2pm.  Patient has reached  Medicare coverage gap and cost of Eliquis is high. Screened for medication assistance program with [place] but household income did not meet program requirement. Provided #2 weeks of Eliquis samples.  Patient has met medication related goals for Chronic Care Management. Close out pharmacy related goals. He is aware he can contact Clinical Pharmacist Practitioner for medication related issues in the future when they arise.    Subjective: [name] is an 86 y.o. year old male who is a primary patient of Paz, Alda Berthold, MD.  The CCM team was consulted for assistance with disease management and care coordination needs.    Engaged with patient by telephone for follow up visit in response to provider referral for pharmacy case management and/or care coordination services.   Consent to Services:  The patient was given information about Chronic Care Management services, agreed to services, and gave verbal consent prior to initiation of services.  Please see initial visit note for detailed documentation.   Patient Care Team: Colon Branch, MD as PCP - General (Internal Medicine) Josue Hector, MD as Consulting Physician (Cardiology) Allyn Kenner, MD (Dermatology) Gaynelle Arabian, MD as Consulting Physician (Orthopedic Surgery) Newman Pies, MD as Consulting Physician (Neurosurgery) Marygrace Drought, MD as Consulting Physician (Ophthalmology)  Recent office visits: 11/10/2021 - Int Med (Dr Larose Kells) CPX. Noted gait changes, shuffling feet. No med changes. Referred to PT for strengthening and gait changes. Also referred to neurology for consult regarding gait changes.  08/04/2021 - Internal Med (Dr Larose Kells) General follow up. No med changes  noted.   Recent consult visits: 05/05/2022 - Surgeon (Dr Celine Ahr) Seen for f/u lower extremity wound (left)  04/24/2022 - Cardio Ambrose Pancoast, NP) Seen for pre-op for VP shunt placement / cardiac clearance. Recommended holding Eliquis for 2 to 3 days prior to surgery; Prefer to continue ASA unless surgeon feels bleeding risk is too elevated. 01/06/2022 - Cardio (Dr Johnsie Cancel) F/U afib and CAD. No med changes noted.  03/16/2021 - Wound Care (Dr Heber Earl) f/u wound on right lower extremity. Completed therapy. wound has healed.    Hospital visits: None in last 6 months.    Objective:  Lab Results  Component Value Date   CREATININE 1.06 08/04/2021   CREATININE 0.98 04/07/2021   CREATININE 0.9 03/27/2021    Lab Results  Component Value Date   HGBA1C 6.1 11/10/2021   Last diabetic Eye exam:  Lab Results  Component Value Date/Time   HMDIABEYEEXA No Retinopathy 07/10/2016 12:00 AM    Last diabetic Foot exam: No results found for: "HMDIABFOOTEX"      Component Value Date/Time   CHOL 131 08/04/2021 1131   CHOL 161 10/31/2014 0824   TRIG 108.0 08/04/2021 1131   TRIG 81 10/31/2014 0824   TRIG 86 07/23/2006 1012   HDL 41.10 08/04/2021 1131   HDL 49 10/31/2014 0824   CHOLHDL 3 08/04/2021 1131   VLDL 21.6 08/04/2021 1131   LDLCALC 68 08/04/2021 1131   LDLCALC 96 10/31/2014 0824       Latest Ref Rng & Units 08/04/2021   11:31 AM 04/07/2021   11:48 AM 03/27/2021   12:00 AM  Hepatic Function  Total Protein 6.0 - 8.3 g/dL 6.4  6.3  Albumin 3.5 - 5.2 g/dL 4.1  3.9    AST 0 - 37 U/L 21  22  55      ALT 0 - 53 U/L $Remo'18  19  30      'mzJYR$ Alk Phosphatase 39 - 117 U/L 80  71  86      Total Bilirubin 0.2 - 1.2 mg/dL 0.5  0.7       This result is from an external source.    Lab Results  Component Value Date/Time   TSH 3.92 11/10/2021 02:56 PM   TSH 1.99 08/04/2021 11:31 AM       Latest Ref Rng & Units 11/10/2021    2:56 PM 04/07/2021   11:48 AM 03/27/2021   12:00 AM  CBC  WBC 4.0 - 10.5  K/uL 7.4  7.1  6.1      Hemoglobin 13.0 - 17.0 g/dL 14.9  14.6  15.5      Hematocrit 39.0 - 52.0 % 45.0  43.7  45      Platelets 150.0 - 400.0 K/uL 219.0  317.0  170         This result is from an external source.    Lab Results  Component Value Date/Time   VD25OH 31.5 03/27/2021 12:00 AM    Clinical ASCVD: Yes  The ASCVD Risk score (Arnett DK, et al., 2019) failed to calculate for the following reasons:   The 2019 ASCVD risk score is only valid for ages 79 to 93    Other: CHADS2VASc = 4  Social History   Tobacco Use  Smoking Status Former   Types: Cigarettes   Quit date: 09/21/1984   Years since quitting: 37.6  Smokeless Tobacco Never  Tobacco Comments   smoked 1958-1986, up to 1 ppd   BP Readings from Last 3 Encounters:  02/27/22 128/79  02/05/22 (!) 145/73  01/06/22 130/74   Pulse Readings from Last 3 Encounters:  02/27/22 64  02/05/22 74  01/06/22 76   Wt Readings from Last 3 Encounters:  02/05/22 201 lb 8 oz (91.4 kg)  01/06/22 201 lb (91.2 kg)  11/10/21 199 lb 4 oz (90.4 kg)    Assessment: Review of patient past medical history, allergies, medications, health status, including review of consultants reports, laboratory and other test data, was performed as part of comprehensive evaluation and provision of chronic care management services.   SDOH:  (Social Determinants of Health) assessments and interventions performed:  SDOH Interventions    Flowsheet Row Most Recent Value  SDOH Interventions   Financial Strain Interventions Other (Comment)  [assessed for MAP - income did not qualify. Provided 2 weeks of samples.]  Transportation Interventions Intervention Not Indicated        CCM Care Plan  Allergies  Allergen Reactions   Amlodipine Besylate Other (See Comments)    REACTION: edema    Medications Reviewed Today     Reviewed by Cherre Robins, RPH-CPP (Pharmacist) on 05/11/22 at [place] List Status: <None>   Medication Order Taking? Sig  Documenting Provider Last Dose Status Informant  apixaban (ELIQUIS) 5 MG TABS tablet [id number] Yes TAKE 1 TABLET BY MOUTH TWICE DAILY Josue Hector, MD Taking Active   aspirin 81 MG tablet [id number] Yes Take 1 tablet (81 mg total) by mouth daily.  Patient taking differently: Take 81 mg by mouth daily. bedtime   Josue Hector, MD Taking Active   atorvastatin (LIPITOR) 40 MG tablet [id number] Yes TAKE 1 AND 1/2 TABLETS(60 MG) BY  MOUTH AT BEDTIME Colon Branch, MD Taking Active   benazepril (LOTENSIN) 20 MG tablet [id number] Yes TAKE 1 AND 1/2 TABLETS(30 MG) BY MOUTH DAILY Colon Branch, MD Taking Active   diltiazem (DILACOR XR) 180 MG 24 hr capsule [id number] Yes TAKE 1 CAPSULE(180 MG) BY MOUTH DAILY Colon Branch, MD Taking Active   furosemide (LASIX) 40 MG tablet [id number] Yes TAKE 1 AND 1/2 TABLETS(60 MG) BY MOUTH TWICE DAILY  Patient taking differently: Take 60 mg by mouth daily.   Josue Hector, MD Taking Active   levothyroxine (SYNTHROID) 175 MCG tablet [id number] Yes Take 1 tablet (175 mcg total) by mouth daily. Colon Branch, MD Taking Active   LUMIGAN 0.01 % Bailey Mech [id number] Yes Place 1 drop into both eyes at bedtime. [provider] Taking Active Self           Med Note Alinda Money, [place] May 04, 2016  1:32 PM)    metoprolol tartrate (LOPRESSOR) 25 MG tablet [id number] Yes TAKE 1/2 TABLET(12.5 MG) BY MOUTH TWICE DAILY Colon Branch, MD Taking Active   nitroGLYCERIN (NITROSTAT) 0.4 MG SL tablet [id number]  PLACE 1 TABLET UNDER THE TONGUE EVERY 5 MINUTES AS NEEDED FOR CHEST PAIN Josue Hector, MD  Active            Med Note (CANTER, Vilma Prader D   Mon Feb 24, 2021 11:00 AM) PRN  Timolol Maleate 0.5 % (DAILY) SOLN [id number] Yes Apply 1 drop to eye in the morning and at bedtime. At breakfast and evening meals [provider] Taking Active             Patient Active Problem List   Diagnosis Date Noted   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[place]) 02/05/2022   Gait disturbance  02/05/2022   Benign essential tremor 02/05/2022   Chronic anticoagulation 02/05/2022   Atrial fibrillation ([place]) 02/08/2019   Lumbar stenosis with neurogenic claudication 07/13/2016   Annual physical exam>>>>>>>>>>>>>>>>>> 05/04/2016   PCP NOTES>>>>>>>>>>>>>>>>>>>>>>>>>>> 12/31/2015   Edema 02/19/2015   OA (osteoarthritis) of knee 06/25/2014   Unspecified sleep apnea 06/18/2014   Multiple thyroid nodules    Tremor    Carotid artery disease ([place])    Erectile dysfunction 05/31/2012   Coronary artery disease    Venous insufficiency    Hyperglycemia 11/09/2008   MELANOMA, HX OF 11/09/2008   COLONIC POLYPS, HYPERPLASTIC 05/10/2008   Hypothyroidism 08/10/2007   HYPERLIPIDEMIA 08/10/2007   Essential hypertension 08/10/2007   DJD (degenerative joint disease) 08/08/2007   MURMUR 08/08/2007   BENIGN PROSTATIC HYPERTROPHY, HX OF 08/05/2007    Immunization History  Administered Date(s) Administered   Fluad Quad(high Dose 65+) 08/04/2019, 10/25/2020, 08/04/2021   Influenza Split 08/12/2011, 09/06/2012   Influenza Whole 07/23/2006, 08/10/2007, 06/27/2008, 08/21/2009, 08/19/2010   Influenza, High Dose Seasonal PF 07/20/2013, 09/19/2015, 09/07/2018   Influenza,inj,Quad PF,6+ Mos 07/14/2016   Influenza-Unspecified 06/21/2014, 10/01/2017   PFIZER(Purple Top)SARS-COV-2 Vaccination 11/23/2019, 12/19/2019   PNEUMOCOCCAL CONJUGATE-20 11/10/2021   Pneumococcal Conjugate-13 02/19/2015   Pneumococcal Polysaccharide-23 09/01/2013   Td 09/23/1998   Tdap 09/01/2013   Zoster, Live 09/29/2013    Conditions to be addressed/monitored: Atrial Fibrillation, CAD, HTN, HLD, and glaucoma; edema; h/o DVT; chronic venous insufficiency; hypothyroidism  Care Plan : General Pharmacy (Adult)  Updates made by Cherre Robins, RPH-CPP since 05/11/2022 12:00 AM     Problem: HTN, HLD, Hypothyroidism, Afib   Priority: High  Onset Date: 11/15/2020     Long-Range Goal: Provide education, support and care  coordination  for medication therapy and chronic conditions Completed 05/11/2022  Start Date: 11/15/2020  Recent Progress: On track  Priority: High  Note:   Current Barriers:  Unable to achieve control of TSH recently (improved) Unsure of correct atorvastatin dose (improved) Cost of medication - Medicare Coverage gap  Pharmacist Clinical Goal(s):  Over the next 120 days, patient will achieve adherence to monitoring guidelines and medication adherence to achieve therapeutic efficacy achieve control of TSH as evidenced by upcoming lab adhere to prescribed medication regimen as evidenced by fill dates contact provider office for questions/concerns as evidenced notation of same in electronic health record through collaboration with PharmD and provider.   Interventions: 1:1 collaboration with Colon Branch, MD regarding development and update of comprehensive plan of care as evidenced by provider attestation and co-signature Inter-disciplinary care team collaboration (see longitudinal plan of care) Comprehensive medication review performed; medication list updated in electronic medical record  Hypertension / lower extremity edema:  Goals: blood pressure < 140 / 90; decrease edema Blood pressure controlled; Continues to have occasional edema Current treatment: Benazepril 20mg  1.5 tablets daily Diltiazem XR 180mg  daily Metoprolol Tartrate 0.5 tablet twice daily Furosemide 40mg  tablet - take 1.5 tablet = 60mg  twice a day (per patient he is only taking each morning due to concerns that 2nd dose will increase nighttime urinary frequency Medications previously tried: spironolactone Current home readings: not checking recently Denies hypotensive/hypertensive symptoms Interventions:  Educated on blood pressure  goals and benefits of medications for prevention of heart attack, stroke and kidney damage; Continue to monitor blood pressure  1 to 2 times per week, document, and provide log at future  appointments Recommended to take 2nd dose of furosemide between noon and 3pm.  Hyperlipidemia/CAD: (LDL goal < 70) At goal since atorvastatin dose increased in early 2022 Current treatment: Atorvastatin 40mg  - take 1.5 tablets = 60mg  daily  Aspirin 81mg  daily Nitroglycerin 0.4mg  as needed Medications previously tried: none noted  Interventions:  Reviewed cholesterol goals Discussed diet and exercise Continue current therapy for heart / cholesterol  Pre-Diabetes (A1c goal <6.5%) Controlled Current medications: None Medications previously tried: none noted  Diet: eats little sweets and sugar. Most meals are meat and 2 vegetables (1 starch and 1 non starch) Exericse: none but is active at home - mows, cuts shrubs and other yard work Intervention:  Educated onA1c and blood sugar goals; Recommended goal of 150 minutes of exercise / physical activity per week Limit serving sizes of potatoes, corn, rice and pasta to 1/2 cup  Atrial Fibrillation (Goal: prevent stroke and major bleeding) Controlled  CHADSVASc = 4 Current treatment: Rate control:  Diltiazem XR 180mg  daily Metoprolol Tartrate 1/2 tab twice daily Anticoagulation:  Eliquis 5mg  bid Medications previously tried: none noted  Interventions:  Counseled on importance of adherence to anticoagulant as prescribed Screened for Eliquis patient assistance but household income did not qualify. Provided samples Recommended to continue current medication  Hypothyroidism (Goal: Maintain TSH WNL) At goal Current treatment  Levothyroxine 170mcg daily Medications previously tried: none noted Interventions:  Educated on proper timing of medication 30 minutes before meals or other meds on empty stomach Also reminded to avoid taking multivitamin or calcium containing products within 4 hours of levothyroxine.   Patient Goals/Self-Care Activities Over the next 120 days, patient will:  take medications as prescribed check blood  pressure 1 to 2 times per week, document, and provide at future appointments Continue to go to physical therapy for strength building and use cane to prevent falls  Continue to check weight daily and monitor swelling in legs. Remember to take 2nd dose of furosemide earlier in the day - between noon and 3pm.   Follow Up Plan: No follow up needed at this time. Patient will contact Clinical Pharmacist Practitioner with medication related needs / questions.          Medication Assistance:  screened for patient assistance program in 2022 for Eliquis but [place] income did not qualify.  Patient's preferred pharmacy is:  [id number] [address], [address] [place] [address] Phone: [phone] Fax: [phone]    Follow Up:  Patient agrees to Care Plan and Follow-up.  Plan: No further follow up required: patient is aware he can contact Clinical Pharmacist Practitioner at anytime in future with medication related concerns/ issues.     Cherre Robins, PharmD Clinical Pharmacist [facility] [phone]

## 2022-05-11 NOTE — Patient Instructions (Signed)
Mr. Anglemyer,  It was a pleasure speaking with you today.  Below is a summary of your health goals and summary of our recent visit. You can also view your updated Chronic Care Management Care plan through your MyChart account.   Patient Goals/Self-Care Activities:  take medications as prescribed check blood pressure 1 to 2 times per week, document, and provide at future appointments Continue to go to physical therapy for strength building and use cane to prevent falls Continue to check weight daily and monitor swelling in legs. Remember to take 2nd dose of furosemide earlier in the day - between noon and 3pm.   As always if you have any questions or concerns especially regarding medications, please feel free to contact me either at the phone number below or with a MyChart message.   Keep up the good work!  Cherre Robins, PharmD Clinical Pharmacist [facility] [place] [facility] [phone] (direct line)  [phone] (main office number)   The patient verbalized understanding of instructions, educational materials, and care plan provided today and agreed to receive a mailed copy of patient instructions, educational materials, and care plan.

## 2022-05-11 NOTE — Progress Notes (Signed)
JOSHWA, HEMRIC ([id number]) Visit Report for 05/11/2022 Chief Complaint Document Details Patient Name: Date of Service: [name], [name] 05/11/2022 11:15 A M Medical Record Number: [id number] Patient Account Number: [id number] Date of Birth/Sex: Treating RN: 1936/04/21 (86 y.o. [name] Primary Care Provider: [name] Other Clinician: Referring Provider: Treating Provider/Extender: [name] in Treatment: 0 Information Obtained from: Patient Chief Complaint Left lower extremity wound Electronic Signature(s) Signed: 05/11/2022 11:53:50 AM By: Fredirick Maudlin MD FACS Entered By: Fredirick Maudlin on 05/11/2022 11:53:49 -------------------------------------------------------------------------------- Debridement Details Patient Name: Date of Service: [name], [name] 05/11/2022 11:15 A M Medical Record Number: [id number] Patient Account Number: [id number] Date of Birth/Sex: Treating RN: 11/11/1935 (86 y.o. [name] Primary Care Provider: [name] Other Clinician: Referring Provider: Treating Provider/Extender: [name] in Treatment: 0 Debridement Performed for Assessment: Wound #2 Left,Posterior Lower Leg Performed By: Physician Fredirick Maudlin, MD Debridement Type: Debridement Severity of Tissue Pre Debridement: Fat layer exposed Level of Consciousness (Pre-procedure): Awake and Alert Pre-procedure Verification/Time Out Yes - 11:43 Taken: Start Time: 11:44 Pain Control: Lidocaine 5% topical ointment T Area Debrided (L x W): otal 2 (cm) x 4.8 (cm) = 9.6 (cm) Tissue and other material debrided: Non-Viable, Eschar, Slough, Slough Level: Non-Viable Tissue Debridement Description: Selective/Open Wound Instrument: Curette Bleeding: Minimum Hemostasis Achieved: Pressure Procedural Pain: 0 Post Procedural Pain: 0 Response to Treatment: Procedure was tolerated well Level of Consciousness (Post- Awake and  Alert procedure): Post Debridement Measurements of Total Wound Length: (cm) 2 Width: (cm) 4.8 Depth: (cm) 0.1 Volume: (cm) 0.754 Character of Wound/Ulcer Post Debridement: Improved Severity of Tissue Post Debridement: Fat layer exposed Post Procedure Diagnosis Same as Pre-procedure Electronic Signature(s) Signed: 05/11/2022 11:58:59 AM By: Fredirick Maudlin MD FACS Signed: 05/11/2022 5:22:00 PM By: Blanche East RN Entered By: Blanche East on 05/11/2022 11:46:07 -------------------------------------------------------------------------------- HPI Details Patient Name: Date of Service: [name], [name] 05/11/2022 11:15 A M Medical Record Number: [id number] Patient Account Number: [id number] Date of Birth/Sex: Treating RN: [birth year] (86 y.o. [name] Primary Care Provider: [name] Other Clinician: Referring Provider: Treating Provider/Extender: [name] in Treatment: 0 History of Present Illness HPI Description: [name] is an 86 year old male with a past medical history of CAD s/p DES to LCx 2006, carotid artery disease, hypertension, hyperlipidemia, paroxysmal atrial fibrillation on Eliquis and Cardizem that presents today for right lower extremity wound. He was seen by Dr. Larose Kells, his primary care provider on 4/29 for this issue and was referred to our clinic. Patient states that he had a small wound that spontaneously started in October 2021 and has not healed. He states this has progressively gotten larger. He has been using acne cream prescribed by the dermatologist for this issue. He reports drainage to the wound but no purulent drainage. He reports mild soreness to the wound. He has swelling in his right leg greater than left and reports this is a chronic issue for the past 1 to 2 years. He denies resting leg pain or pain with ambulation. Of note He was prescribed doxycycline at the end of last month and has finished this course for possible  skin infection to the wound site. 02/06/2021; I am seeing this patient who was admitted to the clinic last week by Dr. Heber [place]. He has predominantly I think a venous insufficiency wound on the right medial lower leg he has been using Santyl Hydrofera Blue under 3 layer compression. Arterial studies  were ordered last week but do not seem to been put through. He is not a diabetic. He did have venous reflux studies done in August 2020. He did have abnormal reflux time was noted in the great saphenous vein in the distal thigh and the great saphenous vein at the knee. There was no evidence of DVT or SVT at the time he was not felt to have large enough saphenous veins on either side for intervention. Compression stockings at least knee-high were recommended. Follow-up was on a as needed basis with Dr. Donzetta Matters The patient does not describe claudication but his pulses in his feet are not vibrant this could be because of the swelling 5/26; patient presents for 1 week follow-up. He has been using Hydrofera Blue under 3 layer compression. He had arterial studies done. He has no issues or complaints today. He denies signs of infection. He has his juxta light compressions with him today. 6/3; patient presents for 1 week follow-up. He has been using Hydrofera Blue under 3 layer compression. He denies signs and symptoms of infection. He did have bright green drainage which he states he has not seen before when the dressing was taken off today. He is using his juxta light compression to the left leg. 6/10; patient presents for 1 week follow-up. He has been tolerating Hydrofera Blue with 3 layer compression. He again reports bright green drainage. However he denies any signs of infection. He has been using juxta light compression to the left leg. 6/24; patient presents for 2-week follow-up. He has been tolerating Hydrofera Blue with 3 layer compression. Today he is healed. He brought his juxta lite compression for the  right leg and continues to wear the juxta light compression on the left leg READMISSION 05/05/2022 The patient returns to clinic today with a new wound on his left posterior leg. He has been wearing his juxta lite stockings on a regular basis, but on exam he still has 3+ pitting edema. I suspect he has lymphedema in addition to his known venous reflux. The wounds are scattered geographic wounds with light slough accumulation. They have been applying mupirocin and CeraVe ointment. He does have a VP shunt placement scheduled for next week, but it sounds like his neurosurgeon is not aware of the fact that he has an open wound. 05/11/2022: His VP shunt placement has been postponed while we get his wound to heal. The wounds are all smaller today with just a little eschar and minimal slough. Edema control is good. No concern for infection. Electronic Signature(s) Signed: 05/11/2022 11:54:29 AM By: Fredirick Maudlin MD FACS Entered By: Fredirick Maudlin on 05/11/2022 11:54:29 -------------------------------------------------------------------------------- Physical Exam Details Patient Name: Date of Service: [name], [name] 05/11/2022 11:15 A M Medical Record Number: [id number] Patient Account Number: [id number] Date of Birth/Sex: Treating RN: [birth year] (86 y.o. [name] Primary Care Provider: [name] Other Clinician: Referring Provider: Treating Provider/Extender: [name] in Treatment: 0 Constitutional Slightly hypertensive. . . . No acute distress.Marland Kitchen Respiratory Normal work of breathing on room air.. Notes 05/11/2022: The wounds are all smaller today with just a little eschar and minimal slough. Edema control is good. No concern for infection. Electronic Signature(s) Signed: 05/11/2022 11:55:06 AM By: Fredirick Maudlin MD FACS Entered By: Fredirick Maudlin on 05/11/2022  11:55:06 -------------------------------------------------------------------------------- Physician Orders Details Patient Name: Date of Service: [name], [name] 05/11/2022 11:15 A M Medical Record Number: [id number] Patient Account Number: [id number] Date of Birth/Sex: Treating RN: Feb 13, 1936 (  86 y.o. [name] Primary Care Provider: [name] Other Clinician: Referring Provider: Treating Provider/Extender: [name] in Treatment: 0 Verbal / Phone Orders: No Diagnosis Coding ICD-10 Coding Code Description I87.2 Venous insufficiency (chronic) (peripheral) I48.91 Unspecified atrial fibrillation I10 Essential (primary) hypertension I25.10 Atherosclerotic heart disease of native coronary artery without angina pectoris L97.822 Non-pressure chronic ulcer of other part of left lower leg with fat layer exposed Follow-up Appointments ppointment in 1 week. - Dr. Celine Ahr RM 1 with [facility] Return A Tuesday 05/18/22 @ 1:15pm ppointment in 2 weeks. - Dr. Celine Ahr RM 1 with [facility] Return A Tuesday 05/26/22 @ 12:30pm Anesthetic (In clinic) Topical Lidocaine 5% applied to wound bed Bathing/ Shower/ Hygiene May shower with protection but do not get wound dressing(s) wet. - can purchase cast protector at [facility] or [facility] Edema Control - Lymphedema / SCD / Other Elevate legs to the level of the heart or above for 30 minutes daily and/or when sitting, a frequency of: - throughout the day Avoid standing for long periods of time. Exercise regularly - including ankle circles while sitting Compression stocking or Garment 20-30 mm/Hg pressure to: - right leg daily Wound Treatment Wound #2 - Lower Leg Wound Laterality: Left, Posterior Peri-Wound Care: Sween Lotion (Moisturizing lotion) 1 x Per Week/30 Days Discharge Instructions: Apply moisturizing lotion as directed Prim Dressing: Hydrofera Blue Ready Foam, 2.5 x2.5 in 1 x Per Week/30 Days ary Discharge Instructions: Apply to  wound bed as instructed Secondary Dressing: Woven Gauze Sponge, Non-Sterile 4x4 in 1 x Per Week/30 Days Discharge Instructions: Apply over primary dressing as directed. Compression Wrap: ThreePress (3 layer compression wrap) 1 x Per Week/30 Days Discharge Instructions: Apply three layer compression as directed. Electronic Signature(s) Signed: 05/11/2022 11:58:59 AM By: Fredirick Maudlin MD FACS Entered By: Fredirick Maudlin on 05/11/2022 11:55:17 -------------------------------------------------------------------------------- Problem List Details Patient Name: Date of Service: [name], [name] 05/11/2022 11:15 A M Medical Record Number: [id number] Patient Account Number: [id number] Date of Birth/Sex: Treating RN: Jun 09, 1936 (86 y.o. [name] Primary Care Provider: [name] Other Clinician: Referring Provider: Treating Provider/Extender: [name] in Treatment: 0 Active Problems ICD-10 Encounter Code Description Active Date MDM Diagnosis I87.2 Venous insufficiency (chronic) (peripheral) 05/05/2022 No Yes I48.91 Unspecified atrial fibrillation 05/05/2022 No Yes I10 Essential (primary) hypertension 05/05/2022 No Yes I25.10 Atherosclerotic heart disease of native coronary artery without angina pectoris 05/05/2022 No Yes L97.822 Non-pressure chronic ulcer of other part of left lower leg with fat layer exposed8/15/2023 No Yes Inactive Problems Resolved Problems Electronic Signature(s) Signed: 05/11/2022 11:48:11 AM By: Fredirick Maudlin MD FACS Entered By: Fredirick Maudlin on 05/11/2022 11:48:11 -------------------------------------------------------------------------------- Progress Note Details Patient Name: Date of Service: [name], [name] 05/11/2022 11:15 A M Medical Record Number: [id number] Patient Account Number: [id number] Date of Birth/Sex: Treating RN: 08-24-1936 (85 y.o. [name] Primary Care Provider: [name] Other  Clinician: Referring Provider: Treating Provider/Extender: [name] in Treatment: 0 Subjective Chief Complaint Information obtained from Patient Left lower extremity wound History of Present Illness (HPI) [name] is an 86 year old male with a past medical history of CAD s/p DES to LCx 2006, carotid artery disease, hypertension, hyperlipidemia, paroxysmal atrial fibrillation on Eliquis and Cardizem that presents today for right lower extremity wound. He was seen by Dr. Larose Kells, his primary care provider on 4/29 for this issue and was referred to our clinic. Patient states that he had a small wound that spontaneously started in October  2021 and has not healed. He states this has progressively gotten larger. He has been using acne cream prescribed by the dermatologist for this issue. He reports drainage to the wound but no purulent drainage. He reports mild soreness to the wound. He has swelling in his right leg greater than left and reports this is a chronic issue for the past 1 to 2 years. He denies resting leg pain or pain with ambulation. Of note He was prescribed doxycycline at the end of last month and has finished this course for possible skin infection to the wound site. 02/06/2021; I am seeing this patient who was admitted to the clinic last week by Dr. Heber [place]. He has predominantly I think a venous insufficiency wound on the right medial lower leg he has been using Santyl Hydrofera Blue under 3 layer compression. Arterial studies were ordered last week but do not seem to been put through. He is not a diabetic. He did have venous reflux studies done in August 2020. He did have abnormal reflux time was noted in the great saphenous vein in the distal thigh and the great saphenous vein at the knee. There was no evidence of DVT or SVT at the time he was not felt to have large enough saphenous veins on either side for intervention. Compression stockings at least knee-high  were recommended. Follow-up was on a as needed basis with Dr. Donzetta Matters The patient does not describe claudication but his pulses in his feet are not vibrant this could be because of the swelling 5/26; patient presents for 1 week follow-up. He has been using Hydrofera Blue under 3 layer compression. He had arterial studies done. He has no issues or complaints today. He denies signs of infection. He has his juxta light compressions with him today. 6/3; patient presents for 1 week follow-up. He has been using Hydrofera Blue under 3 layer compression. He denies signs and symptoms of infection. He did have bright green drainage which he states he has not seen before when the dressing was taken off today. He is using his juxta light compression to the left leg. 6/10; patient presents for 1 week follow-up. He has been tolerating Hydrofera Blue with 3 layer compression. He again reports bright green drainage. However he denies any signs of infection. He has been using juxta light compression to the left leg. 6/24; patient presents for 2-week follow-up. He has been tolerating Hydrofera Blue with 3 layer compression. Today he is healed. He brought his juxta lite compression for the right leg and continues to wear the juxta light compression on the left leg READMISSION 05/05/2022 The patient returns to clinic today with a new wound on his left posterior leg. He has been wearing his juxta lite stockings on a regular basis, but on exam he still has 3+ pitting edema. I suspect he has lymphedema in addition to his known venous reflux. The wounds are scattered geographic wounds with light slough accumulation. They have been applying mupirocin and CeraVe ointment. He does have a VP shunt placement scheduled for next week, but it sounds like his neurosurgeon is not aware of the fact that he has an open wound. 05/11/2022: His VP shunt placement has been postponed while we get his wound to heal. The wounds are all smaller  today with just a little eschar and minimal slough. Edema control is good. No concern for infection. Patient History Information obtained from Patient. Family History Cancer - Father, Diabetes - Maternal Grandparents, Heart Disease - Maternal Grandparents,  Hypertension - Maternal Grandparents, No family history of Hereditary Spherocytosis, Kidney Disease, Lung Disease, Seizures, Stroke, Thyroid Problems, Tuberculosis. Social History Former smoker - Quit over 30 years ago, Marital Status - Married, Alcohol Use - Rarely, Drug Use - No History, Caffeine Use - Daily. Medical History Eyes Patient has history of Glaucoma Cardiovascular Patient has history of Arrhythmia - afib, Coronary Artery Disease, Hypertension, Peripheral Venous Disease Endocrine Denies history of Type I Diabetes, Type II Diabetes Genitourinary Denies history of End Stage Renal Disease Integumentary (Skin) Denies history of History of Burn Musculoskeletal Patient has history of Osteoarthritis Oncologic Denies history of Received Chemotherapy, Received Radiation Psychiatric Denies history of Anorexia/bulimia, Confinement Anxiety Hospitalization/Surgery History - lumbar laminectomy. - total knee arthropathy right. - bil total hip arthroplasty. - coronary stents. - toe surgery. - tonsillectomy. Medical A Surgical History Notes nd Gastrointestinal colon polyps Endocrine Hypothyroidism Genitourinary BPH Musculoskeletal Bilat Hip Replacements, Right Knee Replacement, lumbar stenosis Neurologic hydrocephalus Oncologic Skin Cancer multiple sites Objective Constitutional Slightly hypertensive. No acute distress.. Vitals Time Taken: 11:23 AM, Height: 65 in, Weight: 195 lbs, BMI: 32.4, Temperature: 97.6 F, Pulse: 69 bpm, Respiratory Rate: 20 breaths/min, Blood Pressure: 146/82 mmHg. Respiratory Normal work of breathing on room air.. General Notes: 05/11/2022: The wounds are all smaller today with just a little  eschar and minimal slough. Edema control is good. No concern for infection. Integumentary (Hair, Skin) Wound #2 status is Open. Original cause of wound was Gradually Appeared. The date acquired was: 03/30/2022. The wound is located on the Left,Posterior Lower Leg. The wound measures 2cm length x 4.8cm width x 0.1cm depth; 7.54cm^2 area and 0.754cm^3 volume. There is Fat Layer (Subcutaneous Tissue) exposed. There is no tunneling or undermining noted. There is a medium amount of serous drainage noted. The wound margin is indistinct and nonvisible. There is medium (34-66%) pink granulation within the wound bed. There is a medium (34-66%) amount of necrotic tissue within the wound bed including Adherent Slough. Assessment Active Problems ICD-10 Venous insufficiency (chronic) (peripheral) Unspecified atrial fibrillation Essential (primary) hypertension Atherosclerotic heart disease of native coronary artery without angina pectoris Non-pressure chronic ulcer of other part of left lower leg with fat layer exposed Procedures Wound #2 Pre-procedure diagnosis of Wound #2 is a Venous Leg Ulcer located on the Left,Posterior Lower Leg .Severity of Tissue Pre Debridement is: Fat layer exposed. There was a Selective/Open Wound Non-Viable Tissue Debridement with a total area of 9.6 sq cm performed by Fredirick Maudlin, MD. With the following instrument(s): Curette to remove Non-Viable tissue/material. Material removed includes Eschar and Slough and after achieving pain control using Lidocaine 5% topical ointment. No specimens were taken. A time out was conducted at 11:43, prior to the start of the procedure. A Minimum amount of bleeding was controlled with Pressure. The procedure was tolerated well with a pain level of 0 throughout and a pain level of 0 following the procedure. Post Debridement Measurements: 2cm length x 4.8cm width x 0.1cm depth; 0.754cm^3 volume. Character of Wound/Ulcer Post Debridement is  improved. Severity of Tissue Post Debridement is: Fat layer exposed. Post procedure Diagnosis Wound #2: Same as Pre-Procedure Pre-procedure diagnosis of Wound #2 is a Venous Leg Ulcer located on the Left,Posterior Lower Leg . There was a Three Layer Compression Therapy Procedure by Blanche East, RN. Post procedure Diagnosis Wound #2: Same as Pre-Procedure Plan Follow-up Appointments: Return Appointment in 1 week. - Dr. Celine Ahr RM 1 with [facility] Tuesday 05/18/22 @ 1:15pm Return Appointment in 2 weeks. - Dr. Celine Ahr RM  1 with [facility] Tuesday 05/26/22 @ 12:30pm Anesthetic: (In clinic) Topical Lidocaine 5% applied to wound bed Bathing/ Shower/ Hygiene: May shower with protection but do not get wound dressing(s) wet. - can purchase cast protector at [facility] or [facility] Edema Control - Lymphedema / SCD / Other: Elevate legs to the level of the heart or above for 30 minutes daily and/or when sitting, a frequency of: - throughout the day Avoid standing for long periods of time. Exercise regularly - including ankle circles while sitting Compression stocking or Garment 20-30 mm/Hg pressure to: - right leg daily WOUND #2: - Lower Leg Wound Laterality: Left, Posterior Peri-Wound Care: Sween Lotion (Moisturizing lotion) 1 x Per Week/30 Days Discharge Instructions: Apply moisturizing lotion as directed Prim Dressing: Hydrofera Blue Ready Foam, 2.5 x2.5 in 1 x Per Week/30 Days ary Discharge Instructions: Apply to wound bed as instructed Secondary Dressing: Woven Gauze Sponge, Non-Sterile 4x4 in 1 x Per Week/30 Days Discharge Instructions: Apply over primary dressing as directed. Com pression Wrap: ThreePress (3 layer compression wrap) 1 x Per Week/30 Days Discharge Instructions: Apply three layer compression as directed. 05/11/2022: The wounds are all smaller today with just a little eschar and minimal slough. Edema control is good. No concern for infection. I used a curette to debride eschar and slough from each  of the wounds. We will continue using Hydrofera Blue with 3 layer compression. Follow-up in 1 week. Electronic Signature(s) Signed: 05/11/2022 11:58:01 AM By: Fredirick Maudlin MD FACS Entered By: Fredirick Maudlin on 05/11/2022 11:58:01 -------------------------------------------------------------------------------- HxROS Details Patient Name: Date of Service: [name], [name] 05/11/2022 11:15 A M Medical Record Number: [id number] Patient Account Number: [id number] Date of Birth/Sex: Treating RN: Sep 04, 1936 (86 y.o. [name] Primary Care Provider: [name] Other Clinician: Referring Provider: Treating Provider/Extender: [name] in Treatment: 0 Information Obtained From Patient Eyes Medical History: Positive for: Glaucoma Cardiovascular Medical History: Positive for: Arrhythmia - afib; Coronary Artery Disease; Hypertension; Peripheral Venous Disease Gastrointestinal Medical History: Past Medical History Notes: colon polyps Endocrine Medical History: Negative for: Type I Diabetes; Type II Diabetes Past Medical History Notes: Hypothyroidism Genitourinary Medical History: Negative for: End Stage Renal Disease Past Medical History Notes: BPH Integumentary (Skin) Medical History: Negative for: History of Burn Musculoskeletal Medical History: Positive for: Osteoarthritis Past Medical History Notes: Bilat Hip Replacements, Right Knee Replacement, lumbar stenosis Neurologic Medical History: Past Medical History Notes: hydrocephalus Oncologic Medical History: Negative for: Received Chemotherapy; Received Radiation Past Medical History Notes: Skin Cancer multiple sites Psychiatric Medical History: Negative for: Anorexia/bulimia; Confinement Anxiety HBO Extended History Items Eyes: Glaucoma Immunizations Pneumococcal Vaccine: Received Pneumococcal Vaccination: Yes Received Pneumococcal Vaccination On or After 60th Birthday:  Yes Implantable Devices None Hospitalization / Surgery History Type of Hospitalization/Surgery lumbar laminectomy total knee arthropathy right bil total hip arthroplasty coronary stents toe surgery tonsillectomy Family and Social History Cancer: Yes - Father; Diabetes: Yes - Maternal Grandparents; Heart Disease: Yes - Maternal Grandparents; Hereditary Spherocytosis: No; Hypertension: Yes - Maternal Grandparents; Kidney Disease: No; Lung Disease: No; Seizures: No; Stroke: No; Thyroid Problems: No; Tuberculosis: No; Former smoker - Quit over 30 years ago; Marital Status - Married; Alcohol Use: Rarely; Drug Use: No History; Caffeine Use: Daily; Financial Concerns: No; Food, Clothing or Shelter Needs: No; Support System Lacking: No; Transportation Concerns: No Electronic Signature(s) Signed: 05/11/2022 11:58:59 AM By: Fredirick Maudlin MD FACS Signed: 05/11/2022 5:48:43 PM By: Baruch Gouty RN, BSN Entered By: Fredirick Maudlin on 05/11/2022 11:54:34 -------------------------------------------------------------------------------- SuperBill Details Patient Name:  Date of Service: [name], [name] 05/11/2022 Medical Record Number: [id number] Patient Account Number: [id number] Date of Birth/Sex: Treating RN: 1936/01/16 (86 y.o. 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 Facility Procedures CPT4 Code: [id number] Description: [id number] - DEBRIDE WOUND 1ST 20 SQ CM OR < ICD-10 Diagnosis Description L97.822 Non-pressure chronic  ulcer of other part of left lower leg with fat layer expose Modifier: d Quantity: 1 Physician Procedures : CPT4 Code Description Modifier [id number] - WC PHYS LEVEL 3 - EST PT 25 ICD-10 Diagnosis Description L97.822 Non-pressure chronic ulcer of other part of left lower leg with fat layer exposed I87.2 Venous insufficiency (chronic) (peripheral) I25.10  Atherosclerotic heart disease of native coronary artery without angina pectoris I10 Essential (primary) hypertension Quantity: 1 : [id number] - WC PHYS DEBR WO ANESTH 20 SQ CM ICD-10 Diagnosis Description L97.822 Non-pressure chronic ulcer of other part of left lower leg with fat layer exposed Quantity: 1 Electronic Signature(s) Signed: 05/11/2022 11:58:19 AM By: Fredirick Maudlin MD FACS Entered By: Fredirick Maudlin on 05/11/2022 11:58:19

## 2022-05-11 NOTE — Progress Notes (Signed)
RANKIN, COOLMAN ([id number]) Visit Report for 05/11/2022 Arrival Information Details Patient Name: Date of Service: [name], [place] RD E. 05/11/2022 11:15 A M Medical Record Number: [id number] Patient Account Number: [id number] Date of Birth/Sex: Treating RN: November 22, 1935 (86 y.o. [name] Primary Care Macario Shear: [name] Other Clinician: Referring Zeidy Tayag: Treating Gila Lauf/Extender: [name] in Treatment: 0 Visit Information History Since Last Visit All ordered tests and consults were completed: Yes Patient Arrived: [name] Added or deleted any medications: No Arrival Time: 11:23 Any new allergies or adverse reactions: No Accompanied By: wife Had a fall or experienced change in No Transfer Assistance: None activities of daily living that may affect Patient Identification Verified: Yes risk of falls: Secondary Verification Process Completed: Yes Signs or symptoms of abuse/neglect since last visito No Patient Requires Transmission-Based Precautions: No Hospitalized since last visit: No Patient Has Alerts: Yes Implantable device outside of the clinic excluding No Patient Alerts: R ABI = 1.09, TBI= .77 cellular tissue based products placed in the center L ABI = 1.02, TBI = .83 since last visit: Has Dressing in Place as Prescribed: Yes Has Compression in Place as Prescribed: Yes Pain Present Now: No Electronic Signature(s) Signed: 05/11/2022 5:22:00 PM By: Blanche East RN Entered By: Blanche East on 05/11/2022 11:23:56 -------------------------------------------------------------------------------- Compression Therapy Details Patient Name: Date of Service: [name], [name] 05/11/2022 11:15 A M Medical Record Number: [id number] Patient Account Number: [id number] Date of Birth/Sex: Treating RN: May 01, 1936 (86 y.o. [name] Primary Care Lory Galan: [name] Other Clinician: Referring Findley Blankenbaker: Treating Kafi Dotter/Extender: [name] in Treatment: 0 Compression Therapy Performed for Wound Assessment: Wound #2 Left,Posterior Lower Leg Performed By: Clinician Blanche East, RN Compression Type: Three Layer Post Procedure Diagnosis Same as Pre-procedure Electronic Signature(s) Signed: 05/11/2022 5:22:00 PM By: Blanche East RN Entered By: Blanche East on 05/11/2022 11:46:41 -------------------------------------------------------------------------------- Encounter Discharge Information Details Patient Name: Date of Service: [name], [name] 05/11/2022 11:15 A M Medical Record Number: [id number] Patient Account Number: [id number] Date of Birth/Sex: Treating RN: 1935-12-20 (86 y.o. [name] Primary Care Mads Borgmeyer: [name] Other Clinician: Referring Carsynn Bethune: Treating Sahirah Rudell/Extender: [name] in Treatment: 0 Encounter Discharge Information Items Post Procedure Vitals Discharge Condition: Stable Temperature (F): 97.6 Ambulatory Status: Cane Pulse (bpm): 69 Discharge Destination: Home Respiratory Rate (breaths/min): 20 Transportation: Private Auto Blood Pressure (mmHg): 146/86 Accompanied By: wife Schedule Follow-up Appointment: Yes Clinical Summary of Care: Electronic Signature(s) Signed: 05/11/2022 5:22:00 PM By: Blanche East RN Entered By: Blanche East on 05/11/2022 16:40:22 -------------------------------------------------------------------------------- Lower Extremity Assessment Details Patient Name: Date of Service: [name], [name] 05/11/2022 11:15 A M Medical Record Number: [id number] Patient Account Number: [id number] Date of Birth/Sex: Treating RN: 29-Apr-1936 (86 y.o. [name] Primary Care Anthoney Sheppard: [name] Other Clinician: Referring Marshawn Ninneman: Treating Ara Mano/Extender: [name] in Treatment: 0 Edema Assessment Assessed: [Left: No] [Right: No] E[Left: dema] [Right: :] Calf Left: Right: Point of  Measurement: 30 cm From Medial Instep 37 cm Ankle Left: Right: Point of Measurement: 13 cm From Medial Instep 23 cm Vascular Assessment Pulses: Dorsalis Pedis Palpable: [Left:Yes] Electronic Signature(s) Signed: 05/11/2022 5:22:00 PM By: Blanche East RN Entered By: Blanche East on 05/11/2022 11:32:50 -------------------------------------------------------------------------------- Multi Wound Chart Details Patient Name: Date of Service: [name], [name] 05/11/2022 11:15 A M Medical Record Number: [id number] Patient Account Number: [id number] Date of Birth/Sex: Treating RN: Jan 29, 1936 (86 y.o. [name] Primary Care Kanden Carey:  [name] Other Clinician: Referring Beckey Polkowski: Treating Kristina Mcnorton/Extender: [name] in Treatment: 0 Vital Signs Height(in): 65 Pulse(bpm): 69 Weight(lbs): 195 Blood Pressure(mmHg): 146/82 Body Mass Index(BMI): 32.4 Temperature(F): 97.6 Respiratory Rate(breaths/min): 20 Photos: [N/A:N/A] Left, Posterior Lower Leg N/A N/A Wound Location: Gradually Appeared N/A N/A Wounding Event: Venous Leg Ulcer N/A N/A Primary Etiology: Lymphedema N/A N/A Secondary Etiology: Glaucoma, Arrhythmia, Coronary N/A N/A Comorbid History: Artery Disease, Hypertension, Peripheral Venous Disease, Osteoarthritis 03/30/2022 N/A N/A Date Acquired: 0 N/A N/A Weeks of Treatment: Open N/A N/A Wound Status: No N/A N/A Wound Recurrence: 2x4.8x0.1 N/A N/A Measurements L x W x D (cm) 7.54 N/A N/A A (cm) : rea 0.754 N/A N/A Volume (cm) : 24.70% N/A N/A % Reduction in A rea: 24.70% N/A N/A % Reduction in Volume: Full Thickness Without Exposed N/A N/A Classification: Support Structures Medium N/A N/A Exudate A mount: Serous N/A N/A Exudate Type: amber N/A N/A Exudate Color: Indistinct, nonvisible N/A N/A Wound Margin: Medium (34-66%) N/A N/A Granulation A mount: Pink N/A N/A Granulation Quality: Medium (34-66%) N/A  N/A Necrotic A mount: Fat Layer (Subcutaneous Tissue): Yes N/A N/A Exposed Structures: Fascia: No Tendon: No Muscle: No Joint: No Bone: No Medium (34-66%) N/A N/A Epithelialization: Debridement - Selective/Open Wound N/A N/A Debridement: Pre-procedure Verification/Time Out 11:43 N/A N/A Taken: Lidocaine 5% topical ointment N/A N/A Pain Control: Necrotic/Eschar, Slough N/A N/A Tissue Debrided: Non-Viable Tissue N/A N/A Level: 9.6 N/A N/A Debridement A (sq cm): rea Curette N/A N/A Instrument: Minimum N/A N/A Bleeding: Pressure N/A N/A Hemostasis A chieved: 0 N/A N/A Procedural Pain: 0 N/A N/A Post Procedural Pain: Procedure was tolerated well N/A N/A Debridement Treatment Response: 2x4.8x0.1 N/A N/A Post Debridement Measurements L x W x D (cm) 0.754 N/A N/A Post Debridement Volume: (cm) Compression Therapy N/A N/A Procedures Performed: Debridement Treatment Notes Electronic Signature(s) Signed: 05/11/2022 11:53:44 AM By: Fredirick Maudlin MD FACS Signed: 05/11/2022 5:48:43 PM By: Baruch Gouty RN, BSN Entered By: Fredirick Maudlin on 05/11/2022 11:53:44 -------------------------------------------------------------------------------- Multi-Disciplinary Care Plan Details Patient Name: Date of Service: [name], [name] 05/11/2022 11:15 A M Medical Record Number: [id number] Patient Account Number: [id number] Date of Birth/Sex: Treating RN: [birth year] (86 y.o. [name] Primary Care Pink Maye: [name] Other Clinician: Referring Chima Astorino: Treating Amara Justen/Extender: [name] in Treatment: 0 Active Inactive Electronic Signature(s) Signed: 05/11/2022 5:22:00 PM By: Blanche East RN Entered By: Blanche East on 05/11/2022 11:35:47 -------------------------------------------------------------------------------- Pain Assessment Details Patient Name: Date of Service: [name], [name] 05/11/2022 11:15 A M Medical Record Number:  [id number] Patient Account Number: [id number] Date of Birth/Sex: Treating RN: [birth year] (86 y.o. [name] Primary Care Moriah Loughry: [name] Other Clinician: Referring Naim Murtha: Treating Jenia Klepper/Extender: [name] in Treatment: 0 Active Problems Location of Pain Severity and Description of Pain Patient Has Paino No Site Locations Pain Management and Medication Current Pain Management: Electronic Signature(s) Signed: 05/11/2022 5:22:00 PM By: Blanche East RN Entered By: Blanche East on 05/11/2022 11:26:20 -------------------------------------------------------------------------------- Patient/Caregiver Education Details Patient Name: Date of Service: [name], [name] 8/21/2023andnbsp11:15 A M Medical Record Number: [id number] Patient Account Number: [id number] Date of Birth/Gender: Treating RN: December 18, 1935 (86 y.o. [name] Primary Care Physician: [name] Other Clinician: Referring Physician: Treating Physician/Extender: [name] in Treatment: 0 Education Assessment Education Provided To: Patient Education Topics Provided Wound/Skin Impairment: Methods: Explain/Verbal Responses: Reinforcements needed, State content correctly Electronic Signature(s) Signed: 05/11/2022 5:22:00 PM By: Blanche East RN Entered  ByBlanche East on 05/11/2022 11:36:06 -------------------------------------------------------------------------------- Wound Assessment Details Patient Name: Date of Service: [name], [name] 05/11/2022 11:15 A M Medical Record Number: [id number] Patient Account Number: [id number] Date of Birth/Sex: Treating RN: 1936/05/05 (86 y.o. [name] Primary Care Leib Elahi: [name] Other Clinician: Referring Myrikal Messmer: Treating Laquitta Dominski/Extender: [name] in Treatment: 0 Wound Status Wound Number: 2 Primary Venous Leg Ulcer Etiology: Wound Location: Left, Posterior  Lower Leg Secondary Lymphedema Wounding Event: Gradually Appeared Etiology: Date Acquired: 03/30/2022 Wound Open Weeks Of Treatment: 0 Status: Clustered Wound: No Comorbid Glaucoma, Arrhythmia, Coronary Artery Disease, Hypertension, History: Peripheral Venous Disease, Osteoarthritis Photos Wound Measurements Length: (cm) 2 Width: (cm) 4.8 Depth: (cm) 0.1 Area: (cm) 7.54 Volume: (cm) 0.754 % Reduction in Area: 24.7% % Reduction in Volume: 24.7% Epithelialization: Medium (34-66%) Tunneling: No Undermining: No Wound Description Classification: Full Thickness Without Exposed Support Structures Wound Margin: Indistinct, nonvisible Exudate Amount: Medium Exudate Type: Serous Exudate Color: amber Foul Odor After Cleansing: No Slough/Fibrino Yes Wound Bed Granulation Amount: Medium (34-66%) Exposed Structure Granulation Quality: Pink Fascia Exposed: No Necrotic Amount: Medium (34-66%) Fat Layer (Subcutaneous Tissue) Exposed: Yes Necrotic Quality: Adherent Slough Tendon Exposed: No Muscle Exposed: No Joint Exposed: No Bone Exposed: No Treatment Notes Wound #2 (Lower Leg) Wound Laterality: Left, Posterior Cleanser Peri-Wound Care Sween Lotion (Moisturizing lotion) Discharge Instruction: Apply moisturizing lotion as directed Topical Primary Dressing Hydrofera Blue Ready Foam, 2.5 x2.5 in Discharge Instruction: Apply to wound bed as instructed Secondary Dressing Woven Gauze Sponge, Non-Sterile 4x4 in Discharge Instruction: Apply over primary dressing as directed. Secured With Compression Wrap ThreePress (3 layer compression wrap) Discharge Instruction: Apply three layer compression as directed. Compression Stockings Add-Ons Electronic Signature(s) Signed: 05/11/2022 5:22:00 PM By: Blanche East RN Entered By: Blanche East on 05/11/2022 11:40:13 -------------------------------------------------------------------------------- Vitals Details Patient Name: Date of  Service: [name], [name] 05/11/2022 11:15 A M Medical Record Number: [id number] Patient Account Number: [id number] Date of Birth/Sex: Treating RN: [birth year] (86 y.o. [name] Primary Care Shyna Duignan: [name] Other Clinician: Referring Brea Coleson: Treating Shadman Tozzi/Extender: [name] in Treatment: 0 Vital Signs Time Taken: 11:23 Temperature (F): 97.6 Height (in): 65 Pulse (bpm): 69 Weight (lbs): 195 Respiratory Rate (breaths/min): 20 Body Mass Index (BMI): 32.4 Blood Pressure (mmHg): 146/82 Reference Range: 80 - 120 mg / dl Electronic Signature(s) Signed: 05/11/2022 5:22:00 PM By: Blanche East RN Entered By: Blanche East on 05/11/2022 11:25:47

## 2022-05-14 ENCOUNTER — Inpatient Hospital Stay: Admit: 2022-05-14 | Payer: Medicare Other | Admitting: Neurosurgery

## 2022-05-14 SURGERY — SHUNT INSERTION VENTRICULAR-PERITONEAL
Anesthesia: General | Laterality: Right

## 2022-05-18 ENCOUNTER — Encounter (HOSPITAL_BASED_OUTPATIENT_CLINIC_OR_DEPARTMENT_OTHER): Payer: Medicare Other | Admitting: General Surgery

## 2022-05-18 DIAGNOSIS — Z7901 Long term (current) use of anticoagulants: Secondary | ICD-10-CM | POA: Diagnosis not present

## 2022-05-18 DIAGNOSIS — I872 Venous insufficiency (chronic) (peripheral): Secondary | ICD-10-CM | POA: Diagnosis not present

## 2022-05-18 DIAGNOSIS — Z87891 Personal history of nicotine dependence: Secondary | ICD-10-CM | POA: Diagnosis not present

## 2022-05-18 DIAGNOSIS — E11622 Type 2 diabetes mellitus with other skin ulcer: Secondary | ICD-10-CM | POA: Diagnosis not present

## 2022-05-18 DIAGNOSIS — E785 Hyperlipidemia, unspecified: Secondary | ICD-10-CM | POA: Diagnosis not present

## 2022-05-18 DIAGNOSIS — I10 Essential (primary) hypertension: Secondary | ICD-10-CM | POA: Diagnosis not present

## 2022-05-18 DIAGNOSIS — Z79899 Other long term (current) drug therapy: Secondary | ICD-10-CM | POA: Diagnosis not present

## 2022-05-18 DIAGNOSIS — L97222 Non-pressure chronic ulcer of left calf with fat layer exposed: Secondary | ICD-10-CM | POA: Diagnosis not present

## 2022-05-18 DIAGNOSIS — I480 Paroxysmal atrial fibrillation: Secondary | ICD-10-CM | POA: Diagnosis not present

## 2022-05-18 DIAGNOSIS — L97822 Non-pressure chronic ulcer of other part of left lower leg with fat layer exposed: Secondary | ICD-10-CM | POA: Diagnosis not present

## 2022-05-18 DIAGNOSIS — I2510 Atherosclerotic heart disease of native coronary artery without angina pectoris: Secondary | ICD-10-CM | POA: Diagnosis not present

## 2022-05-19 NOTE — Progress Notes (Signed)
TOWNSEND, CUDWORTH ([id number]) Visit Report for 05/18/2022 Chief Complaint Document Details Patient Name: Date of Service: [name], [name] 05/18/2022 1:00 PM Medical Record Number: [id number] Patient Account Number: [id number] Date of Birth/Sex: Treating RN: [birth year] (86 y.o. [name] Primary Care Provider: [name] Other Clinician: Referring Provider: Treating Provider/Extender: [name] in Treatment: 1 Information Obtained from: Patient Chief Complaint Left lower extremity wound Electronic Signature(s) Signed: 05/18/2022 1:37:47 PM By: Fredirick Maudlin MD FACS Entered By: Fredirick Maudlin on 05/18/2022 13:37:46 -------------------------------------------------------------------------------- Debridement Details Patient Name: Date of Service: [name], [name] 05/18/2022 1:00 PM Medical Record Number: [id number] Patient Account Number: [id number] Date of Birth/Sex: Treating RN: Apr 22, 1936 (86 y.o. [name] Primary Care Provider: [name] Other Clinician: Referring Provider: Treating Provider/Extender: [name] in Treatment: 1 Debridement Performed for Assessment: Wound #2 [place] Lower Leg Performed By: Physician Fredirick Maudlin, MD Debridement Type: Debridement Severity of Tissue Pre Debridement: Fat layer exposed Level of Consciousness (Pre-procedure): Awake and Alert Pre-procedure Verification/Time Out Yes - 13:31 Taken: Start Time: 13:32 Pain Control: Lidocaine 4% T opical Solution T Area Debrided (L x W): otal 1.8 (cm) x 3.8 (cm) = 6.84 (cm) Tissue and other material debrided: Non-Viable, Eschar, Slough, Slough Level: Non-Viable Tissue Debridement Description: Selective/Open Wound Instrument: Curette Bleeding: Minimum Hemostasis Achieved: Pressure Procedural Pain: 0 Post Procedural Pain: 0 Response to Treatment: Procedure was tolerated well Level of Consciousness (Post- Awake and  Alert procedure): Post Debridement Measurements of Total Wound Length: (cm) 1.8 Width: (cm) 3.8 Depth: (cm) 0.1 Volume: (cm) 0.537 Character of Wound/Ulcer Post Debridement: Requires Further Debridement Severity of Tissue Post Debridement: Fat layer exposed Post Procedure Diagnosis Same as Pre-procedure Electronic Signature(s) Signed: 05/18/2022 1:46:15 PM By: Fredirick Maudlin MD FACS Signed: 05/18/2022 5:12:19 PM By: Blanche East RN Entered By: Blanche East on 05/18/2022 13:34:16 -------------------------------------------------------------------------------- HPI Details Patient Name: Date of Service: [name], [name] 05/18/2022 1:00 PM Medical Record Number: [id number] Patient Account Number: [id number] Date of Birth/Sex: Treating RN: 30-Sep-1935 (86 y.o. [name] Primary Care Provider: [name] Other Clinician: Referring Provider: Treating Provider/Extender: [name] in Treatment: 1 History of Present Illness HPI Description: [name] is an 86 year old male with a past medical history of CAD s/p DES to LCx 2006, carotid artery disease, hypertension, hyperlipidemia, paroxysmal atrial fibrillation on Eliquis and Cardizem that presents today for right lower extremity wound. He was seen by Dr. Larose Kells, his primary care provider on 4/29 for this issue and was referred to our clinic. Patient states that he had a small wound that spontaneously started in October 2021 and has not healed. He states this has progressively gotten larger. He has been using acne cream prescribed by the dermatologist for this issue. He reports drainage to the wound but no purulent drainage. He reports mild soreness to the wound. He has swelling in his right leg greater than left and reports this is a chronic issue for the past 1 to 2 years. He denies resting leg pain or pain with ambulation. Of note He was prescribed doxycycline at the end of last month and has finished this  course for possible skin infection to the wound site. 02/06/2021; I am seeing this patient who was admitted to the clinic last week by Dr. Heber Carrick. He has predominantly I think a venous insufficiency wound on the right medial lower leg he has been using Santyl Hydrofera Blue under 3 layer compression. Arterial studies  were ordered last week but do not seem to been put through. He is not a diabetic. He did have venous reflux studies done in August 2020. He did have abnormal reflux time was noted in the great saphenous vein in the distal thigh and the great saphenous vein at the knee. There was no evidence of DVT or SVT at the time he was not felt to have large enough saphenous veins on either side for intervention. Compression stockings at least knee-high were recommended. Follow-up was on a as needed basis with Dr. Donzetta Matters The patient does not describe claudication but his pulses in his feet are not vibrant this could be because of the swelling 5/26; patient presents for 1 week follow-up. He has been using Hydrofera Blue under 3 layer compression. He had arterial studies done. He has no issues or complaints today. He denies signs of infection. He has his juxta light compressions with him today. 6/3; patient presents for 1 week follow-up. He has been using Hydrofera Blue under 3 layer compression. He denies signs and symptoms of infection. He did have bright green drainage which he states he has not seen before when the dressing was taken off today. He is using his juxta light compression to the left leg. 6/10; patient presents for 1 week follow-up. He has been tolerating Hydrofera Blue with 3 layer compression. He again reports bright green drainage. However he denies any signs of infection. He has been using juxta light compression to the left leg. 6/24; patient presents for 2-week follow-up. He has been tolerating Hydrofera Blue with 3 layer compression. Today he is healed. He brought his juxta  lite compression for the right leg and continues to wear the juxta light compression on the left leg READMISSION 05/05/2022 The patient returns to clinic today with a new wound on his left posterior leg. He has been wearing his juxta lite stockings on a regular basis, but on exam he still has 3+ pitting edema. I suspect he has lymphedema in addition to his known venous reflux. The wounds are scattered geographic wounds with light slough accumulation. They have been applying mupirocin and CeraVe ointment. He does have a VP shunt placement scheduled for next week, but it sounds like his neurosurgeon is not aware of the fact that he has an open wound. 05/11/2022: His VP shunt placement has been postponed while we get his wound to heal. The wounds are all smaller today with just a little eschar and minimal slough. Edema control is good. No concern for infection. 05/18/2022: All of the open areas have contracted and there is good perimeter epithelialization. There is a little bit of eschar and slough on the open areas. Good edema control. No concern for infection. Electronic Signature(s) Signed: 05/18/2022 1:38:24 PM By: Fredirick Maudlin MD FACS Entered By: Fredirick Maudlin on 05/18/2022 13:38:24 -------------------------------------------------------------------------------- Physical Exam Details Patient Name: Date of Service: [name], [name] 05/18/2022 1:00 PM Medical Record Number: [id number] Patient Account Number: [id number] Date of Birth/Sex: Treating RN: Sep 19, 1936 (86 y.o. [name] Primary Care Provider: [name] Other Clinician: Referring Provider: Treating Provider/Extender: [name] in Treatment: 1 Constitutional Hypertensive, asymptomatic. . . . No acute distress.Marland Kitchen Respiratory Normal work of breathing on room air.. Notes 05/18/2022: All of the open areas have contracted and there is good perimeter epithelialization. There is a little bit of eschar  and slough on the open areas. Good edema control. No concern for infection. Electronic Signature(s) Signed: 05/18/2022 1:42:58 PM  By: Fredirick Maudlin MD FACS Entered By: Fredirick Maudlin on 05/18/2022 13:42:58 -------------------------------------------------------------------------------- Physician Orders Details Patient Name: Date of Service: [name], [name] 05/18/2022 1:00 PM Medical Record Number: [id number] Patient Account Number: [id number] Date of Birth/Sex: Treating RN: June 07, 1936 (86 y.o. [name] Primary Care Provider: [name] Other Clinician: Referring Provider: Treating Provider/Extender: [name] in Treatment: 1 Verbal / Phone Orders: No Diagnosis Coding ICD-10 Coding Code Description I87.2 Venous insufficiency (chronic) (peripheral) I48.91 Unspecified atrial fibrillation I10 Essential (primary) hypertension I25.10 Atherosclerotic heart disease of native coronary artery without angina pectoris L97.822 Non-pressure chronic ulcer of other part of left lower leg with fat layer exposed Follow-up Appointments ppointment in 1 week. - Dr. Celine Ahr RM 1 with [facility] Return A Tuesday 05/26/22 @ 12:30pm Anesthetic (In clinic) Topical Lidocaine 5% applied to wound bed Bathing/ Shower/ Hygiene May shower with protection but do not get wound dressing(s) wet. - can purchase cast protector at [facility] or [facility] Edema Control - Lymphedema / SCD / Other Elevate legs to the level of the heart or above for 30 minutes daily and/or when sitting, a frequency of: - throughout the day Avoid standing for long periods of time. Exercise regularly - including ankle circles while sitting Compression stocking or Garment 20-30 mm/Hg pressure to: - right leg daily Wound Treatment Wound #2 - Lower Leg Wound Laterality: Left, Posterior Peri-Wound Care: Sween Lotion (Moisturizing lotion) 1 x Per Week/30 Days Discharge Instructions: Apply moisturizing lotion as  directed Prim Dressing: Hydrofera Blue Ready Foam, 2.5 x2.5 in 1 x Per Week/30 Days ary Discharge Instructions: Apply to wound bed as instructed Secondary Dressing: Woven Gauze Sponge, Non-Sterile 4x4 in 1 x Per Week/30 Days Discharge Instructions: Apply over primary dressing as directed. Compression Wrap: ThreePress (3 layer compression wrap) 1 x Per Week/30 Days Discharge Instructions: Apply three layer compression as directed. Electronic Signature(s) Signed: 05/18/2022 1:46:15 PM By: Fredirick Maudlin MD FACS Entered By: Fredirick Maudlin on 05/18/2022 13:43:11 -------------------------------------------------------------------------------- Problem List Details Patient Name: Date of Service: [name], [name] 05/18/2022 1:00 PM Medical Record Number: [id number] Patient Account Number: [id number] Date of Birth/Sex: Treating RN: [birth year] (86 y.o. [name] Primary Care Provider: [name] Other Clinician: Referring Provider: Treating Provider/Extender: [name] in Treatment: 1 Active Problems ICD-10 Encounter Code Description Active Date MDM Diagnosis I87.2 Venous insufficiency (chronic) (peripheral) 05/05/2022 No Yes I48.91 Unspecified atrial fibrillation 05/05/2022 No Yes I10 Essential (primary) hypertension 05/05/2022 No Yes I25.10 Atherosclerotic heart disease of native coronary artery without angina pectoris 05/05/2022 No Yes L97.822 Non-pressure chronic ulcer of other part of left lower leg with fat layer exposed8/15/2023 No Yes Inactive Problems Resolved Problems Electronic Signature(s) Signed: 05/18/2022 1:37:36 PM By: Fredirick Maudlin MD FACS Entered By: Fredirick Maudlin on 05/18/2022 13:37:36 -------------------------------------------------------------------------------- Progress Note Details Patient Name: Date of Service: [name], [name] 05/18/2022 1:00 PM Medical Record Number: [id number] Patient Account Number: [id number] Date of  Birth/Sex: Treating RN: [birth year] (86 y.o. [name] Primary Care Provider: [name] Other Clinician: Referring Provider: Treating Provider/Extender: [name] in Treatment: 1 Subjective Chief Complaint Information obtained from Patient Left lower extremity wound History of Present Illness (HPI) [name] is an 86 year old male with a past medical history of CAD s/p DES to LCx 2006, carotid artery disease, hypertension, hyperlipidemia, paroxysmal atrial fibrillation on Eliquis and Cardizem that presents today for right lower extremity wound. He was seen by Dr. Larose Kells, his primary care provider  on 4/29 for this issue and was referred to our clinic. Patient states that he had a small wound that spontaneously started in October 2021 and has not healed. He states this has progressively gotten larger. He has been using acne cream prescribed by the dermatologist for this issue. He reports drainage to the wound but no purulent drainage. He reports mild soreness to the wound. He has swelling in his right leg greater than left and reports this is a chronic issue for the past 1 to 2 years. He denies resting leg pain or pain with ambulation. Of note He was prescribed doxycycline at the end of last month and has finished this course for possible skin infection to the wound site. 02/06/2021; I am seeing this patient who was admitted to the clinic last week by Dr. Heber [place]. He has predominantly I think a venous insufficiency wound on the right medial lower leg he has been using Santyl Hydrofera Blue under 3 layer compression. Arterial studies were ordered last week but do not seem to been put through. He is not a diabetic. He did have venous reflux studies done in August 2020. He did have abnormal reflux time was noted in the great saphenous vein in the distal thigh and the great saphenous vein at the knee. There was no evidence of DVT or SVT at the time he was not felt to have  large enough saphenous veins on either side for intervention. Compression stockings at least knee-high were recommended. Follow-up was on a as needed basis with Dr. Donzetta Matters The patient does not describe claudication but his pulses in his feet are not vibrant this could be because of the swelling 5/26; patient presents for 1 week follow-up. He has been using Hydrofera Blue under 3 layer compression. He had arterial studies done. He has no issues or complaints today. He denies signs of infection. He has his juxta light compressions with him today. 6/3; patient presents for 1 week follow-up. He has been using Hydrofera Blue under 3 layer compression. He denies signs and symptoms of infection. He did have bright green drainage which he states he has not seen before when the dressing was taken off today. He is using his juxta light compression to the left leg. 6/10; patient presents for 1 week follow-up. He has been tolerating Hydrofera Blue with 3 layer compression. He again reports bright green drainage. However he denies any signs of infection. He has been using juxta light compression to the left leg. 6/24; patient presents for 2-week follow-up. He has been tolerating Hydrofera Blue with 3 layer compression. Today he is healed. He brought his juxta lite compression for the right leg and continues to wear the juxta light compression on the left leg READMISSION 05/05/2022 The patient returns to clinic today with a new wound on his left posterior leg. He has been wearing his juxta lite stockings on a regular basis, but on exam he still has 3+ pitting edema. I suspect he has lymphedema in addition to his known venous reflux. The wounds are scattered geographic wounds with light slough accumulation. They have been applying mupirocin and CeraVe ointment. He does have a VP shunt placement scheduled for next week, but it sounds like his neurosurgeon is not aware of the fact that he has an open  wound. 05/11/2022: His VP shunt placement has been postponed while we get his wound to heal. The wounds are all smaller today with just a little eschar and minimal slough. Edema control is good.  No concern for infection. 05/18/2022: All of the open areas have contracted and there is good perimeter epithelialization. There is a little bit of eschar and slough on the open areas. Good edema control. No concern for infection. Patient History Information obtained from Patient. Family History Cancer - Father, Diabetes - Maternal Grandparents, Heart Disease - Maternal Grandparents, Hypertension - Maternal Grandparents, No family history of Hereditary Spherocytosis, Kidney Disease, Lung Disease, Seizures, Stroke, Thyroid Problems, Tuberculosis. Social History Former smoker - Quit over 30 years ago, Marital Status - Married, Alcohol Use - Rarely, Drug Use - No History, Caffeine Use - Daily. Medical History Eyes Patient has history of Glaucoma Cardiovascular Patient has history of Arrhythmia - afib, Coronary Artery Disease, Hypertension, Peripheral Venous Disease Endocrine Denies history of Type I Diabetes, Type II Diabetes Genitourinary Denies history of End Stage Renal Disease Integumentary (Skin) Denies history of History of Burn Musculoskeletal Patient has history of Osteoarthritis Oncologic Denies history of Received Chemotherapy, Received Radiation Psychiatric Denies history of Anorexia/bulimia, Confinement Anxiety Hospitalization/Surgery History - lumbar laminectomy. - total knee arthropathy right. - bil total hip arthroplasty. - coronary stents. - toe surgery. - tonsillectomy. Medical A Surgical History Notes nd Gastrointestinal colon polyps Endocrine Hypothyroidism Genitourinary BPH Musculoskeletal Bilat Hip Replacements, Right Knee Replacement, lumbar stenosis Neurologic hydrocephalus Oncologic Skin Cancer multiple sites Objective Constitutional Hypertensive, asymptomatic.  No acute distress.. Vitals Time Taken: 1:07 PM, Height: 65 in, Weight: 195 lbs, BMI: 32.4, Temperature: 97.7 F, Pulse: 71 bpm, Respiratory Rate: 18 breaths/min, Blood Pressure: 160/77 mmHg. Respiratory Normal work of breathing on room air.. General Notes: 05/18/2022: All of the open areas have contracted and there is good perimeter epithelialization. There is a little bit of eschar and slough on the open areas. Good edema control. No concern for infection. Integumentary (Hair, Skin) Wound #2 status is Open. Original cause of wound was Gradually Appeared. The date acquired was: 03/30/2022. The wound has been in treatment 1 weeks. The wound is located on the Left,Posterior Lower Leg. The wound measures 1.8cm length x 3.8cm width x 0.1cm depth; 5.372cm^2 area and 0.537cm^3 volume. There is Fat Layer (Subcutaneous Tissue) exposed. There is no tunneling or undermining noted. There is a medium amount of serous drainage noted. The wound margin is indistinct and nonvisible. There is medium (34-66%) red, pink granulation within the wound bed. There is a medium (34-66%) amount of necrotic tissue within the wound bed including Eschar and Adherent Slough. Assessment Active Problems ICD-10 Venous insufficiency (chronic) (peripheral) Unspecified atrial fibrillation Essential (primary) hypertension Atherosclerotic heart disease of native coronary artery without angina pectoris Non-pressure chronic ulcer of other part of left lower leg with fat layer exposed Procedures Wound #2 Pre-procedure diagnosis of Wound #2 is a Venous Leg Ulcer located on the Left,Posterior Lower Leg .Severity of Tissue Pre Debridement is: Fat layer exposed. There was a Selective/Open Wound Non-Viable Tissue Debridement with a total area of 6.84 sq cm performed by Fredirick Maudlin, MD. With the following instrument(s): Curette to remove Non-Viable tissue/material. Material removed includes Eschar and Slough and after achieving pain  control using Lidocaine 4% T opical Solution. No specimens were taken. A time out was conducted at 13:31, prior to the start of the procedure. A Minimum amount of bleeding was controlled with Pressure. The procedure was tolerated well with a pain level of 0 throughout and a pain level of 0 following the procedure. Post Debridement Measurements: 1.8cm length x 3.8cm width x 0.1cm depth; 0.537cm^3 volume. Character of Wound/Ulcer Post Debridement requires further debridement.  Severity of Tissue Post Debridement is: Fat layer exposed. Post procedure Diagnosis Wound #2: Same as Pre-Procedure Pre-procedure diagnosis of Wound #2 is a Venous Leg Ulcer located on the Left,Posterior Lower Leg . There was a Three Layer Compression Therapy Procedure by Blanche East, RN. Post procedure Diagnosis Wound #2: Same as Pre-Procedure Plan Follow-up Appointments: Return Appointment in 1 week. - Dr. Celine Ahr RM 1 with [facility] Tuesday 05/26/22 @ 12:30pm Anesthetic: (In clinic) Topical Lidocaine 5% applied to wound bed Bathing/ Shower/ Hygiene: May shower with protection but do not get wound dressing(s) wet. - can purchase cast protector at [facility] or [facility] Edema Control - Lymphedema / SCD / Other: Elevate legs to the level of the heart or above for 30 minutes daily and/or when sitting, a frequency of: - throughout the day Avoid standing for long periods of time. Exercise regularly - including ankle circles while sitting Compression stocking or Garment 20-30 mm/Hg pressure to: - right leg daily WOUND #2: - Lower Leg Wound Laterality: Left, Posterior Peri-Wound Care: Sween Lotion (Moisturizing lotion) 1 x Per Week/30 Days Discharge Instructions: Apply moisturizing lotion as directed Prim Dressing: Hydrofera Blue Ready Foam, 2.5 x2.5 in 1 x Per Week/30 Days ary Discharge Instructions: Apply to wound bed as instructed Secondary Dressing: Woven Gauze Sponge, Non-Sterile 4x4 in 1 x Per Week/30 Days Discharge  Instructions: Apply over primary dressing as directed. Com pression Wrap: ThreePress (3 layer compression wrap) 1 x Per Week/30 Days Discharge Instructions: Apply three layer compression as directed. 05/18/2022: All of the open areas have contracted and there is good perimeter epithelialization. There is a little bit of eschar and slough on the open areas. Good edema control. No concern for infection. I used a curette to debride slough and eschar from the wounds. We will continue Hydrofera Blue and 3 layer compression. Follow-up in 1 week. Electronic Signature(s) Signed: 05/18/2022 1:43:32 PM By: Fredirick Maudlin MD FACS Entered By: Fredirick Maudlin on 05/18/2022 13:43:31 -------------------------------------------------------------------------------- HxROS Details Patient Name: Date of Service: [name], [name] 05/18/2022 1:00 PM Medical Record Number: [id number] Patient Account Number: [id number] Date of Birth/Sex: Treating RN: 03-Jul-1936 (86 y.o. [name] Primary Care Provider: [name] Other Clinician: Referring Provider: Treating Provider/Extender: [name] in Treatment: 1 Information Obtained From Patient Eyes Medical History: Positive for: Glaucoma Cardiovascular Medical History: Positive for: Arrhythmia - afib; Coronary Artery Disease; Hypertension; Peripheral Venous Disease Gastrointestinal Medical History: Past Medical History Notes: colon polyps Endocrine Medical History: Negative for: Type I Diabetes; Type II Diabetes Past Medical History Notes: Hypothyroidism Genitourinary Medical History: Negative for: End Stage Renal Disease Past Medical History Notes: BPH Integumentary (Skin) Medical History: Negative for: History of Burn Musculoskeletal Medical History: Positive for: Osteoarthritis Past Medical History Notes: Bilat Hip Replacements, Right Knee Replacement, lumbar stenosis Neurologic Medical History: Past Medical  History Notes: hydrocephalus Oncologic Medical History: Negative for: Received Chemotherapy; Received Radiation Past Medical History Notes: Skin Cancer multiple sites Psychiatric Medical History: Negative for: Anorexia/bulimia; Confinement Anxiety HBO Extended History Items Eyes: Glaucoma Immunizations Pneumococcal Vaccine: Received Pneumococcal Vaccination: Yes Received Pneumococcal Vaccination On or After 60th Birthday: Yes Implantable Devices None Hospitalization / Surgery History Type of Hospitalization/Surgery lumbar laminectomy total knee arthropathy right bil total hip arthroplasty coronary stents toe surgery tonsillectomy Family and Social History Cancer: Yes - Father; Diabetes: Yes - Maternal Grandparents; Heart Disease: Yes - Maternal Grandparents; Hereditary Spherocytosis: No; Hypertension: Yes - Maternal Grandparents; Kidney Disease: No; Lung Disease: No; Seizures: No; Stroke: No; Thyroid Problems:  No; Tuberculosis: No; Former smoker - Quit over 30 years ago; Marital Status - Married; Alcohol Use: Rarely; Drug Use: No History; Caffeine Use: Daily; Financial Concerns: No; Food, Clothing or Shelter Needs: No; Support System Lacking: No; Transportation Concerns: No Engineer, maintenance) Signed: 05/18/2022 1:46:15 PM By: Fredirick Maudlin MD FACS Signed: 05/19/2022 6:08:33 PM By: Baruch Gouty RN, BSN Entered By: Fredirick Maudlin on 05/18/2022 13:40:18 -------------------------------------------------------------------------------- [place] Details Patient Name: Date of Service: [name], [name] 05/18/2022 Medical Record Number: [id number] Patient Account Number: [id number] Date of Birth/Sex: Treating RN: Feb 11, 1936 (86 y.o. 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 Facility Procedures CPT4 Code: [id number] Description: [phone] - DEBRIDE WOUND 1ST 20 SQ CM OR < ICD-10 Diagnosis Description L97.822 Non-pressure chronic ulcer of other part of left lower leg with fat layer expose Modifier: d Quantity: 1 Physician Procedures : CPT4 Code Description Modifier [id number] - WC PHYS LEVEL 3 - EST PT 25 ICD-10 Diagnosis Description L97.822 Non-pressure chronic ulcer of other part of left lower leg with fat layer exposed I87.2 Venous insufficiency (chronic) (peripheral) I10  Essential (primary) hypertension Quantity: 1 : [id number] - WC PHYS DEBR WO ANESTH 20 SQ CM ICD-10 Diagnosis Description L97.822 Non-pressure chronic ulcer of other part of left lower leg with fat layer exposed Quantity: 1 Electronic Signature(s) Signed: 05/18/2022 1:43:53 PM By: Fredirick Maudlin MD FACS Entered By: Fredirick Maudlin on 05/18/2022 13:43:53

## 2022-05-19 NOTE — Progress Notes (Signed)
[name], [name] ([id number]) Visit Report for 05/18/2022 Arrival Information Details Patient Name: Date of Service: [name], [place] RD E. 05/18/2022 1:00 PM Medical Record Number: [id number] Patient Account Number: [id number] Date of Birth/Sex: Treating RN: [birth year] (86 y.o. [name] Primary Care Burdette Forehand: [name] Other Clinician: Referring Michiah Mudry: Treating Taylon Louison/Extender: [name] in Treatment: 1 Visit Information History Since Last Visit All ordered tests and consults were completed: Yes Patient Arrived: [name] Added or deleted any medications: No Arrival Time: 13:05 Any new allergies or adverse reactions: No Accompanied By: spouse Had a fall or experienced change in No Transfer Assistance: None activities of daily living that may affect Patient Requires Transmission-Based Precautions: No risk of falls: Patient Has Alerts: Yes Signs or symptoms of abuse/neglect since last visito No Patient Alerts: R ABI = 1.09, TBI= .77 Hospitalized since last visit: No L ABI = 1.02, TBI = .83 Implantable device outside of the clinic excluding No cellular tissue based products placed in the center since last visit: Has Dressing in Place as Prescribed: Yes Has Compression in Place as Prescribed: Yes Pain Present Now: Yes Electronic Signature(s) Signed: 05/18/2022 5:12:19 PM By: Blanche East RN Entered By: Blanche East on 05/18/2022 13:07:12 -------------------------------------------------------------------------------- Compression Therapy Details Patient Name: Date of Service: [name], [name] 05/18/2022 1:00 PM Medical Record Number: [id number] Patient Account Number: [id number] Date of Birth/Sex: Treating RN: 12-13-1935 (86 y.o. [name] Primary Care Fleet Higham: [name] Other Clinician: Referring Krishiv Sandler: Treating Aralyn Nowak/Extender: [name] in Treatment: 1 Compression Therapy Performed for Wound Assessment: Wound  #2 [place] Lower Leg Performed By: Clinician Blanche East, RN Compression Type: Three Layer Post Procedure Diagnosis Same as Pre-procedure Electronic Signature(s) Signed: 05/18/2022 5:12:19 PM By: Blanche East RN Entered By: Blanche East on 05/18/2022 13:35:56 -------------------------------------------------------------------------------- Encounter Discharge Information Details Patient Name: Date of Service: [name], [name] 05/18/2022 1:00 PM Medical Record Number: [id number] Patient Account Number: [id number] Date of Birth/Sex: Treating RN: [birth year] (86 y.o. [name] Primary Care Rayhan Groleau: [name] Other Clinician: Referring Victorious Kundinger: Treating Lemon Whitacre/Extender: [name] in Treatment: 1 Encounter Discharge Information Items Post Procedure Vitals Discharge Condition: Stable Temperature (F): 97.7 Ambulatory Status: Cane Pulse (bpm): 71 Discharge Destination: Home Respiratory Rate (breaths/min): 18 Transportation: Private Auto Blood Pressure (mmHg): 160/77 Accompanied By: spouse Schedule Follow-up Appointment: Yes Clinical Summary of Care: Electronic Signature(s) Signed: 05/18/2022 5:12:19 PM By: Blanche East RN Entered By: Blanche East on 05/18/2022 13:35:34 -------------------------------------------------------------------------------- Lower Extremity Assessment Details Patient Name: Date of Service: [name], [name] 05/18/2022 1:00 PM Medical Record Number: [id number] Patient Account Number: [id number] Date of Birth/Sex: Treating RN: [birth year] (86 y.o. [name] Primary Care Raiza Kiesel: [name] Other Clinician: Referring Deivi Huckins: Treating Deshonna Trnka/Extender: [name] in Treatment: 1 Edema Assessment Assessed: [Left: No] [Right: No] E[Left: dema] [Right: :] Calf Left: Right: Point of Measurement: 30 cm From Medial Instep 37.5 cm Ankle Left: Right: Point of Measurement: 13 cm From  Medial Instep 23 cm Vascular Assessment Pulses: Dorsalis Pedis Palpable: [Left:Yes] Electronic Signature(s) Signed: 05/18/2022 5:12:19 PM By: Blanche East RN Entered By: Blanche East on 05/18/2022 13:19:40 -------------------------------------------------------------------------------- Multi Wound Chart Details Patient Name: Date of Service: [name], [name] 05/18/2022 1:00 PM Medical Record Number: [id number] Patient Account Number: [id number] Date of Birth/Sex: Treating RN: 1936-06-11 (86 y.o. [name] Primary Care Kyrra Prada: [name] Other Clinician: Referring Iniya Matzek: Treating Casmir Auguste/Extender: Tonette Lederer, Ashley Murrain in  Treatment: 1 Vital Signs Height(in): 65 Pulse(bpm): 51 Weight(lbs): 195 Blood Pressure(mmHg): 160/77 Body Mass Index(BMI): 32.4 Temperature(F): 97.7 Respiratory Rate(breaths/min): 18 Photos: [N/A:N/A] Left, Posterior Lower Leg N/A N/A Wound Location: Gradually Appeared N/A N/A Wounding Event: Venous Leg Ulcer N/A N/A Primary Etiology: Lymphedema N/A N/A Secondary Etiology: Glaucoma, Arrhythmia, Coronary N/A N/A Comorbid History: Artery Disease, Hypertension, Peripheral Venous Disease, Osteoarthritis 03/30/2022 N/A N/A Date Acquired: 1 N/A N/A Weeks of Treatment: Open N/A N/A Wound Status: No N/A N/A Wound Recurrence: 1.8x3.8x0.1 N/A N/A Measurements L x W x D (cm) 5.372 N/A N/A A (cm) : rea 0.537 N/A N/A Volume (cm) : 46.40% N/A N/A % Reduction in A rea: 46.40% N/A N/A % Reduction in Volume: Full Thickness Without Exposed N/A N/A Classification: Support Structures Medium N/A N/A Exudate A mount: Serous N/A N/A Exudate Type: amber N/A N/A Exudate Color: Indistinct, nonvisible N/A N/A Wound Margin: Medium (34-66%) N/A N/A Granulation A mount: Red, Pink N/A N/A Granulation Quality: Medium (34-66%) N/A N/A Necrotic A mount: Eschar, Adherent Slough N/A N/A Necrotic Tissue: Fat Layer  (Subcutaneous Tissue): Yes N/A N/A Exposed Structures: Fascia: No Tendon: No Muscle: No Joint: No Bone: No Medium (34-66%) N/A N/A Epithelialization: Debridement - Selective/Open Wound N/A N/A Debridement: Pre-procedure Verification/Time Out 13:31 N/A N/A Taken: Lidocaine 4% Topical Solution N/A N/A Pain Control: Necrotic/Eschar, Slough N/A N/A Tissue Debrided: Non-Viable Tissue N/A N/A Level: 6.84 N/A N/A Debridement A (sq cm): rea Curette N/A N/A Instrument: Minimum N/A N/A Bleeding: Pressure N/A N/A Hemostasis A chieved: 0 N/A N/A Procedural Pain: 0 N/A N/A Post Procedural Pain: Procedure was tolerated well N/A N/A Debridement Treatment Response: Post Debridement Measurements L x 1.8x3.8x0.1 N/A N/A W x D (cm) 0.537 N/A N/A Post Debridement Volume: (cm) Compression Therapy N/A N/A Procedures Performed: Debridement Treatment Notes Wound #2 (Lower Leg) Wound Laterality: Left, Posterior Cleanser Peri-Wound Care Sween Lotion (Moisturizing lotion) Discharge Instruction: Apply moisturizing lotion as directed Topical Primary Dressing Hydrofera Blue Ready Foam, 2.5 x2.5 in Discharge Instruction: Apply to wound bed as instructed Secondary Dressing Woven Gauze Sponge, Non-Sterile 4x4 in Discharge Instruction: Apply over primary dressing as directed. Secured With Compression Wrap ThreePress (3 layer compression wrap) Discharge Instruction: Apply three layer compression as directed. Compression Stockings Add-Ons Electronic Signature(s) Signed: 05/18/2022 1:37:41 PM By: Fredirick Maudlin MD FACS Signed: 05/19/2022 6:08:33 PM By: Baruch Gouty RN, BSN Entered By: Fredirick Maudlin on 05/18/2022 13:37:41 -------------------------------------------------------------------------------- Multi-Disciplinary Care Plan Details Patient Name: Date of Service: [name], [name] 05/18/2022 1:00 PM Medical Record Number: [id number] Patient Account Number: [id number] Date  of Birth/Sex: Treating RN: Apr 19, 1936 (86 y.o. [name] Primary Care Charlsie Fleeger: [name] Other Clinician: Referring Shabree Tebbetts: Treating Fitzhugh Vizcarrondo/Extender: [name] in Treatment: 1 Active Inactive Wound/Skin Impairment Nursing Diagnoses: Impaired tissue integrity Goals: Ulcer/skin breakdown will have a volume reduction of 30% by week 4 Date Initiated: 05/18/2022 Target Resolution Date: 06/15/2022 Goal Status: Active Interventions: Assess patient/caregiver ability to obtain necessary supplies Assess patient/caregiver ability to perform ulcer/skin care regimen upon admission and as needed Treatment Activities: Skin care regimen initiated : 05/18/2022 Notes: Electronic Signature(s) Signed: 05/18/2022 5:12:19 PM By: Blanche East RN Entered By: Blanche East on 05/18/2022 13:25:47 -------------------------------------------------------------------------------- Pain Assessment Details Patient Name: Date of Service: [name], [name] 05/18/2022 1:00 PM Medical Record Number: [id number] Patient Account Number: [id number] Date of Birth/Sex: Treating RN: Sep 07, 1936 (86 y.o. [name] Primary Care Kaysen Deal: [name] Other Clinician: Referring Karelyn Brisby: Treating Cantrell Martus/Extender: Tonette Lederer, Ashley Murrain  in Treatment: 1 Active Problems Location of Pain Severity and Description of Pain Patient Has Paino Yes Site Locations With Dressing Change: No Rate the pain. Current Pain Level: 3 Character of Pain Describe the Pain: Aching Pain Management and Medication Current Pain Management: Electronic Signature(s) Signed: 05/18/2022 5:12:19 PM By: Blanche East RN Entered By: Blanche East on 05/18/2022 13:11:20 -------------------------------------------------------------------------------- Patient/Caregiver Education Details Patient Name: Date of Service: [name], [name] 8/28/2023andnbsp1:00 PM Medical Record Number: [id number] Patient  Account Number: [id number] Date of Birth/Gender: Treating RN: 1936/03/13 (86 y.o. [name] Primary Care Physician: [name] Other Clinician: Referring Physician: Treating Physician/Extender: [name] in Treatment: 1 Education Assessment Education Provided To: Patient Education Topics Provided Wound/Skin Impairment: Methods: Explain/Verbal Responses: Reinforcements needed, State content correctly Electronic Signature(s) Signed: 05/18/2022 5:12:19 PM By: Blanche East RN Entered By: Blanche East on 05/18/2022 13:26:02 -------------------------------------------------------------------------------- Wound Assessment Details Patient Name: Date of Service: [name], [name] 05/18/2022 1:00 PM Medical Record Number: [id number] Patient Account Number: [id number] Date of Birth/Sex: Treating RN: Mar 01, 1936 (86 y.o. [name] Primary Care Leory Allinson: [name] Other Clinician: Referring Traeson Dusza: Treating Tanzie Rothschild/Extender: [name] in Treatment: 1 Wound Status Wound Number: 2 Primary Venous Leg Ulcer Etiology: Wound Location: Left, Posterior Lower Leg Secondary Lymphedema Wounding Event: Gradually Appeared Etiology: Date Acquired: 03/30/2022 Wound Open Weeks Of Treatment: 1 Status: Clustered Wound: No Comorbid Glaucoma, Arrhythmia, Coronary Artery Disease, Hypertension, History: Peripheral Venous Disease, Osteoarthritis Photos Wound Measurements Length: (cm) 1.8 Width: (cm) 3.8 Depth: (cm) 0.1 Area: (cm) 5.372 Volume: (cm) 0.537 % Reduction in Area: 46.4% % Reduction in Volume: 46.4% Epithelialization: Medium (34-66%) Tunneling: No Undermining: No Wound Description Classification: Full Thickness Without Exposed Support Structures Wound Margin: Indistinct, nonvisible Exudate Amount: Medium Exudate Type: Serous Exudate Color: amber Wound Bed Granulation Amount: Medium (34-66%) Granulation Quality: Red,  Pink Necrotic Amount: Medium (34-66%) Necrotic Quality: Eschar, Adherent Slough Foul Odor After Cleansing: No Slough/Fibrino Yes Exposed Structure Fascia Exposed: No Fat Layer (Subcutaneous Tissue) Exposed: Yes Tendon Exposed: No Muscle Exposed: No Joint Exposed: No Bone Exposed: No Treatment Notes Wound #2 (Lower Leg) Wound Laterality: Left, Posterior Cleanser Peri-Wound Care Sween Lotion (Moisturizing lotion) Discharge Instruction: Apply moisturizing lotion as directed Topical Primary Dressing Hydrofera Blue Ready Foam, 2.5 x2.5 in Discharge Instruction: Apply to wound bed as instructed Secondary Dressing Woven Gauze Sponge, Non-Sterile 4x4 in Discharge Instruction: Apply over primary dressing as directed. Secured With Compression Wrap ThreePress (3 layer compression wrap) Discharge Instruction: Apply three layer compression as directed. Compression Stockings Add-Ons Electronic Signature(s) Signed: 05/18/2022 5:12:19 PM By: Blanche East RN Entered By: Blanche East on 05/18/2022 13:24:09 -------------------------------------------------------------------------------- Vitals Details Patient Name: Date of Service: [name], [name] 05/18/2022 1:00 PM Medical Record Number: [id number] Patient Account Number: [id number] Date of Birth/Sex: Treating RN: 06-Nov-1935 (86 y.o. [name] Primary Care Lukas Pelcher: [name] Other Clinician: Referring Makye Radle: Treating Maryjayne Kleven/Extender: [name] in Treatment: 1 Vital Signs Time Taken: 13:07 Temperature (F): 97.7 Height (in): 65 Pulse (bpm): 71 Weight (lbs): 195 Respiratory Rate (breaths/min): 18 Body Mass Index (BMI): 32.4 Blood Pressure (mmHg): 160/77 Reference Range: 80 - 120 mg / dl Electronic Signature(s) Signed: 05/18/2022 5:12:19 PM By: Blanche East RN Entered By: Blanche East on 05/18/2022 13:10:42

## 2022-05-21 DIAGNOSIS — I2510 Atherosclerotic heart disease of native coronary artery without angina pectoris: Secondary | ICD-10-CM

## 2022-05-21 DIAGNOSIS — I10 Essential (primary) hypertension: Secondary | ICD-10-CM | POA: Diagnosis not present

## 2022-05-21 DIAGNOSIS — E039 Hypothyroidism, unspecified: Secondary | ICD-10-CM

## 2022-05-21 DIAGNOSIS — I4891 Unspecified atrial fibrillation: Secondary | ICD-10-CM | POA: Diagnosis not present

## 2022-05-21 DIAGNOSIS — E785 Hyperlipidemia, unspecified: Secondary | ICD-10-CM | POA: Diagnosis not present

## 2022-05-26 ENCOUNTER — Encounter (HOSPITAL_BASED_OUTPATIENT_CLINIC_OR_DEPARTMENT_OTHER): Payer: Medicare Other | Attending: General Surgery | Admitting: General Surgery

## 2022-05-26 DIAGNOSIS — I890 Lymphedema, not elsewhere classified: Secondary | ICD-10-CM | POA: Diagnosis not present

## 2022-05-26 DIAGNOSIS — I872 Venous insufficiency (chronic) (peripheral): Secondary | ICD-10-CM | POA: Insufficient documentation

## 2022-05-26 DIAGNOSIS — I2510 Atherosclerotic heart disease of native coronary artery without angina pectoris: Secondary | ICD-10-CM | POA: Insufficient documentation

## 2022-05-26 DIAGNOSIS — L97822 Non-pressure chronic ulcer of other part of left lower leg with fat layer exposed: Secondary | ICD-10-CM | POA: Insufficient documentation

## 2022-05-26 DIAGNOSIS — L97222 Non-pressure chronic ulcer of left calf with fat layer exposed: Secondary | ICD-10-CM | POA: Diagnosis not present

## 2022-05-26 DIAGNOSIS — S81801A Unspecified open wound, right lower leg, initial encounter: Secondary | ICD-10-CM | POA: Insufficient documentation

## 2022-05-26 DIAGNOSIS — Z7901 Long term (current) use of anticoagulants: Secondary | ICD-10-CM | POA: Diagnosis not present

## 2022-05-26 DIAGNOSIS — I10 Essential (primary) hypertension: Secondary | ICD-10-CM | POA: Insufficient documentation

## 2022-05-26 DIAGNOSIS — I480 Paroxysmal atrial fibrillation: Secondary | ICD-10-CM | POA: Diagnosis not present

## 2022-05-27 NOTE — Progress Notes (Signed)
[name], [name] ([id number]) Visit Report for 05/26/2022 Arrival Information Details Patient Name: Date of Service: [name], [place] RD E. 05/26/2022 12:30 PM Medical Record Number: [id number] Patient Account Number: [id number] Date of Birth/Sex: Treating RN: April 09, 1936 (86 y.o. Mare Ferrari Primary Care Raiden Haydu: [name] Other Clinician: Referring Pavel Gadd: Treating Shatina Streets/Extender: [name] in Treatment: 3 Visit Information History Since Last Visit Added or deleted any medications: No Patient Arrived: [name] Any new allergies or adverse reactions: No Arrival Time: 12:40 Had a fall or experienced change in No Accompanied By: wife activities of daily living that may affect Transfer Assistance: None risk of falls: Patient Requires Transmission-Based Precautions: No Signs or symptoms of abuse/neglect since last visito No Patient Has Alerts: Yes Hospitalized since last visit: No Patient Alerts: R ABI = 1.09, TBI= .77 Implantable device outside of the clinic excluding No L ABI = 1.02, TBI = .83 cellular tissue based products placed in the center since last visit: Has Dressing in Place as Prescribed: Yes Has Compression in Place as Prescribed: Yes Pain Present Now: No Electronic Signature(s) Signed: 05/26/2022 4:56:45 PM By: Sharyn Creamer RN, BSN Entered By: Sharyn Creamer on 05/26/2022 12:45:06 -------------------------------------------------------------------------------- Compression Therapy Details Patient Name: Date of Service: [name], [name] 05/26/2022 12:30 PM Medical Record Number: [id number] Patient Account Number: [id number] Date of Birth/Sex: Treating RN: [birth year] (86 y.o. Mare Ferrari Primary Care Darah Simkin: [name] Other Clinician: Referring Jahvier Aldea: Treating Shakeda Pearse/Extender: [name] in Treatment: 3 Compression Therapy Performed for Wound Assessment: Wound #2 Left,Posterior Lower Leg Performed By:  Clinician Sharyn Creamer, RN Compression Type: Three Layer Post Procedure Diagnosis Same as Pre-procedure Electronic Signature(s) Signed: 05/26/2022 4:56:45 PM By: Sharyn Creamer RN, BSN Entered By: Sharyn Creamer on 05/26/2022 16:01:54 -------------------------------------------------------------------------------- Encounter Discharge Information Details Patient Name: Date of Service: [name], [name] 05/26/2022 12:30 PM Medical Record Number: [id number] Patient Account Number: [id number] Date of Birth/Sex: Treating RN: October 06, 1935 (86 y.o. Mare Ferrari Primary Care Lakyn Alsteen: [name] Other Clinician: Referring Brigit Doke: Treating Schylar Wuebker/Extender: [name] in Treatment: 3 Encounter Discharge Information Items Post Procedure Vitals Discharge Condition: Stable Temperature (F): 97.8 Ambulatory Status: Cane Pulse (bpm): 64 Discharge Destination: Home Respiratory Rate (breaths/min): 18 Transportation: Private Auto Blood Pressure (mmHg): 136/79 Accompanied By: wife Schedule Follow-up Appointment: Yes Clinical Summary of Care: Patient Declined Electronic Signature(s) Signed: 05/26/2022 4:56:45 PM By: Sharyn Creamer RN, BSN Entered By: Sharyn Creamer on 05/26/2022 13:23:27 -------------------------------------------------------------------------------- Lower Extremity Assessment Details Patient Name: Date of Service: [name], [name] 05/26/2022 12:30 PM Medical Record Number: [id number] Patient Account Number: [id number] Date of Birth/Sex: Treating RN: 06-05-1936 (86 y.o. Mare Ferrari Primary Care Apryle Stowell: [name] Other Clinician: Referring Johnwesley Lederman: Treating Daryl Quiros/Extender: [name] in Treatment: 3 Edema Assessment Assessed: [Left: No] [Right: No] E[Left: dema] [Right: :] Calf Left: Right: Point of Measurement: 30 cm From Medial Instep 34.2 cm Ankle Left: Right: Point of Measurement: 13 cm From Medial  Instep 23.3 cm Vascular Assessment Pulses: Dorsalis Pedis Palpable: [Left:Yes] Electronic Signature(s) Signed: 05/26/2022 4:56:45 PM By: Sharyn Creamer RN, BSN Entered By: Sharyn Creamer on 05/26/2022 12:51:59 -------------------------------------------------------------------------------- Multi Wound Chart Details Patient Name: Date of Service: [name], [name] 05/26/2022 12:30 PM Medical Record Number: [id number] Patient Account Number: [id number] Date of Birth/Sex: Treating RN: 01/30/1936 (86 y.o. [name] Primary Care Cade Dashner: [name] Other Clinician: Referring Olivea Sonnen: Treating Kimyatta Lecy/Extender: [name] in Treatment: 3  Vital Signs Height(in): 65 Pulse(bpm): 86 Weight(lbs): 195 Blood Pressure(mmHg): 136/79 Body Mass Index(BMI): 32.4 Temperature(F): 97.8 Respiratory Rate(breaths/min): 18 Photos: [N/A:N/A] Left, Posterior Lower Leg N/A N/A Wound Location: Gradually Appeared N/A N/A Wounding Event: Venous Leg Ulcer N/A N/A Primary Etiology: Lymphedema N/A N/A Secondary Etiology: Glaucoma, Arrhythmia, Coronary N/A N/A Comorbid History: Artery Disease, Hypertension, Peripheral Venous Disease, Osteoarthritis 03/30/2022 N/A N/A Date Acquired: 3 N/A N/A Weeks of Treatment: Open N/A N/A Wound Status: No N/A N/A Wound Recurrence: 1.3x3.2x0.1 N/A N/A Measurements L x W x D (cm) 3.267 N/A N/A A (cm) : rea 0.327 N/A N/A Volume (cm) : 67.40% N/A N/A % Reduction in A rea: 67.30% N/A N/A % Reduction in Volume: Full Thickness Without Exposed N/A N/A Classification: Support Structures Medium N/A N/A Exudate A mount: Serous N/A N/A Exudate Type: amber N/A N/A Exudate Color: Indistinct, nonvisible N/A N/A Wound Margin: None Present (0%) N/A N/A Granulation A mount: Large (67-100%) N/A N/A Necrotic A mount: Eschar, Adherent Slough N/A N/A Necrotic Tissue: Fat Layer (Subcutaneous Tissue): Yes N/A N/A Exposed  Structures: Fascia: No Tendon: No Muscle: No Joint: No Bone: No Medium (34-66%) N/A N/A Epithelialization: Debridement - Selective/Open Wound N/A N/A Debridement: Pre-procedure Verification/Time Out 13:02 N/A N/A Taken: Lidocaine 5% topical ointment N/A N/A Pain Control: Necrotic/Eschar, Slough N/A N/A Tissue Debrided: Non-Viable Tissue N/A N/A Level: 4.16 N/A N/A Debridement A (sq cm): rea Curette N/A N/A Instrument: Minimum N/A N/A Bleeding: Pressure N/A N/A Hemostasis A chieved: 0 N/A N/A Procedural Pain: 0 N/A N/A Post Procedural Pain: Procedure was tolerated well N/A N/A Debridement Treatment Response: 1.3x3.2x0.1 N/A N/A Post Debridement Measurements L x W x D (cm) 0.327 N/A N/A Post Debridement Volume: (cm) Debridement N/A N/A Procedures Performed: Treatment Notes Wound #2 (Lower Leg) Wound Laterality: Left, Posterior Cleanser Peri-Wound Care Sween Lotion (Moisturizing lotion) Discharge Instruction: Apply moisturizing lotion as directed Topical Primary Dressing KerraCel Ag Gelling Fiber Dressing, 2x2 in (silver alginate) Discharge Instruction: Apply silver alginate to wound bed as instructed Secondary Dressing Woven Gauze Sponge, Non-Sterile 4x4 in Discharge Instruction: Apply over primary dressing as directed. Secured With Compression Wrap ThreePress (3 layer compression wrap) Discharge Instruction: Apply three layer compression as directed. Compression Stockings Add-Ons Electronic Signature(s) Signed: 05/26/2022 1:22:40 PM By: Fredirick Maudlin MD FACS Signed: 05/27/2022 5:54:13 PM By: Baruch Gouty RN, BSN Entered By: Fredirick Maudlin on 05/26/2022 13:22:40 -------------------------------------------------------------------------------- Multi-Disciplinary Care Plan Details Patient Name: Date of Service: [name], [name] 05/26/2022 12:30 PM Medical Record Number: [id number] Patient Account Number: [id number] Date of Birth/Sex: Treating  RN: 11/23/1935 (86 y.o. Mare Ferrari Primary Care Tyger Oka: [name] Other Clinician: Referring Sorren Vallier: Treating Rosanne Wohlfarth/Extender: [name] in Treatment: 3 Active Inactive Wound/Skin Impairment Nursing Diagnoses: Impaired tissue integrity Goals: Ulcer/skin breakdown will have a volume reduction of 30% by week 4 Date Initiated: 05/18/2022 Target Resolution Date: 06/15/2022 Goal Status: Active Interventions: Assess patient/caregiver ability to obtain necessary supplies Assess patient/caregiver ability to perform ulcer/skin care regimen upon admission and as needed Treatment Activities: Skin care regimen initiated : 05/18/2022 Notes: Electronic Signature(s) Signed: 05/26/2022 4:56:45 PM By: Sharyn Creamer RN, BSN Entered By: Sharyn Creamer on 05/26/2022 12:58:08 -------------------------------------------------------------------------------- Pain Assessment Details Patient Name: Date of Service: [name], [name] 05/26/2022 12:30 PM Medical Record Number: [id number] Patient Account Number: [id number] Date of Birth/Sex: Treating RN: 02/13/1936 (86 y.o. Mare Ferrari Primary Care Geovanie Winnett: [name] Other Clinician: Referring Emanuelle Hammerstrom: Treating Arine Foley/Extender: [name] in Treatment: 3 Active  Problems Location of Pain Severity and Description of Pain Patient Has Paino No Site Locations Pain Management and Medication Current Pain Management: Electronic Signature(s) Signed: 05/26/2022 4:56:45 PM By: Sharyn Creamer RN, BSN Entered By: Sharyn Creamer on 05/26/2022 12:45:15 -------------------------------------------------------------------------------- Patient/Caregiver Education Details Patient Name: Date of Service: [name], [name] 9/5/2023andnbsp12:30 PM Medical Record Number: [id number] Patient Account Number: [id number] Date of Birth/Gender: Treating RN: Apr 08, 1936 (86 y.o. Mare Ferrari Primary Care  Physician: [name] Other Clinician: Referring Physician: Treating Physician/Extender: [name] in Treatment: 3 Education Assessment Education Provided To: Patient and Caregiver Education Topics Provided Wound/Skin Impairment: Methods: Explain/Verbal Responses: State content correctly Electronic Signature(s) Signed: 05/26/2022 4:56:45 PM By: Sharyn Creamer RN, BSN Entered By: Sharyn Creamer on 05/26/2022 12:58:23 -------------------------------------------------------------------------------- Wound Assessment Details Patient Name: Date of Service: [name], [name] 05/26/2022 12:30 PM Medical Record Number: [id number] Patient Account Number: [id number] Date of Birth/Sex: Treating RN: 01-23-1936 (86 y.o. Mare Ferrari Primary Care Compton Brigance: [name] Other Clinician: Referring Armond Cuthrell: Treating Cassadee Vanzandt/Extender: [name] in Treatment: 3 Wound Status Wound Number: 2 Primary Venous Leg Ulcer Etiology: Wound Location: Left, Posterior Lower Leg Secondary Lymphedema Wounding Event: Gradually Appeared Etiology: Date Acquired: 03/30/2022 Wound Open Weeks Of Treatment: 3 Status: Clustered Wound: No Comorbid Glaucoma, Arrhythmia, Coronary Artery Disease, Hypertension, History: Peripheral Venous Disease, Osteoarthritis Photos Wound Measurements Length: (cm) 1.3 Width: (cm) 3.2 Depth: (cm) 0.1 Area: (cm) 3.267 Volume: (cm) 0.327 % Reduction in Area: 67.4% % Reduction in Volume: 67.3% Epithelialization: Medium (34-66%) Tunneling: No Undermining: No Wound Description Classification: Full Thickness Without Exposed Support Structures Wound Margin: Indistinct, nonvisible Exudate Amount: Medium Exudate Type: Serous Exudate Color: amber Foul Odor After Cleansing: No Slough/Fibrino Yes Wound Bed Granulation Amount: None Present (0%) Exposed Structure Necrotic Amount: Large (67-100%) Fascia Exposed: No Necrotic  Quality: Eschar, Adherent Slough Fat Layer (Subcutaneous Tissue) Exposed: Yes Tendon Exposed: No Muscle Exposed: No Joint Exposed: No Bone Exposed: No Treatment Notes Wound #2 (Lower Leg) Wound Laterality: Left, Posterior Cleanser Peri-Wound Care Sween Lotion (Moisturizing lotion) Discharge Instruction: Apply moisturizing lotion as directed Topical Primary Dressing KerraCel Ag Gelling Fiber Dressing, 2x2 in (silver alginate) Discharge Instruction: Apply silver alginate to wound bed as instructed Secondary Dressing Woven Gauze Sponge, Non-Sterile 4x4 in Discharge Instruction: Apply over primary dressing as directed. Secured With Compression Wrap ThreePress (3 layer compression wrap) Discharge Instruction: Apply three layer compression as directed. Compression Stockings Add-Ons Electronic Signature(s) Signed: 05/26/2022 4:56:45 PM By: Sharyn Creamer RN, BSN Entered By: Sharyn Creamer on 05/26/2022 13:02:55 -------------------------------------------------------------------------------- [place] Details Patient Name: Date of Service: [name], [name] 05/26/2022 12:30 PM Medical Record Number: [id number] Patient Account Number: [id number] Date of Birth/Sex: Treating RN: [birth year] (86 y.o. Mare Ferrari Primary Care Doriana Mazurkiewicz: [name] Other Clinician: Referring Sudiksha Victor: Treating Garon Melander/Extender: [name] in Treatment: 3 Vital Signs Time Taken: 12:45 Temperature (F): 97.8 Height (in): 65 Pulse (bpm): 64 Weight (lbs): 195 Respiratory Rate (breaths/min): 18 Body Mass Index (BMI): 32.4 Blood Pressure (mmHg): 136/79 Reference Range: 80 - 120 mg / dl Electronic Signature(s) Signed: 05/26/2022 4:56:45 PM By: Sharyn Creamer RN, BSN Entered By: Sharyn Creamer on 05/26/2022 12:45:35

## 2022-05-27 NOTE — Progress Notes (Signed)
[name], SALTS ([id number]) Visit Report for 05/26/2022 Chief Complaint Document Details Patient Name: Date of Service: [name], [name] 05/26/2022 12:30 PM Medical Record Number: [id number] Patient Account Number: [id number] Date of Birth/Sex: Treating RN: 20-Jan-1936 (86 y.o. [name] Primary Care Provider: [name] Other Clinician: Referring Provider: Treating Provider/Extender: [name] in Treatment: 3 Information Obtained from: Patient Chief Complaint Left lower extremity wound Electronic Signature(s) Signed: 05/26/2022 1:22:46 PM By: Fredirick Maudlin MD FACS Entered By: Fredirick Maudlin on 05/26/2022 13:22:46 -------------------------------------------------------------------------------- Debridement Details Patient Name: Date of Service: [name], [name] 05/26/2022 12:30 PM Medical Record Number: [id number] Patient Account Number: [id number] Date of Birth/Sex: Treating RN: July 23, 1936 (86 y.o. [name] Primary Care Provider: [name] Other Clinician: Referring Provider: Treating Provider/Extender: [name] in Treatment: 3 Debridement Performed for Assessment: Wound #2 Left,Posterior Lower Leg Performed By: Physician Fredirick Maudlin, MD Debridement Type: Debridement Severity of Tissue Pre Debridement: Fat layer exposed Level of Consciousness (Pre-procedure): Awake and Alert Pre-procedure Verification/Time Out Yes - 13:02 Taken: Start Time: 13:05 Pain Control: Lidocaine 5% topical ointment T Area Debrided (L x W): otal 1.3 (cm) x 3.2 (cm) = 4.16 (cm) Tissue and other material debrided: Non-Viable, Eschar, Slough, Slough Level: Non-Viable Tissue Debridement Description: Selective/Open Wound Instrument: Curette Bleeding: Minimum Hemostasis Achieved: Pressure Procedural Pain: 0 Post Procedural Pain: 0 Response to Treatment: Procedure was tolerated well Level of Consciousness (Post- Awake and  Alert procedure): Post Debridement Measurements of Total Wound Length: (cm) 1.3 Width: (cm) 3.2 Depth: (cm) 0.1 Volume: (cm) 0.327 Character of Wound/Ulcer Post Debridement: Improved Severity of Tissue Post Debridement: Fat layer exposed Post Procedure Diagnosis Same as Pre-procedure Electronic Signature(s) Signed: 05/26/2022 1:40:22 PM By: Fredirick Maudlin MD FACS Signed: 05/26/2022 4:56:45 PM By: Sharyn Creamer RN, BSN Entered By: Sharyn Creamer on 05/26/2022 13:06:46 -------------------------------------------------------------------------------- HPI Details Patient Name: Date of Service: [name], [name] 05/26/2022 12:30 PM Medical Record Number: [id number] Patient Account Number: [id number] Date of Birth/Sex: Treating RN: 1936-05-11 (86 y.o. [name] Primary Care Provider: [name] Other Clinician: Referring Provider: Treating Provider/Extender: [name] in Treatment: 3 History of Present Illness HPI Description: [name] is an 86 year old male with a past medical history of CAD s/p DES to LCx 2006, carotid artery disease, hypertension, hyperlipidemia, paroxysmal atrial fibrillation on Eliquis and Cardizem that presents today for right lower extremity wound. He was seen by Dr. Larose Kells, his primary care provider on 4/29 for this issue and was referred to our clinic. Patient states that he had a small wound that spontaneously started in October 2021 and has not healed. He states this has progressively gotten larger. He has been using acne cream prescribed by the dermatologist for this issue. He reports drainage to the wound but no purulent drainage. He reports mild soreness to the wound. He has swelling in his right leg greater than left and reports this is a chronic issue for the past 1 to 2 years. He denies resting leg pain or pain with ambulation. Of note He was prescribed doxycycline at the end of last month and has finished this course for possible  skin infection to the wound site. 02/06/2021; I am seeing this patient who was admitted to the clinic last week by Dr. Heber [place]. He has predominantly I think a venous insufficiency wound on the right medial lower leg he has been using Santyl Hydrofera Blue under 3 layer compression. Arterial studies were ordered  last week but do not seem to been put through. He is not a diabetic. He did have venous reflux studies done in August 2020. He did have abnormal reflux time was noted in the great saphenous vein in the distal thigh and the great saphenous vein at the knee. There was no evidence of DVT or SVT at the time he was not felt to have large enough saphenous veins on either side for intervention. Compression stockings at least knee-high were recommended. Follow-up was on a as needed basis with Dr. Donzetta Matters The patient does not describe claudication but his pulses in his feet are not vibrant this could be because of the swelling 5/26; patient presents for 1 week follow-up. He has been using Hydrofera Blue under 3 layer compression. He had arterial studies done. He has no issues or complaints today. He denies signs of infection. He has his juxta light compressions with him today. 6/3; patient presents for 1 week follow-up. He has been using Hydrofera Blue under 3 layer compression. He denies signs and symptoms of infection. He did have bright green drainage which he states he has not seen before when the dressing was taken off today. He is using his juxta light compression to the left leg. 6/10; patient presents for 1 week follow-up. He has been tolerating Hydrofera Blue with 3 layer compression. He again reports bright green drainage. However he denies any signs of infection. He has been using juxta light compression to the left leg. 6/24; patient presents for 2-week follow-up. He has been tolerating Hydrofera Blue with 3 layer compression. Today he is healed. He brought his juxta lite compression for the  right leg and continues to wear the juxta light compression on the left leg READMISSION 05/05/2022 The patient returns to clinic today with a new wound on his left posterior leg. He has been wearing his juxta lite stockings on a regular basis, but on exam he still has 3+ pitting edema. I suspect he has lymphedema in addition to his known venous reflux. The wounds are scattered geographic wounds with light slough accumulation. They have been applying mupirocin and CeraVe ointment. He does have a VP shunt placement scheduled for next week, but it sounds like his neurosurgeon is not aware of the fact that he has an open wound. 05/11/2022: His VP shunt placement has been postponed while we get his wound to heal. The wounds are all smaller today with just a little eschar and minimal slough. Edema control is good. No concern for infection. 05/18/2022: All of the open areas have contracted and there is good perimeter epithelialization. There is a little bit of eschar and slough on the open areas. Good edema control. No concern for infection. 05/26/2022: Many of the small open areas have closed. There is Hydrofera Blue sponge stuck in a number of places and this is unable to be removed by the intake nurse. There is a fair amount of eschar and a little bit of slough present. Electronic Signature(s) Signed: 05/26/2022 1:23:26 PM By: Fredirick Maudlin MD FACS Entered By: Fredirick Maudlin on 05/26/2022 13:23:26 -------------------------------------------------------------------------------- Physical Exam Details Patient Name: Date of Service: [name], [name] 05/26/2022 12:30 PM Medical Record Number: [id number] Patient Account Number: [id number] Date of Birth/Sex: Treating RN: 1935/09/26 (86 y.o. [name] Primary Care Provider: [name] Other Clinician: Referring Provider: Treating Provider/Extender: [name] in Treatment: 3 Constitutional . . . . No acute  distress.Marland Kitchen Respiratory Normal work of breathing on room  air.. Notes 05/26/2022: Many of the small open areas have closed. There is Hydrofera Blue sponge stuck in a number of places and this is unable to be removed by the intake nurse. There is a fair amount of eschar and a little bit of slough present. Electronic Signature(s) Signed: 05/26/2022 1:23:53 PM By: Fredirick Maudlin MD FACS Entered By: Fredirick Maudlin on 05/26/2022 13:23:53 -------------------------------------------------------------------------------- Physician Orders Details Patient Name: Date of Service: [name], [name] 05/26/2022 12:30 PM Medical Record Number: [id number] Patient Account Number: [id number] Date of Birth/Sex: Treating RN: [birth year] (86 y.o. [name] Primary Care Provider: [name] Other Clinician: Referring Provider: Treating Provider/Extender: [name] in Treatment: 3 Verbal / Phone Orders: No Diagnosis Coding ICD-10 Coding Code Description I87.2 Venous insufficiency (chronic) (peripheral) I48.91 Unspecified atrial fibrillation I10 Essential (primary) hypertension I25.10 Atherosclerotic heart disease of native coronary artery without angina pectoris L97.822 Non-pressure chronic ulcer of other part of left lower leg with fat layer exposed Follow-up Appointments ppointment in 1 week. - Dr. Celine Ahr RM 1 with [facility] Return A Tuesday 06/02/22 @ 10:30pm Anesthetic (In clinic) Topical Lidocaine 5% applied to wound bed Bathing/ Shower/ Hygiene May shower with protection but do not get wound dressing(s) wet. - can purchase cast protector at [facility] or [facility] Edema Control - Lymphedema / SCD / Other Elevate legs to the level of the heart or above for 30 minutes daily and/or when sitting, a frequency of: - throughout the day Avoid standing for long periods of time. Exercise regularly - including ankle circles while sitting Compression stocking or Garment 20-30 mm/Hg pressure  to: - right leg daily Wound Treatment Wound #2 - Lower Leg Wound Laterality: Left, Posterior Peri-Wound Care: Sween Lotion (Moisturizing lotion) 1 x Per Week/30 Days Discharge Instructions: Apply moisturizing lotion as directed Prim Dressing: KerraCel Ag Gelling Fiber Dressing, 2x2 in (silver alginate) 1 x Per Week/30 Days ary Discharge Instructions: Apply silver alginate to wound bed as instructed Secondary Dressing: Woven Gauze Sponge, Non-Sterile 4x4 in 1 x Per Week/30 Days Discharge Instructions: Apply over primary dressing as directed. Compression Wrap: ThreePress (3 layer compression wrap) 1 x Per Week/30 Days Discharge Instructions: Apply three layer compression as directed. Electronic Signature(s) Signed: 05/26/2022 1:40:22 PM By: Fredirick Maudlin MD FACS Entered By: Fredirick Maudlin on 05/26/2022 13:24:04 -------------------------------------------------------------------------------- Problem List Details Patient Name: Date of Service: [name], [name] 05/26/2022 12:30 PM Medical Record Number: [id number] Patient Account Number: [id number] Date of Birth/Sex: Treating RN: [birth year] (86 y.o. [name] Primary Care Provider: [name] Other Clinician: Referring Provider: Treating Provider/Extender: [name] in Treatment: 3 Active Problems ICD-10 Encounter Code Description Active Date MDM Diagnosis I87.2 Venous insufficiency (chronic) (peripheral) 05/05/2022 No Yes I48.91 Unspecified atrial fibrillation 05/05/2022 No Yes I10 Essential (primary) hypertension 05/05/2022 No Yes I25.10 Atherosclerotic heart disease of native coronary artery without angina pectoris 05/05/2022 No Yes L97.822 Non-pressure chronic ulcer of other part of left lower leg with fat layer exposed8/15/2023 No Yes Inactive Problems Resolved Problems Electronic Signature(s) Signed: 05/26/2022 1:22:33 PM By: Fredirick Maudlin MD FACS Entered By: Fredirick Maudlin on 05/26/2022  13:22:33 -------------------------------------------------------------------------------- Progress Note Details Patient Name: Date of Service: [name], [name] 05/26/2022 12:30 PM Medical Record Number: [id number] Patient Account Number: [id number] Date of Birth/Sex: Treating RN: October 20, 1935 (86 y.o. [name] Primary Care Provider: [name] Other Clinician: Referring Provider: Treating Provider/Extender: [name] in Treatment: 3 Subjective Chief Complaint Information obtained from Patient  Left lower extremity wound History of Present Illness (HPI) [name] is an 86 year old male with a past medical history of CAD s/p DES to LCx 2006, carotid artery disease, hypertension, hyperlipidemia, paroxysmal atrial fibrillation on Eliquis and Cardizem that presents today for right lower extremity wound. He was seen by Dr. Larose Kells, his primary care provider on 4/29 for this issue and was referred to our clinic. Patient states that he had a small wound that spontaneously started in October 2021 and has not healed. He states this has progressively gotten larger. He has been using acne cream prescribed by the dermatologist for this issue. He reports drainage to the wound but no purulent drainage. He reports mild soreness to the wound. He has swelling in his right leg greater than left and reports this is a chronic issue for the past 1 to 2 years. He denies resting leg pain or pain with ambulation. Of note He was prescribed doxycycline at the end of last month and has finished this course for possible skin infection to the wound site. 02/06/2021; I am seeing this patient who was admitted to the clinic last week by Dr. Heber [place]. He has predominantly I think a venous insufficiency wound on the right medial lower leg he has been using Santyl Hydrofera Blue under 3 layer compression. Arterial studies were ordered last week but do not seem to been put through. He is not a diabetic.  He did have venous reflux studies done in August 2020. He did have abnormal reflux time was noted in the great saphenous vein in the distal thigh and the great saphenous vein at the knee. There was no evidence of DVT or SVT at the time he was not felt to have large enough saphenous veins on either side for intervention. Compression stockings at least knee-high were recommended. Follow-up was on a as needed basis with Dr. Donzetta Matters The patient does not describe claudication but his pulses in his feet are not vibrant this could be because of the swelling 5/26; patient presents for 1 week follow-up. He has been using Hydrofera Blue under 3 layer compression. He had arterial studies done. He has no issues or complaints today. He denies signs of infection. He has his juxta light compressions with him today. 6/3; patient presents for 1 week follow-up. He has been using Hydrofera Blue under 3 layer compression. He denies signs and symptoms of infection. He did have bright green drainage which he states he has not seen before when the dressing was taken off today. He is using his juxta light compression to the left leg. 6/10; patient presents for 1 week follow-up. He has been tolerating Hydrofera Blue with 3 layer compression. He again reports bright green drainage. However he denies any signs of infection. He has been using juxta light compression to the left leg. 6/24; patient presents for 2-week follow-up. He has been tolerating Hydrofera Blue with 3 layer compression. Today he is healed. He brought his juxta lite compression for the right leg and continues to wear the juxta light compression on the left leg READMISSION 05/05/2022 The patient returns to clinic today with a new wound on his left posterior leg. He has been wearing his juxta lite stockings on a regular basis, but on exam he still has 3+ pitting edema. I suspect he has lymphedema in addition to his known venous reflux. The wounds are scattered  geographic wounds with light slough accumulation. They have been applying mupirocin and CeraVe ointment. He does have a VP  shunt placement scheduled for next week, but it sounds like his neurosurgeon is not aware of the fact that he has an open wound. 05/11/2022: His VP shunt placement has been postponed while we get his wound to heal. The wounds are all smaller today with just a little eschar and minimal slough. Edema control is good. No concern for infection. 05/18/2022: All of the open areas have contracted and there is good perimeter epithelialization. There is a little bit of eschar and slough on the open areas. Good edema control. No concern for infection. 05/26/2022: Many of the small open areas have closed. There is Hydrofera Blue sponge stuck in a number of places and this is unable to be removed by the intake nurse. There is a fair amount of eschar and a little bit of slough present. Patient History Information obtained from Patient. Family History Cancer - Father, Diabetes - Maternal Grandparents, Heart Disease - Maternal Grandparents, Hypertension - Maternal Grandparents, No family history of Hereditary Spherocytosis, Kidney Disease, Lung Disease, Seizures, Stroke, Thyroid Problems, Tuberculosis. Social History Former smoker - Quit over 30 years ago, Marital Status - Married, Alcohol Use - Rarely, Drug Use - No History, Caffeine Use - Daily. Medical History Eyes Patient has history of Glaucoma Cardiovascular Patient has history of Arrhythmia - afib, Coronary Artery Disease, Hypertension, Peripheral Venous Disease Endocrine Denies history of Type I Diabetes, Type II Diabetes Genitourinary Denies history of End Stage Renal Disease Integumentary (Skin) Denies history of History of Burn Musculoskeletal Patient has history of Osteoarthritis Oncologic Denies history of Received Chemotherapy, Received Radiation Psychiatric Denies history of Anorexia/bulimia, Confinement  Anxiety Hospitalization/Surgery History - lumbar laminectomy. - total knee arthropathy right. - bil total hip arthroplasty. - coronary stents. - toe surgery. - tonsillectomy. Medical A Surgical History Notes nd Gastrointestinal colon polyps Endocrine Hypothyroidism Genitourinary BPH Musculoskeletal Bilat Hip Replacements, Right Knee Replacement, lumbar stenosis Neurologic hydrocephalus Oncologic Skin Cancer multiple sites Objective Constitutional No acute distress.. Vitals Time Taken: 12:45 PM, Height: 65 in, Weight: 195 lbs, BMI: 32.4, Temperature: 97.8 F, Pulse: 64 bpm, Respiratory Rate: 18 breaths/min, Blood Pressure: 136/79 mmHg. Respiratory Normal work of breathing on room air.. General Notes: 05/26/2022: Many of the small open areas have closed. There is Hydrofera Blue sponge stuck in a number of places and this is unable to be removed by the intake nurse. There is a fair amount of eschar and a little bit of slough present. Integumentary (Hair, Skin) Wound #2 status is Open. Original cause of wound was Gradually Appeared. The date acquired was: 03/30/2022. The wound has been in treatment 3 weeks. The wound is located on the Left,Posterior Lower Leg. The wound measures 1.3cm length x 3.2cm width x 0.1cm depth; 3.267cm^2 area and 0.327cm^3 volume. There is Fat Layer (Subcutaneous Tissue) exposed. There is no tunneling or undermining noted. There is a medium amount of serous drainage noted. The wound margin is indistinct and nonvisible. There is no granulation within the wound bed. There is a large (67-100%) amount of necrotic tissue within the wound bed including Eschar and Adherent Slough. Assessment Active Problems ICD-10 Venous insufficiency (chronic) (peripheral) Unspecified atrial fibrillation Essential (primary) hypertension Atherosclerotic heart disease of native coronary artery without angina pectoris Non-pressure chronic ulcer of other part of left lower leg with fat  layer exposed Procedures Wound #2 Pre-procedure diagnosis of Wound #2 is a Venous Leg Ulcer located on the Left,Posterior Lower Leg .Severity of Tissue Pre Debridement is: Fat layer exposed. There was a Selective/Open Wound Non-Viable Tissue Debridement  with a total area of 4.16 sq cm performed by Fredirick Maudlin, MD. With the following instrument(s): Curette to remove Non-Viable tissue/material. Material removed includes Eschar and Slough and after achieving pain control using Lidocaine 5% topical ointment. No specimens were taken. A time out was conducted at 13:02, prior to the start of the procedure. A Minimum amount of bleeding was controlled with Pressure. The procedure was tolerated well with a pain level of 0 throughout and a pain level of 0 following the procedure. Post Debridement Measurements: 1.3cm length x 3.2cm width x 0.1cm depth; 0.327cm^3 volume. Character of Wound/Ulcer Post Debridement is improved. Severity of Tissue Post Debridement is: Fat layer exposed. Post procedure Diagnosis Wound #2: Same as Pre-Procedure Plan Follow-up Appointments: Return Appointment in 1 week. - Dr. Celine Ahr RM 1 with [facility] Tuesday 06/02/22 @ 10:30pm Anesthetic: (In clinic) Topical Lidocaine 5% applied to wound bed Bathing/ Shower/ Hygiene: May shower with protection but do not get wound dressing(s) wet. - can purchase cast protector at [facility] or [facility] Edema Control - Lymphedema / SCD / Other: Elevate legs to the level of the heart or above for 30 minutes daily and/or when sitting, a frequency of: - throughout the day Avoid standing for long periods of time. Exercise regularly - including ankle circles while sitting Compression stocking or Garment 20-30 mm/Hg pressure to: - right leg daily WOUND #2: - Lower Leg Wound Laterality: Left, Posterior Peri-Wound Care: Sween Lotion (Moisturizing lotion) 1 x Per Week/30 Days Discharge Instructions: Apply moisturizing lotion as directed Prim Dressing:  KerraCel Ag Gelling Fiber Dressing, 2x2 in (silver alginate) 1 x Per Week/30 Days ary Discharge Instructions: Apply silver alginate to wound bed as instructed Secondary Dressing: Woven Gauze Sponge, Non-Sterile 4x4 in 1 x Per Week/30 Days Discharge Instructions: Apply over primary dressing as directed. Com pression Wrap: ThreePress (3 layer compression wrap) 1 x Per Week/30 Days Discharge Instructions: Apply three layer compression as directed. 05/26/2022: Many of the small open areas have closed. There is Hydrofera Blue sponge stuck in a number of places and this is unable to be removed by the intake nurse. There is a fair amount of eschar and a little bit of slough present. I used a curette to debride eschar, slough, and adherent wound dressing. Underneath a number of the areas with stuck down sponge, the wound is actually closed. There are just a couple of open areas remaining. I am going to change to silver alginate in hopes that we will it will stick less. Continue 3 layer compression. Follow-up in 1 week. Electronic Signature(s) Signed: 05/26/2022 1:24:50 PM By: Fredirick Maudlin MD FACS Entered By: Fredirick Maudlin on 05/26/2022 13:24:50 -------------------------------------------------------------------------------- HxROS Details Patient Name: Date of Service: [name], [name] 05/26/2022 12:30 PM Medical Record Number: [id number] Patient Account Number: [id number] Date of Birth/Sex: Treating RN: 1935/11/01 (86 y.o. [name] Primary Care Provider: [name] Other Clinician: Referring Provider: Treating Provider/Extender: [name] in Treatment: 3 Information Obtained From Patient Eyes Medical History: Positive for: Glaucoma Cardiovascular Medical History: Positive for: Arrhythmia - afib; Coronary Artery Disease; Hypertension; Peripheral Venous Disease Gastrointestinal Medical History: Past Medical History Notes: colon polyps Endocrine Medical  History: Negative for: Type I Diabetes; Type II Diabetes Past Medical History Notes: Hypothyroidism Genitourinary Medical History: Negative for: End Stage Renal Disease Past Medical History Notes: BPH Integumentary (Skin) Medical History: Negative for: History of Burn Musculoskeletal Medical History: Positive for: Osteoarthritis Past Medical History Notes: Bilat Hip Replacements, Right Knee Replacement, lumbar  stenosis Neurologic Medical History: Past Medical History Notes: hydrocephalus Oncologic Medical History: Negative for: Received Chemotherapy; Received Radiation Past Medical History Notes: Skin Cancer multiple sites Psychiatric Medical History: Negative for: Anorexia/bulimia; Confinement Anxiety HBO Extended History Items Eyes: Glaucoma Immunizations Pneumococcal Vaccine: Received Pneumococcal Vaccination: Yes Received Pneumococcal Vaccination On or After 60th Birthday: Yes Implantable Devices None Hospitalization / Surgery History Type of Hospitalization/Surgery lumbar laminectomy total knee arthropathy right bil total hip arthroplasty coronary stents toe surgery tonsillectomy Family and Social History Cancer: Yes - Father; Diabetes: Yes - Maternal Grandparents; Heart Disease: Yes - Maternal Grandparents; Hereditary Spherocytosis: No; Hypertension: Yes - Maternal Grandparents; Kidney Disease: No; Lung Disease: No; Seizures: No; Stroke: No; Thyroid Problems: No; Tuberculosis: No; Former smoker - Quit over 30 years ago; Marital Status - Married; Alcohol Use: Rarely; Drug Use: No History; Caffeine Use: Daily; Financial Concerns: No; Food, Clothing or Shelter Needs: No; Support System Lacking: No; Transportation Concerns: No Engineer, maintenance) Signed: 05/26/2022 1:40:22 PM By: Fredirick Maudlin MD FACS Signed: 05/27/2022 5:54:13 PM By: Baruch Gouty RN, BSN Entered By: Fredirick Maudlin on 05/26/2022  13:23:33 -------------------------------------------------------------------------------- SuperBill Details Patient Name: Date of Service: [name], [name] 05/26/2022 Medical Record Number: [id number] Patient Account Number: [id number] Date of Birth/Sex: Treating RN: Apr 18, 1936 (86 y.o. [name] Primary Care Provider: [name] Other Clinician: Referring Provider: Treating Provider/Extender: [name] in Treatment: 3 Diagnosis Coding ICD-10 Codes Code Description I87.2 Venous insufficiency (chronic) (peripheral) I48.91 Unspecified atrial fibrillation I10 Essential (primary) hypertension I25.10 Atherosclerotic heart disease of native coronary artery without angina pectoris L97.822 Non-pressure chronic ulcer of other part of left lower leg with fat layer exposed Facility Procedures CPT4 Code: [id number] Description: [phone] - [facility] 1ST 20 SQ CM OR < ICD-10 Diagnosis Description L97.822 Non-pressure chronic ulcer of other part of left lower leg with fat layer expose Modifier: d Quantity: 1 Physician Procedures : CPT4 Code Description Modifier [id number] - WC PHYS LEVEL 3 - EST PT 25 ICD-10 Diagnosis Description L97.822 Non-pressure chronic ulcer of other part of left lower leg with fat layer exposed I87.2 Venous insufficiency (chronic) (peripheral) I25.10  Atherosclerotic heart disease of native coronary artery without angina pectoris I48.91 Unspecified atrial fibrillation Quantity: 1 : [phone] [id number] - WC PHYS DEBR WO ANESTH 20 SQ CM ICD-10 Diagnosis Description L97.822 Non-pressure chronic ulcer of other part of left lower leg with fat layer exposed Quantity: 1 Electronic Signature(s) Signed: 05/26/2022 1:25:07 PM By: Fredirick Maudlin MD FACS Entered By: Fredirick Maudlin on 05/26/2022 13:25:07

## 2022-05-28 ENCOUNTER — Encounter: Payer: Self-pay | Admitting: Internal Medicine

## 2022-05-28 ENCOUNTER — Ambulatory Visit (INDEPENDENT_AMBULATORY_CARE_PROVIDER_SITE_OTHER): Payer: Medicare Other | Admitting: Internal Medicine

## 2022-05-28 VITALS — BP 126/82 | HR 61 | Temp 97.7°F | Resp 18 | Ht 69.0 in | Wt 198.1 lb

## 2022-05-28 DIAGNOSIS — G912 (Idiopathic) normal pressure hydrocephalus: Secondary | ICD-10-CM | POA: Diagnosis not present

## 2022-05-28 DIAGNOSIS — I482 Chronic atrial fibrillation, unspecified: Secondary | ICD-10-CM

## 2022-05-28 DIAGNOSIS — E039 Hypothyroidism, unspecified: Secondary | ICD-10-CM

## 2022-05-28 DIAGNOSIS — E782 Mixed hyperlipidemia: Secondary | ICD-10-CM | POA: Diagnosis not present

## 2022-05-28 DIAGNOSIS — I10 Essential (primary) hypertension: Secondary | ICD-10-CM

## 2022-05-28 LAB — BASIC METABOLIC PANEL
BUN: 18 mg/dL (ref 6–23)
CO2: 27 mEq/L (ref 19–32)
Calcium: 9.5 mg/dL (ref 8.4–10.5)
Chloride: 103 mEq/L (ref 96–112)
Creatinine, Ser: 1.11 mg/dL (ref 0.40–1.50)
GFR: 61.11 mL/min (ref >60.00)
Glucose, Bld: 79 mg/dL (ref 70–99)
Potassium: 4.3 mEq/L (ref 3.5–5.1)
Sodium: 139 mEq/L (ref 135–145)

## 2022-05-28 LAB — LIPID PANEL
Cholesterol: 122 mg/dL (ref 0–200)
HDL: 41.9 mg/dL (ref >39.00)
LDL Cholesterol: 56 mg/dL (ref 0–99)
NonHDL: 79.67
Total CHOL/HDL Ratio: 3
Triglycerides: 116 mg/dL (ref 0.0–149.0)
VLDL: 23.2 mg/dL (ref 0.0–40.0)

## 2022-05-28 LAB — TSH: TSH: 4.79 u[IU]/mL (ref 0.35–5.50)

## 2022-05-28 LAB — CBC WITH DIFFERENTIAL/PLATELET
Basophils Absolute: 0.1 10*3/uL (ref 0.0–0.1)
Basophils Relative: 1.2 % (ref 0.0–3.0)
Eosinophils Absolute: 0.6 10*3/uL (ref 0.0–0.7)
Eosinophils Relative: 7.6 % — ABNORMAL HIGH (ref 0.0–5.0)
HCT: 47.4 % (ref 39.0–52.0)
Hemoglobin: 15.5 g/dL (ref 13.0–17.0)
Lymphocytes Relative: 17.5 % (ref 12.0–46.0)
Lymphs Abs: 1.4 10*3/uL (ref 0.7–4.0)
MCHC: 32.7 g/dL (ref 30.0–36.0)
MCV: 91.2 fl (ref 78.0–100.0)
Monocytes Absolute: 0.9 10*3/uL (ref 0.1–1.0)
Monocytes Relative: 11.1 % (ref 3.0–12.0)
Neutro Abs: 5 10*3/uL (ref 1.4–7.7)
Neutrophils Relative %: 62.6 % (ref 43.0–77.0)
Platelets: 237 10*3/uL (ref 150.0–400.0)
RBC: 5.2 Mil/uL (ref 4.22–5.81)
RDW: 17 % — ABNORMAL HIGH (ref 11.5–15.5)
WBC: 8 10*3/uL (ref 4.0–10.5)

## 2022-05-28 NOTE — Assessment & Plan Note (Signed)
  Hyperglycemia: A1c has been stable over time. HTN: BP today is very good, at home is checked once monthly is normal.  Continue benazepril, diltiazem, Lasix.  Check BMP Hyperlipidemia: On Lipitor 40 mg, check FLP.  Reports no side effects.   Gait disorder: See last visit, did physical therapy, did not feel it was particularly helpful.  Uses a cane, has a walker at home but does not use it.  Benefits of a walker and fall prevention discussed. Hypothyroidism: Good compliance, check TSH, further advised the results. Normal pressure hydrocephalus-  saw neuro, had a CT, brain MRI and subsequently LP (large-volume) with an improved on gait and alertness , subsequently saw neurosurgery and they agree on ventriculoperitoneal shunt insertion (pending) Memory issues?  I ask about it, he is occasionally forgetful but not confused. Preventive care: Extensive discussion about COVID-vaccine (pros >> cons), he remains skeptical. RTC 4 months

## 2022-05-28 NOTE — Progress Notes (Signed)
Subjective:    Patient ID: [name], male    DOB: 1936-06-30, 86 y.o.   MRN: [id number]  DOS:  05/28/2022 Type of visit - description: Routine checkup, here with his wife  Since the last office visit he is doing about the same. Was seen by neurology and neurosurgery for evaluation of normal pressure hydrocephalus, notes reviewed. Ambulatory BPs are checked once a month and they are normal. S/p physical therapy for gait disorder.  No further falls. I ask about memory issues, wife reports he is occasionally forgetful but not confused  Review of Systems See above   Past Medical History:  Diagnosis Date   Arthritis    BPH (benign prostatic hypertrophy)    Carotid artery disease ([place])    Doppler, December 08, 2011, 00 56% R. ICA, 81-27% LICA, followup 1 year   Coronary artery disease    DES Circumflex or CVA 2006  /  clear, February, 2011, EF 70%, no ischemia or   Ejection fraction    EF 60%, echo, 2009, mildly calcified aortic leaflets   History of colonic polyps    Hyperlipidemia    Hypertension    Hypothyroidism    Lumbar radiculopathy    Multiple thyroid nodules    Avascular echogenic areas noted in the right thyroid at the time of carotid Doppler.    PONV (postoperative nausea and vomiting)    "nausea with first hip surgery"   Skin cancer (melanoma) ([place])    PMH of    Spinal stenosis, lumbar    Tremor    Fine tremor right upper extremity   Venous insufficiency     Past Surgical History:  Procedure Laterality Date   COLONOSCOPY     CORONARY ANGIOPLASTY WITH STENT PLACEMENT  2006   x 2 stents; DES to CX and dRCA '06   KNEE SURGERY  1970's   "chipped bone"   LUMBAR LAMINECTOMY/DECOMPRESSION MICRODISCECTOMY Left 07/13/2016   Procedure: LUMBAR LAMINECTOMY AND FORAMINOTOMY Lumbar two, three, Lumbar three-four, Lumbar four-five, LEFT Lumbar five-Sacral one DISECTOMY;  Surgeon: Newman Pies, MD;  Location: [facility];  Service: Neurosurgery;  Laterality: Left;   TOE  SURGERY  1997   TONSILLECTOMY     TOTAL HIP ARTHROPLASTY Left 2007   TOTAL HIP ARTHROPLASTY Right 2010   TOTAL KNEE ARTHROPLASTY Right 06/25/2014   Procedure: RIGHT TOTAL KNEE ARTHROPLASTY;  Surgeon: Gearlean Alf, MD;  Location: [facility];  Service: Orthopedics;  Laterality: Right;    Current Outpatient Medications  Medication Instructions   apixaban (ELIQUIS) 5 MG TABS tablet TAKE 1 TABLET BY MOUTH TWICE DAILY   aspirin 81 mg, Oral, Daily   atorvastatin (LIPITOR) 40 MG tablet TAKE 1 AND 1/2 TABLETS(60 MG) BY MOUTH AT BEDTIME   benazepril (LOTENSIN) 20 MG tablet TAKE 1 AND 1/2 TABLETS(30 MG) BY MOUTH DAILY   diltiazem (DILACOR XR) 180 MG 24 hr capsule TAKE 1 CAPSULE(180 MG) BY MOUTH DAILY   furosemide (LASIX) 40 MG tablet TAKE 1 AND 1/2 TABLETS(60 MG) BY MOUTH TWICE DAILY   levothyroxine (SYNTHROID) 175 mcg, Oral, Daily   LUMIGAN 0.01 % SOLN 1 drop, Both Eyes, Daily at bedtime   metoprolol tartrate (LOPRESSOR) 25 MG tablet TAKE 1/2 TABLET(12.5 MG) BY MOUTH TWICE DAILY   nitroGLYCERIN (NITROSTAT) 0.4 MG SL tablet PLACE 1 TABLET UNDER THE TONGUE EVERY 5 MINUTES AS NEEDED FOR CHEST PAIN   Timolol Maleate 0.5 % (DAILY) SOLN 1 drop, Ophthalmic, 2 times daily, At breakfast and evening meals  Objective:   Physical Exam BP 126/82   Pulse 61   Temp 97.7 F (36.5 C) (Oral)   Resp 18   Ht '5\' 9"'$  (1.753 m)   Wt 198 lb 2 oz (89.9 kg)   SpO2 96%   BMI 29.26 kg/m  General:   Well developed, NAD, BMI noted. HEENT:  Normocephalic . Face symmetric, atraumatic Lungs:  CTA B Normal respiratory effort, no intercostal retractions, no accessory muscle use. Heart: Distant heart sounds.  Bradycardic Lower extremities: Both lower extremities are wrapped Skin: Not pale. Not jaundice Neurologic:  alert & oriented X3.  Speech normal, gait somewhat limited, uses a cane.   Psych--  Cognition and judgment appear intact.  Cooperative with normal attention span and concentration.  Behavior  appropriate. No anxious or depressed appearing.      Assessment    Assessment  (Transfer from  Dr. Linna Darner [phone]) Hyperglycemia, A1c 6.2 2013 HTN Hyperlipidemia Hypothyroidism  Thyroid US 2014-- no nodules, inhomogeneous  CV: --CAD: stents 2006;  low risk nuclear scan 2011, 03-2016 --A Fib dx 09/2018 at regular crads OV, on Eliquis, EF --Carotid artery disease [place] 12/2014:   Stable, 1-39% RICA stenosis. Stable, 60-45% LICA stenosis. [place] 12/2015  [place] 04/2020: 0-39% B R leg edema, [place] (-)   DVT 08-2014 and 01/2019 [facility] DJD: Dr Maureen Ralphs, back surgery Dr Arnoldo Morale 06-2016 Venous insufficiency, chronic R>L edema Tremor Glaucoma H/o skin cancer , denies Melanoma; sees derm, had a visit ~ 09/2017, Dr [place] Crane  PLAN: Hyperglycemia: A1c has been stable over time. HTN: BP today is very good, at home is checked once monthly is normal.  Continue benazepril, diltiazem, Lasix.  Check BMP Hyperlipidemia: On Lipitor 40 mg, check FLP.  Reports no side effects.   Gait disorder: See last visit, did physical therapy, did not feel it was particularly helpful.  Uses a cane, has a walker at home but does not use it.  Benefits of a walker and fall prevention discussed. Hypothyroidism: Good compliance, check TSH, further advised the results. Normal pressure hydrocephalus-  saw neuro, had a CT, brain MRI and subsequently LP (large-volume) with an improved on gait and alertness , subsequently saw neurosurgery and they agree on ventriculoperitoneal shunt insertion (pending) Memory issues?  I ask about it, he is occasionally forgetful but not confused. Preventive care: Extensive discussion about COVID-vaccine (pros >> cons), he remains skeptical. RTC 4 months

## 2022-05-28 NOTE — Patient Instructions (Addendum)
Recommend to proceed with the following vaccines at your pharmacy:  Flu shot-high dose Covid booster (bivalent)    Check the  blood pressure regularly BP GOAL is between 110/65 and  135/85. If it is consistently higher or lower, let me know       GO TO THE LAB : Get the blood work     [place], [place] back for a checkup in 4 months

## 2022-06-02 ENCOUNTER — Other Ambulatory Visit: Payer: Self-pay | Admitting: Internal Medicine

## 2022-06-02 ENCOUNTER — Encounter (HOSPITAL_BASED_OUTPATIENT_CLINIC_OR_DEPARTMENT_OTHER): Payer: Medicare Other | Admitting: General Surgery

## 2022-06-02 DIAGNOSIS — I872 Venous insufficiency (chronic) (peripheral): Secondary | ICD-10-CM | POA: Diagnosis not present

## 2022-06-02 DIAGNOSIS — I2510 Atherosclerotic heart disease of native coronary artery without angina pectoris: Secondary | ICD-10-CM | POA: Diagnosis not present

## 2022-06-02 DIAGNOSIS — L97222 Non-pressure chronic ulcer of left calf with fat layer exposed: Secondary | ICD-10-CM | POA: Diagnosis not present

## 2022-06-02 DIAGNOSIS — I480 Paroxysmal atrial fibrillation: Secondary | ICD-10-CM | POA: Diagnosis not present

## 2022-06-02 DIAGNOSIS — I890 Lymphedema, not elsewhere classified: Secondary | ICD-10-CM | POA: Diagnosis not present

## 2022-06-02 DIAGNOSIS — L97822 Non-pressure chronic ulcer of other part of left lower leg with fat layer exposed: Secondary | ICD-10-CM | POA: Diagnosis not present

## 2022-06-02 DIAGNOSIS — S81801A Unspecified open wound, right lower leg, initial encounter: Secondary | ICD-10-CM | POA: Diagnosis not present

## 2022-06-02 DIAGNOSIS — I10 Essential (primary) hypertension: Secondary | ICD-10-CM | POA: Diagnosis not present

## 2022-06-02 DIAGNOSIS — Z7901 Long term (current) use of anticoagulants: Secondary | ICD-10-CM | POA: Diagnosis not present

## 2022-06-02 NOTE — Progress Notes (Signed)
[name], [name] ([id number]) Visit Report for 06/02/2022 Chief Complaint Document Details Patient Name: Date of Service: [name], [name] 06/02/2022 10:30 A M Medical Record Number: [id number] Patient Account Number: [id number] Date of Birth/Sex: Treating RN: July 24, 1936 (86 y.o. M) Primary Care Provider: [name] Other Clinician: Referring Provider: Treating Provider/Extender: [name] in Treatment: 4 Information Obtained from: Patient Chief Complaint Left lower extremity wound Electronic Signature(s) Signed: 06/02/2022 11:32:26 AM By: Fredirick Maudlin MD FACS Entered By: Fredirick Maudlin on 06/02/2022 11:32:25 -------------------------------------------------------------------------------- Debridement Details Patient Name: Date of Service: [name], [name] 06/02/2022 10:30 A M Medical Record Number: [id number] Patient Account Number: [id number] Date of Birth/Sex: Treating RN: 1936-05-17 (86 y.o. [name] Primary Care Provider: [name] Other Clinician: Referring Provider: Treating Provider/Extender: [name] in Treatment: 4 Debridement Performed for Assessment: Wound #2 Boon Lower Leg Performed By: Physician Fredirick Maudlin, MD Debridement Type: Debridement Severity of Tissue Pre Debridement: Fat layer exposed Level of Consciousness (Pre-procedure): Awake and Alert Pre-procedure Verification/Time Out Yes - 11:15 Taken: Start Time: 11:15 Pain Control: Lidocaine 4% Topical Solution T Area Debrided (L x W): otal 2 (cm) x 1 (cm) = 2 (cm) Tissue and other material debrided: Non-Viable, Eschar Level: Non-Viable Tissue Debridement Description: Selective/Open Wound Instrument: Curette Bleeding: Minimum Hemostasis Achieved: Pressure Procedural Pain: 0 Post Procedural Pain: 0 Response to Treatment: Procedure was tolerated well Level of Consciousness (Post- Awake and Alert procedure): Post Debridement  Measurements of Total Wound Length: (cm) 0.8 Width: (cm) 0.4 Depth: (cm) 0.1 Volume: (cm) 0.025 Character of Wound/Ulcer Post Debridement: Improved Severity of Tissue Post Debridement: Fat layer exposed Post Procedure Diagnosis Same as Pre-procedure Notes scribed by Baruch Gouty, RN for Dr. Celine Ahr Electronic Signature(s) Signed: 06/02/2022 12:25:45 PM By: Fredirick Maudlin MD FACS Signed: 06/02/2022 4:55:43 PM By: Baruch Gouty RN, BSN Entered By: Baruch Gouty on 06/02/2022 11:19:52 -------------------------------------------------------------------------------- HPI Details Patient Name: Date of Service: [name], [name] 06/02/2022 10:30 A M Medical Record Number: [id number] Patient Account Number: [id number] Date of Birth/Sex: Treating RN: [birth year] (86 y.o. M) Primary Care Provider: [name] Other Clinician: Referring Provider: Treating Provider/Extender: [name] in Treatment: 4 History of Present Illness HPI Description: [name] is an 86 year old male with a past medical history of CAD s/p DES to LCx 2006, carotid artery disease, hypertension, hyperlipidemia, paroxysmal atrial fibrillation on Eliquis and Cardizem that presents today for right lower extremity wound. He was seen by Dr. Larose Kells, his primary care provider on 4/29 for this issue and was referred to our clinic. Patient states that he had a small wound that spontaneously started in October 2021 and has not healed. He states this has progressively gotten larger. He has been using acne cream prescribed by the dermatologist for this issue. He reports drainage to the wound but no purulent drainage. He reports mild soreness to the wound. He has swelling in his right leg greater than left and reports this is a chronic issue for the past 1 to 2 years. He denies resting leg pain or pain with ambulation. Of note He was prescribed doxycycline at the end of last month and has finished this course for  possible skin infection to the wound site. 02/06/2021; I am seeing this patient who was admitted to the clinic last week by Dr. Heber [place]. He has predominantly I think a venous insufficiency wound on the right medial lower leg he has been using Santyl Hydrofera Blue under 3  layer compression. Arterial studies were ordered last week but do not seem to been put through. He is not a diabetic. He did have venous reflux studies done in August 2020. He did have abnormal reflux time was noted in the great saphenous vein in the distal thigh and the great saphenous vein at the knee. There was no evidence of DVT or SVT at the time he was not felt to have large enough saphenous veins on either side for intervention. Compression stockings at least knee-high were recommended. Follow-up was on a as needed basis with Dr. Donzetta Matters The patient does not describe claudication but his pulses in his feet are not vibrant this could be because of the swelling 5/26; patient presents for 1 week follow-up. He has been using Hydrofera Blue under 3 layer compression. He had arterial studies done. He has no issues or complaints today. He denies signs of infection. He has his juxta light compressions with him today. 6/3; patient presents for 1 week follow-up. He has been using Hydrofera Blue under 3 layer compression. He denies signs and symptoms of infection. He did have bright green drainage which he states he has not seen before when the dressing was taken off today. He is using his juxta light compression to the left leg. 6/10; patient presents for 1 week follow-up. He has been tolerating Hydrofera Blue with 3 layer compression. He again reports bright green drainage. However he denies any signs of infection. He has been using juxta light compression to the left leg. 6/24; patient presents for 2-week follow-up. He has been tolerating Hydrofera Blue with 3 layer compression. Today he is healed. He brought his juxta lite compression  for the right leg and continues to wear the juxta light compression on the left leg READMISSION 05/05/2022 The patient returns to clinic today with a new wound on his left posterior leg. He has been wearing his juxta lite stockings on a regular basis, but on exam he still has 3+ pitting edema. I suspect he has lymphedema in addition to his known venous reflux. The wounds are scattered geographic wounds with light slough accumulation. They have been applying mupirocin and CeraVe ointment. He does have a VP shunt placement scheduled for next week, but it sounds like his neurosurgeon is not aware of the fact that he has an open wound. 05/11/2022: His VP shunt placement has been postponed while we get his wound to heal. The wounds are all smaller today with just a little eschar and minimal slough. Edema control is good. No concern for infection. 05/18/2022: All of the open areas have contracted and there is good perimeter epithelialization. There is a little bit of eschar and slough on the open areas. Good edema control. No concern for infection. 05/26/2022: Many of the small open areas have closed. There is Hydrofera Blue sponge stuck in a number of places and this is unable to be removed by the intake nurse. There is a fair amount of eschar and a little bit of slough present. 06/02/2022: Continued closure of small open skin sites. There are just a couple remaining and these have a little eschar on the surface. Edema control is excellent. Electronic Signature(s) Signed: 06/02/2022 11:33:00 AM By: Fredirick Maudlin MD FACS Entered By: Fredirick Maudlin on 06/02/2022 11:33:00 -------------------------------------------------------------------------------- Physical Exam Details Patient Name: Date of Service: [name], [name] 06/02/2022 10:30 A M Medical Record Number: [id number] Patient Account Number: [id number] Date of Birth/Sex: Treating RN: Dec 27, 1935 (86 y.o. M) Primary Care Provider:  [name] Other  Clinician: Referring Provider: Treating Provider/Extender: [name] in Treatment: 4 Constitutional . . . . No acute distress.Marland Kitchen Respiratory Normal work of breathing on room air.. Notes 06/02/2022: Continued closure of small open skin sites. There are just a couple remaining and these have a little eschar on the surface. Edema control is excellent. Electronic Signature(s) Signed: 06/02/2022 11:33:32 AM By: Fredirick Maudlin MD FACS Entered By: Fredirick Maudlin on 06/02/2022 11:33:32 -------------------------------------------------------------------------------- Physician Orders Details Patient Name: Date of Service: [name], [name] 06/02/2022 10:30 A M Medical Record Number: [id number] Patient Account Number: [id number] Date of Birth/Sex: Treating RN: 02-19-1936 (86 y.o. [name] Primary Care Provider: [name] Other Clinician: Referring Provider: Treating Provider/Extender: [name] in Treatment: 4 Verbal / Phone Orders: No Diagnosis Coding ICD-10 Coding Code Description I87.2 Venous insufficiency (chronic) (peripheral) I48.91 Unspecified atrial fibrillation I10 Essential (primary) hypertension I25.10 Atherosclerotic heart disease of native coronary artery without angina pectoris L97.822 Non-pressure chronic ulcer of other part of left lower leg with fat layer exposed Follow-up Appointments ppointment in 1 week. - Dr. Celine Ahr RM 1 with [facility] Return A Tuesday Anesthetic (In clinic) Topical Lidocaine 4% applied to wound bed Bathing/ Shower/ Hygiene May shower with protection but do not get wound dressing(s) wet. - can purchase cast protector at [facility] or [facility] Edema Control - Lymphedema / SCD / Other Elevate legs to the level of the heart or above for 30 minutes daily and/or when sitting, a frequency of: - throughout the day Avoid standing for long periods of time. Exercise regularly - including ankle circles while  sitting Compression stocking or Garment 20-30 mm/Hg pressure to: - right leg daily Wound Treatment Wound #2 - Lower Leg Wound Laterality: Left, Posterior Peri-Wound Care: Sween Lotion (Moisturizing lotion) 1 x Per Week/30 Days Discharge Instructions: Apply moisturizing lotion as directed Prim Dressing: KerraCel Ag Gelling Fiber Dressing, 2x2 in (silver alginate) 1 x Per Week/30 Days ary Discharge Instructions: Apply silver alginate to wound bed as instructed Secondary Dressing: Woven Gauze Sponge, Non-Sterile 4x4 in 1 x Per Week/30 Days Discharge Instructions: Apply over primary dressing as directed. Compression Wrap: ThreePress (3 layer compression wrap) 1 x Per Week/30 Days Discharge Instructions: Apply three layer compression as directed. Patient Medications llergies: No Known Allergies A Notifications Medication Indication Start End prior to debridement 06/02/2022 lidocaine DOSE topical 4 % cream - cream topical Electronic Signature(s) Signed: 06/02/2022 12:25:45 PM By: Fredirick Maudlin MD FACS Entered By: Fredirick Maudlin on 06/02/2022 11:33:45 -------------------------------------------------------------------------------- Problem List Details Patient Name: Date of Service: [name], [name] 06/02/2022 10:30 A M Medical Record Number: [id number] Patient Account Number: [id number] Date of Birth/Sex: Treating RN: 10/02/1935 (86 y.o. [name] Primary Care Provider: [name] Other Clinician: Referring Provider: Treating Provider/Extender: [name] in Treatment: 4 Active Problems ICD-10 Encounter Code Description Active Date MDM Diagnosis I87.2 Venous insufficiency (chronic) (peripheral) 05/05/2022 No Yes I48.91 Unspecified atrial fibrillation 05/05/2022 No Yes I10 Essential (primary) hypertension 05/05/2022 No Yes I25.10 Atherosclerotic heart disease of native coronary artery without angina pectoris 05/05/2022 No Yes L97.822 Non-pressure  chronic ulcer of other part of left lower leg with fat layer exposed8/15/2023 No Yes Inactive Problems Resolved Problems Electronic Signature(s) Signed: 06/02/2022 11:32:15 AM By: Fredirick Maudlin MD FACS Entered By: Fredirick Maudlin on 06/02/2022 11:32:14 -------------------------------------------------------------------------------- Progress Note Details Patient Name: Date of Service: [name], [name] 06/02/2022 10:30 A M Medical Record Number: [id number] Patient Account  Number: [id number] Date of Birth/Sex: Treating RN: May 28, 1936 (86 y.o. M) Primary Care Provider: [name] Other Clinician: Referring Provider: Treating Provider/Extender: [name] in Treatment: 4 Subjective Chief Complaint Information obtained from Patient Left lower extremity wound History of Present Illness (HPI) [name] is an 86 year old male with a past medical history of CAD s/p DES to LCx 2006, carotid artery disease, hypertension, hyperlipidemia, paroxysmal atrial fibrillation on Eliquis and Cardizem that presents today for right lower extremity wound. He was seen by Dr. Larose Kells, his primary care provider on 4/29 for this issue and was referred to our clinic. Patient states that he had a small wound that spontaneously started in October 2021 and has not healed. He states this has progressively gotten larger. He has been using acne cream prescribed by the dermatologist for this issue. He reports drainage to the wound but no purulent drainage. He reports mild soreness to the wound. He has swelling in his right leg greater than left and reports this is a chronic issue for the past 1 to 2 years. He denies resting leg pain or pain with ambulation. Of note He was prescribed doxycycline at the end of last month and has finished this course for possible skin infection to the wound site. 02/06/2021; I am seeing this patient who was admitted to the clinic last week by Dr. Heber [place]. He has  predominantly I think a venous insufficiency wound on the right medial lower leg he has been using Santyl Hydrofera Blue under 3 layer compression. Arterial studies were ordered last week but do not seem to been put through. He is not a diabetic. He did have venous reflux studies done in August 2020. He did have abnormal reflux time was noted in the great saphenous vein in the distal thigh and the great saphenous vein at the knee. There was no evidence of DVT or SVT at the time he was not felt to have large enough saphenous veins on either side for intervention. Compression stockings at least knee-high were recommended. Follow-up was on a as needed basis with Dr. Donzetta Matters The patient does not describe claudication but his pulses in his feet are not vibrant this could be because of the swelling 5/26; patient presents for 1 week follow-up. He has been using Hydrofera Blue under 3 layer compression. He had arterial studies done. He has no issues or complaints today. He denies signs of infection. He has his juxta light compressions with him today. 6/3; patient presents for 1 week follow-up. He has been using Hydrofera Blue under 3 layer compression. He denies signs and symptoms of infection. He did have bright green drainage which he states he has not seen before when the dressing was taken off today. He is using his juxta light compression to the left leg. 6/10; patient presents for 1 week follow-up. He has been tolerating Hydrofera Blue with 3 layer compression. He again reports bright green drainage. However he denies any signs of infection. He has been using juxta light compression to the left leg. 6/24; patient presents for 2-week follow-up. He has been tolerating Hydrofera Blue with 3 layer compression. Today he is healed. He brought his juxta lite compression for the right leg and continues to wear the juxta light compression on the left leg READMISSION 05/05/2022 The patient returns to clinic today  with a new wound on his left posterior leg. He has been wearing his juxta lite stockings on a regular basis, but on exam he still has 3+  pitting edema. I suspect he has lymphedema in addition to his known venous reflux. The wounds are scattered geographic wounds with light slough accumulation. They have been applying mupirocin and CeraVe ointment. He does have a VP shunt placement scheduled for next week, but it sounds like his neurosurgeon is not aware of the fact that he has an open wound. 05/11/2022: His VP shunt placement has been postponed while we get his wound to heal. The wounds are all smaller today with just a little eschar and minimal slough. Edema control is good. No concern for infection. 05/18/2022: All of the open areas have contracted and there is good perimeter epithelialization. There is a little bit of eschar and slough on the open areas. Good edema control. No concern for infection. 05/26/2022: Many of the small open areas have closed. There is Hydrofera Blue sponge stuck in a number of places and this is unable to be removed by the intake nurse. There is a fair amount of eschar and a little bit of slough present. 06/02/2022: Continued closure of small open skin sites. There are just a couple remaining and these have a little eschar on the surface. Edema control is excellent. Patient History Information obtained from Patient. Family History Cancer - Father, Diabetes - Maternal Grandparents, Heart Disease - Maternal Grandparents, Hypertension - Maternal Grandparents, No family history of Hereditary Spherocytosis, Kidney Disease, Lung Disease, Seizures, Stroke, Thyroid Problems, Tuberculosis. Social History Former smoker - Quit over 30 years ago, Marital Status - Married, Alcohol Use - Rarely, Drug Use - No History, Caffeine Use - Daily. Medical History Eyes Patient has history of Glaucoma Cardiovascular Patient has history of Arrhythmia - afib, Coronary Artery Disease,  Hypertension, Peripheral Venous Disease Endocrine Denies history of Type I Diabetes, Type II Diabetes Genitourinary Denies history of End Stage Renal Disease Integumentary (Skin) Denies history of History of Burn Musculoskeletal Patient has history of Osteoarthritis Oncologic Denies history of Received Chemotherapy, Received Radiation Psychiatric Denies history of Anorexia/bulimia, Confinement Anxiety Hospitalization/Surgery History - lumbar laminectomy. - total knee arthropathy right. - bil total hip arthroplasty. - coronary stents. - toe surgery. - tonsillectomy. Medical A Surgical History Notes nd Gastrointestinal colon polyps Endocrine Hypothyroidism Genitourinary BPH Musculoskeletal Bilat Hip Replacements, Right Knee Replacement, lumbar stenosis Neurologic hydrocephalus Oncologic Skin Cancer multiple sites Objective Constitutional No acute distress.. Vitals Time Taken: 10:49 AM, Height: 65 in, Weight: 195 lbs, BMI: 32.4, Temperature: 98.3 F, Pulse: 68 bpm, Respiratory Rate: 20 breaths/min, Blood Pressure: 125/72 mmHg. Respiratory Normal work of breathing on room air.. General Notes: 06/02/2022: Continued closure of small open skin sites. There are just a couple remaining and these have a little eschar on the surface. Edema control is excellent. Integumentary (Hair, Skin) Wound #2 status is Open. Original cause of wound was Gradually Appeared. The date acquired was: 03/30/2022. The wound has been in treatment 4 weeks. The wound is located on the Left,Posterior Lower Leg. The wound measures 0.8cm length x 0.4cm width x 0.1cm depth; 0.251cm^2 area and 0.025cm^3 volume. There is Fat Layer (Subcutaneous Tissue) exposed. There is no tunneling or undermining noted. There is a small amount of serosanguineous drainage noted. The wound margin is flat and intact. There is medium (34-66%) pink granulation within the wound bed. There is a medium (34-66%) amount of necrotic  tissue within the wound bed including Adherent Slough. Assessment Active Problems ICD-10 Venous insufficiency (chronic) (peripheral) Unspecified atrial fibrillation Essential (primary) hypertension Atherosclerotic heart disease of native coronary artery without angina pectoris Non-pressure chronic ulcer of  other part of left lower leg with fat layer exposed Procedures Wound #2 Pre-procedure diagnosis of Wound #2 is a Venous Leg Ulcer located on the Left,Posterior Lower Leg .Severity of Tissue Pre Debridement is: Fat layer exposed. There was a Selective/Open Wound Non-Viable Tissue Debridement with a total area of 2 sq cm performed by Fredirick Maudlin, MD. With the following instrument(s): Curette to remove Non-Viable tissue/material. Material removed includes Eschar after achieving pain control using Lidocaine 4% Topical Solution. No specimens were taken. A time out was conducted at 11:15, prior to the start of the procedure. A Minimum amount of bleeding was controlled with Pressure. The procedure was tolerated well with a pain level of 0 throughout and a pain level of 0 following the procedure. Post Debridement Measurements: 0.8cm length x 0.4cm width x 0.1cm depth; 0.025cm^3 volume. Character of Wound/Ulcer Post Debridement is improved. Severity of Tissue Post Debridement is: Fat layer exposed. Post procedure Diagnosis Wound #2: Same as Pre-Procedure General Notes: scribed by Baruch Gouty, RN for Dr. Celine Ahr. Pre-procedure diagnosis of Wound #2 is a Venous Leg Ulcer located on the Left,Posterior Lower Leg . There was a Three Layer Compression Therapy Procedure by Baruch Gouty, RN. Post procedure Diagnosis Wound #2: Same as Pre-Procedure Plan Follow-up Appointments: Return Appointment in 1 week. - Dr. Celine Ahr RM 1 with [facility] Tuesday Anesthetic: (In clinic) Topical Lidocaine 4% applied to wound bed Bathing/ Shower/ Hygiene: May shower with protection but do not get wound dressing(s)  wet. - can purchase cast protector at [facility] or [facility] Edema Control - Lymphedema / SCD / Other: Elevate legs to the level of the heart or above for 30 minutes daily and/or when sitting, a frequency of: - throughout the day Avoid standing for long periods of time. Exercise regularly - including ankle circles while sitting Compression stocking or Garment 20-30 mm/Hg pressure to: - right leg daily The following medication(s) was prescribed: lidocaine topical 4 % cream cream topical for prior to debridement was prescribed at facility WOUND #2: - Lower Leg Wound Laterality: Left, Posterior Peri-Wound Care: Sween Lotion (Moisturizing lotion) 1 x Per Week/30 Days Discharge Instructions: Apply moisturizing lotion as directed Prim Dressing: KerraCel Ag Gelling Fiber Dressing, 2x2 in (silver alginate) 1 x Per Week/30 Days ary Discharge Instructions: Apply silver alginate to wound bed as instructed Secondary Dressing: Woven Gauze Sponge, Non-Sterile 4x4 in 1 x Per Week/30 Days Discharge Instructions: Apply over primary dressing as directed. Com pression Wrap: ThreePress (3 layer compression wrap) 1 x Per Week/30 Days Discharge Instructions: Apply three layer compression as directed. 06/02/2022: Continued closure of small open skin sites. There are just a couple remaining and these have a little eschar on the surface. Edema control is excellent. I used a curette to debride the eschar from the wound surface. We will continue silver alginate with 3 layer compression. Follow-up in 1 week. Electronic Signature(s) Signed: 06/02/2022 11:34:05 AM By: Fredirick Maudlin MD FACS Entered By: Fredirick Maudlin on 06/02/2022 11:34:05 -------------------------------------------------------------------------------- HxROS Details Patient Name: Date of Service: [name], [name] 06/02/2022 10:30 A M Medical Record Number: [id number] Patient Account Number: [id number] Date of Birth/Sex: Treating RN: [birth year] (86 y.o.  M) Primary Care Provider: [name] Other Clinician: Referring Provider: Treating Provider/Extender: [name] in Treatment: 4 Information Obtained From Patient Eyes Medical History: Positive for: Glaucoma Cardiovascular Medical History: Positive for: Arrhythmia - afib; Coronary Artery Disease; Hypertension; Peripheral Venous Disease Gastrointestinal Medical History: Past Medical History Notes: colon polyps Endocrine Medical History: Negative  for: Type I Diabetes; Type II Diabetes Past Medical History Notes: Hypothyroidism Genitourinary Medical History: Negative for: End Stage Renal Disease Past Medical History Notes: BPH Integumentary (Skin) Medical History: Negative for: History of Burn Musculoskeletal Medical History: Positive for: Osteoarthritis Past Medical History Notes: Bilat Hip Replacements, Right Knee Replacement, lumbar stenosis Neurologic Medical History: Past Medical History Notes: hydrocephalus Oncologic Medical History: Negative for: Received Chemotherapy; Received Radiation Past Medical History Notes: Skin Cancer multiple sites Psychiatric Medical History: Negative for: Anorexia/bulimia; Confinement Anxiety HBO Extended History Items Eyes: Glaucoma Immunizations Pneumococcal Vaccine: Received Pneumococcal Vaccination: Yes Received Pneumococcal Vaccination On or After 60th Birthday: Yes Implantable Devices None Hospitalization / Surgery History Type of Hospitalization/Surgery lumbar laminectomy total knee arthropathy right bil total hip arthroplasty coronary stents toe surgery tonsillectomy Family and Social History Cancer: Yes - Father; Diabetes: Yes - Maternal Grandparents; Heart Disease: Yes - Maternal Grandparents; Hereditary Spherocytosis: No; Hypertension: Yes - Maternal Grandparents; Kidney Disease: No; Lung Disease: No; Seizures: No; Stroke: No; Thyroid Problems: No; Tuberculosis: No; Former smoker  - Quit over 30 years ago; Marital Status - Married; Alcohol Use: Rarely; Drug Use: No History; Caffeine Use: Daily; Financial Concerns: No; Food, Clothing or Shelter Needs: No; Support System Lacking: No; Transportation Concerns: No Electronic Signature(s) Signed: 06/02/2022 12:25:45 PM By: Fredirick Maudlin MD FACS Entered By: Fredirick Maudlin on 06/02/2022 11:33:09 -------------------------------------------------------------------------------- SuperBill Details Patient Name: Date of Service: [name], [name] 06/02/2022 Medical Record Number: [id number] Patient Account Number: [id number] Date of Birth/Sex: Treating RN: July 14, 1936 (86 y.o. 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 Facility Procedures CPT4 Code: [id number] Description: [phone] - DEBRIDE WOUND 1ST 20 SQ CM OR < ICD-10 Diagnosis Description L97.822 Non-pressure chronic ulcer of other part of left lower leg with fat layer expose Modifier: d Quantity: 1 Physician Procedures : CPT4 Code Description Modifier [id number] - WC PHYS LEVEL 3 - EST PT 25 ICD-10 Diagnosis Description L97.822 Non-pressure chronic ulcer of other part of left lower leg with fat layer exposed I87.2 Venous insufficiency (chronic) (peripheral) I25.10  Atherosclerotic heart disease of native coronary artery without angina pectoris Quantity: 1 : [id number] - WC PHYS DEBR WO ANESTH 20 SQ CM ICD-10 Diagnosis Description L97.822 Non-pressure chronic ulcer of other part of left lower leg with fat layer  exposed Quantity: 1 Electronic Signature(s) Signed: 06/02/2022 11:34:49 AM By: Fredirick Maudlin MD FACS Entered By: Fredirick Maudlin on 06/02/2022 11:34:49

## 2022-06-02 NOTE — Progress Notes (Signed)
[name] ([id number]) Visit Report for 06/02/2022 Arrival Information Details Patient Name: Date of Service: [name], [place] RD E. 06/02/2022 10:30 A M Medical Record Number: [id number] Patient Account Number: [id number] Date of Birth/Sex: Treating RN: 1936-04-29 (86 y.o. [name] Primary Care Amazin Pincock: [name] Other Clinician: Referring Adamae Ricklefs: Treating Arianis Bowditch/Extender: [name] in Treatment: 4 Visit Information History Since Last Visit Added or deleted any medications: No Patient Arrived: [name] Any new allergies or adverse reactions: No Arrival Time: 10:42 Had a fall or experienced change in No Accompanied By: spouse activities of daily living that may affect Transfer Assistance: None risk of falls: Patient Identification Verified: Yes Signs or symptoms of abuse/neglect since last visito No Secondary Verification Process Completed: Yes Hospitalized since last visit: No Patient Requires Transmission-Based Precautions: No Implantable device outside of the clinic excluding No Patient Has Alerts: Yes cellular tissue based products placed in the center Patient Alerts: R ABI = 1.09, TBI= .77 since last visit: L ABI = 1.02, TBI = .83 Has Dressing in Place as Prescribed: Yes Has Compression in Place as Prescribed: Yes Pain Present Now: No Electronic Signature(s) Signed: 06/02/2022 4:55:43 PM By: Baruch Gouty RN, BSN Entered By: Baruch Gouty on 06/02/2022 10:49:33 -------------------------------------------------------------------------------- Compression Therapy Details Patient Name: Date of Service: [name], [name] 06/02/2022 10:30 A M Medical Record Number: [id number] Patient Account Number: [id number] Date of Birth/Sex: Treating RN: December 27, 1935 (86 y.o. [name] Primary Care Didi Ganaway: [name] Other Clinician: Referring Quitman Norberto: Treating Jamilee Lafosse/Extender: [name] in Treatment: 4 Compression  Therapy Performed for Wound Assessment: Wound #2 Stanton Lower Leg Performed By: Clinician Baruch Gouty, RN Compression Type: Three Layer Post Procedure Diagnosis Same as Pre-procedure Electronic Signature(s) Signed: 06/02/2022 4:55:43 PM By: Baruch Gouty RN, BSN Entered By: Baruch Gouty on 06/02/2022 11:14:22 -------------------------------------------------------------------------------- Encounter Discharge Information Details Patient Name: Date of Service: [name], [name] 06/02/2022 10:30 A M Medical Record Number: [id number] Patient Account Number: [id number] Date of Birth/Sex: Treating RN: 1936-02-26 (86 y.o. [name] Primary Care Shrika Milos: [name] Other Clinician: Referring Ercel Pepitone: Treating Veronique Warga/Extender: [name] in Treatment: 4 Encounter Discharge Information Items Post Procedure Vitals Discharge Condition: Stable Temperature (F): 98.3 Ambulatory Status: Cane Pulse (bpm): 68 Discharge Destination: Home Respiratory Rate (breaths/min): 18 Transportation: Private Auto Blood Pressure (mmHg): 125/72 Accompanied By: spouse Schedule Follow-up Appointment: Yes Clinical Summary of Care: Patient Declined Electronic Signature(s) Signed: 06/02/2022 4:55:43 PM By: Baruch Gouty RN, BSN Entered By: Baruch Gouty on 06/02/2022 11:31:47 -------------------------------------------------------------------------------- Lower Extremity Assessment Details Patient Name: Date of Service: [name], [name] 06/02/2022 10:30 A M Medical Record Number: [id number] Patient Account Number: [id number] Date of Birth/Sex: Treating RN: 1935-12-02 (86 y.o. [name] Primary Care Dhalia Zingaro: [name] Other Clinician: Referring Rawlin Reaume: Treating Marcy Sookdeo/Extender: [name] in Treatment: 4 Edema Assessment Assessed: [Left: No] [Right: No] Edema: [Left: Ye] [Right: s] Calf Left: Right: Point of  Measurement: 30 cm From Medial Instep 36 cm Ankle Left: Right: Point of Measurement: 13 cm From Medial Instep 22.5 cm Vascular Assessment Pulses: Dorsalis Pedis Palpable: [Left:Yes] Electronic Signature(s) Signed: 06/02/2022 4:55:43 PM By: Baruch Gouty RN, BSN Entered By: Baruch Gouty on 06/02/2022 10:53:58 -------------------------------------------------------------------------------- Multi Wound Chart Details Patient Name: Date of Service: [name], [name] 06/02/2022 10:30 A M Medical Record Number: [id number] Patient Account Number: [id number] Date of Birth/Sex: Treating RN: 06/10/1936 (86 y.o. M) Primary Care Dimetri Armitage: [name] Other  Clinician: Referring Kani Chauvin: Treating Terriona Horlacher/Extender: Tonette Lederer, Ashley Murrain in Treatment: 4 Vital Signs Height(in): 65 Pulse(bpm): 74 Weight(lbs): 195 Blood Pressure(mmHg): 125/72 Body Mass Index(BMI): 32.4 Temperature(F): 98.3 Respiratory Rate(breaths/min): 20 Photos: [N/A:N/A] Left, Posterior Lower Leg N/A N/A Wound Location: Gradually Appeared N/A N/A Wounding Event: Venous Leg Ulcer N/A N/A Primary Etiology: Lymphedema N/A N/A Secondary Etiology: Glaucoma, Arrhythmia, Coronary N/A N/A Comorbid History: Artery Disease, Hypertension, Peripheral Venous Disease, Osteoarthritis 03/30/2022 N/A N/A Date Acquired: 4 N/A N/A Weeks of Treatment: Open N/A N/A Wound Status: No N/A N/A Wound Recurrence: 0.8x0.4x0.1 N/A N/A Measurements L x W x D (cm) 0.251 N/A N/A A (cm) : rea 0.025 N/A N/A Volume (cm) : 97.50% N/A N/A % Reduction in A rea: 97.50% N/A N/A % Reduction in Volume: Full Thickness Without Exposed N/A N/A Classification: Support Structures Small N/A N/A Exudate A mount: Serosanguineous N/A N/A Exudate Type: red, brown N/A N/A Exudate Color: Flat and Intact N/A N/A Wound Margin: Medium (34-66%) N/A N/A Granulation A mount: Pink N/A N/A Granulation Quality: Medium (34-66%) N/A  N/A Necrotic A mount: Fat Layer (Subcutaneous Tissue): Yes N/A N/A Exposed Structures: Fascia: No Tendon: No Muscle: No Joint: No Bone: No Medium (34-66%) N/A N/A Epithelialization: Debridement - Selective/Open Wound N/A N/A Debridement: Pre-procedure Verification/Time Out 11:15 N/A N/A Taken: Lidocaine 4% Topical Solution N/A N/A Pain Control: Necrotic/Eschar N/A N/A Tissue Debrided: Non-Viable Tissue N/A N/A Level: 2 N/A N/A Debridement A (sq cm): rea Curette N/A N/A Instrument: Minimum N/A N/A Bleeding: Pressure N/A N/A Hemostasis A chieved: 0 N/A N/A Procedural Pain: 0 N/A N/A Post Procedural Pain: Procedure was tolerated well N/A N/A Debridement Treatment Response: 0.8x0.4x0.1 N/A N/A Post Debridement Measurements L x W x D (cm) 0.025 N/A N/A Post Debridement Volume: (cm) Compression Therapy N/A N/A Procedures Performed: Debridement Treatment Notes Wound #2 (Lower Leg) Wound Laterality: Left, Posterior Cleanser Peri-Wound Care Sween Lotion (Moisturizing lotion) Discharge Instruction: Apply moisturizing lotion as directed Topical Primary Dressing KerraCel Ag Gelling Fiber Dressing, 2x2 in (silver alginate) Discharge Instruction: Apply silver alginate to wound bed as instructed Secondary Dressing Woven Gauze Sponge, Non-Sterile 4x4 in Discharge Instruction: Apply over primary dressing as directed. Secured With Compression Wrap ThreePress (3 layer compression wrap) Discharge Instruction: Apply three layer compression as directed. Compression Stockings Add-Ons Electronic Signature(s) Signed: 06/02/2022 11:32:19 AM By: Fredirick Maudlin MD FACS Entered By: Fredirick Maudlin on 06/02/2022 11:32:19 -------------------------------------------------------------------------------- Multi-Disciplinary Care Plan Details Patient Name: Date of Service: [name], [name] 06/02/2022 10:30 A M Medical Record Number: [id number] Patient Account Number:  [id number] Date of Birth/Sex: Treating RN: 04/20/1936 (86 y.o. [name] Primary Care Sabree Nuon: [name] Other Clinician: Referring Dilan Novosad: Treating Najia Hurlbutt/Extender: [name] in Treatment: [place] reviewed with physician Active Inactive Venous Leg Ulcer Nursing Diagnoses: Knowledge deficit related to disease process and management Goals: Patient will maintain optimal edema control Date Initiated: 06/02/2022 Target Resolution Date: 06/15/2022 Goal Status: Active Interventions: Assess peripheral edema status every visit. Compression as ordered Treatment Activities: Therapeutic compression applied : 06/02/2022 Notes: Wound/Skin Impairment Nursing Diagnoses: Impaired tissue integrity Goals: Patient/caregiver will verbalize understanding of skin care regimen Date Initiated: 06/02/2022 Target Resolution Date: 06/15/2022 Goal Status: Active Ulcer/skin breakdown will have a volume reduction of 30% by week 4 Date Initiated: 05/18/2022 Target Resolution Date: 06/15/2022 Goal Status: Active Interventions: Assess patient/caregiver ability to obtain necessary supplies Assess patient/caregiver ability to perform ulcer/skin care regimen upon admission and as needed Treatment Activities: Skin care regimen initiated :  05/18/2022 Notes: Electronic Signature(s) Signed: 06/02/2022 4:55:43 PM By: Baruch Gouty RN, BSN Entered By: Baruch Gouty on 06/02/2022 11:07:26 -------------------------------------------------------------------------------- Pain Assessment Details Patient Name: Date of Service: [name], [name] 06/02/2022 10:30 A M Medical Record Number: [id number] Patient Account Number: [id number] Date of Birth/Sex: Treating RN: [birth year] (86 y.o. [name] Primary Care Xzander Gilham: [name] Other Clinician: Referring Meiah Zamudio: Treating Bailey Faiella/Extender: [name] in Treatment: 4 Active  Problems Location of Pain Severity and Description of Pain Patient Has Paino No Site Locations Rate the pain. Current Pain Level: 0 Pain Management and Medication Current Pain Management: Electronic Signature(s) Signed: 06/02/2022 4:55:43 PM By: Baruch Gouty RN, BSN Entered By: Baruch Gouty on 06/02/2022 10:50:36 -------------------------------------------------------------------------------- Patient/Caregiver Education Details Patient Name: Date of Service: [name], [name] 9/12/2023andnbsp10:30 A M Medical Record Number: [id number] Patient Account Number: [id number] Date of Birth/Gender: Treating RN: May 30, 1936 (86 y.o. [name] Primary Care Physician: [name] Other Clinician: Referring Physician: Treating Physician/Extender: [name] in Treatment: 4 Education Assessment Education Provided To: Patient Education Topics Provided Venous: Methods: Explain/Verbal Responses: Reinforcements needed, State content correctly Electronic Signature(s) Signed: 06/02/2022 4:55:43 PM By: Baruch Gouty RN, BSN Entered By: Baruch Gouty on 06/02/2022 11:07:40 -------------------------------------------------------------------------------- Wound Assessment Details Patient Name: Date of Service: [name], [name] 06/02/2022 10:30 A M Medical Record Number: [id number] Patient Account Number: [id number] Date of Birth/Sex: Treating RN: 22-Nov-1935 (86 y.o. [name] Primary Care Fardowsa Authier: [name] Other Clinician: Referring Lennart Gladish: Treating Lawayne Hartig/Extender: [name] in Treatment: 4 Wound Status Wound Number: 2 Primary Venous Leg Ulcer Etiology: Wound Location: Left, Posterior Lower Leg Secondary Lymphedema Wounding Event: Gradually Appeared Etiology: Date Acquired: 03/30/2022 Wound Open Weeks Of Treatment: 4 Status: Clustered Wound: No Comorbid Glaucoma, Arrhythmia, Coronary Artery Disease,  Hypertension, History: Peripheral Venous Disease, Osteoarthritis Photos Wound Measurements Length: (cm) 0.8 Width: (cm) 0.4 Depth: (cm) 0.1 Area: (cm) 0.251 Volume: (cm) 0.025 % Reduction in Area: 97.5% % Reduction in Volume: 97.5% Epithelialization: Medium (34-66%) Tunneling: No Undermining: No Wound Description Classification: Full Thickness Without Exposed Support Structures Wound Margin: Flat and Intact Exudate Amount: Small Exudate Type: Serosanguineous Exudate Color: red, brown Foul Odor After Cleansing: No Slough/Fibrino Yes Wound Bed Granulation Amount: Medium (34-66%) Exposed Structure Granulation Quality: Pink Fascia Exposed: No Necrotic Amount: Medium (34-66%) Fat Layer (Subcutaneous Tissue) Exposed: Yes Necrotic Quality: Adherent Slough Tendon Exposed: No Muscle Exposed: No Joint Exposed: No Bone Exposed: No Treatment Notes Wound #2 (Lower Leg) Wound Laterality: Left, Posterior Cleanser Peri-Wound Care Sween Lotion (Moisturizing lotion) Discharge Instruction: Apply moisturizing lotion as directed Topical Primary Dressing KerraCel Ag Gelling Fiber Dressing, 2x2 in (silver alginate) Discharge Instruction: Apply silver alginate to wound bed as instructed Secondary Dressing Woven Gauze Sponge, Non-Sterile 4x4 in Discharge Instruction: Apply over primary dressing as directed. Secured With Compression Wrap ThreePress (3 layer compression wrap) Discharge Instruction: Apply three layer compression as directed. Compression Stockings Add-Ons Electronic Signature(s) Signed: 06/02/2022 4:55:43 PM By: Baruch Gouty RN, BSN Entered By: Baruch Gouty on 06/02/2022 11:04:34 -------------------------------------------------------------------------------- Vitals Details Patient Name: Date of Service: [name], [name] 06/02/2022 10:30 A M Medical Record Number: [id number] Patient Account Number: [id number] Date of Birth/Sex: Treating RN: [birth year] (86 y.o.  [name] Primary Care Kalianna Verbeke: [name] Other Clinician: Referring Meganne Rita: Treating Deiondre Harrower/Extender: [name] in Treatment: 4 Vital Signs Time Taken: 10:49 Temperature (F): 98.3 Height (in): 65 Pulse (bpm): 68 Weight (lbs): 195  Respiratory Rate (breaths/min): 20 Body Mass Index (BMI): 32.4 Blood Pressure (mmHg): 125/72 Reference Range: 80 - 120 mg / dl Electronic Signature(s) Signed: 06/02/2022 4:55:43 PM By: Baruch Gouty RN, BSN Entered By: Baruch Gouty on 06/02/2022 10:50:29

## 2022-06-03 DIAGNOSIS — C4442 Squamous cell carcinoma of skin of scalp and neck: Secondary | ICD-10-CM | POA: Diagnosis not present

## 2022-06-09 ENCOUNTER — Encounter (HOSPITAL_BASED_OUTPATIENT_CLINIC_OR_DEPARTMENT_OTHER): Payer: Medicare Other | Admitting: General Surgery

## 2022-06-09 DIAGNOSIS — I890 Lymphedema, not elsewhere classified: Secondary | ICD-10-CM | POA: Diagnosis not present

## 2022-06-09 DIAGNOSIS — I10 Essential (primary) hypertension: Secondary | ICD-10-CM | POA: Diagnosis not present

## 2022-06-09 DIAGNOSIS — I872 Venous insufficiency (chronic) (peripheral): Secondary | ICD-10-CM | POA: Diagnosis not present

## 2022-06-09 DIAGNOSIS — L97822 Non-pressure chronic ulcer of other part of left lower leg with fat layer exposed: Secondary | ICD-10-CM | POA: Diagnosis not present

## 2022-06-09 DIAGNOSIS — S81801A Unspecified open wound, right lower leg, initial encounter: Secondary | ICD-10-CM | POA: Diagnosis not present

## 2022-06-09 DIAGNOSIS — I480 Paroxysmal atrial fibrillation: Secondary | ICD-10-CM | POA: Diagnosis not present

## 2022-06-09 DIAGNOSIS — Z7901 Long term (current) use of anticoagulants: Secondary | ICD-10-CM | POA: Diagnosis not present

## 2022-06-09 DIAGNOSIS — L97222 Non-pressure chronic ulcer of left calf with fat layer exposed: Secondary | ICD-10-CM | POA: Diagnosis not present

## 2022-06-09 DIAGNOSIS — I2510 Atherosclerotic heart disease of native coronary artery without angina pectoris: Secondary | ICD-10-CM | POA: Diagnosis not present

## 2022-06-10 NOTE — Progress Notes (Signed)
[name] ([id number]) Visit Report for 06/09/2022 Chief Complaint Document Details Patient Name: Date of Service: [name], [name] 06/09/2022 10:30 A M Medical Record Number: [id number] Patient Account Number: [id number] Date of Birth/Sex: Treating RN: 06-04-1936 (86 y.o. M) Primary Care Provider: Kathlene [name] Other Clinician: Referring Provider: Treating Provider/Extender: [name] in Treatment: 5 Information Obtained from: Patient Chief Complaint Left lower extremity wound Electronic Signature(s) Signed: 06/09/2022 11:03:33 AM By: [name] Maudlin MD FACS Entered By: [name] on 06/09/2022 11:03:33 -------------------------------------------------------------------------------- Debridement Details Patient Name: Date of Service: [name], [name] 06/09/2022 10:30 A M Medical Record Number: [id number] Patient Account Number: [id number] Date of Birth/Sex: Treating RN: 11-02-1935 (87 y.o. [name] Primary Care Provider: Kathlene [name] Other Clinician: Referring Provider: Treating Provider/Extender: [name] in Treatment: 5 Debridement Performed for Assessment: Wound #2 Left,Posterior Lower Leg Performed By: Physician [name] Maudlin, MD Debridement Type: Debridement Severity of Tissue Pre Debridement: Fat layer exposed Level of Consciousness (Pre-procedure): Awake and Alert Pre-procedure Verification/Time Out Yes - 10:54 Taken: Start Time: 10:55 T Area Debrided (L x W): otal 0.7 (cm) x 0.5 (cm) = 0.35 (cm) Tissue and other material debrided: Eschar, Slough, Slough Level: Non-Viable Tissue Debridement Description: Selective/Open Wound Instrument: Curette Bleeding: Minimum Hemostasis Achieved: Pressure Procedural Pain: 0 Post Procedural Pain: 0 Response to Treatment: Procedure was tolerated well Level of Consciousness (Post- Awake and Alert procedure): Post Debridement Measurements of Total Wound Length: (cm)  0.7 Width: (cm) 0.5 Depth: (cm) 0.1 Volume: (cm) 0.027 Character of Wound/Ulcer Post Debridement: Improved Severity of Tissue Post Debridement: Fat layer exposed Post Procedure Diagnosis Same as Pre-procedure Notes Scribed for Dr. Celine [name] by [name] East, RN Electronic Signature(s) Signed: 06/09/2022 11:08:41 AM By: [name] Maudlin MD FACS Signed: 06/10/2022 5:15:08 PM By: [name] East RN Entered By: [name] on 06/09/2022 10:56:47 -------------------------------------------------------------------------------- HPI Details Patient Name: Date of Service: [name], [name] 06/09/2022 10:30 A M Medical Record Number: [id number] Patient Account Number: [id number] Date of Birth/Sex: Treating RN: Apr 28, 1936 (86 y.o. M) Primary Care Provider: Kathlene [name] Other Clinician: Referring Provider: Treating Provider/Extender: [name] in Treatment: 5 History of Present Illness HPI Description: [name] is an 86 year old male with a past medical history of CAD s/p DES to LCx 2006, carotid artery disease, hypertension, hyperlipidemia, paroxysmal atrial fibrillation on Eliquis and Cardizem that presents today for right lower extremity wound. He was seen by Dr. Larose [name], his primary care provider on 4/29 for this issue and was referred to our clinic. Patient states that he had a small wound that spontaneously started in October 2021 and has not healed. He states this has progressively gotten larger. He has been using acne cream prescribed by the dermatologist for this issue. He reports drainage to the wound but no purulent drainage. He reports mild soreness to the wound. He has swelling in his right leg greater than left and reports this is a chronic issue for the past 1 to 2 years. He denies resting leg pain or pain with ambulation. Of note He was prescribed doxycycline at the end of last month and has finished this course for possible skin infection to the wound site. 02/06/2021;  I am seeing this patient who was admitted to the clinic last week by Dr. Heber [name] He has predominantly I think a venous insufficiency wound on the right medial lower leg he has been using Santyl Hydrofera Blue under 3 layer compression. Arterial studies were ordered  last week but do not seem to been put through. He is not a diabetic. He did have venous reflux studies done in August 2020. He did have abnormal reflux time was noted in the great saphenous vein in the distal thigh and the great saphenous vein at the knee. There was no evidence of DVT or SVT at the time he was not felt to have large enough saphenous veins on either side for intervention. Compression stockings at least knee-high were recommended. Follow-up was on a as needed basis with Dr. Donzetta [name] The patient does not describe claudication but his pulses in his feet are not vibrant this could be because of the swelling 5/26; patient presents for 1 week follow-up. He has been using Hydrofera Blue under 3 layer compression. He had arterial studies done. He has no issues or complaints today. He denies signs of infection. He has his juxta light compressions with him today. 6/3; patient presents for 1 week follow-up. He has been using Hydrofera Blue under 3 layer compression. He denies signs and symptoms of infection. He did have bright green drainage which he states he has not seen before when the dressing was taken off today. He is using his juxta light compression to the left leg. 6/10; patient presents for 1 week follow-up. He has been tolerating Hydrofera Blue with 3 layer compression. He again reports bright green drainage. However he denies any signs of infection. He has been using juxta light compression to the left leg. 6/24; patient presents for 2-week follow-up. He has been tolerating Hydrofera Blue with 3 layer compression. Today he is healed. He brought his juxta lite compression for the right leg and continues to wear the juxta light  compression on the left leg READMISSION 05/05/2022 The patient returns to clinic today with a new wound on his left posterior leg. He has been wearing his juxta lite stockings on a regular basis, but on exam he still has 3+ pitting edema. I suspect he has lymphedema in addition to his known venous reflux. The wounds are scattered geographic wounds with light slough accumulation. They have been applying mupirocin and CeraVe ointment. He does have a VP shunt placement scheduled for next week, but it sounds like his neurosurgeon is not aware of the fact that he has an open wound. 05/11/2022: His VP shunt placement has been postponed while we get his wound to heal. The wounds are all smaller today with just a little eschar and minimal slough. Edema control is good. No concern for infection. 05/18/2022: All of the open areas have contracted and there is good perimeter epithelialization. There is a little bit of eschar and slough on the open areas. Good edema control. No concern for infection. 05/26/2022: Many of the small open areas have closed. There is Hydrofera Blue sponge stuck in a number of places and this is unable to be removed by the intake nurse. There is a fair amount of eschar and a little bit of slough present. 06/02/2022: Continued closure of small open skin sites. There are just a couple remaining and these have a little eschar on the surface. Edema control is excellent. 06/09/2022: We are down to just 1 small open area. There is some eschar and slough present. No concern for infection. Edema control is excellent. Electronic Signature(s) Signed: 06/09/2022 11:05:53 AM By: [name] Maudlin MD FACS Entered By: [name] on 06/09/2022 11:05:53 -------------------------------------------------------------------------------- Physical Exam Details Patient Name: Date of Service: [name], [name] 06/09/2022 10:30 A M Medical  Record Number: [id number] Patient Account Number: [id number] Date  of Birth/Sex: Treating RN: [birth year] (86 y.o. M) Primary Care Provider: Kathlene [name] Other Clinician: Referring Provider: Treating Provider/Extender: [name] in Treatment: 5 Constitutional Hypertensive, asymptomatic. . . . No acute distress.Marland Kitchen Respiratory Normal work of breathing on room air.. Notes 06/09/2022: We are down to just 1 small open area. There is some eschar and slough present. No concern for infection. Edema control is excellent. Electronic Signature(s) Signed: 06/09/2022 11:06:17 AM By: [name] Maudlin MD FACS Entered By: [name] on 06/09/2022 11:06:17 -------------------------------------------------------------------------------- Physician Orders Details Patient Name: Date of Service: [name], [name] 06/09/2022 10:30 A M Medical Record Number: [id number] Patient Account Number: [id number] Date of Birth/Sex: Treating RN: 04/11/1936 (87 y.o. [name] Primary Care Provider: Kathlene [name] Other Clinician: Referring Provider: Treating Provider/Extender: [name] in Treatment: 5 Verbal / Phone Orders: No Diagnosis Coding ICD-10 Coding Code Description I87.2 Venous insufficiency (chronic) (peripheral) I48.91 Unspecified atrial fibrillation I10 Essential (primary) hypertension I25.10 Atherosclerotic heart disease of native coronary artery without angina pectoris L97.822 Non-pressure chronic ulcer of other part of left lower leg with fat layer exposed Follow-up Appointments ppointment in 1 week. - Dr. Celine [name] RM 1 with Vaughan Basta Return A Anesthetic (In clinic) Topical Lidocaine 4% applied to wound bed Bathing/ Shower/ Hygiene May shower with protection but do not get wound dressing(s) wet. - can purchase cast protector at [facility] or [facility] Edema Control - Lymphedema / SCD / Other Elevate legs to the level of the heart or above for 30 minutes daily and/or when sitting, a frequency of: - throughout the  day Avoid standing for long periods of time. Exercise regularly - including ankle circles while sitting Compression stocking or Garment 20-30 mm/Hg pressure to: - right leg daily Wound Treatment Wound #2 - Lower Leg Wound Laterality: Left, Posterior Peri-Wound Care: Sween Lotion (Moisturizing lotion) 1 x Per Week/30 Days Discharge Instructions: Apply moisturizing lotion as directed Prim Dressing: KerraCel Ag Gelling Fiber Dressing, 2x2 in (silver alginate) 1 x Per Week/30 Days ary Discharge Instructions: Apply silver alginate to wound bed as instructed Secondary Dressing: Woven Gauze Sponge, Non-Sterile 4x4 in 1 x Per Week/30 Days Discharge Instructions: Apply over primary dressing as directed. Compression Wrap: ThreePress (3 layer compression wrap) 1 x Per Week/30 Days Discharge Instructions: Apply three layer compression as directed. Electronic Signature(s) Signed: 06/09/2022 11:08:41 AM By: [name] Maudlin MD FACS Entered By: [name] on 06/09/2022 11:06:35 -------------------------------------------------------------------------------- Problem List Details Patient Name: Date of Service: [name], [name] 06/09/2022 10:30 A M Medical Record Number: [id number] Patient Account Number: [id number] Date of Birth/Sex: Treating RN: 1936-05-05 (86 y.o. M) Primary Care Provider: Kathlene [name] Other Clinician: Referring Provider: Treating Provider/Extender: [name] in Treatment: 5 Active Problems ICD-10 Encounter Code Description Active Date MDM Diagnosis I87.2 Venous insufficiency (chronic) (peripheral) 05/05/2022 No Yes I48.91 Unspecified atrial fibrillation 05/05/2022 No Yes I10 Essential (primary) hypertension 05/05/2022 No Yes I25.10 Atherosclerotic heart disease of native coronary artery without angina pectoris 05/05/2022 No Yes L97.822 Non-pressure chronic ulcer of other part of left lower leg with fat layer exposed8/15/2023 No Yes Inactive  Problems Resolved Problems Electronic Signature(s) Signed: 06/09/2022 11:03:19 AM By: [name] Maudlin MD FACS Entered By: [name] on 06/09/2022 11:03:18 -------------------------------------------------------------------------------- Progress Note Details Patient Name: Date of Service: [name], [name] 06/09/2022 10:30 A M Medical Record Number: [id number] Patient Account Number: [id number] Date of Birth/Sex: Treating RN: June 11, 1936 (86 y.o.  M) Primary Care Provider: Kathlene [name] Other Clinician: Referring Provider: Treating Provider/Extender: [name] in Treatment: 5 Subjective Chief Complaint Information obtained from Patient Left lower extremity wound History of Present Illness (HPI) [name] is an 86 year old male with a past medical history of CAD s/p DES to LCx 2006, carotid artery disease, hypertension, hyperlipidemia, paroxysmal atrial fibrillation on Eliquis and Cardizem that presents today for right lower extremity wound. He was seen by Dr. Larose [name], his primary care provider on 4/29 for this issue and was referred to our clinic. Patient states that he had a small wound that spontaneously started in October 2021 and has not healed. He states this has progressively gotten larger. He has been using acne cream prescribed by the dermatologist for this issue. He reports drainage to the wound but no purulent drainage. He reports mild soreness to the wound. He has swelling in his right leg greater than left and reports this is a chronic issue for the past 1 to 2 years. He denies resting leg pain or pain with ambulation. Of note He was prescribed doxycycline at the end of last month and has finished this course for possible skin infection to the wound site. 02/06/2021; I am seeing this patient who was admitted to the clinic last week by Dr. Heber [place]. He has predominantly I think a venous insufficiency wound on the right medial lower leg he has been using Santyl  Hydrofera Blue under 3 layer compression. Arterial studies were ordered last week but do not seem to been put through. He is not a diabetic. He did have venous reflux studies done in August 2020. He did have abnormal reflux time was noted in the great saphenous vein in the distal thigh and the great saphenous vein at the knee. There was no evidence of DVT or SVT at the time he was not felt to have large enough saphenous veins on either side for intervention. Compression stockings at least knee-high were recommended. Follow-up was on a as needed basis with Dr. Donzetta [name] The patient does not describe claudication but his pulses in his feet are not vibrant this could be because of the swelling 5/26; patient presents for 1 week follow-up. He has been using Hydrofera Blue under 3 layer compression. He had arterial studies done. He has no issues or complaints today. He denies signs of infection. He has his juxta light compressions with him today. 6/3; patient presents for 1 week follow-up. He has been using Hydrofera Blue under 3 layer compression. He denies signs and symptoms of infection. He did have bright green drainage which he states he has not seen before when the dressing was taken off today. He is using his juxta light compression to the left leg. 6/10; patient presents for 1 week follow-up. He has been tolerating Hydrofera Blue with 3 layer compression. He again reports bright green drainage. However he denies any signs of infection. He has been using juxta light compression to the left leg. 6/24; patient presents for 2-week follow-up. He has been tolerating Hydrofera Blue with 3 layer compression. Today he is healed. He brought his juxta lite compression for the right leg and continues to wear the juxta light compression on the left leg READMISSION 05/05/2022 The patient returns to clinic today with a new wound on his left posterior leg. He has been wearing his juxta lite stockings on a regular  basis, but on exam he still has 3+ pitting edema. I suspect he has lymphedema in addition to  his known venous reflux. The wounds are scattered geographic wounds with light slough accumulation. They have been applying mupirocin and CeraVe ointment. He does have a VP shunt placement scheduled for next week, but it sounds like his neurosurgeon is not aware of the fact that he has an open wound. 05/11/2022: His VP shunt placement has been postponed while we get his wound to heal. The wounds are all smaller today with just a little eschar and minimal slough. Edema control is good. No concern for infection. 05/18/2022: All of the open areas have contracted and there is good perimeter epithelialization. There is a little bit of eschar and slough on the open areas. Good edema control. No concern for infection. 05/26/2022: Many of the small open areas have closed. There is Hydrofera Blue sponge stuck in a number of places and this is unable to be removed by the intake nurse. There is a fair amount of eschar and a little bit of slough present. 06/02/2022: Continued closure of small open skin sites. There are just a couple remaining and these have a little eschar on the surface. Edema control is excellent. 06/09/2022: We are down to just 1 small open area. There is some eschar and slough present. No concern for infection. Edema control is excellent. Patient History Information obtained from Patient. Family History Cancer - Father, Diabetes - Maternal Grandparents, Heart Disease - Maternal Grandparents, Hypertension - Maternal Grandparents, No family history of Hereditary Spherocytosis, Kidney Disease, Lung Disease, Seizures, Stroke, Thyroid Problems, Tuberculosis. Social History Former smoker - Quit over 30 years ago, Marital Status - Married, Alcohol Use - Rarely, Drug Use - No History, Caffeine Use - Daily. Medical History Eyes Patient has history of Glaucoma Cardiovascular Patient has history of Arrhythmia  - afib, Coronary Artery Disease, Hypertension, Peripheral Venous Disease Endocrine Denies history of Type I Diabetes, Type II Diabetes Genitourinary Denies history of End Stage Renal Disease Integumentary (Skin) Denies history of History of Burn Musculoskeletal Patient has history of Osteoarthritis Oncologic Denies history of Received Chemotherapy, Received Radiation Psychiatric Denies history of Anorexia/bulimia, Confinement Anxiety Hospitalization/Surgery History - lumbar laminectomy. - total knee arthropathy right. - bil total hip arthroplasty. - coronary stents. - toe surgery. - tonsillectomy. Medical A Surgical History Notes nd Gastrointestinal colon polyps Endocrine Hypothyroidism Genitourinary BPH Musculoskeletal Bilat Hip Replacements, Right Knee Replacement, lumbar stenosis Neurologic hydrocephalus Oncologic Skin Cancer multiple sites Objective Constitutional Hypertensive, asymptomatic. No acute distress.. Vitals Time Taken: 10:42 AM, Height: 65 in, Weight: 195 lbs, BMI: 32.4, Temperature: 98.0 F, Pulse: 76 bpm, Respiratory Rate: 18 breaths/min, Blood Pressure: 162/75 mmHg. Respiratory Normal work of breathing on room air.. General Notes: 06/09/2022: We are down to just 1 small open area. There is some eschar and slough present. No concern for infection. Edema control is excellent. Integumentary (Hair, Skin) Wound #2 status is Open. Original cause of wound was Gradually Appeared. The date acquired was: 03/30/2022. The wound has been in treatment 5 weeks. The wound is located on the Left,Posterior Lower Leg. The wound measures 0.7cm length x 0.5cm width x 0.1cm depth; 0.275cm^2 area and 0.027cm^3 volume. There is Fat Layer (Subcutaneous Tissue) exposed. There is no tunneling or undermining noted. There is a small amount of serosanguineous drainage noted. The wound margin is flat and intact. There is large (67-100%) pink granulation within the wound bed. There is a  small (1-33%) amount of necrotic tissue within the wound bed including Eschar and Adherent Slough. Assessment Active Problems ICD-10 Venous insufficiency (chronic) (peripheral) Unspecified atrial fibrillation  Essential (primary) hypertension Atherosclerotic heart disease of native coronary artery without angina pectoris Non-pressure chronic ulcer of other part of left lower leg with fat layer exposed Procedures Wound #2 Pre-procedure diagnosis of Wound #2 is a Venous Leg Ulcer located on the Left,Posterior Lower Leg .Severity of Tissue Pre Debridement is: Fat layer exposed. There was a Selective/Open Wound Non-Viable Tissue Debridement with a total area of 0.35 sq cm performed by [name] Maudlin, MD. With the following instrument(s): Curette Material removed includes Eschar and Slough and. No specimens were taken. A time out was conducted at 10:54, prior to the start of the procedure. A Minimum amount of bleeding was controlled with Pressure. The procedure was tolerated well with a pain level of 0 throughout and a pain level of 0 following the procedure. Post Debridement Measurements: 0.7cm length x 0.5cm width x 0.1cm depth; 0.027cm^3 volume. Character of Wound/Ulcer Post Debridement is improved. Severity of Tissue Post Debridement is: Fat layer exposed. Post procedure Diagnosis Wound #2: Same as Pre-Procedure General Notes: Scribed for Dr. Celine [name] by [name] East, RN. Plan Follow-up Appointments: Return Appointment in 1 week. - Dr. Celine [name] RM 1 with Vaughan Basta Anesthetic: (In clinic) Topical Lidocaine 4% applied to wound bed Bathing/ Shower/ Hygiene: May shower with protection but do not get wound dressing(s) wet. - can purchase cast protector at [facility] or [facility] Edema Control - Lymphedema / SCD / Other: Elevate legs to the level of the heart or above for 30 minutes daily and/or when sitting, a frequency of: - throughout the day Avoid standing for long periods of time. Exercise regularly -  including ankle circles while sitting Compression stocking or Garment 20-30 mm/Hg pressure to: - right leg daily WOUND #2: - Lower Leg Wound Laterality: Left, Posterior Peri-Wound Care: Sween Lotion (Moisturizing lotion) 1 x Per Week/30 Days Discharge Instructions: Apply moisturizing lotion as directed Prim Dressing: KerraCel Ag Gelling Fiber Dressing, 2x2 in (silver alginate) 1 x Per Week/30 Days ary Discharge Instructions: Apply silver alginate to wound bed as instructed Secondary Dressing: Woven Gauze Sponge, Non-Sterile 4x4 in 1 x Per Week/30 Days Discharge Instructions: Apply over primary dressing as directed. Com pression Wrap: ThreePress (3 layer compression wrap) 1 x Per Week/30 Days Discharge Instructions: Apply three layer compression as directed. 06/09/2022: We are down to just 1 small open area. There is some eschar and slough present. No concern for infection. Edema control is excellent. I used a curette to debride the slough and eschar from the wound. We will continue silver alginate and 3 layer compression. I am hopeful that this wound will be closed at our next visit. Follow-up in 1 week. Electronic Signature(s) Signed: 06/09/2022 11:08:05 AM By: [name] Maudlin MD FACS Entered By: [name] on 06/09/2022 11:08:04 -------------------------------------------------------------------------------- HxROS Details Patient Name: Date of Service: [name], [name] 06/09/2022 10:30 A M Medical Record Number: [id number] Patient Account Number: [id number] Date of Birth/Sex: Treating RN: May 18, 1936 (86 y.o. M) Primary Care Provider: Kathlene [name] Other Clinician: Referring Provider: Treating Provider/Extender: [name] in Treatment: 5 Information Obtained From Patient Eyes Medical History: Positive for: Glaucoma Cardiovascular Medical History: Positive for: Arrhythmia - afib; Coronary Artery Disease; Hypertension; Peripheral Venous  Disease Gastrointestinal Medical History: Past Medical History Notes: colon polyps Endocrine Medical History: Negative for: Type I Diabetes; Type II Diabetes Past Medical History Notes: Hypothyroidism Genitourinary Medical History: Negative for: End Stage Renal Disease Past Medical History Notes: BPH Integumentary (Skin) Medical History: Negative for: History of Burn Musculoskeletal Medical History: Positive  for: Osteoarthritis Past Medical History Notes: Bilat Hip Replacements, Right Knee Replacement, lumbar stenosis Neurologic Medical History: Past Medical History Notes: hydrocephalus Oncologic Medical History: Negative for: Received Chemotherapy; Received Radiation Past Medical History Notes: Skin Cancer multiple sites Psychiatric Medical History: Negative for: Anorexia/bulimia; Confinement Anxiety HBO Extended History Items Eyes: Glaucoma Immunizations Pneumococcal Vaccine: Received Pneumococcal Vaccination: Yes Received Pneumococcal Vaccination On or After 60th Birthday: Yes Implantable Devices None Hospitalization / Surgery History Type of Hospitalization/Surgery lumbar laminectomy total knee arthropathy right bil total hip arthroplasty coronary stents toe surgery tonsillectomy Family and Social History Cancer: Yes - Father; Diabetes: Yes - Maternal Grandparents; Heart Disease: Yes - Maternal Grandparents; Hereditary Spherocytosis: No; Hypertension: Yes - Maternal Grandparents; Kidney Disease: No; Lung Disease: No; Seizures: No; Stroke: No; Thyroid Problems: No; Tuberculosis: No; Former smoker - Quit over 30 years ago; Marital Status - Married; Alcohol Use: Rarely; Drug Use: No History; Caffeine Use: Daily; Financial Concerns: No; Food, Clothing or Shelter Needs: No; Support System Lacking: No; Transportation Concerns: No Electronic Signature(s) Signed: 06/09/2022 11:08:41 AM By: [name] Maudlin MD FACS Entered By: [name] on 06/09/2022  11:05:58 -------------------------------------------------------------------------------- SuperBill Details Patient Name: Date of Service: [name], [name] 06/09/2022 Medical Record Number: [id number] Patient Account Number: [id number] Date of Birth/Sex: Treating RN: 1936/03/27 (86 y.o. 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 Facility Procedures CPT4 Code: [id number] Description: [phone] - DEBRIDE WOUND 1ST 20 SQ CM OR < ICD-10 Diagnosis Description L97.822 Non-pressure chronic ulcer of other part of left lower leg with fat layer expose Modifier: d Quantity: 1 Physician Procedures : CPT4 Code Description Modifier [id number] - WC PHYS LEVEL 3 - EST PT 25 ICD-10 Diagnosis Description L97.822 Non-pressure chronic ulcer of other part of left lower leg with fat layer exposed I87.2 Venous insufficiency (chronic) (peripheral) I10  Essential (primary) hypertension I25.10 Atherosclerotic heart disease of native coronary artery without angina pectoris Quantity: 1 : [id number] - WC PHYS DEBR WO ANESTH 20 SQ CM ICD-10 Diagnosis Description L97.822 Non-pressure chronic ulcer of other part of left lower leg with fat layer exposed Quantity: 1 Electronic Signature(s) Signed: 06/09/2022 11:08:25 AM By: [name] Maudlin MD FACS Entered By: [name] on 06/09/2022 11:08:25

## 2022-06-10 NOTE — Progress Notes (Signed)
ROYALE, SWAMY ([id number]) Visit Report for 06/09/2022 Arrival Information Details Patient Name: Date of Service: [name], [place] RD E. 06/09/2022 10:30 A M Medical Record Number: [id number] Patient Account Number: [id number] Date of Birth/Sex: Treating RN: 24-May-1936 (86 y.o. [name] Primary Care Vennie Salsbury: [name] Other Clinician: Referring Kierrah Kilbride: Treating Annagrace Carr/Extender: [name] in Treatment: 5 Visit Information History Since Last Visit All ordered tests and consults were completed: Yes Patient Arrived: [name] Added or deleted any medications: No Arrival Time: 10:39 Any new allergies or adverse reactions: No Accompanied By: wife Had a fall or experienced change in No Transfer Assistance: None activities of daily living that may affect Patient Requires Transmission-Based Precautions: No risk of falls: Patient Has Alerts: Yes Signs or symptoms of abuse/neglect since last visito No Patient Alerts: R ABI = 1.09, TBI= .77 Hospitalized since last visit: No L ABI = 1.02, TBI = .83 Has Dressing in Place as Prescribed: Yes Has Compression in Place as Prescribed: Yes Pain Present Now: No Electronic Signature(s) Signed: 06/10/2022 5:15:08 PM By: Blanche East RN Entered By: Blanche East on 06/09/2022 10:40:31 -------------------------------------------------------------------------------- Compression Therapy Details Patient Name: Date of Service: [name], [name] 06/09/2022 10:30 A M Medical Record Number: [id number] Patient Account Number: [id number] Date of Birth/Sex: Treating RN: 11/21/1935 (86 y.o. [name] Primary Care Abrea Henle: [name] Other Clinician: Referring Rayven Rettig: Treating Lateef Juncaj/Extender: [name] in Treatment: 5 Compression Therapy Performed for Wound Assessment: Wound #2 Left,Posterior Lower Leg Performed By: Clinician Blanche East, RN Compression Type: Three Layer Post Procedure  Diagnosis Same as Pre-procedure Electronic Signature(s) Signed: 06/10/2022 5:15:08 PM By: Blanche East RN Entered By: Blanche East on 06/09/2022 11:09:46 -------------------------------------------------------------------------------- Encounter Discharge Information Details Patient Name: Date of Service: [name], [name] 06/09/2022 10:30 A M Medical Record Number: [id number] Patient Account Number: [id number] Date of Birth/Sex: Treating RN: [birth year] (86 y.o. [name] Primary Care Tiye Huwe: [name] Other Clinician: Referring Teagan Heidrick: Treating Sofia Jaquith/Extender: [name] in Treatment: 5 Encounter Discharge Information Items Post Procedure Vitals Discharge Condition: Stable Temperature (F): 98.0 Ambulatory Status: Cane Pulse (bpm): 76 Discharge Destination: Home Respiratory Rate (breaths/min): 18 Transportation: Private Auto Blood Pressure (mmHg): 162/75 Accompanied By: wife Schedule Follow-up Appointment: Yes Clinical Summary of Care: Electronic Signature(s) Signed: 06/10/2022 5:15:08 PM By: Blanche East RN Entered By: Blanche East on 06/09/2022 10:57:57 -------------------------------------------------------------------------------- Lower Extremity Assessment Details Patient Name: Date of Service: [name], [name] 06/09/2022 10:30 A M Medical Record Number: [id number] Patient Account Number: [id number] Date of Birth/Sex: Treating RN: 1935-11-25 (86 y.o. [name] Primary Care Meghann Landing: [name] Other Clinician: Referring Misha Antonini: Treating Latika Kronick/Extender: [name] in Treatment: 5 Edema Assessment Assessed: [Left: No] [Right: No] Edema: [Left: Ye] [Right: s] Calf Left: Right: Point of Measurement: 30 cm From Medial Instep 35.5 cm Ankle Left: Right: Point of Measurement: 13 cm From Medial Instep 22 cm Vascular Assessment Pulses: Dorsalis Pedis Palpable: [Left:Yes] Electronic  Signature(s) Signed: 06/10/2022 5:15:08 PM By: Blanche East RN Entered By: Blanche East on 06/09/2022 10:44:47 -------------------------------------------------------------------------------- Multi Wound Chart Details Patient Name: Date of Service: [name], [name] 06/09/2022 10:30 A M Medical Record Number: [id number] Patient Account Number: [id number] Date of Birth/Sex: Treating RN: [birth year] (86 y.o. M) Primary Care Lakevia Perris: [name] Other Clinician: Referring Morry Veiga: Treating Orianna Biskup/Extender: [name] in Treatment: 5 Vital Signs Height(in): 65 Pulse(bpm): 76 Weight(lbs): 195 Blood Pressure(mmHg): 162/75 Body Mass  Index(BMI): 32.4 Temperature(F): 98.0 Respiratory Rate(breaths/min): 18 Photos: [N/A:N/A] Left, Posterior Lower Leg N/A N/A Wound Location: Gradually Appeared N/A N/A Wounding Event: Venous Leg Ulcer N/A N/A Primary Etiology: Lymphedema N/A N/A Secondary Etiology: Glaucoma, Arrhythmia, Coronary N/A N/A Comorbid History: Artery Disease, Hypertension, Peripheral Venous Disease, Osteoarthritis 03/30/2022 N/A N/A Date Acquired: 5 N/A N/A Weeks of Treatment: Open N/A N/A Wound Status: No N/A N/A Wound Recurrence: 0.7x0.5x0.1 N/A N/A Measurements L x W x D (cm) 0.275 N/A N/A A (cm) : rea 0.027 N/A N/A Volume (cm) : 97.30% N/A N/A % Reduction in A rea: 97.30% N/A N/A % Reduction in Volume: Full Thickness Without Exposed N/A N/A Classification: Support Structures Small N/A N/A Exudate A mount: Serosanguineous N/A N/A Exudate Type: red, brown N/A N/A Exudate Color: Flat and Intact N/A N/A Wound Margin: Large (67-100%) N/A N/A Granulation A mount: Pink N/A N/A Granulation Quality: Small (1-33%) N/A N/A Necrotic A mount: Eschar, Adherent Slough N/A N/A Necrotic Tissue: Fat Layer (Subcutaneous Tissue): Yes N/A N/A Exposed Structures: Fascia: No Tendon: No Muscle: No Joint: No Bone: No Medium  (34-66%) N/A N/A Epithelialization: Debridement - Selective/Open Wound N/A N/A Debridement: Pre-procedure Verification/Time Out 10:54 N/A N/A Taken: Necrotic/Eschar, Slough N/A N/A Tissue Debrided: Non-Viable Tissue N/A N/A Level: 0.35 N/A N/A Debridement A (sq cm): rea Curette N/A N/A Instrument: Minimum N/A N/A Bleeding: Pressure N/A N/A Hemostasis A chieved: 0 N/A N/A Procedural Pain: 0 N/A N/A Post Procedural Pain: Procedure was tolerated well N/A N/A Debridement Treatment Response: 0.7x0.5x0.1 N/A N/A Post Debridement Measurements L x W x D (cm) 0.027 N/A N/A Post Debridement Volume: (cm) Debridement N/A N/A Procedures Performed: Treatment Notes Wound #2 (Lower Leg) Wound Laterality: Left, Posterior Cleanser Peri-Wound Care Sween Lotion (Moisturizing lotion) Discharge Instruction: Apply moisturizing lotion as directed Topical Primary Dressing KerraCel Ag Gelling Fiber Dressing, 2x2 in (silver alginate) Discharge Instruction: Apply silver alginate to wound bed as instructed Secondary Dressing Woven Gauze Sponge, Non-Sterile 4x4 in Discharge Instruction: Apply over primary dressing as directed. Secured With Compression Wrap ThreePress (3 layer compression wrap) Discharge Instruction: Apply three layer compression as directed. Compression Stockings Add-Ons Electronic Signature(s) Signed: 06/09/2022 11:03:26 AM By: Fredirick Maudlin MD FACS Entered By: Fredirick Maudlin on 06/09/2022 11:03:26 -------------------------------------------------------------------------------- Multi-Disciplinary Care Plan Details Patient Name: Date of Service: [name], [name] 06/09/2022 10:30 A M Medical Record Number: [id number] Patient Account Number: [id number] Date of Birth/Sex: Treating RN: [birth year] (86 y.o. [name] Primary Care Fiona Coto: [name] Other Clinician: Referring Vivianna Piccini: Treating Tnya Ades/Extender: [name] in  Treatment: 5 Multidisciplinary Care Plan reviewed with physician Active Inactive Venous Leg Ulcer Nursing Diagnoses: Knowledge deficit related to disease process and management Goals: Patient will maintain optimal edema control Date Initiated: 06/02/2022 Target Resolution Date: 06/15/2022 Goal Status: Active Interventions: Assess peripheral edema status every visit. Compression as ordered Treatment Activities: Therapeutic compression applied : 06/02/2022 Notes: Wound/Skin Impairment Nursing Diagnoses: Impaired tissue integrity Goals: Patient/caregiver will verbalize understanding of skin care regimen Date Initiated: 06/02/2022 Target Resolution Date: 06/15/2022 Goal Status: Active Ulcer/skin breakdown will have a volume reduction of 30% by week 4 Date Initiated: 05/18/2022 Target Resolution Date: 06/15/2022 Goal Status: Active Interventions: Assess patient/caregiver ability to obtain necessary supplies Assess patient/caregiver ability to perform ulcer/skin care regimen upon admission and as needed Treatment Activities: Skin care regimen initiated : 05/18/2022 Notes: Electronic Signature(s) Signed: 06/10/2022 5:15:08 PM By: Blanche East RN Entered By: Blanche East on 06/09/2022 10:51:38 -------------------------------------------------------------------------------- Pain Assessment Details Patient Name: Date of  Service: [name], [name] 06/09/2022 10:30 A M Medical Record Number: [id number] Patient Account Number: [id number] Date of Birth/Sex: Treating RN: [birth year] (86 y.o. [name] Primary Care Shaeleigh Graw: [name] Other Clinician: Referring Ishan Sanroman: Treating Emaree Chiu/Extender: [name] in Treatment: 5 Active Problems Location of Pain Severity and Description of Pain Patient Has Paino No Site Locations Pain Management and Medication Current Pain Management: Electronic Signature(s) Signed: 06/10/2022 5:15:08 PM By: Blanche East  RN Entered By: Blanche East on 06/09/2022 10:42:49 -------------------------------------------------------------------------------- Patient/Caregiver Education Details Patient Name: Date of Service: [name], [name] 9/19/2023andnbsp10:30 [place] Record Number: [id number] Patient Account Number: [id number] Date of Birth/Gender: Treating RN: 1936/09/04 (86 y.o. [name] Primary Care Physician: [name] Other Clinician: Referring Physician: Treating Physician/Extender: [name] in Treatment: 5 Education Assessment Education Provided To: Patient Education Topics Provided Wound/Skin Impairment: Methods: Explain/Verbal Responses: Reinforcements needed, State content correctly Electronic Signature(s) Signed: 06/10/2022 5:15:08 PM By: Blanche East RN Entered By: Blanche East on 06/09/2022 10:51:52 -------------------------------------------------------------------------------- Wound Assessment Details Patient Name: Date of Service: [name], [name] 06/09/2022 10:30 A M Medical Record Number: [id number] Patient Account Number: [id number] Date of Birth/Sex: Treating RN: [birth year] (86 y.o. [name] Primary Care Argus Caraher: [name] Other Clinician: Referring Jalene Demo: Treating Gottfried Standish/Extender: [name] in Treatment: 5 Wound Status Wound Number: 2 Primary Venous Leg Ulcer Etiology: Wound Location: Left, Posterior Lower Leg Secondary Lymphedema Wounding Event: Gradually Appeared Etiology: Date Acquired: 03/30/2022 Wound Open Weeks Of Treatment: 5 Status: Clustered Wound: No Comorbid Glaucoma, Arrhythmia, Coronary Artery Disease, Hypertension, History: Peripheral Venous Disease, Osteoarthritis Photos Wound Measurements Length: (cm) 0.7 Width: (cm) 0.5 Depth: (cm) 0.1 Area: (cm) 0.275 Volume: (cm) 0.027 % Reduction in Area: 97.3% % Reduction in Volume: 97.3% Epithelialization: Medium  (34-66%) Tunneling: No Undermining: No Wound Description Classification: Full Thickness Without Exposed Support Structures Wound Margin: Flat and Intact Exudate Amount: Small Exudate Type: Serosanguineous Exudate Color: red, brown Foul Odor After Cleansing: No Slough/Fibrino Yes Wound Bed Granulation Amount: Large (67-100%) Exposed Structure Granulation Quality: Pink Fascia Exposed: No Necrotic Amount: Small (1-33%) Fat Layer (Subcutaneous Tissue) Exposed: Yes Necrotic Quality: Eschar, Adherent Slough Tendon Exposed: No Muscle Exposed: No Joint Exposed: No Bone Exposed: No Treatment Notes Wound #2 (Lower Leg) Wound Laterality: Left, Posterior Cleanser Peri-Wound Care Sween Lotion (Moisturizing lotion) Discharge Instruction: Apply moisturizing lotion as directed Topical Primary Dressing KerraCel Ag Gelling Fiber Dressing, 2x2 in (silver alginate) Discharge Instruction: Apply silver alginate to wound bed as instructed Secondary Dressing Woven Gauze Sponge, Non-Sterile 4x4 in Discharge Instruction: Apply over primary dressing as directed. Secured With Compression Wrap ThreePress (3 layer compression wrap) Discharge Instruction: Apply three layer compression as directed. Compression Stockings Add-Ons Electronic Signature(s) Signed: 06/10/2022 5:15:08 PM By: Blanche East RN Entered By: Blanche East on 06/09/2022 10:50:01 -------------------------------------------------------------------------------- Vitals Details Patient Name: Date of Service: [name], [name] 06/09/2022 10:30 A M Medical Record Number: [id number] Patient Account Number: [id number] Date of Birth/Sex: Treating RN: 1936-08-06 (86 y.o. [name] Primary Care Avanell Banwart: [name] Other Clinician: Referring Kylene Zamarron: Treating Jakhai Fant/Extender: [name] in Treatment: 5 Vital Signs Time Taken: 10:42 Temperature (F): 98.0 Height (in): 65 Pulse (bpm): 76 Weight  (lbs): 195 Respiratory Rate (breaths/min): 18 Body Mass Index (BMI): 32.4 Blood Pressure (mmHg): 162/75 Reference Range: 80 - 120 mg / dl Electronic Signature(s) Signed: 06/10/2022 5:15:08 PM By: Blanche East RN Entered By: Blanche East on  06/09/2022 10:42:44 

## 2022-06-12 ENCOUNTER — Ambulatory Visit (INDEPENDENT_AMBULATORY_CARE_PROVIDER_SITE_OTHER): Payer: Medicare Other | Admitting: *Deleted

## 2022-06-12 DIAGNOSIS — Z0000 Encounter for general adult medical examination without abnormal findings: Secondary | ICD-10-CM

## 2022-06-12 NOTE — Patient Instructions (Signed)
[name] , Thank you for taking time to come for your Medicare Wellness Visit. I appreciate your ongoing commitment to your health goals. Please review the following plan we discussed and let me know if I can assist you in the future.   These are the goals we discussed:  Goals      Increase physical activity     Manage My Medicine     Timeframe:  Long-Range Goal Priority:  High Start Date: 11/15/20                            Expected End Date: 05/15/21                      Follow Up Date 02/18/21    - Take thyroid medication 30-60 minutes before food or other meds.   -Remember to report to PCP for updated labwork.    Why is this important?   These steps will help you keep on track with your medicines.   Notes:         This is a list of the screening recommended for you and due dates:  Health Maintenance  Topic Date Due   Flu Shot  12/20/2022*   Tetanus Vaccine  09/02/2023   Pneumonia Vaccine  Completed   HPV Vaccine  Aged Out   COVID-19 Vaccine  Discontinued   Zoster (Shingles) Vaccine  Discontinued  *Topic was postponed. The date shown is not the original due date.      Next appointment: Follow up in one year for your annual wellness visit.   Preventive Care 84 Years and Older, Male Preventive care refers to lifestyle choices and visits with your health care provider that can promote health and wellness. What does preventive care include? A yearly physical exam. This is also called an annual well check. Dental exams once or twice a year. Routine eye exams. Ask your health care provider how often you should have your eyes checked. Personal lifestyle choices, including: Daily care of your teeth and gums. Regular physical activity. Eating a healthy diet. Avoiding tobacco and drug use. Limiting alcohol use. Practicing safe sex. Taking low doses of aspirin every day. Taking vitamin and mineral supplements as recommended by your health care provider. What happens  during an annual well check? The services and screenings done by your health care provider during your annual well check will depend on your age, overall health, lifestyle risk factors, and family history of disease. Counseling  Your health care provider may ask you questions about your: Alcohol use. Tobacco use. Drug use. Emotional well-being. Home and relationship well-being. Sexual activity. Eating habits. History of falls. Memory and ability to understand (cognition). Work and work Statistician. Screening  You may have the following tests or measurements: Height, weight, and BMI. Blood pressure. Lipid and cholesterol levels. These may be checked every 5 years, or more frequently if you are over 46 years old. Skin check. Lung cancer screening. You may have this screening every year starting at age 50 if you have a 30-pack-year history of smoking and currently smoke or have quit within the past 15 years. Fecal occult blood test (FOBT) of the stool. You may have this test every year starting at age 60. Flexible sigmoidoscopy or colonoscopy. You may have a sigmoidoscopy every 5 years or a colonoscopy every 10 years starting at age 63. Prostate cancer screening. Recommendations will vary depending on your family history  and other risks. Hepatitis C blood test. Hepatitis B blood test. Sexually transmitted disease (STD) testing. Diabetes screening. This is done by checking your blood sugar (glucose) after you have not eaten for a while (fasting). You may have this done every 1-3 years. Abdominal aortic aneurysm (AAA) screening. You may need this if you are a current or former smoker. Osteoporosis. You may be screened starting at age 21 if you are at high risk. Talk with your health care provider about your test results, treatment options, and if necessary, the need for more tests. Vaccines  Your health care provider may recommend certain vaccines, such as: Influenza vaccine. This is  recommended every year. Tetanus, diphtheria, and acellular pertussis (Tdap, Td) vaccine. You may need a Td booster every 10 years. Zoster vaccine. You may need this after age 35. Pneumococcal 13-valent conjugate (PCV13) vaccine. One dose is recommended after age 64. Pneumococcal polysaccharide (PPSV23) vaccine. One dose is recommended after age 58. Talk to your health care provider about which screenings and vaccines you need and how often you need them. This information is not intended to replace advice given to you by your health care provider. Make sure you discuss any questions you have with your health care provider. Document Released: 10/04/2015 Document Revised: 05/27/2016 Document Reviewed: 07/09/2015 Elsevier Interactive Patient Education  2017 [place] Prevention in the Home Falls can cause injuries. They can happen to people of all ages. There are many things you can do to make your home safe and to help prevent falls. What can I do on the outside of my home? Regularly fix the edges of walkways and driveways and fix any cracks. Remove anything that might make you trip as you walk through a door, such as a raised step or threshold. Trim any bushes or trees on the path to your home. Use bright outdoor lighting. Clear any walking paths of anything that might make someone trip, such as rocks or tools. Regularly check to see if handrails are loose or broken. Make sure that both sides of any steps have handrails. Any raised decks and porches should have guardrails on the edges. Have any leaves, snow, or ice cleared regularly. Use sand or salt on walking paths during winter. Clean up any spills in your garage right away. This includes oil or grease spills. What can I do in the bathroom? Use night lights. Install grab bars by the toilet and in the tub and shower. Do not use towel bars as grab bars. Use non-skid mats or decals in the tub or shower. If you need to sit down in  the shower, use a plastic, non-slip stool. Keep the floor dry. Clean up any water that spills on the floor as soon as it happens. Remove soap buildup in the tub or shower regularly. Attach bath mats securely with double-sided non-slip rug tape. Do not have throw rugs and other things on the floor that can make you trip. What can I do in the bedroom? Use night lights. Make sure that you have a light by your bed that is easy to reach. Do not use any sheets or blankets that are too big for your bed. They should not hang down onto the floor. Have a firm chair that has side arms. You can use this for support while you get dressed. Do not have throw rugs and other things on the floor that can make you trip. What can I do in the kitchen? Clean up any spills  right away. Avoid walking on wet floors. Keep items that you use a lot in easy-to-reach places. If you need to reach something above you, use a strong step stool that has a grab bar. Keep electrical cords out of the way. Do not use floor polish or wax that makes floors slippery. If you must use wax, use non-skid floor wax. Do not have throw rugs and other things on the floor that can make you trip. What can I do with my stairs? Do not leave any items on the stairs. Make sure that there are handrails on both sides of the stairs and use them. Fix handrails that are broken or loose. Make sure that handrails are as long as the stairways. Check any carpeting to make sure that it is firmly attached to the stairs. Fix any carpet that is loose or worn. Avoid having throw rugs at the top or bottom of the stairs. If you do have throw rugs, attach them to the floor with carpet tape. Make sure that you have a light switch at the top of the stairs and the bottom of the stairs. If you do not have them, ask someone to add them for you. What else can I do to help prevent falls? Wear shoes that: Do not have high heels. Have rubber bottoms. Are comfortable  and fit you well. Are closed at the toe. Do not wear sandals. If you use a stepladder: Make sure that it is fully opened. Do not climb a closed stepladder. Make sure that both sides of the stepladder are locked into place. Ask someone to hold it for you, if possible. Clearly mark and make sure that you can see: Any grab bars or handrails. First and last steps. Where the edge of each step is. Use tools that help you move around (mobility aids) if they are needed. These include: Canes. Walkers. Scooters. Crutches. Turn on the lights when you go into a dark area. Replace any light bulbs as soon as they burn out. Set up your furniture so you have a clear path. Avoid moving your furniture around. If any of your floors are uneven, fix them. If there are any pets around you, be aware of where they are. Review your medicines with your doctor. Some medicines can make you feel dizzy. This can increase your chance of falling. Ask your doctor what other things that you can do to help prevent falls. This information is not intended to replace advice given to you by your health care provider. Make sure you discuss any questions you have with your health care provider. Document Released: 07/04/2009 Document Revised: 02/13/2016 Document Reviewed: 10/12/2014 Elsevier Interactive Patient Education  2017 [employer].

## 2022-06-12 NOTE — Progress Notes (Addendum)
Subjective:   [name] is a 86 y.o. male who presents for Medicare Annual/Subsequent preventive examination.  I connected with  [name] on 06/12/22 by a audio enabled telemedicine application and verified that I am speaking with the correct person using two identifiers.  Patient Location: Home  Provider Location: Office/Clinic  I discussed the limitations of evaluation and management by telemedicine. The patient expressed understanding and agreed to proceed.   Review of Systems    Defer to PCP Cardiac Risk Factors include: advanced age (>69mn, >>12women);dyslipidemia;hypertension;male gender     Objective:    There were no vitals filed for this visit. There is no height or weight on file to calculate BMI.     06/12/2022    1:04 PM 04/29/2021    3:36 PM 04/05/2020   10:14 AM 05/19/2019    1:52 PM 07/02/2016   10:25 AM 06/25/2014    1:45 PM 06/18/2014    9:25 AM  Advanced Directives  Does Patient Have a Medical Advance Directive? Yes Yes Yes Yes Yes Yes Yes  Type of AParamedicof [place]Old [place] will [place] will [place]Living will [place]Cor[place]Living will HMidway NorthLiving will Living will Living will  Does patient want to make changes to medical advance directive? No - Patient declined  No - Patient declined No - Patient declined No - Patient declined No - Patient declined No - Patient declined  Copy of HC[place]in Chart? No - copy requested No - copy requested No - copy requested  No - copy requested No - copy requested No - copy requested    Current Medications (verified) Outpatient Encounter Medications as of 06/12/2022  Medication Sig   apixaban (ELIQUIS) 5 MG TABS tablet TAKE 1 TABLET BY MOUTH TWICE DAILY   aspirin 81 MG tablet Take 1 tablet (81 mg total) by mouth daily. (Patient taking differently: Take 81 mg by mouth daily. bedtime)    atorvastatin (LIPITOR) 40 MG tablet TAKE 1 AND 1/2 TABLETS(60 MG) BY MOUTH AT BEDTIME   benazepril (LOTENSIN) 20 MG tablet TAKE 1 AND 1/2 TABLETS(30 MG) BY MOUTH DAILY   diltiazem (DILACOR XR) 180 MG 24 hr capsule TAKE 1 CAPSULE(180 MG) BY MOUTH DAILY   furosemide (LASIX) 40 MG tablet TAKE 1 AND 1/2 TABLETS(60 MG) BY MOUTH TWICE DAILY (Patient taking differently: Take 60 mg by mouth daily.)   levothyroxine (SYNTHROID) 175 MCG tablet Take 1 tablet (175 mcg total) by mouth daily.   LUMIGAN 0.01 % SOLN Place 1 drop into both eyes at bedtime.   metoprolol tartrate (LOPRESSOR) 25 MG tablet Take 0.5 tablets (12.5 mg total) by mouth 2 (two) times daily.   nitroGLYCERIN (NITROSTAT) 0.4 MG SL tablet PLACE 1 TABLET UNDER THE TONGUE EVERY 5 MINUTES AS NEEDED FOR CHEST PAIN (Patient not taking: Reported on 05/28/2022)   Timolol Maleate 0.5 % (DAILY) SOLN Apply 1 drop to eye in the morning and at bedtime. At breakfast and evening meals   No facility-administered encounter medications on file as of 06/12/2022.    Allergies (verified) Amlodipine besylate   History: Past Medical History:  Diagnosis Date   Arthritis    BPH (benign prostatic hypertrophy)    Carotid artery disease (HCC)    Doppler, December 08, 2011, 00 325%R. ICA, 405-39%LICA, followup 1 year   Coronary artery disease    DES Circumflex or CVA 2006  /  clear,  February, 2011, EF 70%, no ischemia or   Ejection fraction    EF 60%, echo, 2009, mildly calcified aortic leaflets   History of colonic polyps    Hyperlipidemia    Hypertension    Hypothyroidism    Lumbar radiculopathy    Multiple thyroid nodules    Avascular echogenic areas noted in the right thyroid at the time of carotid Doppler.    PONV (postoperative nausea and vomiting)    "nausea with first hip surgery"   Skin cancer (melanoma) ([place])    PMH of    Spinal stenosis, lumbar    Tremor    Fine tremor right upper extremity   Venous insufficiency    Past Surgical History:   Procedure Laterality Date   COLONOSCOPY     CORONARY ANGIOPLASTY WITH STENT PLACEMENT  2006   x 2 stents; DES to CX and dRCA '06   KNEE SURGERY  1970's   "chipped bone"   LUMBAR LAMINECTOMY/DECOMPRESSION MICRODISCECTOMY Left 07/13/2016   Procedure: LUMBAR LAMINECTOMY AND FORAMINOTOMY Lumbar two, three, Lumbar three-four, Lumbar four-five, LEFT Lumbar five-Sacral one DISECTOMY;  Surgeon: Newman Pies, MD;  Location: [facility];  Service: Neurosurgery;  Laterality: Left;   TOE SURGERY  1997   TONSILLECTOMY     TOTAL HIP ARTHROPLASTY Left 2007   TOTAL HIP ARTHROPLASTY Right 2010   TOTAL KNEE ARTHROPLASTY Right 06/25/2014   Procedure: RIGHT TOTAL KNEE ARTHROPLASTY;  Surgeon: Gearlean Alf, MD;  Location: [facility];  Service: Orthopedics;  Laterality: Right;   Family History  Problem Relation Age of Onset   Coronary artery disease Mother        carotid endarterectomy bilaterally   Colon cancer Father 10   Hypertension Father    Pancreatitis Father    Diabetes Sister    Diabetes Maternal Grandfather    Heart attack Maternal Grandfather 29   Heart attack Sister 58   Thyroid disease Neg Hx    Stroke Neg Hx    Prostate cancer Neg Hx    Social History   Socioeconomic History   Marital status: Married    Spouse name: Thayer Headings   Number of children: 2   Years of education: Not on file   Highest education level: Not on file  Occupational History   Occupation: retired, Nurse, learning disability  Tobacco Use   Smoking status: Former    Types: Cigarettes    Quit date: 09/21/1984    Years since quitting: 37.7   Smokeless tobacco: Never   Tobacco comments:    smoked 1958-1986, up to 1 ppd  Vaping Use   Vaping Use: Never used  Substance and Sexual Activity   Alcohol use: Yes    Comment: socially on occasion   Drug use: No   Sexual activity: Not on file  Other Topics Concern   Not on file  Social History Narrative   Household- pt and wife   2 daughters, one is a Therapist, sports @ [employer]    R handed   Caffeine: 3 C of coffee a day   Social Determinants of Health   Financial Resource Strain: Medium Risk (05/11/2022)   Overall Financial Resource Strain (CARDIA)    Difficulty of Paying Living Expenses: Somewhat hard  Food Insecurity: No Food Insecurity (04/29/2021)   Hunger Vital Sign    Worried About Running Out of Food in the Last Year: Never true    Ran Out of Food in the Last Year: Never true  Transportation Needs: No Transportation Needs (05/11/2022)  PRAPARE - Hydrologist (Medical): No    Lack of Transportation (Non-Medical): No  Physical Activity: Inactive (04/29/2021)   Exercise Vital Sign    Days of Exercise per Week: 0 days    Minutes of Exercise per Session: 0 min  Stress: Stress Concern Present (05/17/2020)   [place]    Feeling of Stress : To some extent  Social Connections: Moderately Integrated (04/29/2021)   Social Connection and Isolation Panel [NHANES]    Frequency of Communication with Friends and Family: More than three times a week    Frequency of Social Gatherings with Friends and Family: More than three times a week    Attends Religious Services: Never    Marine scientist or Organizations: Yes    Attends Archivist Meetings: 1 to 4 times per year    Marital Status: Married    Tobacco Counseling Counseling given: Not Answered Tobacco comments: smoked 1958-1986, up to 1 ppd   Clinical Intake:  Pre-visit preparation completed: Yes  Pain : No/denies pain     Diabetes: No  How often do you need to have someone help you when you read instructions, pamphlets, or other written materials from your doctor or pharmacy?: 1 - Never  Diabetic? No  Activities of Daily Living    06/12/2022    1:05 PM  In your present state of health, do you have any difficulty performing the following activities:  Hearing? 1  Comment slight hearing loss   Vision? 0  Difficulty concentrating or making decisions? 1  Walking or climbing stairs? 0  Dressing or bathing? 0  Doing errands, shopping? 0  Preparing Food and eating ? N  Using the Toilet? N  In the past six months, have you accidently leaked urine? Y  Do you have problems with loss of bowel control? N  Managing your Medications? N  Managing your Finances? N  Housekeeping or managing your Housekeeping? N    Patient Care Team: Colon Branch, MD as PCP - General (Internal Medicine) Josue Hector, MD as Consulting Physician (Cardiology) Allyn Kenner, MD (Dermatology) Gaynelle Arabian, MD as Consulting Physician (Orthopedic Surgery) Newman Pies, MD as Consulting Physician (Neurosurgery) Marygrace Drought, MD as Consulting Physician (Ophthalmology)  Indicate any recent Medical Services you may have received from other than [facility] providers in the past year (date may be approximate).     Assessment:   This is a routine wellness examination for Lamine.  Hearing/Vision screen No results found.  Dietary issues and exercise activities discussed: Current Exercise Habits: The patient does not participate in regular exercise at present (just walks around the house and to check the mail), Exercise limited by: None identified   Goals Addressed   None    Depression Screen    06/12/2022    1:05 PM 05/28/2022   10:16 AM 11/10/2021    2:19 PM 08/04/2021   10:50 AM 04/29/2021    3:37 PM 02/24/2021   11:07 AM 10/25/2020   10:26 AM  PHQ 2/9 Scores  PHQ - 2 Score 0 0 0 0 0 0 0    Fall Risk    06/12/2022    1:03 PM 05/28/2022   10:15 AM 11/10/2021    2:19 PM 08/04/2021   10:50 AM 04/29/2021    3:37 PM  [place] in the past year? 0 0 0 0 0  Number falls in past yr:  0 0 0 0 0  Injury with Fall? 0 0 0 0 0  Risk for fall due to : No Fall Risks      Follow up Falls evaluation completed Falls evaluation completed Falls evaluation completed Falls evaluation completed Falls prevention  discussed    FALL RISK PREVENTION PERTAINING TO THE HOME:  Any stairs in or around the home? Yes  If so, are there any without handrails? No  Home free of loose throw rugs in walkways, pet beds, electrical cords, etc? Yes  Adequate lighting in your home to reduce risk of falls? Yes   ASSISTIVE DEVICES UTILIZED TO PREVENT FALLS:  Life alert? No  Use of a cane, walker or w/c? Yes  Grab bars in the bathroom? Yes  Shower chair or bench in shower? No  Elevated toilet seat or a handicapped toilet? Yes   TIMED UP AND GO:  Was the test performed? No . Audio visit   Cognitive Function:        06/12/2022    1:09 PM  6CIT Screen  What Year? 4 points  What month? 3 points  What time? 0 points  Count back from 20 0 points  Months in reverse 4 points  Repeat phrase 10 points  Total Score 21 points    Immunizations Immunization History  Administered Date(s) Administered   Fluad Quad(high Dose 65+) 08/04/2019, 10/25/2020, 08/04/2021   Influenza Split 08/12/2011, 09/06/2012   Influenza Whole 07/23/2006, 08/10/2007, 06/27/2008, 08/21/2009, 08/19/2010   Influenza, High Dose Seasonal PF 07/20/2013, 09/19/2015, 09/07/2018   Influenza,inj,Quad PF,6+ Mos 07/14/2016   Influenza-Unspecified 06/21/2014, 10/01/2017   PFIZER(Purple Top)SARS-COV-2 Vaccination 11/23/2019, 12/19/2019   PNEUMOCOCCAL CONJUGATE-20 11/10/2021   Pneumococcal Conjugate-13 02/19/2015   Pneumococcal Polysaccharide-23 09/01/2013   Td 09/23/1998   Tdap 09/01/2013   Zoster, Live 09/29/2013    TDAP status: Up to date  Flu Vaccine status: Up to date  Pneumococcal vaccine status: Up to date  Covid-19 vaccine status: Declined, Education has been provided regarding the importance of this vaccine but patient still declined. Advised may receive this vaccine at local pharmacy or Health Dept.or vaccine clinic. Aware to provide a copy of the vaccination record if obtained from local pharmacy or Health Dept. Verbalized  acceptance and understanding.  Qualifies for Shingles Vaccine? Yes   Zostavax completed Yes   Shingrix Completed?: No.    Education has been provided regarding the importance of this vaccine. Patient has been advised to call insurance company to determine out of pocket expense if they have not yet received this vaccine. Advised may also receive vaccine at local pharmacy or Health Dept. Verbalized acceptance and understanding.  Screening Tests Health Maintenance  Topic Date Due   INFLUENZA VACCINE  12/20/2022 (Originally 04/21/2022)   TETANUS/TDAP  09/02/2023   Pneumonia Vaccine 49+ Years old  Completed   HPV VACCINES  Aged Out   COVID-19 Vaccine  Discontinued   Zoster Vaccines- Shingrix  Discontinued    Health Maintenance  There are no preventive care reminders to display for this patient.  Colorectal cancer screening: No longer required.   Lung Cancer Screening: (Low Dose CT Chest recommended if Age 69-80 years, 30 pack-year currently smoking OR have quit w/in 15years.) does not qualify.   Lung Cancer Screening Referral: N/a  Additional Screening:  Hepatitis C Screening: does not qualify; Completed N/a  Vision Screening: Recommended annual ophthalmology exams for early detection of glaucoma and other disorders of the eye. Is the patient up to date with their annual eye  exam?  Yes  Who is the provider or what is the name of the office in which the patient attends annual eye exams? Dr. Satira Sark If pt is not established with a provider, would they like to be referred to a provider to establish care? No .   Dental Screening: Recommended annual dental exams for proper oral hygiene  Community Resource Referral / Chronic Care Management: CRR required this visit?  No   CCM required this visit?  No      Plan:     I have personally reviewed and noted the following in the patient's chart:   Medical and social history Use of alcohol, tobacco or illicit drugs  Current medications  and supplements including opioid prescriptions. Patient is not currently taking opioid prescriptions. Functional ability and status Nutritional status Physical activity Advanced directives List of other physicians Hospitalizations, surgeries, and ER visits in previous 12 months Vitals Screenings to include cognitive, depression, and falls Referrals and appointments  In addition, I have reviewed and discussed with patient certain preventive protocols, quality metrics, and best practice recommendations. A written personalized care plan for preventive services as well as general preventive health recommendations were provided to patient.   Due to this being a telephonic visit, the after visit summary with patients personalized plan was offered to patient via mail or my-chart. Patient was mailed a copy of AVS.   Beatris Ship, [place]   06/12/2022   Nurse Notes: None   I have reviewed and agree with [facility] documentation.  [name], MD

## 2022-06-16 ENCOUNTER — Encounter (HOSPITAL_BASED_OUTPATIENT_CLINIC_OR_DEPARTMENT_OTHER): Payer: Medicare Other | Admitting: General Surgery

## 2022-06-16 DIAGNOSIS — S81801A Unspecified open wound, right lower leg, initial encounter: Secondary | ICD-10-CM | POA: Diagnosis not present

## 2022-06-16 DIAGNOSIS — Z7901 Long term (current) use of anticoagulants: Secondary | ICD-10-CM | POA: Diagnosis not present

## 2022-06-16 DIAGNOSIS — I480 Paroxysmal atrial fibrillation: Secondary | ICD-10-CM | POA: Diagnosis not present

## 2022-06-16 DIAGNOSIS — I10 Essential (primary) hypertension: Secondary | ICD-10-CM | POA: Diagnosis not present

## 2022-06-16 DIAGNOSIS — L97222 Non-pressure chronic ulcer of left calf with fat layer exposed: Secondary | ICD-10-CM | POA: Diagnosis not present

## 2022-06-16 DIAGNOSIS — L97822 Non-pressure chronic ulcer of other part of left lower leg with fat layer exposed: Secondary | ICD-10-CM | POA: Diagnosis not present

## 2022-06-16 DIAGNOSIS — I872 Venous insufficiency (chronic) (peripheral): Secondary | ICD-10-CM | POA: Diagnosis not present

## 2022-06-16 DIAGNOSIS — I2510 Atherosclerotic heart disease of native coronary artery without angina pectoris: Secondary | ICD-10-CM | POA: Diagnosis not present

## 2022-06-16 DIAGNOSIS — I890 Lymphedema, not elsewhere classified: Secondary | ICD-10-CM | POA: Diagnosis not present

## 2022-06-16 NOTE — Progress Notes (Addendum)
DAWSON, ALBERS ([id number]) Visit Report for 06/16/2022 Arrival Information Details Patient Name: Date of Service: [name], [place] RD E. 06/16/2022 1:15 PM Medical Record Number: [id number] Patient Account Number: [id number] Date of Birth/Sex: Treating RN: 1936/02/19 (86 y.o. [name] Primary Care Keyleigh Manninen: [name] Other Clinician: Referring Annia Gomm: Treating Ladye Macnaughton/Extender: [name] in Treatment: 6 Visit Information History Since Last Visit Added or deleted any medications: No Patient Arrived: [name] Any new allergies or adverse reactions: No Arrival Time: 14:26 Had a fall or experienced change in No Accompanied By: spouse activities of daily living that may affect Transfer Assistance: None risk of falls: Patient Identification Verified: Yes Signs or symptoms of abuse/neglect since last visito No Secondary Verification Process Completed: Yes Hospitalized since last visit: No Patient Requires Transmission-Based Precautions: No Implantable device outside of the clinic excluding No Patient Has Alerts: Yes cellular tissue based products placed in the center Patient Alerts: R ABI = 1.09, TBI= .77 since last visit: L ABI = 1.02, TBI = .83 Has Dressing in Place as Prescribed: Yes Has Compression in Place as Prescribed: Yes Pain Present Now: No Electronic Signature(s) Signed: 06/16/2022 5:30:40 PM By: Baruch Gouty RN, BSN Entered By: Baruch Gouty on 06/16/2022 14:27:01 -------------------------------------------------------------------------------- Clinic Level of Care Assessment Details Patient Name: Date of Service: [name], [name] 06/16/2022 1:15 PM Medical Record Number: [id number] Patient Account Number: [id number] Date of Birth/Sex: Treating RN: 1936/06/10 (86 y.o. [name] Primary Care Jeweliana Dudgeon: [name] Other Clinician: Referring Erika Slaby: Treating Jahrel Borthwick/Extender: [name] in Treatment:  6 Clinic Level of Care Assessment Items TOOL 4 Quantity Score '[]'$  - 0 Use when only an EandM is performed on FOLLOW-UP visit ASSESSMENTS - Nursing Assessment / Reassessment X- 1 10 Reassessment of Co-morbidities (includes updates in patient status) X- 1 5 Reassessment of Adherence to Treatment Plan ASSESSMENTS - Wound and Skin A ssessment / Reassessment X - Simple Wound Assessment / Reassessment - one wound 1 5 '[]'$  - 0 Complex Wound Assessment / Reassessment - multiple wounds '[]'$  - 0 Dermatologic / Skin Assessment (not related to wound area) ASSESSMENTS - Focused Assessment X- 1 5 Circumferential Edema Measurements - multi extremities '[]'$  - 0 Nutritional Assessment / Counseling / Intervention X- 1 5 Lower Extremity Assessment (monofilament, tuning fork, pulses) '[]'$  - 0 Peripheral Arterial Disease Assessment (using hand held doppler) ASSESSMENTS - Ostomy and/or Continence Assessment and Care '[]'$  - 0 Incontinence Assessment and Management '[]'$  - 0 Ostomy Care Assessment and Management (repouching, etc.) PROCESS - Coordination of Care X - Simple Patient / Family Education for ongoing care 1 15 '[]'$  - 0 Complex (extensive) Patient / Family Education for ongoing care X- 1 10 Staff obtains Programmer, systems, Records, T Results / Process Orders est '[]'$  - 0 Staff telephones HHA, Nursing Homes / Clarify orders / etc '[]'$  - 0 Routine Transfer to another Facility (non-emergent condition) '[]'$  - 0 Routine Hospital Admission (non-emergent condition) '[]'$  - 0 New Admissions / Biomedical engineer / Ordering NPWT Apligraf, etc. , '[]'$  - 0 Emergency Hospital Admission (emergent condition) X- 1 10 Simple Discharge Coordination '[]'$  - 0 Complex (extensive) Discharge Coordination PROCESS - Special Needs '[]'$  - 0 Pediatric / Minor Patient Management '[]'$  - 0 Isolation Patient Management '[]'$  - 0 Hearing / Language / Visual special needs '[]'$  - 0 Assessment of Community assistance (transportation, D/C planning,  etc.) '[]'$  - 0 Additional assistance / Altered mentation '[]'$  - 0 Support Surface(s) Assessment (bed, cushion, seat, etc.) INTERVENTIONS -  Wound Cleansing / Measurement X - Simple Wound Cleansing - one wound 1 5 '[]'$  - 0 Complex Wound Cleansing - multiple wounds X- 1 5 Wound Imaging (photographs - any number of wounds) '[]'$  - 0 Wound Tracing (instead of photographs) '[]'$  - 0 Simple Wound Measurement - one wound '[]'$  - 0 Complex Wound Measurement - multiple wounds INTERVENTIONS - Wound Dressings '[]'$  - 0 Small Wound Dressing one or multiple wounds '[]'$  - 0 Medium Wound Dressing one or multiple wounds '[]'$  - 0 Large Wound Dressing one or multiple wounds '[]'$  - 0 Application of Medications - topical '[]'$  - 0 Application of Medications - injection INTERVENTIONS - Miscellaneous '[]'$  - 0 External ear exam '[]'$  - 0 Specimen Collection (cultures, biopsies, blood, body fluids, etc.) '[]'$  - 0 Specimen(s) / Culture(s) sent or taken to Lab for analysis '[]'$  - 0 Patient Transfer (multiple staff / Civil Service fast streamer / Similar devices) '[]'$  - 0 Simple Staple / Suture removal (25 or less) '[]'$  - 0 Complex Staple / Suture removal (26 or more) '[]'$  - 0 Hypo / Hyperglycemic Management (close monitor of Blood Glucose) '[]'$  - 0 Ankle / Brachial Index (ABI) - do not check if billed separately X- 1 5 Vital Signs Has the patient been seen at the hospital within the last three years: Yes Total Score: 80 Level Of Care: New/Established - Level 3 Electronic Signature(s) Signed: 06/16/2022 5:30:40 PM By: Baruch Gouty RN, BSN Entered By: Baruch Gouty on 06/16/2022 16:27:19 -------------------------------------------------------------------------------- Encounter Discharge Information Details Patient Name: Date of Service: [name], [name] 06/16/2022 1:15 PM Medical Record Number: [id number] Patient Account Number: [id number] Date of Birth/Sex: Treating RN: 02-Feb-1936 (86 y.o. [name] Primary Care Samil Mecham: [name]  Other Clinician: Referring Amelya Mabry: Treating Felicia Bloomquist/Extender: [name] in Treatment: 6 Encounter Discharge Information Items Discharge Condition: Stable Ambulatory Status: Cane Discharge Destination: Home Transportation: Private Auto Accompanied By: spouse Schedule Follow-up Appointment: Yes Clinical Summary of Care: Patient Declined Electronic Signature(s) Signed: 06/16/2022 5:30:40 PM By: Baruch Gouty RN, BSN Entered By: Baruch Gouty on 06/16/2022 16:27:51 -------------------------------------------------------------------------------- Lower Extremity Assessment Details Patient Name: Date of Service: [name], [name] 06/16/2022 1:15 PM Medical Record Number: [id number] Patient Account Number: [id number] Date of Birth/Sex: Treating RN: March 04, 1936 (86 y.o. [name] Primary Care Ardys Hataway: [name] Other Clinician: Referring Masha Orbach: Treating Omar Orrego/Extender: [name] in Treatment: 6 Edema Assessment Assessed: [Left: No] [Right: No] Edema: [Left: Ye] [Right: s] Calf Left: Right: Point of Measurement: 30 cm From Medial Instep 35 cm Ankle Left: Right: Point of Measurement: 13 cm From Medial Instep 23 cm Vascular Assessment Pulses: Dorsalis Pedis Palpable: [Left:Yes] Electronic Signature(s) Signed: 06/16/2022 5:30:40 PM By: Baruch Gouty RN, BSN Entered By: Baruch Gouty on 06/16/2022 14:27:54 -------------------------------------------------------------------------------- Multi Wound Chart Details Patient Name: Date of Service: [name], [name] 06/16/2022 1:15 PM Medical Record Number: [id number] Patient Account Number: [id number] Date of Birth/Sex: Treating RN: 1936-04-14 (86 y.o. [name] Primary Care Pa Tennant: [name] Other Clinician: Referring Mariaeduarda Defranco: Treating Stillman Buenger/Extender: [name] in Treatment: 6 Vital Signs Height(in): 65 Pulse(bpm):  14 Weight(lbs): 195 Blood Pressure(mmHg): 145/70 Body Mass Index(BMI): 32.4 Temperature(F): 97.8 Respiratory Rate(breaths/min): 18 Photos: [N/A:N/A] Left, Posterior Lower Leg N/A N/A Wound Location: Gradually Appeared N/A N/A Wounding Event: Venous Leg Ulcer N/A N/A Primary Etiology: Lymphedema N/A N/A Secondary Etiology: Glaucoma, Arrhythmia, Coronary N/A N/A Comorbid History: Artery Disease, Hypertension, Peripheral Venous Disease, Osteoarthritis 03/30/2022 N/A N/A Date Acquired: 6  N/A N/A Weeks of Treatment: Healed - Epithelialized N/A N/A Wound Status: No N/A N/A Wound Recurrence: 0x0x0 N/A N/A Measurements L x W x D (cm) 0 N/A N/A A (cm) : rea 0 N/A N/A Volume (cm) : 100.00% N/A N/A % Reduction in Area: 100.00% N/A N/A % Reduction in Volume: Full Thickness Without Exposed N/A N/A Classification: Support Structures Small N/A N/A Exudate Amount: Serosanguineous N/A N/A Exudate Type: red, brown N/A N/A Exudate Color: Flat and Intact N/A N/A Wound Margin: None Present (0%) N/A N/A Granulation Amount: Large (67-100%) N/A N/A Necrotic Amount: Fat Layer (Subcutaneous Tissue): Yes N/A N/A Exposed Structures: Fascia: No Tendon: No Muscle: No Joint: No Bone: No Medium (34-66%) N/A N/A Epithelialization: Treatment Notes Electronic Signature(s) Signed: 06/16/2022 2:40:51 PM By: Fredirick Maudlin MD FACS Signed: 06/16/2022 5:30:40 PM By: Baruch Gouty RN, BSN Entered By: Fredirick Maudlin on 06/16/2022 14:40:51 -------------------------------------------------------------------------------- Multi-Disciplinary Care Plan Details Patient Name: Date of Service: [name], [name] 06/16/2022 1:15 PM Medical Record Number: [id number] Patient Account Number: [id number] Date of Birth/Sex: Treating RN: 10-08-1935 (86 y.o. [name] Primary Care Analaura Messler: [name] Other Clinician: Referring Kaylyn Garrow: Treating Durant Scibilia/Extender: [name] in Treatment: 6 [place] reviewed with physician Active Inactive Electronic Signature(s) Signed: 06/16/2022 5:30:40 PM By: Baruch Gouty RN, BSN Entered By: Baruch Gouty on 06/16/2022 16:26:30 -------------------------------------------------------------------------------- Pain Assessment Details Patient Name: Date of Service: [name], [name] 06/16/2022 1:15 PM Medical Record Number: [id number] Patient Account Number: [id number] Date of Birth/Sex: Treating RN: July 07, 1936 (86 y.o. [name] Primary Care Audel Coakley: [name] Other Clinician: Referring Angelyse Heslin: Treating Rett Stehlik/Extender: [name] in Treatment: 6 Active Problems Location of Pain Severity and Description of Pain Patient Has Paino No Site Locations Rate the pain. Current Pain Level: 0 Pain Management and Medication Current Pain Management: Electronic Signature(s) Signed: 06/16/2022 5:30:40 PM By: Baruch Gouty RN, BSN Entered By: Baruch Gouty on 06/16/2022 14:26:35 -------------------------------------------------------------------------------- Patient/Caregiver Education Details Patient Name: Date of Service: [name], Elizabeth 9/26/2023andnbsp1:15 PM Medical Record Number: [id number] Patient Account Number: [id number] Date of Birth/Gender: Treating RN: [birth year] (86 y.o. [name] Primary Care Physician: [name] Other Clinician: Referring Physician: Treating Physician/Extender: [name] in Treatment: 6 Education Assessment Education Provided To: Patient Education Topics Provided Venous: Methods: Explain/Verbal Responses: Reinforcements needed, State content correctly Wound/Skin Impairment: Methods: Explain/Verbal Responses: Reinforcements needed, State content correctly Electronic Signature(s) Signed: 06/16/2022 5:30:40 PM By: Baruch Gouty RN, BSN Entered By: Baruch Gouty on 06/16/2022 14:31:41 -------------------------------------------------------------------------------- Wound Assessment Details Patient Name: Date of Service: [name], [name] 06/16/2022 1:15 PM Medical Record Number: [id number] Patient Account Number: [id number] Date of Birth/Sex: Treating RN: [birth year] (86 y.o. [name] Primary Care Lindee Leason: [name] Other Clinician: Referring Mayjor Ager: Treating Toniesha Zellner/Extender: [name] in Treatment: 6 Wound Status Wound Number: 2 Primary Venous Leg Ulcer Etiology: Wound Location: Left, Posterior Lower Leg Secondary Lymphedema Wounding Event: Gradually Appeared Etiology: Date Acquired: 03/30/2022 Wound Healed - Epithelialized Weeks Of Treatment: 6 Status: Clustered Wound: No Comorbid Glaucoma, Arrhythmia, Coronary Artery Disease, Hypertension, History: Peripheral Venous Disease, Osteoarthritis Photos Wound Measurements Length: (cm) Width: (cm) Depth: (cm) Area: (cm) Volume: (cm) 0 % Reduction in Area: 100% 0 % Reduction in Volume: 100% 0 Epithelialization: Medium (34-66%) 0 Tunneling: No 0 Undermining: No Wound Description Classification: Full Thickness Without Exposed Support Structures Wound Margin: Flat and Intact Exudate Amount: Small Exudate Type: Serosanguineous Exudate  Color: red, brown Foul Odor After Cleansing: No Slough/Fibrino Yes Wound Bed Granulation Amount: None Present (0%) Exposed Structure Necrotic Amount: Large (67-100%) Fascia Exposed: No Fat Layer (Subcutaneous Tissue) Exposed: Yes Tendon Exposed: No Muscle Exposed: No Joint Exposed: No Bone Exposed: No Electronic Signature(s) Signed: 06/16/2022 5:30:40 PM By: Baruch Gouty RN, BSN Entered By: Baruch Gouty on 06/16/2022 14:38:05 -------------------------------------------------------------------------------- [place] Details Patient Name: Date of Service: [name], [name] 06/16/2022 1:15  PM Medical Record Number: [id number] Patient Account Number: [id number] Date of Birth/Sex: Treating RN: 1936/04/11 (86 y.o. [name] Primary Care Takako Minckler: [name] Other Clinician: Referring Cloie Wooden: Treating Austynn Pridmore/Extender: [name] in Treatment: 6 Vital Signs Time Taken: 14:06 Temperature (F): 97.8 Height (in): 65 Pulse (bpm): 71 Weight (lbs): 195 Respiratory Rate (breaths/min): 18 Body Mass Index (BMI): 32.4 Blood Pressure (mmHg): 145/70 Reference Range: 80 - 120 mg / dl Electronic Signature(s) Signed: 06/16/2022 2:06:31 PM By: Valeria Batman EMT Entered By: Valeria Batman on 06/16/2022 14:06:31

## 2022-06-17 NOTE — Progress Notes (Signed)
[name], [name] ([id number]) Visit Report for 06/16/2022 Chief Complaint Document Details Patient Name: Date of Service: [name], [name] 06/16/2022 1:15 PM Medical Record Number: [id number] Patient Account Number: [id number] Date of Birth/Sex: Treating RN: 29-Jun-1936 (86 y.o. [name] Primary Care Provider: Kathlene [name] Other Clinician: Referring Provider: Treating Provider/Extender: [name] in Treatment: 6 Information Obtained from: Patient Chief Complaint Left lower extremity wound Electronic Signature(s) Signed: 06/16/2022 2:40:57 PM By: [name] Maudlin MD FACS Entered By: [name] on 06/16/2022 14:40:57 -------------------------------------------------------------------------------- HPI Details Patient Name: Date of Service: [name], [name] 06/16/2022 1:15 PM Medical Record Number: [id number] Patient Account Number: [id number] Date of Birth/Sex: Treating RN: 1936-02-07 (86 y.o. [name] Primary Care Provider: Kathlene [name] Other Clinician: Referring Provider: Treating Provider/Extender: [name] in Treatment: 6 History of Present Illness HPI Description: [name] is an 86 year old male with a past medical history of CAD s/p DES to LCx 2006, carotid artery disease, hypertension, hyperlipidemia, paroxysmal atrial fibrillation on Eliquis and Cardizem that presents today for right lower extremity wound. He was seen by Dr. Larose [name], his primary care provider on 4/29 for this issue and was referred to our clinic. Patient states that he had a small wound that spontaneously started in October 2021 and has not healed. He states this has progressively gotten larger. He has been using acne cream prescribed by the dermatologist for this issue. He reports drainage to the wound but no purulent drainage. He reports mild soreness to the wound. He has swelling in his right leg greater than left and reports this is a chronic  issue for the past 1 to 2 years. He denies resting leg pain or pain with ambulation. Of note He was prescribed doxycycline at the end of last month and has finished this course for possible skin infection to the wound site. 02/06/2021; I am seeing this patient who was admitted to the clinic last week by Dr. Heber [name] He has predominantly I think a venous insufficiency wound on the right medial lower leg he has been using Santyl Hydrofera Blue under 3 layer compression. Arterial studies were ordered last week but do not seem to been put through. He is not a diabetic. He did have venous reflux studies done in August 2020. He did have abnormal reflux time was noted in the great saphenous vein in the distal thigh and the great saphenous vein at the knee. There was no evidence of DVT or SVT at the time he was not felt to have large enough saphenous veins on either side for intervention. Compression stockings at least knee-high were recommended. Follow-up was on a as needed basis with Dr. Donzetta [name] The patient does not describe claudication but his pulses in his feet are not vibrant this could be because of the swelling 5/26; patient presents for 1 week follow-up. He has been using Hydrofera Blue under 3 layer compression. He had arterial studies done. He has no issues or complaints today. He denies signs of infection. He has his juxta light compressions with him today. 6/3; patient presents for 1 week follow-up. He has been using Hydrofera Blue under 3 layer compression. He denies signs and symptoms of infection. He did have bright green drainage which he states he has not seen before when the dressing was taken off today. He is using his juxta light compression to the left leg. 6/10; patient presents for 1 week follow-up. He has been tolerating Hydrofera Blue with 3  layer compression. He again reports bright green drainage. However he denies any signs of infection. He has been using juxta light compression to  the left leg. 6/24; patient presents for 2-week follow-up. He has been tolerating Hydrofera Blue with 3 layer compression. Today he is healed. He brought his juxta lite compression for the right leg and continues to wear the juxta light compression on the left leg READMISSION 05/05/2022 The patient returns to clinic today with a new wound on his left posterior leg. He has been wearing his juxta lite stockings on a regular basis, but on exam he still has 3+ pitting edema. I suspect he has lymphedema in addition to his known venous reflux. The wounds are scattered geographic wounds with light slough accumulation. They have been applying mupirocin and CeraVe ointment. He does have a VP shunt placement scheduled for next week, but it sounds like his neurosurgeon is not aware of the fact that he has an open wound. 05/11/2022: His VP shunt placement has been postponed while we get his wound to heal. The wounds are all smaller today with just a little eschar and minimal slough. Edema control is good. No concern for infection. 05/18/2022: All of the open areas have contracted and there is good perimeter epithelialization. There is a little bit of eschar and slough on the open areas. Good edema control. No concern for infection. 05/26/2022: Many of the small open areas have closed. There is Hydrofera Blue sponge stuck in a number of places and this is unable to be removed by the intake nurse. There is a fair amount of eschar and a little bit of slough present. 06/02/2022: Continued closure of small open skin sites. There are just a couple remaining and these have a little eschar on the surface. Edema control is excellent. 06/09/2022: We are down to just 1 small open area. There is some eschar and slough present. No concern for infection. Edema control is excellent. 06/16/2022: His wound is healed. Electronic Signature(s) Signed: 06/16/2022 2:42:18 PM By: [name] Maudlin MD FACS Entered By: [name] on  06/16/2022 14:42:18 -------------------------------------------------------------------------------- Physical Exam Details Patient Name: Date of Service: [name], [name] 06/16/2022 1:15 PM Medical Record Number: [id number] Patient Account Number: [id number] Date of Birth/Sex: Treating RN: 01/08/1936 (86 y.o. [name] Primary Care Provider: Kathlene [name] Other Clinician: Referring Provider: Treating Provider/Extender: [name] in Treatment: 6 Constitutional Slightly hypertensive. . . . No acute distress.Marland Kitchen Respiratory Normal work of breathing on room air.. Notes 06/16/2022: His wound is healed. Electronic Signature(s) Signed: 06/16/2022 2:42:51 PM By: [name] Maudlin MD FACS Entered By: [name] on 06/16/2022 14:42:51 -------------------------------------------------------------------------------- Physician Orders Details Patient Name: Date of Service: [name], [name] 06/16/2022 1:15 PM Medical Record Number: [id number] Patient Account Number: [id number] Date of Birth/Sex: Treating RN: 10-15-1935 (86 y.o. [name] Primary Care Provider: Kathlene [name] Other Clinician: Referring Provider: Treating Provider/Extender: [name] in Treatment: 6 Verbal / Phone Orders: No Diagnosis Coding ICD-10 Coding Code Description I87.2 Venous insufficiency (chronic) (peripheral) I48.91 Unspecified atrial fibrillation I10 Essential (primary) hypertension I25.10 Atherosclerotic heart disease of native coronary artery without angina pectoris L97.822 Non-pressure chronic ulcer of other part of left lower leg with fat layer exposed Discharge From [facility] Discharge from [place] [facility] (In clinic) Topical Lidocaine 4% applied to wound bed Bathing/ Shower/ Hygiene May shower and wash wound with soap and water. Edema Control - Lymphedema / SCD / Other Elevate legs  to the level of the heart or above for 30  minutes daily and/or when sitting, a frequency of: - throughout the day Avoid standing for long periods of time. Exercise regularly - including ankle circles while sitting Moisturize legs daily. - both legs nightly after removing juxtalites Compression stocking or Garment 20-30 mm/Hg pressure to: - both legs daily Electronic Signature(s) Signed: 06/16/2022 2:43:01 PM By: [name] Maudlin MD FACS Entered By: [name] on 06/16/2022 14:43:00 -------------------------------------------------------------------------------- Problem List Details Patient Name: Date of Service: [name], [name] 06/16/2022 1:15 PM Medical Record Number: [id number] Patient Account Number: [id number] Date of Birth/Sex: Treating RN: 1936/05/31 (86 y.o. [name] Primary Care Provider: Kathlene [name] Other Clinician: Referring Provider: Treating Provider/Extender: [name] in Treatment: 6 Active Problems ICD-10 Encounter Code Description Active Date MDM Diagnosis I87.2 Venous insufficiency (chronic) (peripheral) 05/05/2022 No Yes I48.91 Unspecified atrial fibrillation 05/05/2022 No Yes I10 Essential (primary) hypertension 05/05/2022 No Yes I25.10 Atherosclerotic heart disease of native coronary artery without angina pectoris 05/05/2022 No Yes L97.822 Non-pressure chronic ulcer of other part of left lower leg with fat layer exposed8/15/2023 No Yes Inactive Problems Resolved Problems Electronic Signature(s) Signed: 06/16/2022 2:40:19 PM By: [name] Maudlin MD FACS Entered By: [name] on 06/16/2022 14:40:19 -------------------------------------------------------------------------------- Progress Note Details Patient Name: Date of Service: [name], [name] 06/16/2022 1:15 PM Medical Record Number: [id number] Patient Account Number: [id number] Date of Birth/Sex: Treating RN: [birth year] (86 y.o. [name] Primary Care Provider: Kathlene [name] Other  Clinician: Referring Provider: Treating Provider/Extender: [name] in Treatment: 6 Subjective Chief Complaint Information obtained from Patient Left lower extremity wound History of Present Illness (HPI) [name] is an 86 year old male with a past medical history of CAD s/p DES to LCx 2006, carotid artery disease, hypertension, hyperlipidemia, paroxysmal atrial fibrillation on Eliquis and Cardizem that presents today for right lower extremity wound. He was seen by Dr. Larose [name], his primary care provider on 4/29 for this issue and was referred to our clinic. Patient states that he had a small wound that spontaneously started in October 2021 and has not healed. He states this has progressively gotten larger. He has been using acne cream prescribed by the dermatologist for this issue. He reports drainage to the wound but no purulent drainage. He reports mild soreness to the wound. He has swelling in his right leg greater than left and reports this is a chronic issue for the past 1 to 2 years. He denies resting leg pain or pain with ambulation. Of note He was prescribed doxycycline at the end of last month and has finished this course for possible skin infection to the wound site. 02/06/2021; I am seeing this patient who was admitted to the clinic last week by Dr. Heber [place]. He has predominantly I think a venous insufficiency wound on the right medial lower leg he has been using Santyl Hydrofera Blue under 3 layer compression. Arterial studies were ordered last week but do not seem to been put through. He is not a diabetic. He did have venous reflux studies done in August 2020. He did have abnormal reflux time was noted in the great saphenous vein in the distal thigh and the great saphenous vein at the knee. There was no evidence of DVT or SVT at the time he was not felt to have large enough saphenous veins on either side for intervention. Compression stockings at least knee-high  were recommended. Follow-up was on a as needed basis  with Dr. Donzetta [name] The patient does not describe claudication but his pulses in his feet are not vibrant this could be because of the swelling 5/26; patient presents for 1 week follow-up. He has been using Hydrofera Blue under 3 layer compression. He had arterial studies done. He has no issues or complaints today. He denies signs of infection. He has his juxta light compressions with him today. 6/3; patient presents for 1 week follow-up. He has been using Hydrofera Blue under 3 layer compression. He denies signs and symptoms of infection. He did have bright green drainage which he states he has not seen before when the dressing was taken off today. He is using his juxta light compression to the left leg. 6/10; patient presents for 1 week follow-up. He has been tolerating Hydrofera Blue with 3 layer compression. He again reports bright green drainage. However he denies any signs of infection. He has been using juxta light compression to the left leg. 6/24; patient presents for 2-week follow-up. He has been tolerating Hydrofera Blue with 3 layer compression. Today he is healed. He brought his juxta lite compression for the right leg and continues to wear the juxta light compression on the left leg READMISSION 05/05/2022 The patient returns to clinic today with a new wound on his left posterior leg. He has been wearing his juxta lite stockings on a regular basis, but on exam he still has 3+ pitting edema. I suspect he has lymphedema in addition to his known venous reflux. The wounds are scattered geographic wounds with light slough accumulation. They have been applying mupirocin and CeraVe ointment. He does have a VP shunt placement scheduled for next week, but it sounds like his neurosurgeon is not aware of the fact that he has an open wound. 05/11/2022: His VP shunt placement has been postponed while we get his wound to heal. The wounds are all smaller  today with just a little eschar and minimal slough. Edema control is good. No concern for infection. 05/18/2022: All of the open areas have contracted and there is good perimeter epithelialization. There is a little bit of eschar and slough on the open areas. Good edema control. No concern for infection. 05/26/2022: Many of the small open areas have closed. There is Hydrofera Blue sponge stuck in a number of places and this is unable to be removed by the intake nurse. There is a fair amount of eschar and a little bit of slough present. 06/02/2022: Continued closure of small open skin sites. There are just a couple remaining and these have a little eschar on the surface. Edema control is excellent. 06/09/2022: We are down to just 1 small open area. There is some eschar and slough present. No concern for infection. Edema control is excellent. 06/16/2022: His wound is healed. Patient History Information obtained from Patient. Family History Cancer - Father, Diabetes - Maternal Grandparents, Heart Disease - Maternal Grandparents, Hypertension - Maternal Grandparents, No family history of Hereditary Spherocytosis, Kidney Disease, Lung Disease, Seizures, Stroke, Thyroid Problems, Tuberculosis. Social History Former smoker - Quit over 30 years ago, Marital Status - Married, Alcohol Use - Rarely, Drug Use - No History, Caffeine Use - Daily. Medical History Eyes Patient has history of Glaucoma Cardiovascular Patient has history of Arrhythmia - afib, Coronary Artery Disease, Hypertension, Peripheral Venous Disease Endocrine Denies history of Type I Diabetes, Type II Diabetes Genitourinary Denies history of End Stage Renal Disease Integumentary (Skin) Denies history of History of Burn Musculoskeletal Patient has history of Osteoarthritis Oncologic  Denies history of Received Chemotherapy, Received Radiation Psychiatric Denies history of Anorexia/bulimia, Confinement Anxiety Hospitalization/Surgery  History - lumbar laminectomy. - total knee arthropathy right. - bil total hip arthroplasty. - coronary stents. - toe surgery. - tonsillectomy. Medical A Surgical History Notes nd Gastrointestinal colon polyps Endocrine Hypothyroidism Genitourinary BPH Musculoskeletal Bilat Hip Replacements, Right Knee Replacement, lumbar stenosis Neurologic hydrocephalus Oncologic Skin Cancer multiple sites Objective Constitutional Slightly hypertensive. No acute distress.. Vitals Time Taken: 2:06 PM, Height: 65 in, Weight: 195 lbs, BMI: 32.4, Temperature: 97.8 F, Pulse: 71 bpm, Respiratory Rate: 18 breaths/min, Blood Pressure: 145/70 mmHg. Respiratory Normal work of breathing on room air.. General Notes: 06/16/2022: His wound is healed. Integumentary (Hair, Skin) Wound #2 status is Healed - Epithelialized. Original cause of wound was Gradually Appeared. The date acquired was: 03/30/2022. The wound has been in treatment 6 weeks. The wound is located on the Left,Posterior Lower Leg. The wound measures 0cm length x 0cm width x 0cm depth; 0cm^2 area and 0cm^3 volume. There is Fat Layer (Subcutaneous Tissue) exposed. There is no tunneling or undermining noted. There is a small amount of serosanguineous drainage noted. The wound margin is flat and intact. There is no granulation within the wound bed. There is a large (67-100%) amount of necrotic tissue within the wound bed. Assessment Active Problems ICD-10 Venous insufficiency (chronic) (peripheral) Unspecified atrial fibrillation Essential (primary) hypertension Atherosclerotic heart disease of native coronary artery without angina pectoris Non-pressure chronic ulcer of other part of left lower leg with fat layer exposed Plan Discharge From [facility] Services: Discharge from [place] Anesthetic: (In clinic) Topical Lidocaine 4% applied to wound bed Bathing/ Shower/ Hygiene: May shower and wash wound with soap and water. Edema Control -  Lymphedema / SCD / Other: Elevate legs to the level of the heart or above for 30 minutes daily and/or when sitting, a frequency of: - throughout the day Avoid standing for long periods of time. Exercise regularly - including ankle circles while sitting Moisturize legs daily. - both legs nightly after removing juxtalites Compression stocking or Garment 20-30 mm/Hg pressure to: - both legs daily 06/16/2022: His wound has healed. At this point, there is no contraindication from my perspective for him to undergo VP shunt placement. He should wear his juxta lite stockings daily and elevate his legs whenever possible throughout the day and at night. He may follow-up on an as-needed basis. Electronic Signature(s) Signed: 06/16/2022 2:43:40 PM By: [name] Maudlin MD FACS Entered By: [name] on 06/16/2022 14:43:39 -------------------------------------------------------------------------------- HxROS Details Patient Name: Date of Service: [name], [name] 06/16/2022 1:15 PM Medical Record Number: [id number] Patient Account Number: [id number] Date of Birth/Sex: Treating RN: 1936-07-21 (86 y.o. [name] Primary Care Provider: Kathlene [name] Other Clinician: Referring Provider: Treating Provider/Extender: [name] in Treatment: 6 Information Obtained From Patient Eyes Medical History: Positive for: Glaucoma Cardiovascular Medical History: Positive for: Arrhythmia - afib; Coronary Artery Disease; Hypertension; Peripheral Venous Disease Gastrointestinal Medical History: Past Medical History Notes: colon polyps Endocrine Medical History: Negative for: Type I Diabetes; Type II Diabetes Past Medical History Notes: Hypothyroidism Genitourinary Medical History: Negative for: End Stage Renal Disease Past Medical History Notes: BPH Integumentary (Skin) Medical History: Negative for: History of Burn Musculoskeletal Medical History: Positive for:  Osteoarthritis Past Medical History Notes: Bilat Hip Replacements, Right Knee Replacement, lumbar stenosis Neurologic Medical History: Past Medical History Notes: hydrocephalus Oncologic Medical History: Negative for: Received Chemotherapy; Received Radiation Past Medical History Notes: Skin Cancer multiple sites  Psychiatric Medical History: Negative for: Anorexia/bulimia; Confinement Anxiety HBO Extended History Items Eyes: Glaucoma Immunizations Pneumococcal Vaccine: Received Pneumococcal Vaccination: Yes Received Pneumococcal Vaccination On or After 60th Birthday: Yes Implantable Devices None Hospitalization / Surgery History Type of Hospitalization/Surgery lumbar laminectomy total knee arthropathy right bil total hip arthroplasty coronary stents toe surgery tonsillectomy Family and Social History Cancer: Yes - Father; Diabetes: Yes - Maternal Grandparents; Heart Disease: Yes - Maternal Grandparents; Hereditary Spherocytosis: No; Hypertension: Yes - Maternal Grandparents; Kidney Disease: No; Lung Disease: No; Seizures: No; Stroke: No; Thyroid Problems: No; Tuberculosis: No; Former smoker - Quit over 30 years ago; Marital Status - Married; Alcohol Use: Rarely; Drug Use: No History; Caffeine Use: Daily; Financial Concerns: No; Food, Clothing or Shelter Needs: No; Support System Lacking: No; Transportation Concerns: No Engineer, maintenance) Signed: 06/16/2022 3:02:45 PM By: [name] Maudlin MD FACS Signed: 06/16/2022 5:30:40 PM By: Baruch Gouty RN, BSN Entered By: [name] on 06/16/2022 14:42:25 -------------------------------------------------------------------------------- SuperBill Details Patient Name: Date of Service: [name], [name] 06/16/2022 Medical Record Number: [id number] Patient Account Number: [id number] Date of Birth/Sex: Treating RN: 05-03-1936 (86 y.o. [name] Primary Care Provider: Kathlene [name] Other Clinician: Referring  Provider: Treating Provider/Extender: [name] in Treatment: 6 Diagnosis Coding ICD-10 Codes Code Description I87.2 Venous insufficiency (chronic) (peripheral) I48.91 Unspecified atrial fibrillation I10 Essential (primary) hypertension I25.10 Atherosclerotic heart disease of native coronary artery without angina pectoris L97.822 Non-pressure chronic ulcer of other part of left lower leg with fat layer exposed Facility Procedures CPT4 Code: [id number] Description: 99213 - WOUND CARE VISIT-LEV 3 EST PT Modifier: Quantity: 1 Physician Procedures : CPT4 Code Description Modifier [id number] - WC PHYS LEVEL 3 - EST PT ICD-10 Diagnosis Description L97.822 Non-pressure chronic ulcer of other part of left lower leg with fat layer exposed I87.2 Venous insufficiency (chronic) (peripheral) I10  Essential (primary) hypertension I25.10 Atherosclerotic heart disease of native coronary artery without angina pectoris Quantity: 1 Electronic Signature(s) Signed: 06/16/2022 5:30:40 PM By: Baruch Gouty RN, BSN Signed: 06/17/2022 12:22:25 PM By: [name] Maudlin MD FACS Previous Signature: 06/16/2022 2:43:55 PM Version By: [name] Maudlin MD FACS Entered By: Baruch Gouty on 06/16/2022 16:27:27

## 2022-06-23 ENCOUNTER — Ambulatory Visit: Payer: Medicare Other | Admitting: Podiatry

## 2022-06-23 ENCOUNTER — Encounter (HOSPITAL_BASED_OUTPATIENT_CLINIC_OR_DEPARTMENT_OTHER): Payer: Medicare Other | Admitting: General Surgery

## 2022-06-23 ENCOUNTER — Encounter: Payer: Self-pay | Admitting: Podiatry

## 2022-06-23 DIAGNOSIS — M79674 Pain in right toe(s): Secondary | ICD-10-CM | POA: Diagnosis not present

## 2022-06-23 DIAGNOSIS — M79675 Pain in left toe(s): Secondary | ICD-10-CM

## 2022-06-23 DIAGNOSIS — B351 Tinea unguium: Secondary | ICD-10-CM | POA: Diagnosis not present

## 2022-06-23 DIAGNOSIS — M2011 Hallux valgus (acquired), right foot: Secondary | ICD-10-CM | POA: Diagnosis not present

## 2022-06-23 DIAGNOSIS — M2012 Hallux valgus (acquired), left foot: Secondary | ICD-10-CM | POA: Diagnosis not present

## 2022-06-23 DIAGNOSIS — M2041 Other hammer toe(s) (acquired), right foot: Secondary | ICD-10-CM

## 2022-06-23 DIAGNOSIS — M2042 Other hammer toe(s) (acquired), left foot: Secondary | ICD-10-CM | POA: Diagnosis not present

## 2022-06-23 DIAGNOSIS — I739 Peripheral vascular disease, unspecified: Secondary | ICD-10-CM

## 2022-06-23 DIAGNOSIS — L84 Corns and callosities: Secondary | ICD-10-CM | POA: Diagnosis not present

## 2022-06-23 NOTE — Progress Notes (Signed)
Subjective: [name] presents today referred by Colon Branch, MD for complaint of  Chief Complaint  Patient presents with   foot care      [place] NAIL TRIM   Patient presents  with his wife with chief concern of elongated, thickened, painful, discolored toenails for several months. Patient has tried Writer in the past.  Past Medical History:  Diagnosis Date   Arthritis    BPH (benign prostatic hypertrophy)    Carotid artery disease ([place])    Doppler, December 08, 2011, 00 10% R. ICA, 27-25% LICA, followup 1 year   Coronary artery disease    DES Circumflex or CVA 2006  /  clear, February, 2011, EF 70%, no ischemia or   Ejection fraction    EF 60%, echo, 2009, mildly calcified aortic leaflets   History of colonic polyps    Hyperlipidemia    Hypertension    Hypothyroidism    Lumbar radiculopathy    Multiple thyroid nodules    Avascular echogenic areas noted in the right thyroid at the time of carotid Doppler.    PONV (postoperative nausea and vomiting)    "nausea with first hip surgery"   Skin cancer (melanoma) ([place])    PMH of    Spinal stenosis, lumbar    Tremor    Fine tremor right upper extremity   Venous insufficiency      Patient Active Problem List   Diagnosis Date Noted   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[place]) 02/05/2022   Gait disturbance 02/05/2022   Benign essential tremor 02/05/2022   Chronic anticoagulation 02/05/2022   Atrial fibrillation ([place]) 02/08/2019   Lumbar stenosis with neurogenic claudication 07/13/2016   Annual physical exam>>>>>>>>>>>>>>>>>> 05/04/2016   PCP NOTES>>>>>>>>>>>>>>>>>>>>>>>>>>> 12/31/2015   Edema 02/19/2015   OA (osteoarthritis) of knee 06/25/2014   Unspecified sleep apnea 06/18/2014   Multiple thyroid nodules    Tremor    Carotid artery disease ([place])    Erectile dysfunction 05/31/2012   Coronary artery disease    Venous insufficiency    Hyperglycemia 11/09/2008   MELANOMA, HX OF 11/09/2008   COLONIC POLYPS, HYPERPLASTIC  05/10/2008   Hypothyroidism 08/10/2007   HYPERLIPIDEMIA 08/10/2007   Essential hypertension 08/10/2007   DJD (degenerative joint disease) 08/08/2007   MURMUR 08/08/2007   BENIGN PROSTATIC HYPERTROPHY, HX OF 08/05/2007     Past Surgical History:  Procedure Laterality Date   COLONOSCOPY     CORONARY ANGIOPLASTY WITH STENT PLACEMENT  2006   x 2 stents; DES to CX and dRCA '06   KNEE SURGERY  1970's   "chipped bone"   LUMBAR LAMINECTOMY/DECOMPRESSION MICRODISCECTOMY Left 07/13/2016   Procedure: LUMBAR LAMINECTOMY AND FORAMINOTOMY Lumbar two, three, Lumbar three-four, Lumbar four-five, LEFT Lumbar five-Sacral one DISECTOMY;  Surgeon: Newman Pies, MD;  Location: [facility];  Service: Neurosurgery;  Laterality: Left;   TOE SURGERY  1997   TONSILLECTOMY     TOTAL HIP ARTHROPLASTY Left 2007   TOTAL HIP ARTHROPLASTY Right 2010   TOTAL KNEE ARTHROPLASTY Right 06/25/2014   Procedure: RIGHT TOTAL KNEE ARTHROPLASTY;  Surgeon: Gearlean Alf, MD;  Location: [facility];  Service: Orthopedics;  Laterality: Right;     Current Outpatient Medications on File Prior to Visit  Medication Sig Dispense Refill   apixaban (ELIQUIS) 5 MG TABS tablet TAKE 1 TABLET BY MOUTH TWICE DAILY 180 tablet 1   aspirin 81 MG tablet Take 1 tablet (81 mg total) by mouth daily. (Patient taking differently: Take 81 mg by mouth daily. bedtime)  atorvastatin (LIPITOR) 40 MG tablet TAKE 1 AND 1/2 TABLETS(60 MG) BY MOUTH AT BEDTIME 135 tablet 1   benazepril (LOTENSIN) 20 MG tablet TAKE 1 AND 1/2 TABLETS(30 MG) BY MOUTH DAILY 135 tablet 1   diltiazem (DILACOR XR) 180 MG 24 hr capsule TAKE 1 CAPSULE(180 MG) BY MOUTH DAILY 90 capsule 1   furosemide (LASIX) 40 MG tablet TAKE 1 AND 1/2 TABLETS(60 MG) BY MOUTH TWICE DAILY (Patient taking differently: Take 60 mg by mouth daily.) 90 tablet 0   levothyroxine (SYNTHROID) 175 MCG tablet Take 1 tablet (175 mcg total) by mouth daily. 90 tablet 1   LUMIGAN 0.01 % SOLN Place 1 drop into both eyes  at bedtime.     metoprolol tartrate (LOPRESSOR) 25 MG tablet Take 0.5 tablets (12.5 mg total) by mouth 2 (two) times daily. 90 tablet 1   nitroGLYCERIN (NITROSTAT) 0.4 MG SL tablet PLACE 1 TABLET UNDER THE TONGUE EVERY 5 MINUTES AS NEEDED FOR CHEST PAIN 25 tablet 6   Timolol Maleate 0.5 % (DAILY) SOLN Apply 1 drop to eye in the morning and at bedtime. At breakfast and evening meals     No current facility-administered medications on file prior to visit.     Allergies  Allergen Reactions   Amlodipine Besylate Other (See Comments)    REACTION: edema     Social History   Occupational History   Occupation: retired, Nurse, learning disability  Tobacco Use   Smoking status: Former    Types: Cigarettes    Quit date: 09/21/1984    Years since quitting: 37.7   Smokeless tobacco: Never   Tobacco comments:    smoked 1958-1986, up to 1 ppd  Vaping Use   Vaping Use: Never used  Substance and Sexual Activity   Alcohol use: Yes    Comment: socially on occasion   Drug use: No   Sexual activity: Not on file     Family History  Problem Relation Age of Onset   Coronary artery disease Mother        carotid endarterectomy bilaterally   Colon cancer Father 17   Hypertension Father    Pancreatitis Father    Diabetes Sister    Diabetes Maternal Grandfather    Heart attack Maternal Grandfather 54   Heart attack Sister 29   Thyroid disease Neg Hx    Stroke Neg Hx    Prostate cancer Neg Hx      Immunization History  Administered Date(s) Administered   Fluad Quad(high Dose 65+) 08/04/2019, 10/25/2020, 08/04/2021   Influenza Split 08/12/2011, 09/06/2012   Influenza Whole 07/23/2006, 08/10/2007, 06/27/2008, 08/21/2009, 08/19/2010   Influenza, High Dose Seasonal PF 07/20/2013, 09/19/2015, 09/07/2018   Influenza,inj,Quad PF,6+ Mos 07/14/2016   Influenza-Unspecified 06/21/2014, 10/01/2017   PFIZER(Purple Top)SARS-COV-2 Vaccination 11/23/2019, 12/19/2019   PNEUMOCOCCAL CONJUGATE-20  11/10/2021   Pneumococcal Conjugate-13 02/19/2015   Pneumococcal Polysaccharide-23 09/01/2013   Td 09/23/1998   Tdap 09/01/2013   Zoster, Live 09/29/2013     Objective: There were no vitals filed for this visit.  MARCOS RUELAS is a pleasant 86 y.o. male WD, WN in NAD. AAO x 3.  Vascular Examination: Vascular status with faintly palpable pedal pulses b/l. Diminished PT pulses b/l. Pedal hair sparse b/l. CFT <3 seconds b/l. Trace edema b/l LE.  No pain with calf compression b/l. Skin temperature gradient WNL b/l. Evidence of chronic venous insufficiency b/l LE.  Neurological Examination: Sensation grossly intact b/l with 10 gram monofilament. Vibratory sensation intact b/l.   Dermatological  Examination: Pedal skin with normal turgor, texture and tone b/l. Toenails 1-5 b/l thick, discolored, elongated with subungual debris and pain on dorsal palpation. Hyperkeratotic lesion(s) distal tip of right 3rd toe, submet head 1 right foot, and submet head 5 right foot.  No erythema, no edema, no drainage, no fluctuance.  Musculoskeletal Examination: Muscle strength 5/5 to b/l LE. No pain, crepitus or joint limitation noted with ROM bilateral LE. HAV with bunion deformity noted b/l LE. Hammertoe(s) noted to the left second digit.  Radiographs: None  Last A1c:      Latest Ref Rng & Units 11/10/2021    2:56 PM 08/04/2021   11:31 AM  Hemoglobin A1C  Hemoglobin-A1c 4.6 - 6.5 % 6.1  6.3    Assessment: 1. Pain due to onychomycosis of toenails of both feet   2. Corns   3. Hallux valgus, acquired, bilateral   4. Acquired hammertoes of both feet   5. PAD (peripheral artery disease) ([place])     Plan: -Patient was evaluated and treated. All patient's and/or POA's questions/concerns answered on today's visit. -Toenails 1-5 b/l were debrided in length and girth with sterile nail nippers and dremel without iatrogenic bleeding.  -Corn(s) R 3rd toe and callus(es) submet head 1 right foot and submet  head 5 right foot were pared utilizing sterile scalpel blade without incident. Total number debrided =3. -Recommended Skechers shoes with stretchable uppers and memory foam insoles. They can be purchased at [employer] or [email or web address].  -Patient/POA to call should there be question/concern in the interim.  Return in about 3 months (around 09/23/2022).  Marzetta Board, DPM

## 2022-06-23 NOTE — Patient Instructions (Signed)
Recommend Skechers Loafers with stretchable uppers and memory foam insoles. They can be purchased at [employer]. Also on [email or web address].    Onychomycosis/Fungal Toenails  WHAT IS IT? An infection that lies within the keratin of your nail plate that is caused by a fungus.  WHY ME? Fungal infections affect all ages, sexes, races, and creeds.  There may be many factors that predispose you to a fungal infection such as age, coexisting medical conditions such as diabetes, or an autoimmune disease; stress, medications, fatigue, genetics, etc.  Bottom line: fungus thrives in a warm, moist environment and your shoes offer such a location.  IS IT CONTAGIOUS? Theoretically, yes.  You do not want to share shoes, nail clippers or files with someone who has fungal toenails.  Walking around barefoot in the same room or sleeping in the same bed is unlikely to transfer the organism.  It is important to realize, however, that fungus can spread easily from one nail to the next on the same foot.  HOW DO WE TREAT THIS?  There are several ways to treat this condition.  Treatment may depend on many factors such as age, medications, pregnancy, liver and kidney conditions, etc.  It is best to ask your doctor which options are available to you.  No treatment.   Unlike many other medical concerns, you can live with this condition.  However for many people this can be a painful condition and may lead to ingrown toenails or a bacterial infection.  It is recommended that you keep the nails cut short to help reduce the amount of fungal nail. Topical treatment.  These range from herbal remedies to prescription strength nail lacquers.  About 40-50% effective, topicals require twice daily application for approximately 9 to 12 months or until an entirely new nail has grown out.  The most effective topicals are medical grade medications available through physicians offices. Oral antifungal medications.  With an 80-90% cure rate, the  most common oral medication requires 3 to 4 months of therapy and stays in your system for a year as the new nail grows out.  Oral antifungal medications do require blood work to make sure it is a safe drug for you.  A liver function panel will be performed prior to starting the medication and after the first month of treatment.  It is important to have the blood work performed to avoid any harmful side effects.  In general, this medication safe but blood work is required. Laser Therapy.  This treatment is performed by applying a specialized laser to the affected nail plate.  This therapy is noninvasive, fast, and non-painful.  It is not covered by insurance and is therefore, out of pocket.  The results have been very good with a 80-95% cure rate.  [facility] is the only practice in the area to offer this therapy. Permanent Nail Avulsion.  Removing the entire nail so that a new nail will not grow back.

## 2022-06-24 ENCOUNTER — Other Ambulatory Visit: Payer: Self-pay | Admitting: Neurosurgery

## 2022-06-30 ENCOUNTER — Encounter (HOSPITAL_COMMUNITY): Payer: Self-pay

## 2022-06-30 ENCOUNTER — Other Ambulatory Visit: Payer: Self-pay | Admitting: Neurosurgery

## 2022-06-30 NOTE — Pre-Procedure Instructions (Signed)
Surgical Instructions    Your procedure is scheduled on Thursday, July 09, 2022 at 12:06 PM.  Report to [facility] Main Entrance "A" at 10:00 A.M., then check in with the Admitting office.  Call this number if you have problems the morning of surgery:  [phone]   If you have any questions prior to your surgery date call [phone]: Open Monday-Friday 8am-4pm  *If you experience any cold or flu symptoms such as cough, fever, chills, shortness of breath, etc. between now and your scheduled surgery, please notify us.*    Remember:  Do not eat after midnight the night before your surgery  You may drink clear liquids until 9:00 AM the morning of your surgery.   Clear liquids allowed are: Water, Non-Citrus Juices (without pulp), Carbonated Beverages, Clear Tea, Black Coffee Only (NO MILK, CREAM OR POWDERED CREAMER of any kind), and Gatorade.    Take these medicines the morning of surgery with A SIP OF WATER:  diltiazem (DILACOR XR) levothyroxine (SYNTHROID)  metoprolol tartrate (LOPRESSOR) Timolol Maleate 0.5 % (DAILY) SOLN nitroGLYCERIN (NITROSTAT) - if needed  Follow your surgeon's instructions on when to stop Aspirin and apixaban (ELIQUIS).  If no instructions were given by your surgeon then you will need to call the office to get those instructions.    As of today, STOP taking any Aleve, Naproxen, Ibuprofen, Motrin, Advil, Goody's, BC's, all herbal medications, fish oil, and all vitamins.                     Do NOT Smoke (Tobacco/Vaping) for 24 hours prior to your procedure.  If you use a CPAP at night, you may bring your mask/headgear for your overnight stay.   Contacts, glasses, piercing's, hearing aid's, dentures or partials may not be worn into surgery, please bring cases for these belongings.    For patients admitted to the hospital, discharge time will be determined by your treatment team.   Patients discharged the day of surgery will not be allowed to drive  home, and someone needs to stay with them for 24 hours.  SURGICAL WAITING ROOM VISITATION Patients having surgery or a procedure may have two support people in the waiting area. Visitors may stay in the waiting area during the procedure and switch out with other visitors if needed. Children under the age of 60 must have an adult accompany them who is not the patient. If the patient needs to stay at the hospital during part of their recovery, the visitor guidelines for inpatient rooms apply.  Please refer to the [facility] website for the visitor guidelines for Inpatients (after your surgery is over and you are in a regular room).    Special instructions:   [place]- Preparing For Surgery  Before surgery, you can play an important role. Because skin is not sterile, your skin needs to be as free of germs as possible. You can reduce the number of germs on your skin by washing with CHG (chlorahexidine gluconate) Soap before surgery.  CHG is an antiseptic cleaner which kills germs and bonds with the skin to continue killing germs even after washing.    Oral Hygiene is also important to reduce your risk of infection.  Remember - BRUSH YOUR TEETH THE MORNING OF SURGERY WITH YOUR REGULAR TOOTHPASTE  Please do not use if you have an allergy to CHG or antibacterial soaps. If your skin becomes reddened/irritated stop using the CHG.  Do not shave (including legs and underarms) for at  least 48 hours prior to first CHG shower. It is OK to shave your face.  Please follow these instructions carefully.   Shower the NIGHT BEFORE SURGERY and the MORNING OF SURGERY  If you chose to wash your hair, wash your hair first as usual with your normal shampoo.  After you shampoo, rinse your hair and body thoroughly to remove the shampoo.  Use CHG Soap as you would any other liquid soap. You can apply CHG directly to the skin and wash gently with a scrungie or a clean washcloth.   Apply the CHG Soap to your  body ONLY FROM THE NECK DOWN.  Do not use on open wounds or open sores. Avoid contact with your eyes, ears, mouth and genitals (private parts). Wash Face and genitals (private parts)  with your normal soap.   Wash thoroughly, paying special attention to the area where your surgery will be performed.  Thoroughly rinse your body with warm water from the neck down.  DO NOT shower/wash with your normal soap after using and rinsing off the CHG Soap.  Pat yourself dry with a CLEAN TOWEL.  Wear CLEAN PAJAMAS to bed the night before surgery  Place CLEAN SHEETS on your bed the night before your surgery  DO NOT SLEEP WITH PETS.   Day of Surgery: Take a shower with CHG soap. Do not wear jewelry or makeup Do not wear lotions, powders, perfumes/colognes, or deodorant. Do not shave 48 hours prior to surgery. Do not bring valuables to the hospital.  [facility] is not responsible for any belongings or valuables. Do not wear nail polish, gel polish, artificial nails, or any other type of covering on natural nails (fingers and toes) If you have artificial nails or gel coating that need to be removed by a nail salon, please have this removed prior to surgery. Artificial nails or gel coating may interfere with anesthesia's ability to adequately monitor your vital signs. Wear Clean/Comfortable clothing the morning of surgery Do not apply any deodorants/lotions.   Remember to brush your teeth WITH YOUR REGULAR TOOTHPASTE.   Please read over the following fact sheets that you were given.  If you received a COVID test during your pre-op visit  it is requested that you wear a mask when out in public, stay away from anyone that may not be feeling well and notify your surgeon if you develop symptoms. If you have been in contact with anyone that has tested positive in the last 10 days please notify you surgeon.

## 2022-06-30 NOTE — Progress Notes (Addendum)
Anesthesia Chart Review:  Case: [id number] Date/Time: 07/09/22 1151   Procedure: SHUNT INSERTION VENTRICULAR-PERITONEAL (Right)   Anesthesia type: General   Pre-op diagnosis: COMMUNICATING HYDROCEPHALUS   Location: [facility] ROOM 21 / [facility]   Surgeons: Newman Pies, MD       DISCUSSION: [name] is an 86 year old male scheduled for the above procedure. His daughter is one of our Surgical Short Stay nurses.   History includes former smoker (quit 09/21/84), post-operative N/V, HTN, HLD, CAD (s/p DES CX & DES distal RCA 08/05/05), afib (diagnosed 10/06/18), carotid artery disease (stable 1-39% BICA 04/2020), venous insufficiency, hypothyroidism, thyroid nodules, RUE tremor, BPH, melanoma, osteoarthritis (left THA 09/15/06; right THA 01/01/10; right TKA 06/25/14), spinal surgery (L2-5 laminectomy, left L5-S1 discectomy 07/13/16).   Last PCP visit with Dr. Larose Kells was on 05/28/22. He noted plans for VP shunt for management of hydrocephalus, as patient had improvement in gait and alertness after large volume LP. A1c and BP stable.   Last office visit with Dr. Johnsie Cancel was on 01/06/22. He also had preoperative cardiology telephonic evaluation on 04/24/22 by Ambrose Pancoast, NP. He wrote, "Mr. Whitmyer's perioperative risk of a major cardiac event is 0.9% according to the Revised Cardiac Risk Index (RCRI).  Therefore, he is at low risk for perioperative complications.   His functional capacity is fair at 4.06 METs according to the Duke Activity Status Index (DASI). Recommendations: According to ACC/AHA guidelines, no further cardiovascular testing needed.  The patient may proceed to surgery at acceptable risk.     Antiplatelet and/or Anticoagulation Recommendations: Prefer ASA to be continued unless surgeon feels bleeding risk is too elevated.  Could be held 7 days prior to procedure if needed. Eliquis (Apixaban) can be held for 2-3 days prior to surgery.  Please resume post op when felt to be safe."  Nikki from Dr.  Arnoldo Morale' office confirmed she had clarified with patient's wife to hold Eliquis for 3 days prior to surgery and hold ASA 5 days prior to surgery.   I called and spoke with patient, his wife, and daughter [name] on 07/01/22 around 5:10 PM. HR was 45 bpm at PAT. His baseline appears to be around low 60's bpm. He reported he was asymptomatic without dizziness or syncope. He cannot tell when he is or is not in afib. They have a wrist automatic BP cuff at home which measured his BP at 99/67 and HR of 49. Medications that could affect BP and/or HR include Lasix 60 mg daily, Lopressor 12.5 mg BID, diltiazem 180 mg daily, benazepril 30 mg daily. They did not think he had taken Lasix on 07/01/22 due to going to his PAT visit. He takes his evening dose of Lopressor at 9:00 PM. I advised that he check his BP and HR prior to taking his 07/01/22 PM dose, and if his HR was still in the 40's would recommend to hold that dose and contact the prescriber on 07/02/22 AM for further recommendations. I received an update from his daughter [name] on 07/02/22 AM indicating that is pm dose of Lopressor had been held because his BP was 95/54 and HR 45, and BP 147/85, HR 65 around 9:00 AM 07/02/22. His wife reached out to Dr. Larose Kells. He noted Mr. Skipper's BP and HR episodically low, but is decreasing metoprolol dose and changing to metoprolol extended release 25 mg: Half tablet once daily. He is also having him monitor his BP and HR over the next few weeks.    He will get vitals  on arrival. Anesthesia team to evaluate on the day of surgery.    VS: BP 120/62   Pulse (!) 45   Temp 36.6 C (Oral)   Resp 18   Ht '5\' 8"'$  (1.727 m)   Wt 89.3 kg   SpO2 97%   BMI 29.92 kg/m  Pulse Readings from Last 3 Encounters:  07/01/22 (!) 45  05/28/22 61  02/27/22 64    PROVIDERS: Colon Branch, MD is PCP  Jenkins Rouge, MD is cardiologist Arlice Colt, MD is neurologist Servando Snare, MD is vascular surgeon Fredirick Maudlin, MD is general  surgery (for LLE wound care)    LABS: Labs reviewed: Acceptable for surgery. A1c 6.1% on 11/10/21. TSH 4.79 on 05/28/22.  (all labs ordered are listed, but only abnormal results are displayed)  Labs Reviewed  BASIC METABOLIC PANEL - Abnormal; Notable for the following components:      Result Value   GFR, Estimated 60 (*)    Anion gap 4 (*)    All other components within normal limits  CBC - Abnormal; Notable for the following components:   RDW 15.9 (*)    All other components within normal limits    IMAGES: MRI Brain 02/15/22: IMPRESSION:  1.   Ventriculomegaly out of proportion to the extent of cortical atrophy.  Sulci are effaced at the vertex.  This pattern is consistent with communicating (normal pressure) hydrocephalus.  There has been mild progression compared to the 2019 MRI. 2.   Minimal chronic microvascular ischemic changes, typical for age. 3.   No acute findings.   EKG: 01/06/22 ([facility]): A-flutter at 65 bpm. LAD.    CV: US Carotid 05/07/20: Summary:  - Right Carotid: Velocities in the right ICA are consistent with a 1-39% stenosis. Non-hemodynamically significant plaque <50% noted in the CCA. Stable ICA stenosis.  - Left Carotid: Velocities in the left ICA are consistent with a 1-39% stenosis. Non-hemodynamically significant plaque <50% noted in the CCA. Stable ICA stenosis.  - Vertebrals:  Bilateral vertebral arteries demonstrate antegrade flow.  - Subclavians: Normal flow hemodynamics were seen in bilateral subclavian arteries.    Echo 10/13/18: Study Conclusions  - Left ventricle: The cavity size was normal. There was mild    concentric hypertrophy. Systolic function was normal. The    estimated ejection fraction was in the range of 55% to 60%. Wall    motion was normal; there were no regional wall motion    abnormalities.  - Left atrium: The atrium was severely dilated.  - Right ventricle: The cavity size was moderately dilated.  - Right atrium: The  atrium was severely dilated.  - Atrial septum: No defect or patent foramen ovale was identified.  - Pulmonary arteries: Systolic pressure was mildly to moderately    increased. PA peak pressure: 46 mm Hg (S).    Nuclear stress test 03/31/16: Nuclear stress EF: 62%. Normal LV function There was no ST segment deviation noted during stress. The study is normal. There is no evidence of ischemia or previous infarction. This is a low risk study.     Cardiac cath 08/05/15:  - Left main coronary artery was normal. - Left anterior descending artery was heavily calcified. - The proximal vessel had 30-40% tubular disease.  The mid vessel had 50%  tubular disease.  First diagonal branch had 40% ostial disease. - The circumflex coronary artery was nondominant. - The proximal circumflex coronary artery was also calcified.  There was 30% tubular disease.  The mid vessel  at the takeoff of a large obtuse marginal branch had a 70-80% focal lesion. - The patient had left-to-right collaterals from the AV circumflex and septal perforators to the distal right coronary artery. - The right coronary artery had 40-50% tubular disease proximally at the ostium.  Mid vessel had 30% multidiscrete lesions.  The distal vessel prior to the takeoff of the PDA had an 80-90% lesion with initial TIMI 2 flow. - RAO ventriculography showed normal EF 55%.  There is minimal inferior basal wall hypokinesis with no MR.  Aortic pressure was 120/65, LV pressure is 120/12.   PCI 08/05/05: 1.  Drug-eluting stent placement in the mid-circumflex. 2.  Drug-eluting stent placement (x2) to the distal right coronary artery. COMPLICATIONS:  Ventricular fibrillation, treated promptly and successfully with two DC counter shocks.     Past Medical History:  Diagnosis Date   A-fib [facility])    Arthritis    BPH (benign prostatic hypertrophy)    Carotid artery disease (HCC)    Doppler, December 08, 2011, 00 97% R. ICA, 67-34% LICA, followup  1 year   Coronary artery disease    DES Circumflex or CVA 2006  /  clear, February, 2011, EF 70%, no ischemia or   Ejection fraction    EF 60%, echo, 2009, mildly calcified aortic leaflets   History of colonic polyps    Hyperlipidemia    Hypertension    Hypothyroidism    Lumbar radiculopathy    Multiple thyroid nodules    Avascular echogenic areas noted in the right thyroid at the time of carotid Doppler.    PONV (postoperative nausea and vomiting)    "nausea with first hip surgery"   Skin cancer (melanoma) ([place])    PMH of    Spinal stenosis, lumbar    Tremor    Fine tremor right upper extremity   Venous insufficiency     Past Surgical History:  Procedure Laterality Date   COLONOSCOPY     CORONARY ANGIOPLASTY WITH STENT PLACEMENT  2006   x 2 stents; DES to CX and dRCA '06   KNEE SURGERY  1970's   "chipped bone"   LUMBAR LAMINECTOMY/DECOMPRESSION MICRODISCECTOMY Left 07/13/2016   Procedure: LUMBAR LAMINECTOMY AND FORAMINOTOMY Lumbar two, three, Lumbar three-four, Lumbar four-five, LEFT Lumbar five-Sacral one DISECTOMY;  Surgeon: Newman Pies, MD;  Location: [facility];  Service: Neurosurgery;  Laterality: Left;   TOE SURGERY  1997   TONSILLECTOMY     TOTAL HIP ARTHROPLASTY Left 2007   TOTAL HIP ARTHROPLASTY Right 2010   TOTAL KNEE ARTHROPLASTY Right 06/25/2014   Procedure: RIGHT TOTAL KNEE ARTHROPLASTY;  Surgeon: Gearlean Alf, MD;  Location: [facility];  Service: Orthopedics;  Laterality: Right;    MEDICATIONS:  apixaban (ELIQUIS) 5 MG TABS tablet   aspirin 81 MG tablet   atorvastatin (LIPITOR) 40 MG tablet   benazepril (LOTENSIN) 20 MG tablet   diltiazem (DILACOR XR) 180 MG 24 hr capsule   furosemide (LASIX) 40 MG tablet   levothyroxine (SYNTHROID) 175 MCG tablet   LUMIGAN 0.01 % SOLN   metoprolol tartrate (LOPRESSOR) 25 MG tablet   nitroGLYCERIN (NITROSTAT) 0.4 MG SL tablet   Timolol Maleate 0.5 % (DAILY) SOLN   No current facility-administered medications for this  encounter.    Myra Gianotti, PA-C Surgical Short Stay/Anesthesiology [facility] Phone [phone] [facility] Phone [phone] 07/02/2022 4:46 PM

## 2022-07-01 ENCOUNTER — Encounter (HOSPITAL_COMMUNITY)
Admission: RE | Admit: 2022-07-01 | Discharge: 2022-07-01 | Disposition: A | Discharge status: Home / Self Care | Payer: Medicare Other | Source: Ambulatory Visit | Attending: Neurosurgery | Admitting: Neurosurgery

## 2022-07-01 ENCOUNTER — Encounter (HOSPITAL_COMMUNITY): Payer: Self-pay

## 2022-07-01 ENCOUNTER — Other Ambulatory Visit: Payer: Self-pay

## 2022-07-01 VITALS — BP 120/62 | HR 45 | Temp 97.9°F | Resp 18 | Ht 68.0 in | Wt 196.8 lb

## 2022-07-01 DIAGNOSIS — E041 Nontoxic single thyroid nodule: Secondary | ICD-10-CM | POA: Insufficient documentation

## 2022-07-01 DIAGNOSIS — I2510 Atherosclerotic heart disease of native coronary artery without angina pectoris: Secondary | ICD-10-CM | POA: Diagnosis not present

## 2022-07-01 DIAGNOSIS — Z85820 Personal history of malignant melanoma of skin: Secondary | ICD-10-CM | POA: Insufficient documentation

## 2022-07-01 DIAGNOSIS — I4891 Unspecified atrial fibrillation: Secondary | ICD-10-CM | POA: Diagnosis not present

## 2022-07-01 DIAGNOSIS — E039 Hypothyroidism, unspecified: Secondary | ICD-10-CM | POA: Diagnosis not present

## 2022-07-01 DIAGNOSIS — I10 Essential (primary) hypertension: Secondary | ICD-10-CM | POA: Diagnosis not present

## 2022-07-01 DIAGNOSIS — M1990 Unspecified osteoarthritis, unspecified site: Secondary | ICD-10-CM | POA: Diagnosis not present

## 2022-07-01 DIAGNOSIS — E785 Hyperlipidemia, unspecified: Secondary | ICD-10-CM | POA: Insufficient documentation

## 2022-07-01 DIAGNOSIS — N400 Enlarged prostate without lower urinary tract symptoms: Secondary | ICD-10-CM | POA: Diagnosis not present

## 2022-07-01 DIAGNOSIS — G910 Communicating hydrocephalus: Secondary | ICD-10-CM | POA: Diagnosis not present

## 2022-07-01 DIAGNOSIS — Z01812 Encounter for preprocedural laboratory examination: Secondary | ICD-10-CM | POA: Diagnosis not present

## 2022-07-01 DIAGNOSIS — Z87891 Personal history of nicotine dependence: Secondary | ICD-10-CM | POA: Diagnosis not present

## 2022-07-01 DIAGNOSIS — R251 Tremor, unspecified: Secondary | ICD-10-CM | POA: Diagnosis not present

## 2022-07-01 DIAGNOSIS — I872 Venous insufficiency (chronic) (peripheral): Secondary | ICD-10-CM | POA: Diagnosis not present

## 2022-07-01 HISTORY — DX: Unspecified atrial fibrillation: I48.91

## 2022-07-01 LAB — BASIC METABOLIC PANEL
Anion gap: 4 — ABNORMAL LOW (ref 5–15)
BUN: 23 mg/dL (ref 8–23)
CO2: 27 mmol/L (ref 22–32)
Calcium: 9 mg/dL (ref 8.9–10.3)
Chloride: 109 mmol/L (ref 98–111)
Creatinine, Ser: 1.2 mg/dL (ref 0.61–1.24)
GFR, Estimated: 60 mL/min — ABNORMAL LOW (ref >60)
Glucose, Bld: 98 mg/dL (ref 70–99)
Potassium: 4.6 mmol/L (ref 3.5–5.1)
Sodium: 140 mmol/L (ref 135–145)

## 2022-07-01 LAB — CBC
HCT: 48.1 % (ref 39.0–52.0)
Hemoglobin: 16.1 g/dL (ref 13.0–17.0)
MCH: 30.6 pg (ref 26.0–34.0)
MCHC: 33.5 g/dL (ref 30.0–36.0)
MCV: 91.3 fL (ref 80.0–100.0)
Platelets: 248 10*3/uL (ref 150–400)
RBC: 5.27 MIL/uL (ref 4.22–5.81)
RDW: 15.9 % — ABNORMAL HIGH (ref 11.5–15.5)
WBC: 6.3 10*3/uL (ref 4.0–10.5)
nRBC: 0 % (ref 0.0–0.2)

## 2022-07-01 NOTE — Progress Notes (Signed)
PCP - Dr. [name] Cardiologist - Dr. Jenkins Rouge  PPM/ICD - Denies Device Orders - n/a Rep Notified - n/a  Chest x-ray - n/a EKG - 12/27/2021 Stress Test - 03/31/2016 ECHO - 10/13/2018 Cardiac Cath - 08/04/2005  Sleep Study - Denies CPAP - n/a  No DM  Last dose of GLP1 agonist- n/a GLP1 instructions: n/a  Blood Thinner Instructions: Per Dr. Arnoldo Morale order, pt is to stop Eliquis 3 days prior to surgery, which would make last dose October 15th. Pts wife said that his office instructed her to stop 5 days prior to surgery. Instructed pt and pts wife to call Dr. Arnoldo Morale office to verify how long to hold Eliquis.  Aspirin Instructions: Per Cardiology recommendations, pt can hold ASA 7 days prior to surgery. Pts wife stated they have not yet received instructions on when to hold ASA. Instructed them to ask Dr. Arnoldo Morale office when to hold medication. If they agree with 7 days, last dose will be October 12th.  NPO after midnight  COVID TEST- n/a   Anesthesia review: Yes. Cardiac Clearance note 04/24/2022  Patient denies shortness of breath, fever, cough and chest pain at PAT appointment   All instructions explained to the patient, with a verbal understanding of the material. Patient agrees to go over the instructions while at home for a better understanding. Patient also instructed to self quarantine after being tested for COVID-19. The opportunity to ask questions was provided.

## 2022-07-01 NOTE — Pre-Procedure Instructions (Signed)
Surgical Instructions    Your procedure is scheduled on Thursday, July 09, 2022 at 12:06 PM.  Report to [facility] Main Entrance "A" at 10:00 A.M., then check in with the Admitting office.  Call this number if you have problems the morning of surgery:  [phone]   If you have any questions prior to your surgery date call [phone]: Open Monday-Friday 8am-4pm  *If you experience any cold or flu symptoms such as cough, fever, chills, shortness of breath, etc. between now and your scheduled surgery, please notify us.*    Remember:  Do not eat or drink after midnight the night before your surgery      Take these medicines the morning of surgery with A SIP OF WATER: diltiazem (DILACOR XR) levothyroxine (SYNTHROID)  metoprolol tartrate (LOPRESSOR) Timolol Maleate 0.5 % (DAILY) SOLN nitroGLYCERIN (NITROSTAT) - if needed    Per Dr. Arnoldo Morale, STOP taking Eliquis three days prior to surgery. Your last dose will be October 15th. Verify these instructions with his office.  Follow your surgeon's instructions on when to stop Aspirin. Per your cardiologist, you can stop your Aspirin 7 days prior to your surgery. Your last dose will be October 12th.   As of today, STOP taking any Aleve, Naproxen, Ibuprofen, Motrin, Advil, Goody's, BC's, all herbal medications, fish oil, and all vitamins.                     Do NOT Smoke (Tobacco/Vaping) for 24 hours prior to your procedure.  If you use a CPAP at night, you may bring your mask/headgear for your overnight stay.   Contacts, glasses, piercing's, hearing aid's, dentures or partials may not be worn into surgery, please bring cases for these belongings.    For patients admitted to the hospital, discharge time will be determined by your treatment team.   Patients discharged the day of surgery will not be allowed to drive home, and someone needs to stay with them for 24 hours.  SURGICAL WAITING ROOM VISITATION Patients having surgery  or a procedure may have two support people in the waiting area. Visitors may stay in the waiting area during the procedure and switch out with other visitors if needed. Children under the age of 65 must have an adult accompany them who is not the patient. If the patient needs to stay at the hospital during part of their recovery, the visitor guidelines for inpatient rooms apply.  Please refer to the [facility] website for the visitor guidelines for Inpatients (after your surgery is over and you are in a regular room).    Special instructions:   [place]- Preparing For Surgery  Before surgery, you can play an important role. Because skin is not sterile, your skin needs to be as free of germs as possible. You can reduce the number of germs on your skin by washing with CHG (chlorahexidine gluconate) Soap before surgery.  CHG is an antiseptic cleaner which kills germs and bonds with the skin to continue killing germs even after washing.    Oral Hygiene is also important to reduce your risk of infection.  Remember - BRUSH YOUR TEETH THE MORNING OF SURGERY WITH YOUR REGULAR TOOTHPASTE  Please do not use if you have an allergy to CHG or antibacterial soaps. If your skin becomes reddened/irritated stop using the CHG.  Do not shave (including legs and underarms) for at least 48 hours prior to first CHG shower. It is OK to shave your face.  Please  follow these instructions carefully.   Shower the NIGHT BEFORE SURGERY and the MORNING OF SURGERY  If you chose to wash your hair, wash your hair first as usual with your normal shampoo.  After you shampoo, rinse your hair and body thoroughly to remove the shampoo.  Use CHG Soap as you would any other liquid soap. You can apply CHG directly to the skin and wash gently with a scrungie or a clean washcloth.   Apply the CHG Soap to your body ONLY FROM THE NECK DOWN.  Do not use on open wounds or open sores. Avoid contact with your eyes, ears, mouth and  genitals (private parts). Wash Face and genitals (private parts)  with your normal soap.   Wash thoroughly, paying special attention to the area where your surgery will be performed.  Thoroughly rinse your body with warm water from the neck down.  DO NOT shower/wash with your normal soap after using and rinsing off the CHG Soap.  Pat yourself dry with a CLEAN TOWEL.  Wear CLEAN PAJAMAS to bed the night before surgery  Place CLEAN SHEETS on your bed the night before your surgery  DO NOT SLEEP WITH PETS.   Day of Surgery: Take a shower with CHG soap. Do not wear jewelry or makeup Do not wear lotions, powders, perfumes/colognes, or deodorant. Do not shave 48 hours prior to surgery. Do not bring valuables to the hospital.  [facility] is not responsible for any belongings or valuables. Do not wear nail polish, gel polish, artificial nails, or any other type of covering on natural nails (fingers and toes) If you have artificial nails or gel coating that need to be removed by a nail salon, please have this removed prior to surgery. Artificial nails or gel coating may interfere with anesthesia's ability to adequately monitor your vital signs.  Wear Clean/Comfortable clothing the morning of surgery Do not apply any deodorants/lotions.   Remember to brush your teeth WITH YOUR REGULAR TOOTHPASTE.   Please read over the following fact sheets that you were given.  If you received a COVID test during your pre-op visit  it is requested that you wear a mask when out in public, stay away from anyone that may not be feeling well and notify your surgeon if you develop symptoms. If you have been in contact with anyone that has tested positive in the last 10 days please notify you surgeon.

## 2022-07-02 ENCOUNTER — Other Ambulatory Visit: Payer: Self-pay | Admitting: Internal Medicine

## 2022-07-02 ENCOUNTER — Telehealth: Payer: Self-pay | Admitting: Internal Medicine

## 2022-07-02 MED ORDER — METOPROLOL SUCCINATE ER 25 MG PO TB24: 12.5000 mg | ORAL_TABLET | Freq: Every day | ORAL | 0 refills | Status: DC

## 2022-07-02 NOTE — Telephone Encounter (Signed)
Please advise 

## 2022-07-02 NOTE — Telephone Encounter (Signed)
Pt's wife called and stated when they went to the surgery center his heart rate was in the 40s and they were very concerned. She did not give him his second dose of metoprolol last night and heart rate is back up to 65. She requested to speak with a nurse about medication. Kaylyn made aware and stated she would call them back.

## 2022-07-02 NOTE — Telephone Encounter (Addendum)
Received message from Myra Gianotti, PA from pre-anesthesia testing. She wanted to report the information below.   07/01/22 1:54 PM: BP 120/62, HR 45 (PAT)  07/01/22 5:10 PM: BP 99/67, HR 49 (by home wrist cuff)  07/01/22 ~ 9:00 PM: BP 95/54, HR 45 (wrist cuff).  He held his 9 PM dose of Lopressor 12.5 mg. 07/12/22 ~ 9:00 AM: BP 147/85, HR 65  Denied symptomatic bradycardia.

## 2022-07-02 NOTE — Telephone Encounter (Signed)
Spoke w/ Pt's Thayer Headings- informed of recommendations. Thayer Headings verbalized understanding.

## 2022-07-02 NOTE — Telephone Encounter (Signed)
BP and heart rate episodically low. Needs a lower dose of metoprolol. I sent a prescription for metoprolol extended release 25 mg: Half tablet once daily. Continue checking BPs and heart rate.  Let me know readings in 2 weeks.

## 2022-07-02 NOTE — Anesthesia Preprocedure Evaluation (Addendum)
Anesthesia Evaluation  Patient identified by MRN, date of birth, ID band Patient awake    Reviewed: Allergy & Precautions, NPO status , Patient's Chart, lab work & pertinent test results  Airway Mallampati: III  TM Distance: >3 FB     Dental  (+) Dental Advisory Given   Pulmonary sleep apnea , former smoker,    breath sounds clear to auscultation       Cardiovascular hypertension, Pt. on medications and Pt. on home beta blockers + CAD   Rhythm:Regular Rate:Normal     Neuro/Psych  Neuromuscular disease    GI/Hepatic negative GI ROS, Neg liver ROS,   Endo/Other  Hypothyroidism   Renal/GU negative Renal ROS     Musculoskeletal  (+) Arthritis ,   Abdominal   Peds  Hematology negative hematology ROS (+)   Anesthesia Other Findings   Reproductive/Obstetrics                            Anesthesia Physical Anesthesia Plan  ASA: 3  Anesthesia Plan: General   Post-op Pain Management: Tylenol PO (pre-op)*   Induction: Intravenous  PONV Risk Score and Plan: 2 and Dexamethasone, Ondansetron and Treatment may vary due to age or medical condition  Airway Management Planned: Oral ETT  Additional Equipment:   Intra-op Plan:   Post-operative Plan: Extubation in OR  Informed Consent: I have reviewed the patients History and Physical, chart, labs and discussed the procedure including the risks, benefits and alternatives for the proposed anesthesia with the patient or authorized representative who has indicated his/her understanding and acceptance.     Dental advisory given  Plan Discussed with: CRNA  Anesthesia Plan Comments: (PAT note written 07/02/2022 by Myra Gianotti, PA-C. )       Anesthesia Quick Evaluation

## 2022-07-09 ENCOUNTER — Other Ambulatory Visit: Payer: Self-pay

## 2022-07-09 ENCOUNTER — Encounter (HOSPITAL_COMMUNITY): Payer: Self-pay | Admitting: Neurosurgery

## 2022-07-09 ENCOUNTER — Encounter (HOSPITAL_COMMUNITY)
Admission: RE | Disposition: A | Discharge status: Home / Self Care | Payer: Self-pay | Source: Home / Self Care | Attending: Neurosurgery

## 2022-07-09 ENCOUNTER — Inpatient Hospital Stay (HOSPITAL_COMMUNITY)
Admission: RE | Admit: 2022-07-09 | Discharge: 2022-07-10 | DRG: 033 | Disposition: A | Discharge status: Home / Self Care | Payer: Medicare Other | Attending: Neurosurgery | Admitting: Neurosurgery

## 2022-07-09 ENCOUNTER — Inpatient Hospital Stay (HOSPITAL_COMMUNITY): Payer: Medicare Other | Admitting: Vascular Surgery

## 2022-07-09 ENCOUNTER — Inpatient Hospital Stay (HOSPITAL_COMMUNITY): Payer: Medicare Other | Admitting: Certified Registered"

## 2022-07-09 DIAGNOSIS — Z79899 Other long term (current) drug therapy: Secondary | ICD-10-CM | POA: Diagnosis not present

## 2022-07-09 DIAGNOSIS — G919 Hydrocephalus, unspecified: Secondary | ICD-10-CM | POA: Diagnosis not present

## 2022-07-09 DIAGNOSIS — Z87891 Personal history of nicotine dependence: Secondary | ICD-10-CM

## 2022-07-09 DIAGNOSIS — G912 (Idiopathic) normal pressure hydrocephalus: Secondary | ICD-10-CM

## 2022-07-09 DIAGNOSIS — I2510 Atherosclerotic heart disease of native coronary artery without angina pectoris: Secondary | ICD-10-CM

## 2022-07-09 DIAGNOSIS — M1990 Unspecified osteoarthritis, unspecified site: Secondary | ICD-10-CM | POA: Diagnosis present

## 2022-07-09 DIAGNOSIS — E785 Hyperlipidemia, unspecified: Secondary | ICD-10-CM | POA: Diagnosis not present

## 2022-07-09 DIAGNOSIS — Z86010 Personal history of colonic polyps: Secondary | ICD-10-CM

## 2022-07-09 DIAGNOSIS — Z955 Presence of coronary angioplasty implant and graft: Secondary | ICD-10-CM

## 2022-07-09 DIAGNOSIS — I10 Essential (primary) hypertension: Secondary | ICD-10-CM | POA: Diagnosis present

## 2022-07-09 DIAGNOSIS — Z85820 Personal history of malignant melanoma of skin: Secondary | ICD-10-CM

## 2022-07-09 DIAGNOSIS — Z8673 Personal history of transient ischemic attack (TIA), and cerebral infarction without residual deficits: Secondary | ICD-10-CM

## 2022-07-09 DIAGNOSIS — E039 Hypothyroidism, unspecified: Secondary | ICD-10-CM | POA: Diagnosis not present

## 2022-07-09 DIAGNOSIS — Z888 Allergy status to other drugs, medicaments and biological substances status: Secondary | ICD-10-CM | POA: Diagnosis not present

## 2022-07-09 DIAGNOSIS — Z79890 Hormone replacement therapy (postmenopausal): Secondary | ICD-10-CM

## 2022-07-09 DIAGNOSIS — J342 Deviated nasal septum: Secondary | ICD-10-CM | POA: Diagnosis not present

## 2022-07-09 DIAGNOSIS — N400 Enlarged prostate without lower urinary tract symptoms: Secondary | ICD-10-CM | POA: Diagnosis present

## 2022-07-09 DIAGNOSIS — Z96651 Presence of right artificial knee joint: Secondary | ICD-10-CM | POA: Diagnosis present

## 2022-07-09 DIAGNOSIS — Z7901 Long term (current) use of anticoagulants: Secondary | ICD-10-CM | POA: Diagnosis not present

## 2022-07-09 DIAGNOSIS — I4891 Unspecified atrial fibrillation: Secondary | ICD-10-CM | POA: Diagnosis not present

## 2022-07-09 DIAGNOSIS — Z8249 Family history of ischemic heart disease and other diseases of the circulatory system: Secondary | ICD-10-CM | POA: Diagnosis not present

## 2022-07-09 DIAGNOSIS — Z7982 Long term (current) use of aspirin: Secondary | ICD-10-CM | POA: Diagnosis not present

## 2022-07-09 DIAGNOSIS — G910 Communicating hydrocephalus: Secondary | ICD-10-CM | POA: Diagnosis present

## 2022-07-09 DIAGNOSIS — E042 Nontoxic multinodular goiter: Secondary | ICD-10-CM | POA: Diagnosis not present

## 2022-07-09 DIAGNOSIS — Z96643 Presence of artificial hip joint, bilateral: Secondary | ICD-10-CM | POA: Diagnosis not present

## 2022-07-09 DIAGNOSIS — Z982 Presence of cerebrospinal fluid drainage device: Secondary | ICD-10-CM | POA: Diagnosis not present

## 2022-07-09 HISTORY — PX: VENTRICULOPERITONEAL SHUNT: SHX204

## 2022-07-09 SURGERY — SHUNT INSERTION VENTRICULAR-PERITONEAL
Anesthesia: General | Laterality: Right

## 2022-07-09 MED ORDER — BUPIVACAINE-EPINEPHRINE 0.5% -1:200000 IJ SOLN
INTRAMUSCULAR | Status: DC | PRN
  Administered 2022-07-09: 10 mL

## 2022-07-09 MED ORDER — DILTIAZEM HCL ER COATED BEADS 180 MG PO CP24
180.0000 mg | ORAL_CAPSULE | Freq: Every day | ORAL | Status: DC
  Administered 2022-07-10: 180 mg via ORAL
  Filled 2022-07-09: qty 1

## 2022-07-09 MED ORDER — CEFAZOLIN SODIUM-DEXTROSE 2-4 GM/100ML-% IV SOLN
2.0000 g | Freq: Three times a day (TID) | INTRAVENOUS | Status: AC
  Administered 2022-07-09 – 2022-07-10 (×2): 2 g via INTRAVENOUS
  Filled 2022-07-09 (×2): qty 100

## 2022-07-09 MED ORDER — CEFAZOLIN SODIUM-DEXTROSE 2-4 GM/100ML-% IV SOLN
2.0000 g | INTRAVENOUS | Status: AC
  Administered 2022-07-09: 2 g via INTRAVENOUS
  Filled 2022-07-09: qty 100

## 2022-07-09 MED ORDER — FENTANYL CITRATE (PF) 100 MCG/2ML IJ SOLN: 25.0000 ug | INTRAMUSCULAR | Status: DC | PRN

## 2022-07-09 MED ORDER — METOPROLOL SUCCINATE ER 25 MG PO TB24
12.5000 mg | ORAL_TABLET | Freq: Every day | ORAL | Status: DC
  Administered 2022-07-10: 12.5 mg via ORAL
  Filled 2022-07-09: qty 1

## 2022-07-09 MED ORDER — CHLORHEXIDINE GLUCONATE CLOTH 2 % EX PADS: 6.0000 | MEDICATED_PAD | Freq: Once | CUTANEOUS | Status: DC

## 2022-07-09 MED ORDER — ROCURONIUM BROMIDE 10 MG/ML (PF) SYRINGE
PREFILLED_SYRINGE | INTRAVENOUS | Status: AC
  Filled 2022-07-09: qty 10

## 2022-07-09 MED ORDER — MORPHINE SULFATE (PF) 2 MG/ML IV SOLN: 1.0000 mg | INTRAVENOUS | Status: DC | PRN

## 2022-07-09 MED ORDER — LATANOPROST 0.005 % OP SOLN
1.0000 [drp] | Freq: Every day | OPHTHALMIC | Status: DC
  Filled 2022-07-09: qty 2.5

## 2022-07-09 MED ORDER — PROPOFOL 10 MG/ML IV BOLUS
INTRAVENOUS | Status: DC | PRN
  Administered 2022-07-09: 130 mg via INTRAVENOUS

## 2022-07-09 MED ORDER — LEVOTHYROXINE SODIUM 75 MCG PO TABS
175.0000 ug | ORAL_TABLET | Freq: Every day | ORAL | Status: DC
  Administered 2022-07-10: 175 ug via ORAL
  Filled 2022-07-09: qty 1

## 2022-07-09 MED ORDER — FENTANYL CITRATE (PF) 250 MCG/5ML IJ SOLN
INTRAMUSCULAR | Status: DC | PRN
  Administered 2022-07-09 (×3): 50 ug via INTRAVENOUS

## 2022-07-09 MED ORDER — BUPIVACAINE-EPINEPHRINE (PF) 0.5% -1:200000 IJ SOLN
INTRAMUSCULAR | Status: AC
  Filled 2022-07-09: qty 30

## 2022-07-09 MED ORDER — BENAZEPRIL HCL 5 MG PO TABS
30.0000 mg | ORAL_TABLET | Freq: Every day | ORAL | Status: DC
  Filled 2022-07-09: qty 2

## 2022-07-09 MED ORDER — DEXAMETHASONE SODIUM PHOSPHATE 10 MG/ML IJ SOLN
INTRAMUSCULAR | Status: AC
  Filled 2022-07-09: qty 1

## 2022-07-09 MED ORDER — FENTANYL CITRATE (PF) 250 MCG/5ML IJ SOLN
INTRAMUSCULAR | Status: AC
  Filled 2022-07-09: qty 5

## 2022-07-09 MED ORDER — DOCUSATE SODIUM 100 MG PO CAPS
100.0000 mg | ORAL_CAPSULE | Freq: Two times a day (BID) | ORAL | Status: DC
  Administered 2022-07-09 – 2022-07-10 (×2): 100 mg via ORAL
  Filled 2022-07-09 (×2): qty 1

## 2022-07-09 MED ORDER — EPHEDRINE 5 MG/ML INJ
INTRAVENOUS | Status: AC
  Filled 2022-07-09: qty 5

## 2022-07-09 MED ORDER — ONDANSETRON HCL 4 MG PO TABS: 4.0000 mg | ORAL_TABLET | ORAL | Status: DC | PRN

## 2022-07-09 MED ORDER — ATORVASTATIN CALCIUM 40 MG PO TABS
60.0000 mg | ORAL_TABLET | Freq: Every day | ORAL | Status: DC
  Administered 2022-07-09: 60 mg via ORAL
  Filled 2022-07-09: qty 2

## 2022-07-09 MED ORDER — PHENYLEPHRINE 80 MCG/ML (10ML) SYRINGE FOR IV PUSH (FOR BLOOD PRESSURE SUPPORT)
PREFILLED_SYRINGE | INTRAVENOUS | Status: DC | PRN
  Administered 2022-07-09: 80 ug via INTRAVENOUS

## 2022-07-09 MED ORDER — ONDANSETRON HCL 4 MG/2ML IJ SOLN
INTRAMUSCULAR | Status: AC
  Filled 2022-07-09: qty 2

## 2022-07-09 MED ORDER — PHENYLEPHRINE 80 MCG/ML (10ML) SYRINGE FOR IV PUSH (FOR BLOOD PRESSURE SUPPORT)
PREFILLED_SYRINGE | INTRAVENOUS | Status: AC
  Filled 2022-07-09: qty 10

## 2022-07-09 MED ORDER — DEXAMETHASONE SODIUM PHOSPHATE 10 MG/ML IJ SOLN
INTRAMUSCULAR | Status: DC | PRN
  Administered 2022-07-09: 10 mg via INTRAVENOUS

## 2022-07-09 MED ORDER — LACTATED RINGERS IV SOLN: INTRAVENOUS | Status: DC

## 2022-07-09 MED ORDER — LABETALOL HCL 5 MG/ML IV SOLN: 10.0000 mg | INTRAVENOUS | Status: DC | PRN

## 2022-07-09 MED ORDER — ONDANSETRON HCL 4 MG/2ML IJ SOLN
INTRAMUSCULAR | Status: DC | PRN
  Administered 2022-07-09: 4 mg via INTRAVENOUS

## 2022-07-09 MED ORDER — POTASSIUM CHLORIDE IN NACL 20-0.9 MEQ/L-% IV SOLN
INTRAVENOUS | Status: DC
  Filled 2022-07-09: qty 1000

## 2022-07-09 MED ORDER — FUROSEMIDE 40 MG PO TABS
60.0000 mg | ORAL_TABLET | Freq: Every day | ORAL | Status: DC
  Filled 2022-07-09: qty 1

## 2022-07-09 MED ORDER — PROMETHAZINE HCL 25 MG PO TABS: 12.5000 mg | ORAL_TABLET | ORAL | Status: DC | PRN

## 2022-07-09 MED ORDER — ACETAMINOPHEN 650 MG RE SUPP: 650.0000 mg | RECTAL | Status: DC | PRN

## 2022-07-09 MED ORDER — LIDOCAINE 2% (20 MG/ML) 5 ML SYRINGE
INTRAMUSCULAR | Status: AC
  Filled 2022-07-09: qty 5

## 2022-07-09 MED ORDER — PROPOFOL 10 MG/ML IV BOLUS
INTRAVENOUS | Status: AC
  Filled 2022-07-09: qty 20

## 2022-07-09 MED ORDER — LIDOCAINE 2% (20 MG/ML) 5 ML SYRINGE
INTRAMUSCULAR | Status: DC | PRN
  Administered 2022-07-09: 100 mg via INTRAVENOUS

## 2022-07-09 MED ORDER — SUGAMMADEX SODIUM 200 MG/2ML IV SOLN
INTRAVENOUS | Status: DC | PRN
  Administered 2022-07-09: 200 mg via INTRAVENOUS

## 2022-07-09 MED ORDER — 0.9 % SODIUM CHLORIDE (POUR BTL) OPTIME
TOPICAL | Status: DC | PRN
  Administered 2022-07-09: 1000 mL

## 2022-07-09 MED ORDER — AMISULPRIDE (ANTIEMETIC) 5 MG/2ML IV SOLN: 10.0000 mg | Freq: Once | INTRAVENOUS | Status: DC | PRN

## 2022-07-09 MED ORDER — CHLORHEXIDINE GLUCONATE 0.12 % MT SOLN
15.0000 mL | Freq: Once | OROMUCOSAL | Status: AC
  Administered 2022-07-09: 15 mL via OROMUCOSAL
  Filled 2022-07-09: qty 15

## 2022-07-09 MED ORDER — THROMBIN 5000 UNITS EX SOLR
CUTANEOUS | Status: AC
  Filled 2022-07-09: qty 15000

## 2022-07-09 MED ORDER — ONDANSETRON HCL 4 MG/2ML IJ SOLN: 4.0000 mg | INTRAMUSCULAR | Status: DC | PRN

## 2022-07-09 MED ORDER — NITROGLYCERIN 0.4 MG SL SUBL: 0.4000 mg | SUBLINGUAL_TABLET | SUBLINGUAL | Status: DC | PRN

## 2022-07-09 MED ORDER — HYDROCODONE-ACETAMINOPHEN 5-325 MG PO TABS: 1.0000 | ORAL_TABLET | ORAL | Status: DC | PRN

## 2022-07-09 MED ORDER — TIMOLOL MALEATE 0.5 % OP SOLN
1.0000 [drp] | Freq: Two times a day (BID) | OPHTHALMIC | Status: DC
  Administered 2022-07-10: 1 [drp] via OPHTHALMIC
  Filled 2022-07-09: qty 5

## 2022-07-09 MED ORDER — ACETAMINOPHEN 325 MG PO TABS: 650.0000 mg | ORAL_TABLET | ORAL | Status: DC | PRN

## 2022-07-09 MED ORDER — ROCURONIUM BROMIDE 10 MG/ML (PF) SYRINGE
PREFILLED_SYRINGE | INTRAVENOUS | Status: DC | PRN
  Administered 2022-07-09: 40 mg via INTRAVENOUS
  Administered 2022-07-09: 60 mg via INTRAVENOUS

## 2022-07-09 MED ORDER — ORAL CARE MOUTH RINSE: 15.0000 mL | Freq: Once | OROMUCOSAL | Status: AC

## 2022-07-09 MED ORDER — PANTOPRAZOLE SODIUM 40 MG IV SOLR
40.0000 mg | Freq: Every day | INTRAVENOUS | Status: DC
  Administered 2022-07-09: 40 mg via INTRAVENOUS
  Filled 2022-07-09: qty 10

## 2022-07-09 MED ORDER — EPHEDRINE SULFATE-NACL 50-0.9 MG/10ML-% IV SOSY
PREFILLED_SYRINGE | INTRAVENOUS | Status: DC | PRN
  Administered 2022-07-09: 5 mg via INTRAVENOUS
  Administered 2022-07-09: 10 mg via INTRAVENOUS
  Administered 2022-07-09: 5 mg via INTRAVENOUS

## 2022-07-09 MED ORDER — THROMBIN 5000 UNITS EX SOLR: OROMUCOSAL | Status: DC | PRN

## 2022-07-09 SURGICAL SUPPLY — 50 items
APL SKNCLS STERI-STRIP NONHPOA (GAUZE/BANDAGES/DRESSINGS) ×1
BAG COUNTER SPONGE SURGICOUNT (BAG) ×1 IMPLANT
BAG SPNG CNTER NS LX DISP (BAG) ×1
BENZOIN TINCTURE PRP APPL 2/3 (GAUZE/BANDAGES/DRESSINGS) ×1 IMPLANT
BLADE CLIPPER SURG (BLADE) ×2 IMPLANT
BOOT SUTURE AID YELLOW STND (SUTURE) ×1 IMPLANT
BUR ACORN 6.0 PRECISION (BURR) ×1 IMPLANT
BUR PRECISION FLUTE 6.0 (BURR) IMPLANT
CANISTER SUCT 3000ML PPV (MISCELLANEOUS) ×1 IMPLANT
CLIP RANEY DISP (INSTRUMENTS) IMPLANT
DRAPE INCISE IOBAN 85X60 (DRAPES) ×1 IMPLANT
DRAPE ORTHO SPLIT 77X108 STRL (DRAPES) ×2
DRAPE SURG 17X23 STRL (DRAPES) ×6 IMPLANT
DRAPE SURG ORHT 6 SPLT 77X108 (DRAPES) ×2 IMPLANT
DRSG OPSITE POSTOP 4X6 (GAUZE/BANDAGES/DRESSINGS) IMPLANT
ELECT REM PT RETURN 9FT ADLT (ELECTROSURGICAL) ×1
ELECTRODE REM PT RTRN 9FT ADLT (ELECTROSURGICAL) ×1 IMPLANT
GAUZE 4X4 16PLY ~~LOC~~+RFID DBL (SPONGE) IMPLANT
GLOVE BIO SURGEON STRL SZ8 (GLOVE) ×1 IMPLANT
GLOVE BIO SURGEON STRL SZ8.5 (GLOVE) ×1 IMPLANT
GOWN STRL REUS W/ TWL LRG LVL3 (GOWN DISPOSABLE) IMPLANT
GOWN STRL REUS W/ TWL XL LVL3 (GOWN DISPOSABLE) IMPLANT
GOWN STRL REUS W/TWL LRG LVL3 (GOWN DISPOSABLE)
GOWN STRL REUS W/TWL XL LVL3 (GOWN DISPOSABLE)
KIT BASIN OR (CUSTOM PROCEDURE TRAY) ×1 IMPLANT
KIT TURNOVER KIT B (KITS) ×1 IMPLANT
NEEDLE HYPO 22GX1.5 SAFETY (NEEDLE) ×1 IMPLANT
NS IRRIG 1000ML POUR BTL (IV SOLUTION) ×1 IMPLANT
PACK LAMINECTOMY NEURO (CUSTOM PROCEDURE TRAY) ×1 IMPLANT
PAD ARMBOARD 7.5X6 YLW CONV (MISCELLANEOUS) ×3 IMPLANT
SHEATH PERITONEAL INTRO 46 (SHEATH) IMPLANT
SHEATH PERITONEAL INTRO 61 (SHEATH) IMPLANT
SPONGE SURGIFOAM ABS GEL SZ50 (HEMOSTASIS) ×1 IMPLANT
SPONGE T-LAP 4X18 ~~LOC~~+RFID (SPONGE) IMPLANT
STAPLER SKIN PROX WIDE 3.9 (STAPLE) ×1 IMPLANT
STRIP CLOSURE SKIN 1/2X4 (GAUZE/BANDAGES/DRESSINGS) ×1 IMPLANT
SUT ETHILON 3 0 PS 1 (SUTURE) IMPLANT
SUT NURALON 4 0 TR CR/8 (SUTURE) IMPLANT
SUT SILK 0 TIES 10X30 (SUTURE) IMPLANT
SUT SILK 3 0 SH 30 (SUTURE) ×1 IMPLANT
SUT VIC AB 1 CT1 18XBRD ANBCTR (SUTURE) IMPLANT
SUT VIC AB 1 CT1 8-18 (SUTURE) ×1
SUT VIC AB 2-0 CP2 18 (SUTURE) ×1 IMPLANT
SUT VIC AB 3-0 SH 8-18 (SUTURE) ×1 IMPLANT
SYR CONTROL 10ML LL (SYRINGE) ×1 IMPLANT
TOWEL GREEN STERILE (TOWEL DISPOSABLE) ×1 IMPLANT
TOWEL GREEN STERILE FF (TOWEL DISPOSABLE) ×1 IMPLANT
UNDERPAD 30X36 HEAVY ABSORB (UNDERPADS AND DIAPERS) ×1 IMPLANT
VALVE RT ANGLE UNITIZE DIST (Valve) IMPLANT
WATER STERILE IRR 1000ML POUR (IV SOLUTION) ×1 IMPLANT

## 2022-07-09 NOTE — Transfer of Care (Signed)
Immediate Anesthesia Transfer of Care Note  Patient: [name]  Procedure(s) Performed: SHUNT INSERTION VENTRICULAR-PERITONEAL (Right)  Patient Location: PACU  Anesthesia Type:General  Level of Consciousness: drowsy and patient cooperative  Airway & Oxygen Therapy: Patient Spontanous Breathing  Post-op Assessment: Report given to RN and Post -op Vital signs reviewed and stable  Post vital signs: Reviewed and stable  Last Vitals:  Vitals Value Taken Time  BP 136/93 07/09/22 1347  Temp    Pulse 57 07/09/22 1348  Resp    SpO2 87 % 07/09/22 1348  Vitals shown include unvalidated device data.  Last Pain:  Vitals:   07/09/22 1017  TempSrc:   PainSc: 0-No pain         Complications: No notable events documented.

## 2022-07-09 NOTE — Op Note (Signed)
Brief history: The patient is an 86 year old white male who has complained of memory problems, unsteady gait, and urinary incontinence consistent with the normal pressure hydrocephalus.  He was worked up with a brain MRI which demonstrated hydrocephalus.  I discussed the various treatment options.  He has decided to proceed with placement of a ventriculoperitoneal l shunt.  Preop diagnosis: Normal pressure hydrocephalus  Postop diagnosis: The same  Procedure: Placement of right retroauricular ventriculoperitoneal shunt) Codman programmable valve set at 80 mmHg)  Surgeon: Dr. Earle Gell  Assistant: Arnetha Massy, NP  Anesthesia: General tracheal  Epidural blood loss: Minimal  Specimens: None  Drains: None  Complications: None  Description of procedure: The patient was brought to the operating room by the anesthesia team.  General endotracheal anesthesia was induced.  The patient remained in the supine position.  His head was turned to the left exposing his right scalp.  His right scalp was shaved with clippers and this with his right neck right thorax and right abdomen was prepared with Betadine scrub and Betadine solution.  Sterile drapes were applied.  I then injected the area to be incised with Marcaine with epinephrine solution.  I made a right retroauricular incision and a right upper quadrant abdominal incision.  I used the cerebellar retractor for exposure at both incisions.  Began with the abdominal incision.  I used electrocautery to ligate the patient's right anterior rectus sheath.  We dissected through the rectus muscle and identified the posterior rectus sheath where we divided this with the Metzenbaum scissors.  We dissected deeper and found abundant preperitoneal fat.  We dissected the same identified the peritoneum.  We sized with the Metzenbaum scissors and entered into the peritoneal cavity.  We could clearly see the patient's liver intestines, etc.  We now turned our  attention to the scalp incision.  We used the forceps to create a right retroauricular subcutaneous cavity for the shunt valve.  Then around the shunt catheter from the scalp incision down to the abdominal incision.  We asked the peritoneal catheter from the scalp incision down to the abdominal incision.  I used a high-speed drill to create a right retroauricular bur hole.  I coagulated the exposed dura with electrocautery.  I then cannulated the patient's ventricular system on the first pass with a ventricular catheter.  I remove the stylette and threaded the catheter a bit deeper.  There was good flow of clear CSF.  We then connected the ventricular catheter to the Rickham reservoir and secured in place with a silk tie.  We pulled the slack out of the ventricular catheter and seated the shunt valve.  We secured the Rickham reservoir in place with a figure-of-eight Vicryl tie.  We then removed the retractor and reapproximated the patient's galea with interrupted 2-0 Vicryl suture.  We reapproximated the scalp skin with interrupted stainless steel staples.  There was good flow of CSF distally through the peritoneal catheter.  We then placed a peritoneal catheter into the peritoneum.  We then proximate the patient's posterior rectus sheath with interrupted 2-0 Vicryl suture.  The anterior rectus sheath with interrupted 2-0 Vicryl suture.  We then remove the retractors and reapproximated the patient's subcutaneous tissue with interrupted 2-0 Vicryl suture.  We reapproximated the skin with stainless steel staples.  The wounds were then coated with bacitracin ointment.  A sterile dressing was applied.  The drapes were removed.  By report all sponge, instrument, and needle counts were correct at the end of this  case.

## 2022-07-09 NOTE — Anesthesia Procedure Notes (Signed)
Procedure Name: Intubation Date/Time: 07/09/2022 12:16 PM  Performed by: Darletta Moll, CRNAPre-anesthesia Checklist: Patient identified, Emergency Drugs available, Suction available and Patient being monitored Patient Re-evaluated:Patient Re-evaluated prior to induction Oxygen Delivery Method: Circle System Utilized Preoxygenation: Pre-oxygenation with 100% oxygen Induction Type: IV induction Ventilation: Mask ventilation without difficulty Laryngoscope Size: Mac and 4 Grade View: Grade I Tube type: Oral Tube size: 7.5 mm Number of attempts: 1 Airway Equipment and Method: Stylet and Oral airway Placement Confirmation: ETT inserted through vocal cords under direct vision, positive ETCO2 and breath sounds checked- equal and bilateral Secured at: 23 cm Tube secured with: Tape Dental Injury: Teeth and Oropharynx as per pre-operative assessment and Injury to lip

## 2022-07-09 NOTE — H&P (Signed)
Subjective: The patient is an 86 year old white male who is complained of a unsteady gait memory difficulties and urinary incontinence consistent with normal pressure hydrocephalus.Marland Kitchen  He was worked up with a brain MRI which demonstrated brain atrophy and hydrocephalus.  I discussed the various treatment options with him.  He has decided proceed with placement of a ventriculoperitoneal shunt.  Past Medical History:  Diagnosis Date   A-fib [facility])    Arthritis    BPH (benign prostatic hypertrophy)    Carotid artery disease (HCC)    Doppler, December 08, 2011, 00 25% R. ICA, 85-27% LICA, followup 1 year   Coronary artery disease    DES Circumflex or CVA 2006  /  clear, February, 2011, EF 70%, no ischemia or   Ejection fraction    EF 60%, echo, 2009, mildly calcified aortic leaflets   History of colonic polyps    Hyperlipidemia    Hypertension    Hypothyroidism    Lumbar radiculopathy    Multiple thyroid nodules    Avascular echogenic areas noted in the right thyroid at the time of carotid Doppler.    PONV (postoperative nausea and vomiting)    "nausea with first hip surgery"   Skin cancer (melanoma) ([place])    PMH of    Spinal stenosis, lumbar    Tremor    Fine tremor right upper extremity   Venous insufficiency     Past Surgical History:  Procedure Laterality Date   COLONOSCOPY     CORONARY ANGIOPLASTY WITH STENT PLACEMENT  2006   x 2 stents; DES to CX and dRCA '06   KNEE SURGERY  1970's   "chipped bone"   LUMBAR LAMINECTOMY/DECOMPRESSION MICRODISCECTOMY Left 07/13/2016   Procedure: LUMBAR LAMINECTOMY AND FORAMINOTOMY Lumbar two, three, Lumbar three-four, Lumbar four-five, LEFT Lumbar five-Sacral one DISECTOMY;  Surgeon: Newman Pies, MD;  Location: [facility];  Service: Neurosurgery;  Laterality: Left;   TOE SURGERY  1997   TONSILLECTOMY     TOTAL HIP ARTHROPLASTY Left 2007   TOTAL HIP ARTHROPLASTY Right 2010   TOTAL KNEE ARTHROPLASTY Right 06/25/2014   Procedure: RIGHT TOTAL KNEE  ARTHROPLASTY;  Surgeon: Gearlean Alf, MD;  Location: [facility];  Service: Orthopedics;  Laterality: Right;    Allergies  Allergen Reactions   Norvasc [Amlodipine Besylate] Swelling    Social History   Tobacco Use   Smoking status: Former    Types: Cigarettes    Quit date: 09/21/1984    Years since quitting: 37.8   Smokeless tobacco: Never   Tobacco comments:    smoked 1958-1986, up to 1 ppd  Substance Use Topics   Alcohol use: Yes    Comment: socially on occasion    Family History  Problem Relation Age of Onset   Coronary artery disease Mother        carotid endarterectomy bilaterally   Colon cancer Father 82   Hypertension Father    Pancreatitis Father    Diabetes Sister    Diabetes Maternal Grandfather    Heart attack Maternal Grandfather 59   Heart attack Sister 85   Thyroid disease Neg Hx    Stroke Neg Hx    Prostate cancer Neg Hx    Prior to Admission medications   Medication Sig Start Date End Date Taking? Authorizing Provider  apixaban (ELIQUIS) 5 MG TABS tablet TAKE 1 TABLET BY MOUTH TWICE DAILY Patient taking differently: Take 5 mg by mouth 2 (two) times daily. 03/11/22  Yes Josue Hector, MD  ascorbic  acid (VITAMIN C) 500 MG tablet Take 500 mg by mouth daily.   Yes [provider]  aspirin 81 MG tablet Take 1 tablet (81 mg total) by mouth daily. Patient taking differently: Take 81 mg by mouth daily. bedtime 03/03/17  Yes Josue Hector, MD  atorvastatin (LIPITOR) 40 MG tablet TAKE 1 AND 1/2 TABLETS(60 MG) BY MOUTH AT BEDTIME Patient taking differently: Take 60 mg by mouth at bedtime. 05/04/22  Yes Paz, Alda Berthold, MD  benazepril (LOTENSIN) 20 MG tablet Take 1.5 tablets (30 mg total) by mouth daily. 07/02/22  Yes Paz, Alda Berthold, MD  diltiazem (DILACOR XR) 180 MG 24 hr capsule Take 1 capsule (180 mg total) by mouth daily. 07/02/22  Yes Paz, Alda Berthold, MD  furosemide (LASIX) 40 MG tablet TAKE 1 AND 1/2 TABLETS(60 MG) BY MOUTH TWICE DAILY Patient taking differently:  Take 60 mg by mouth daily. 12/29/21  Yes Josue Hector, MD  levothyroxine (SYNTHROID) 175 MCG tablet Take 1 tablet (175 mcg total) by mouth daily before breakfast. 07/02/22  Yes Paz, Jose E, MD  LUMIGAN 0.01 % SOLN Place 1 drop into both eyes at bedtime. 04/13/16  Yes [provider]  metoprolol succinate (TOPROL-XL) 25 MG 24 hr tablet Take 0.5 tablets (12.5 mg total) by mouth daily. 07/02/22  Yes Paz, Alda Berthold, MD  Timolol Maleate 0.5 % (DAILY) SOLN Apply 1 drop to eye in the morning and at bedtime. At breakfast and evening meals   Yes [provider]  triamcinolone cream (KENALOG) 0.1 % Apply 1 Application topically 2 (two) times daily. 06/20/22  Yes [provider]  nitroGLYCERIN (NITROSTAT) 0.4 MG SL tablet PLACE 1 TABLET UNDER THE TONGUE EVERY 5 MINUTES AS NEEDED FOR CHEST PAIN Patient taking differently: Place 0.4 mg under the tongue every 5 (five) minutes as needed for chest pain. 04/24/20   Josue Hector, MD     Review of Systems  Positive ROS: As above  All other systems have been reviewed and were otherwise negative with the exception of those mentioned in the HPI and as above.  Objective: Vital signs in last 24 hours: Temp:  [97.9 F (36.6 C)] 97.9 F (36.6 C) (10/19 1010) Pulse Rate:  [61] 61 (10/19 1010) Resp:  [17] 17 (10/19 1010) BP: (141)/(87) 141/87 (10/19 1010) SpO2:  [96 %] 96 % (10/19 1010) Weight:  [87.1 kg] 87.1 kg (10/19 1010) Estimated body mass index is 29.19 kg/m as calculated from the following:   Height as of this encounter: '5\' 8"'$  (1.727 m).   Weight as of this encounter: 87.1 kg.   General Appearance: Alert Head: Normocephalic, without obvious abnormality, atraumatic Eyes: PERRL, conjunctiva/corneas clear, EOM's intact,    Ears: Normal  Throat: Normal  Neck: Supple, Back: His lumbar incision is well-healed. Lungs: Clear to auscultation bilaterally, respirations unlabored Heart: Regular rate and rhythm, no murmur, rub or  gallop Abdomen: Soft, non-tender Extremities: Extremities normal, atraumatic, no cyanosis or edema Skin: unremarkable  NEUROLOGIC:   Mental status: alert and oriented,Motor Exam - grossly normal Sensory Exam - grossly normal Reflexes:  Coordination - grossly normal Gait -unsteady Balance - grossly normal Cranial Nerves: I: smell Not tested  II: visual acuity  OS: Normal  OD: Normal   II: visual fields Full to confrontation  II: pupils Equal, round, reactive to light  III,VII: ptosis None  III,IV,VI: extraocular muscles  Full ROM  V: mastication Normal  V: facial light touch sensation  Normal  V,VII: corneal reflex  Present  VII: facial muscle function - upper  Normal  VII: facial muscle function - lower Normal  VIII: hearing Not tested  IX: soft palate elevation  Normal  IX,X: gag reflex Present  XI: trapezius strength  5/5  XI: sternocleidomastoid strength 5/5  XI: neck flexion strength  5/5  XII: tongue strength  Normal    Data Review Lab Results  Component Value Date   WBC 6.3 07/01/2022   HGB 16.1 07/01/2022   HCT 48.1 07/01/2022   MCV 91.3 07/01/2022   PLT 248 07/01/2022   Lab Results  Component Value Date   NA 140 07/01/2022   K 4.6 07/01/2022   CL 109 07/01/2022   CO2 27 07/01/2022   BUN 23 07/01/2022   CREATININE 1.20 07/01/2022   GLUCOSE 98 07/01/2022   Lab Results  Component Value Date   INR 2.0 (A) 03/27/2021    Assessment/Plan: Normal pressure hydrocephalus: I have discussed the situation with the patient.  I reviewed his MRI scan with him and pointed out the abnormalities.  We have discussed the various treatment options including placement of a ventriculoperitoneal shunt.  I have described the surgery to him.  We have discussed the risk, benefits, alternatives, expected postop course, and likelihood of achieving our goals with surgery.  I have answered all his questions.  He has decided with placement of a ventriculoperitoneal  shunt.   Ophelia Charter 07/09/2022 11:15 AM

## 2022-07-09 NOTE — Anesthesia Postprocedure Evaluation (Signed)
Anesthesia Post Note  Patient: [name]  Procedure(s) Performed: SHUNT INSERTION VENTRICULAR-PERITONEAL (Right)     Patient location during evaluation: PACU Anesthesia Type: General Level of consciousness: awake and alert Pain management: pain level controlled Vital Signs Assessment: post-procedure vital signs reviewed and stable Respiratory status: spontaneous breathing, nonlabored ventilation, respiratory function stable and patient connected to nasal cannula oxygen Cardiovascular status: blood pressure returned to baseline and stable Postop Assessment: no apparent nausea or vomiting Anesthetic complications: no   No notable events documented.  Last Vitals:  Vitals:   07/09/22 1630 07/09/22 1700  BP: 120/87 126/75  Pulse: (!) 59 61  Resp: 16 19  Temp:    SpO2: 96% 95%    Last Pain:  Vitals:   07/09/22 1600  TempSrc:   PainSc: 0-No pain                 Tiajuana Amass

## 2022-07-09 NOTE — Progress Notes (Signed)
Pt eating supper, able to move all extremities and feed self.

## 2022-07-10 ENCOUNTER — Inpatient Hospital Stay (HOSPITAL_COMMUNITY): Payer: Medicare Other

## 2022-07-10 ENCOUNTER — Encounter (HOSPITAL_COMMUNITY): Payer: Self-pay | Admitting: Neurosurgery

## 2022-07-10 MED ORDER — DOCUSATE SODIUM 100 MG PO CAPS: 100.0000 mg | ORAL_CAPSULE | Freq: Two times a day (BID) | ORAL | 0 refills | Status: DC

## 2022-07-10 MED ORDER — HYDROCODONE-ACETAMINOPHEN 5-325 MG PO TABS: 1.0000 | ORAL_TABLET | ORAL | 0 refills | Status: DC | PRN

## 2022-07-10 NOTE — Discharge Summary (Addendum)
Physician Discharge Summary  Patient ID: [name] MRN: [id number] DOB/AGE: 01/15/1936 86 y.o.  Admit date: 07/09/2022 Discharge date: 07/10/2022  Admission Diagnoses: Communicating hydrocephalus, normal pressure hydrocephalus  Discharge Diagnoses: The same Principal Problem:   (Idiopathic) normal pressure hydrocephalus [facility])   Discharged Condition: good  Hospital Course: I placed a right ventriculoperitoneal shunt on the patient on 07/09/2022.  The surgery went well.  The patient's postoperative course was unremarkable.  On postoperative day #1 he was doing well and requested discharge to home.  He was given verbal and written discharge instructions.  All his questions were answered.  His follow-up head CT looked good on 07/10/2022.  Consults: PT, OT, care management Significant Diagnostic Studies: Head CT Treatments: Placement of right ventriculoperitoneal shunt (Codman Hakim programmable valve set at 80 mmHg) Discharge Exam: Blood pressure (!) 128/91, pulse 62, temperature 97.8 F (36.6 C), temperature source Oral, resp. rate 18, height '5\' 8"'$  (1.727 m), weight 89.8 kg, SpO2 92 %. Patient is alert and pleasant.  He looks well.  He is moving all 4 extremities well.  His dressings are clean and dry.  Disposition: Home  Discharge Instructions     Call MD for:  difficulty breathing, headache or visual disturbances   Complete by: As directed    Call MD for:  extreme fatigue   Complete by: As directed    Call MD for:  hives   Complete by: As directed    Call MD for:  persistant dizziness or light-headedness   Complete by: As directed    Call MD for:  persistant nausea and vomiting   Complete by: As directed    Call MD for:  redness, tenderness, or signs of infection (pain, swelling, redness, odor or green/yellow discharge around incision site)   Complete by: As directed    Call MD for:  severe uncontrolled pain   Complete by: As directed    Call MD for:  temperature  >100.4   Complete by: As directed    Diet - low sodium heart healthy   Complete by: As directed    Discharge instructions   Complete by: As directed    Call [phone] for a followup appointment. Take a stool softener while you are using pain medications.   Driving Restrictions   Complete by: As directed    Do not drive for 2 weeks.   Increase activity slowly   Complete by: As directed    Lifting restrictions   Complete by: As directed    Do not lift more than 5 pounds. No excessive bending or twisting.   May shower / Bathe   Complete by: As directed    Remove the dressing for 3 days after surgery.  You may shower, but leave the incision alone.   Remove dressing in 48 hours   Complete by: As directed       Allergies as of 07/10/2022       Reactions   Norvasc [amlodipine Besylate] Swelling        Medication List     TAKE these medications    ascorbic acid 500 MG tablet Commonly known as: VITAMIN C Take 500 mg by mouth daily.   aspirin 81 MG tablet Take 1 tablet (81 mg total) by mouth daily. What changed: additional instructions   atorvastatin 40 MG tablet Commonly known as: LIPITOR TAKE 1 AND 1/2 TABLETS(60 MG) BY MOUTH AT BEDTIME What changed: See the new instructions.   benazepril 20 MG tablet Commonly known as:  LOTENSIN Take 1.5 tablets (30 mg total) by mouth daily.   diltiazem 180 MG 24 hr capsule Commonly known as: DILACOR XR Take 1 capsule (180 mg total) by mouth daily.   docusate sodium 100 MG capsule Commonly known as: COLACE Take 1 capsule (100 mg total) by mouth 2 (two) times daily.   Eliquis 5 MG Tabs tablet Generic drug: apixaban TAKE 1 TABLET BY MOUTH TWICE DAILY What changed: how much to take   furosemide 40 MG tablet Commonly known as: LASIX TAKE 1 AND 1/2 TABLETS(60 MG) BY MOUTH TWICE DAILY What changed: See the new instructions.   HYDROcodone-acetaminophen 5-325 MG tablet Commonly known as: NORCO/VICODIN Take 1 tablet by mouth  every 4 (four) hours as needed for moderate pain.   levothyroxine 175 MCG tablet Commonly known as: SYNTHROID Take 1 tablet (175 mcg total) by mouth daily before breakfast.   Lumigan 0.01 % Soln Generic drug: bimatoprost Place 1 drop into both eyes at bedtime.   metoprolol succinate 25 MG 24 hr tablet Commonly known as: TOPROL-XL Take 0.5 tablets (12.5 mg total) by mouth daily.   nitroGLYCERIN 0.4 MG SL tablet Commonly known as: NITROSTAT PLACE 1 TABLET UNDER THE TONGUE EVERY 5 MINUTES AS NEEDED FOR CHEST PAIN What changed: See the new instructions.   Timolol Maleate (Once-Daily) 0.5 % Soln Apply 1 drop to eye in the morning and at bedtime. At breakfast and evening meals   triamcinolone cream 0.1 % Commonly known as: KENALOG Apply 1 Application topically 2 (two) times daily.         Signed: Ophelia Charter 07/10/2022, 6:46 AM

## 2022-07-14 ENCOUNTER — Telehealth: Payer: Self-pay | Admitting: Internal Medicine

## 2022-07-14 ENCOUNTER — Encounter: Payer: Self-pay | Admitting: *Deleted

## 2022-07-14 ENCOUNTER — Telehealth: Payer: Self-pay | Admitting: *Deleted

## 2022-07-14 NOTE — Telephone Encounter (Signed)
Please advise 

## 2022-07-14 NOTE — Patient Outreach (Signed)
  Care Coordination [facility] Note Transition Care Management Follow-up Telephone Call Date of discharge and from where: Friday, July 10, 2022- [place]; ventricular-peritoneal shunt insertion How have you been since you were released from the hospital? "I am doing great; my wife is taking great care of me and helping me with anything I need, but I am getting back to myself with each passing day.  Not having any problems or concerns" Any questions or concerns? No  Items Reviewed: Did the pt receive and understand the discharge instructions provided? Yes - reviewed extensively with patient's spouse/ caregiver Medications obtained and verified? Yes  reviewed extensively with patient's spouse/ caregiver Other? No  Any new allergies since your discharge? No  Dietary orders reviewed? Yes Do you have support at home? Yes  spouse provides assistance as/if needed/ indicated  Home Care and Equipment/Supplies: Were home health services ordered? no If so, what is the name of the agency? N/A  Has the agency set up a time to come to the patient's home? not applicable Were any new equipment or medical supplies ordered?  No What is the name of the medical supply agency? N/A Were you able to get the supplies/equipment? not applicable Do you have any questions related to the use of the equipment or supplies? No  Functional Questionnaire: (I = Independent and D = Dependent) ADLs: I  spouse provides assistance as/if needed/ indicated  Bathing/Dressing- I  spouse provides assistance as/if needed/ indicated  Meal Prep- D  Eating- I  Maintaining continence- I spouse provides assistance as/if needed/ indicated   Transferring/Ambulation- I  spouse provides assistance as/if needed/ indicated  Managing Meds- I spouse provides assistance as/if needed/ indicated   Follow up appointments reviewed:  PCP Hospital f/u appt confirmed? No  Scheduled to see - on - @ [facility] f/u appt confirmed? Yes   Scheduled to see neurosurgeon Dr. Arnoldo Morale on Tuesday 07/21/22 @ 11:45 am Are transportation arrangements needed? No  If their condition worsens, is the pt aware to call PCP or go to the Emergency Dept.? Yes Was the patient provided with contact information for the PCP's office or ED? Yes Was to pt encouraged to call back with questions or concerns? Yes  SDOH assessments and interventions completed:   Yes  Care Coordination Interventions Activated:  Yes   Care Coordination Interventions:  Extensively reviewed AVS and medications with patient's spouse; advised spouse to follow current instructions for resuming ACT post-recent surgery and for monitoring blood pressure and heart rate prior to administering metoprolol, per PCP instructions; provided general education around side effects of diuretic therapy, advised spouse to continue giving diuretic as instructed until and "if" changes are made by care providers; discussed strategies to try at home for urgency associated with diuretic therapy    Encounter Outcome:  Pt. Visit Completed    Oneta Rack, RN, BSN, CCRN Alumnus RN CM Care Coordination/ Transition of [facility] Management [phone]: direct office

## 2022-07-14 NOTE — Telephone Encounter (Signed)
Patient's wife called to get help with the metoprolol. Dr. Larose Kells decreased dose down to 25 mg, half a tablet. Wife, Thayer Headings, is not sure when to give him the medication. She said his heart rate is 65 today, 53 last night and 64 yesterday. She asked does she give it to him when it's over 55 and don't give it to him when it's under 45. Please call to provide clarity on how to administer medication.

## 2022-07-14 NOTE — Telephone Encounter (Addendum)
Spoke w/ Pt's wife Thayer Headings, informed of recommendations. Thayer Headings verbalized understanding.

## 2022-07-14 NOTE — Telephone Encounter (Signed)
Was recommended to take metoprolol XL 25 mg half tablet a day. Okay to give it in the morning. As long as the heart rate is not below 55 okay to proceed.

## 2022-08-09 IMAGING — CT CT HEAD W/O CM
3 of 4 series · 15 of 47 positions shown, 18 images · non-contrast
Comparison: MRI 05/14/2018.

CLINICAL DATA: Abnormal gait concern for normal pressure
hydrocephalus.



[Series 3: head 2.0 hr68 · axial · 0.39mm/px · z∈[+128,+236]mm · 9 of 68 slices shown, 12 images]
[im 7/68  brain]
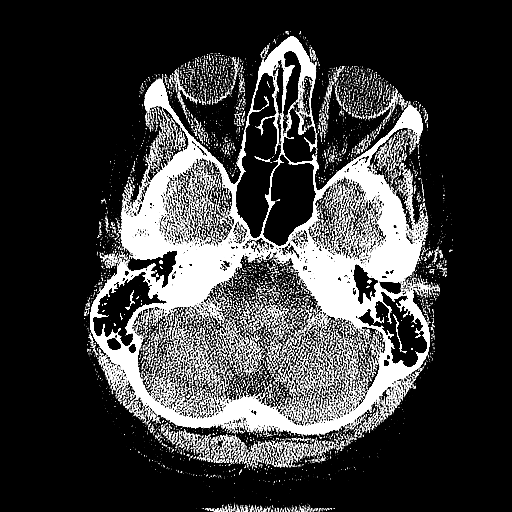
[im 7/68  bone]
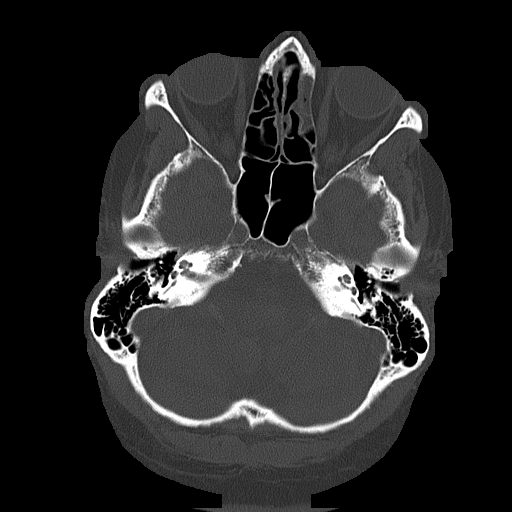
[im 14/68  brain]
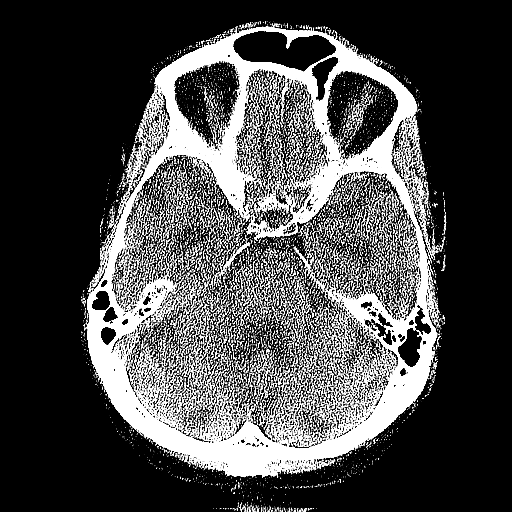
[im 21/68  brain]
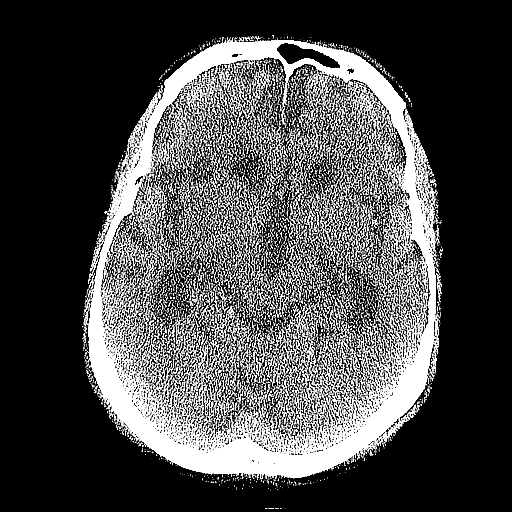
[im 27/68  brain]
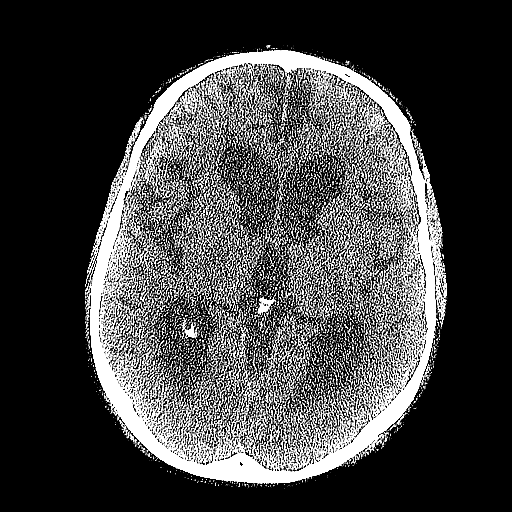
[im 34/68  brain]
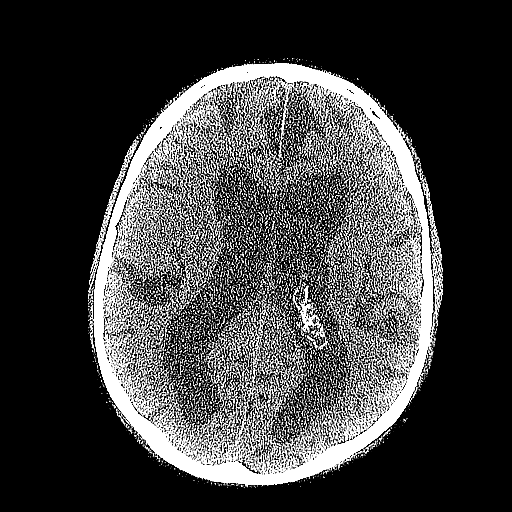
[im 34/68  bone]
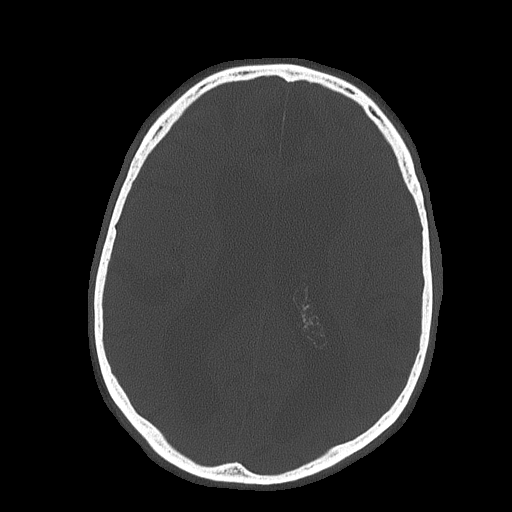
[im 41/68  brain]
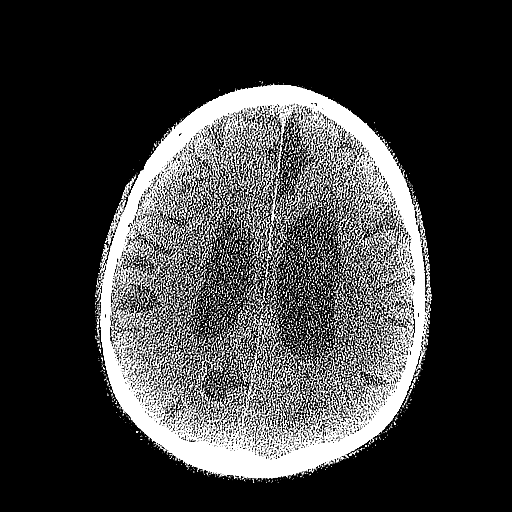
[im 47/68  brain]
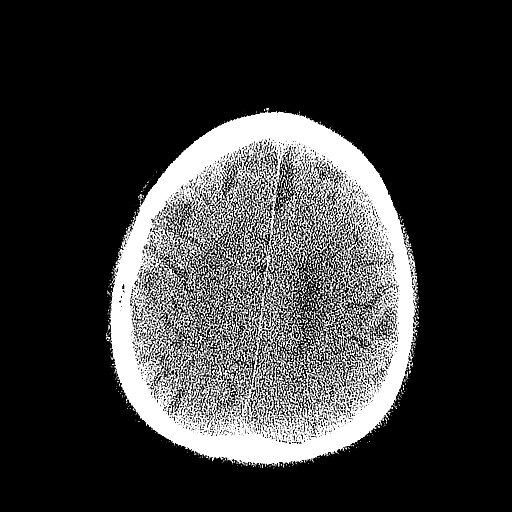
[im 54/68  brain]
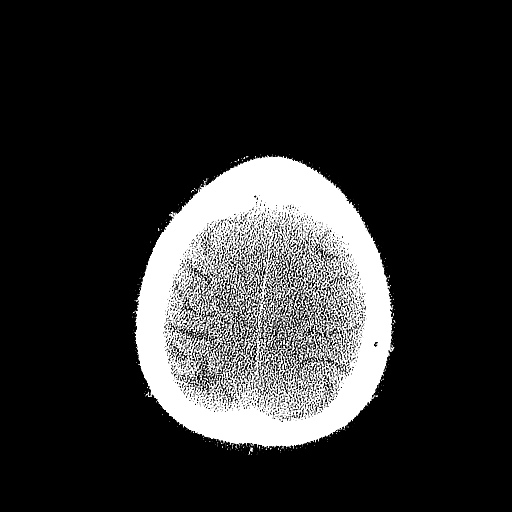
[im 61/68  brain]
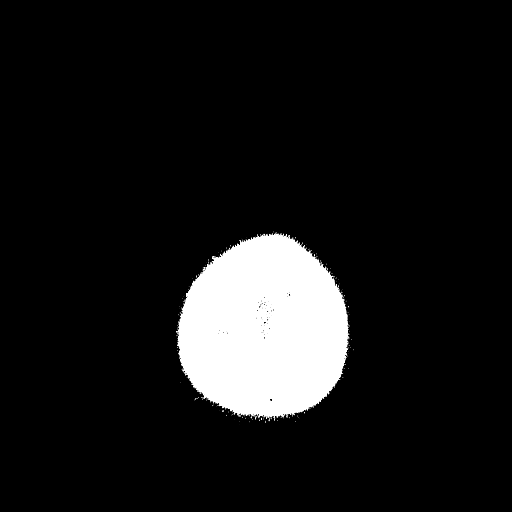
[im 61/68  bone]
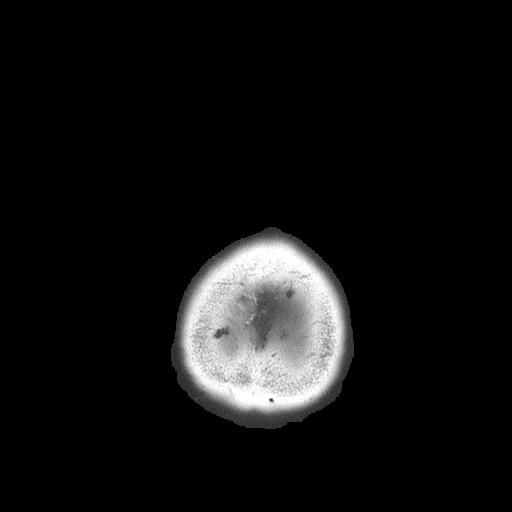

[Series 4: head 3.0 mpr cor · coronal · 0.28mm/px · 3 of 65 slices shown]
[im 22/65  brain]
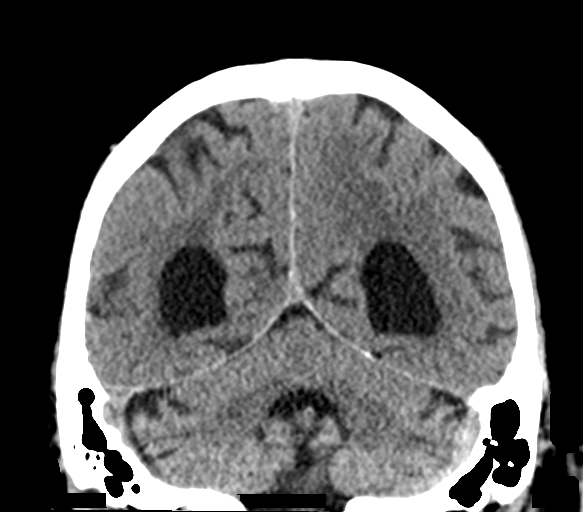
[im 29/65  brain]
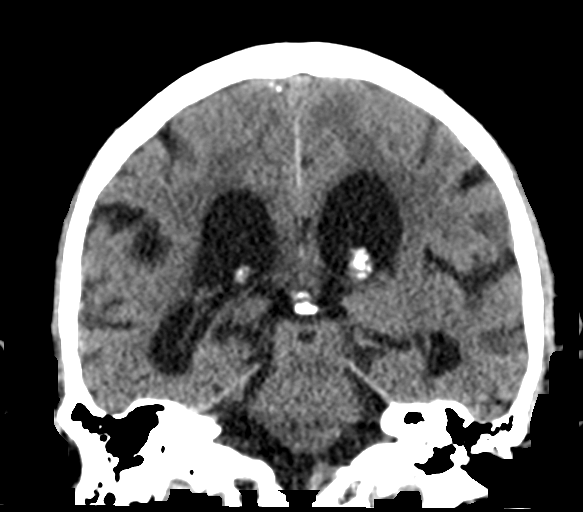
[im 36/65  brain]
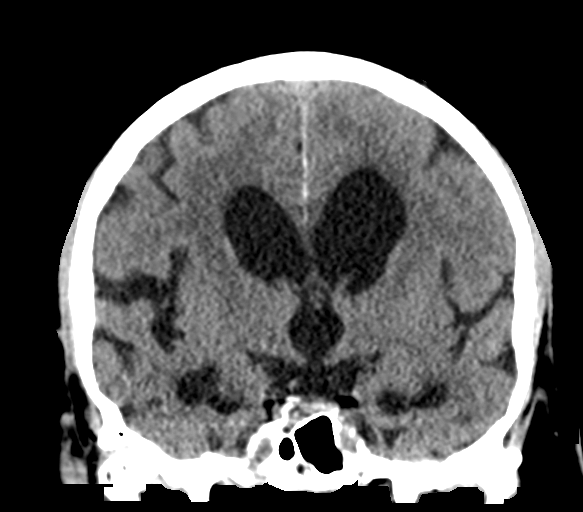

[Series 5: head 3.0 mpr sag · sagittal · 0.26mm/px · 3 of 54 slices shown]
[im 18/54  brain]
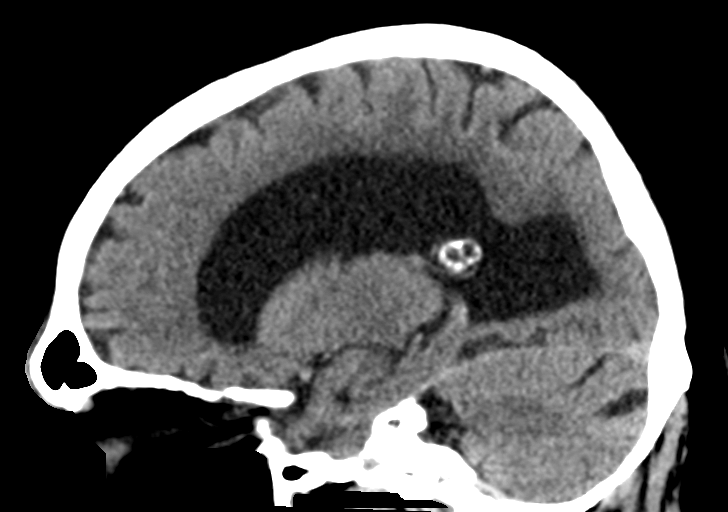
[im 27/54  brain]
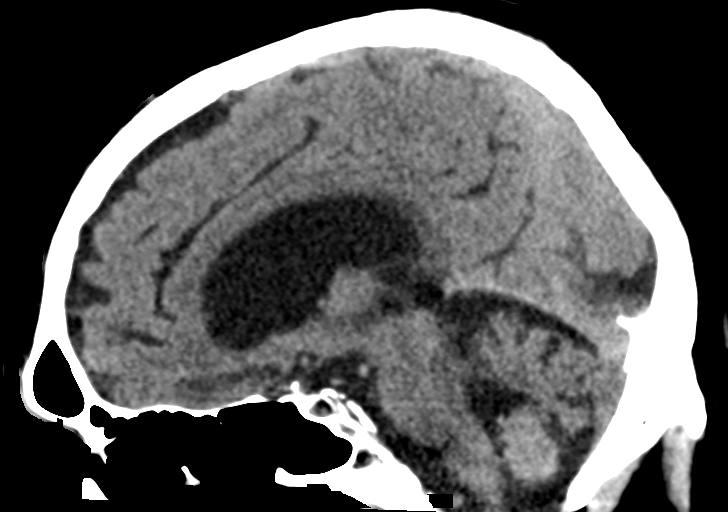
[im 36/54  brain]
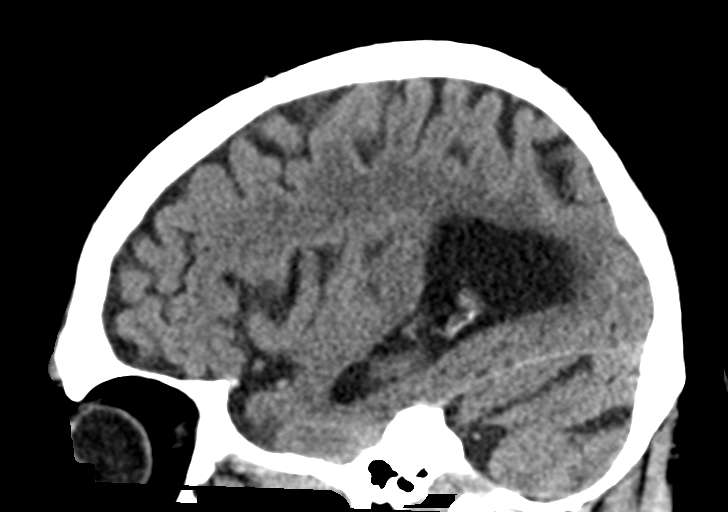

[15 of 47 positions shown; findings below may reference images not displayed]

FINDINGS: Brain: No evidence of acute infarction, hemorrhage, cerebral edema,
mass, mass effect, or midline shift. No extra-axial fluid
collection. Ventriculomegaly, with crowding of the sulci near the
vertex, as can be seen with normal pressure hydrocephalus.

Vascular: No hyperdense vessel.

Skull: Normal. Negative for fracture or focal lesion.

Sinuses/Orbits: Partial opacification of the anterior left ethmoid
air cells. Otherwise no acute finding.

Other: The mastoid air cells are well aerated.
IMPRESSION: IMPRESSION
1. Redemonstrated ventriculomegaly with crowding of the sulci near
the vertex, as can be seen in normal pressure hydrocephalus, a
clinical diagnosis. Correlate with symptoms and consider CSF tap
test, if clinically indicated.
2.  No acute intracranial process.

## 2022-08-11 ENCOUNTER — Other Ambulatory Visit (HOSPITAL_BASED_OUTPATIENT_CLINIC_OR_DEPARTMENT_OTHER): Payer: Self-pay | Admitting: General Surgery

## 2022-08-11 ENCOUNTER — Encounter (HOSPITAL_BASED_OUTPATIENT_CLINIC_OR_DEPARTMENT_OTHER): Payer: Medicare Other | Attending: General Surgery | Admitting: General Surgery

## 2022-08-11 DIAGNOSIS — Z7901 Long term (current) use of anticoagulants: Secondary | ICD-10-CM | POA: Insufficient documentation

## 2022-08-11 DIAGNOSIS — M1990 Unspecified osteoarthritis, unspecified site: Secondary | ICD-10-CM | POA: Diagnosis not present

## 2022-08-11 DIAGNOSIS — I10 Essential (primary) hypertension: Secondary | ICD-10-CM | POA: Diagnosis not present

## 2022-08-11 DIAGNOSIS — S81801A Unspecified open wound, right lower leg, initial encounter: Secondary | ICD-10-CM | POA: Insufficient documentation

## 2022-08-11 DIAGNOSIS — I480 Paroxysmal atrial fibrillation: Secondary | ICD-10-CM | POA: Insufficient documentation

## 2022-08-11 DIAGNOSIS — E785 Hyperlipidemia, unspecified: Secondary | ICD-10-CM | POA: Insufficient documentation

## 2022-08-11 DIAGNOSIS — I2510 Atherosclerotic heart disease of native coronary artery without angina pectoris: Secondary | ICD-10-CM | POA: Insufficient documentation

## 2022-08-11 DIAGNOSIS — I872 Venous insufficiency (chronic) (peripheral): Secondary | ICD-10-CM | POA: Diagnosis not present

## 2022-08-11 DIAGNOSIS — L97828 Non-pressure chronic ulcer of other part of left lower leg with other specified severity: Secondary | ICD-10-CM | POA: Diagnosis not present

## 2022-08-11 DIAGNOSIS — C44729 Squamous cell carcinoma of skin of left lower limb, including hip: Secondary | ICD-10-CM | POA: Diagnosis not present

## 2022-08-11 NOTE — Progress Notes (Signed)
TRACE, WIRICK ([id number]) [phone] [id number] Page 1 of 4 Visit Report for 08/11/2022 Abuse Risk Screen Details Patient Name: Date of Service: [name], [name] RD E. 08/11/2022 1:30 PM Medical Record Number: [id number] Patient Account Number: [id number] Date of Birth/Sex: Treating RN: November 16, 1935 (86 y.o. [name] Primary Care Toussaint Golson: [name] Other Clinician: Referring Saddie Sandeen: Treating Haim Hansson/Extender: [name] in Treatment: 0 Abuse Risk Screen Items Answer ABUSE RISK SCREEN: Has anyone close to you tried to hurt or harm you recentlyo No Do you feel uncomfortable with anyone in your familyo No Has anyone forced you do things that you didnt want to doo No Electronic Signature(s) Signed: 08/11/2022 4:08:33 PM By: [name] Entered By: [name] on 08/11/2022 14:02:55 -------------------------------------------------------------------------------- Activities of Daily Living Details Patient Name: Date of Service: [name], [name] RD E. 08/11/2022 1:30 PM Medical Record Number: [id number] Patient Account Number: [id number] Date of Birth/Sex: Treating RN: 26-Sep-1935 (86 y.o. [name] Primary Care Zaylie Gisler: [name] Other Clinician: Referring Jisel Fleet: Treating Marnette Perkins/Extender: [name] in Treatment: 0 Activities of Daily Living Items Answer Activities of Daily Living (Please select one for each item) Drive Automobile Completely Able T Medications ake Completely Able Use T elephone Completely Able Care for Appearance Completely Able Use T oilet Completely Able Bath / Shower Completely Able Dress Self Completely Able Feed Self Completely Able Walk Need Assistance Get In / Out Bed Completely [place] for Self Need Assistance Electronic Signature(s) Signed: 08/11/2022  4:08:33 PM By: [name] Entered By: [name] on 08/11/2022 14:03:16 Dunford, Efe E ([id number]) [id number] Page 2 of 4 -------------------------------------------------------------------------------- Education Screening Details Patient Name: Date of Service: [name], [name] RD E. 08/11/2022 1:30 PM Medical Record Number: [id number] Patient Account Number: [id number] Date of Birth/Sex: Treating RN: [birth year] (86 y.o. [name] Primary Care Tylik Treese: [name] Other Clinician: Referring Amylynn Fano: Treating Temia Debroux/Extender: [name] in Treatment: 0 Primary Learner Assessed: Patient Learning Preferences/Education Level/Primary Language Learning Preference: Explanation, Demonstration, Video, Printed Material Highest Education Level: College or Above Preferred Language: English Cognitive Barrier Language Barrier: No Translator Needed: No Memory Deficit: No Emotional Barrier: No Cultural/Religious Beliefs Affecting Medical Care: No Physical Barrier Impaired Vision: Yes Glasses Impaired Hearing: No Decreased Hand dexterity: No Knowledge/Comprehension Knowledge Level: Medium Comprehension Level: Medium Ability to understand written instructions: Medium Ability to understand verbal instructions: Medium Motivation Anxiety Level: Calm Cooperation: Cooperative Education Importance: Acknowledges Need Interest in Health Problems: Asks Questions Perception: Coherent Willingness to Engage in Self-Management Medium Activities: Readiness to Engage in Self-Management Medium Activities: Electronic Signature(s) Signed: 08/11/2022 4:08:33 PM By: [name] Entered By: [name] on 08/11/2022 14:03:39 -------------------------------------------------------------------------------- Fall Risk Assessment Details Patient Name: Date of Service: [name], [name] RD E. 08/11/2022 1:30 PM Medical  Record Number: [id number] Patient Account Number: [id number] Date of Birth/Sex: Treating RN: [birth year] (86 y.o. [name] Primary Care Torben Soloway: [name] Other Clinician: Referring Britley Gashi: Treating Lilee Aldea/Extender: [name] in Treatment: 0 Fall Risk Assessment Items Have you had 2 or more falls in the last 12 monthso 0 No DAYMEN, HASSEBROCK E ([id number]) [phone] [id number] Page 3 of 4 Have you had any fall that resulted in injury in the last 12 monthso 0 No FALLS RISK SCREEN History of falling - immediate or within 3 months 0 No Secondary diagnosis (Do you have 2 or more medical diagnoseso) 15 Yes Ambulatory aid  None/bed rest/wheelchair/nurse 0 No Crutches/cane/walker 15 Yes Furniture 0 No Intravenous therapy Access/Saline/Heparin Lock 0 No Gait/Transferring Normal/ bed rest/ wheelchair 0 Yes Weak (short steps with or without shuffle, stooped but able to lift head while walking, may seek 0 No support from furniture) Impaired (short steps with shuffle, may have difficulty arising from chair, head down, impaired 0 No balance) Mental Status Oriented to own ability 0 Yes Electronic Signature(s) Signed: 08/11/2022 4:08:33 PM By: [name] Entered By: [name] on 08/11/2022 14:03:54 -------------------------------------------------------------------------------- Foot Assessment Details Patient Name: Date of Service: [name], [name] RD E. 08/11/2022 1:30 PM Medical Record Number: [id number] Patient Account Number: [id number] Date of Birth/Sex: Treating RN: 10-21-1935 (86 y.o. [name] Primary Care Traylon Schimming: [name] Other Clinician: Referring Kashmir Leedy: Treating Eris Breck/Extender: [name] in Treatment: 0 Foot Assessment Items Site Locations + = Sensation present, - = Sensation absent, C = Callus, U = Ulcer R = Redness, W = Warmth, M = Maceration, PU = Pre-ulcerative  lesion F = Fissure, S = Swelling, D = Dryness Assessment Right: Left: Other Deformity: No No Prior Foot Ulcer: No No Prior Amputation: No No Charcot Joint: No No Ambulatory Status: Ambulatory With Help Assistance Device: JAYREN, CEASE ([id number]) [phone] [id number] Page 4 of 4 Gait: Steady Electronic Signature(s) Signed: 08/11/2022 4:08:33 PM By: [name] Entered By: [name] on 08/11/2022 14:04:17 -------------------------------------------------------------------------------- Nutrition Risk Screening Details Patient Name: Date of Service: [name], [name] RD E. 08/11/2022 1:30 PM Medical Record Number: [id number] Patient Account Number: [id number] Date of Birth/Sex: Treating RN: [birth year] (86 y.o. [name] Primary Care Aveer Bartow: [name] Other Clinician: Referring Cylan Borum: Treating Reola Buckles/Extender: [name] in Treatment: 0 Height (in): Weight (lbs): Body Mass Index (BMI): Nutrition Risk Screening Items Score Screening NUTRITION RISK SCREEN: I have an illness or condition that made me change the kind and/or amount of food I eat 0 No I eat fewer than two meals per day 0 No I eat few fruits and vegetables, or milk products 0 No I have three or more drinks of beer, liquor or wine almost every day 0 No I have tooth or mouth problems that make it hard for me to eat 0 No I don't always have enough money to buy the food I need 0 No I eat alone most of the time 0 No I take three or more different prescribed or over-the-counter drugs a day 1 Yes Without wanting to, I have lost or gained 10 pounds in the last six months 0 No I am not always physically able to shop, cook and/or feed myself 0 No Nutrition Protocols Good Risk Protocol 0 No interventions needed Moderate Risk Protocol High Risk Proctocol Risk Level: Good Risk Score: 1 Electronic Signature(s) Signed: 08/11/2022 4:08:33 PM By:  [name] Entered By: [name] on 08/11/2022 14:04:06

## 2022-08-11 NOTE — Progress Notes (Signed)
[name], [name] ([id number]) [id number] Page 1 of 9 Visit Report for 08/11/2022 Biopsy Details Patient Name: Date of Service: [name], [name] 08/11/2022 1:30 PM Medical Record Number: [id number] Patient Account Number: [id number] Date of Birth/Sex: Treating RN: Oct 07, 1935 (86 y.o. [name] Primary Care Provider: [name] Other Clinician: Referring Provider: Treating Provider/Extender: [name] in Treatment: 0 Biopsy Performed for: NonWound Condition Other Dermatologic Condition - Left Leg Location(s): Other: Other Dermatologic Condition - Left Leg Performed By: Physician Fredirick Maudlin, MD Tissue Punch: Yes Size (mm): 5 Number of Specimens T aken: 1 Specimen Sent T Pathology: o Yes Level of Consciousness (Pre-procedure): Awake and Alert Pre-procedure Verification/Time-Out Taken: Yes - 14:25 Pain Control: Lidocaine Injectable Lidocaine Percent: 1% Instrument: Forceps, Scissors Bleeding: Minimum Hemostasis Achieved: Silver Nitrate Procedural Pain: 0 Post Procedural Pain: 0 Response to Treatment: Procedure was tolerated well Level of Consciousness (Post-procedure): Awake and Alert Post Procedure Diagnosis Same as Pre-procedure Notes scribed for Dr. Celine Ahr by [name], RN Electronic Signature(s) Signed: 08/11/2022 3:41:18 PM By: Fredirick Maudlin MD FACS Signed: 08/11/2022 4:08:33 PM By: [name] Entered By: [name] on 08/11/2022 14:46:54 -------------------------------------------------------------------------------- Chief Complaint Document Details Patient Name: Date of Service: [name], [name] 08/11/2022 1:30 PM Medical Record Number: [id number] Patient Account Number: [id number] Date of Birth/Sex: Treating RN: 11/10/1935 (86 y.o. M) Primary Care Provider: [name] Other Clinician: Referring Provider: Treating Provider/Extender: [name] in  Treatment: 0 Information Obtained from: Patient Chief Complaint Left lower extremity wound Electronic Signature(s) Signed: 08/11/2022 2:37:04 PM By: Fredirick Maudlin MD FACS [name], [name] ([id number]) [id number] Page 2 of 9 Entered By: Fredirick Maudlin on 08/11/2022 14:37:03 -------------------------------------------------------------------------------- HPI Details Patient Name: Date of Service: [name], [name] 08/11/2022 1:30 PM Medical Record Number: [id number] Patient Account Number: [id number] Date of Birth/Sex: Treating RN: Oct 06, 1935 (86 y.o. M) Primary Care Provider: [name] Other Clinician: Referring Provider: Treating Provider/Extender: [name] in Treatment: 0 History of Present Illness HPI Description: [name] is an 86 year old male with a past medical history of CAD s/p DES to LCx 2006, carotid artery disease, hypertension, hyperlipidemia, paroxysmal atrial fibrillation on Eliquis and Cardizem that presents today for right lower extremity wound. He was seen by Dr. Larose Kells, his primary care provider on 4/29 for this issue and was referred to our clinic. Patient states that he had a small wound that spontaneously started in October 2021 and has not healed. He states this has progressively gotten larger. He has been using acne cream prescribed by the dermatologist for this issue. He reports drainage to the wound but no purulent drainage. He reports mild soreness to the wound. He has swelling in his right leg greater than left and reports this is a chronic issue for the past 1 to 2 years. He denies resting leg pain or pain with ambulation. Of note He was prescribed doxycycline at the end of last month and has finished this course for possible skin infection to the wound site. 02/06/2021; I am seeing this patient who was admitted to the clinic last week by Dr. Heber Mogul. He has predominantly I think a venous insufficiency wound  on the right medial lower leg he has been using Santyl Hydrofera Blue under 3 layer compression. Arterial studies were ordered last week but do not seem to been put through. He is not a diabetic. He did have venous reflux studies done in August 2020. He did have abnormal reflux time  was noted in the great saphenous vein in the distal thigh and the great saphenous vein at the knee. There was no evidence of DVT or SVT at the time he was not felt to have large enough saphenous veins on either side for intervention. Compression stockings at least knee-high were recommended. Follow-up was on a as needed basis with Dr. Donzetta Matters The patient does not describe claudication but his pulses in his feet are not vibrant this could be because of the swelling 5/26; patient presents for 1 week follow-up. He has been using Hydrofera Blue under 3 layer compression. He had arterial studies done. He has no issues or complaints today. He denies signs of infection. He has his juxta light compressions with him today. 6/3; patient presents for 1 week follow-up. He has been using Hydrofera Blue under 3 layer compression. He denies signs and symptoms of infection. He did have bright green drainage which he states he has not seen before when the dressing was taken off today. He is using his juxta light compression to the left leg. 6/10; patient presents for 1 week follow-up. He has been tolerating Hydrofera Blue with 3 layer compression. He again reports bright green drainage. However he denies any signs of infection. He has been using juxta light compression to the left leg. 6/24; patient presents for 2-week follow-up. He has been tolerating Hydrofera Blue with 3 layer compression. Today he is healed. He brought his juxta lite compression for the right leg and continues to wear the juxta light compression on the left leg READMISSION 05/05/2022 The patient returns to clinic today with a new wound on his left posterior leg. He has  been wearing his juxta lite stockings on a regular basis, but on exam he still has 3+ pitting edema. I suspect he has lymphedema in addition to his known venous reflux. The wounds are scattered geographic wounds with light slough accumulation. They have been applying mupirocin and CeraVe ointment. He does have a VP shunt placement scheduled for next week, but it sounds like his neurosurgeon is not aware of the fact that he has an open wound. 05/11/2022: His VP shunt placement has been postponed while we get his wound to heal. The wounds are all smaller today with just a little eschar and minimal slough. Edema control is good. No concern for infection. 05/18/2022: All of the open areas have contracted and there is good perimeter epithelialization. There is a little bit of eschar and slough on the open areas. Good edema control. No concern for infection. 05/26/2022: Many of the small open areas have closed. There is Hydrofera Blue sponge stuck in a number of places and this is unable to be removed by the intake nurse. There is a fair amount of eschar and a little bit of slough present. 06/02/2022: Continued closure of small open skin sites. There are just a couple remaining and these have a little eschar on the surface. Edema control is excellent. 06/09/2022: We are down to just 1 small open area. There is some eschar and slough present. No concern for infection. Edema control is excellent. 06/16/2022: His wound is healed. READMISSION 08/11/2022 Mr. Short underwent a successful VP shunt placement. He returns to clinic today because of a suspicious lesion on his left posterior calf. He actually has no open wounds and his edema control is excellent. He is wearing his juxta lite stockings religiously. On his left posterior calf, there is a raised scaly lesion concerning for potential squamous cell carcinoma. Electronic  Signature(s) Signed: 08/11/2022 2:39:00 PM By: Fredirick Maudlin MD FACS [name], [name]  ([id number]) [id number] Page 3 of 9 Entered By: Fredirick Maudlin on 08/11/2022 14:39:00 -------------------------------------------------------------------------------- Physical Exam Details Patient Name: Date of Service: [name], [name] 08/11/2022 1:30 PM Medical Record Number: [id number] Patient Account Number: [id number] Date of Birth/Sex: Treating RN: March 26, 1936 (86 y.o. M) Primary Care Provider: [name] Other Clinician: Referring Provider: Treating Provider/Extender: [name] in Treatment: 0 Constitutional . . . . No acute distress. Respiratory Normal work of breathing on room air.. Notes 08/11/2022: On the left posterior calf, there is a raised scaly lesion concerning for potential squamous cell skin carcinoma. Electronic Signature(s) Signed: 08/11/2022 2:42:00 PM By: Fredirick Maudlin MD FACS Entered By: Fredirick Maudlin on 08/11/2022 14:41:59 -------------------------------------------------------------------------------- Physician Orders Details Patient Name: Date of Service: [name], [name] 08/11/2022 1:30 PM Medical Record Number: [id number] Patient Account Number: [id number] Date of Birth/Sex: Treating RN: [birth year] (86 y.o. [name] Primary Care Provider: [name] Other Clinician: Referring Provider: Treating Provider/Extender: [name] in Treatment: 0 Verbal / Phone Orders: No Diagnosis Coding ICD-10 Coding Code Description L98.9 Disorder of the skin and subcutaneous tissue, unspecified I87.2 Venous insufficiency (chronic) (peripheral) I10 Essential (primary) hypertension Follow-up Appointments ppointment in 2 weeks. - Dr. Celine Ahr - room 2 Return A Anesthetic (In clinic) Lidocaine 1% HCL injection applied to wound bed - with epinephrine Bathing/ Shower/ Hygiene May shower and wash wound with soap and water. Edema Control - Lymphedema / SCD / Other Elevate legs  to the level of the heart or above for 30 minutes daily and/or when sitting, a frequency of: - throughout the day Avoid standing for long periods of time. Exercise regularly - including ankle circles while sitting Moisturize legs daily. - both legs nightly after removing juxtalites Compression stocking or Garment 20-30 mm/Hg pressure to: - both legs daily Non Wound Condition [name], [name] ([id number]) [id number] Page 4 of 9 Cleanse area with: - soap and water Protect area with: - band-aid Other Non Wound Condition Orders/Instructions: - leave dressing applied in clinic on for 24 hours Wound Treatment Electronic Signature(s) Signed: 08/11/2022 3:41:18 PM By: Fredirick Maudlin MD FACS Entered By: Fredirick Maudlin on 08/11/2022 14:42:18 -------------------------------------------------------------------------------- Problem List Details Patient Name: Date of Service: [name], [name] 08/11/2022 1:30 PM Medical Record Number: [id number] Patient Account Number: [id number] Date of Birth/Sex: Treating RN: 1936/04/06 (86 y.o. M) Primary Care Provider: [name] Other Clinician: Referring Provider: Treating Provider/Extender: [name] in Treatment: 0 Active Problems ICD-10 Encounter Code Description Active Date MDM Diagnosis L98.9 Disorder of the skin and subcutaneous tissue, unspecified 08/11/2022 No Yes I87.2 Venous insufficiency (chronic) (peripheral) 08/11/2022 No Yes I10 Essential (primary) hypertension 08/11/2022 No Yes Inactive Problems Resolved Problems Electronic Signature(s) Signed: 08/11/2022 2:21:14 PM By: Fredirick Maudlin MD FACS Entered By: Fredirick Maudlin on 08/11/2022 14:21:14 -------------------------------------------------------------------------------- Progress Note Details Patient Name: Date of Service: [name], [name] 08/11/2022 1:30 PM Medical Record Number: [id number] Patient Account Number:  [id number] Date of Birth/Sex: Treating RN: [birth year] (85 y.o. M) Primary Care Provider: [name] Other Clinician: Referring Provider: Treating Provider/Extender: [name] in Treatment: 0 Subjective Chief Complaint [name], [name] ([id number]) [id number] Page 5 of 9 Information obtained from Patient Left lower extremity wound History of Present Illness (HPI) [name] is an 86 year old male with a past medical history of CAD s/p DES to LCx 2006, carotid artery disease, hypertension,  hyperlipidemia, paroxysmal atrial fibrillation on Eliquis and Cardizem that presents today for right lower extremity wound. He was seen by Dr. Larose Kells, his primary care provider on 4/29 for this issue and was referred to our clinic. Patient states that he had a small wound that spontaneously started in October 2021 and has not healed. He states this has progressively gotten larger. He has been using acne cream prescribed by the dermatologist for this issue. He reports drainage to the wound but no purulent drainage. He reports mild soreness to the wound. He has swelling in his right leg greater than left and reports this is a chronic issue for the past 1 to 2 years. He denies resting leg pain or pain with ambulation. Of note He was prescribed doxycycline at the end of last month and has finished this course for possible skin infection to the wound site. 02/06/2021; I am seeing this patient who was admitted to the clinic last week by Dr. Heber Shipshewana. He has predominantly I think a venous insufficiency wound on the right medial lower leg he has been using Santyl Hydrofera Blue under 3 layer compression. Arterial studies were ordered last week but do not seem to been put through. He is not a diabetic. He did have venous reflux studies done in August 2020. He did have abnormal reflux time was noted in the great saphenous vein in the distal thigh and the great saphenous vein at  the knee. There was no evidence of DVT or SVT at the time he was not felt to have large enough saphenous veins on either side for intervention. Compression stockings at least knee-high were recommended. Follow-up was on a as needed basis with Dr. Donzetta Matters The patient does not describe claudication but his pulses in his feet are not vibrant this could be because of the swelling 5/26; patient presents for 1 week follow-up. He has been using Hydrofera Blue under 3 layer compression. He had arterial studies done. He has no issues or complaints today. He denies signs of infection. He has his juxta light compressions with him today. 6/3; patient presents for 1 week follow-up. He has been using Hydrofera Blue under 3 layer compression. He denies signs and symptoms of infection. He did have bright green drainage which he states he has not seen before when the dressing was taken off today. He is using his juxta light compression to the left leg. 6/10; patient presents for 1 week follow-up. He has been tolerating Hydrofera Blue with 3 layer compression. He again reports bright green drainage. However he denies any signs of infection. He has been using juxta light compression to the left leg. 6/24; patient presents for 2-week follow-up. He has been tolerating Hydrofera Blue with 3 layer compression. Today he is healed. He brought his juxta lite compression for the right leg and continues to wear the juxta light compression on the left leg READMISSION 05/05/2022 The patient returns to clinic today with a new wound on his left posterior leg. He has been wearing his juxta lite stockings on a regular basis, but on exam he still has 3+ pitting edema. I suspect he has lymphedema in addition to his known venous reflux. The wounds are scattered geographic wounds with light slough accumulation. They have been applying mupirocin and CeraVe ointment. He does have a VP shunt placement scheduled for next week, but it sounds  like his neurosurgeon is not aware of the fact that he has an open wound. 05/11/2022: His VP shunt placement has been  postponed while we get his wound to heal. The wounds are all smaller today with just a little eschar and minimal slough. Edema control is good. No concern for infection. 05/18/2022: All of the open areas have contracted and there is good perimeter epithelialization. There is a little bit of eschar and slough on the open areas. Good edema control. No concern for infection. 05/26/2022: Many of the small open areas have closed. There is Hydrofera Blue sponge stuck in a number of places and this is unable to be removed by the intake nurse. There is a fair amount of eschar and a little bit of slough present. 06/02/2022: Continued closure of small open skin sites. There are just a couple remaining and these have a little eschar on the surface. Edema control is excellent. 06/09/2022: We are down to just 1 small open area. There is some eschar and slough present. No concern for infection. Edema control is excellent. 06/16/2022: His wound is healed. READMISSION 08/11/2022 Mr. Short underwent a successful VP shunt placement. He returns to clinic today because of a suspicious lesion on his left posterior calf. He actually has no open wounds and his edema control is excellent. He is wearing his juxta lite stockings religiously. On his left posterior calf, there is a raised scaly lesion concerning for potential squamous cell carcinoma. Patient History Information obtained from Patient. Allergies No Known Allergies Family History Cancer - Father, Diabetes - Maternal Grandparents, Heart Disease - Maternal Grandparents, Hypertension - Maternal Grandparents, No family history of Hereditary Spherocytosis, Kidney Disease, Lung Disease, Seizures, Stroke, Thyroid Problems, Tuberculosis. Social History Former smoker - Quit over 30 years ago, Marital Status - Married, Alcohol Use - Rarely, Drug Use - No  History, Caffeine Use - Daily. Medical History Eyes Patient has history of Glaucoma Cardiovascular Patient has history of Arrhythmia - afib, Coronary Artery Disease, Hypertension, Peripheral Venous Disease Endocrine Denies history of Type I Diabetes, Type II Diabetes Genitourinary Denies history of End Stage Renal Disease Integumentary (Skin) Denies history of History of Burn [name], [name] ([id number]) [id number] Page 6 of 9 Musculoskeletal Patient has history of Osteoarthritis Oncologic Denies history of Received Chemotherapy, Received Radiation Psychiatric Denies history of Anorexia/bulimia, Confinement Anxiety Hospitalization/Surgery History - lumbar laminectomy. - total knee arthropathy right. - bil total hip arthroplasty. - coronary stents. - toe surgery. - tonsillectomy. - shunt insertion. Medical A Surgical History Notes nd Gastrointestinal colon polyps Endocrine Hypothyroidism Genitourinary BPH Musculoskeletal Bilat Hip Replacements, Right Knee Replacement, lumbar stenosis Neurologic hydrocephalus Oncologic Skin Cancer multiple sites Objective Constitutional No acute distress. Vitals Time Taken: 2:02 PM, Temperature: 98.8 F, Pulse: 68 bpm, Respiratory Rate: 18 breaths/min, Blood Pressure: 131/79 mmHg. Respiratory Normal work of breathing on room air.. General Notes: 08/11/2022: On the left posterior calf, there is a raised scaly lesion concerning for potential squamous cell skin carcinoma. Assessment Active Problems ICD-10 Disorder of the skin and subcutaneous tissue, unspecified Venous insufficiency (chronic) (peripheral) Essential (primary) hypertension Procedures There was a biopsy performed by Fredirick Maudlin, MD. The skin was cleansed and prepped with anti-septic followed by pain control using Lidocaine Injectable: 1%. Utilizing a 4 mm tissue punch, tissue was removed at its base with the following instrument(s): Forceps  and Scissors and sent to pathology. A Minimum amount of bleeding was controlled with Silver Nitrate. A time out was conducted prior to the start of the procedure. The procedure was tolerated well with a pain level of 0 throughout and a pain level of 0 following the procedure. Patientoos Level  of Consciousness post procedure was recorded as Awake and Alert. Post procedure Diagnosis Wound #: Same as Pre-Procedure Plan Follow-up Appointments: Return Appointment in 2 weeks. - Dr. Celine Ahr - room 2 Anesthetic: (In clinic) Lidocaine 1% HCL injection applied to wound bed - with epinephrine Bathing/ Shower/ Hygiene: May shower and wash wound with soap and water. [name], [name] ([id number]) [id number] Page 7 of 9 Edema Control - Lymphedema / SCD / Other: Elevate legs to the level of the heart or above for 30 minutes daily and/or when sitting, a frequency of: - throughout the day Avoid standing for long periods of time. Exercise regularly - including ankle circles while sitting Moisturize legs daily. - both legs nightly after removing juxtalites Compression stocking or Garment 20-30 mm/Hg pressure to: - both legs daily Non Wound Condition: Cleanse area with: - soap and water Protect area with: - band-aid Other Non Wound Condition Orders/Instructions: - leave dressing applied in clinic on for 24 hours 08/11/2022: On the left posterior calf, there is a raised scaly lesion concerning for potential squamous cell skin carcinoma. After obtaining consent, I performed a punch biopsy of the lesion, using 1% lidocaine with epinephrine to obtain local anesthesia. The specimen was sent for pathology. Hemostasis was achieved with silver nitrate. Mupirocin and a foam border dressing was applied. The patient was instructed to leave the dressing in place for 24 hours and then he can wash it with soap and water and just apply a Band-Aid. Once the pathology results are back, we will contact  him. He can continue to wear his juxta lite stockings. I would like to see him back in 2 weeks to check the biopsy site for healing. Electronic Signature(s) Signed: 08/11/2022 2:44:55 PM By: Fredirick Maudlin MD FACS Entered By: Fredirick Maudlin on 08/11/2022 14:44:54 -------------------------------------------------------------------------------- HxROS Details Patient Name: Date of Service: [name], [name] 08/11/2022 1:30 PM Medical Record Number: [id number] Patient Account Number: [id number] Date of Birth/Sex: Treating RN: 1936/02/25 (86 y.o. [name] Primary Care Provider: [name] Other Clinician: Referring Provider: Treating Provider/Extender: [name] in Treatment: 0 Information Obtained From Patient Eyes Medical History: Positive for: Glaucoma Cardiovascular Medical History: Positive for: Arrhythmia - afib; Coronary Artery Disease; Hypertension; Peripheral Venous Disease Gastrointestinal Medical History: Past Medical History Notes: colon polyps Endocrine Medical History: Negative for: Type I Diabetes; Type II Diabetes Past Medical History Notes: Hypothyroidism Genitourinary Medical History: Negative for: End Stage Renal Disease Past Medical History Notes: BPH Integumentary (Skin) Medical HistoryDAESHAWN, [name] ([id number]) [id number] Page 8 of 9 Negative for: History of Burn Musculoskeletal Medical History: Positive for: Osteoarthritis Past Medical History Notes: Bilat Hip Replacements, Right Knee Replacement, lumbar stenosis Neurologic Medical History: Past Medical History Notes: hydrocephalus Oncologic Medical History: Negative for: Received Chemotherapy; Received Radiation Past Medical History Notes: Skin Cancer multiple sites Psychiatric Medical History: Negative for: Anorexia/bulimia; Confinement Anxiety HBO Extended History Items Eyes: Glaucoma Immunizations Pneumococcal  Vaccine: Received Pneumococcal Vaccination: Yes Received Pneumococcal Vaccination On or After 60th Birthday: Yes Implantable Devices None Hospitalization / Surgery History Type of Hospitalization/Surgery lumbar laminectomy total knee arthropathy right bil total hip arthroplasty coronary stents toe surgery tonsillectomy shunt insertion Family and Social History Cancer: Yes - Father; Diabetes: Yes - Maternal Grandparents; Heart Disease: Yes - Maternal Grandparents; Hereditary Spherocytosis: No; Hypertension: Yes - Maternal Grandparents; Kidney Disease: No; Lung Disease: No; Seizures: No; Stroke: No; Thyroid Problems: No; Tuberculosis: No; Former smoker - Quit over 30 years ago; Marital Status - Married; Alcohol  Use: Rarely; Drug Use: No History; Caffeine Use: Daily; Financial Concerns: No; Food, Clothing or Shelter Needs: No; Support System Lacking: No; Transportation Concerns: No Electronic Signature(s) Signed: 08/11/2022 3:41:18 PM By: Fredirick Maudlin MD FACS Signed: 08/11/2022 4:08:33 PM By: [name] Entered By: [name] on 08/11/2022 14:02:50 -------------------------------------------------------------------------------- SuperBill Details Patient Name: Date of Service: [name], [name] 08/11/2022 Medical Record Number: [id number] Patient Account Number: [id number] Date of Birth/Sex: Treating RN: [birth year] (86 y.o. M) Primary Care Provider: [name] Other Clinician: Referring Provider: Treating Provider/Extender: Otelia Sergeant [place], [place] E ([id number]) [phone].pdf Page 9 of 9 Weeks in Treatment: 0 Diagnosis Coding ICD-10 Codes Code Description L98.9 Disorder of the skin and subcutaneous tissue, unspecified I87.2 Venous insufficiency (chronic) (peripheral) I10 Essential (primary) hypertension Facility Procedures : CPT4 Code: 33295188 Description: 41660 - WOUND CARE VISIT-LEV 3 EST PT Modifier:  25 Quantity: 1 : CPT4 Code: 63016010 Description: 11104-Punch biopsy of skin (including simple closure, when performed) single lesion ICD-10 Diagnosis Description L98.9 Disorder of the skin and subcutaneous tissue, unspecified Modifier: Quantity: 1 Physician Procedures : CPT4 Code Description Modifier [id number] 32202 - WC PHYS LEVEL 4 - EST PT ICD-10 Diagnosis Description L98.9 Disorder of the skin and subcutaneous tissue, unspecified I87.2 Venous insufficiency (chronic) (peripheral) I10 Essential (primary) hypertension Quantity: 1 : 11104 Punch biopsy of skin (including simple closure, when performed) single lesion ICD-10 Diagnosis Description L98.9 Disorder of the skin and subcutaneous tissue, unspecified Quantity: 1 Electronic Signature(s) Signed: 08/11/2022 3:41:18 PM By: Fredirick Maudlin MD FACS Signed: 08/11/2022 4:08:33 PM By: [name] Previous Signature: 08/11/2022 2:45:26 PM Version By: Fredirick Maudlin MD FACS Entered By: [name] on 08/11/2022 14:48:53

## 2022-08-11 NOTE — Progress Notes (Signed)
[name], [name] ([id number]) [id number] Page 1 of 6 Visit Report for 08/11/2022 Allergy List Details Patient Name: Date of Service: [name], [name] [name] 08/11/2022 1:30 PM Medical Record Number: [id number] Patient Account Number: [id number] Date of Birth/Sex: Treating RN: 12/19/1935 (86 y.o. [name] Primary Care [name]: [name] Other Clinician: Referring [name]: Treating [name]/Extender: Tonette Lederer, Jacqulyn Bath Weeks in Treatment: 0 Allergies Active Allergies No Known Allergies Allergy Notes Electronic Signature(s) Signed: 08/11/2022 4:08:33 PM By: [name] Entered By: [name] on 08/11/2022 14:02:28 -------------------------------------------------------------------------------- Arrival Information Details Patient Name: Date of Service: [name], [name] [name] 08/11/2022 1:30 PM Medical Record Number: [id number] Patient Account Number: [id number] Date of Birth/Sex: Treating RN: 08-29-1936 (86 y.o. [name] Primary Care [name]: [name] Other Clinician: Referring [name]: Treating [name]/Extender: [name] in Treatment: 0 Visit Information Patient Arrived: Cane Arrival Time: 14:01 Accompanied By: wife Transfer Assistance: None Patient Identification Verified: Yes Secondary Verification Process Completed: Yes History Since Last Visit Added or deleted any medications: No Any new allergies or adverse reactions: No Had a fall or experienced change in activities of daily living that may affect risk of falls: No Signs or symptoms of abuse/neglect since last visito No Hospitalized since last visit: No Implantable device outside of the clinic excluding cellular tissue based products placed in the center since last visit: No Electronic Signature(s) Signed: 08/11/2022 4:08:33 PM By: [name] Entered By: [name] on 08/11/2022 14:02:09 [name]  ([id number]) [id number] Page 2 of 6 -------------------------------------------------------------------------------- Clinic Level of Care Assessment Details Patient Name: Date of Service: [name], [name] [name] 08/11/2022 1:30 PM Medical Record Number: [id number] Patient Account Number: [id number] Date of Birth/Sex: Treating RN: 1936/05/15 (86 y.o. [name] Primary Care [name]: [name] Other Clinician: Referring [name]: Treating [name]/Extender: [name] in Treatment: 0 Clinic Level of Care Assessment Items TOOL 1 Quantity Score X- 1 0 Use when EandM and Procedure is performed on INITIAL visit ASSESSMENTS - Nursing Assessment / Reassessment X- 1 20 General Physical Exam (combine w/ comprehensive assessment (listed just below) when performed on new pt. evals) X- 1 25 Comprehensive Assessment (HX, ROS, Risk Assessments, Wounds Hx, etc.) ASSESSMENTS - Wound and Skin Assessment / Reassessment X- 1 10 Dermatologic / Skin Assessment (not related to wound area) ASSESSMENTS - Ostomy and/or Continence Assessment and Care '[]'$  - 0 Incontinence Assessment and Management '[]'$  - 0 Ostomy Care Assessment and Management (repouching, etc.) PROCESS - Coordination of Care X - Simple Patient / Family Education for ongoing care 1 15 '[]'$  - 0 Complex (extensive) Patient / Family Education for ongoing care X- 1 10 Staff obtains Programmer, systems, Records, T Results / Process Orders est '[]'$  - 0 Staff telephones HHA, Nursing Homes / Clarify orders / etc '[]'$  - 0 Routine Transfer to another Facility (non-emergent condition) '[]'$  - 0 Routine Hospital Admission (non-emergent condition) X- 1 15 New Admissions / Biomedical engineer / Ordering NPWT Apligraf, etc. , '[]'$  - 0 Emergency Hospital Admission (emergent condition) PROCESS - Special Needs '[]'$  - 0 Pediatric / Minor Patient Management '[]'$  - 0 Isolation Patient Management '[]'$  - 0 Hearing /  Language / Visual special needs '[]'$  - 0 Assessment of Community assistance (transportation, D/C planning, etc.) '[]'$  - 0 Additional assistance / Altered mentation '[]'$  - 0 Support Surface(s) Assessment (bed, cushion, seat, etc.) INTERVENTIONS - Miscellaneous '[]'$  - 0 External ear exam '[]'$  - 0 Patient Transfer (multiple staff / Civil Service fast streamer / Similar  devices) '[]'$  - 0 Simple Staple / Suture removal (25 or less) '[]'$  - 0 Complex Staple / Suture removal (26 or more) '[]'$  - 0 Hypo/Hyperglycemic Management (do not check if billed separately) '[]'$  - 0 Ankle / Brachial Index (ABI) - do not check if billed separately Has the patient been seen at the hospital within the last three years: Yes Total Score: 95 Level Of Care: New/Established - Level 3 Electronic Signature(s) Signed: 08/11/2022 4:08:33 PM By: [name] Entered By: [name] on 08/11/2022 14:48:05 [name], [name] ([id number]) [id number] Page 3 of 6 -------------------------------------------------------------------------------- Encounter Discharge Information Details Patient Name: Date of Service: [name], [name] [name] 08/11/2022 1:30 PM Medical Record Number: [id number] Patient Account Number: [id number] Date of Birth/Sex: Treating RN: Nov 11, 1935 (86 y.o. [name] Primary Care [name]: [name] Other Clinician: Referring [name]: Treating [name]/Extender: [name] in Treatment: 0 Encounter Discharge Information Items Post Procedure Vitals Discharge Condition: Stable Temperature (F): 98.8 Ambulatory Status: Cane Pulse (bpm): 68 Discharge Destination: Home Respiratory Rate (breaths/min): 18 Transportation: Private Auto Blood Pressure (mmHg): 131/79 Accompanied By: wife Schedule Follow-up Appointment: Yes Clinical Summary of Care: Patient Declined Electronic Signature(s) Signed: 08/11/2022 4:08:33 PM By: [name] Entered By: [name] on 08/11/2022 14:50:48 -------------------------------------------------------------------------------- Lower Extremity Assessment Details Patient Name: Date of Service: [name], [name] [name] 08/11/2022 1:30 PM Medical Record Number: [id number] Patient Account Number: [id number] Date of Birth/Sex: Treating RN: 01-Oct-1935 (86 y.o. [name] Primary Care Caileen Veracruz: [name] Other Clinician: Referring Makinzey Banes: Treating Mrytle Bento/Extender: [name] in Treatment: 0 Edema Assessment Assessed: [Left: No] [Right: No] [Left: Edema] [Right: :] Calf Left: Right: Point of Measurement: From Medial Instep 37 cm Ankle Left: Right: Point of Measurement: From Medial Instep 25 cm Vascular Assessment Pulses: Dorsalis Pedis Palpable: [Left:Yes] Electronic Signature(s) Signed: 08/11/2022 4:08:33 PM By: [name] Entered By: [name] on 08/11/2022 14:04:59 [name], [name] ([id number]) [id number] Page 4 of 6 -------------------------------------------------------------------------------- Multi Wound Chart Details Patient Name: Date of Service: [name], [name] [name] 08/11/2022 1:30 PM Medical Record Number: [id number] Patient Account Number: [id number] Date of Birth/Sex: Treating RN: 17-Jun-1936 (86 y.o. M) Primary Care Zandra Lajeunesse: [name] Other Clinician: Referring Haunani Dickard: Treating Barrie Sigmund/Extender: [name] in Treatment: 0 Vital Signs Height(in): Pulse(bpm): 68 Weight(lbs): Blood Pressure(mmHg): 131/79 Body Mass Index(BMI): Temperature(F): 98.8 Respiratory Rate(breaths/min): 18 [Treatment Notes:Wound Assessments Treatment Notes] Electronic Signature(s) Signed: 08/11/2022 2:21:19 PM By: Fredirick Maudlin MD FACS Entered By: Fredirick Maudlin on 08/11/2022 14:21:18 -------------------------------------------------------------------------------- Multi-Disciplinary Care Plan  Details Patient Name: Date of Service: [name], [name] [name] 08/11/2022 1:30 PM Medical Record Number: [id number] Patient Account Number: [id number] Date of Birth/Sex: Treating RN: 10-24-1935 (86 y.o. [name] Primary Care Jorene Kaylor: [name] Other Clinician: Referring Norris Brumbach: Treating Felecity Lemaster/Extender: [name] in Treatment: 0 Active Inactive Electronic Signature(s) Signed: 08/11/2022 4:08:33 PM By: [name] By: [name] on 08/11/2022 14:47:19 -------------------------------------------------------------------------------- Pain Assessment Details Patient Name: Date of Service: [name], [name] [name] 08/11/2022 1:30 PM Medical Record Number: [id number] Patient Account Number: [id number] Date of Birth/Sex: Treating RN: [birth year] (86 y.o. [name] Primary Care Jamielee Mchale: [name] Other Clinician: Referring Megin Consalvo: Treating Deshonna Trnka/Extender: [name] in Treatment: [address] [name], [name] ([id number]) [id number] Page 5 of 6 Location of Pain Severity and Description of Pain Patient Has Paino No Site Locations Rate the pain. Current Pain Level: 0 Pain Management and Medication Current Pain  Management: Electronic Signature(s) Signed: 08/11/2022 4:08:33 PM By: [name] By: [name] on 08/11/2022 14:07:06 -------------------------------------------------------------------------------- Patient/Caregiver Education Details Patient Name: Date of Service: [name], [name] [name] 11/21/2023andnbsp1:30 PM Medical Record Number: [id number] Patient Account Number: [id number] Date of Birth/Gender: Treating RN: June 21, 1936 (86 y.o. [name] Primary Care Physician: [name] Other Clinician: Referring Physician: Treating Physician/Extender: [name] in Treatment: 0 Education Assessment Education  Provided To: Patient Education Topics Provided Pain: Methods: Explain/Verbal Responses: Reinforcements needed, State content correctly Electronic Signature(s) Signed: 08/11/2022 4:08:33 PM By: [name] Entered By: [name] on 08/11/2022 14:47:35 -------------------------------------------------------------------------------- [place] Details Patient Name: Date of Service: [name], [name] [name] 08/11/2022 1:30 PM [name] ([id number]) [id number] Page 6 of 6 Medical Record Number: [id number] Patient Account Number: [id number] Date of Birth/Sex: Treating RN: 05/20/1936 (86 y.o. [name] Primary Care Leatta Alewine: [name] Other Clinician: Referring Shamarion Coots: Treating Haim Hansson/Extender: [name] in Treatment: 0 Vital Signs Time Taken: 14:02 Temperature (F): 98.8 Pulse (bpm): 68 Respiratory Rate (breaths/min): 18 Blood Pressure (mmHg): 131/79 Reference Range: 80 - 120 mg / dl Electronic Signature(s) Signed: 08/11/2022 4:08:33 PM By: [name] Entered By: [name] on 08/11/2022 14:02:25

## 2022-08-20 ENCOUNTER — Telehealth: Payer: Self-pay

## 2022-08-20 NOTE — Telephone Encounter (Signed)
Pt's wife called regarding the Metoprolol. She wasn't sure when she was supposed to given the patient the medication. She said she has been giving him the medication with pulse is below 60. I did advise patient according to the sig pt was supposed to be taking a 1/2 tab daily. Pt's wife would like a call back for clarification.

## 2022-08-21 NOTE — Telephone Encounter (Signed)
12.5 mg daily is fine reviewing his chart.

## 2022-08-25 ENCOUNTER — Encounter (HOSPITAL_BASED_OUTPATIENT_CLINIC_OR_DEPARTMENT_OTHER): Payer: Medicare Other | Attending: General Surgery | Admitting: General Surgery

## 2022-08-25 DIAGNOSIS — S81801A Unspecified open wound, right lower leg, initial encounter: Secondary | ICD-10-CM | POA: Diagnosis not present

## 2022-08-25 DIAGNOSIS — Z955 Presence of coronary angioplasty implant and graft: Secondary | ICD-10-CM | POA: Insufficient documentation

## 2022-08-25 DIAGNOSIS — I10 Essential (primary) hypertension: Secondary | ICD-10-CM | POA: Diagnosis not present

## 2022-08-25 DIAGNOSIS — Z7901 Long term (current) use of anticoagulants: Secondary | ICD-10-CM | POA: Insufficient documentation

## 2022-08-25 DIAGNOSIS — I872 Venous insufficiency (chronic) (peripheral): Secondary | ICD-10-CM | POA: Insufficient documentation

## 2022-08-25 DIAGNOSIS — I480 Paroxysmal atrial fibrillation: Secondary | ICD-10-CM | POA: Diagnosis not present

## 2022-08-25 DIAGNOSIS — I2510 Atherosclerotic heart disease of native coronary artery without angina pectoris: Secondary | ICD-10-CM | POA: Insufficient documentation

## 2022-08-25 DIAGNOSIS — L988 Other specified disorders of the skin and subcutaneous tissue: Secondary | ICD-10-CM | POA: Diagnosis not present

## 2022-08-25 NOTE — Progress Notes (Signed)
[name] ([id number]) [id number] Page 1 of 10 Visit Report for 08/25/2022 Chief Complaint Document Details Patient Name: Date of Service: [name] 08/25/2022 12:45 PM Medical Record Number: [id number] Patient Account Number: [id number] Date of Birth/Sex: Treating RN: 04/27/1936 (86 y.o. M) Primary Care Provider: [name] Other Clinician: Referring Provider: Treating Provider/Extender: [name] in Treatment: 2 Information Obtained from: Patient Chief Complaint Left lower extremity wound Electronic Signature(s) Signed: 08/25/2022 1:15:18 PM By: Fredirick Maudlin MD FACS Entered By: Fredirick Maudlin on 08/25/2022 13:15:18 -------------------------------------------------------------------------------- Debridement Details Patient Name: Date of Service: [name] 08/25/2022 12:45 PM Medical Record Number: [id number] Patient Account Number: [id number] Date of Birth/Sex: Treating RN: June 29, 1936 (86 y.o. [name] Primary Care Provider: [name] Other Clinician: Referring Provider: Treating Provider/Extender: [name] in Treatment: 2 Debridement Performed for Assessment: Wound #3 Left,Posterior Lower Leg Performed By: Physician Fredirick Maudlin, MD Debridement Type: Debridement Level of Consciousness (Pre-procedure): Awake and Alert Pre-procedure Verification/Time Out Yes - 13:03 Taken: Start Time: 13:03 Pain Control: Lidocaine 5% topical ointment T Area Debrided (L x W): otal 0.3 (cm) x 0.3 (cm) = 0.09 (cm) Tissue and other material debrided: Non-Viable, Slough, Slough Level: Non-Viable Tissue Debridement Description: Selective/Open Wound Instrument: Curette Bleeding: Minimum Hemostasis Achieved: Pressure Response to Treatment: Procedure was tolerated well Level of Consciousness (Post- Awake and Alert procedure): Post Debridement Measurements of Total Wound Length:  (cm) 0.3 Width: (cm) 0.3 Depth: (cm) 0.2 Volume: (cm) 0.014 Character of Wound/Ulcer Post Debridement: Improved Post Procedure Diagnosis Same as [name], [name] ([id number]) [id number] Page 2 of 10 Notes scribed for Dr. Celine Ahr by [name], RN Electronic Signature(s) Signed: 08/25/2022 2:46:51 PM By: Fredirick Maudlin MD FACS Signed: 08/25/2022 4:17:50 PM By: [name] By: [name] on 08/25/2022 13:13:46 -------------------------------------------------------------------------------- HPI Details Patient Name: Date of Service: [name] 08/25/2022 12:45 PM Medical Record Number: [id number] Patient Account Number: [id number] Date of Birth/Sex: Treating RN: 1935/10/24 (86 y.o. M) Primary Care Provider: [name] Other Clinician: Referring Provider: Treating Provider/Extender: [name] in Treatment: 2 History of Present Illness HPI Description: [name] is an 86 year old male with a past medical history of CAD s/p DES to LCx 2006, carotid artery disease, hypertension, hyperlipidemia, paroxysmal atrial fibrillation on Eliquis and Cardizem that presents today for right lower extremity wound. He was seen by Dr. Larose Kells, his primary care provider on 4/29 for this issue and was referred to our clinic. Patient states that he had a small wound that spontaneously started in October 2021 and has not healed. He states this has progressively gotten larger. He has been using acne cream prescribed by the dermatologist for this issue. He reports drainage to the wound but no purulent drainage. He reports mild soreness to the wound. He has swelling in his right leg greater than left and reports this is a chronic issue for the past 1 to 2 years. He denies resting leg pain or pain with ambulation. Of note He was prescribed doxycycline at the end of last month and has finished this course for possible skin  infection to the wound site. 02/06/2021; I am seeing this patient who was admitted to the clinic last week by Dr. Heber [place]. He has predominantly I think a venous insufficiency wound on the right medial lower leg he has been using Santyl Hydrofera Blue under 3 layer compression. Arterial studies were ordered last week but do not seem to  been put through. He is not a diabetic. He did have venous reflux studies done in August 2020. He did have abnormal reflux time was noted in the great saphenous vein in the distal thigh and the great saphenous vein at the knee. There was no evidence of DVT or SVT at the time he was not felt to have large enough saphenous veins on either side for intervention. Compression stockings at least knee-high were recommended. Follow-up was on a as needed basis with Dr. Donzetta Matters The patient does not describe claudication but his pulses in his feet are not vibrant this could be because of the swelling 5/26; patient presents for 1 week follow-up. He has been using Hydrofera Blue under 3 layer compression. He had arterial studies done. He has no issues or complaints today. He denies signs of infection. He has his juxta light compressions with him today. 6/3; patient presents for 1 week follow-up. He has been using Hydrofera Blue under 3 layer compression. He denies signs and symptoms of infection. He did have bright green drainage which he states he has not seen before when the dressing was taken off today. He is using his juxta light compression to the left leg. 6/10; patient presents for 1 week follow-up. He has been tolerating Hydrofera Blue with 3 layer compression. He again reports bright green drainage. However he denies any signs of infection. He has been using juxta light compression to the left leg. 6/24; patient presents for 2-week follow-up. He has been tolerating Hydrofera Blue with 3 layer compression. Today he is healed. He brought his juxta lite compression for the right  leg and continues to wear the juxta light compression on the left leg READMISSION 05/05/2022 The patient returns to clinic today with a new wound on his left posterior leg. He has been wearing his juxta lite stockings on a regular basis, but on exam he still has 3+ pitting edema. I suspect he has lymphedema in addition to his known venous reflux. The wounds are scattered geographic wounds with light slough accumulation. They have been applying mupirocin and CeraVe ointment. He does have a VP shunt placement scheduled for next week, but it sounds like his neurosurgeon is not aware of the fact that he has an open wound. 05/11/2022: His VP shunt placement has been postponed while we get his wound to heal. The wounds are all smaller today with just a little eschar and minimal slough. Edema control is good. No concern for infection. 05/18/2022: All of the open areas have contracted and there is good perimeter epithelialization. There is a little bit of eschar and slough on the open areas. Good edema control. No concern for infection. 05/26/2022: Many of the small open areas have closed. There is Hydrofera Blue sponge stuck in a number of places and this is unable to be removed by the intake nurse. There is a fair amount of eschar and a little bit of slough present. 06/02/2022: Continued closure of small open skin sites. There are just a couple remaining and these have a little eschar on the surface. Edema control is excellent. 06/09/2022: We are down to just 1 small open area. There is some eschar and slough present. No concern for infection. Edema control is excellent. 06/16/2022: His wound is healed. READMISSION 08/11/2022 Mr. Short underwent a successful VP shunt placement. He returns to clinic today because of a suspicious lesion on his left posterior calf. He actually has no open wounds and his edema control is excellent. He is  wearing his juxta lite stockings religiously. [name], [name] ([id number])  [id number] Page 3 of 10 On his left posterior calf, there is a raised scaly lesion concerning for potential squamous cell carcinoma. 08/25/2022: The biopsy that I took at his last visit was consistent with a well-differentiated squamous cell carcinoma. He has an appointment at the skin surgery center on December 14. The wound from the biopsy has some slough accumulation, but no concern for infection. Electronic Signature(s) Signed: 08/25/2022 1:19:12 PM By: Fredirick Maudlin MD FACS Entered By: Fredirick Maudlin on 08/25/2022 13:19:12 -------------------------------------------------------------------------------- Physical Exam Details Patient Name: Date of Service: [name] 08/25/2022 12:45 PM Medical Record Number: [id number] Patient Account Number: [id number] Date of Birth/Sex: Treating RN: [birth year] (86 y.o. M) Primary Care Provider: [name] Other Clinician: Referring Provider: Treating Provider/Extender: [name] in Treatment: 2 Constitutional . . . . No acute distress. Respiratory Normal work of breathing on room air. Notes 08/25/2022: The wound from the biopsy has some slough accumulation, but no concern for infection. Electronic Signature(s) Signed: 08/25/2022 1:20:14 PM By: Fredirick Maudlin MD FACS Entered By: Fredirick Maudlin on 08/25/2022 13:20:13 -------------------------------------------------------------------------------- Physician Orders Details Patient Name: Date of Service: [name] 08/25/2022 12:45 PM Medical Record Number: [id number] Patient Account Number: [id number] Date of Birth/Sex: Treating RN: [birth year] (86 y.o. [name] Primary Care Provider: [name] Other Clinician: Referring Provider: Treating Provider/Extender: [name] in Treatment: 2 Verbal / Phone Orders: No Diagnosis Coding ICD-10 Coding Code Description L98.9 Disorder of the skin and  subcutaneous tissue, unspecified I87.2 Venous insufficiency (chronic) (peripheral) I10 Essential (primary) hypertension Follow-up Appointments ppointment in 2 weeks. - Dr. Celine Ahr - room 2 Return A Anesthetic (In clinic) Topical Lidocaine 5% applied to wound bed [address] [place], [place] ([id number]) [phone].pdf Page 4 of 10 May shower and wash wound with soap and water. Edema Control - Lymphedema / SCD / Other Elevate legs to the level of the heart or above for 30 minutes daily and/or when sitting, a frequency of: - throughout the day Avoid standing for long periods of time. Exercise regularly - including ankle circles while sitting Moisturize legs daily. - both legs nightly after removing juxtalites Compression stocking or Garment 20-30 mm/Hg pressure to: - both legs daily Wound Treatment Wound #3 - Lower Leg Wound Laterality: Left, Posterior Cleanser: Soap and Water 1 x Per Day/30 Days Discharge Instructions: May shower and wash wound with dial antibacterial soap and water prior to dressing change. Cleanser: Wound Cleanser 1 x Per Day/30 Days Discharge Instructions: Cleanse the wound with wound cleanser prior to applying a clean dressing using gauze sponges, not tissue or cotton balls. Topical: Mupirocin Ointment 1 x Per Day/30 Days Discharge Instructions: Apply Mupirocin (Bactroban) as instructed Secondary Dressing: Zetuvit Plus Silicone Border Dressing 4x4 (in/in) 1 x Per Day/30 Days Discharge Instructions: Apply silicone border over primary dressing as directed. Patient Medications llergies: No Known Allergies A Notifications Medication Indication Start End 08/25/2022 lidocaine DOSE topical 5 % ointment - ointment topical Electronic Signature(s) Signed: 08/25/2022 2:46:51 PM By: Fredirick Maudlin MD FACS Entered By: Fredirick Maudlin on 08/25/2022  13:20:26 -------------------------------------------------------------------------------- Problem List Details Patient Name: Date of Service: [name] 08/25/2022 12:45 PM Medical Record Number: [id number] Patient Account Number: [id number] Date of Birth/Sex: Treating RN: 19-Dec-1935 (86 y.o. M) Primary Care Provider: [name] Other Clinician: Referring Provider: Treating Provider/Extender: [name] in Treatment: 2 Active Problems ICD-10  Encounter Code Description Active Date MDM Diagnosis L98.9 Disorder of the skin and subcutaneous tissue, unspecified 08/11/2022 No Yes I87.2 Venous insufficiency (chronic) (peripheral) 08/11/2022 No Yes I10 Essential (primary) hypertension 08/11/2022 No Yes Inactive Problems Resolved Problems KENNY, REA ([id number]) [id number] Page 5 of 10 Electronic Signature(s) Signed: 08/25/2022 1:15:07 PM By: Fredirick Maudlin MD FACS Entered By: Fredirick Maudlin on 08/25/2022 13:15:07 -------------------------------------------------------------------------------- Progress Note Details Patient Name: Date of Service: [name] 08/25/2022 12:45 PM Medical Record Number: [id number] Patient Account Number: [id number] Date of Birth/Sex: Treating RN: [birth year] (86 y.o. M) Primary Care Provider: [name] Other Clinician: Referring Provider: Treating Provider/Extender: [name] in Treatment: 2 Subjective Chief Complaint Information obtained from Patient Left lower extremity wound History of Present Illness (HPI) [name] is an 86 year old male with a past medical history of CAD s/p DES to LCx 2006, carotid artery disease, hypertension, hyperlipidemia, paroxysmal atrial fibrillation on Eliquis and Cardizem that presents today for right lower extremity wound. He was seen by Dr. Larose Kells, his primary care provider on 4/29 for this issue and was referred to our  clinic. Patient states that he had a small wound that spontaneously started in October 2021 and has not healed. He states this has progressively gotten larger. He has been using acne cream prescribed by the dermatologist for this issue. He reports drainage to the wound but no purulent drainage. He reports mild soreness to the wound. He has swelling in his right leg greater than left and reports this is a chronic issue for the past 1 to 2 years. He denies resting leg pain or pain with ambulation. Of note He was prescribed doxycycline at the end of last month and has finished this course for possible skin infection to the wound site. 02/06/2021; I am seeing this patient who was admitted to the clinic last week by Dr. Heber [place]. He has predominantly I think a venous insufficiency wound on the right medial lower leg he has been using Santyl Hydrofera Blue under 3 layer compression. Arterial studies were ordered last week but do not seem to been put through. He is not a diabetic. He did have venous reflux studies done in August 2020. He did have abnormal reflux time was noted in the great saphenous vein in the distal thigh and the great saphenous vein at the knee. There was no evidence of DVT or SVT at the time he was not felt to have large enough saphenous veins on either side for intervention. Compression stockings at least knee-high were recommended. Follow-up was on a as needed basis with Dr. Donzetta Matters The patient does not describe claudication but his pulses in his feet are not vibrant this could be because of the swelling 5/26; patient presents for 1 week follow-up. He has been using Hydrofera Blue under 3 layer compression. He had arterial studies done. He has no issues or complaints today. He denies signs of infection. He has his juxta light compressions with him today. 6/3; patient presents for 1 week follow-up. He has been using Hydrofera Blue under 3 layer compression. He denies signs and symptoms of  infection. He did have bright green drainage which he states he has not seen before when the dressing was taken off today. He is using his juxta light compression to the left leg. 6/10; patient presents for 1 week follow-up. He has been tolerating Hydrofera Blue with 3 layer compression. He again reports bright green drainage. However he denies any signs of infection. He  has been using juxta light compression to the left leg. 6/24; patient presents for 2-week follow-up. He has been tolerating Hydrofera Blue with 3 layer compression. Today he is healed. He brought his juxta lite compression for the right leg and continues to wear the juxta light compression on the left leg READMISSION 05/05/2022 The patient returns to clinic today with a new wound on his left posterior leg. He has been wearing his juxta lite stockings on a regular basis, but on exam he still has 3+ pitting edema. I suspect he has lymphedema in addition to his known venous reflux. The wounds are scattered geographic wounds with light slough accumulation. They have been applying mupirocin and CeraVe ointment. He does have a VP shunt placement scheduled for next week, but it sounds like his neurosurgeon is not aware of the fact that he has an open wound. 05/11/2022: His VP shunt placement has been postponed while we get his wound to heal. The wounds are all smaller today with just a little eschar and minimal slough. Edema control is good. No concern for infection. 05/18/2022: All of the open areas have contracted and there is good perimeter epithelialization. There is a little bit of eschar and slough on the open areas. Good edema control. No concern for infection. 05/26/2022: Many of the small open areas have closed. There is Hydrofera Blue sponge stuck in a number of places and this is unable to be removed by the intake nurse. There is a fair amount of eschar and a little bit of slough present. 06/02/2022: Continued closure of small open  skin sites. There are just a couple remaining and these have a little eschar on the surface. Edema control is excellent. 06/09/2022: We are down to just 1 small open area. There is some eschar and slough present. No concern for infection. Edema control is excellent. 06/16/2022: His wound is healed. [name], [name] ([id number]) [id number] Page 6 of 10 08/11/2022 Mr. Short underwent a successful VP shunt placement. He returns to clinic today because of a suspicious lesion on his left posterior calf. He actually has no open wounds and his edema control is excellent. He is wearing his juxta lite stockings religiously. On his left posterior calf, there is a raised scaly lesion concerning for potential squamous cell carcinoma. 08/25/2022: The biopsy that I took at his last visit was consistent with a well-differentiated squamous cell carcinoma. He has an appointment at the skin surgery center on December 14. The wound from the biopsy has some slough accumulation, but no concern for infection. Patient History Information obtained from Patient. Family History Cancer - Father, Diabetes - Maternal Grandparents, Heart Disease - Maternal Grandparents, Hypertension - Maternal Grandparents, No family history of Hereditary Spherocytosis, Kidney Disease, Lung Disease, Seizures, Stroke, Thyroid Problems, Tuberculosis. Social History Former smoker - Quit over 30 years ago, Marital Status - Married, Alcohol Use - Rarely, Drug Use - No History, Caffeine Use - Daily. Medical History Eyes Patient has history of Glaucoma Cardiovascular Patient has history of Arrhythmia - afib, Coronary Artery Disease, Hypertension, Peripheral Venous Disease Endocrine Denies history of Type I Diabetes, Type II Diabetes Genitourinary Denies history of End Stage Renal Disease Integumentary (Skin) Denies history of History of Burn Musculoskeletal Patient has history of  Osteoarthritis Oncologic Denies history of Received Chemotherapy, Received Radiation Psychiatric Denies history of Anorexia/bulimia, Confinement Anxiety Hospitalization/Surgery History - lumbar laminectomy. - total knee arthropathy right. - bil total hip arthroplasty. - coronary stents. - toe surgery. - tonsillectomy. - shunt  insertion. Medical A Surgical History Notes nd Gastrointestinal colon polyps Endocrine Hypothyroidism Genitourinary BPH Musculoskeletal Bilat Hip Replacements, Right Knee Replacement, lumbar stenosis Neurologic hydrocephalus Oncologic Skin Cancer multiple sites Objective Constitutional No acute distress. Vitals Time Taken: 12:51 PM, Temperature: 97.5 F, Pulse: 76 bpm, Respiratory Rate: 18 breaths/min, Blood Pressure: 131/82 mmHg. Respiratory Normal work of breathing on room air. General Notes: 08/25/2022: The wound from the biopsy has some slough accumulation, but no concern for infection. Integumentary (Hair, Skin) Wound #3 status is Open. Original cause of wound was Puncture. The date acquired was: 08/12/2022. The wound is located on the Left,Posterior Lower Leg. The wound measures 0.3cm length x 0.3cm width x 0.2cm depth; 0.071cm^2 area and 0.014cm^3 volume. There is Fat Layer (Subcutaneous Tissue) exposed. There is no tunneling or undermining noted. There is a medium amount of serosanguineous drainage noted. The wound margin is distinct with the outline attached to the wound base. There is small (1-33%) red granulation within the wound bed. There is a large (67-100%) amount of necrotic tissue within the wound bed including Eschar and Adherent Slough. The periwound skin appearance had no abnormalities noted for texture. The periwound skin appearance had no abnormalities noted for moisture. The periwound skin appearance exhibited: Hemosiderin Staining. Periwound temperature was noted as No Abnormality. [name], [name] ([id number])  [id number] Page 7 of 10 Assessment Active Problems ICD-10 Disorder of the skin and subcutaneous tissue, unspecified Venous insufficiency (chronic) (peripheral) Essential (primary) hypertension Procedures Wound #3 Pre-procedure diagnosis of Wound #3 is a Lesion located on the Left,Posterior Lower Leg . There was a Selective/Open Wound Non-Viable Tissue Debridement with a total area of 0.09 sq cm performed by Fredirick Maudlin, MD. With the following instrument(s): Curette to remove Non-Viable tissue/material. Material removed includes [facility] after achieving pain control using Lidocaine 5% topical ointment. No specimens were taken. A time out was conducted at 13:03, prior to the start of the procedure. A Minimum amount of bleeding was controlled with Pressure. The procedure was tolerated well. Post Debridement Measurements: 0.3cm length x 0.3cm width x 0.2cm depth; 0.014cm^3 volume. Character of Wound/Ulcer Post Debridement is improved. Post procedure Diagnosis Wound #3: Same as Pre-Procedure General Notes: scribed for Dr. Celine Ahr by [name], RN. Plan Follow-up Appointments: Return Appointment in 2 weeks. - Dr. Celine Ahr - room 2 Anesthetic: (In clinic) Topical Lidocaine 5% applied to wound bed Bathing/ Shower/ Hygiene: May shower and wash wound with soap and water. Edema Control - Lymphedema / SCD / Other: Elevate legs to the level of the heart or above for 30 minutes daily and/or when sitting, a frequency of: - throughout the day Avoid standing for long periods of time. Exercise regularly - including ankle circles while sitting Moisturize legs daily. - both legs nightly after removing juxtalites Compression stocking or Garment 20-30 mm/Hg pressure to: - both legs daily The following medication(s) was prescribed: lidocaine topical 5 % ointment ointment topical was prescribed at facility WOUND #3: - Lower Leg Wound Laterality: Left, Posterior Cleanser:  Soap and Water 1 x Per Day/30 Days Discharge Instructions: May shower and wash wound with dial antibacterial soap and water prior to dressing change. Cleanser: Wound Cleanser 1 x Per Day/30 Days Discharge Instructions: Cleanse the wound with wound cleanser prior to applying a clean dressing using gauze sponges, not tissue or cotton balls. Topical: Mupirocin Ointment 1 x Per Day/30 Days Discharge Instructions: Apply Mupirocin (Bactroban) as instructed Secondary Dressing: Zetuvit Plus Silicone Border Dressing 4x4 (in/in) 1 x Per Day/30 Days Discharge Instructions: Apply  silicone border over primary dressing as directed. 08/25/2022: The wound from the biopsy has some slough accumulation, but no concern for infection. I used a curette to debride the slough from the wound surface. We will apply mupirocin and a foam border dressing here in clinic. They can just use over-the- counter triple antibiotic ointment and a Band-Aid at home. He will see the skin surgeon on the 14th. If he needs any further follow-up with [place], I will plan to see him back in 2 weeks; they can cancel that appointment if he does not require further treatment here. Electronic Signature(s) Signed: 08/25/2022 1:24:02 PM By: Fredirick Maudlin MD FACS Entered By: Fredirick Maudlin on 08/25/2022 13:24:02 -------------------------------------------------------------------------------- HxROS Details Patient Name: Date of Service: [name] 08/25/2022 12:45 PM Medical Record Number: [id number] Patient Account Number: [id number] Date of Birth/Sex: Treating RN: [birth year] (86 y.o. ROLF, FELLS [name] ([id number]) [id number] Page 8 of 10 Primary Care Provider: [name] Other Clinician: Referring Provider: Treating Provider/Extender: [name] in Treatment: 2 Information Obtained From Patient Eyes Medical History: Positive for: Glaucoma Cardiovascular Medical History: Positive  for: Arrhythmia - afib; Coronary Artery Disease; Hypertension; Peripheral Venous Disease Gastrointestinal Medical History: Past Medical History Notes: colon polyps Endocrine Medical History: Negative for: Type I Diabetes; Type II Diabetes Past Medical History Notes: Hypothyroidism Genitourinary Medical History: Negative for: End Stage Renal Disease Past Medical History Notes: BPH Integumentary (Skin) Medical History: Negative for: History of Burn Musculoskeletal Medical History: Positive for: Osteoarthritis Past Medical History Notes: Bilat Hip Replacements, Right Knee Replacement, lumbar stenosis Neurologic Medical History: Past Medical History Notes: hydrocephalus Oncologic Medical History: Negative for: Received Chemotherapy; Received Radiation Past Medical History Notes: Skin Cancer multiple sites Psychiatric Medical History: Negative for: Anorexia/bulimia; Confinement Anxiety HBO Extended History Items Eyes: Glaucoma Immunizations Pneumococcal Vaccine: Received Pneumococcal Vaccination: Yes Received Pneumococcal Vaccination On or After 60th Birthday: Yes Implantable [address] YAW, ESCOTO ([id number]) [id number] Page 9 of 10 None Hospitalization / Surgery History Type of Hospitalization/Surgery lumbar laminectomy total knee arthropathy right bil total hip arthroplasty coronary stents toe surgery tonsillectomy shunt insertion Family and Social History Cancer: Yes - Father; Diabetes: Yes - Maternal Grandparents; Heart Disease: Yes - Maternal Grandparents; Hereditary Spherocytosis: No; Hypertension: Yes - Maternal Grandparents; Kidney Disease: No; Lung Disease: No; Seizures: No; Stroke: No; Thyroid Problems: No; Tuberculosis: No; Former smoker - Quit over 30 years ago; Marital Status - Married; Alcohol Use: Rarely; Drug Use: No History; Caffeine Use: Daily; Financial Concerns: No; Food, Clothing or Shelter Needs: No; Support  System Lacking: No; Transportation Concerns: No Engineer, maintenance) Signed: 08/25/2022 2:46:51 PM By: Fredirick Maudlin MD FACS Entered By: Fredirick Maudlin on 08/25/2022 13:19:17 -------------------------------------------------------------------------------- SuperBill Details Patient Name: Date of Service: [name] 08/25/2022 Medical Record Number: [id number] Patient Account Number: [id number] Date of Birth/Sex: Treating RN: 01-24-1936 (86 y.o. M) Primary Care Provider: [name] Other Clinician: Referring Provider: Treating Provider/Extender: [name] in Treatment: 2 Diagnosis Coding ICD-10 Codes Code Description L98.9 Disorder of the skin and subcutaneous tissue, unspecified I87.2 Venous insufficiency (chronic) (peripheral) I10 Essential (primary) hypertension Facility Procedures : CPT4 Code: [id number] Description: [phone] - DEBRIDE WOUND 1ST 20 SQ CM OR < ICD-10 Diagnosis Description L98.9 Disorder of the skin and subcutaneous tissue, unspecified Modifier: Quantity: 1 Physician Procedures : CPT4 Code Description Modifier [id number] - WC PHYS LEVEL 4 - EST PT 25 ICD-10 Diagnosis Description L98.9 Disorder of the skin and subcutaneous tissue, unspecified I87.2 Venous insufficiency (  chronic) (peripheral) I10 Essential (primary)  hypertension Quantity: 1 : [id number] - WC PHYS DEBR WO ANESTH 20 SQ CM ICD-10 Diagnosis Description L98.9 Disorder of the skin and subcutaneous tissue, unspecified Quantity: 1 Electronic Signature(s) Signed: 08/25/2022 1:24:20 PM By: Fredirick Maudlin MD FACS Entered By: Fredirick Maudlin on 08/25/2022 13:24:19 [place], Wallace Keller ([id number]) [id number] Page 10 of 10

## 2022-08-25 NOTE — Telephone Encounter (Signed)
Pt called. LDVM 

## 2022-08-25 NOTE — Progress Notes (Signed)
IVIE, MAESE ([id number]) [id number] Page 1 of 7 Visit Report for 08/25/2022 Arrival Information Details Patient Name: Date of Service: [name],  RD [name] 08/25/2022 12:45 PM Medical Record Number: [id number] Patient Account Number: [id number] Date of Birth/Sex: Treating RN: [name] (86 y.o. [name] Primary Care Annye [name]: [name] Other Clinician: Referring [name]: Treating [name]/Extender: [name] in [name]: 2 Visit Information History Since Last Visit Added or deleted any medications: No Patient Arrived: [name] Any new allergies or adverse reactions: No Arrival Time: 12:50 Had a fall or experienced change in No Accompanied By: WIFE activities of daily living that [name] affect Transfer Assistance: None risk of falls: Patient Identification Verified: Yes Signs or symptoms of abuse/neglect since last visito No Secondary Verification Process Completed: Yes Hospitalized since last visit: No Implantable device outside of the clinic excluding No cellular tissue based products placed in the center since last visit: Has Dressing in Place as Prescribed: Yes Pain Present Now: No Electronic Signature(s) Signed: 08/25/2022 4:17:50 PM By: [name] Entered By: [name] on 08/25/2022 12:51:14 -------------------------------------------------------------------------------- Encounter Discharge Information Details Patient Name: Date of Service: [name], [name] 08/25/2022 12:45 PM Medical Record Number: [id number] Patient Account Number: [id number] Date of Birth/Sex: Treating RN: [name] (86 y.o. [name] Primary Care Makhiya Coburn: [name] Other Clinician: Referring Averi Cacioppo: Treating Dennies Coate/Extender: [name] in [name]: 2 Encounter Discharge Information Items Post Procedure Vitals Discharge Condition: Stable Temperature (F): 97.5 Ambulatory Status:  Cane Pulse (bpm): 76 Discharge Destination: Home Respiratory Rate (breaths/min): 18 Transportation: Private Auto Blood Pressure (mmHg): 131/82 Accompanied By: wife Schedule Follow-up Appointment: Yes Clinical Summary of Care: Patient Declined Electronic Signature(s) Signed: 08/25/2022 4:17:50 PM By: [name] Entered By: [name] on 08/25/2022 13:15:12 [place], [name] ([id number]) [id number] Page 2 of 7 -------------------------------------------------------------------------------- Lower Extremity Assessment Details Patient Name: Date of Service: [name], [name] 08/25/2022 12:45 PM Medical Record Number: [id number] Patient Account Number: [id number] Date of Birth/Sex: Treating RN: [name] (86 y.o. [name] Primary Care Naia Ruff: [name] Other Clinician: Referring [name]: Treating Kalea Perine/Extender: [name]: 2 Edema Assessment Assessed: [Left: No] [Right: No] [Left: Edema] [Right: :] Calf Left: Right: Point of Measurement: From Medial Instep 38 cm Ankle Left: Right: Point of Measurement: From Medial Instep 25 cm Vascular Assessment Pulses: Dorsalis Pedis Palpable: [Left:Yes] Electronic Signature(s) Signed: 08/25/2022 4:17:50 PM By: [name] Entered By: [name] on 08/25/2022 12:53:54 -------------------------------------------------------------------------------- Multi Wound Chart Details Patient Name: Date of Service: [name], [name] 08/25/2022 12:45 PM Medical Record Number: [id number] Patient Account Number: [id number] Date of Birth/Sex: Treating RN: [birth year] (86 y.o. M) Primary Care [name]: [name] Other Clinician: Referring [name]: Treating [name]/Extender: [name] in [name]: 2 Vital Signs Height(in): Pulse(bpm): 69 Weight(lbs): Blood Pressure(mmHg): 131/82 Body Mass Index(BMI): Temperature(F):  97.5 Respiratory Rate(breaths/min): 18 [3:Photos: No Photos Left, Posterior Lower Leg Wound Location: Puncture Wounding Event: Lesion Primary Etiology: Glaucoma, Arrhythmia, Coronary Comorbid History: Artery Disease, Hypertension, Peripheral Venous Disease, Osteoarthritis 08/12/2022 Date Acquired:]  [N/A:N/A N/A N/A N/A N/A N/A] [name] ([name]) [3:0 Weeks of [name]: Open Wound Status: No Wound Recurrence: 0.3x0.3x0.2 Measurements L x W x D (cm) 0.071 A (cm) : rea 0.014 Volume (cm) : Full Thickness Without Exposed Classification: Support Structures Medium Exudate A mount: Serosanguineous  Exudate Type: red, brown Exudate Color: Distinct, outline attached Wound Margin: Small (1-33%) Granulation A mount: Red Granulation Quality: Large (67-100%) Necrotic  A mount: Eschar, Adherent Slough Necrotic Tissue: Fat Layer (Subcutaneous Tissue): Yes  N/A Exposed Structures: Fascia: No Tendon: No Muscle: No Joint: No Bone: No None Epithelialization: Debridement - Selective/Open Wound N/A Debridement: Pre-procedure Verification/Time Out 13:03 Taken: Lidocaine 5% topical ointment Pain Control: Slough  Tissue Debrided: Non-Viable Tissue Level: 0.09 Debridement A (sq cm): rea Curette Instrument: Minimum Bleeding: Pressure Hemostasis A chieved: Procedure was tolerated well Debridement [name] Response: 0.3x0.3x0.2 Post Debridement Measurements L x W x  D (cm) 0.014 Post Debridement Volume: (cm) No Abnormalities Noted Periwound Skin Texture: No Abnormalities Noted Periwound Skin Moisture: Hemosiderin Staining: Yes Periwound Skin Color: No Abnormality Temperature: Debridement Procedures Performed:]  [N/A:N/A N/A N/A N/A N/A N/A N/A N/A N/A N/A N/A N/A N/A N/A N/A N/A N/A N/A N/A N/A N/A N/A N/A N/A N/A N/A N/A N/A N/A N/A N/A N/A] [name] Notes Wound #3 (Lower Leg) Wound Laterality: Left, Posterior Cleanser Soap and Water Discharge Instruction: [name] shower and wash wound with dial antibacterial soap  and water prior to dressing change. Wound Cleanser Discharge Instruction: Cleanse the wound with wound cleanser prior to applying a clean dressing using gauze sponges, not tissue or cotton balls. Peri-Wound Care Topical Mupirocin Ointment Discharge Instruction: Apply Mupirocin (Bactroban) as instructed Primary Dressing Secondary Dressing Zetuvit Plus Silicone Border Dressing 4x4 (in/in) Discharge Instruction: Apply silicone border over primary dressing as directed. Secured With Compression Wrap Compression Stockings Environmental education officer) Signed: 08/25/2022 1:15:12 PM By: Fredirick Maudlin MD FACS Entered By: Fredirick Maudlin on 08/25/2022 13:15:11 JOBANI, SABADO [name] ([id number]) [id number] Page 4 of 7 -------------------------------------------------------------------------------- Multi-Disciplinary Care Plan Details Patient Name: Date of Service: [name], [name] 08/25/2022 12:45 PM Medical Record Number: [id number] Patient Account Number: [id number] Date of Birth/Sex: Treating RN: [birth year], [name] (86 y.o. [name] Primary Care Samatha Anspach: [name] Other Clinician: Referring Damacio Weisgerber: Treating Adel Neyer/Extender: [name] in [name]: 2 Active Inactive Necrotic Tissue Nursing Diagnoses: Impaired tissue integrity related to necrotic/devitalized tissue Knowledge deficit related to management of necrotic/devitalized tissue Goals: Necrotic/devitalized tissue will be minimized in the wound bed Date Initiated: 08/25/2022 Target Resolution Date: 10/23/2022 Goal Status: Active Patient/caregiver will verbalize understanding of reason and process for debridement of necrotic tissue Date Initiated: 08/25/2022 Target Resolution Date: 10/30/2022 Goal Status: Active Interventions: Assess patient pain level pre-, during and post procedure and prior to discharge Provide education on necrotic tissue and debridement  process [name] Activities: Apply topical anesthetic as ordered : 08/25/2022 Notes: Electronic Signature(s) Signed: 08/25/2022 4:17:50 PM By: [name] Entered By: [name] on 08/25/2022 13:14:41 -------------------------------------------------------------------------------- Pain Assessment Details Patient Name: Date of Service: [name], [name] 08/25/2022 12:45 PM Medical Record Number: [id number] Patient Account Number: [id number] Date of Birth/Sex: Treating RN: [birth year][name] (86 y.o. [name] Primary Care Leandro Berkowitz: [name] Other Clinician: Referring Mekhai Venuto: Treating Basil Buffin/Extender: [name] in [name]: 2 Active Problems Location of Pain Severity and Description of Pain Patient Has Paino No Site Locations Rate the pain. JEREMIA, GROOT ([id number]) [id number] Page 5 of 7 Rate the pain. Current Pain Level: 0 Pain Management and Medication Current Pain Management: Electronic Signature(s) Signed: 08/25/2022 4:17:50 PM By: [name] Entered By: [name] on 08/25/2022 12:51:38 -------------------------------------------------------------------------------- Patient/Caregiver Education Details Patient Name: Date of Service: [name], [name] 12/5/2023andnbsp12:45 PM Medical Record Number: [id number] Patient Account Number: [id number] Date of Birth/Gender: Treating RN: [birth year][name] (86 y.o. [name] Primary Care Physician: [name] Other Clinician: Referring Physician: Treating Physician/Extender: Tonette Lederer,  Jose Weeks in [name]: 2 Education Assessment Education Provided To: Patient Education Topics Provided Wound Debridement: Methods: Explain/Verbal Responses: Reinforcements needed, State content correctly Electronic Signature(s) Signed: 08/25/2022 4:17:50 PM By: [name] Entered By: [name] on 08/25/2022  13:03:12 -------------------------------------------------------------------------------- Wound Assessment Details Patient Name: Date of Service: [name], [name] 08/25/2022 12:45 PM Medical Record Number: [id number] Patient Account Number: [id number] Date of Birth/Sex: Treating RN: [name][birth year] (86 y.o. [name] Primary Care Joniel Graumann: [name] Other Clinician: Referring Hiroki Wint: Treating Rahim Astorga/Extender: [name] [place], [place] ([id number]) [id number] Page 6 of 7 Weeks in [name]: 2 Wound Status Wound Number: 3 Primary Lesion Etiology: Wound Location: Left, Posterior Lower Leg Wound Open Wounding Event: Puncture Status: Date Acquired: 08/12/2022 Comorbid Glaucoma, Arrhythmia, Coronary Artery Disease, Hypertension, Weeks Of [name]: 0 History: Peripheral Venous Disease, Osteoarthritis Clustered Wound: No Wound Measurements Length: (cm) 0.3 Width: (cm) 0.3 Depth: (cm) 0.2 Area: (cm) 0.071 Volume: (cm) 0.014 % Reduction in Area: % Reduction in Volume: Epithelialization: None Tunneling: No Undermining: No Wound Description Classification: Full Thickness Without Exposed Support Structures Wound Margin: Distinct, outline attached Exudate Amount: Medium Exudate Type: Serosanguineous Exudate Color: red, brown Foul Odor After Cleansing: No Slough/Fibrino Yes Wound Bed Granulation Amount: Small (1-33%) Exposed Structure Granulation Quality: Red Fascia Exposed: No Necrotic Amount: Large (67-100%) Fat Layer (Subcutaneous Tissue) Exposed: Yes Necrotic Quality: Eschar, Adherent Slough Tendon Exposed: No Muscle Exposed: No Joint Exposed: No Bone Exposed: No Periwound Skin Texture Texture Color No Abnormalities Noted: Yes No Abnormalities Noted: No Hemosiderin Staining: Yes Moisture No Abnormalities Noted: Yes Temperature / Pain Temperature: No Abnormality [name] Notes Wound #3 (Lower Leg) Wound  Laterality: Left, Posterior Cleanser Soap and Water Discharge Instruction: [name] shower and wash wound with dial antibacterial soap and water prior to dressing change. Wound Cleanser Discharge Instruction: Cleanse the wound with wound cleanser prior to applying a clean dressing using gauze sponges, not tissue or cotton balls. Peri-Wound Care Topical Mupirocin Ointment Discharge Instruction: Apply Mupirocin (Bactroban) as instructed Primary Dressing Secondary Dressing Zetuvit Plus Silicone Border Dressing 4x4 (in/in) Discharge Instruction: Apply silicone border over primary dressing as directed. Secured With Compression Wrap Compression Stockings Add-Ons Electronic Signature(s) Signed: 08/25/2022 4:17:50 PM By: [name] Entered By: [name] on 08/25/2022 13:02:09 YONG, GRIESER [name] ([id number]) [id number] Page 7 of 7 -------------------------------------------------------------------------------- Vitals Details Patient Name: Date of Service: [name], [name] 08/25/2022 12:45 PM Medical Record Number: [id number] Patient Account Number: [id number] Date of Birth/Sex: Treating RN: [name][birth year] (86 y.o. [name] Primary Care Damonica Chopra: [name] Other Clinician: Referring Admiral Marcucci: Treating Reinaldo Helt/Extender: [name] in [name]: 2 Vital Signs Time Taken: 12:51 Temperature (F): 97.5 Pulse (bpm): 76 Respiratory Rate (breaths/min): 18 Blood Pressure (mmHg): 131/82 Reference Range: 80 - 120 mg / dl Electronic Signature(s) Signed: 08/25/2022 4:17:50 PM By: [name] Entered By: [name] on 08/25/2022 12:51:31

## 2022-08-27 ENCOUNTER — Encounter: Payer: Self-pay | Admitting: Physical Therapy

## 2022-08-27 ENCOUNTER — Ambulatory Visit: Payer: Medicare Other | Attending: Neurosurgery | Admitting: Physical Therapy

## 2022-08-27 ENCOUNTER — Other Ambulatory Visit: Payer: Self-pay

## 2022-08-27 DIAGNOSIS — G910 Communicating hydrocephalus: Secondary | ICD-10-CM | POA: Diagnosis not present

## 2022-08-27 DIAGNOSIS — M6281 Muscle weakness (generalized): Secondary | ICD-10-CM | POA: Diagnosis not present

## 2022-08-27 DIAGNOSIS — R2689 Other abnormalities of gait and mobility: Secondary | ICD-10-CM | POA: Diagnosis not present

## 2022-08-27 NOTE — Therapy (Signed)
OUTPATIENT PHYSICAL THERAPY THORACOLUMBAR EVALUATION   Patient Name: NYAIR DEPAULO MRN: [id number] DOB:08/22/1936, 86 y.o., male Today's Date: 08/27/2022  END OF SESSION:  PT End of Session - 08/27/22 1558     Visit Number 1    Date for PT Re-Evaluation 10/22/22    Authorization Type [employer] Medicare    Progress Note Due on Visit 10    PT Start Time 1445    PT Stop Time 1528    PT Time Calculation (min) 43 min    Activity Tolerance Patient tolerated treatment well    Behavior During Therapy [facility] for tasks assessed/performed             Past Medical History:  Diagnosis Date   A-fib ([place])    Arthritis    BPH (benign prostatic hypertrophy)    Carotid artery disease ([place])    Doppler, December 08, 2011, 00 09% R. ICA, 98-33% LICA, followup 1 year   Coronary artery disease    DES Circumflex or CVA 2006  /  clear, February, 2011, EF 70%, no ischemia or   Ejection fraction    EF 60%, echo, 2009, mildly calcified aortic leaflets   History of colonic polyps    Hyperlipidemia    Hypertension    Hypothyroidism    Lumbar radiculopathy    Multiple thyroid nodules    Avascular echogenic areas noted in the right thyroid at the time of carotid Doppler.    PONV (postoperative nausea and vomiting)    "nausea with first hip surgery"   Skin cancer (melanoma) ([place])    PMH of    Spinal stenosis, lumbar    Tremor    Fine tremor right upper extremity   Venous insufficiency    Past Surgical History:  Procedure Laterality Date   COLONOSCOPY     CORONARY ANGIOPLASTY WITH STENT PLACEMENT  2006   x 2 stents; DES to CX and dRCA '06   KNEE SURGERY  1970's   "chipped bone"   LUMBAR LAMINECTOMY/DECOMPRESSION MICRODISCECTOMY Left 07/13/2016   Procedure: LUMBAR LAMINECTOMY AND FORAMINOTOMY Lumbar two, three, Lumbar three-four, Lumbar four-five, LEFT Lumbar five-Sacral one DISECTOMY;  Surgeon: Newman Pies, MD;  Location: [place];  Service: Neurosurgery;  Laterality: Left;   TOE SURGERY  1997    TONSILLECTOMY     TOTAL HIP ARTHROPLASTY Left 2007   TOTAL HIP ARTHROPLASTY Right 2010   TOTAL KNEE ARTHROPLASTY Right 06/25/2014   Procedure: RIGHT TOTAL KNEE ARTHROPLASTY;  Surgeon: Gearlean Alf, MD;  [facility];  Service: Orthopedics;  Laterality: Right;   VENTRICULOPERITONEAL SHUNT Right 07/09/2022   Procedure: SHUNT INSERTION VENTRICULAR-PERITONEAL;  Surgeon: Newman Pies, MD;  Location: [place];  Service: Neurosurgery;  Laterality: Right;   Patient Active Problem List   Diagnosis Date Noted   (Idiopathic) normal pressure hydrocephalus ([place]) 07/09/2022   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[place]) 02/05/2022   Gait disturbance 02/05/2022   Benign essential tremor 02/05/2022   Chronic anticoagulation 02/05/2022   Atrial fibrillation ([place]) 02/08/2019   Lumbar stenosis with neurogenic claudication 07/13/2016   Annual physical exam>>>>>>>>>>>>>>>>>> 05/04/2016   PCP NOTES>>>>>>>>>>>>>>>>>>>>>>>>>>> 12/31/2015   Edema 02/19/2015   OA (osteoarthritis) of knee 06/25/2014   Unspecified sleep apnea 06/18/2014   Multiple thyroid nodules    Tremor    Carotid artery disease ([facility])    Erectile dysfunction 05/31/2012   Coronary artery disease    Venous insufficiency    Hyperglycemia 11/09/2008   MELANOMA, HX OF 11/09/2008   COLONIC POLYPS, HYPERPLASTIC 05/10/2008   Hypothyroidism  08/10/2007   HYPERLIPIDEMIA 08/10/2007   Essential hypertension 08/10/2007   DJD (degenerative joint disease) 08/08/2007   MURMUR 08/08/2007   BENIGN PROSTATIC HYPERTROPHY, HX OF 08/05/2007    PCP: [name], MD  REFERRING PROVIDER: Newman Pies, MD  REFERRING DIAG: G91.1 (ICD-10-CM) - Obstructive hydrocephalus R26.9 (ICD-10-CM) - Gait disturbance  Rationale for Evaluation and Treatment: Rehabilitation  THERAPY DIAG:  Other abnormalities of gait and mobility  Muscle weakness (generalized)  ONSET DATE: chronic - has done PT before multiple times for strength, balance etc  SUBJECTIVE:                                                                                                                                                                                            SUBJECTIVE STATEMENT: Pt is referred to OPPT for gait training following VP shunt placement 07/09/22 for communicating hydrocephalus.  Has done PT multiple times before and wants to do it again for strength, walking and balance.  He uses a SPC in community and either furniture surfs or uses cane at home.  He feels he can do some walking without it.   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  His wife reports he can't go shopping with her due to poor endurance.   PERTINENT HISTORY:  VP shunt placed 07/09/22 for communicating hydrocephalus PMH includes lumbar surgery, bil THA, Rt TKA, a-fib, CAD, coronary angioplasty with stent, compromised ejection fraction  PAIN:  PAIN:  Are you having pain? No    PRECAUTIONS: Other: heart history, VP shunt placed 06/2022 for communicating hydrocephalus  WEIGHT BEARING RESTRICTIONS: No  FALLS:  Has patient fallen in last 6 months? No  LIVING ENVIRONMENT: Lives with: lives with their spouse Lives in: House/apartment Stairs: Yes: Internal: 14 steps; can reach both and External: 4 steps; can reach both, bedroom on main level  Has following equipment at home: Single point cane - uses it in community and furniture surfs at home or uses cane  OCCUPATION: retired  PLOF: Independent with household mobility with device and Independent with community mobility with device  PATIENT GOALS: strength, balance and gait  NEXT MD VISIT:   OBJECTIVE:   DIAGNOSTIC FINDINGS:  N/a  PATIENT SURVEYS:    SCREENING FOR RED FLAGS: Bowel or bladder incontinence: Yes: leaking after urination and if gets too full Spinal tumors: No Cauda equina syndrome: No Compression fracture: No Abdominal aneurysm: No  COGNITION: Overall cognitive status: Within functional limits for  tasks assessed     SENSATION: WFL  MUSCLE LENGTH: Hamstrings: Right 45 deg; Left 45 deg   POSTURE: flexed trunk  and weight  shift right  PALPATION: N/a  LUMBAR ROM:   AROM eval  Flexion Flexes knees with fingers to mid shin  Extension NT  Right lateral flexion 5  Left lateral flexion 8  Right rotation 25%  Left rotation 25%   (Blank rows = not tested)  LOWER EXTREMITY ROM:    Hip ROM grossly limited in ER 50% bil  LOWER EXTREMITY MMT:   Grossly 4/5 on Rt Poor glut med and quad activation in stance phase of gait  LUMBAR SPECIAL TESTS:    FUNCTIONAL TESTS:  5 times sit to stand: 17 sec mod/max use of hands on chair Timed up and go (TUG): 22 sec with SPC Berg Balance Scale: 39/52  GAIT: Distance walked: within clinic Assistive device utilized: Single point cane Level of assistance: Modified independence Comments: flexed trunk, flexed Rt knee and hip with Rt lateral lean during Rt stance phase of gait, small step length bil  TODAY'S TREATMENT:                                                                                                                              DATE: 08/27/22 Initiated HEP   PATIENT EDUCATION:  Education details: Access Code: [email or web address] Person educated: Patient and Spouse Education method: Explanation, Demonstration, and Handouts Education comprehension: verbalized understanding and returned demonstration  HOME EXERCISE PROGRAM: Access Code: [email or web address] URL: [email or web address] Date: 08/27/2022 Prepared by: Venetia Night Emryn Flanery  Exercises - Sit to Stand with Armchair  - 3 x daily - 7 x weekly - 1 sets - 5 reps - Seated Long Arc Quad  - 1 x daily - 7 x weekly - 1 sets - 10 reps - 5 hold - Standing Hip Abduction with Counter Support  - 1 x daily - 7 x weekly - 1 sets - 10 reps - Standing Hip Extension with Counter Support  - 1 x daily - 7 x weekly - 1 sets - 10 reps - Heel Raises with Counter Support  - 1 x daily - 7 x weekly -  1 sets - 10 reps  ASSESSMENT:  CLINICAL IMPRESSION: Patient is an 86 y.o. male who was seen today for physical therapy evaluation and treatment for gait training.  Pt had a VP shunt placed on 07/09/22 for communicating hydrocephalus.  He has complex medical    OBJECTIVE IMPAIRMENTS: Abnormal gait, cardiopulmonary status limiting activity, decreased activity tolerance, decreased balance, decreased coordination, decreased endurance, decreased knowledge of condition, decreased mobility, difficulty walking, decreased ROM, decreased strength, decreased safety awareness, hypomobility, increased edema, impaired flexibility, improper body mechanics, and postural dysfunction.   ACTIVITY LIMITATIONS: carrying, lifting, bending, standing, squatting, stairs, transfers, dressing, and locomotion level  PARTICIPATION LIMITATIONS: meal prep, cleaning, laundry, shopping, community activity, and yard work  PERSONAL FACTORS: Age, Time since onset of injury/illness/exacerbation, and 3+ comorbidities: + cardiac history, hydrocephalus with shunt, LE edema, Rt LE weakness with Hx of THA and TKA  are also affecting patient's functional outcome.  REHAB POTENTIAL: Good  CLINICAL DECISION MAKING: Stable/uncomplicated  EVALUATION COMPLEXITY: Low   GOALS: Goals reviewed with patient? Yes  SHORT TERM GOALS: Target date: 09/24/22  Pt will be ind with initial HEP Baseline: Goal status: INITIAL  2.  Pt will improve 5X sit to stand to </= 15 sec with min use of UE Baseline: 17 sec with mod/max use of UE Goal status: INITIAL  3.  Pt will demo improved quad and lateral hip muscle activations on Rt with stance phase of gait. Baseline:  Goal status: INITIAL  4.  Improve TUG to 19 sec or less. Baseline: 22 sec Goal status: INITIAL   LONG TERM GOALS: Target date: 10/22/22  Pt will be ind with advanced HEP to maintain gains from PT. Baseline:  Goal status: INITIAL  2.  Pt will improve TUG to 17 sec or less to  demo improved efficiency and safety with functional mobility. Baseline:  Goal status: INITIAL  3.  Improve 5X sit to stand to 13 sec or less with min or no use of UE to demo reduced fall risk Baseline: 17 sec Goal status: INITIAL  4.  Pt will demo improved gait endurance to allow for a 3 min walk test. Baseline:  Goal status: INITIAL  5.  Pt will improve LE strength to at least 4+/5 on Rt for improved gait safety, transfers and stairs Baseline: 4/5 Goal status: INITIAL  6.  Improve BERG Balance Test to at least 44/52 to demo improved static and dynamic balance to reduce fall risk. Baseline: 39/52 Goal status: INITIAL  PLAN:  PT FREQUENCY: 2x/week  PT DURATION: 8 weeks  PLANNED INTERVENTIONS: Therapeutic exercises, Therapeutic activity, Neuromuscular re-education, Balance training, Gait training, Patient/Family education, Self Care, Joint mobilization, Stair training, DME instructions, Electrical stimulation, Spinal mobilization, Cryotherapy, Moist heat, and Manual therapy.  PLAN FOR NEXT SESSION: NuStep (consider cardiac history and monitor tolerance), review HEP, progress functional strength and mobility, spine and LE flexibility, Rt LE strength, gait and balance training   Johnathon Mittal, PT 08/27/22 3:59 PM

## 2022-09-03 DIAGNOSIS — C44729 Squamous cell carcinoma of skin of left lower limb, including hip: Secondary | ICD-10-CM | POA: Diagnosis not present

## 2022-09-03 NOTE — Therapy (Signed)
OUTPATIENT PHYSICAL THERAPY TREATMENT NOTE   Patient Name: [name] MRN: [id number] DOB:May 13, 1936, 86 y.o., male Today's Date: 09/07/2022  END OF SESSION:   PT End of Session - 09/07/22 1531     Visit Number 2    Date for PT Re-Evaluation 10/22/22    PT Start Time 0255    PT Stop Time 0330    PT Time Calculation (min) 35 min    Equipment Utilized During Treatment Gait belt    Activity Tolerance Patient tolerated treatment well;Patient limited by fatigue    Behavior During Therapy [facility] for tasks assessed/performed             Past Medical History:  Diagnosis Date   A-fib ([place])    Arthritis    BPH (benign prostatic hypertrophy)    Carotid artery disease ([place])    Doppler, December 08, 2011, 00 06% R. ICA, 30-16% LICA, followup 1 year   Coronary artery disease    DES Circumflex or CVA 2006  /  clear, February, 2011, EF 70%, no ischemia or   Ejection fraction    EF 60%, echo, 2009, mildly calcified aortic leaflets   History of colonic polyps    Hyperlipidemia    Hypertension    Hypothyroidism    Lumbar radiculopathy    Multiple thyroid nodules    Avascular echogenic areas noted in the right thyroid at the time of carotid Doppler.    PONV (postoperative nausea and vomiting)    "nausea with first hip surgery"   Skin cancer (melanoma) ([place])    PMH of    Spinal stenosis, lumbar    Tremor    Fine tremor right upper extremity   Venous insufficiency    Past Surgical History:  Procedure Laterality Date   COLONOSCOPY     CORONARY ANGIOPLASTY WITH STENT PLACEMENT  2006   x 2 stents; DES to CX and dRCA '06   KNEE SURGERY  1970's   "chipped bone"   LUMBAR LAMINECTOMY/DECOMPRESSION MICRODISCECTOMY Left 07/13/2016   Procedure: LUMBAR LAMINECTOMY AND FORAMINOTOMY Lumbar two, three, Lumbar three-four, Lumbar four-five, LEFT Lumbar five-Sacral one DISECTOMY;  Surgeon: Newman Pies, MD;  Location: [place];  Service: Neurosurgery;  Laterality: Left;   TOE SURGERY  1997    TONSILLECTOMY     TOTAL HIP ARTHROPLASTY Left 2007   TOTAL HIP ARTHROPLASTY Right 2010   TOTAL KNEE ARTHROPLASTY Right 06/25/2014   Procedure: RIGHT TOTAL KNEE ARTHROPLASTY;  Surgeon: Gearlean Alf, MD;  Location[facility];  Service: Orthopedics;  Laterality: Right;   VENTRICULOPERITONEAL SHUNT Right 07/09/2022   Procedure: SHUNT INSERTION VENTRICULAR-PERITONEAL;  Surgeon: Newman Pies, MD;  Location: [place];  Service: Neurosurgery;  Laterality: Right;   Patient Active Problem List   Diagnosis Date Noted   (Idiopathic) normal pressure hydrocephalus ([place]) 07/09/2022   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[place]) 02/05/2022   Gait disturbance 02/05/2022   Benign essential tremor 02/05/2022   Chronic anticoagulation 02/05/2022   Atrial fibrillation ([place]) 02/08/2019   Lumbar stenosis with neurogenic claudication 07/13/2016   Annual physical exam>>>>>>>>>>>>>>>>>> 05/04/2016   PCP NOTES>>>>>>>>>>>>>>>>>>>>>>>>>>> 12/31/2015   Edema 02/19/2015   OA (osteoarthritis) of knee 06/25/2014   Unspecified sleep apnea 06/18/2014   Multiple thyroid nodules    Tremor    Carotid artery disease ([facility])    Erectile dysfunction 05/31/2012   Coronary artery disease    Venous insufficiency    Hyperglycemia 11/09/2008   MELANOMA, HX OF 11/09/2008   COLONIC POLYPS, HYPERPLASTIC 05/10/2008   Hypothyroidism 08/10/2007  HYPERLIPIDEMIA 08/10/2007   Essential hypertension 08/10/2007   DJD (degenerative joint disease) 08/08/2007   MURMUR 08/08/2007   BENIGN PROSTATIC HYPERTROPHY, HX OF 08/05/2007     THERAPY DIAG:  Other abnormalities of gait and mobility  Muscle weakness (generalized)   PCP: [name], MD   REFERRING PROVIDER: Newman Pies, MD   REFERRING DIAG: G91.1 (ICD-10-CM) - Obstructive hydrocephalus R26.9 (ICD-10-CM) - Gait disturbance   Rationale for Evaluation and Treatment: Rehabilitation   THERAPY DIAG:  Other abnormalities of gait and mobility   Muscle weakness (generalized)   ONSET  DATE: chronic - has done PT before multiple times for strength, balance etc   SUBJECTIVE:                                                                                                                                                                                            SUBJECTIVE STATEMENT: Pt is referred to OPPT for gait training following VP shunt placement 07/09/22 for communicating hydrocephalus. Has done PT multiple times before and wants to do it again for strength, walking and balance.  He uses a SPC in community and either furniture surfs or uses cane at home. He feels he can do some walking without it.   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  His wife reports he can't go shopping with her due to poor endurance.    PERTINENT HISTORY:  VP shunt placed 07/09/22 for communicating hydrocephalus PMH includes lumbar surgery, bil THA, Rt TKA, a-fib, CAD, coronary angioplasty with stent, compromised ejection fraction   PAIN:  PAIN:  Are you having pain? No       PRECAUTIONS: Other: heart history, VP shunt placed 06/2022 for communicating hydrocephalus   WEIGHT BEARING RESTRICTIONS: No   FALLS:  Has patient fallen in last 6 months? No   LIVING ENVIRONMENT: Lives with: lives with their spouse Lives in: House/apartment Stairs: Yes: Internal: 14 steps; can reach both and External: 4 steps; can reach both, bedroom on main level  Has following equipment at home: Single point cane - uses it in community and furniture surfs at home or uses cane   OCCUPATION: retired   PLOF: Independent with household mobility with device and Independent with community mobility with device   PATIENT GOALS: strength, balance and gait   NEXT MD VISIT:    OBJECTIVE:    DIAGNOSTIC FINDINGS:  N/a   PATIENT SURVEYS:      SCREENING FOR RED FLAGS: Bowel or bladder incontinence: Yes: leaking after urination and if gets too full Spinal tumors: No Cauda equina syndrome:  No Compression fracture: No Abdominal aneurysm:  No   COGNITION: Overall cognitive status: Within functional limits for tasks assessed                          SENSATION: WFL   MUSCLE LENGTH: Hamstrings: Right 45 deg; Left 45 deg     POSTURE: flexed trunk  and weight shift right   PALPATION: N/a   LUMBAR ROM:    AROM eval  Flexion Flexes knees with fingers to mid shin  Extension NT  Right lateral flexion 5  Left lateral flexion 8  Right rotation 25%  Left rotation 25%   (Blank rows = not tested)   LOWER EXTREMITY ROM:    Hip ROM grossly limited in ER 50% bil   LOWER EXTREMITY MMT:   Grossly 4/5 on Rt Poor glut med and quad activation in stance phase of gait   LUMBAR SPECIAL TESTS:      FUNCTIONAL TESTS:  5 times sit to stand: 17 sec mod/max use of hands on chair Timed up and go (TUG): 22 sec with SPC Berg Balance Scale: 39/52   GAIT: Distance walked: within clinic Assistive device utilized: Single point cane Level of assistance: Modified independence Comments: flexed trunk, flexed Rt knee and hip with Rt lateral lean during Rt stance phase of gait, small step length bil   TODAY'S TREATMENT:                                                                                                                              DATE: 09/07/2022:  Nustep 5 min, lvl 5 Sit to stand x10 from low mat.  Seated marching to fatigue  Gait with gait belt, with focus on big steps.  Side stepping at // bars x6 laps, with CGA Heel raises at // bars 2x10 with CGA Standing marching x15 each side with CGA Seated ball squeezes, 5 sec hold x15    08/27/22 Initiated HEP     PATIENT EDUCATION:  Education details: Access Code: [email or web address] Person educated: Patient and Spouse Education method: Explanation, Demonstration, and Handouts Education comprehension: verbalized understanding and returned demonstration   HOME EXERCISE PROGRAM: Access Code: [email or web address] URL:  [email or web address] Date: 08/27/2022 Prepared by: Venetia Night Beuhring   Exercises - Sit to Stand with Armchair  - 3 x daily - 7 x weekly - 1 sets - 5 reps - Seated Long Arc Quad  - 1 x daily - 7 x weekly - 1 sets - 10 reps - 5 hold - Standing Hip Abduction with Counter Support  - 1 x daily - 7 x weekly - 1 sets - 10 reps - Standing Hip Extension with Counter Support  - 1 x daily - 7 x weekly - 1 sets - 10 reps - Heel Raises with Counter Support  - 1 x daily - 7 x weekly - 1 sets - 10 reps   ASSESSMENT:   CLINICAL IMPRESSION: Patient reports to first follow up appt  without his AD. He walks with a shuffled gait pattern and anterior lean. Session with focus on LE strengthening, balance and gait today with CGA. Pt requires cues for slow controlled movements, and bigger steps. He also requiring intermittent seated rest breaks due to fatigue.    OBJECTIVE IMPAIRMENTS: Abnormal gait, cardiopulmonary status limiting activity, decreased activity tolerance, decreased balance, decreased coordination, decreased endurance, decreased knowledge of condition, decreased mobility, difficulty walking, decreased ROM, decreased strength, decreased safety awareness, hypomobility, increased edema, impaired flexibility, improper body mechanics, and postural dysfunction.    ACTIVITY LIMITATIONS: carrying, lifting, bending, standing, squatting, stairs, transfers, dressing, and locomotion level   PARTICIPATION LIMITATIONS: meal prep, cleaning, laundry, shopping, community activity, and yard work   PERSONAL FACTORS: Age, Time since onset of injury/illness/exacerbation, and 3+ comorbidities: + cardiac history, hydrocephalus with shunt, LE edema, Rt LE weakness with Hx of THA and TKA  are also affecting patient's functional outcome.    REHAB POTENTIAL: Good   CLINICAL DECISION MAKING: Stable/uncomplicated   EVALUATION COMPLEXITY: Low     GOALS: Goals reviewed with patient? Yes   SHORT TERM GOALS:  Target date: 09/24/22   Pt will be ind with initial HEP Baseline: Goal status: INITIAL   2.  Pt will improve 5X sit to stand to </= 15 sec with min use of UE Baseline: 17 sec with mod/max use of UE Goal status: INITIAL   3.  Pt will demo improved quad and lateral hip muscle activations on Rt with stance phase of gait. Baseline:  Goal status: INITIAL   4.  Improve TUG to 19 sec or less. Baseline: 22 sec Goal status: INITIAL     LONG TERM GOALS: Target date: 10/22/22   Pt will be ind with advanced HEP to maintain gains from PT. Baseline:  Goal status: INITIAL   2.  Pt will improve TUG to 17 sec or less to demo improved efficiency and safety with functional mobility. Baseline:  Goal status: INITIAL   3.  Improve 5X sit to stand to 13 sec or less with min or no use of UE to demo reduced fall risk Baseline: 17 sec Goal status: INITIAL   4.  Pt will demo improved gait endurance to allow for a 3 min walk test. Baseline:  Goal status: INITIAL   5.  Pt will improve LE strength to at least 4+/5 on Rt for improved gait safety, transfers and stairs Baseline: 4/5 Goal status: INITIAL   6.  Improve BERG Balance Test to at least 44/52 to demo improved static and dynamic balance to reduce fall risk. Baseline: 39/52 Goal status: INITIAL   PLAN:   PT FREQUENCY: 2x/week   PT DURATION: 8 weeks   PLANNED INTERVENTIONS: Therapeutic exercises, Therapeutic activity, Neuromuscular re-education, Balance training, Gait training, Patient/Family education, Self Care, Joint mobilization, Stair training, DME instructions, Electrical stimulation, Spinal mobilization, Cryotherapy, Moist heat, and Manual therapy.   PLAN FOR NEXT SESSION: NuStep (consider cardiac history and monitor tolerance), review HEP, progress functional strength and mobility, spine and LE flexibility, Rt LE strength, gait and balance training       Lynden Ang, PT 09/07/2022, 3:32 PM

## 2022-09-05 ENCOUNTER — Other Ambulatory Visit: Payer: Self-pay | Admitting: Cardiovascular Disease

## 2022-09-05 DIAGNOSIS — R601 Generalized edema: Secondary | ICD-10-CM

## 2022-09-07 ENCOUNTER — Ambulatory Visit: Payer: Medicare Other | Admitting: Physical Therapy

## 2022-09-07 ENCOUNTER — Encounter: Payer: Self-pay | Admitting: Physical Therapy

## 2022-09-07 DIAGNOSIS — M6281 Muscle weakness (generalized): Secondary | ICD-10-CM

## 2022-09-07 DIAGNOSIS — R2689 Other abnormalities of gait and mobility: Secondary | ICD-10-CM

## 2022-09-07 DIAGNOSIS — G910 Communicating hydrocephalus: Secondary | ICD-10-CM | POA: Diagnosis not present

## 2022-09-09 ENCOUNTER — Ambulatory Visit: Payer: Medicare Other | Admitting: Physical Therapy

## 2022-09-09 ENCOUNTER — Encounter: Payer: Self-pay | Admitting: Physical Therapy

## 2022-09-09 DIAGNOSIS — G910 Communicating hydrocephalus: Secondary | ICD-10-CM | POA: Diagnosis not present

## 2022-09-09 DIAGNOSIS — M6281 Muscle weakness (generalized): Secondary | ICD-10-CM | POA: Diagnosis not present

## 2022-09-09 DIAGNOSIS — R2689 Other abnormalities of gait and mobility: Secondary | ICD-10-CM

## 2022-09-09 NOTE — Therapy (Signed)
OUTPATIENT PHYSICAL THERAPY TREATMENT NOTE   Patient Name: [name] MRN: [id number] DOB:12-15-1935, 86 y.o., male Today's Date: 09/10/2022  END OF SESSION:   PT End of Session - 09/10/22 1212     Visit Number 3    Date for PT Re-Evaluation 10/22/22    Authorization Type [employer]    Progress Note Due on Visit 10    PT Start Time 1445    PT Stop Time 1525    PT Time Calculation (min) 40 min    Equipment Utilized During Treatment Gait belt    Activity Tolerance Patient tolerated treatment well;Patient limited by fatigue    Behavior During Therapy [facility] for tasks assessed/performed              Past Medical History:  Diagnosis Date   A-fib (HCC)    Arthritis    BPH (benign prostatic hypertrophy)    Carotid artery disease (HCC)    Doppler, December 08, 2011, 00 39% R. ICA, 40-59% LICA, followup 1 year   Coronary artery disease    DES Circumflex or CVA 2006  /  clear, February, 2011, EF 70%, no ischemia or   Ejection fraction    EF 60%, echo, 2009, mildly calcified aortic leaflets   History of colonic polyps    Hyperlipidemia    Hypertension    Hypothyroidism    Lumbar radiculopathy    Multiple thyroid nodules    Avascular echogenic areas noted in the right thyroid at the time of carotid Doppler.    PONV (postoperative nausea and vomiting)    "nausea with first hip surgery"   Skin cancer (melanoma) (HCC)    PMH of    Spinal stenosis, lumbar    Tremor    Fine tremor right upper extremity   Venous insufficiency    Past Surgical History:  Procedure Laterality Date   COLONOSCOPY     CORONARY ANGIOPLASTY WITH STENT PLACEMENT  2006   x 2 stents; DES to CX and dRCA '06   KNEE SURGERY  1970's   "chipped bone"   LUMBAR LAMINECTOMY/DECOMPRESSION MICRODISCECTOMY Left 07/13/2016   Procedure: LUMBAR LAMINECTOMY AND FORAMINOTOMY Lumbar two, three, Lumbar three-four, Lumbar four-five, LEFT Lumbar five-Sacral one DISECTOMY;  Surgeon: Tressie Stalker, MD;  Location: [facility]  OR;  Service: Neurosurgery;  Laterality: Left;   TOE SURGERY  1997   TONSILLECTOMY     TOTAL HIP ARTHROPLASTY Left 2007   TOTAL HIP ARTHROPLASTY Right 2010   TOTAL KNEE ARTHROPLASTY Right 06/25/2014   Procedure: RIGHT TOTAL KNEE ARTHROPLASTY;  Surgeon: Loanne Drilling, MD;  Location: [facility] ORS;  Service: Orthopedics;  Laterality: Right;   VENTRICULOPERITONEAL SHUNT Right 07/09/2022   Procedure: SHUNT INSERTION VENTRICULAR-PERITONEAL;  Surgeon: Tressie Stalker, MD;  Location: [facility] OR;  Service: Neurosurgery;  Laterality: Right;   Patient Active Problem List   Diagnosis Date Noted   (Idiopathic) normal pressure hydrocephalus (HCC) 07/09/2022   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HCC) 02/05/2022   Gait disturbance 02/05/2022   Benign essential tremor 02/05/2022   Chronic anticoagulation 02/05/2022   Atrial fibrillation (HCC) 02/08/2019   Lumbar stenosis with neurogenic claudication 07/13/2016   Annual physical exam>>>>>>>>>>>>>>>>>> 05/04/2016   PCP NOTES>>>>>>>>>>>>>>>>>>>>>>>>>>> 12/31/2015   Edema 02/19/2015   OA (osteoarthritis) of knee 06/25/2014   Unspecified sleep apnea 06/18/2014   Multiple thyroid nodules    Tremor    Carotid artery disease (HCC)    Erectile dysfunction 05/31/2012   Coronary artery disease    Venous insufficiency    Hyperglycemia 11/09/2008  MELANOMA, HX OF 11/09/2008   COLONIC POLYPS, HYPERPLASTIC 05/10/2008   Hypothyroidism 08/10/2007   HYPERLIPIDEMIA 08/10/2007   Essential hypertension 08/10/2007   DJD (degenerative joint disease) 08/08/2007   MURMUR 08/08/2007   BENIGN PROSTATIC HYPERTROPHY, HX OF 08/05/2007     THERAPY DIAG:  Other abnormalities of gait and mobility  Muscle weakness (generalized)   PCP: Willow Ora, MD   REFERRING PROVIDER: Tressie Stalker, MD   REFERRING DIAG: G91.1 (ICD-10-CM) - Obstructive hydrocephalus R26.9 (ICD-10-CM) - Gait disturbance   Rationale for Evaluation and Treatment: Rehabilitation   THERAPY DIAG:  Other  abnormalities of gait and mobility   Muscle weakness (generalized)   ONSET DATE: chronic - has done PT before multiple times for strength, balance etc   SUBJECTIVE:                                                                                                                                                                                            SUBJECTIVE STATEMENT: Pt states that he only knows how to shuffle his feet when he walks as he has been doing it all his life.    Eval: Pt is referred to OPPT for gait training following VP shunt placement 07/09/22 for communicating hydrocephalus. Has done PT multiple times before and wants to do it again for strength, walking and balance.  He uses a SPC in community and either furniture surfs or uses cane at home. He feels he can do some walking without it.   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  His wife reports he can't go shopping with her due to poor endurance.    PERTINENT HISTORY:  VP shunt placed 07/09/22 for communicating hydrocephalus PMH includes lumbar surgery, bil THA, Rt TKA, a-fib, CAD, coronary angioplasty with stent, compromised ejection fraction   PAIN:  PAIN:  Are you having pain? No       PRECAUTIONS: Other: heart history, VP shunt placed 06/2022 for communicating hydrocephalus   WEIGHT BEARING RESTRICTIONS: No   FALLS:  Has patient fallen in last 6 months? No   LIVING ENVIRONMENT: Lives with: lives with their spouse Lives in: House/apartment Stairs: Yes: Internal: 14 steps; can reach both and External: 4 steps; can reach both, bedroom on main level  Has following equipment at home: Single point cane - uses it in community and furniture surfs at home or uses cane   OCCUPATION: retired   PLOF: Independent with household mobility with device and Independent with community mobility with device   PATIENT GOALS: strength, balance and gait   NEXT MD VISIT:    OBJECTIVE:  DIAGNOSTIC  FINDINGS:  N/a   PATIENT SURVEYS:      SCREENING FOR RED FLAGS: Bowel or bladder incontinence: Yes: leaking after urination and if gets too full Spinal tumors: No Cauda equina syndrome: No Compression fracture: No Abdominal aneurysm: No   COGNITION: Overall cognitive status: Within functional limits for tasks assessed                          SENSATION: WFL   MUSCLE LENGTH: Hamstrings: Right 45 deg; Left 45 deg     POSTURE: flexed trunk  and weight shift right   PALPATION: N/a   LUMBAR ROM:    AROM eval  Flexion Flexes knees with fingers to mid shin  Extension NT  Right lateral flexion 5  Left lateral flexion 8  Right rotation 25%  Left rotation 25%   (Blank rows = not tested)   LOWER EXTREMITY ROM:    Hip ROM grossly limited in ER 50% bil   LOWER EXTREMITY MMT:   Grossly 4/5 on Rt Poor glut med and quad activation in stance phase of gait   LUMBAR SPECIAL TESTS:      FUNCTIONAL TESTS:  5 times sit to stand: 17 sec mod/max use of hands on chair Timed up and go (TUG): 22 sec with SPC Berg Balance Scale: 39/52   GAIT: Distance walked: within clinic Assistive device utilized: Single point cane Level of assistance: Modified independence Comments: flexed trunk, flexed Rt knee and hip with Rt lateral lean during Rt stance phase of gait, small step length bil   TODAY'S TREATMENT:  09/09/2022                                                                                                                    Nustep 11 min, lvl 5 Sit to stand x10 from low mat.  Seated marching with 2lb weights to fatigue  Gait with gait belt, with focus on big steps, 1 lap without cane, 1 lap with cane. Leg lifts over cone x15 each side.  Heel raises at // bars 2x10 with CGA Seated ball squeezes, 5 sec hold x15   DATE: 09/07/2022:  Nustep 5 min, lvl 5 Sit to stand x10 from low mat.  Seated marching to fatigue  Gait with gait belt, with focus on big steps.  Side stepping  at // bars x6 laps, with CGA Heel raises at // bars 2x10 with CGA Standing marching x15 each side with CGA Seated ball squeezes, 5 sec hold x15    08/27/22 Initiated HEP     PATIENT EDUCATION:  Education details: Access Code: [email or web address] Person educated: Patient and Spouse Education method: Explanation, Demonstration, and Handouts Education comprehension: verbalized understanding and returned demonstration   HOME EXERCISE PROGRAM: Access Code: [email or web address] URL: [email or web address] Date: 08/27/2022 Prepared by: Loistine Simas Beuhring   Exercises - Sit to Stand with Armchair  - 3 x daily - 7 x weekly - 1 sets - 5 reps - Seated Long Arc  Quad  - 1 x daily - 7 x weekly - 1 sets - 10 reps - 5 hold - Standing Hip Abduction with Counter Support  - 1 x daily - 7 x weekly - 1 sets - 10 reps - Standing Hip Extension with Counter Support  - 1 x daily - 7 x weekly - 1 sets - 10 reps - Heel Raises with Counter Support  - 1 x daily - 7 x weekly - 1 sets - 10 reps   ASSESSMENT:   CLINICAL IMPRESSION: Pt continues to walk with shuffled gait pattern, which is more evident without his SPC. He is able to ambulate with bigger steps with frequent cues and a SPC today. Remainder of session with focus on bilat LE strengthening and cardiovascular endurance. Pt's BP was checked throughout session with it remaining around 144.71 and a 77 HR. He requires intermittent seated rest breaks. Discussed self monitoring of his symptoms at home. Also discussed a referral for lymphedema due to significant swelling in bilat LE's. Pt will continue to benefit from skilled PT to address continued deficits.    OBJECTIVE IMPAIRMENTS: Abnormal gait, cardiopulmonary status limiting activity, decreased activity tolerance, decreased balance, decreased coordination, decreased endurance, decreased knowledge of condition, decreased mobility, difficulty walking, decreased ROM, decreased strength, decreased safety awareness,  hypomobility, increased edema, impaired flexibility, improper body mechanics, and postural dysfunction.    ACTIVITY LIMITATIONS: carrying, lifting, bending, standing, squatting, stairs, transfers, dressing, and locomotion level   PARTICIPATION LIMITATIONS: meal prep, cleaning, laundry, shopping, community activity, and yard work   PERSONAL FACTORS: Age, Time since onset of injury/illness/exacerbation, and 3+ comorbidities: + cardiac history, hydrocephalus with shunt, LE edema, Rt LE weakness with Hx of THA and TKA  are also affecting patient's functional outcome.    REHAB POTENTIAL: Good   CLINICAL DECISION MAKING: Stable/uncomplicated   EVALUATION COMPLEXITY: Low     GOALS: Goals reviewed with patient? Yes   SHORT TERM GOALS: Target date: 09/24/22   Pt will be ind with initial HEP Baseline: Goal status: INITIAL   2.  Pt will improve 5X sit to stand to </= 15 sec with min use of UE Baseline: 17 sec with mod/max use of UE Goal status: INITIAL   3.  Pt will demo improved quad and lateral hip muscle activations on Rt with stance phase of gait. Baseline:  Goal status: INITIAL   4.  Improve TUG to 19 sec or less. Baseline: 22 sec Goal status: INITIAL     LONG TERM GOALS: Target date: 10/22/22   Pt will be ind with advanced HEP to maintain gains from PT. Baseline:  Goal status: INITIAL   2.  Pt will improve TUG to 17 sec or less to demo improved efficiency and safety with functional mobility. Baseline:  Goal status: INITIAL   3.  Improve 5X sit to stand to 13 sec or less with min or no use of UE to demo reduced fall risk Baseline: 17 sec Goal status: INITIAL   4.  Pt will demo improved gait endurance to allow for a 3 min walk test. Baseline:  Goal status: INITIAL   5.  Pt will improve LE strength to at least 4+/5 on Rt for improved gait safety, transfers and stairs Baseline: 4/5 Goal status: INITIAL   6.  Improve BERG Balance Test to at least 44/52 to demo improved  static and dynamic balance to reduce fall risk. Baseline: 39/52 Goal status: INITIAL   PLAN:   PT FREQUENCY: 2x/week  PT DURATION: 8 weeks   PLANNED INTERVENTIONS: Therapeutic exercises, Therapeutic activity, Neuromuscular re-education, Balance training, Gait training, Patient/Family education, Self Care, Joint mobilization, Stair training, DME instructions, Electrical stimulation, Spinal mobilization, Cryotherapy, Moist heat, and Manual therapy.   PLAN FOR NEXT SESSION: NuStep (consider cardiac history and monitor tolerance), review HEP, progress functional strength and mobility, spine and LE flexibility, Rt LE strength, gait and balance training  Champ Mungo, PT 09/10/2022, 12:13 PM

## 2022-09-21 ENCOUNTER — Other Ambulatory Visit: Payer: Self-pay | Admitting: Cardiovascular Disease

## 2022-09-21 DIAGNOSIS — I482 Chronic atrial fibrillation, unspecified: Secondary | ICD-10-CM

## 2022-09-22 ENCOUNTER — Encounter: Payer: Self-pay | Admitting: Physical Therapy

## 2022-09-22 ENCOUNTER — Ambulatory Visit: Payer: Medicare Other | Attending: Neurosurgery | Admitting: Physical Therapy

## 2022-09-22 DIAGNOSIS — H25043 Posterior subcapsular polar age-related cataract, bilateral: Secondary | ICD-10-CM | POA: Diagnosis not present

## 2022-09-22 DIAGNOSIS — M6281 Muscle weakness (generalized): Secondary | ICD-10-CM

## 2022-09-22 DIAGNOSIS — H25013 Cortical age-related cataract, bilateral: Secondary | ICD-10-CM | POA: Diagnosis not present

## 2022-09-22 DIAGNOSIS — H52203 Unspecified astigmatism, bilateral: Secondary | ICD-10-CM | POA: Diagnosis not present

## 2022-09-22 DIAGNOSIS — H2513 Age-related nuclear cataract, bilateral: Secondary | ICD-10-CM | POA: Diagnosis not present

## 2022-09-22 DIAGNOSIS — H31003 Unspecified chorioretinal scars, bilateral: Secondary | ICD-10-CM | POA: Diagnosis not present

## 2022-09-22 DIAGNOSIS — H524 Presbyopia: Secondary | ICD-10-CM | POA: Diagnosis not present

## 2022-09-22 DIAGNOSIS — R2689 Other abnormalities of gait and mobility: Secondary | ICD-10-CM | POA: Diagnosis not present

## 2022-09-22 DIAGNOSIS — H43813 Vitreous degeneration, bilateral: Secondary | ICD-10-CM | POA: Diagnosis not present

## 2022-09-22 DIAGNOSIS — H401134 Primary open-angle glaucoma, bilateral, indeterminate stage: Secondary | ICD-10-CM | POA: Diagnosis not present

## 2022-09-22 NOTE — Therapy (Signed)
OUTPATIENT PHYSICAL THERAPY TREATMENT NOTE   Patient Name: KORDE JEPPSEN MRN: [id number] DOB:[birth year], 87 y.o., male Today's Date: 09/22/2022  END OF SESSION:   PT End of Session - 09/22/22 1102     Visit Number 4    Date for PT Re-Evaluation 10/22/22    Authorization Type [employer] Medicare    Progress Note Due on Visit 10    PT Start Time 1102    PT Stop Time 1145    PT Time Calculation (min) 43 min    Equipment Utilized During Treatment Gait belt    Activity Tolerance Patient tolerated treatment well;Patient limited by fatigue    Behavior During Therapy [facility] for tasks assessed/performed               Past Medical History:  Diagnosis Date   A-fib ([place])    Arthritis    BPH (benign prostatic hypertrophy)    Carotid artery disease ([place])    Doppler, December 08, 2011, 00 95% R. ICA, 28-41% LICA, followup 1 year   Coronary artery disease    DES Circumflex or CVA 2006  /  clear, February, 2011, EF 70%, no ischemia or   Ejection fraction    EF 60%, echo, 2009, mildly calcified aortic leaflets   History of colonic polyps    Hyperlipidemia    Hypertension    Hypothyroidism    Lumbar radiculopathy    Multiple thyroid nodules    Avascular echogenic areas noted in the right thyroid at the time of carotid Doppler.    PONV (postoperative nausea and vomiting)    "nausea with first hip surgery"   Skin cancer (melanoma) ([place])    PMH of    Spinal stenosis, lumbar    Tremor    Fine tremor right upper extremity   Venous insufficiency    Past Surgical History:  Procedure Laterality Date   COLONOSCOPY     CORONARY ANGIOPLASTY WITH STENT PLACEMENT  2006   x 2 stents; DES to CX and dRCA '06   KNEE SURGERY  1970's   "chipped bone"   LUMBAR LAMINECTOMY/DECOMPRESSION MICRODISCECTOMY Left 07/13/2016   Procedure: LUMBAR LAMINECTOMY AND FORAMINOTOMY Lumbar two, three, Lumbar three-four, Lumbar four-five, LEFT Lumbar five-Sacral one DISECTOMY;  Surgeon: Newman Pies, MD;  Location: [place];  Service: Neurosurgery;  Laterality: Left;   TOE SURGERY  1997   TONSILLECTOMY     TOTAL HIP ARTHROPLASTY Left 2007   TOTAL HIP ARTHROPLASTY Right 2010   TOTAL KNEE ARTHROPLASTY Right 06/25/2014   Procedure: RIGHT TOTAL KNEE ARTHROPLASTY;  Surgeon: Gearlean Alf, MD;  Location: [facility];  Service: Orthopedics;  Laterality: Right;   VENTRICULOPERITONEAL SHUNT Right 07/09/2022   Procedure: SHUNT INSERTION VENTRICULAR-PERITONEAL;  Surgeon: Newman Pies, MD;  Location: [facility];  Service: Neurosurgery;  Laterality: Right;   Patient Active Problem List   Diagnosis Date Noted   (Idiopathic) normal pressure hydrocephalus ([place]) 07/09/2022   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[place]) 02/05/2022   Gait disturbance 02/05/2022   Benign essential tremor 02/05/2022   Chronic anticoagulation 02/05/2022   Atrial fibrillation ([place]) 02/08/2019   Lumbar stenosis with neurogenic claudication 07/13/2016   Annual physical exam>>>>>>>>>>>>>>>>>> 05/04/2016   PCP NOTES>>>>>>>>>>>>>>>>>>>>>>>>>>> 12/31/2015   Edema 02/19/2015   OA (osteoarthritis) of knee 06/25/2014   Unspecified sleep apnea 06/18/2014   Multiple thyroid nodules    Tremor    Carotid artery disease ([facility])    Erectile dysfunction 05/31/2012   Coronary artery disease    Venous insufficiency    Hyperglycemia 11/09/2008  MELANOMA, HX OF 11/09/2008   COLONIC POLYPS, HYPERPLASTIC 05/10/2008   Hypothyroidism 08/10/2007   HYPERLIPIDEMIA 08/10/2007   Essential hypertension 08/10/2007   DJD (degenerative joint disease) 08/08/2007   MURMUR 08/08/2007   BENIGN PROSTATIC HYPERTROPHY, HX OF 08/05/2007     THERAPY DIAG:  Other abnormalities of gait and mobility  Muscle weakness (generalized)   PCP: [name], MD   REFERRING PROVIDER: Newman Pies, MD   REFERRING DIAG: G91.1 (ICD-10-CM) - Obstructive hydrocephalus R26.9 (ICD-10-CM) - Gait disturbance   Rationale for Evaluation and Treatment: Rehabilitation   THERAPY DIAG:  Other  abnormalities of gait and mobility   Muscle weakness (generalized)   ONSET DATE: chronic - has done PT before multiple times for strength, balance etc   SUBJECTIVE:     Therapy is going well so far.  No falls. Pt's wife reports they are going to beach this summer and the house has 14 stairs.  Pt's wife also inquires about when Pt will be strong enough  to drive again.  Hasn't driven in 2 mos.                                                                                                                                                                                       SUBJECTIVE STATEMENT:   Eval: Pt is referred to OPPT for gait training following VP shunt placement 07/09/22 for communicating hydrocephalus. Has done PT multiple times before and wants to do it again for strength, walking and balance.  He uses a SPC in community and either furniture surfs or uses cane at home. He feels he can do some walking without it.   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  His wife reports he can't go shopping with her due to poor endurance.    PERTINENT HISTORY:  VP shunt placed 07/09/22 for communicating hydrocephalus PMH includes lumbar surgery, bil THA, Rt TKA, a-fib, CAD, coronary angioplasty with stent, compromised ejection fraction   PAIN:  PAIN:  Are you having pain? No       PRECAUTIONS: Other: heart history, VP shunt placed 06/2022 for communicating hydrocephalus   WEIGHT BEARING RESTRICTIONS: No   FALLS:  Has patient fallen in last 6 months? No   LIVING ENVIRONMENT: Lives with: lives with their spouse Lives in: House/apartment Stairs: Yes: Internal: 14 steps; can reach both and External: 4 steps; can reach both, bedroom on main level  Has following equipment at home: Single point cane - uses it in community and furniture surfs at home or uses cane   OCCUPATION: retired   PLOF: Independent with household mobility with device and Independent with community  mobility with device   PATIENT GOALS: strength, balance and gait   NEXT MD VISIT:    OBJECTIVE:    DIAGNOSTIC FINDINGS:  N/a   PATIENT SURVEYS:      SCREENING FOR RED FLAGS: Bowel or bladder incontinence: Yes: leaking after urination and if gets too full Spinal tumors: No Cauda equina syndrome: No Compression fracture: No Abdominal aneurysm: No   COGNITION: Overall cognitive status: Within functional limits for tasks assessed                          SENSATION: WFL   MUSCLE LENGTH: Hamstrings: Right 45 deg; Left 45 deg     POSTURE: flexed trunk  and weight shift right   PALPATION: N/a   LUMBAR ROM:    AROM eval  Flexion Flexes knees with fingers to mid shin  Extension NT  Right lateral flexion 5  Left lateral flexion 8  Right rotation 25%  Left rotation 25%   (Blank rows = not tested)   LOWER EXTREMITY ROM:    Hip ROM grossly limited in ER 50% bil   LOWER EXTREMITY MMT:   Grossly 4/5 on Rt Poor glut med and quad activation in stance phase of gait   LUMBAR SPECIAL TESTS:      FUNCTIONAL TESTS:  5 times sit to stand: 17 sec mod/max use of hands on chair Timed up and go (TUG): 22 sec with SPC Berg Balance Scale: 39/52   GAIT: Distance walked: within clinic Assistive device utilized: Single point cane Level of assistance: Modified independence Comments: flexed trunk, flexed Rt knee and hip with Rt lateral lean during Rt stance phase of gait, small step length bil   TODAY'S TREATMENT:  09/22/22 NuStep L5 x 10' PT present to monitor and discuss progress Seated 2lb ankle weights x 10 each: scissors, hip ER, LAQ bil Seated leg press green band 1x10 each LE with 2lb ankle weight, TC for initial reps (simulating gas and break pedal for driving) Sit to stand from low mat table x 10, needed 2 trials for some reps, VC to increase hip hinge Standing march alt LE 2lb ankle weights to 6" step bil rail 2x20 Step up 6" with bil rail 1x10 each leading with  Lt/Rt Stairs x 3 rounds: 1) alt pattern with bil rail, 2) step to pattern lead with Rt up, Lt down, 3) sideways step to with bil UE on single rail on Rt  Updated HEP: sit to stand with UE reach, alt step taps and step ups  09/09/2022                                                                                                                    Nustep 11 min, lvl 5 Sit to stand x10 from low mat.  Seated marching with 2lb weights to fatigue  Gait with gait belt, with focus on big steps, 1 lap without cane, 1 lap with cane. Leg lifts over cone x15 each  side.  Heel raises at // bars 2x10 with CGA Seated ball squeezes, 5 sec hold x15   DATE: 09/07/2022:  Nustep 5 min, lvl 5 Sit to stand x10 from low mat.  Seated marching to fatigue  Gait with gait belt, with focus on big steps.  Side stepping at // bars x6 laps, with CGA Heel raises at // bars 2x10 with CGA Standing marching x15 each side with CGA Seated ball squeezes, 5 sec hold x15    08/27/22 Initiated HEP     PATIENT EDUCATION:  Education details: Access Code: [email or web address] Person educated: Patient and Spouse Education method: Explanation, Demonstration, and Handouts Education comprehension: verbalized understanding and returned demonstration   HOME EXERCISE PROGRAM: Access Code: [email or web address] URL: [email or web address] Date: 08/27/2022 Prepared by: Venetia Night Brevin Mcfadden   Exercises - Sit to Stand with Armchair  - 3 x daily - 7 x weekly - 1 sets - 5 reps - Seated Long Arc Quad  - 1 x daily - 7 x weekly - 1 sets - 10 reps - 5 hold - Standing Hip Abduction with Counter Support  - 1 x daily - 7 x weekly - 1 sets - 10 reps - Standing Hip Extension with Counter Support  - 1 x daily - 7 x weekly - 1 sets - 10 reps - Heel Raises with Counter Support  - 1 x daily - 7 x weekly - 1 sets - 10 reps   ASSESSMENT:   CLINICAL IMPRESSION: Pt's wife mentions Pt will need to be able to climb and descend 14 stairs at a beach house this  summer.  He also hasn't returned to driving (2 mos) and she inquires when he will be strong enough for this.  Progressed therex to simulate LE driving and worked on stairs today with varying patterns for step to using rail and alt pattern using bil rail.  Pt needed VC and TC to engage gluteals more with step ups and needed VC for increased hip hinge for COG over feet for sit to stand.  HEP progressed today for functional strength (sit to stand and step ups).     OBJECTIVE IMPAIRMENTS: Abnormal gait, cardiopulmonary status limiting activity, decreased activity tolerance, decreased balance, decreased coordination, decreased endurance, decreased knowledge of condition, decreased mobility, difficulty walking, decreased ROM, decreased strength, decreased safety awareness, hypomobility, increased edema, impaired flexibility, improper body mechanics, and postural dysfunction.    ACTIVITY LIMITATIONS: carrying, lifting, bending, standing, squatting, stairs, transfers, dressing, and locomotion level   PARTICIPATION LIMITATIONS: meal prep, cleaning, laundry, shopping, community activity, and yard work   PERSONAL FACTORS: Age, Time since onset of injury/illness/exacerbation, and 3+ comorbidities: + cardiac history, hydrocephalus with shunt, LE edema, Rt LE weakness with Hx of THA and TKA  are also affecting patient's functional outcome.    REHAB POTENTIAL: Good   CLINICAL DECISION MAKING: Stable/uncomplicated   EVALUATION COMPLEXITY: Low     GOALS: Goals reviewed with patient? Yes   SHORT TERM GOALS: Target date: 09/24/22   Pt will be ind with initial HEP Baseline: Goal status: INITIAL   2.  Pt will improve 5X sit to stand to </= 15 sec with min use of UE Baseline: 17 sec with mod/max use of UE Goal status: INITIAL   3.  Pt will demo improved quad and lateral hip muscle activations on Rt with stance phase of gait. Baseline:  Goal status: INITIAL   4.  Improve TUG to 19 sec or less. Baseline:  22 sec Goal status: INITIAL  LONG TERM GOALS: Target date: 10/22/22   Pt will be ind with advanced HEP to maintain gains from PT. Baseline:  Goal status: INITIAL   2.  Pt will improve TUG to 17 sec or less to demo improved efficiency and safety with functional mobility. Baseline:  Goal status: INITIAL   3.  Improve 5X sit to stand to 13 sec or less with min or no use of UE to demo reduced fall risk Baseline: 17 sec Goal status: INITIAL   4.  Pt will demo improved gait endurance to allow for a 3 min walk test. Baseline:  Goal status: INITIAL   5.  Pt will improve LE strength to at least 4+/5 on Rt for improved gait safety, transfers and stairs Baseline: 4/5 Goal status: INITIAL   6.  Improve BERG Balance Test to at least 44/52 to demo improved static and dynamic balance to reduce fall risk. Baseline: 39/52 Goal status: INITIAL   PLAN:   PT FREQUENCY: 2x/week   PT DURATION: 8 weeks   PLANNED INTERVENTIONS: Therapeutic exercises, Therapeutic activity, Neuromuscular re-education, Balance training, Gait training, Patient/Family education, Self Care, Joint mobilization, Stair training, DME instructions, Electrical stimulation, Spinal mobilization, Cryotherapy, Moist heat, and Manual therapy.   PLAN FOR NEXT SESSION: check 5x sit to stand STG, NuStep (consider cardiac history and monitor tolerance), continue to work on strength and safety for stairs, driving goal, review HEP, progress functional strength and mobility, spine and LE flexibility, Rt LE strength, gait and balance training  Rejeana Fadness, PT 09/22/22 11:57 AM

## 2022-09-23 NOTE — Therapy (Unsigned)
OUTPATIENT PHYSICAL THERAPY TREATMENT NOTE   Patient Name: [name] MRN: [id number] DOB:1936-09-14, 87 y.o., male Today's Date: 09/24/2022  END OF SESSION:   PT End of Session - 09/24/22 1529     Visit Number 5    Date for PT Re-Evaluation 10/22/22    Authorization Type [employer] Medicare    Progress Note Due on Visit 10    PT Start Time 1448    PT Stop Time 1528    PT Time Calculation (min) 40 min    Equipment Utilized During Treatment Gait belt    Activity Tolerance Patient tolerated treatment well    Behavior During Therapy [facility] for tasks assessed/performed                Past Medical History:  Diagnosis Date   A-fib ([place])    Arthritis    BPH (benign prostatic hypertrophy)    Carotid artery disease ([place])    Doppler, December 08, 2011, 00 03% R. ICA, 00-92% LICA, followup 1 year   Coronary artery disease    DES Circumflex or CVA 2006  /  clear, February, 2011, EF 70%, no ischemia or   Ejection fraction    EF 60%, echo, 2009, mildly calcified aortic leaflets   History of colonic polyps    Hyperlipidemia    Hypertension    Hypothyroidism    Lumbar radiculopathy    Multiple thyroid nodules    Avascular echogenic areas noted in the right thyroid at the time of carotid Doppler.    PONV (postoperative nausea and vomiting)    "nausea with first hip surgery"   Skin cancer (melanoma) ([place])    PMH of    Spinal stenosis, lumbar    Tremor    Fine tremor right upper extremity   Venous insufficiency    Past Surgical History:  Procedure Laterality Date   COLONOSCOPY     CORONARY ANGIOPLASTY WITH STENT PLACEMENT  2006   x 2 stents; DES to CX and dRCA '06   KNEE SURGERY  1970's   "chipped bone"   LUMBAR LAMINECTOMY/DECOMPRESSION MICRODISCECTOMY Left 07/13/2016   Procedure: LUMBAR LAMINECTOMY AND FORAMINOTOMY Lumbar two, three, Lumbar three-four, Lumbar four-five, LEFT Lumbar five-Sacral one DISECTOMY;  Surgeon: Newman Pies, MD;  Location: [place];  Service:  Neurosurgery;  Laterality: Left;   TOE SURGERY  1997   TONSILLECTOMY     TOTAL HIP ARTHROPLASTY Left 2007   TOTAL HIP ARTHROPLASTY Right 2010   TOTAL KNEE ARTHROPLASTY Right 06/25/2014   Procedure: RIGHT TOTAL KNEE ARTHROPLASTY;  Surgeon: Gearlean Alf, MD;  Location: [facility];  Service: Orthopedics;  Laterality: Right;   VENTRICULOPERITONEAL SHUNT Right 07/09/2022   Procedure: SHUNT INSERTION VENTRICULAR-PERITONEAL;  Surgeon: Newman Pies, MD;  Location: [place];  Service: Neurosurgery;  Laterality: Right;   Patient Active Problem List   Diagnosis Date Noted   (Idiopathic) normal pressure hydrocephalus ([place]) 07/09/2022   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[place]) 02/05/2022   Gait disturbance 02/05/2022   Benign essential tremor 02/05/2022   Chronic anticoagulation 02/05/2022   Atrial fibrillation ([place]) 02/08/2019   Lumbar stenosis with neurogenic claudication 07/13/2016   Annual physical exam>>>>>>>>>>>>>>>>>> 05/04/2016   PCP NOTES>>>>>>>>>>>>>>>>>>>>>>>>>>> 12/31/2015   Edema 02/19/2015   OA (osteoarthritis) of knee 06/25/2014   Unspecified sleep apnea 06/18/2014   Multiple thyroid nodules    Tremor    Carotid artery disease ([facility])    Erectile dysfunction 05/31/2012   Coronary artery disease    Venous insufficiency    Hyperglycemia 11/09/2008  MELANOMA, HX OF 11/09/2008   COLONIC POLYPS, HYPERPLASTIC 05/10/2008   Hypothyroidism 08/10/2007   HYPERLIPIDEMIA 08/10/2007   Essential hypertension 08/10/2007   DJD (degenerative joint disease) 08/08/2007   MURMUR 08/08/2007   BENIGN PROSTATIC HYPERTROPHY, HX OF 08/05/2007     THERAPY DIAG:  Other abnormalities of gait and mobility  Muscle weakness (generalized)   PCP: [name], MD   REFERRING PROVIDER: Newman Pies, MD   REFERRING DIAG: G91.1 (ICD-10-CM) - Obstructive hydrocephalus R26.9 (ICD-10-CM) - Gait disturbance   Rationale for Evaluation and Treatment: Rehabilitation   THERAPY DIAG:  Other abnormalities of gait  and mobility   Muscle weakness (generalized)   ONSET DATE: chronic - has done PT before multiple times for strength, balance etc   SUBJECTIVE:     Therapy is going well so far.  No falls. Pt's wife reports they are going to beach this summer and the house has 14 stairs.  Pt's wife also inquires about when Pt will be strong enough  to drive again.  Hasn't driven in 2 mos.                                                                                                                                                                                       SUBJECTIVE STATEMENT:   Eval: Pt is referred to OPPT for gait training following VP shunt placement 07/09/22 for communicating hydrocephalus. Has done PT multiple times before and wants to do it again for strength, walking and balance.  He uses a SPC in community and either furniture surfs or uses cane at home. He feels he can do some walking without it.   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  His wife reports he can't go shopping with her due to poor endurance.    PERTINENT HISTORY:  VP shunt placed 07/09/22 for communicating hydrocephalus PMH includes lumbar surgery, bil THA, Rt TKA, a-fib, CAD, coronary angioplasty with stent, compromised ejection fraction   PAIN:  PAIN:  Are you having pain? No       PRECAUTIONS: Other: heart history, VP shunt placed 06/2022 for communicating hydrocephalus   WEIGHT BEARING RESTRICTIONS: No   FALLS:  Has patient fallen in last 6 months? No   LIVING ENVIRONMENT: Lives with: lives with their spouse Lives in: House/apartment Stairs: Yes: Internal: 14 steps; can reach both and External: 4 steps; can reach both, bedroom on main level  Has following equipment at home: Single point cane - uses it in community and furniture surfs at home or uses cane   OCCUPATION: retired   PLOF: Independent with household mobility with device and Independent with community mobility  with device    PATIENT GOALS: strength, balance and gait   NEXT MD VISIT:    OBJECTIVE:    DIAGNOSTIC FINDINGS:  N/a   PATIENT SURVEYS:      SCREENING FOR RED FLAGS: Bowel or bladder incontinence: Yes: leaking after urination and if gets too full Spinal tumors: No Cauda equina syndrome: No Compression fracture: No Abdominal aneurysm: No   COGNITION: Overall cognitive status: Within functional limits for tasks assessed                          SENSATION: WFL   MUSCLE LENGTH: Hamstrings: Right 45 deg; Left 45 deg     POSTURE: flexed trunk  and weight shift right   PALPATION: N/a   LUMBAR ROM:    AROM eval  Flexion Flexes knees with fingers to mid shin  Extension NT  Right lateral flexion 5  Left lateral flexion 8  Right rotation 25%  Left rotation 25%   (Blank rows = not tested)   LOWER EXTREMITY ROM:    Hip ROM grossly limited in ER 50% bil   LOWER EXTREMITY MMT:   Grossly 4/5 on Rt Poor glut med and quad activation in stance phase of gait   LUMBAR SPECIAL TESTS:      FUNCTIONAL TESTS:  5 times sit to stand: 17 sec mod/max use of hands on chair Timed up and go (TUG): 22 sec with SPC Berg Balance Scale: 39/52   GAIT: Distance walked: within clinic Assistive device utilized: Single point cane Level of assistance: Modified independence Comments: flexed trunk, flexed Rt knee and hip with Rt lateral lean during Rt stance phase of gait, small step length bil   TODAY'S TREATMENT:  09/24/2022:  NuStep L5 x 10' PT present to monitor and discuss progress Seated 2.5lb ankle weights x 10 each: marching, LAQ bil Sit to stand from chair x 5, x5 with airex below feet with momentum strategy and multiple attempts to come to standing.  Stairs with VC to not pull with hands. X4 rounds (3 with bilat hands, 1 with 1 UE) Active walking with SPC with focus on taking big steps and slowing speed.   09/22/22 NuStep L5 x 10' PT present to monitor and discuss progress Seated 2lb ankle  weights x 10 each: scissors, hip ER, LAQ bil Seated leg press green band 1x10 each LE with 2lb ankle weight, TC for initial reps (simulating gas and break pedal for driving) Sit to stand from low mat table x 10, needed 2 trials for some reps, VC to increase hip hinge Standing march alt LE 2lb ankle weights to 6" step bil rail 2x20 Step up 6" with bil rail 1x10 each leading with Lt/Rt Stairs x 3 rounds: 1) alt pattern with bil rail, 2) step to pattern lead with Rt up, Lt down, 3) sideways step to with bil UE on single rail on Rt  Updated HEP: sit to stand with UE reach, alt step taps and step ups  09/09/2022  Nustep 11 min, lvl 5 Sit to stand x10 from low mat.  Seated marching with 2lb weights to fatigue  Gait with gait belt, with focus on big steps, 1 lap without cane, 1 lap with cane. Leg lifts over cone x15 each side.  Heel raises at // bars 2x10 with CGA Seated ball squeezes, 5 sec hold x15   DATE: 09/07/2022:  Nustep 5 min, lvl 5 Sit to stand x10 from low mat.  Seated marching to fatigue  Gait with gait belt, with focus on big steps.  Side stepping at // bars x6 laps, with CGA Heel raises at // bars 2x10 with CGA Standing marching x15 each side with CGA Seated ball squeezes, 5 sec hold x15    08/27/22 Initiated HEP     PATIENT EDUCATION:  Education details: Access Code: [email or web address] Person educated: Patient and Spouse Education method: Explanation, Demonstration, and Handouts Education comprehension: verbalized understanding and returned demonstration   HOME EXERCISE PROGRAM: Access Code: [email or web address] URL: [email or web address] Date: 08/27/2022 Prepared by: Venetia Night Beuhring   Exercises - Sit to Stand with Armchair  - 3 x daily - 7 x weekly - 1 sets - 5 reps - Seated Long Arc Quad  - 1 x daily - 7 x weekly - 1 sets - 10 reps - 5 hold - Standing Hip  Abduction with Counter Support  - 1 x daily - 7 x weekly - 1 sets - 10 reps - Standing Hip Extension with Counter Support  - 1 x daily - 7 x weekly - 1 sets - 10 reps - Heel Raises with Counter Support  - 1 x daily - 7 x weekly - 1 sets - 10 reps   ASSESSMENT:   CLINICAL IMPRESSION: Due to recent request to work on stair negotiation, session with continued to focus on strength and stair negotiation today. Pt also continues to walk with fast shuffled gait, continued encouragement to slow down and take bigger steps to minimize fall risk.   Challenged pt with sit to stands from airex today with use of momentum strategy. Pt has made a lot of progress since his start of PT. He continues to demonstrate improved strength and mobility. Pt will continue to benefit from skilled PT to address continued deficits.    OBJECTIVE IMPAIRMENTS: Abnormal gait, cardiopulmonary status limiting activity, decreased activity tolerance, decreased balance, decreased coordination, decreased endurance, decreased knowledge of condition, decreased mobility, difficulty walking, decreased ROM, decreased strength, decreased safety awareness, hypomobility, increased edema, impaired flexibility, improper body mechanics, and postural dysfunction.    ACTIVITY LIMITATIONS: carrying, lifting, bending, standing, squatting, stairs, transfers, dressing, and locomotion level   PARTICIPATION LIMITATIONS: meal prep, cleaning, laundry, shopping, community activity, and yard work   PERSONAL FACTORS: Age, Time since onset of injury/illness/exacerbation, and 3+ comorbidities: + cardiac history, hydrocephalus with shunt, LE edema, Rt LE weakness with Hx of THA and TKA  are also affecting patient's functional outcome.    REHAB POTENTIAL: Good   CLINICAL DECISION MAKING: Stable/uncomplicated   EVALUATION COMPLEXITY: Low     GOALS: Goals reviewed with patient? Yes   SHORT TERM GOALS: Target date: 09/24/22   Pt will be ind with initial  HEP Baseline: Goal status: INITIAL   2.  Pt will improve 5X sit to stand to </= 15 sec with min use of UE Baseline: 17 sec with mod/max use of UE Goal status: INITIAL   3.  Pt will demo improved quad and lateral hip muscle activations on Rt with stance phase of  gait. Baseline:  Goal status: INITIAL   4.  Improve TUG to 19 sec or less. Baseline: 22 sec Goal status: INITIAL     LONG TERM GOALS: Target date: 10/22/22   Pt will be ind with advanced HEP to maintain gains from PT. Baseline:  Goal status: INITIAL   2.  Pt will improve TUG to 17 sec or less to demo improved efficiency and safety with functional mobility. Baseline:  Goal status: INITIAL   3.  Improve 5X sit to stand to 13 sec or less with min or no use of UE to demo reduced fall risk Baseline: 17 sec Goal status: INITIAL   4.  Pt will demo improved gait endurance to allow for a 3 min walk test. Baseline:  Goal status: INITIAL   5.  Pt will improve LE strength to at least 4+/5 on Rt for improved gait safety, transfers and stairs Baseline: 4/5 Goal status: INITIAL   6.  Improve BERG Balance Test to at least 44/52 to demo improved static and dynamic balance to reduce fall risk. Baseline: 39/52 Goal status: INITIAL   PLAN:   PT FREQUENCY: 2x/week   PT DURATION: 8 weeks   PLANNED INTERVENTIONS: Therapeutic exercises, Therapeutic activity, Neuromuscular re-education, Balance training, Gait training, Patient/Family education, Self Care, Joint mobilization, Stair training, DME instructions, Electrical stimulation, Spinal mobilization, Cryotherapy, Moist heat, and Manual therapy.   PLAN FOR NEXT SESSION: check 5x sit to stand STG, NuStep (consider cardiac history and monitor tolerance), continue to work on strength and safety for stairs, driving goal, review HEP, progress functional strength and mobility, spine and LE flexibility, Rt LE strength, gait and balance training  Rudi Heap PT, DPT 09/24/22  3:30  PM

## 2022-09-24 ENCOUNTER — Ambulatory Visit: Payer: Medicare Other | Admitting: Physical Therapy

## 2022-09-24 DIAGNOSIS — R2689 Other abnormalities of gait and mobility: Secondary | ICD-10-CM

## 2022-09-24 DIAGNOSIS — M6281 Muscle weakness (generalized): Secondary | ICD-10-CM

## 2022-09-29 ENCOUNTER — Ambulatory Visit: Payer: Medicare Other | Admitting: Internal Medicine

## 2022-09-30 ENCOUNTER — Ambulatory Visit: Payer: Medicare Other | Admitting: Physical Therapy

## 2022-09-30 ENCOUNTER — Encounter: Payer: Self-pay | Admitting: Physical Therapy

## 2022-09-30 DIAGNOSIS — M6281 Muscle weakness (generalized): Secondary | ICD-10-CM

## 2022-09-30 DIAGNOSIS — R2689 Other abnormalities of gait and mobility: Secondary | ICD-10-CM | POA: Diagnosis not present

## 2022-09-30 NOTE — Therapy (Signed)
OUTPATIENT PHYSICAL THERAPY TREATMENT NOTE   Patient Name: [name] MRN: [id number] DOB:05-Aug-1936, 87 y.o., male Today's Date: 10/02/2022  END OF SESSION:   PT End of Session - 10/02/22 1100     Visit Number 7    Date for PT Re-Evaluation 10/22/22    Authorization Type [employer] Medicare    Progress Note Due on Visit 10    PT Start Time 1100    PT Stop Time 1145    PT Time Calculation (min) 45 min    Equipment Utilized During Treatment Gait belt    Activity Tolerance Patient tolerated treatment well    Behavior During Therapy WFL for tasks assessed/performed                  Past Medical History:  Diagnosis Date   A-fib ([place])    Arthritis    BPH (benign prostatic hypertrophy)    Carotid artery disease ([place])    Doppler, December 08, 2011, 00 14% R. ICA, 48-18% LICA, followup 1 year   Coronary artery disease    DES Circumflex or CVA 2006  /  clear, February, 2011, EF 70%, no ischemia or   Ejection fraction    EF 60%, echo, 2009, mildly calcified aortic leaflets   History of colonic polyps    Hyperlipidemia    Hypertension    Hypothyroidism    Lumbar radiculopathy    Multiple thyroid nodules    Avascular echogenic areas noted in the right thyroid at the time of carotid Doppler.    PONV (postoperative nausea and vomiting)    "nausea with first hip surgery"   Skin cancer (melanoma) ([place])    PMH of    Spinal stenosis, lumbar    Tremor    Fine tremor right upper extremity   Venous insufficiency    Past Surgical History:  Procedure Laterality Date   COLONOSCOPY     CORONARY ANGIOPLASTY WITH STENT PLACEMENT  2006   x 2 stents; DES to CX and dRCA '06   KNEE SURGERY  1970's   "chipped bone"   LUMBAR LAMINECTOMY/DECOMPRESSION MICRODISCECTOMY Left 07/13/2016   Procedure: LUMBAR LAMINECTOMY AND FORAMINOTOMY Lumbar two, three, Lumbar three-four, Lumbar four-five, LEFT Lumbar five-Sacral one DISECTOMY;  Surgeon: Newman Pies, MD;  Location: [facility];  Service:  Neurosurgery;  Laterality: Left;   TOE SURGERY  1997   TONSILLECTOMY     TOTAL HIP ARTHROPLASTY Left 2007   TOTAL HIP ARTHROPLASTY Right 2010   TOTAL KNEE ARTHROPLASTY Right 06/25/2014   Procedure: RIGHT TOTAL KNEE ARTHROPLASTY;  Surgeon: Gearlean Alf, MD;  Location: [facility];  Service: Orthopedics;  Laterality: Right;   VENTRICULOPERITONEAL SHUNT Right 07/09/2022   Procedure: SHUNT INSERTION VENTRICULAR-PERITONEAL;  Surgeon: Newman Pies, MD;  Location: [facility];  Service: Neurosurgery;  Laterality: Right;   Patient Active Problem List   Diagnosis Date Noted   (Idiopathic) normal pressure hydrocephalus ([place]) 07/09/2022   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[place]) 02/05/2022   Gait disturbance 02/05/2022   Benign essential tremor 02/05/2022   Chronic anticoagulation 02/05/2022   Atrial fibrillation ([place]) 02/08/2019   Lumbar stenosis with neurogenic claudication 07/13/2016   Annual physical exam>>>>>>>>>>>>>>>>>> 05/04/2016   PCP NOTES>>>>>>>>>>>>>>>>>>>>>>>>>>> 12/31/2015   Edema 02/19/2015   OA (osteoarthritis) of knee 06/25/2014   Unspecified sleep apnea 06/18/2014   Multiple thyroid nodules    Tremor    Carotid artery disease ([facility])    Erectile dysfunction 05/31/2012   Coronary artery disease    Venous insufficiency    Hyperglycemia 11/09/2008  MELANOMA, HX OF 11/09/2008   COLONIC POLYPS, HYPERPLASTIC 05/10/2008   Hypothyroidism 08/10/2007   HYPERLIPIDEMIA 08/10/2007   Essential hypertension 08/10/2007   DJD (degenerative joint disease) 08/08/2007   MURMUR 08/08/2007   BENIGN PROSTATIC HYPERTROPHY, HX OF 08/05/2007     THERAPY DIAG:  Other abnormalities of gait and mobility  Muscle weakness (generalized)   PCP: [name], MD   REFERRING PROVIDER: Newman Pies, MD   REFERRING DIAG: G91.1 (ICD-10-CM) - Obstructive hydrocephalus R26.9 (ICD-10-CM) - Gait disturbance   Rationale for Evaluation and Treatment: Rehabilitation   THERAPY DIAG:  Other abnormalities of gait  and mobility   Muscle weakness (generalized)   ONSET DATE: chronic - has done PT before multiple times for strength, balance etc   SUBJECTIVE:  Pt is having an easier time getting in/ out of his chairs at home.                                                                                                              SUBJECTIVE STATEMENT:   Eval: Pt is referred to OPPT for gait training following VP shunt placement 07/09/22 for communicating hydrocephalus. Has done PT multiple times before and wants to do it again for strength, walking and balance.  He uses a SPC in community and either furniture surfs or uses cane at home. He feels he can do some walking without it.   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  His wife reports he can't go shopping with her due to poor endurance.    PERTINENT HISTORY:  VP shunt placed 07/09/22 for communicating hydrocephalus PMH includes lumbar surgery, bil THA, Rt TKA, a-fib, CAD, coronary angioplasty with stent, compromised ejection fraction   PAIN:  PAIN:  Are you having pain? No       PRECAUTIONS: Other: heart history, VP shunt placed 06/2022 for communicating hydrocephalus   WEIGHT BEARING RESTRICTIONS: No   FALLS:  Has patient fallen in last 6 months? No   LIVING ENVIRONMENT: Lives with: lives with their spouse Lives in: House/apartment Stairs: Yes: Internal: 14 steps; can reach both and External: 4 steps; can reach both, bedroom on main level  Has following equipment at home: Single point cane - uses it in community and furniture surfs at home or uses cane   OCCUPATION: retired   PLOF: Independent with household mobility with device and Independent with community mobility with device   PATIENT GOALS: strength, balance and gait   NEXT MD VISIT:    OBJECTIVE:    DIAGNOSTIC FINDINGS:  N/a   PATIENT SURVEYS:      SCREENING FOR RED FLAGS: Bowel or bladder incontinence: Yes: leaking after urination and if gets  too full Spinal tumors: No Cauda equina syndrome: No Compression fracture: No Abdominal aneurysm: No   COGNITION: Overall cognitive status: Within functional limits for tasks assessed                          SENSATION: WFL   MUSCLE LENGTH:  Hamstrings: Right 45 deg; Left 45 deg     POSTURE: flexed trunk  and weight shift right   PALPATION: N/a   LUMBAR ROM:    AROM eval  Flexion Flexes knees with fingers to mid shin  Extension NT  Right lateral flexion 5  Left lateral flexion 8  Right rotation 25%  Left rotation 25%   (Blank rows = not tested)   LOWER EXTREMITY ROM:    Hip ROM grossly limited in ER 50% bil   LOWER EXTREMITY MMT:   Grossly 4/5 on Rt Poor glut med and quad activation in stance phase of gait   LUMBAR SPECIAL TESTS:      FUNCTIONAL TESTS:  5 times sit to stand: 17 sec mod/max use of hands on chair Timed up and go (TUG): 22 sec with SPC Berg Balance Scale: 39/52   GAIT: Distance walked: within clinic Assistive device utilized: Single point cane Level of assistance: Modified independence Comments: flexed trunk, flexed Rt knee and hip with Rt lateral lean during Rt stance phase of gait, small step length bil   TODAY'S TREATMENT:   10/01/2022: NuStep L5 x 10' PT present to monitor and discuss progress LAQ 2x15 2.5lb bil Sit to stand on low mat + pad with pad under feet x 10 without UE assist Walking in // bars with red theraband around knees. Side stepping 6 laps, forward and backwards walking with colored dots for cues for big steps.  Standing row blue band  2x10  Standing bil shoulder ext green band 2x10   09/30/22 NuStep L5 x 10' PT present to monitor and discuss progress Stairs x 3 rounds, alt pattern with single rail LAQ 2x15 2.5lb bil Standing on airex pad 2.5lb ankle weights march taps to 1st step x 20, 2nd step x 20, single rail Sit to stand chair + pad with pad under feet x 5 without UE assist Standing row blue band  2x10  (HEP) Standing bil shoulder ext green band 2x10 (HEP)  09/24/2022:  NuStep L5 x 10' PT present to monitor and discuss progress Seated 2.5lb ankle weights x 10 each: marching, LAQ bil Sit to stand from chair x 5, x5 with airex below feet with momentum strategy and multiple attempts to come to standing.  Stairs with VC to not pull with hands. X4 rounds (3 with bilat hands, 1 with 1 UE) Active walking with SPC with focus on taking big steps and slowing speed.   09/22/22 NuStep L5 x 10' PT present to monitor and discuss progress Seated 2lb ankle weights x 10 each: scissors, hip ER, LAQ bil Seated leg press green band 1x10 each LE with 2lb ankle weight, TC for initial reps (simulating gas and break pedal for driving) Sit to stand from low mat table x 10, needed 2 trials for some reps, VC to increase hip hinge Standing march alt LE 2lb ankle weights to 6" step bil rail 2x20 Step up 6" with bil rail 1x10 each leading with Lt/Rt Stairs x 3 rounds: 1) alt pattern with bil rail, 2) step to pattern lead with Rt up, Lt down, 3) sideways step to with bil UE on single rail on Rt  Updated HEP: sit to stand with UE reach, alt step taps and step ups     PATIENT EDUCATION:  Education details: Access Code: [email or web address] Person educated: Patient and Spouse Education method: Explanation, Demonstration, and Handouts Education comprehension: verbalized understanding and returned demonstration   HOME EXERCISE PROGRAM: Access Code: [email or web address] URL:  [email or web address] Date: 09/30/2022 Prepared by: Venetia Night Beuhring  Exercises - Seated Long Arc Quad  - 1 x daily - 7 x weekly - 1 sets - 10 reps - 5 hold - Standing Hip Abduction with Counter Support  - 1 x daily - 7 x weekly - 1 sets - 10 reps - Standing Hip Extension with Counter Support  - 1 x daily - 7 x weekly - 1 sets - 10 reps - Heel Raises with Counter Support  - 1 x daily - 7 x weekly - 1 sets - 10 reps - Seated Hip Adduction Isometrics with  Ball  - 1 x daily - 7 x weekly - 1 sets - 10 reps - Seated March  - 1 x daily - 7 x weekly - 1 sets - 10 reps - Ankle Pumps in Elevation  - 1 x daily - 7 x weekly - 1 sets - 10 reps - Forward Step Up  - 3 x daily - 7 x weekly - 2 sets - 10 reps - Standing Forward Toe Taps on Box (BKA)  - 3 x daily - 7 x weekly - 2 sets - 10 reps - Sit to Stand Without Arm Support  - 3 x daily - 7 x weekly - 1 sets - 10 reps - Standing Row with Anchored Resistance  - 1 x daily - 7 x weekly - 2 sets - 10 reps - Shoulder extension with resistance - Neutral  - 1 x daily - 7 x weekly - 2 sets - 10 reps   ASSESSMENT:   CLINICAL IMPRESSION: Pt and his wife report they are both very pleased with Pt's improvements since starting therapy. He continues to demonstrate improved balance and strength in bilat LE's each session. Today focused on gait in // bars with visual cues with great carrier over when walking with AD. Continue along POC.   OBJECTIVE IMPAIRMENTS: Abnormal gait, cardiopulmonary status limiting activity, decreased activity tolerance, decreased balance, decreased coordination, decreased endurance, decreased knowledge of condition, decreased mobility, difficulty walking, decreased ROM, decreased strength, decreased safety awareness, hypomobility, increased edema, impaired flexibility, improper body mechanics, and postural dysfunction.    ACTIVITY LIMITATIONS: carrying, lifting, bending, standing, squatting, stairs, transfers, dressing, and locomotion level   PARTICIPATION LIMITATIONS: meal prep, cleaning, laundry, shopping, community activity, and yard work   PERSONAL FACTORS: Age, Time since onset of injury/illness/exacerbation, and 3+ comorbidities: + cardiac history, hydrocephalus with shunt, LE edema, Rt LE weakness with Hx of THA and TKA  are also affecting patient's functional outcome.    REHAB POTENTIAL: Good   CLINICAL DECISION MAKING: Stable/uncomplicated   EVALUATION COMPLEXITY: Low      GOALS: Goals reviewed with patient? Yes   SHORT TERM GOALS: Target date: 09/24/22   Pt will be ind with initial HEP Baseline: Goal status: met   2.  Pt will improve 5X sit to stand to </= 15 sec with min use of UE Baseline: 17 sec with mod/max use of UE Goal status: INITIAL   3.  Pt will demo improved quad and lateral hip muscle activations on Rt with stance phase of gait. Baseline:  Goal status: met   4.  Improve TUG to 19 sec or less. Baseline: 22 sec Goal status: INITIAL     LONG TERM GOALS: Target date: 10/22/22   Pt will be ind with advanced HEP to maintain gains from PT. Baseline:  Goal status: INITIAL   2.  Pt will improve TUG to 17 sec or less to  demo improved efficiency and safety with functional mobility. Baseline:  Goal status: INITIAL   3.  Improve 5X sit to stand to 13 sec or less with min or no use of UE to demo reduced fall risk Baseline: 17 sec Goal status: INITIAL   4.  Pt will demo improved gait endurance to allow for a 3 min walk test. Baseline:  Goal status: INITIAL   5.  Pt will improve LE strength to at least 4+/5 on Rt for improved gait safety, transfers and stairs Baseline: 4/5 Goal status: INITIAL   6.  Improve BERG Balance Test to at least 44/52 to demo improved static and dynamic balance to reduce fall risk. Baseline: 39/52 Goal status: INITIAL   PLAN:   PT FREQUENCY: 2x/week   PT DURATION: 8 weeks   PLANNED INTERVENTIONS: Therapeutic exercises, Therapeutic activity, Neuromuscular re-education, Balance training, Gait training, Patient/Family education, Self Care, Joint mobilization, Stair training, DME instructions, Electrical stimulation, Spinal mobilization, Cryotherapy, Moist heat, and Manual therapy.   PLAN FOR NEXT SESSION: check 5x sit to stand and TUG, NuStep (consider cardiac history and monitor tolerance), continue to work on strength and safety for stairs, driving goal, review HEP, progress functional strength and mobility,  spine and LE flexibility, Rt LE strength, gait and balance training  Rudi Heap PT, DPT 10/02/22  11:57 AM

## 2022-09-30 NOTE — Therapy (Signed)
OUTPATIENT PHYSICAL THERAPY TREATMENT NOTE   Patient Name: [name] MRN: [id number] DOB:1935-11-11, 87 y.o., male Today's Date: 09/30/2022  END OF SESSION:   PT End of Session - 09/30/22 1235     Visit Number 6    Date for PT Re-Evaluation 10/22/22    Authorization Type [employer] Medicare    Progress Note Due on Visit 10    PT Start Time 1232    PT Stop Time 1315    PT Time Calculation (min) 43 min    Equipment Utilized During Treatment Gait belt    Activity Tolerance Patient tolerated treatment well    Behavior During Therapy WFL for tasks assessed/performed                 Past Medical History:  Diagnosis Date   A-fib ([place])    Arthritis    BPH (benign prostatic hypertrophy)    Carotid artery disease ([place])    Doppler, December 08, 2011, 00 78% R. ICA, 29-56% LICA, followup 1 year   Coronary artery disease    DES Circumflex or CVA 2006  /  clear, February, 2011, EF 70%, no ischemia or   Ejection fraction    EF 60%, echo, 2009, mildly calcified aortic leaflets   History of colonic polyps    Hyperlipidemia    Hypertension    Hypothyroidism    Lumbar radiculopathy    Multiple thyroid nodules    Avascular echogenic areas noted in the right thyroid at the time of carotid Doppler.    PONV (postoperative nausea and vomiting)    "nausea with first hip surgery"   Skin cancer (melanoma) ([place])    PMH of    Spinal stenosis, lumbar    Tremor    Fine tremor right upper extremity   Venous insufficiency    Past Surgical History:  Procedure Laterality Date   COLONOSCOPY     CORONARY ANGIOPLASTY WITH STENT PLACEMENT  2006   x 2 stents; DES to CX and dRCA '06   KNEE SURGERY  1970's   "chipped bone"   LUMBAR LAMINECTOMY/DECOMPRESSION MICRODISCECTOMY Left 07/13/2016   Procedure: LUMBAR LAMINECTOMY AND FORAMINOTOMY Lumbar two, three, Lumbar three-four, Lumbar four-five, LEFT Lumbar five-Sacral one DISECTOMY;  Surgeon: Newman Pies, MD;  Location: [facility];  Service:  Neurosurgery;  Laterality: Left;   TOE SURGERY  1997   TONSILLECTOMY     TOTAL HIP ARTHROPLASTY Left 2007   TOTAL HIP ARTHROPLASTY Right 2010   TOTAL KNEE ARTHROPLASTY Right 06/25/2014   Procedure: RIGHT TOTAL KNEE ARTHROPLASTY;  Surgeon: Gearlean Alf, MD;  Location: [facility];  Service: Orthopedics;  Laterality: Right;   VENTRICULOPERITONEAL SHUNT Right 07/09/2022   Procedure: SHUNT INSERTION VENTRICULAR-PERITONEAL;  Surgeon: Newman Pies, MD;  Location: [facility];  Service: Neurosurgery;  Laterality: Right;   Patient Active Problem List   Diagnosis Date Noted   (Idiopathic) normal pressure hydrocephalus ([place]) 07/09/2022   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[place]) 02/05/2022   Gait disturbance 02/05/2022   Benign essential tremor 02/05/2022   Chronic anticoagulation 02/05/2022   Atrial fibrillation ([place]) 02/08/2019   Lumbar stenosis with neurogenic claudication 07/13/2016   Annual physical exam>>>>>>>>>>>>>>>>>> 05/04/2016   PCP NOTES>>>>>>>>>>>>>>>>>>>>>>>>>>> 12/31/2015   Edema 02/19/2015   OA (osteoarthritis) of knee 06/25/2014   Unspecified sleep apnea 06/18/2014   Multiple thyroid nodules    Tremor    Carotid artery disease ([facility])    Erectile dysfunction 05/31/2012   Coronary artery disease    Venous insufficiency    Hyperglycemia 11/09/2008  MELANOMA, HX OF 11/09/2008   COLONIC POLYPS, HYPERPLASTIC 05/10/2008   Hypothyroidism 08/10/2007   HYPERLIPIDEMIA 08/10/2007   Essential hypertension 08/10/2007   DJD (degenerative joint disease) 08/08/2007   MURMUR 08/08/2007   BENIGN PROSTATIC HYPERTROPHY, HX OF 08/05/2007     THERAPY DIAG:  Other abnormalities of gait and mobility  Muscle weakness (generalized)   PCP: [name], MD   REFERRING PROVIDER: Newman Pies, MD   REFERRING DIAG: G91.1 (ICD-10-CM) - Obstructive hydrocephalus R26.9 (ICD-10-CM) - Gait disturbance   Rationale for Evaluation and Treatment: Rehabilitation   THERAPY DIAG:  Other abnormalities of gait  and mobility   Muscle weakness (generalized)   ONSET DATE: chronic - has done PT before multiple times for strength, balance etc   SUBJECTIVE:     Pt and his wife both feel Pt is making great improvements in strength, balance, gait and confidence.                                                                                                                                                                                       SUBJECTIVE STATEMENT:   Eval: Pt is referred to OPPT for gait training following VP shunt placement 07/09/22 for communicating hydrocephalus. Has done PT multiple times before and wants to do it again for strength, walking and balance.  He uses a SPC in community and either furniture surfs or uses cane at home. He feels he can do some walking without it.   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  His wife reports he can't go shopping with her due to poor endurance.    PERTINENT HISTORY:  VP shunt placed 07/09/22 for communicating hydrocephalus PMH includes lumbar surgery, bil THA, Rt TKA, a-fib, CAD, coronary angioplasty with stent, compromised ejection fraction   PAIN:  PAIN:  Are you having pain? No       PRECAUTIONS: Other: heart history, VP shunt placed 06/2022 for communicating hydrocephalus   WEIGHT BEARING RESTRICTIONS: No   FALLS:  Has patient fallen in last 6 months? No   LIVING ENVIRONMENT: Lives with: lives with their spouse Lives in: House/apartment Stairs: Yes: Internal: 14 steps; can reach both and External: 4 steps; can reach both, bedroom on main level  Has following equipment at home: Single point cane - uses it in community and furniture surfs at home or uses cane   OCCUPATION: retired   PLOF: Independent with household mobility with device and Independent with community mobility with device   PATIENT GOALS: strength, balance and gait   NEXT MD VISIT:    OBJECTIVE:    DIAGNOSTIC FINDINGS:  N/a   PATIENT  SURVEYS:      SCREENING FOR RED FLAGS: Bowel or bladder incontinence: Yes: leaking after urination and if gets too full Spinal tumors: No Cauda equina syndrome: No Compression fracture: No Abdominal aneurysm: No   COGNITION: Overall cognitive status: Within functional limits for tasks assessed                          SENSATION: WFL   MUSCLE LENGTH: Hamstrings: Right 45 deg; Left 45 deg     POSTURE: flexed trunk  and weight shift right   PALPATION: N/a   LUMBAR ROM:    AROM eval  Flexion Flexes knees with fingers to mid shin  Extension NT  Right lateral flexion 5  Left lateral flexion 8  Right rotation 25%  Left rotation 25%   (Blank rows = not tested)   LOWER EXTREMITY ROM:    Hip ROM grossly limited in ER 50% bil   LOWER EXTREMITY MMT:   Grossly 4/5 on Rt Poor glut med and quad activation in stance phase of gait   LUMBAR SPECIAL TESTS:      FUNCTIONAL TESTS:  5 times sit to stand: 17 sec mod/max use of hands on chair Timed up and go (TUG): 22 sec with SPC Berg Balance Scale: 39/52   GAIT: Distance walked: within clinic Assistive device utilized: Single point cane Level of assistance: Modified independence Comments: flexed trunk, flexed Rt knee and hip with Rt lateral lean during Rt stance phase of gait, small step length bil   TODAY'S TREATMENT:   09/30/22 NuStep L5 x 10' PT present to monitor and discuss progress Stairs x 3 rounds, alt pattern with single rail LAQ 2x15 2.5lb bil Standing on airex pad 2.5lb ankle weights march taps to 1st step x 20, 2nd step x 20, single rail Sit to stand chair + pad with pad under feet x 5 without UE assist Standing row blue band  2x10 (HEP) Standing bil shoulder ext green band 2x10 (HEP)  09/24/2022:  NuStep L5 x 10' PT present to monitor and discuss progress Seated 2.5lb ankle weights x 10 each: marching, LAQ bil Sit to stand from chair x 5, x5 with airex below feet with momentum strategy and multiple  attempts to come to standing.  Stairs with VC to not pull with hands. X4 rounds (3 with bilat hands, 1 with 1 UE) Active walking with SPC with focus on taking big steps and slowing speed.   09/22/22 NuStep L5 x 10' PT present to monitor and discuss progress Seated 2lb ankle weights x 10 each: scissors, hip ER, LAQ bil Seated leg press green band 1x10 each LE with 2lb ankle weight, TC for initial reps (simulating gas and break pedal for driving) Sit to stand from low mat table x 10, needed 2 trials for some reps, VC to increase hip hinge Standing march alt LE 2lb ankle weights to 6" step bil rail 2x20 Step up 6" with bil rail 1x10 each leading with Lt/Rt Stairs x 3 rounds: 1) alt pattern with bil rail, 2) step to pattern lead with Rt up, Lt down, 3) sideways step to with bil UE on single rail on Rt  Updated HEP: sit to stand with UE reach, alt step taps and step ups     PATIENT EDUCATION:  Education details: Access Code: [email or web address] Person educated: Patient and Spouse Education method: Explanation, Demonstration, and Handouts Education comprehension: verbalized understanding and returned demonstration   HOME EXERCISE PROGRAM:  Access Code: [email or web address] URL: [email or web address] Date: 09/30/2022 Prepared by: Venetia Night Satvik Parco  Exercises - Seated Long Arc Quad  - 1 x daily - 7 x weekly - 1 sets - 10 reps - 5 hold - Standing Hip Abduction with Counter Support  - 1 x daily - 7 x weekly - 1 sets - 10 reps - Standing Hip Extension with Counter Support  - 1 x daily - 7 x weekly - 1 sets - 10 reps - Heel Raises with Counter Support  - 1 x daily - 7 x weekly - 1 sets - 10 reps - Seated Hip Adduction Isometrics with Ball  - 1 x daily - 7 x weekly - 1 sets - 10 reps - Seated March  - 1 x daily - 7 x weekly - 1 sets - 10 reps - Ankle Pumps in Elevation  - 1 x daily - 7 x weekly - 1 sets - 10 reps - Forward Step Up  - 3 x daily - 7 x weekly - 2 sets - 10 reps - Standing Forward Toe Taps on  Box (BKA)  - 3 x daily - 7 x weekly - 2 sets - 10 reps - Sit to Stand Without Arm Support  - 3 x daily - 7 x weekly - 1 sets - 10 reps - Standing Row with Anchored Resistance  - 1 x daily - 7 x weekly - 2 sets - 10 reps - Shoulder extension with resistance - Neutral  - 1 x daily - 7 x weekly - 2 sets - 10 reps   ASSESSMENT:   CLINICAL IMPRESSION: Pt and his wife report they are both very pleased with Pt's improvements since starting therapy.  He is compliant with HEP.  He demonstrates improving stair negotiation using single rail and alternating step pattern with less UE pulling on way up and good control on way down.  PT added standing row and shoulder extension to work on more upright posture and standing trunk control with UE overlay.  Continue along POC.   OBJECTIVE IMPAIRMENTS: Abnormal gait, cardiopulmonary status limiting activity, decreased activity tolerance, decreased balance, decreased coordination, decreased endurance, decreased knowledge of condition, decreased mobility, difficulty walking, decreased ROM, decreased strength, decreased safety awareness, hypomobility, increased edema, impaired flexibility, improper body mechanics, and postural dysfunction.    ACTIVITY LIMITATIONS: carrying, lifting, bending, standing, squatting, stairs, transfers, dressing, and locomotion level   PARTICIPATION LIMITATIONS: meal prep, cleaning, laundry, shopping, community activity, and yard work   PERSONAL FACTORS: Age, Time since onset of injury/illness/exacerbation, and 3+ comorbidities: + cardiac history, hydrocephalus with shunt, LE edema, Rt LE weakness with Hx of THA and TKA  are also affecting patient's functional outcome.    REHAB POTENTIAL: Good   CLINICAL DECISION MAKING: Stable/uncomplicated   EVALUATION COMPLEXITY: Low     GOALS: Goals reviewed with patient? Yes   SHORT TERM GOALS: Target date: 09/24/22   Pt will be ind with initial HEP Baseline: Goal status: met   2.  Pt will  improve 5X sit to stand to </= 15 sec with min use of UE Baseline: 17 sec with mod/max use of UE Goal status: INITIAL   3.  Pt will demo improved quad and lateral hip muscle activations on Rt with stance phase of gait. Baseline:  Goal status: met   4.  Improve TUG to 19 sec or less. Baseline: 22 sec Goal status: INITIAL     LONG TERM GOALS: Target date: 10/22/22   Pt will be ind  with advanced HEP to maintain gains from PT. Baseline:  Goal status: INITIAL   2.  Pt will improve TUG to 17 sec or less to demo improved efficiency and safety with functional mobility. Baseline:  Goal status: INITIAL   3.  Improve 5X sit to stand to 13 sec or less with min or no use of UE to demo reduced fall risk Baseline: 17 sec Goal status: INITIAL   4.  Pt will demo improved gait endurance to allow for a 3 min walk test. Baseline:  Goal status: INITIAL   5.  Pt will improve LE strength to at least 4+/5 on Rt for improved gait safety, transfers and stairs Baseline: 4/5 Goal status: INITIAL   6.  Improve BERG Balance Test to at least 44/52 to demo improved static and dynamic balance to reduce fall risk. Baseline: 39/52 Goal status: INITIAL   PLAN:   PT FREQUENCY: 2x/week   PT DURATION: 8 weeks   PLANNED INTERVENTIONS: Therapeutic exercises, Therapeutic activity, Neuromuscular re-education, Balance training, Gait training, Patient/Family education, Self Care, Joint mobilization, Stair training, DME instructions, Electrical stimulation, Spinal mobilization, Cryotherapy, Moist heat, and Manual therapy.   PLAN FOR NEXT SESSION: check 5x sit to stand and TUG, NuStep (consider cardiac history and monitor tolerance), continue to work on strength and safety for stairs, driving goal, review HEP, progress functional strength and mobility, spine and LE flexibility, Rt LE strength, gait and balance training  Ercole Georg, PT 09/30/22 1:36 PM

## 2022-10-02 ENCOUNTER — Ambulatory Visit: Payer: Medicare Other | Admitting: Physical Therapy

## 2022-10-02 DIAGNOSIS — R2689 Other abnormalities of gait and mobility: Secondary | ICD-10-CM | POA: Diagnosis not present

## 2022-10-02 DIAGNOSIS — M6281 Muscle weakness (generalized): Secondary | ICD-10-CM | POA: Diagnosis not present

## 2022-10-06 ENCOUNTER — Ambulatory Visit: Payer: Medicare Other | Admitting: Physical Therapy

## 2022-10-06 ENCOUNTER — Encounter: Payer: Self-pay | Admitting: Physical Therapy

## 2022-10-06 DIAGNOSIS — M6281 Muscle weakness (generalized): Secondary | ICD-10-CM

## 2022-10-06 DIAGNOSIS — R2689 Other abnormalities of gait and mobility: Secondary | ICD-10-CM | POA: Diagnosis not present

## 2022-10-06 NOTE — Therapy (Signed)
OUTPATIENT PHYSICAL THERAPY TREATMENT NOTE   Patient Name: [name] MRN: [id number] DOB:November 20, 1935, 87 y.o., male Today's Date: 10/06/2022  END OF SESSION:   PT End of Session - 10/06/22 1236     Visit Number 8    Date for PT Re-Evaluation 10/22/22    Authorization Type [employer] Medicare    Progress Note Due on Visit 10    PT Start Time 35    PT Stop Time 1315    PT Time Calculation (min) 45 min    Equipment Utilized During Treatment Gait belt    Activity Tolerance Patient tolerated treatment well    Behavior During Therapy WFL for tasks assessed/performed                   Past Medical History:  Diagnosis Date   A-fib ([place])    Arthritis    BPH (benign prostatic hypertrophy)    Carotid artery disease ([place])    Doppler, December 08, 2011, 00 69% R. ICA, 48-54% LICA, followup 1 year   Coronary artery disease    DES Circumflex or CVA 2006  /  clear, February, 2011, EF 70%, no ischemia or   Ejection fraction    EF 60%, echo, 2009, mildly calcified aortic leaflets   History of colonic polyps    Hyperlipidemia    Hypertension    Hypothyroidism    Lumbar radiculopathy    Multiple thyroid nodules    Avascular echogenic areas noted in the right thyroid at the time of carotid Doppler.    PONV (postoperative nausea and vomiting)    "nausea with first hip surgery"   Skin cancer (melanoma) ([place])    PMH of    Spinal stenosis, lumbar    Tremor    Fine tremor right upper extremity   Venous insufficiency    Past Surgical History:  Procedure Laterality Date   COLONOSCOPY     CORONARY ANGIOPLASTY WITH STENT PLACEMENT  2006   x 2 stents; DES to CX and dRCA '06   KNEE SURGERY  1970's   "chipped bone"   LUMBAR LAMINECTOMY/DECOMPRESSION MICRODISCECTOMY Left 07/13/2016   Procedure: LUMBAR LAMINECTOMY AND FORAMINOTOMY Lumbar two, three, Lumbar three-four, Lumbar four-five, LEFT Lumbar five-Sacral one DISECTOMY;  Surgeon: Newman Pies, MD;  Location: [facility];  Service:  Neurosurgery;  Laterality: Left;   TOE SURGERY  1997   TONSILLECTOMY     TOTAL HIP ARTHROPLASTY Left 2007   TOTAL HIP ARTHROPLASTY Right 2010   TOTAL KNEE ARTHROPLASTY Right 06/25/2014   Procedure: RIGHT TOTAL KNEE ARTHROPLASTY;  Surgeon: Gearlean Alf, MD;  Location: [facility];  Service: Orthopedics;  Laterality: Right;   VENTRICULOPERITONEAL SHUNT Right 07/09/2022   Procedure: SHUNT INSERTION VENTRICULAR-PERITONEAL;  Surgeon: Newman Pies, MD;  Location: [facility];  Service: Neurosurgery;  Laterality: Right;   Patient Active Problem List   Diagnosis Date Noted   (Idiopathic) normal pressure hydrocephalus ([place]) 07/09/2022   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[place]) 02/05/2022   Gait disturbance 02/05/2022   Benign essential tremor 02/05/2022   Chronic anticoagulation 02/05/2022   Atrial fibrillation ([place]) 02/08/2019   Lumbar stenosis with neurogenic claudication 07/13/2016   Annual physical exam>>>>>>>>>>>>>>>>>> 05/04/2016   PCP NOTES>>>>>>>>>>>>>>>>>>>>>>>>>>> 12/31/2015   Edema 02/19/2015   OA (osteoarthritis) of knee 06/25/2014   Unspecified sleep apnea 06/18/2014   Multiple thyroid nodules    Tremor    Carotid artery disease ([facility])    Erectile dysfunction 05/31/2012   Coronary artery disease    Venous insufficiency    Hyperglycemia  11/09/2008   MELANOMA, HX OF 11/09/2008   COLONIC POLYPS, HYPERPLASTIC 05/10/2008   Hypothyroidism 08/10/2007   HYPERLIPIDEMIA 08/10/2007   Essential hypertension 08/10/2007   DJD (degenerative joint disease) 08/08/2007   MURMUR 08/08/2007   BENIGN PROSTATIC HYPERTROPHY, HX OF 08/05/2007     THERAPY DIAG:  Other abnormalities of gait and mobility  Muscle weakness (generalized)   PCP: [name], MD   REFERRING PROVIDER: Newman Pies, MD   REFERRING DIAG: G91.1 (ICD-10-CM) - Obstructive hydrocephalus R26.9 (ICD-10-CM) - Gait disturbance   Rationale for Evaluation and Treatment: Rehabilitation   THERAPY DIAG:  Other abnormalities of gait  and mobility   Muscle weakness (generalized)   ONSET DATE: chronic - has done PT before multiple times for strength, balance etc   SUBJECTIVE:  I forgot my cane today.                                                                                                              SUBJECTIVE STATEMENT:   Eval: Pt is referred to OPPT for gait training following VP shunt placement 07/09/22 for communicating hydrocephalus. Has done PT multiple times before and wants to do it again for strength, walking and balance.  He uses a SPC in community and either furniture surfs or uses cane at home. He feels he can do some walking without it.   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  His wife reports he can't go shopping with her due to poor endurance.    PERTINENT HISTORY:  VP shunt placed 07/09/22 for communicating hydrocephalus PMH includes lumbar surgery, bil THA, Rt TKA, a-fib, CAD, coronary angioplasty with stent, compromised ejection fraction   PAIN:  PAIN:  Are you having pain? No       PRECAUTIONS: Other: heart history, VP shunt placed 06/2022 for communicating hydrocephalus   WEIGHT BEARING RESTRICTIONS: No   FALLS:  Has patient fallen in last 6 months? No   LIVING ENVIRONMENT: Lives with: lives with their spouse Lives in: House/apartment Stairs: Yes: Internal: 14 steps; can reach both and External: 4 steps; can reach both, bedroom on main level  Has following equipment at home: Single point cane - uses it in community and furniture surfs at home or uses cane   OCCUPATION: retired   PLOF: Independent with household mobility with device and Independent with community mobility with device   PATIENT GOALS: strength, balance and gait   NEXT MD VISIT:    OBJECTIVE:    DIAGNOSTIC FINDINGS:  N/a   PATIENT SURVEYS:      SCREENING FOR RED FLAGS: Bowel or bladder incontinence: Yes: leaking after urination and if gets too full Spinal tumors: No Cauda equina  syndrome: No Compression fracture: No Abdominal aneurysm: No   COGNITION: Overall cognitive status: Within functional limits for tasks assessed                          SENSATION: WFL   MUSCLE LENGTH: Hamstrings: Right 45 deg; Left 45  deg     POSTURE: flexed trunk  and weight shift right   PALPATION: N/a   LUMBAR ROM:    AROM eval  Flexion Flexes knees with fingers to mid shin  Extension NT  Right lateral flexion 5  Left lateral flexion 8  Right rotation 25%  Left rotation 25%   (Blank rows = not tested)   LOWER EXTREMITY ROM:    Hip ROM grossly limited in ER 50% bil   LOWER EXTREMITY MMT:   Grossly 4/5 on Rt Poor glut med and quad activation in stance phase of gait   LUMBAR SPECIAL TESTS:      FUNCTIONAL TESTS:  5 times sit to stand: 17 sec mod/max use of hands on chair Timed up and go (TUG): 22 sec with SPC Berg Balance Scale: 39/52   GAIT: Distance walked: within clinic Assistive device utilized: Single point cane Level of assistance: Modified independence Comments: flexed trunk, flexed Rt knee and hip with Rt lateral lean during Rt stance phase of gait, small step length bil   TODAY'S TREATMENT:  10/06/22: NuStep L5 x 10' PT present to monitor and discuss progress LAQ 3lb 2x15 bil Sit to stand from chair 1x8, then 1x5 with time taken to stand tall (squeeze gluteals, shoulders back), no UE assist Seated 5lb kbell chest press and overhead press x 5 rounds Standing blue tband row 2x15 Stagger stance step and hold x 5" x 10, then stagger stance facing wall UE reach taps to wall overhead, cross body and chest height Stagger stance with head turns x 5 each foot position Gait without AD with CGA around gym and down long hallway Update HEP: counter stagger stance with head turns and UE reaches  10/01/2022: NuStep L5 x 10' PT present to monitor and discuss progress LAQ 2x15 2.5lb bil Sit to stand on low mat + pad with pad under feet x 10 without UE  assist Walking in // bars with red theraband around knees. Side stepping 6 laps, forward and backwards walking with colored dots for cues for big steps.  Standing row blue band  2x10  Standing bil shoulder ext green band 2x10   09/30/22 NuStep L5 x 10' PT present to monitor and discuss progress Stairs x 3 rounds, alt pattern with single rail LAQ 2x15 2.5lb bil Standing on airex pad 2.5lb ankle weights march taps to 1st step x 20, 2nd step x 20, single rail Sit to stand chair + pad with pad under feet x 5 without UE assist Standing row blue band  2x10 (HEP) Standing bil shoulder ext green band 2x10 (HEP)      PATIENT EDUCATION:  Education details: Access Code: [email or web address] Person educated: Patient and Spouse Education method: Explanation, Demonstration, and Handouts Education comprehension: verbalized understanding and returned demonstration   HOME EXERCISE PROGRAM: Access Code: [email or web address] URL: [email or web address] Date: 09/30/2022 Prepared by: Venetia Night Eligah Anello  Exercises - Seated Long Arc Quad  - 1 x daily - 7 x weekly - 1 sets - 10 reps - 5 hold - Standing Hip Abduction with Counter Support  - 1 x daily - 7 x weekly - 1 sets - 10 reps - Standing Hip Extension with Counter Support  - 1 x daily - 7 x weekly - 1 sets - 10 reps - Heel Raises with Counter Support  - 1 x daily - 7 x weekly - 1 sets - 10 reps - Seated Hip Adduction Isometrics with Ball  - 1 x daily - 7 x weekly -  1 sets - 10 reps - Seated March  - 1 x daily - 7 x weekly - 1 sets - 10 reps - Ankle Pumps in Elevation  - 1 x daily - 7 x weekly - 1 sets - 10 reps - Forward Step Up  - 3 x daily - 7 x weekly - 2 sets - 10 reps - Standing Forward Toe Taps on Box (BKA)  - 3 x daily - 7 x weekly - 2 sets - 10 reps - Sit to Stand Without Arm Support  - 3 x daily - 7 x weekly - 1 sets - 10 reps - Standing Row with Anchored Resistance  - 1 x daily - 7 x weekly - 2 sets - 10 reps - Shoulder extension with resistance -  Neutral  - 1 x daily - 7 x weekly - 2 sets - 10 reps   ASSESSMENT:   CLINICAL IMPRESSION: Pt and his wife report they are both very pleased with Pt's improvements since starting therapy. He continues to demonstrate improved balance and strength in bilat LE's each session. PT added stagger stance with head turns and UE reaching with good tolerance so added to HEP at counter.  Pt forgot his cane today and did perform some gait with close supervision and CGA for safety with good demo of increased step length bil today.     OBJECTIVE IMPAIRMENTS: Abnormal gait, cardiopulmonary status limiting activity, decreased activity tolerance, decreased balance, decreased coordination, decreased endurance, decreased knowledge of condition, decreased mobility, difficulty walking, decreased ROM, decreased strength, decreased safety awareness, hypomobility, increased edema, impaired flexibility, improper body mechanics, and postural dysfunction.    ACTIVITY LIMITATIONS: carrying, lifting, bending, standing, squatting, stairs, transfers, dressing, and locomotion level   PARTICIPATION LIMITATIONS: meal prep, cleaning, laundry, shopping, community activity, and yard work   PERSONAL FACTORS: Age, Time since onset of injury/illness/exacerbation, and 3+ comorbidities: + cardiac history, hydrocephalus with shunt, LE edema, Rt LE weakness with Hx of THA and TKA  are also affecting patient's functional outcome.    REHAB POTENTIAL: Good   CLINICAL DECISION MAKING: Stable/uncomplicated   EVALUATION COMPLEXITY: Low     GOALS: Goals reviewed with patient? Yes   SHORT TERM GOALS: Target date: 09/24/22   Pt will be ind with initial HEP Baseline: Goal status: met   2.  Pt will improve 5X sit to stand to </= 15 sec with min use of UE Baseline: 17 sec with mod/max use of UE Goal status: INITIAL   3.  Pt will demo improved quad and lateral hip muscle activations on Rt with stance phase of gait. Baseline:  Goal status:  met   4.  Improve TUG to 19 sec or less. Baseline: 22 sec Goal status: INITIAL     LONG TERM GOALS: Target date: 10/22/22   Pt will be ind with advanced HEP to maintain gains from PT. Baseline:  Goal status: INITIAL   2.  Pt will improve TUG to 17 sec or less to demo improved efficiency and safety with functional mobility. Baseline:  Goal status: INITIAL   3.  Improve 5X sit to stand to 13 sec or less with min or no use of UE to demo reduced fall risk Baseline: 17 sec Goal status: INITIAL   4.  Pt will demo improved gait endurance to allow for a 3 min walk test. Baseline:  Goal status: INITIAL   5.  Pt will improve LE strength to at least 4+/5 on Rt for improved gait safety,  transfers and stairs Baseline: 4/5 Goal status: INITIAL   6.  Improve BERG Balance Test to at least 44/52 to demo improved static and dynamic balance to reduce fall risk. Baseline: 39/52 Goal status: INITIAL   PLAN:   PT FREQUENCY: 2x/week   PT DURATION: 8 weeks   PLANNED INTERVENTIONS: Therapeutic exercises, Therapeutic activity, Neuromuscular re-education, Balance training, Gait training, Patient/Family education, Self Care, Joint mobilization, Stair training, DME instructions, Electrical stimulation, Spinal mobilization, Cryotherapy, Moist heat, and Manual therapy.   PLAN FOR NEXT SESSION: check 5x sit to stand and TUG, NuStep (consider cardiac history and monitor tolerance), continue to work on strength and safety for stairs, driving goal, review HEP, progress functional strength and mobility, spine and LE flexibility, Rt LE strength, gait and balance training  Jnyah Brazee, PT 10/06/22 1:27 PM

## 2022-10-08 ENCOUNTER — Ambulatory Visit: Payer: Medicare Other | Admitting: Physical Therapy

## 2022-10-08 ENCOUNTER — Encounter: Payer: Self-pay | Admitting: Physical Therapy

## 2022-10-08 DIAGNOSIS — M6281 Muscle weakness (generalized): Secondary | ICD-10-CM | POA: Diagnosis not present

## 2022-10-08 DIAGNOSIS — R2689 Other abnormalities of gait and mobility: Secondary | ICD-10-CM | POA: Diagnosis not present

## 2022-10-08 NOTE — Therapy (Signed)
OUTPATIENT PHYSICAL THERAPY TREATMENT NOTE   Patient Name: [name] MRN: [id number] DOB:1936/02/06, 87 y.o., male Today's Date: 10/08/2022  END OF SESSION:   PT End of Session - 10/08/22 1105     Visit Number 9    Date for PT Re-Evaluation 10/22/22    Authorization Type [employer] Medicare    Progress Note Due on Visit 10    PT Start Time 1100    PT Stop Time 1145    PT Time Calculation (min) 45 min    Activity Tolerance Patient tolerated treatment well    Behavior During Therapy [facility] for tasks assessed/performed                    Past Medical History:  Diagnosis Date   A-fib ([place])    Arthritis    BPH (benign prostatic hypertrophy)    Carotid artery disease ([place])    Doppler, December 08, 2011, 00 62% R. ICA, 69-48% LICA, followup 1 year   Coronary artery disease    DES Circumflex or CVA 2006  /  clear, February, 2011, EF 70%, no ischemia or   Ejection fraction    EF 60%, echo, 2009, mildly calcified aortic leaflets   History of colonic polyps    Hyperlipidemia    Hypertension    Hypothyroidism    Lumbar radiculopathy    Multiple thyroid nodules    Avascular echogenic areas noted in the right thyroid at the time of carotid Doppler.    PONV (postoperative nausea and vomiting)    "nausea with first hip surgery"   Skin cancer (melanoma) ([place])    PMH of    Spinal stenosis, lumbar    Tremor    Fine tremor right upper extremity   Venous insufficiency    Past Surgical History:  Procedure Laterality Date   COLONOSCOPY     CORONARY ANGIOPLASTY WITH STENT PLACEMENT  2006   x 2 stents; DES to CX and dRCA '06   KNEE SURGERY  1970's   "chipped bone"   LUMBAR LAMINECTOMY/DECOMPRESSION MICRODISCECTOMY Left 07/13/2016   Procedure: LUMBAR LAMINECTOMY AND FORAMINOTOMY Lumbar two, three, Lumbar three-four, Lumbar four-five, LEFT Lumbar five-Sacral one DISECTOMY;  Surgeon: Newman Pies, MD;  Location: [place];  Service: Neurosurgery;  Laterality: Left;   TOE SURGERY  1997    TONSILLECTOMY     TOTAL HIP ARTHROPLASTY Left 2007   TOTAL HIP ARTHROPLASTY Right 2010   TOTAL KNEE ARTHROPLASTY Right 06/25/2014   Procedure: RIGHT TOTAL KNEE ARTHROPLASTY;  Surgeon: Gearlean Alf, MD;  Location: [facility];  Service: Orthopedics;  Laterality: Right;   VENTRICULOPERITONEAL SHUNT Right 07/09/2022   Procedure: SHUNT INSERTION VENTRICULAR-PERITONEAL;  Surgeon: Newman Pies, MD;  Location: [place];  Service: Neurosurgery;  Laterality: Right;   Patient Active Problem List   Diagnosis Date Noted   (Idiopathic) normal pressure hydrocephalus ([place]) 07/09/2022   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[place]) 02/05/2022   Gait disturbance 02/05/2022   Benign essential tremor 02/05/2022   Chronic anticoagulation 02/05/2022   Atrial fibrillation ([place]) 02/08/2019   Lumbar stenosis with neurogenic claudication 07/13/2016   Annual physical exam>>>>>>>>>>>>>>>>>> 05/04/2016   PCP NOTES>>>>>>>>>>>>>>>>>>>>>>>>>>> 12/31/2015   Edema 02/19/2015   OA (osteoarthritis) of knee 06/25/2014   Unspecified sleep apnea 06/18/2014   Multiple thyroid nodules    Tremor    Carotid artery disease ([facility])    Erectile dysfunction 05/31/2012   Coronary artery disease    Venous insufficiency    Hyperglycemia 11/09/2008   MELANOMA, HX OF 11/09/2008  COLONIC POLYPS, HYPERPLASTIC 05/10/2008   Hypothyroidism 08/10/2007   HYPERLIPIDEMIA 08/10/2007   Essential hypertension 08/10/2007   DJD (degenerative joint disease) 08/08/2007   MURMUR 08/08/2007   BENIGN PROSTATIC HYPERTROPHY, HX OF 08/05/2007     THERAPY DIAG:  Other abnormalities of gait and mobility  Muscle weakness (generalized)   PCP: [name], MD   REFERRING PROVIDER: Newman Pies, MD   REFERRING DIAG: G91.1 (ICD-10-CM) - Obstructive hydrocephalus R26.9 (ICD-10-CM) - Gait disturbance   Rationale for Evaluation and Treatment: Rehabilitation   THERAPY DIAG:  Other abnormalities of gait and mobility   Muscle weakness (generalized)    ONSET DATE: chronic - has done PT before multiple times for strength, balance etc   SUBJECTIVE:  I forgot my cane today.                                                                                                              SUBJECTIVE STATEMENT:   Eval: Pt is referred to OPPT for gait training following VP shunt placement 07/09/22 for communicating hydrocephalus. Has done PT multiple times before and wants to do it again for strength, walking and balance.  He uses a SPC in community and either furniture surfs or uses cane at home. He feels he can do some walking without it.   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  His wife reports he can't go shopping with her due to poor endurance.    PERTINENT HISTORY:  VP shunt placed 07/09/22 for communicating hydrocephalus PMH includes lumbar surgery, bil THA, Rt TKA, a-fib, CAD, coronary angioplasty with stent, compromised ejection fraction   PAIN:  PAIN:  Are you having pain? No       PRECAUTIONS: Other: heart history, VP shunt placed 06/2022 for communicating hydrocephalus   WEIGHT BEARING RESTRICTIONS: No   FALLS:  Has patient fallen in last 6 months? No   LIVING ENVIRONMENT: Lives with: lives with their spouse Lives in: House/apartment Stairs: Yes: Internal: 14 steps; can reach both and External: 4 steps; can reach both, bedroom on main level  Has following equipment at home: Single point cane - uses it in community and furniture surfs at home or uses cane   OCCUPATION: retired   PLOF: Independent with household mobility with device and Independent with community mobility with device   PATIENT GOALS: strength, balance and gait   NEXT MD VISIT:    OBJECTIVE:    DIAGNOSTIC FINDINGS:  N/a   PATIENT SURVEYS:      SCREENING FOR RED FLAGS: Bowel or bladder incontinence: Yes: leaking after urination and if gets too full Spinal tumors: No Cauda equina syndrome: No Compression fracture: No Abdominal  aneurysm: No   COGNITION: Overall cognitive status: Within functional limits for tasks assessed                          SENSATION: WFL   MUSCLE LENGTH: Hamstrings: Right 45 deg; Left 45 deg     POSTURE: flexed trunk  and weight shift right   PALPATION: N/a   LUMBAR ROM:    AROM eval  Flexion Flexes knees with fingers to mid shin  Extension NT  Right lateral flexion 5  Left lateral flexion 8  Right rotation 25%  Left rotation 25%   (Blank rows = not tested)   LOWER EXTREMITY ROM:    Hip ROM grossly limited in ER 50% bil   LOWER EXTREMITY MMT:   Grossly 4/5 on Rt Poor glut med and quad activation in stance phase of gait   LUMBAR SPECIAL TESTS:      FUNCTIONAL TESTS:  10/08/22:  5x sit to stand no UE assist 14 sec TUG:16 sec with SPC  Eval: 5 times sit to stand: 17 sec mod/max use of hands on chair Timed up and go (TUG): 22 sec with SPC Berg Balance Scale: 39/52   GAIT: Distance walked: within clinic Assistive device utilized: Single point cane Level of assistance: Modified independence Comments: flexed trunk, flexed Rt knee and hip with Rt lateral lean during Rt stance phase of gait, small step length bil   TODAY'S TREATMENT:  10/08/22: NuStep L5 x 10' PT present to monitor and discuss progress 5x sit to stand no UE assist 14 sec Sidestepping in parallel bars green loop at ankles 3 laps Hip abd and ext bil green tband Fwd/bwd march walk in parallel bars with 2lb ankle weights x 3 laps  Stagger stance with multi-directional arm reaches and head turns  TUG: two trials 21 sec and 16 sec with [facility]  10/06/22: NuStep L5 x 10' PT present to monitor and discuss progress LAQ 3lb 2x15 bil Sit to stand from chair 1x8, then 1x5 with time taken to stand tall (squeeze gluteals, shoulders back), no UE assist Seated 5lb kbell chest press and overhead press x 5 rounds Standing blue tband row 2x15 Stagger stance step and hold x 5" x 10, then stagger stance facing wall  UE reach taps to wall overhead, cross body and chest height Stagger stance with head turns x 5 each foot position Gait without AD with CGA around gym and down long hallway Update HEP: counter stagger stance with head turns and UE reaches  10/01/2022: NuStep L5 x 10' PT present to monitor and discuss progress LAQ 2x15 2.5lb bil Sit to stand on low mat + pad with pad under feet x 10 without UE assist Walking in // bars with red theraband around knees. Side stepping 6 laps, forward and backwards walking with colored dots for cues for big steps.  Standing row blue band  2x10  Standing bil shoulder ext green band 2x10     PATIENT EDUCATION:  Education details: Access Code: [email or web address] Person educated: Patient and Spouse Education method: Explanation, Demonstration, and Handouts Education comprehension: verbalized understanding and returned demonstration   HOME EXERCISE PROGRAM: Access Code: [email or web address] URL: [email or web address] Date: 09/30/2022 Prepared by: Venetia Night Georgeann Brinkman  Exercises - Seated Long Arc Quad  - 1 x daily - 7 x weekly - 1 sets - 10 reps - 5 hold - Standing Hip Abduction with Counter Support  - 1 x daily - 7 x weekly - 1 sets - 10 reps - Standing Hip Extension with Counter Support  - 1 x daily - 7 x weekly - 1 sets - 10 reps - Heel Raises with Counter Support  - 1 x daily - 7 x weekly - 1 sets - 10 reps - Seated Hip Adduction Isometrics with Ball  - 1 x daily -  7 x weekly - 1 sets - 10 reps - Seated March  - 1 x daily - 7 x weekly - 1 sets - 10 reps - Ankle Pumps in Elevation  - 1 x daily - 7 x weekly - 1 sets - 10 reps - Forward Step Up  - 3 x daily - 7 x weekly - 2 sets - 10 reps - Standing Forward Toe Taps on Box (BKA)  - 3 x daily - 7 x weekly - 2 sets - 10 reps - Sit to Stand Without Arm Support  - 3 x daily - 7 x weekly - 1 sets - 10 reps - Standing Row with Anchored Resistance  - 1 x daily - 7 x weekly - 2 sets - 10 reps - Shoulder extension with  resistance - Neutral  - 1 x daily - 7 x weekly - 2 sets - 10 reps   ASSESSMENT:   CLINICAL IMPRESSION: Pt has improved TUG and 5x sit to stand, meeting several goals today.  His gait is much improved with use of SPC, taking big steps and clearing feet well and consistently.  He uses lateral trunk lean bil in stance without SPC which is mostly controlled with use of cane.  He demos ability to achieve stagger stance position with supervision with overlay of head turns and UE reaches with good stability today.  Next visit will add dynamic step with reach.     OBJECTIVE IMPAIRMENTS: Abnormal gait, cardiopulmonary status limiting activity, decreased activity tolerance, decreased balance, decreased coordination, decreased endurance, decreased knowledge of condition, decreased mobility, difficulty walking, decreased ROM, decreased strength, decreased safety awareness, hypomobility, increased edema, impaired flexibility, improper body mechanics, and postural dysfunction.    ACTIVITY LIMITATIONS: carrying, lifting, bending, standing, squatting, stairs, transfers, dressing, and locomotion level   PARTICIPATION LIMITATIONS: meal prep, cleaning, laundry, shopping, community activity, and yard work   PERSONAL FACTORS: Age, Time since onset of injury/illness/exacerbation, and 3+ comorbidities: + cardiac history, hydrocephalus with shunt, LE edema, Rt LE weakness with Hx of THA and TKA  are also affecting patient's functional outcome.    REHAB POTENTIAL: Good   CLINICAL DECISION MAKING: Stable/uncomplicated   EVALUATION COMPLEXITY: Low     GOALS: Goals reviewed with patient? Yes   SHORT TERM GOALS: Target date: 09/24/22   Pt will be ind with initial HEP Baseline: Goal status: met   2.  Pt will improve 5X sit to stand to </= 15 sec with min use of UE Baseline: 17 sec with mod/max use of UE Goal status: met, 14 sec   3.  Pt will demo improved quad and lateral hip muscle activations on Rt with stance  phase of gait. Baseline:  Goal status: met   4.  Improve TUG to 19 sec or less. Baseline: 22 sec Goal status: met, 16 sec      LONG TERM GOALS: Target date: 10/22/22   Pt will be ind with advanced HEP to maintain gains from PT. Baseline:  Goal status: ongoing   2.  Pt will improve TUG to 17 sec or less to demo improved efficiency and safety with functional mobility. Baseline:  Goal status: met, 16 sec   3.  Improve 5X sit to stand to 13 sec or less with min or no use of UE to demo reduced fall risk Baseline: 17 sec Goal status: ongoing, 14 sec   4.  Pt will demo improved gait endurance to allow for a 3 min walk test. Baseline:  Goal  status: INITIAL   5.  Pt will improve LE strength to at least 4+/5 on Rt for improved gait safety, transfers and stairs Baseline: 4/5 Goal status: INITIAL   6.  Improve BERG Balance Test to at least 44/52 to demo improved static and dynamic balance to reduce fall risk. Baseline: 39/52 Goal status: INITIAL   PLAN:   PT FREQUENCY: 2x/week   PT DURATION: 8 weeks   PLANNED INTERVENTIONS: Therapeutic exercises, Therapeutic activity, Neuromuscular re-education, Balance training, Gait training, Patient/Family education, Self Care, Joint mobilization, Stair training, DME instructions, Electrical stimulation, Spinal mobilization, Cryotherapy, Moist heat, and Manual therapy.   PLAN FOR NEXT SESSION: 10th visit PN next time, try 3MWT, add dynamic step with reach, continue balance, strength and endurance  Eissa Buchberger, PT 10/08/22 1:40 PM

## 2022-10-09 ENCOUNTER — Encounter: Payer: Self-pay | Admitting: Internal Medicine

## 2022-10-09 ENCOUNTER — Ambulatory Visit (INDEPENDENT_AMBULATORY_CARE_PROVIDER_SITE_OTHER): Payer: Medicare Other | Admitting: Internal Medicine

## 2022-10-09 VITALS — BP 132/82 | HR 74 | Temp 98.2°F | Resp 18 | Ht 69.0 in | Wt 201.0 lb

## 2022-10-09 DIAGNOSIS — R601 Generalized edema: Secondary | ICD-10-CM

## 2022-10-09 DIAGNOSIS — I4811 Longstanding persistent atrial fibrillation: Secondary | ICD-10-CM

## 2022-10-09 DIAGNOSIS — R739 Hyperglycemia, unspecified: Secondary | ICD-10-CM | POA: Diagnosis not present

## 2022-10-09 DIAGNOSIS — R600 Localized edema: Secondary | ICD-10-CM

## 2022-10-09 DIAGNOSIS — I10 Essential (primary) hypertension: Secondary | ICD-10-CM | POA: Diagnosis not present

## 2022-10-09 DIAGNOSIS — E039 Hypothyroidism, unspecified: Secondary | ICD-10-CM | POA: Diagnosis not present

## 2022-10-09 DIAGNOSIS — E782 Mixed hyperlipidemia: Secondary | ICD-10-CM | POA: Diagnosis not present

## 2022-10-09 LAB — ALT: ALT: 14 U/L (ref 0–53)

## 2022-10-09 LAB — AST: AST: 17 U/L (ref 0–37)

## 2022-10-09 LAB — BASIC METABOLIC PANEL
BUN: 24 mg/dL — ABNORMAL HIGH (ref 6–23)
CO2: 27 mEq/L (ref 19–32)
Calcium: 9.3 mg/dL (ref 8.4–10.5)
Chloride: 105 mEq/L (ref 96–112)
Creatinine, Ser: 1.01 mg/dL (ref 0.40–1.50)
GFR: 68.27 mL/min (ref >60.00)
Glucose, Bld: 88 mg/dL (ref 70–99)
Potassium: 4.1 mEq/L (ref 3.5–5.1)
Sodium: 143 mEq/L (ref 135–145)

## 2022-10-09 LAB — TSH: TSH: 3.64 u[IU]/mL (ref 0.35–5.50)

## 2022-10-09 LAB — HEMOGLOBIN A1C: Hgb A1c MFr Bld: 6.3 % (ref 4.6–6.5)

## 2022-10-09 MED ORDER — FUROSEMIDE 40 MG PO TABS: ORAL_TABLET | ORAL | Status: DC

## 2022-10-09 MED ORDER — METOPROLOL SUCCINATE ER 25 MG PO TB24: ORAL_TABLET | ORAL | Status: DC

## 2022-10-09 NOTE — Patient Instructions (Addendum)
Please bring [place] a copy of your Healthcare Power of Attorney for your chart.    Check the  blood pressure regularly BP GOAL is between 110/65 and  135/85. If it is consistently higher or lower, let me know  Also check his heart rate, provide metoprolol half tablet only if the heart rate is more than 80.    GO TO THE LAB : Get the blood work     GO TO THE FRONT DESK, [place] Come back for a well exam in 3 months

## 2022-10-09 NOTE — Progress Notes (Signed)
Subjective:    Patient ID: [name], male    DOB: [birth year], 87 y.o.   MRN: [id number]  DOS:  10/09/2022 Type of visit - description: f/u, here with his wife  Since the last visit, had a ventriculoperitoneal shunt placed.  Also doing physical therapy. Wife reports that he walks has improved, he seems more alert, less shuffling his feet.  He wonders about Lasix, they decided to decreased to once daily dosing because of excessive urination.  The patient reports lower extremity edema is at baseline and no shortness of breath.  Also taking metoprolol as needed only, I reviewed her ambulatory BPs and heart rate.  Denies any headache, dizziness. No diplopia, slurred speech or motor deficits. Memory is slightly decreased but at baseline. No recent falls   Review of Systems See above   Past Medical History:  Diagnosis Date   A-fib [facility])    Arthritis    BPH (benign prostatic hypertrophy)    Carotid artery disease ([facility])    Doppler, December 08, 2011, 00 51% R. ICA, 88-41% LICA, followup 1 year   Coronary artery disease    DES Circumflex or CVA 2006  /  clear, February, 2011, EF 70%, no ischemia or   Ejection fraction    EF 60%, echo, 2009, mildly calcified aortic leaflets   History of colonic polyps    Hyperlipidemia    Hypertension    Hypothyroidism    Lumbar radiculopathy    Multiple thyroid nodules    Avascular echogenic areas noted in the right thyroid at the time of carotid Doppler.    PONV (postoperative nausea and vomiting)    "nausea with first hip surgery"   Skin cancer (melanoma) ([place])    PMH of    Spinal stenosis, lumbar    Tremor    Fine tremor right upper extremity   Venous insufficiency     Past Surgical History:  Procedure Laterality Date   COLONOSCOPY     CORONARY ANGIOPLASTY WITH STENT PLACEMENT  2006   x 2 stents; DES to CX and dRCA '06   KNEE SURGERY  1970's   "chipped bone"   LUMBAR LAMINECTOMY/DECOMPRESSION MICRODISCECTOMY Left 07/13/2016    Procedure: LUMBAR LAMINECTOMY AND FORAMINOTOMY Lumbar two, three, Lumbar three-four, Lumbar four-five, LEFT Lumbar five-Sacral one DISECTOMY;  Surgeon: Newman Pies, MD;  Location: [place];  Service: Neurosurgery;  Laterality: Left;   TOE SURGERY  1997   TONSILLECTOMY     TOTAL HIP ARTHROPLASTY Left 2007   TOTAL HIP ARTHROPLASTY Right 2010   TOTAL KNEE ARTHROPLASTY Right 06/25/2014   Procedure: RIGHT TOTAL KNEE ARTHROPLASTY;  Surgeon: Gearlean Alf, MD;  [facility];  Service: Orthopedics;  Laterality: Right;   VENTRICULOPERITONEAL SHUNT Right 07/09/2022   Procedure: SHUNT INSERTION VENTRICULAR-PERITONEAL;  Surgeon: Newman Pies, MD;  Location: [place];  Service: Neurosurgery;  Laterality: Right;    Current Outpatient Medications  Medication Instructions   ascorbic acid (VITAMIN C) 500 mg, Oral, Daily   aspirin 81 mg, Oral, Daily   atorvastatin (LIPITOR) 40 MG tablet TAKE 1 AND 1/2 TABLETS(60 MG) BY MOUTH AT BEDTIME   benazepril (LOTENSIN) 30 mg, Oral, Daily   diltiazem (DILACOR XR) 180 mg, Oral, Daily   docusate sodium (COLACE) 100 mg, Oral, 2 times daily   ELIQUIS 5 MG TABS tablet TAKE 1 TABLET BY MOUTH TWICE DAILY   furosemide (LASIX) 40 MG tablet Take 1 1/2 tablet by mouth once daily   levothyroxine (SYNTHROID) 175 mcg, Oral, Daily  before breakfast   LUMIGAN 0.01 % SOLN 1 drop, Both Eyes, Daily at bedtime   metoprolol succinate (TOPROL-XL) 25 MG 24 hr tablet 1/2 tablet a day as needed for pulse > 80   nitroGLYCERIN (NITROSTAT) 0.4 MG SL tablet PLACE 1 TABLET UNDER THE TONGUE EVERY 5 MINUTES AS NEEDED FOR CHEST PAIN   Timolol Maleate 0.5 % (DAILY) SOLN 1 drop, Ophthalmic, 2 times daily, At breakfast and evening meals   triamcinolone cream (KENALOG) 0.1 % 1 Application, Topical, 2 times daily       Objective:   Physical Exam BP 132/82   Pulse 74   Temp 98.2 F (36.8 C) (Oral)   Resp 18   Ht '5\' 9"'$  (1.753 m)   Wt 201 lb (91.2 kg)   SpO2 97%   BMI 29.68 kg/m    General:   Well developed, NAD, BMI noted. HEENT:  Normocephalic . Face symmetric, atraumatic Lungs:  CTA B Normal respiratory effort, no intercostal retractions, no accessory muscle use. Heart: Distant heart sounds, seen regular Lower extremities: Both legs are wrapped.  Edema at baseline per patient and his wife. Skin: Not pale. Not jaundice Neurologic:  alert & oriented X3.  Speech normal, gait assisted with cane.  Compared to previous visits, shuffling is less noticeable.  He seems more steady. Psych--  Cognition and judgment appear intact.  Cooperative with normal attention span and concentration.  Behavior appropriate. No anxious or depressed appearing.      Assessment    Assessment  (Transfer from  Dr. Linna Darner [phone]) Hyperglycemia, A1c 6.2 2013 HTN Hyperlipidemia Hypothyroidism  Thyroid US 2014-- no nodules, inhomogeneous  CV: --CAD: stents 2006;  low risk nuclear scan 2011, 03-2016 --A Fib dx 09/2018 at regular crads OV, on Eliquis, EF --Carotid artery disease [place] 12/2014:   Stable, 1-39% RICA stenosis. Stable, 97-41% LICA stenosis. [place] 12/2015  [place] 04/2020: 0-39% B R leg edema, [place] (-)   DVT 08-2014 and 01/2019 [facility] DJD: Dr Maureen Ralphs, back surgery Dr Arnoldo Morale 06-2016 Venous insufficiency, chronic R>L edema Tremor Glaucoma H/o skin cancer , denies Melanoma; sees derm, had a visit ~ 09/2017, Dr [place] Crane  PLAN: Hyperglycemia: Check A1c. HTN: Ambulatory BPs typically 134/78, 111/57. Due to excessive urination the patient has been receiving Lasix only in the mornings, 1.5 tablets.  Edema is at baseline. Also he gets benazepril, diltiazem and as needed metoprolol. Plan: BMP, continue benazepril, diltiazem, okay to stay on a single dose of Lasix 1.5 tablet in the morning.  Will change metoprolol to prn because his heart rate at home is typically 50s, 60s.  Will use it only if heart rate is more than 80.  This was discussed with the patient's wife and she verbalized  understanding. Atrial fibrillation: See above, anticoagulated. Hypothyroidism: Check TSH Normal pressure hydrocephalus: Status post ventriculoperitoneal shunt insertion and physical therapy.  Clinically improved. RTC CPX 3 months

## 2022-10-11 NOTE — Assessment & Plan Note (Signed)
Hyperglycemia: Check A1c. HTN: Ambulatory BPs typically 134/78, 111/57. Due to excessive urination the patient has been receiving Lasix only in the mornings, 1.5 tablets.  Edema is at baseline. Also he gets benazepril, diltiazem and as needed metoprolol. Plan: BMP, continue benazepril, diltiazem, okay to stay on a single dose of Lasix 1.5 tablet in the morning.  Will change metoprolol to prn because his heart rate at home is typically 50s, 60s.  Will use it only if heart rate is more than 80.  This was discussed with the patient's wife and she verbalized understanding. Atrial fibrillation: See above, anticoagulated. Hypothyroidism: Check TSH Normal pressure hydrocephalus: Status post ventriculoperitoneal shunt insertion and physical therapy.  Clinically improved. RTC CPX 3 months

## 2022-10-13 ENCOUNTER — Encounter: Payer: Self-pay | Admitting: Physical Therapy

## 2022-10-13 ENCOUNTER — Ambulatory Visit: Payer: Medicare Other | Admitting: Physical Therapy

## 2022-10-13 DIAGNOSIS — M6281 Muscle weakness (generalized): Secondary | ICD-10-CM

## 2022-10-13 DIAGNOSIS — R2689 Other abnormalities of gait and mobility: Secondary | ICD-10-CM | POA: Diagnosis not present

## 2022-10-13 NOTE — Therapy (Signed)
OUTPATIENT PHYSICAL THERAPY TREATMENT NOTE  Progress Note Reporting Period 08/27/22 to 10/13/22  See note below for Objective Data and Assessment of Progress/Goals.       Patient Name: [name] MRN: [id number] DOB:November 03, 1935, 87 y.o., male Today's Date: 10/13/2022  END OF SESSION:   PT End of Session - 10/13/22 1236     Visit Number 10    Date for PT Re-Evaluation 10/22/22    Authorization Type [employer] Medicare    Progress Note Due on Visit 20    PT Start Time 1234    PT Stop Time 1318    PT Time Calculation (min) 44 min    Activity Tolerance Patient tolerated treatment well    Behavior During Therapy [facility] for tasks assessed/performed                     Past Medical History:  Diagnosis Date   A-fib ([place])    Arthritis    BPH (benign prostatic hypertrophy)    Carotid artery disease ([place])    Doppler, December 08, 2011, 00 25% R. ICA, 63-89% LICA, followup 1 year   Coronary artery disease    DES Circumflex or CVA 2006  /  clear, February, 2011, EF 70%, no ischemia or   Ejection fraction    EF 60%, echo, 2009, mildly calcified aortic leaflets   History of colonic polyps    Hyperlipidemia    Hypertension    Hypothyroidism    Lumbar radiculopathy    Multiple thyroid nodules    Avascular echogenic areas noted in the right thyroid at the time of carotid Doppler.    PONV (postoperative nausea and vomiting)    "nausea with first hip surgery"   Skin cancer (melanoma) ([place])    PMH of    Spinal stenosis, lumbar    Tremor    Fine tremor right upper extremity   Venous insufficiency    Past Surgical History:  Procedure Laterality Date   COLONOSCOPY     CORONARY ANGIOPLASTY WITH STENT PLACEMENT  2006   x 2 stents; DES to CX and dRCA '06   KNEE SURGERY  1970's   "chipped bone"   LUMBAR LAMINECTOMY/DECOMPRESSION MICRODISCECTOMY Left 07/13/2016   Procedure: LUMBAR LAMINECTOMY AND FORAMINOTOMY Lumbar two, three, Lumbar three-four, Lumbar four-five, LEFT Lumbar  five-Sacral one DISECTOMY;  Surgeon: Newman Pies, MD;  Location: [facility];  Service: Neurosurgery;  Laterality: Left;   TOE SURGERY  1997   TONSILLECTOMY     TOTAL HIP ARTHROPLASTY Left 2007   TOTAL HIP ARTHROPLASTY Right 2010   TOTAL KNEE ARTHROPLASTY Right 06/25/2014   Procedure: RIGHT TOTAL KNEE ARTHROPLASTY;  Surgeon: Gearlean Alf, MD;  Location: [facility];  Service: Orthopedics;  Laterality: Right;   VENTRICULOPERITONEAL SHUNT Right 07/09/2022   Procedure: SHUNT INSERTION VENTRICULAR-PERITONEAL;  Surgeon: Newman Pies, MD;  Location: [place];  Service: Neurosurgery;  Laterality: Right;   Patient Active Problem List   Diagnosis Date Noted   (Idiopathic) normal pressure hydrocephalus ([place]) 07/09/2022   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[place]) 02/05/2022   Gait disturbance 02/05/2022   Benign essential tremor 02/05/2022   Chronic anticoagulation 02/05/2022   Atrial fibrillation ([place]) 02/08/2019   Lumbar stenosis with neurogenic claudication 07/13/2016   Annual physical exam>>>>>>>>>>>>>>>>>> 05/04/2016   PCP NOTES>>>>>>>>>>>>>>>>>>>>>>>>>>> 12/31/2015   Edema 02/19/2015   OA (osteoarthritis) of knee 06/25/2014   Unspecified sleep apnea 06/18/2014   Multiple thyroid nodules    Tremor    Carotid artery disease ([facility])    Erectile  dysfunction 05/31/2012   Coronary artery disease    Venous insufficiency    Hyperglycemia 11/09/2008   MELANOMA, HX OF 11/09/2008   COLONIC POLYPS, HYPERPLASTIC 05/10/2008   Hypothyroidism 08/10/2007   HYPERLIPIDEMIA 08/10/2007   Essential hypertension 08/10/2007   DJD (degenerative joint disease) 08/08/2007   MURMUR 08/08/2007   BENIGN PROSTATIC HYPERTROPHY, HX OF 08/05/2007     THERAPY DIAG:  Other abnormalities of gait and mobility  Muscle weakness (generalized)   PCP: [name], MD   REFERRING PROVIDER: Newman Pies, MD   REFERRING DIAG: G91.1 (ICD-10-CM) - Obstructive hydrocephalus R26.9 (ICD-10-CM) - Gait disturbance   Rationale for  Evaluation and Treatment: Rehabilitation   THERAPY DIAG:  Other abnormalities of gait and mobility   Muscle weakness (generalized)   ONSET DATE: chronic - has done PT before multiple times for strength, balance etc   SUBJECTIVE:  I am getting better. I am doing my HEP every day with help from my wife.  I went up the stairs the other day.  I am stronger and more confident that I won't fall.                                                                                                             SUBJECTIVE STATEMENT:   Eval: Pt is referred to OPPT for gait training following VP shunt placement 07/09/22 for communicating hydrocephalus. Has done PT multiple times before and wants to do it again for strength, walking and balance.  He uses a SPC in community and either furniture surfs or uses cane at home. He feels he can do some walking without it.   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  His wife reports he can't go shopping with her due to poor endurance.    PERTINENT HISTORY:  VP shunt placed 07/09/22 for communicating hydrocephalus PMH includes lumbar surgery, bil THA, Rt TKA, a-fib, CAD, coronary angioplasty with stent, compromised ejection fraction   PAIN:  PAIN:  Are you having pain? No       PRECAUTIONS: Other: heart history, VP shunt placed 06/2022 for communicating hydrocephalus   WEIGHT BEARING RESTRICTIONS: No   FALLS:  Has patient fallen in last 6 months? No   LIVING ENVIRONMENT: Lives with: lives with their spouse Lives in: House/apartment Stairs: Yes: Internal: 14 steps; can reach both and External: 4 steps; can reach both, bedroom on main level  Has following equipment at home: Single point cane - uses it in community and furniture surfs at home or uses cane   OCCUPATION: retired   PLOF: Independent with household mobility with device and Independent with community mobility with device   PATIENT GOALS: strength, balance and gait   NEXT  MD VISIT:    OBJECTIVE:    DIAGNOSTIC FINDINGS:  N/a   PATIENT SURVEYS:      SCREENING FOR RED FLAGS: Bowel or bladder incontinence: Yes: leaking after urination and if gets too full Spinal tumors: No Cauda equina syndrome: No Compression fracture: No Abdominal aneurysm: No   COGNITION:  Overall cognitive status: Within functional limits for tasks assessed                          SENSATION: WFL   MUSCLE LENGTH: Hamstrings: Right 45 deg; Left 45 deg     POSTURE:  10/13/22: improved postural awareness, needs intermittent cueing to stand tall  Evaluation: flexed trunk  and weight shift right   PALPATION: N/a   LUMBAR ROM:    AROM eval 10/13/22  Flexion Flexes knees with fingers to mid shin Flexes knees with fingers to mid shin  Extension NT 8  Right lateral flexion 5 8  Left lateral flexion 8 8  Right rotation 25% 50%  Left rotation 25% 50%   (Blank rows = not tested)   LOWER EXTREMITY ROM:    Hip ROM grossly limited in ER 50% bil   LOWER EXTREMITY MMT:   10/13/22: 4+/5 bil LE  Eval: Grossly 4/5 on Rt Poor glut med and quad activation in stance phase of gait   LUMBAR SPECIAL TESTS:      FUNCTIONAL TESTS:  10/08/22:  5x sit to stand no UE assist 14 sec TUG:16 sec with SPC  Eval: 5 times sit to stand: 17 sec mod/max use of hands on chair Timed up and go (TUG): 22 sec with SPC Berg Balance Scale: 39/52   GAIT: Distance walked: 3MWT Assistive device utilized: Single point cane Level of assistance: Modified independence Comments: with SPC, covers 340' with SOB at 1:30, needs seated break   TODAY'S TREATMENT:  10/13/22 NuStep L5 x 6' PT present to review status, goals 3MWT with SPC: covered 28' with single seated break at 1:30, due to SOB BERG balance test (see below), with repeated practice of challenging items Stairs review 4x4 steps with Rt rail: Pt pulling on Rt rail with alt pattern, will be safer to do step to using LE>UE effort   [facility] PT  Assessment - 10/13/22 0001       Standardized Balance Assessment   Standardized Balance Assessment Berg Balance Test      Berg Balance Test   Sit to Stand Able to stand  independently using hands    Standing Unsupported Able to stand safely 2 minutes    Sitting with Back Unsupported but Feet Supported on Floor or Stool Able to sit safely and securely 2 minutes    Stand to Sit Controls descent by using hands    Transfers Able to transfer safely, minor use of hands    Standing Unsupported with Eyes Closed Able to stand 10 seconds with supervision    Standing Unsupported with Feet Together Able to place feet together independently and stand 1 minute safely    From Standing, Reach Forward with Outstretched Arm Can reach confidently >25 cm (10")    From Standing Position, Pick up Object from Floor Able to pick up shoe, needs supervision    From Standing Position, Turn to Look Behind Over each Shoulder Looks behind one side only/other side shows less weight shift    Turn 360 Degrees Able to turn 360 degrees safely in 4 seconds or less    Standing Unsupported, Alternately Place Feet on Step/Stool Able to stand independently and safely and complete 8 steps in 20 seconds    Standing Unsupported, One Foot in Front Able to take small step independently and hold 30 seconds    Standing on One Leg Tries to lift leg/unable to hold 3 seconds but remains standing  independently    Total Score 46    Berg comment: 46/52              10/08/22: NuStep L5 x 10' PT present to monitor and discuss progress 5x sit to stand no UE assist 14 sec Sidestepping in parallel bars green loop at ankles 3 laps Hip abd and ext bil green tband Fwd/bwd march walk in parallel bars with 2lb ankle weights x 3 laps  Stagger stance with multi-directional arm reaches and head turns  TUG: two trials 21 sec and 16 sec with [facility]  10/06/22: NuStep L5 x 10' PT present to monitor and discuss progress LAQ 3lb 2x15 bil Sit to  stand from chair 1x8, then 1x5 with time taken to stand tall (squeeze gluteals, shoulders back), no UE assist Seated 5lb kbell chest press and overhead press x 5 rounds Standing blue tband row 2x15 Stagger stance step and hold x 5" x 10, then stagger stance facing wall UE reach taps to wall overhead, cross body and chest height Stagger stance with head turns x 5 each foot position Gait without AD with CGA around gym and down long hallway Update HEP: counter stagger stance with head turns and UE reaches    PATIENT EDUCATION:  Education details: Access Code: [email or web address] Person educated: Patient and Spouse Education method: Explanation, Demonstration, and Handouts Education comprehension: verbalized understanding and returned demonstration   HOME EXERCISE PROGRAM: Access Code: [email or web address] URL: [email or web address] Date: 09/30/2022 Prepared by: Venetia Night Makella Buckingham  Exercises - Seated Long Arc Quad  - 1 x daily - 7 x weekly - 1 sets - 10 reps - 5 hold - Standing Hip Abduction with Counter Support  - 1 x daily - 7 x weekly - 1 sets - 10 reps - Standing Hip Extension with Counter Support  - 1 x daily - 7 x weekly - 1 sets - 10 reps - Heel Raises with Counter Support  - 1 x daily - 7 x weekly - 1 sets - 10 reps - Seated Hip Adduction Isometrics with Ball  - 1 x daily - 7 x weekly - 1 sets - 10 reps - Seated March  - 1 x daily - 7 x weekly - 1 sets - 10 reps - Ankle Pumps in Elevation  - 1 x daily - 7 x weekly - 1 sets - 10 reps - Forward Step Up  - 3 x daily - 7 x weekly - 2 sets - 10 reps - Standing Forward Toe Taps on Box (BKA)  - 3 x daily - 7 x weekly - 2 sets - 10 reps - Sit to Stand Without Arm Support  - 3 x daily - 7 x weekly - 1 sets - 10 reps - Standing Row with Anchored Resistance  - 1 x daily - 7 x weekly - 2 sets - 10 reps - Shoulder extension with resistance - Neutral  - 1 x daily - 7 x weekly - 2 sets - 10 reps   ASSESSMENT:   CLINICAL IMPRESSION: Pt has improved  TUG and 5x sit to stand, meeting several goals last week.  Today he particiapted in 3MWT to assess endurance, and covered 340' but did need seated breaks at 1:30 and 3" secondary to SOB.  Discussed how he could work on this at home to build cardiovascular endurance.  BERG has improved by 7 points today.  Pt and his wife are very pleased with his progress in strength, gait control, transfers independence and overall activity  tolerance.     OBJECTIVE IMPAIRMENTS: Abnormal gait, cardiopulmonary status limiting activity, decreased activity tolerance, decreased balance, decreased coordination, decreased endurance, decreased knowledge of condition, decreased mobility, difficulty walking, decreased ROM, decreased strength, decreased safety awareness, hypomobility, increased edema, impaired flexibility, improper body mechanics, and postural dysfunction.    ACTIVITY LIMITATIONS: carrying, lifting, bending, standing, squatting, stairs, transfers, dressing, and locomotion level   PARTICIPATION LIMITATIONS: meal prep, cleaning, laundry, shopping, community activity, and yard work   PERSONAL FACTORS: Age, Time since onset of injury/illness/exacerbation, and 3+ comorbidities: + cardiac history, hydrocephalus with shunt, LE edema, Rt LE weakness with Hx of THA and TKA  are also affecting patient's functional outcome.    REHAB POTENTIAL: Good   CLINICAL DECISION MAKING: Stable/uncomplicated   EVALUATION COMPLEXITY: Low     GOALS: Goals reviewed with patient? Yes   SHORT TERM GOALS: Target date: 09/24/22   Pt will be ind with initial HEP Baseline: Goal status: met   2.  Pt will improve 5X sit to stand to </= 15 sec with min use of UE Baseline: 17 sec with mod/max use of UE Goal status: met, 14 sec   3.  Pt will demo improved quad and lateral hip muscle activations on Rt with stance phase of gait. Baseline:  Goal status: met   4.  Improve TUG to 19 sec or less. Baseline: 22 sec Goal status: met, 16  sec      LONG TERM GOALS: Target date: 10/22/22   Pt will be ind with advanced HEP to maintain gains from PT. Baseline:  Goal status: ongoing   2.  Pt will improve TUG to 17 sec or less to demo improved efficiency and safety with functional mobility. Baseline:  Goal status: met, 16 sec   3.  Improve 5X sit to stand to 13 sec or less with min or no use of UE to demo reduced fall risk Baseline: 17 sec Goal status: ongoing, 14 sec   4.  Pt will demo improved gait endurance to allow for a 3 min walk test. Baseline: covered 340' on 10/13/22 Goal status: needed break at 1:30 for SOB   5.  Pt will improve LE strength to at least 4+/5 on Rt for improved gait safety, transfers and stairs Baseline: 4/5 Goal status: INITIAL   6.  Improve BERG Balance Test to at least 44/52 to demo improved static and dynamic balance to reduce fall risk. Baseline: 39/52 Goal status: met on 10/13/22   PLAN:   PT FREQUENCY: 2x/week   PT DURATION: 8 weeks   PLANNED INTERVENTIONS: Therapeutic exercises, Therapeutic activity, Neuromuscular re-education, Balance training, Gait training, Patient/Family education, Self Care, Joint mobilization, Stair training, DME instructions, Electrical stimulation, Spinal mobilization, Cryotherapy, Moist heat, and Manual therapy.   PLAN FOR NEXT SESSION: repeat 3MWT, add dynamic step with reach, continue balance, strength and endurance  Milind Raether, PT 10/13/22 1:44 PM

## 2022-10-14 ENCOUNTER — Ambulatory Visit: Payer: Medicare Other | Admitting: Podiatry

## 2022-10-14 ENCOUNTER — Encounter: Payer: Self-pay | Admitting: Podiatry

## 2022-10-14 VITALS — BP 130/69

## 2022-10-14 DIAGNOSIS — B351 Tinea unguium: Secondary | ICD-10-CM | POA: Diagnosis not present

## 2022-10-14 DIAGNOSIS — M79674 Pain in right toe(s): Secondary | ICD-10-CM

## 2022-10-14 DIAGNOSIS — M79675 Pain in left toe(s): Secondary | ICD-10-CM | POA: Diagnosis not present

## 2022-10-14 DIAGNOSIS — I739 Peripheral vascular disease, unspecified: Secondary | ICD-10-CM | POA: Diagnosis not present

## 2022-10-14 DIAGNOSIS — L84 Corns and callosities: Secondary | ICD-10-CM | POA: Diagnosis not present

## 2022-10-14 NOTE — Progress Notes (Signed)
  Subjective:  Patient ID: [name], male    DOB: 04-Apr-1936,  MRN: [id number]  DAVARIOUS TUMBLESON presents to clinic today for at risk foot care. Patient has h/o PAD and corn(s) R 3rd toe and painful thick toenails that are difficult to trim. Painful toenails interfere with ambulation. Aggravating factors include wearing enclosed shoe gear. Pain is relieved with periodic professional debridement. Painful corns are aggravated when weightbearing when wearing enclosed shoe gear. Pain is relieved with periodic professional debridement.  Chief Complaint  Patient presents with   Nail Problem    RFC PCP=Paz PCP VST-2 weeks ago    New problem(s): None.   PCP is Colon Branch, MD.  Allergies  Allergen Reactions   Norvasc [Amlodipine Besylate] Swelling    Review of Systems: Negative except as noted in the HPI.  Objective: No changes noted in today's physical examination. Vitals:   10/14/22 0943  BP: 130/69   KIEFER OPHEIM is a pleasant 87 y.o. male WD, WN in NAD. AAO x 3.  Vascular Examination: Vascular status with faintly palpable pedal pulses b/l. Diminished PT pulses b/l. Pedal hair sparse b/l. CFT <3 seconds b/l. Trace edema b/l LE.  No pain with calf compression b/l. Skin temperature gradient WNL b/l. Evidence of chronic venous insufficiency b/l LE.  Neurological Examination: Sensation grossly intact b/l with 10 gram monofilament. Vibratory sensation intact b/l.   Dermatological Examination: Pedal skin with normal turgor, texture and tone b/l. Toenails 1-5 b/l thick, discolored, elongated with subungual debris and pain on dorsal palpation.   Hyperkeratotic lesion(s) distal tip of right 3rd toe, submet head 1 right foot.  No erythema, no edema, no drainage, no fluctuance.  Musculoskeletal Examination: Muscle strength 5/5 to b/l LE. No pain, crepitus or joint limitation noted with ROM bilateral LE. HAV with bunion deformity noted b/l LE. Hammertoe(s) noted to the left second  digit.  Radiographs: None  Assessment/Plan: 1. Pain due to onychomycosis of toenails of both feet   2. Corns and callosities   3. PAD (peripheral artery disease) ([place])     -Patient's family member present. All questions/concerns addressed on today's visit. -Continue supportive shoe gear daily. -Toenails 1-5 b/l were debrided in length and girth with sterile nail nippers and dremel without iatrogenic bleeding.  -Corn(s) right third digit pared utilizing sterile scalpel blade without incident.and callus(es) submet head 1 right foot were was debrided with dremel. Total number debrided =2. -Patient/POA to call should there be question/concern in the interim.   Return in about 3 months (around 01/13/2023).  Marzetta Board, DPM

## 2022-10-15 ENCOUNTER — Encounter: Payer: Self-pay | Admitting: Physical Therapy

## 2022-10-15 ENCOUNTER — Ambulatory Visit: Payer: Medicare Other | Admitting: Physical Therapy

## 2022-10-15 DIAGNOSIS — M6281 Muscle weakness (generalized): Secondary | ICD-10-CM | POA: Diagnosis not present

## 2022-10-15 DIAGNOSIS — R2689 Other abnormalities of gait and mobility: Secondary | ICD-10-CM | POA: Diagnosis not present

## 2022-10-15 NOTE — Therapy (Signed)
OUTPATIENT PHYSICAL THERAPY TREATMENT NOTE    Patient Name: [name] MRN: [id number] DOB:Apr 22, 1936, 87 y.o., male Today's Date: 10/15/2022  END OF SESSION:   PT End of Session - 10/15/22 1405     Visit Number 11    Date for PT Re-Evaluation 10/22/22    Authorization Type [employer] Medicare    Progress Note Due on Visit 12    PT Start Time 1400    PT Stop Time 1445    PT Time Calculation (min) 45 min    Equipment Utilized During Treatment Gait belt    Activity Tolerance Patient tolerated treatment well    Behavior During Therapy WFL for tasks assessed/performed                      Past Medical History:  Diagnosis Date   A-fib ([place])    Arthritis    BPH (benign prostatic hypertrophy)    Carotid artery disease ([place])    Doppler, December 08, 2011, 00 24% R. ICA, 26-83% LICA, followup 1 year   Coronary artery disease    DES Circumflex or CVA 2006  /  clear, February, 2011, EF 70%, no ischemia or   Ejection fraction    EF 60%, echo, 2009, mildly calcified aortic leaflets   History of colonic polyps    Hyperlipidemia    Hypertension    Hypothyroidism    Lumbar radiculopathy    Multiple thyroid nodules    Avascular echogenic areas noted in the right thyroid at the time of carotid Doppler.    PONV (postoperative nausea and vomiting)    "nausea with first hip surgery"   Skin cancer (melanoma) ([place])    PMH of    Spinal stenosis, lumbar    Tremor    Fine tremor right upper extremity   Venous insufficiency    Past Surgical History:  Procedure Laterality Date   COLONOSCOPY     CORONARY ANGIOPLASTY WITH STENT PLACEMENT  2006   x 2 stents; DES to CX and dRCA '06   KNEE SURGERY  1970's   "chipped bone"   LUMBAR LAMINECTOMY/DECOMPRESSION MICRODISCECTOMY Left 07/13/2016   Procedure: LUMBAR LAMINECTOMY AND FORAMINOTOMY Lumbar two, three, Lumbar three-four, Lumbar four-five, LEFT Lumbar five-Sacral one DISECTOMY;  Surgeon: Newman Pies, MD;  Location: [facility];   Service: Neurosurgery;  Laterality: Left;   TOE SURGERY  1997   TONSILLECTOMY     TOTAL HIP ARTHROPLASTY Left 2007   TOTAL HIP ARTHROPLASTY Right 2010   TOTAL KNEE ARTHROPLASTY Right 06/25/2014   Procedure: RIGHT TOTAL KNEE ARTHROPLASTY;  Surgeon: Gearlean Alf, MD;  Location: [facility];  Service: Orthopedics;  Laterality: Right;   VENTRICULOPERITONEAL SHUNT Right 07/09/2022   Procedure: SHUNT INSERTION VENTRICULAR-PERITONEAL;  Surgeon: Newman Pies, MD;  Location: [facility];  Service: Neurosurgery;  Laterality: Right;   Patient Active Problem List   Diagnosis Date Noted   (Idiopathic) normal pressure hydrocephalus ([place]) 07/09/2022   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[place]) 02/05/2022   Gait disturbance 02/05/2022   Benign essential tremor 02/05/2022   Chronic anticoagulation 02/05/2022   Atrial fibrillation ([place]) 02/08/2019   Lumbar stenosis with neurogenic claudication 07/13/2016   Annual physical exam>>>>>>>>>>>>>>>>>> 05/04/2016   PCP NOTES>>>>>>>>>>>>>>>>>>>>>>>>>>> 12/31/2015   Edema 02/19/2015   OA (osteoarthritis) of knee 06/25/2014   Unspecified sleep apnea 06/18/2014   Multiple thyroid nodules    Tremor    Carotid artery disease ([facility])    Erectile dysfunction 05/31/2012   Coronary artery disease    Venous insufficiency  Hyperglycemia 11/09/2008   MELANOMA, HX OF 11/09/2008   COLONIC POLYPS, HYPERPLASTIC 05/10/2008   Hypothyroidism 08/10/2007   HYPERLIPIDEMIA 08/10/2007   Essential hypertension 08/10/2007   DJD (degenerative joint disease) 08/08/2007   MURMUR 08/08/2007   BENIGN PROSTATIC HYPERTROPHY, HX OF 08/05/2007     THERAPY DIAG:  Other abnormalities of gait and mobility  Muscle weakness (generalized)   PCP: [name], MD   REFERRING PROVIDER: Newman Pies, MD   REFERRING DIAG: G91.1 (ICD-10-CM) - Obstructive hydrocephalus R26.9 (ICD-10-CM) - Gait disturbance   Rationale for Evaluation and Treatment: Rehabilitation   THERAPY DIAG:  Other  abnormalities of gait and mobility   Muscle weakness (generalized)   ONSET DATE: chronic - has done PT before multiple times for strength, balance etc   SUBJECTIVE:  I am getting better. I am doing my HEP every day with help from my wife.  I went up the stairs the other day.  I am stronger and more confident that I won't fall.                                                                                                             SUBJECTIVE STATEMENT:   Eval: Pt is referred to OPPT for gait training following VP shunt placement 07/09/22 for communicating hydrocephalus. Has done PT multiple times before and wants to do it again for strength, walking and balance.  He uses a SPC in community and either furniture surfs or uses cane at home. He feels he can do some walking without it.   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  His wife reports he can't go shopping with her due to poor endurance.    PERTINENT HISTORY:  VP shunt placed 07/09/22 for communicating hydrocephalus PMH includes lumbar surgery, bil THA, Rt TKA, a-fib, CAD, coronary angioplasty with stent, compromised ejection fraction   PAIN:  PAIN:  Are you having pain? No       PRECAUTIONS: Other: heart history, VP shunt placed 06/2022 for communicating hydrocephalus   WEIGHT BEARING RESTRICTIONS: No   FALLS:  Has patient fallen in last 6 months? No   LIVING ENVIRONMENT: Lives with: lives with their spouse Lives in: House/apartment Stairs: Yes: Internal: 14 steps; can reach both and External: 4 steps; can reach both, bedroom on main level  Has following equipment at home: Single point cane - uses it in community and furniture surfs at home or uses cane   OCCUPATION: retired   PLOF: Independent with household mobility with device and Independent with community mobility with device   PATIENT GOALS: strength, balance and gait   NEXT MD VISIT:    OBJECTIVE:    DIAGNOSTIC FINDINGS:  N/a    PATIENT SURVEYS:      SCREENING FOR RED FLAGS: Bowel or bladder incontinence: Yes: leaking after urination and if gets too full Spinal tumors: No Cauda equina syndrome: No Compression fracture: No Abdominal aneurysm: No   COGNITION: Overall cognitive status: Within functional limits for tasks assessed  SENSATION: WFL   MUSCLE LENGTH: Hamstrings: Right 45 deg; Left 45 deg     POSTURE:  10/13/22: improved postural awareness, needs intermittent cueing to stand tall  Evaluation: flexed trunk  and weight shift right   PALPATION: N/a   LUMBAR ROM:    AROM eval 10/13/22  Flexion Flexes knees with fingers to mid shin Flexes knees with fingers to mid shin  Extension NT 8  Right lateral flexion 5 8  Left lateral flexion 8 8  Right rotation 25% 50%  Left rotation 25% 50%   (Blank rows = not tested)   LOWER EXTREMITY ROM:    Hip ROM grossly limited in ER 50% bil   LOWER EXTREMITY MMT:   10/13/22: 4+/5 bil LE  Eval: Grossly 4/5 on Rt Poor glut med and quad activation in stance phase of gait   LUMBAR SPECIAL TESTS:      FUNCTIONAL TESTS:  10/08/22:  5x sit to stand no UE assist 14 sec TUG:16 sec with SPC  Eval: 5 times sit to stand: 17 sec mod/max use of hands on chair Timed up and go (TUG): 22 sec with SPC Berg Balance Scale: 39/52   GAIT: Distance walked: 3MWT Assistive device utilized: Single point cane Level of assistance: Modified independence Comments: with SPC, covers 340' with SOB at 1:30, needs seated break   TODAY'S TREATMENT:  10/15/22 NuStep L5 x 10' PT present to monitor 2lb ankle weights 2x10: hip abd, high knee march, hip ext Seated chest and overhead press x 7 rounds Step over low disc and back x 5 each leg, close supervision and gait belt Fwd step with ipsilateral reach alt x 10, contralateral reach x 10, close supervision and gait belt Stagger stance balance 2x10", with head turns and trunk twists intermittent UE  support Sit to stand x 5 no UE assist 3lb LAQ 2x10 3'12" walk with [facility] covering 332' with seated break at 1:40   10/13/22 NuStep L5 x 6' PT present to review status, goals 3MWT with SPC: covered 63' with single seated break at 1:30, due to SOB BERG balance test (see below), with repeated practice of challenging items Stairs review 4x4 steps with Rt rail: Pt pulling on Rt rail with alt pattern, will be safer to do step to using LE>UE effort   10/08/22: NuStep L5 x 10' PT present to monitor and discuss progress 5x sit to stand no UE assist 14 sec Sidestepping in parallel bars green loop at ankles 3 laps Hip abd and ext bil green tband Fwd/bwd march walk in parallel bars with 2lb ankle weights x 3 laps  Stagger stance with multi-directional arm reaches and head turns  TUG: two trials 21 sec and 16 sec with [facility]    PATIENT EDUCATION:  Education details: Access Code: [email or web address] Person educated: Patient and Spouse Education method: Explanation, Demonstration, and Handouts Education comprehension: verbalized understanding and returned demonstration   HOME EXERCISE PROGRAM: Access Code: [email or web address] URL: [email or web address] Date: 09/30/2022 Prepared by: Venetia Night Swannie Milius  Exercises - Seated Long Arc Quad  - 1 x daily - 7 x weekly - 1 sets - 10 reps - 5 hold - Standing Hip Abduction with Counter Support  - 1 x daily - 7 x weekly - 1 sets - 10 reps - Standing Hip Extension with Counter Support  - 1 x daily - 7 x weekly - 1 sets - 10 reps - Heel Raises with Counter Support  - 1 x daily - 7 x weekly - 1 sets -  10 reps - Seated Hip Adduction Isometrics with Ball  - 1 x daily - 7 x weekly - 1 sets - 10 reps - Seated March  - 1 x daily - 7 x weekly - 1 sets - 10 reps - Ankle Pumps in Elevation  - 1 x daily - 7 x weekly - 1 sets - 10 reps - Forward Step Up  - 3 x daily - 7 x weekly - 2 sets - 10 reps - Standing Forward Toe Taps on Box (BKA)  - 3 x daily - 7 x weekly - 2 sets - 10  reps - Sit to Stand Without Arm Support  - 3 x daily - 7 x weekly - 1 sets - 10 reps - Standing Row with Anchored Resistance  - 1 x daily - 7 x weekly - 2 sets - 10 reps - Shoulder extension with resistance - Neutral  - 1 x daily - 7 x weekly - 2 sets - 10 reps   ASSESSMENT:   CLINICAL IMPRESSION: Pt is unable to clear hurdles for forward step overs. He is able to step over low disc with UE reach today with close supervision without UE assist.  He was able to increase LE therex challenge to standing 2lb hip 3-way with bil UE support.  He was able to demo balance in stagger stance with head turns and trunk twists today but needs UE support for tandem stance.  He needed seated break during 3MWT today at 1:40 for SOB, which was performed end of session when more fatigued.     OBJECTIVE IMPAIRMENTS: Abnormal gait, cardiopulmonary status limiting activity, decreased activity tolerance, decreased balance, decreased coordination, decreased endurance, decreased knowledge of condition, decreased mobility, difficulty walking, decreased ROM, decreased strength, decreased safety awareness, hypomobility, increased edema, impaired flexibility, improper body mechanics, and postural dysfunction.    ACTIVITY LIMITATIONS: carrying, lifting, bending, standing, squatting, stairs, transfers, dressing, and locomotion level   PARTICIPATION LIMITATIONS: meal prep, cleaning, laundry, shopping, community activity, and yard work   PERSONAL FACTORS: Age, Time since onset of injury/illness/exacerbation, and 3+ comorbidities: + cardiac history, hydrocephalus with shunt, LE edema, Rt LE weakness with Hx of THA and TKA  are also affecting patient's functional outcome.    REHAB POTENTIAL: Good   CLINICAL DECISION MAKING: Stable/uncomplicated   EVALUATION COMPLEXITY: Low     GOALS: Goals reviewed with patient? Yes   SHORT TERM GOALS: Target date: 09/24/22   Pt will be ind with initial HEP Baseline: Goal status: met    2.  Pt will improve 5X sit to stand to </= 15 sec with min use of UE Baseline: 17 sec with mod/max use of UE Goal status: met, 14 sec   3.  Pt will demo improved quad and lateral hip muscle activations on Rt with stance phase of gait. Baseline:  Goal status: met   4.  Improve TUG to 19 sec or less. Baseline: 22 sec Goal status: met, 16 sec      LONG TERM GOALS: Target date: 10/22/22   Pt will be ind with advanced HEP to maintain gains from PT. Baseline:  Goal status: ongoing   2.  Pt will improve TUG to 17 sec or less to demo improved efficiency and safety with functional mobility. Baseline:  Goal status: met, 16 sec   3.  Improve 5X sit to stand to 13 sec or less with min or no use of UE to demo reduced fall risk Baseline: 17 sec Goal status: ongoing,  14 sec   4.  Pt will demo improved gait endurance to allow for a 3 min walk test. Baseline: covered 340' on 10/13/22 Goal status: needed break at 1:30 for SOB   5.  Pt will improve LE strength to at least 4+/5 on Rt for improved gait safety, transfers and stairs Baseline: 4/5 Goal status: INITIAL   6.  Improve BERG Balance Test to at least 44/52 to demo improved static and dynamic balance to reduce fall risk. Baseline: 39/52 Goal status: met on 10/13/22   PLAN:   PT FREQUENCY: 2x/week   PT DURATION: 8 weeks   PLANNED INTERVENTIONS: Therapeutic exercises, Therapeutic activity, Neuromuscular re-education, Balance training, Gait training, Patient/Family education, Self Care, Joint mobilization, Stair training, DME instructions, Electrical stimulation, Spinal mobilization, Cryotherapy, Moist heat, and Manual therapy.   PLAN FOR NEXT SESSION: repeat 3MWT, add dynamic step with reach, continue balance, strength and endurance  Joanmarie Tsang, PT 10/15/22 3:01 PM

## 2022-10-20 ENCOUNTER — Encounter: Payer: Self-pay | Admitting: Physical Therapy

## 2022-10-20 ENCOUNTER — Ambulatory Visit: Payer: Medicare Other | Admitting: Physical Therapy

## 2022-10-20 DIAGNOSIS — R2689 Other abnormalities of gait and mobility: Secondary | ICD-10-CM

## 2022-10-20 DIAGNOSIS — M6281 Muscle weakness (generalized): Secondary | ICD-10-CM | POA: Diagnosis not present

## 2022-10-20 NOTE — Therapy (Signed)
OUTPATIENT PHYSICAL THERAPY TREATMENT NOTE    Patient Name: [name] MRN: [id number] DOB:May 12, 1936, 87 y.o., male Today's Date: 10/20/2022  END OF SESSION:   PT End of Session - 10/20/22 1231     Visit Number 12    Date for PT Re-Evaluation 10/22/22    Authorization Type [employer] Medicare    Progress Note Due on Visit 58    PT Start Time 1231    PT Stop Time 1313    PT Time Calculation (min) 42 min    Equipment Utilized During Treatment Gait belt    Activity Tolerance Patient tolerated treatment well    Behavior During Therapy WFL for tasks assessed/performed                       Past Medical History:  Diagnosis Date   A-fib ([place])    Arthritis    BPH (benign prostatic hypertrophy)    Carotid artery disease ([place])    Doppler, December 08, 2011, 00 17% R. ICA, 61-60% LICA, followup 1 year   Coronary artery disease    DES Circumflex or CVA 2006  /  clear, February, 2011, EF 70%, no ischemia or   Ejection fraction    EF 60%, echo, 2009, mildly calcified aortic leaflets   History of colonic polyps    Hyperlipidemia    Hypertension    Hypothyroidism    Lumbar radiculopathy    Multiple thyroid nodules    Avascular echogenic areas noted in the right thyroid at the time of carotid Doppler.    PONV (postoperative nausea and vomiting)    "nausea with first hip surgery"   Skin cancer (melanoma) ([place])    PMH of    Spinal stenosis, lumbar    Tremor    Fine tremor right upper extremity   Venous insufficiency    Past Surgical History:  Procedure Laterality Date   COLONOSCOPY     CORONARY ANGIOPLASTY WITH STENT PLACEMENT  2006   x 2 stents; DES to CX and dRCA '06   KNEE SURGERY  1970's   "chipped bone"   LUMBAR LAMINECTOMY/DECOMPRESSION MICRODISCECTOMY Left 07/13/2016   Procedure: LUMBAR LAMINECTOMY AND FORAMINOTOMY Lumbar two, three, Lumbar three-four, Lumbar four-five, LEFT Lumbar five-Sacral one DISECTOMY;  Surgeon: Newman Pies, MD;  Location: [facility];   Service: Neurosurgery;  Laterality: Left;   TOE SURGERY  1997   TONSILLECTOMY     TOTAL HIP ARTHROPLASTY Left 2007   TOTAL HIP ARTHROPLASTY Right 2010   TOTAL KNEE ARTHROPLASTY Right 06/25/2014   Procedure: RIGHT TOTAL KNEE ARTHROPLASTY;  Surgeon: Gearlean Alf, MD;  Location: [facility];  Service: Orthopedics;  Laterality: Right;   VENTRICULOPERITONEAL SHUNT Right 07/09/2022   Procedure: SHUNT INSERTION VENTRICULAR-PERITONEAL;  Surgeon: Newman Pies, MD;  Location: [facility];  Service: Neurosurgery;  Laterality: Right;   Patient Active Problem List   Diagnosis Date Noted   (Idiopathic) normal pressure hydrocephalus ([place]) 07/09/2022   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[place]) 02/05/2022   Gait disturbance 02/05/2022   Benign essential tremor 02/05/2022   Chronic anticoagulation 02/05/2022   Atrial fibrillation ([place]) 02/08/2019   Lumbar stenosis with neurogenic claudication 07/13/2016   Annual physical exam>>>>>>>>>>>>>>>>>> 05/04/2016   PCP NOTES>>>>>>>>>>>>>>>>>>>>>>>>>>> 12/31/2015   Edema 02/19/2015   OA (osteoarthritis) of knee 06/25/2014   Unspecified sleep apnea 06/18/2014   Multiple thyroid nodules    Tremor    Carotid artery disease ([facility])    Erectile dysfunction 05/31/2012   Coronary artery disease    Venous  insufficiency    Hyperglycemia 11/09/2008   MELANOMA, HX OF 11/09/2008   COLONIC POLYPS, HYPERPLASTIC 05/10/2008   Hypothyroidism 08/10/2007   HYPERLIPIDEMIA 08/10/2007   Essential hypertension 08/10/2007   DJD (degenerative joint disease) 08/08/2007   MURMUR 08/08/2007   BENIGN PROSTATIC HYPERTROPHY, HX OF 08/05/2007     THERAPY DIAG:  Other abnormalities of gait and mobility  Muscle weakness (generalized)   PCP: [name], MD   REFERRING PROVIDER: Newman Pies, MD   REFERRING DIAG: G91.1 (ICD-10-CM) - Obstructive hydrocephalus R26.9 (ICD-10-CM) - Gait disturbance   Rationale for Evaluation and Treatment: Rehabilitation   THERAPY DIAG:  Other  abnormalities of gait and mobility   Muscle weakness (generalized)   ONSET DATE: chronic - has done PT before multiple times for strength, balance etc   SUBJECTIVE:  I have been working on my walking at home.                                                                                                             SUBJECTIVE STATEMENT:   Eval: Pt is referred to OPPT for gait training following VP shunt placement 07/09/22 for communicating hydrocephalus. Has done PT multiple times before and wants to do it again for strength, walking and balance.  He uses a SPC in community and either furniture surfs or uses cane at home. He feels he can do some walking without it.   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  His wife reports he can't go shopping with her due to poor endurance.    PERTINENT HISTORY:  VP shunt placed 07/09/22 for communicating hydrocephalus PMH includes lumbar surgery, bil THA, Rt TKA, a-fib, CAD, coronary angioplasty with stent, compromised ejection fraction   PAIN:  PAIN:  Are you having pain? No       PRECAUTIONS: Other: heart history, VP shunt placed 06/2022 for communicating hydrocephalus   WEIGHT BEARING RESTRICTIONS: No   FALLS:  Has patient fallen in last 6 months? No   LIVING ENVIRONMENT: Lives with: lives with their spouse Lives in: House/apartment Stairs: Yes: Internal: 14 steps; can reach both and External: 4 steps; can reach both, bedroom on main level  Has following equipment at home: Single point cane - uses it in community and furniture surfs at home or uses cane   OCCUPATION: retired   PLOF: Independent with household mobility with device and Independent with community mobility with device   PATIENT GOALS: strength, balance and gait   NEXT MD VISIT:    OBJECTIVE:    DIAGNOSTIC FINDINGS:  N/a   PATIENT SURVEYS:      SCREENING FOR RED FLAGS: Bowel or bladder incontinence: Yes: leaking after urination and if gets too  full Spinal tumors: No Cauda equina syndrome: No Compression fracture: No Abdominal aneurysm: No   COGNITION: Overall cognitive status: Within functional limits for tasks assessed                          SENSATION: [facility]  MUSCLE LENGTH: Hamstrings: Right 45 deg; Left 45 deg     POSTURE:  10/13/22: improved postural awareness, needs intermittent cueing to stand tall  Evaluation: flexed trunk  and weight shift right   PALPATION: N/a   LUMBAR ROM:    AROM eval 10/13/22  Flexion Flexes knees with fingers to mid shin Flexes knees with fingers to mid shin  Extension NT 8  Right lateral flexion 5 8  Left lateral flexion 8 8  Right rotation 25% 50%  Left rotation 25% 50%   (Blank rows = not tested)   LOWER EXTREMITY ROM:    Hip ROM grossly limited in ER 50% bil   LOWER EXTREMITY MMT:   10/13/22: 4+/5 bil LE  Eval: Grossly 4/5 on Rt Poor glut med and quad activation in stance phase of gait   LUMBAR SPECIAL TESTS:      FUNCTIONAL TESTS:  10/08/22:  5x sit to stand no UE assist 14 sec TUG:16 sec with SPC  Eval: 5 times sit to stand: 17 sec mod/max use of hands on chair Timed up and go (TUG): 22 sec with SPC Berg Balance Scale: 39/52   GAIT: Distance walked: 3MWT Assistive device utilized: Single point cane Level of assistance: Modified independence Comments: with SPC, covers 340' with SOB at 1:30, needs seated break   TODAY'S TREATMENT:   10/20/22: 3MWT 350 feet with seated break at 2' for SOB NuStep L5 x10' PT present to monitor Outdoor ambulation with gait belt and SPC: level surface, curbs up/down without rail, ramp up/down Stagger stance with head and trunk twists Square stance with cross body punches with trunk twist  10/15/22 NuStep L5 x 10' PT present to monitor 2lb ankle weights 2x10: hip abd, high knee march, hip ext Seated chest and overhead press x 7 rounds Step over low disc and back x 5 each leg, close supervision and gait belt Fwd step with  ipsilateral reach alt x 10, contralateral reach x 10, close supervision and gait belt Stagger stance balance 2x10", with head turns and trunk twists intermittent UE support Sit to stand x 5 no UE assist 3lb LAQ 2x10 3'12" walk with [facility] covering 332' with seated break at 1:40   10/13/22 NuStep L5 x 6' PT present to review status, goals 3MWT with SPC: covered 21' with single seated break at 1:30, due to SOB BERG balance test (see below), with repeated practice of challenging items Stairs review 4x4 steps with Rt rail: Pt pulling on Rt rail with alt pattern, will be safer to do step to using LE>UE effort    PATIENT EDUCATION:  Education details: Access Code: [email or web address] Person educated: Patient and Spouse Education method: Explanation, Demonstration, and Handouts Education comprehension: verbalized understanding and returned demonstration   HOME EXERCISE PROGRAM: Access Code: [email or web address] URL: [email or web address] Date: 09/30/2022 Prepared by: Venetia Night Kasie Leccese  Exercises - Seated Long Arc Quad  - 1 x daily - 7 x weekly - 1 sets - 10 reps - 5 hold - Standing Hip Abduction with Counter Support  - 1 x daily - 7 x weekly - 1 sets - 10 reps - Standing Hip Extension with Counter Support  - 1 x daily - 7 x weekly - 1 sets - 10 reps - Heel Raises with Counter Support  - 1 x daily - 7 x weekly - 1 sets - 10 reps - Seated Hip Adduction Isometrics with Ball  - 1 x daily - 7 x weekly - 1 sets - 10 reps - Seated  March  - 1 x daily - 7 x weekly - 1 sets - 10 reps - Ankle Pumps in Elevation  - 1 x daily - 7 x weekly - 1 sets - 10 reps - Forward Step Up  - 3 x daily - 7 x weekly - 2 sets - 10 reps - Standing Forward Toe Taps on Box (BKA)  - 3 x daily - 7 x weekly - 2 sets - 10 reps - Sit to Stand Without Arm Support  - 3 x daily - 7 x weekly - 1 sets - 10 reps - Standing Row with Anchored Resistance  - 1 x daily - 7 x weekly - 2 sets - 10 reps - Shoulder extension with resistance - Neutral   - 1 x daily - 7 x weekly - 2 sets - 10 reps   ASSESSMENT:   CLINICAL IMPRESSION: Pt was able to walk x 2' today before need for seated break due to SOB.  Session focused on endurance and community ambulation with focus on level surface, curbs, and ramps with SPC.  Gait belt used for safety.  ERO next visit with extension anticipated to continue working on gait, balance, endurance and community obstacles.     OBJECTIVE IMPAIRMENTS: Abnormal gait, cardiopulmonary status limiting activity, decreased activity tolerance, decreased balance, decreased coordination, decreased endurance, decreased knowledge of condition, decreased mobility, difficulty walking, decreased ROM, decreased strength, decreased safety awareness, hypomobility, increased edema, impaired flexibility, improper body mechanics, and postural dysfunction.    ACTIVITY LIMITATIONS: carrying, lifting, bending, standing, squatting, stairs, transfers, dressing, and locomotion level   PARTICIPATION LIMITATIONS: meal prep, cleaning, laundry, shopping, community activity, and yard work   PERSONAL FACTORS: Age, Time since onset of injury/illness/exacerbation, and 3+ comorbidities: + cardiac history, hydrocephalus with shunt, LE edema, Rt LE weakness with Hx of THA and TKA  are also affecting patient's functional outcome.    REHAB POTENTIAL: Good   CLINICAL DECISION MAKING: Stable/uncomplicated   EVALUATION COMPLEXITY: Low     GOALS: Goals reviewed with patient? Yes   SHORT TERM GOALS: Target date: 09/24/22   Pt will be ind with initial HEP Baseline: Goal status: met   2.  Pt will improve 5X sit to stand to </= 15 sec with min use of UE Baseline: 17 sec with mod/max use of UE Goal status: met, 14 sec   3.  Pt will demo improved quad and lateral hip muscle activations on Rt with stance phase of gait. Baseline:  Goal status: met   4.  Improve TUG to 19 sec or less. Baseline: 22 sec Goal status: met, 16 sec      LONG TERM  GOALS: Target date: 10/22/22   Pt will be ind with advanced HEP to maintain gains from PT. Baseline:  Goal status: ongoing   2.  Pt will improve TUG to 17 sec or less to demo improved efficiency and safety with functional mobility. Baseline:  Goal status: met, 16 sec   3.  Improve 5X sit to stand to 13 sec or less with min or no use of UE to demo reduced fall risk Baseline: 17 sec Goal status: ongoing, 14 sec   4.  Pt will demo improved gait endurance to allow for a 3 min walk test. Baseline: covered 350' on 10/20/22 Goal status: needed break at 2:00 for SOB   5.  Pt will improve LE strength to at least 4+/5 on Rt for improved gait safety, transfers and stairs Baseline: 4/5 Goal status: INITIAL  6.  Improve BERG Balance Test to at least 44/52 to demo improved static and dynamic balance to reduce fall risk. Baseline: 39/52 Goal status: met on 10/13/22   PLAN:   PT FREQUENCY: 2x/week   PT DURATION: 8 weeks   PLANNED INTERVENTIONS: Therapeutic exercises, Therapeutic activity, Neuromuscular re-education, Balance training, Gait training, Patient/Family education, Self Care, Joint mobilization, Stair training, DME instructions, Electrical stimulation, Spinal mobilization, Cryotherapy, Moist heat, and Manual therapy.   PLAN FOR NEXT SESSION: repeat 3MWT, ERO with outdoor ambulation with cane  Alexiya Franqui, PT 10/20/22 1:20 PM

## 2022-10-22 ENCOUNTER — Encounter: Payer: Self-pay | Admitting: Physical Therapy

## 2022-10-22 ENCOUNTER — Ambulatory Visit: Payer: Medicare Other | Attending: Neurosurgery | Admitting: Physical Therapy

## 2022-10-22 DIAGNOSIS — R2689 Other abnormalities of gait and mobility: Secondary | ICD-10-CM | POA: Insufficient documentation

## 2022-10-22 DIAGNOSIS — M6281 Muscle weakness (generalized): Secondary | ICD-10-CM | POA: Diagnosis not present

## 2022-10-22 NOTE — Therapy (Signed)
OUTPATIENT PHYSICAL THERAPY TREATMENT NOTE    Patient Name: [name] MRN: [id number] DOB:August 18, 1936, 87 y.o., male Today's Date: 10/22/2022  END OF SESSION:   PT End of Session - 10/22/22 1404     Visit Number 13    Date for PT Re-Evaluation 12/17/22    Authorization Type [employer] Medicare    Progress Note Due on Visit 44    PT Start Time 1401    PT Stop Time 1445    PT Time Calculation (min) 44 min    Equipment Utilized During Treatment Gait belt    Activity Tolerance Patient tolerated treatment well    Behavior During Therapy WFL for tasks assessed/performed                       Past Medical History:  Diagnosis Date   A-fib ([place])    Arthritis    BPH (benign prostatic hypertrophy)    Carotid artery disease ([place])    Doppler, December 08, 2011, 00 15% R. ICA, 52-08% LICA, followup 1 year   Coronary artery disease    DES Circumflex or CVA 2006  /  clear, February, 2011, EF 70%, no ischemia or   Ejection fraction    EF 60%, echo, 2009, mildly calcified aortic leaflets   History of colonic polyps    Hyperlipidemia    Hypertension    Hypothyroidism    Lumbar radiculopathy    Multiple thyroid nodules    Avascular echogenic areas noted in the right thyroid at the time of carotid Doppler.    PONV (postoperative nausea and vomiting)    "nausea with first hip surgery"   Skin cancer (melanoma) ([place])    PMH of    Spinal stenosis, lumbar    Tremor    Fine tremor right upper extremity   Venous insufficiency    Past Surgical History:  Procedure Laterality Date   COLONOSCOPY     CORONARY ANGIOPLASTY WITH STENT PLACEMENT  2006   x 2 stents; DES to CX and dRCA '06   KNEE SURGERY  1970's   "chipped bone"   LUMBAR LAMINECTOMY/DECOMPRESSION MICRODISCECTOMY Left 07/13/2016   Procedure: LUMBAR LAMINECTOMY AND FORAMINOTOMY Lumbar two, three, Lumbar three-four, Lumbar four-five, LEFT Lumbar five-Sacral one DISECTOMY;  Surgeon: Newman Pies, MD;  Location: [facility];   Service: Neurosurgery;  Laterality: Left;   TOE SURGERY  1997   TONSILLECTOMY     TOTAL HIP ARTHROPLASTY Left 2007   TOTAL HIP ARTHROPLASTY Right 2010   TOTAL KNEE ARTHROPLASTY Right 06/25/2014   Procedure: RIGHT TOTAL KNEE ARTHROPLASTY;  Surgeon: Gearlean Alf, MD;  Location: [facility];  Service: Orthopedics;  Laterality: Right;   VENTRICULOPERITONEAL SHUNT Right 07/09/2022   Procedure: SHUNT INSERTION VENTRICULAR-PERITONEAL;  Surgeon: Newman Pies, MD;  Location: [facility];  Service: Neurosurgery;  Laterality: Right;   Patient Active Problem List   Diagnosis Date Noted   (Idiopathic) normal pressure hydrocephalus ([place]) 07/09/2022   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[place]) 02/05/2022   Gait disturbance 02/05/2022   Benign essential tremor 02/05/2022   Chronic anticoagulation 02/05/2022   Atrial fibrillation ([place]) 02/08/2019   Lumbar stenosis with neurogenic claudication 07/13/2016   Annual physical exam>>>>>>>>>>>>>>>>>> 05/04/2016   PCP NOTES>>>>>>>>>>>>>>>>>>>>>>>>>>> 12/31/2015   Edema 02/19/2015   OA (osteoarthritis) of knee 06/25/2014   Unspecified sleep apnea 06/18/2014   Multiple thyroid nodules    Tremor    Carotid artery disease ([facility])    Erectile dysfunction 05/31/2012   Coronary artery disease    Venous  insufficiency    Hyperglycemia 11/09/2008   MELANOMA, HX OF 11/09/2008   COLONIC POLYPS, HYPERPLASTIC 05/10/2008   Hypothyroidism 08/10/2007   HYPERLIPIDEMIA 08/10/2007   Essential hypertension 08/10/2007   DJD (degenerative joint disease) 08/08/2007   MURMUR 08/08/2007   BENIGN PROSTATIC HYPERTROPHY, HX OF 08/05/2007     THERAPY DIAG:  Other abnormalities of gait and mobility  Muscle weakness (generalized)   PCP: [name], MD   REFERRING PROVIDER: Newman Pies, MD   REFERRING DIAG: G91.1 (ICD-10-CM) - Obstructive hydrocephalus R26.9 (ICD-10-CM) - Gait disturbance   Rationale for Evaluation and Treatment: Rehabilitation   THERAPY DIAG:  Other  abnormalities of gait and mobility   Muscle weakness (generalized)   ONSET DATE: chronic - has done PT before multiple times for strength, balance etc   SUBJECTIVE:  I have improved a lot.  Still limited on how long I can walk without getting out of breath.  My balance is getting much better and I feel stronger.  Pt's wife reports Pt has trouble getting in/out of car and they'd like to work on that.                                                                                                           SUBJECTIVE STATEMENT:   Eval: Pt is referred to OPPT for gait training following VP shunt placement 07/09/22 for communicating hydrocephalus. Has done PT multiple times before and wants to do it again for strength, walking and balance.  He uses a SPC in community and either furniture surfs or uses cane at home. He feels he can do some walking without it.   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  His wife reports he can't go shopping with her due to poor endurance.    PERTINENT HISTORY:  VP shunt placed 07/09/22 for communicating hydrocephalus PMH includes lumbar surgery, bil THA, Rt TKA, a-fib, CAD, coronary angioplasty with stent, compromised ejection fraction   PAIN:  PAIN:  Are you having pain? No       PRECAUTIONS: Other: heart history, VP shunt placed 06/2022 for communicating hydrocephalus   WEIGHT BEARING RESTRICTIONS: No   FALLS:  Has patient fallen in last 6 months? No   LIVING ENVIRONMENT: Lives with: lives with their spouse Lives in: House/apartment Stairs: Yes: Internal: 14 steps; can reach both and External: 4 steps; can reach both, bedroom on main level  Has following equipment at home: Single point cane - uses it in community and furniture surfs at home or uses cane   OCCUPATION: retired   PLOF: Independent with household mobility with device and Independent with community mobility with device   PATIENT GOALS: strength, balance and gait    NEXT MD VISIT:    OBJECTIVE:    DIAGNOSTIC FINDINGS:  N/a   PATIENT SURVEYS:      SCREENING FOR RED FLAGS: Bowel or bladder incontinence: Yes: leaking after urination and if gets too full Spinal tumors: No Cauda equina syndrome: No Compression fracture: No Abdominal aneurysm: No   COGNITION: Overall  cognitive status: Within functional limits for tasks assessed                          SENSATION: WFL   MUSCLE LENGTH: Hamstrings: Right 45 deg; Left 45 deg     POSTURE:  10/13/22: improved postural awareness, needs intermittent cueing to stand tall  Evaluation: flexed trunk  and weight shift right   PALPATION: N/a   LUMBAR ROM:    AROM eval 10/13/22 10/22/22  Flexion Flexes knees with fingers to mid shin Flexes knees with fingers to mid shin Flexes knees with fingers to mid shin  Extension NT 8 8  Right lateral flexion '5 8 8  '$ Left lateral flexion '8 8 8  '$ Right rotation 25% 50% 50%  Left rotation 25% 50% 50%   (Blank rows = not tested)   LOWER EXTREMITY ROM:    Hip ROM grossly limited in ER 50% bil   LOWER EXTREMITY MMT:   10/13/22: 4+/5 bil LE  Eval: Grossly 4/5 on Rt Poor glut med and quad activation in stance phase of gait   LUMBAR SPECIAL TESTS:      FUNCTIONAL TESTS:  10/22/22: 5 times sit to stand: 14 sec no UE Timed up and go (TUG): 17 sec with SPC, 16 sec with SPC Berg Balance Scale: 50/56   [facility] PT Assessment - 10/22/22 0001       Standardized Balance Assessment   Standardized Balance Assessment Berg Balance Test      Berg Balance Test   Sit to Stand Able to stand without using hands and stabilize independently    Standing Unsupported Able to stand safely 2 minutes    Sitting with Back Unsupported but Feet Supported on Floor or Stool Able to sit safely and securely 2 minutes    Stand to Sit Sits safely with minimal use of hands    Transfers Able to transfer safely, minor use of hands    Standing Unsupported with Eyes Closed Able to stand 10  seconds safely    Standing Unsupported with Feet Together Able to place feet together independently and stand 1 minute safely    From Standing, Reach Forward with Outstretched Arm Can reach confidently >25 cm (10")    From Standing Position, Pick up Object from Floor Able to pick up shoe, needs supervision    From Standing Position, Turn to Look Behind Over each Shoulder Looks behind one side only/other side shows less weight shift    Turn 360 Degrees Able to turn 360 degrees safely in 4 seconds or less    Standing Unsupported, Alternately Place Feet on Step/Stool Able to stand independently and safely and complete 8 steps in 20 seconds    Standing Unsupported, One Foot in Front Able to plae foot ahead of the other independently and hold 30 seconds    Standing on One Leg Tries to lift leg/unable to hold 3 seconds but remains standing independently    Total Score 50    Berg comment: 50/56             10/08/22:  5x sit to stand no UE assist 14 sec TUG:16 sec with SPC  Eval: 5 times sit to stand: 17 sec mod/max use of hands on chair Timed up and go (TUG): 22 sec with SPC Berg Balance Scale: 39/56   GAIT: Distance walked: 3MWT Assistive device utilized: Single point cane Level of assistance: Modified independence Comments: with SPC, covers 340' with SOB at  1:30, needs seated break   TODAY'S TREATMENT:  10/22/22: NuStep L6 x 5' 5x STS and TUG test (see above) BERG balance (see above) Standing blue bil row x 20 and bil shoulder ext x 20 3MWT covering 334' needing break at 1:45" due to SOB Getting in/out of car - backseat and front passenger seat  10/20/22: 3MWT 350 feet with seated break at 2' for SOB NuStep L5 x10' PT present to monitor Outdoor ambulation with gait belt and SPC: level surface, curbs up/down without rail, ramp up/down Stagger stance with head and trunk twists Square stance with cross body punches with trunk twist  10/15/22 NuStep L5 x 10' PT present to  monitor 2lb ankle weights 2x10: hip abd, high knee march, hip ext Seated chest and overhead press x 7 rounds Step over low disc and back x 5 each leg, close supervision and gait belt Fwd step with ipsilateral reach alt x 10, contralateral reach x 10, close supervision and gait belt Stagger stance balance 2x10", with head turns and trunk twists intermittent UE support Sit to stand x 5 no UE assist 3lb LAQ 2x10 3'12" walk with [facility] covering 332' with seated break at 1:40   10/13/22 NuStep L5 x 6' PT present to review status, goals 3MWT with SPC: covered 44' with single seated break at 1:30, due to SOB BERG balance test (see below), with repeated practice of challenging items Stairs review 4x4 steps with Rt rail: Pt pulling on Rt rail with alt pattern, will be safer to do step to using LE>UE effort    PATIENT EDUCATION:  Education details: Access Code: [email or web address] Person educated: Patient and Spouse Education method: Explanation, Demonstration, and Handouts Education comprehension: verbalized understanding and returned demonstration   HOME EXERCISE PROGRAM: Access Code: [email or web address] URL: [email or web address] Date: 09/30/2022 Prepared by: Venetia Night Benjaman Artman  Exercises - Seated Long Arc Quad  - 1 x daily - 7 x weekly - 1 sets - 10 reps - 5 hold - Standing Hip Abduction with Counter Support  - 1 x daily - 7 x weekly - 1 sets - 10 reps - Standing Hip Extension with Counter Support  - 1 x daily - 7 x weekly - 1 sets - 10 reps - Heel Raises with Counter Support  - 1 x daily - 7 x weekly - 1 sets - 10 reps - Seated Hip Adduction Isometrics with Ball  - 1 x daily - 7 x weekly - 1 sets - 10 reps - Seated March  - 1 x daily - 7 x weekly - 1 sets - 10 reps - Ankle Pumps in Elevation  - 1 x daily - 7 x weekly - 1 sets - 10 reps - Forward Step Up  - 3 x daily - 7 x weekly - 2 sets - 10 reps - Standing Forward Toe Taps on Box (BKA)  - 3 x daily - 7 x weekly - 2 sets - 10 reps - Sit to  Stand Without Arm Support  - 3 x daily - 7 x weekly - 1 sets - 10 reps - Standing Row with Anchored Resistance  - 1 x daily - 7 x weekly - 2 sets - 10 reps - Shoulder extension with resistance - Neutral  - 1 x daily - 7 x weekly - 2 sets - 10 reps   ASSESSMENT:   CLINICAL IMPRESSION: Pt has made signif improvements in functional mobility and fall risk indicators including improved scores on TUG, 5x sit to stand and BERG  balance test.  He continues to be very limited in cardiovascular endurance for gait, limited to 1:30 - 2:00 min.  He displays increased gait speed and shuffling as he grows fatigued so PT cueing him on listening to foot cadence and trying to focus on consistent and safe gait pattern.  PT recently added outdoor ambulation and car transfers to [place] for improved community access and outings if his endurance improves enough.  PT providing various levels of supervision and gait belt as needed for safety with higher level challenges.  Pt will continue to benefit from skilled PT to maximize safety at home and in community and improve functional endurance for daily activities.     OBJECTIVE IMPAIRMENTS: Abnormal gait, cardiopulmonary status limiting activity, decreased activity tolerance, decreased balance, decreased coordination, decreased endurance, decreased knowledge of condition, decreased mobility, difficulty walking, decreased ROM, decreased strength, decreased safety awareness, hypomobility, increased edema, impaired flexibility, improper body mechanics, and postural dysfunction.    ACTIVITY LIMITATIONS: carrying, lifting, bending, standing, squatting, stairs, transfers, dressing, and locomotion level   PARTICIPATION LIMITATIONS: meal prep, cleaning, laundry, shopping, community activity, and yard work   PERSONAL FACTORS: Age, Time since onset of injury/illness/exacerbation, and 3+ comorbidities: + cardiac history, hydrocephalus with shunt, LE edema, Rt LE weakness with Hx of THA and  TKA  are also affecting patient's functional outcome.    REHAB POTENTIAL: Good   CLINICAL DECISION MAKING: Stable/uncomplicated   EVALUATION COMPLEXITY: Low     GOALS: Goals reviewed with patient? Yes   SHORT TERM GOALS: Target date: 09/24/22   Pt will be ind with initial HEP Baseline: Goal status: met   2.  Pt will improve 5X sit to stand to </= 15 sec with min use of UE Baseline: 17 sec with mod/max use of UE Goal status: met, 14 sec   3.  Pt will demo improved quad and lateral hip muscle activations on Rt with stance phase of gait. Baseline:  Goal status: met   4.  Improve TUG to 19 sec or less. Baseline: 22 sec Goal status: met, 16 sec      LONG TERM GOALS: Target date: 10/22/22   Pt will be ind with advanced HEP to maintain gains from PT. Baseline:  Goal status: ongoing   2.  Pt will improve TUG to 17 sec or less to demo improved efficiency and safety with functional mobility. Baseline:  Goal status: met, 16 sec   3.  Improve 5X sit to stand to 13 sec or less with min or no use of UE to demo reduced fall risk Baseline: 17 sec Goal status: ongoing, 14 sec   4.  Pt will demo improved gait endurance to allow for a 3 min walk test. Baseline: covered 350' on 10/20/22 Goal status: needed break at 2:00 for SOB   5.  Pt will improve LE strength to at least 4+/5 on Rt for improved gait safety, transfers and stairs Baseline: 4/5 Goal status: INITIAL   6.  Improve BERG Balance Test to at least 44/52 to demo improved static and dynamic balance to reduce fall risk. Baseline: 39/52 Goal status: met on 10/13/22   PLAN:   PT FREQUENCY: 2x/week   PT DURATION: 8 weeks   PLANNED INTERVENTIONS: Therapeutic exercises, Therapeutic activity, Neuromuscular re-education, Balance training, Gait training, Patient/Family education, Self Care, Joint mobilization, Stair training, DME instructions, Electrical stimulation, Spinal mobilization, Cryotherapy, Moist heat, and Manual  therapy.   PLAN FOR NEXT SESSION: repeat 3MWT, ERO with outdoor ambulation with cane,  car transfers, continue balance, functional strength , postural strength, add hip mobility and LE stretching focus for car transfers  Baruch Merl, PT 10/22/22 3:44 PM

## 2022-10-26 DIAGNOSIS — C44729 Squamous cell carcinoma of skin of left lower limb, including hip: Secondary | ICD-10-CM | POA: Diagnosis not present

## 2022-10-28 ENCOUNTER — Other Ambulatory Visit: Payer: Self-pay | Admitting: Internal Medicine

## 2022-10-30 ENCOUNTER — Ambulatory Visit (HOSPITAL_BASED_OUTPATIENT_CLINIC_OR_DEPARTMENT_OTHER): Payer: Medicare Other | Admitting: General Surgery

## 2022-11-04 ENCOUNTER — Ambulatory Visit: Payer: Medicare Other | Admitting: Physical Therapy

## 2022-11-06 ENCOUNTER — Encounter (HOSPITAL_BASED_OUTPATIENT_CLINIC_OR_DEPARTMENT_OTHER): Payer: Medicare Other | Attending: General Surgery | Admitting: General Surgery

## 2022-11-06 DIAGNOSIS — Z7901 Long term (current) use of anticoagulants: Secondary | ICD-10-CM | POA: Diagnosis not present

## 2022-11-06 DIAGNOSIS — L97821 Non-pressure chronic ulcer of other part of left lower leg limited to breakdown of skin: Secondary | ICD-10-CM | POA: Diagnosis not present

## 2022-11-06 DIAGNOSIS — I2510 Atherosclerotic heart disease of native coronary artery without angina pectoris: Secondary | ICD-10-CM | POA: Diagnosis not present

## 2022-11-06 DIAGNOSIS — I480 Paroxysmal atrial fibrillation: Secondary | ICD-10-CM | POA: Insufficient documentation

## 2022-11-06 DIAGNOSIS — L988 Other specified disorders of the skin and subcutaneous tissue: Secondary | ICD-10-CM | POA: Diagnosis not present

## 2022-11-06 DIAGNOSIS — I872 Venous insufficiency (chronic) (peripheral): Secondary | ICD-10-CM | POA: Diagnosis not present

## 2022-11-06 DIAGNOSIS — I10 Essential (primary) hypertension: Secondary | ICD-10-CM | POA: Insufficient documentation

## 2022-11-06 DIAGNOSIS — L97222 Non-pressure chronic ulcer of left calf with fat layer exposed: Secondary | ICD-10-CM | POA: Insufficient documentation

## 2022-11-06 DIAGNOSIS — S81802A Unspecified open wound, left lower leg, initial encounter: Secondary | ICD-10-CM | POA: Diagnosis not present

## 2022-11-06 DIAGNOSIS — E785 Hyperlipidemia, unspecified: Secondary | ICD-10-CM | POA: Diagnosis not present

## 2022-11-07 NOTE — Progress Notes (Signed)
JULIANI, VANENGEN E ([id number]) [id number] Page 1 of 9 Visit Report for 11/06/2022 Arrival Information Details Patient Name: Date of Service: [name], [name] 11/06/2022 1:15 PM Medical Record Number: [id number] Patient Account Number: [id number] Date of Birth/Sex: Treating RN: Dec 30, 1935 (87 y.o. [name] Primary Care Pierce Barocio: [name] Other Clinician: Referring Estrella Alcaraz: Treating Brenee Gajda/Extender: [name] in Treatment: 12 Visit Information History Since Last Visit Added or deleted any medications: No Patient Arrived: [name] Any new allergies or adverse reactions: No Arrival Time: 13:14 Had a fall or experienced change in No Accompanied By: wife activities of daily living that may affect Transfer Assistance: None risk of falls: Patient Identification Verified: Yes Signs or symptoms of abuse/neglect since last visito No Secondary Verification Process Completed: Yes Hospitalized since last visit: No Implantable device outside of the clinic excluding No cellular tissue based products placed in the center since last visit: Has Dressing in Place as Prescribed: Yes Has Compression in Place as Prescribed: Yes Pain Present Now: No Electronic Signature(s) Signed: 11/06/2022 4:31:34 PM By: [name] Entered By: [name] on 11/06/2022 13:17:13 -------------------------------------------------------------------------------- Compression Therapy Details Patient Name: Date of Service: [name], [name] 11/06/2022 1:15 PM Medical Record Number: [id number] Patient Account Number: [id number] Date of Birth/Sex: Treating RN: Mar 21, 1936 (87 y.o. [name] Primary Care Raveena Hebdon: [name] Other Clinician: Referring Lattie Cervi: Treating Lander Eslick/Extender: [name] in Treatment: 12 Compression Therapy Performed for Wound Assessment: Wound #3 Left,Posterior Lower Leg Performed By:  Clinician [name], RN Compression Type: Three Layer Post Procedure Diagnosis Same as Pre-procedure Electronic Signature(s) Signed: 11/06/2022 4:31:34 PM By: [name] Entered By: [name] on 11/06/2022 14:05:32 [place], Wallace Keller ([id number] Page 2 of 9 -------------------------------------------------------------------------------- Encounter Discharge Information Details Patient Name: Date of Service: [name], [name] 11/06/2022 1:15 PM Medical Record Number: [id number] Patient Account Number: [id number] Date of Birth/Sex: Treating RN: 1936/07/06 (87 y.o. [name] Primary Care Soloman Mckeithan: [name] Other Clinician: Referring Chizara Mena: Treating Dariah Mcsorley/Extender: [name] in Treatment: 12 Encounter Discharge Information Items Post Procedure Vitals Discharge Condition: Stable Temperature (F): 97.6 Ambulatory Status: Cane Pulse (bpm): 76 Discharge Destination: Home Respiratory Rate (breaths/min): 16 Transportation: Private Auto Blood Pressure (mmHg): 123/63 Accompanied By: wife Schedule Follow-up Appointment: Yes Clinical Summary of Care: Patient Declined Electronic Signature(s) Signed: 11/06/2022 4:31:34 PM By: [name] Entered By: [name] on 11/06/2022 14:06:11 -------------------------------------------------------------------------------- Lower Extremity Assessment Details Patient Name: Date of Service: [name], [name] 11/06/2022 1:15 PM Medical Record Number: [id number] Patient Account Number: [id number] Date of Birth/Sex: Treating RN: 1936/09/20 (87 y.o. [name] Primary Care Cordell Guercio: [name] Other Clinician: Referring Kein Carlberg: Treating Zelta Enfield/Extender: [name] in Treatment: 12 Edema Assessment Assessed: [Left: No] [Right: No] [Left: Edema] [Right: :] Calf Left: Right: Point of Measurement: From Medial  Instep 44.9 cm Ankle Left: Right: Point of Measurement: From Medial Instep 22.5 cm Vascular Assessment Pulses: Dorsalis Pedis Palpable: [Left:Yes] Electronic Signature(s) Signed: 11/06/2022 4:31:34 PM By: [name] Entered By: [name] on 11/06/2022 13:24:49 Multi Wound Chart Details -------------------------------------------------------------------------------- [name] ([id number] Page 3 of 9 Patient Name: Date of Service: [name], [name] 11/06/2022 1:15 PM Medical Record Number: [id number] Patient Account Number: [id number] Date of Birth/Sex: Treating RN: 1936/04/22 (87 y.o. M) Primary Care Zoiee Wimmer: [name] Other Clinician: Referring Indira Sorenson: Treating Demarlo Riojas/Extender: [name] in Treatment: 12 Vital Signs Height(in): Pulse(bpm): 76 Weight(lbs):  Blood Pressure(mmHg): 123/63 Body Mass Index(BMI): Temperature(F): 97.6 Respiratory Rate(breaths/min): 16 Wound Assessments Wound Number: 3 4 N/A Photos: No Photos N/A Left, Posterior Lower Leg Left, Anterior Lower Leg N/A Wound Location: Puncture Gradually Appeared N/A Wounding Event: Lesion T be determined o N/A Primary Etiology: Glaucoma, Arrhythmia, Coronary Glaucoma, Arrhythmia, Coronary N/A Comorbid History: Artery Disease, Hypertension, Artery Disease, Hypertension, Peripheral Venous Disease, Peripheral Venous Disease, Osteoarthritis Osteoarthritis 08/12/2022 11/06/2022 N/A Date Acquired: 10 0 N/A Weeks of Treatment: Open Open N/A Wound Status: No No N/A Wound Recurrence: 2.5x1.9x0.2 3x3x0.1 N/A Measurements L x W x D (cm) 3.731 7.069 N/A A (cm) : rea 0.746 0.707 N/A Volume (cm) : -5154.90% N/A N/A % Reduction in A rea: -5228.60% N/A N/A % Reduction in Volume: Full Thickness Without Exposed Full Thickness Without Exposed N/A Classification: Support Structures Support Structures Medium Medium  N/A Exudate A mount: Serosanguineous Serosanguineous N/A Exudate Type: red, brown red, brown N/A Exudate Color: Distinct, outline attached Distinct, outline attached N/A Wound Margin: Medium (34-66%) Small (1-33%) N/A Granulation A mount: Red Red, Pink N/A Granulation Quality: Medium (34-66%) Large (67-100%) N/A Necrotic A mount: Eschar, Adherent Slough Eschar N/A Necrotic Tissue: Fat Layer (Subcutaneous Tissue): Yes Fat Layer (Subcutaneous Tissue): Yes N/A Exposed Structures: Fascia: No Fascia: No Tendon: No Tendon: No Muscle: No Muscle: No Joint: No Joint: No Bone: No Bone: No Small (1-33%) None N/A Epithelialization: Debridement - Excisional Debridement - Selective/Open Wound N/A Debridement: Pre-procedure Verification/Time Out 13:34 13:34 N/A Taken: Lidocaine 4% Topical Solution Lidocaine 4% Topical Solution N/A Pain Control: Necrotic/Eschar, Subcutaneous, Necrotic/Eschar N/A Tissue Debrided: Slough Skin/Subcutaneous Tissue Non-Viable Tissue N/A Level: 4.75 9 N/A Debridement A (sq cm): rea Curette Curette N/A Instrument: Minimum Minimum N/A Bleeding: Pressure Pressure N/A Hemostasis Achieved: Debridement Treatment Response: Procedure was tolerated well Procedure was tolerated well N/A Post Debridement Measurements L x 2.5x1.9x0.2 3x3x0.1 N/A W x D (cm) 0.746 0.707 N/A Post Debridement Volume: (cm) No Abnormalities Noted Scarring: Yes N/A Periwound Skin Texture: No Abnormalities Noted Dry/Scaly: Yes N/A Periwound Skin Moisture: Hemosiderin Staining: Yes N/A Periwound Skin Color: No Abnormality No Abnormality N/A Temperature: Debridement Debridement N/A Procedures Performed: Treatment Notes NISHANT, HOWORTH ([id number] Page 4 of 9 Wound #3 (Lower Leg) Wound Laterality: Left, Posterior Cleanser Soap and Water Discharge Instruction: May shower and wash wound with dial antibacterial soap and water prior to  dressing change. Wound Cleanser Discharge Instruction: Cleanse the wound with wound cleanser prior to applying a clean dressing using gauze sponges, not tissue or cotton balls. Peri-Wound Care Topical Mupirocin Ointment Discharge Instruction: Apply Mupirocin (Bactroban) as instructed Primary Dressing Secondary Dressing Zetuvit Plus Silicone Border Dressing 4x4 (in/in) Discharge Instruction: Apply silicone border over primary dressing as directed. Secured With Compression Wrap Compression Stockings Environmental education officer) Signed: 11/06/2022 1:57:06 PM By: Fredirick Maudlin MD FACS Entered By: Fredirick Maudlin on 11/06/2022 13:57:06 -------------------------------------------------------------------------------- Multi-Disciplinary Care Plan Details Patient Name: Date of Service: [name], [name] 11/06/2022 1:15 PM Medical Record Number: [id number] Patient Account Number: [id number] Date of Birth/Sex: Treating RN: 07/15/1936 (87 y.o. [name] Primary Care Noora Locascio: [name] Other Clinician: Referring Mardi Cannady: Treating Dashun Borre/Extender: [name] in Treatment: 12 Active Inactive Necrotic Tissue Nursing Diagnoses: Impaired tissue integrity related to necrotic/devitalized tissue Knowledge deficit related to management of necrotic/devitalized tissue Goals: Necrotic/devitalized tissue will be minimized in the wound bed Date Initiated: 08/25/2022 Target Resolution Date: 12/18/2022 Goal Status: Active Patient/caregiver will verbalize understanding of reason and process for debridement of necrotic tissue Date  Initiated: 08/25/2022 Target Resolution Date: 12/18/2022 Goal Status: Active Interventions: Assess patient pain level pre-, during and post procedure and prior to discharge Provide education on necrotic tissue and debridement process Treatment Activities: Apply topical anesthetic as ordered : 08/25/2022 Notes: ASHBY, KAPALA  ([id number]) [id number] Page 5 of 9 Electronic Signature(s) Signed: 11/06/2022 4:31:34 PM By: [name] By: [name] on 11/06/2022 13:35:43 -------------------------------------------------------------------------------- Pain Assessment Details Patient Name: Date of Service: [name], [name] 11/06/2022 1:15 PM Medical Record Number: [id number] Patient Account Number: [id number] Date of Birth/Sex: Treating RN: Jun 16, 1936 (87 y.o. [name] Primary Care Oseph Imburgia: [name] Other Clinician: Referring Beldon Nowling: Treating Kushal Saunders/Extender: [name] in Treatment: 12 Active Problems Location of Pain Severity and Description of Pain Patient Has Paino No Site Locations Rate the pain. Current Pain Level: 0 Pain Management and Medication Current Pain Management: Electronic Signature(s) Signed: 11/06/2022 4:31:34 PM By: [name] Entered By: [name] on 11/06/2022 13:17:38 -------------------------------------------------------------------------------- Patient/Caregiver Education Details Patient Name: Date of Service: [name], [name] 2/16/2024andnbsp1:15 PM Medical Record Number: [id number] Patient Account Number: [id number] Date of Birth/Gender: Treating RN: 1935-12-10 (87 y.o. [name] Primary Care Physician: [name] Other Clinician: Referring Physician: Treating Physician/Extender: [name] in Treatment: 12 Education Assessment Education Provided To: TRAVEON, EADY ([id number]) [id number] Page 6 of 9 Patient Education Topics Provided Wound/Skin Impairment: Methods: Explain/Verbal Responses: Reinforcements needed, State content correctly Electronic Signature(s) Signed: 11/06/2022 4:31:34 PM By: [name] Entered By: [name] on 11/06/2022  13:35:55 -------------------------------------------------------------------------------- Wound Assessment Details Patient Name: Date of Service: [name], [name] 11/06/2022 1:15 PM Medical Record Number: [id number] Patient Account Number: [id number] Date of Birth/Sex: Treating RN: 10/31/1935 (87 y.o. [name] Primary Care Naylin Burkle: [name] Other Clinician: Referring Saber Dickerman: Treating Akeya Ryther/Extender: [name] in Treatment: 12 Wound Status Wound Number: 3 Primary Lesion Etiology: Wound Location: Left, Posterior Lower Leg Wound Open Wounding Event: Puncture Status: Date Acquired: 08/12/2022 Comorbid Glaucoma, Arrhythmia, Coronary Artery Disease, Hypertension, Weeks Of Treatment: 10 History: Peripheral Venous Disease, Osteoarthritis Clustered Wound: No Photos Wound Measurements Length: (cm) 2.5 Width: (cm) 1.9 Depth: (cm) 0.2 Area: (cm) 3.731 Volume: (cm) 0.746 % Reduction in Area: -5154.9% % Reduction in Volume: -5228.6% Epithelialization: Small (1-33%) Tunneling: No Undermining: No Wound Description Classification: Full Thickness Without Exposed Support Structures Wound Margin: Distinct, outline attached Exudate Amount: Medium Exudate Type: Serosanguineous Exudate Color: red, brown Foul Odor After Cleansing: No Slough/Fibrino Yes Wound Bed Granulation Amount: Medium (34-66%) Exposed Structure Granulation Quality: Red Fascia Exposed: No Necrotic Amount: Medium (34-66%) Fat Layer (Subcutaneous Tissue) Exposed: Yes Necrotic Quality: Eschar, Adherent Slough Tendon Exposed: No Muscle Exposed: No Joint Exposed: No Bone Exposed: No MINA, METCALF E ([id number] Page 7 of 9 Periwound Skin Texture Texture Color No Abnormalities Noted: Yes No Abnormalities Noted: No Hemosiderin Staining: Yes Moisture No Abnormalities Noted: Yes Temperature / Pain Temperature: No Abnormality Treatment  Notes Wound #3 (Lower Leg) Wound Laterality: Left, Posterior Cleanser Soap and Water Discharge Instruction: May shower and wash wound with dial antibacterial soap and water prior to dressing change. Wound Cleanser Discharge Instruction: Cleanse the wound with wound cleanser prior to applying a clean dressing using gauze sponges, not tissue or cotton balls. Peri-Wound Care Sween Lotion (Moisturizing lotion) Discharge Instruction: Apply moisturizing lotion as directed Topical Primary Dressing IODOFLEX 0.9% Cadexomer Iodine Pad 4x6 cm Discharge Instruction: Apply to wound bed as instructed Secondary Dressing ABD Pad, 5x9 Discharge  Instruction: Apply over primary dressing as directed. Woven Gauze Sponge, Non-Sterile 4x4 in Discharge Instruction: Apply over primary dressing as directed. Secured With Compression Wrap ThreePress (3 layer compression wrap) Discharge Instruction: Apply three layer compression as directed. Compression Stockings Add-Ons Electronic Signature(s) Signed: 11/06/2022 4:31:34 PM By: [name] Entered By: [name] on 11/06/2022 13:28:10 -------------------------------------------------------------------------------- Wound Assessment Details Patient Name: Date of Service: [name], [name] 11/06/2022 1:15 PM Medical Record Number: [id number] Patient Account Number: [id number] Date of Birth/Sex: Treating RN: September 29, 1935 (87 y.o. [name] Primary Care Donavon Kimrey: [name] Other Clinician: Referring Keyaan Lederman: Treating Ameia Morency/Extender: [name] in Treatment: 12 Wound Status Wound Number: 4 Primary T be determined o Etiology: Wound Location: Left, Anterior Lower Leg Wound Open Wounding Event: Gradually Appeared Status: Date Acquired: 11/06/2022 Comorbid Glaucoma, Arrhythmia, Coronary Artery Disease, Hypertension, Weeks Of Treatment: 0 History: Peripheral Venous Disease, Osteoarthritis Clustered Wound:  No Wound Measurements LE, CLOUGHERTY E ([id number]) Length: (cm) 3 Width: (cm) 3 Depth: (cm) 0.1 Area: (cm) 7.069 Volume: (cm) 0.707 [id number] Page 8 of 9 % Reduction in Area: % Reduction in Volume: Epithelialization: None Tunneling: No Undermining: No Wound Description Classification: Full Thickness Without Exposed Support Structures Wound Margin: Distinct, outline attached Exudate Amount: Medium Exudate Type: Serosanguineous Exudate Color: red, brown Foul Odor After Cleansing: No Slough/Fibrino No Wound Bed Granulation Amount: Small (1-33%) Exposed Structure Granulation Quality: Red, Pink Fascia Exposed: No Necrotic Amount: Large (67-100%) Fat Layer (Subcutaneous Tissue) Exposed: Yes Necrotic Quality: Eschar Tendon Exposed: No Muscle Exposed: No Joint Exposed: No Bone Exposed: No Periwound Skin Texture Texture Color No Abnormalities Noted: No No Abnormalities Noted: No Scarring: Yes Temperature / Pain Temperature: No Abnormality Moisture No Abnormalities Noted: No Dry / Scaly: Yes Treatment Notes Wound #4 (Lower Leg) Wound Laterality: Left, Anterior Cleanser Soap and Water Discharge Instruction: May shower and wash wound with dial antibacterial soap and water prior to dressing change. Wound Cleanser Discharge Instruction: Cleanse the wound with wound cleanser prior to applying a clean dressing using gauze sponges, not tissue or cotton balls. Peri-Wound Care Sween Lotion (Moisturizing lotion) Discharge Instruction: Apply moisturizing lotion as directed Topical Primary Dressing Sorbalgon AG Dressing 2x2 (in/in) Discharge Instruction: Apply to wound bed as instructed Secondary Dressing ABD Pad, 5x9 Discharge Instruction: Apply over primary dressing as directed. Woven Gauze Sponge, Non-Sterile 4x4 in Discharge Instruction: Apply over primary dressing as directed. Secured With Compression Wrap ThreePress (3 layer compression  wrap) Discharge Instruction: Apply three layer compression as directed. Compression Stockings Add-Ons Electronic Signature(s) Signed: 11/06/2022 4:31:34 PM By: [name] Entered By: [name] on 11/06/2022 13:45:06 Kerins, Wallace Keller ([id number] Page 9 of 9 -------------------------------------------------------------------------------- Vitals Details Patient Name: Date of Service: [name], [name] 11/06/2022 1:15 PM Medical Record Number: [id number] Patient Account Number: [id number] Date of Birth/Sex: Treating RN: 29-Mar-1936 (87 y.o. [name] Primary Care Ariyannah Pauling: [name] Other Clinician: Referring Wren Gallaga: Treating Satrina Magallanes/Extender: [name] in Treatment: 12 Vital Signs Time Taken: 13:17 Temperature (F): 97.6 Pulse (bpm): 76 Respiratory Rate (breaths/min): 16 Blood Pressure (mmHg): 123/63 Reference Range: 80 - 120 mg / dl Electronic Signature(s) Signed: 11/06/2022 4:31:34 PM By: [name] Entered By: [name] on 11/06/2022 13:17:31

## 2022-11-07 NOTE — Progress Notes (Signed)
[name] ([id number]) [id number] Page 1 of 12 Visit Report for 11/06/2022 Chief Complaint Document Details Patient Name: Date of Service: [name], [name] 11/06/2022 1:15 PM Medical Record Number: [id number] Patient Account Number: [id number] Date of Birth/Sex: Treating RN: 1936-07-06 (87 y.o. M) Primary Care Provider: [name] Other Clinician: Referring Provider: Treating Provider/Extender: [name] in Treatment: 12 Information Obtained from: Patient Chief Complaint Left lower extremity wound Electronic Signature(s) Signed: 11/06/2022 1:57:47 PM By: Fredirick Maudlin MD FACS Entered By: Fredirick Maudlin on 11/06/2022 13:57:47 -------------------------------------------------------------------------------- Debridement Details Patient Name: Date of Service: [name], [name] 11/06/2022 1:15 PM Medical Record Number: [id number] Patient Account Number: [id number] Date of Birth/Sex: Treating RN: [birth year] (87 y.o. [name] Primary Care Provider: [name] Other Clinician: Referring Provider: Treating Provider/Extender: [name] in Treatment: 12 Debridement Performed for Assessment: Wound #4 Left,Anterior Lower Leg Performed By: Physician Fredirick Maudlin, MD Debridement Type: Debridement Level of Consciousness (Pre-procedure): Awake and Alert Pre-procedure Verification/Time Out Yes - 13:34 Taken: Start Time: 13:34 Pain Control: Lidocaine 4% Topical Solution T Area Debrided (L x W): otal 3 (cm) x 3 (cm) = 9 (cm) Tissue and other material debrided: Non-Viable, Eschar Level: Non-Viable Tissue Debridement Description: Selective/Open Wound Instrument: Curette Bleeding: Minimum Hemostasis Achieved: Pressure Response to Treatment: Procedure was tolerated well Level of Consciousness (Post- Awake and Alert procedure): Post Debridement Measurements of Total Wound Length: (cm) 3 Width:  (cm) 3 Depth: (cm) 0.1 Volume: (cm) 0.707 Character of Wound/Ulcer Post Debridement: Improved Post Procedure Diagnosis Same as Pre-procedure SAFWAAN, [name] ([id number] Page 2 of 12 Notes scribed for Dr. Celine Ahr by [name], RN Electronic Signature(s) Signed: 11/06/2022 2:14:17 PM By: Fredirick Maudlin MD FACS Signed: 11/06/2022 4:31:34 PM By: [name] By: [name] on 11/06/2022 13:45:44 -------------------------------------------------------------------------------- Debridement Details Patient Name: Date of Service: [name], [name] 11/06/2022 1:15 PM Medical Record Number: [id number] Patient Account Number: [id number] Date of Birth/Sex: Treating RN: Mar 05, 1936 (87 y.o. [name] Primary Care Provider: [name] Other Clinician: Referring Provider: Treating Provider/Extender: [name] in Treatment: 12 Debridement Performed for Assessment: Wound #3 Left,Posterior Lower Leg Performed By: Physician Fredirick Maudlin, MD Debridement Type: Debridement Level of Consciousness (Pre-procedure): Awake and Alert Pre-procedure Verification/Time Out Yes - 13:34 Taken: Start Time: 13:34 Pain Control: Lidocaine 4% T opical Solution T Area Debrided (L x W): otal 2.5 (cm) x 1.9 (cm) = 4.75 (cm) Tissue and other material debrided: Non-Viable, Eschar, Slough, Subcutaneous, Slough Level: Skin/Subcutaneous Tissue Debridement Description: Excisional Instrument: Curette Bleeding: Minimum Hemostasis Achieved: Pressure Response to Treatment: Procedure was tolerated well Level of Consciousness (Post- Awake and Alert procedure): Post Debridement Measurements of Total Wound Length: (cm) 2.5 Width: (cm) 1.9 Depth: (cm) 0.2 Volume: (cm) 0.746 Character of Wound/Ulcer Post Debridement: Improved Post Procedure Diagnosis Same as Pre-procedure Notes scribed for Dr. Celine Ahr by [name], RN Electronic Signature(s) Signed: 11/06/2022 2:14:17 PM By: Fredirick Maudlin MD FACS Signed: 11/06/2022 4:31:34 PM By: [name] Entered By: [name] on 11/06/2022 13:46:01 -------------------------------------------------------------------------------- HPI Details Patient Name: Date of Service: [name], [name] 11/06/2022 1:15 PM Medical Record Number: [id number] Patient Account Number: [id number] Date of Birth/Sex: Treating RN: [birth year] (87 y.o. M) Primary Care Provider: [name] Other Clinician: DETAVION, [name] ([id number]) [id number] Page 3 of 12 Referring Provider: Treating Provider/Extender: [name] in Treatment: 12 History of Present Illness HPI Description: [name] is an 87 year old  male with a past medical history of CAD s/p DES to LCx 2006, carotid artery disease, hypertension, hyperlipidemia, paroxysmal atrial fibrillation on Eliquis and Cardizem that presents today for right lower extremity wound. He was seen by Dr. Larose Kells, his primary care provider on 4/29 for this issue and was referred to our clinic. Patient states that he had a small wound that spontaneously started in October 2021 and has not healed. He states this has progressively gotten larger. He has been using acne cream prescribed by the dermatologist for this issue. He reports drainage to the wound but no purulent drainage. He reports mild soreness to the wound. He has swelling in his right leg greater than left and reports this is a chronic issue for the past 1 to 2 years. He denies resting leg pain or pain with ambulation. Of note He was prescribed doxycycline at the end of last month and has finished this course for possible skin infection to the wound site. 02/06/2021; I am seeing this patient who was admitted to the clinic last week by Dr. Heber [place]. He has predominantly I think a venous insufficiency wound on the right medial  lower leg he has been using Santyl Hydrofera Blue under 3 layer compression. Arterial studies were ordered last week but do not seem to been put through. He is not a diabetic. He did have venous reflux studies done in August 2020. He did have abnormal reflux time was noted in the great saphenous vein in the distal thigh and the great saphenous vein at the knee. There was no evidence of DVT or SVT at the time he was not felt to have large enough saphenous veins on either side for intervention. Compression stockings at least knee-high were recommended. Follow-up was on a as needed basis with Dr. Donzetta Matters The patient does not describe claudication but his pulses in his feet are not vibrant this could be because of the swelling 5/26; patient presents for 1 week follow-up. He has been using Hydrofera Blue under 3 layer compression. He had arterial studies done. He has no issues or complaints today. He denies signs of infection. He has his juxta light compressions with him today. 6/3; patient presents for 1 week follow-up. He has been using Hydrofera Blue under 3 layer compression. He denies signs and symptoms of infection. He did have bright green drainage which he states he has not seen before when the dressing was taken off today. He is using his juxta light compression to the left leg. 6/10; patient presents for 1 week follow-up. He has been tolerating Hydrofera Blue with 3 layer compression. He again reports bright green drainage. However he denies any signs of infection. He has been using juxta light compression to the left leg. 6/24; patient presents for 2-week follow-up. He has been tolerating Hydrofera Blue with 3 layer compression. Today he is healed. He brought his juxta lite compression for the right leg and continues to wear the juxta light compression on the left leg READMISSION 05/05/2022 The patient returns to clinic today with a new wound on his left posterior leg. He has been wearing his  juxta lite stockings on a regular basis, but on exam he still has 3+ pitting edema. I suspect he has lymphedema in addition to his known venous reflux. The wounds are scattered geographic wounds with light slough accumulation. They have been applying mupirocin and CeraVe ointment. He does have a VP shunt placement scheduled for next week, but it sounds like his neurosurgeon is not  aware of the fact that he has an open wound. 05/11/2022: His VP shunt placement has been postponed while we get his wound to heal. The wounds are all smaller today with just a little eschar and minimal slough. Edema control is good. No concern for infection. 05/18/2022: All of the open areas have contracted and there is good perimeter epithelialization. There is a little bit of eschar and slough on the open areas. Good edema control. No concern for infection. 05/26/2022: Many of the small open areas have closed. There is Hydrofera Blue sponge stuck in a number of places and this is unable to be removed by the intake nurse. There is a fair amount of eschar and a little bit of slough present. 06/02/2022: Continued closure of small open skin sites. There are just a couple remaining and these have a little eschar on the surface. Edema control is excellent. 06/09/2022: We are down to just 1 small open area. There is some eschar and slough present. No concern for infection. Edema control is excellent. 06/16/2022: His wound is healed. READMISSION 08/11/2022 [name] underwent a successful VP shunt placement. He returns to clinic today because of a suspicious lesion on his left posterior calf. He actually has no open wounds and his edema control is excellent. He is wearing his juxta lite stockings religiously. On his left posterior calf, there is a raised scaly lesion concerning for potential squamous cell carcinoma. 08/25/2022: The biopsy that I took at his last visit was consistent with a well-differentiated squamous cell carcinoma.  He has an appointment at the skin surgery center on December 14. The wound from the biopsy has some slough accumulation, but no concern for infection. 11/06/2022: Since his last visit here, he has undergone Mohs surgery for the squamous cell carcinoma that I biopsied. He has a fairly substantial and deep defect. Apparently the procedure took place between 10 to 12 days ago and he has been in an [employer] ever since. When his Unna boot was removed, he was found to have crusting on his anterior tibial surface with small superficial wounds underneath. Electronic Signature(s) Signed: 11/06/2022 1:58:44 PM By: Fredirick Maudlin MD FACS Entered By: Fredirick Maudlin on 11/06/2022 13:58:44 Topel, Wallace Keller ([id number] Page 4 of 12 -------------------------------------------------------------------------------- Physical Exam Details Patient Name: Date of Service: [name], [name] 11/06/2022 1:15 PM Medical Record Number: [id number] Patient Account Number: [id number] Date of Birth/Sex: Treating RN: [birth year] (86 y.o. M) Primary Care Provider: [name] Other Clinician: Referring Provider: Treating Provider/Extender: [name] in Treatment: 12 Constitutional . . . . no acute distress. Respiratory Normal work of breathing on room air. Notes 11/06/2022: On his anterior tibial surface, under a thick layer of crust and eschar, there are multiple tiny superficial wounds. On his left calf, he has a Mohs defect. It is quite deep into the fat layer and there is a substantial amount of slough and nonviable subcutaneous tissue. Electronic Signature(s) Signed: 11/06/2022 2:01:01 PM By: Fredirick Maudlin MD FACS Entered By: Fredirick Maudlin on 11/06/2022 14:01:01 -------------------------------------------------------------------------------- Physician Orders Details Patient Name: Date of Service: [name], [name] 11/06/2022 1:15 PM Medical Record  Number: [id number] Patient Account Number: [id number] Date of Birth/Sex: Treating RN: March 20, 1936 (87 y.o. [name] Primary Care Provider: [name] Other Clinician: Referring Provider: Treating Provider/Extender: [name] in Treatment: 12 Verbal / Phone Orders: No Diagnosis Coding ICD-10 Coding Code Description [phone] Non-pressure chronic ulcer of left calf with fat  layer exposed L97.821 Non-pressure chronic ulcer of other part of left lower leg limited to breakdown of skin L98.9 Disorder of the skin and subcutaneous tissue, unspecified I87.2 Venous insufficiency (chronic) (peripheral) I10 Essential (primary) hypertension Follow-up Appointments ppointment in 1 week. - Dr. Celine Ahr - room 2 Return A Anesthetic (In clinic) Topical Lidocaine 4% applied to wound bed Bathing/ Shower/ Hygiene May shower and wash wound with soap and water. Edema Control - Lymphedema / SCD / Other Elevate legs to the level of the heart or above for 30 minutes daily and/or when sitting for 3-4 times a day throughout the day. Avoid standing for long periods of time. Exercise regularly - including ankle circles while sitting Moisturize legs daily. - both legs nightly after removing juxtalites Compression stocking or Garment 20-30 mm/Hg pressure to: - both legs daily Wound Treatment Wound #3 - Lower Leg Wound Laterality: Left, Posterior Cleanser: Soap and Water 1 x Per Day/30 Days Discharge Instructions: May shower and wash wound with dial antibacterial soap and water prior to dressing change. Cleanser: Wound Cleanser 1 x Per Day/30 Days CYLAR, LAPA ([id number]) [phone].pdf Page 5 of 12 Discharge Instructions: Cleanse the wound with wound cleanser prior to applying a clean dressing using gauze sponges, not tissue or cotton balls. Peri-Wound Care: Sween Lotion (Moisturizing lotion) 1 x Per Day/30 Days Discharge Instructions: Apply moisturizing  lotion as directed Prim Dressing: IODOFLEX 0.9% Cadexomer Iodine Pad 4x6 cm 1 x Per Day/30 Days ary Discharge Instructions: Apply to wound bed as instructed Secondary Dressing: ABD Pad, 5x9 1 x Per Day/30 Days Discharge Instructions: Apply over primary dressing as directed. Secondary Dressing: Woven Gauze Sponge, Non-Sterile 4x4 in 1 x Per Day/30 Days Discharge Instructions: Apply over primary dressing as directed. Compression Wrap: ThreePress (3 layer compression wrap) 1 x Per Day/30 Days Discharge Instructions: Apply three layer compression as directed. Wound #4 - Lower Leg Wound Laterality: Left, Anterior Cleanser: Soap and Water 1 x Per Day/30 Days Discharge Instructions: May shower and wash wound with dial antibacterial soap and water prior to dressing change. Cleanser: Wound Cleanser 1 x Per Day/30 Days Discharge Instructions: Cleanse the wound with wound cleanser prior to applying a clean dressing using gauze sponges, not tissue or cotton balls. Peri-Wound Care: Sween Lotion (Moisturizing lotion) 1 x Per Day/30 Days Discharge Instructions: Apply moisturizing lotion as directed Prim Dressing: Sorbalgon AG Dressing 2x2 (in/in) 1 x Per Day/30 Days ary Discharge Instructions: Apply to wound bed as instructed Secondary Dressing: ABD Pad, 5x9 1 x Per Day/30 Days Discharge Instructions: Apply over primary dressing as directed. Secondary Dressing: Woven Gauze Sponge, Non-Sterile 4x4 in 1 x Per Day/30 Days Discharge Instructions: Apply over primary dressing as directed. Compression Wrap: ThreePress (3 layer compression wrap) 1 x Per Day/30 Days Discharge Instructions: Apply three layer compression as directed. Patient Medications llergies: No Known Allergies A Notifications Medication Indication Start End 11/06/2022 lidocaine DOSE topical 4 % cream - cream topical Electronic Signature(s) Signed: 11/06/2022 2:14:17 PM By: Fredirick Maudlin MD FACS Signed: 11/06/2022 4:31:34 PM By:  [name] Entered By: [name] on 11/06/2022 14:05:18 -------------------------------------------------------------------------------- Problem List Details Patient Name: Date of Service: [name], [name] 11/06/2022 1:15 PM Medical Record Number: [id number] Patient Account Number: [id number] Date of Birth/Sex: Treating RN: Jul 15, 1936 (87 y.o. M) Primary Care Provider: [name] Other Clinician: Referring Provider: Treating Provider/Extender: [name] in Treatment: [address] Problems ICD-10 KIAI, PEACE ([id number]) [id number] Page 6 of 12 Encounter Code Description Active Date  MDM Diagnosis L97.222 Non-pressure chronic ulcer of left calf with fat layer exposed 11/06/2022 No Yes L97.821 Non-pressure chronic ulcer of other part of left lower leg limited to breakdown 11/06/2022 No Yes of skin L98.9 Disorder of the skin and subcutaneous tissue, unspecified 08/11/2022 No Yes I87.2 Venous insufficiency (chronic) (peripheral) 08/11/2022 No Yes I10 Essential (primary) hypertension 08/11/2022 No Yes Inactive Problems Resolved Problems Electronic Signature(s) Signed: 11/06/2022 1:56:54 PM By: Fredirick Maudlin MD FACS Entered By: Fredirick Maudlin on 11/06/2022 13:56:54 -------------------------------------------------------------------------------- Progress Note Details Patient Name: Date of Service: [name], [name] 11/06/2022 1:15 PM Medical Record Number: [id number] Patient Account Number: [id number] Date of Birth/Sex: Treating RN: 03-02-1936 (87 y.o. M) Primary Care Provider: [name] Other Clinician: Referring Provider: Treating Provider/Extender: [name] in Treatment: 12 Subjective Chief Complaint Information obtained from Patient Left lower extremity wound History of Present Illness (HPI) [name] is an 87 year old male with a past medical history of CAD s/p DES to LCx 2006,  carotid artery disease, hypertension, hyperlipidemia, paroxysmal atrial fibrillation on Eliquis and Cardizem that presents today for right lower extremity wound. He was seen by Dr. Larose Kells, his primary care provider on 4/29 for this issue and was referred to our clinic. Patient states that he had a small wound that spontaneously started in October 2021 and has not healed. He states this has progressively gotten larger. He has been using acne cream prescribed by the dermatologist for this issue. He reports drainage to the wound but no purulent drainage. He reports mild soreness to the wound. He has swelling in his right leg greater than left and reports this is a chronic issue for the past 1 to 2 years. He denies resting leg pain or pain with ambulation. Of note He was prescribed doxycycline at the end of last month and has finished this course for possible skin infection to the wound site. 02/06/2021; I am seeing this patient who was admitted to the clinic last week by Dr. Heber [place]. He has predominantly I think a venous insufficiency wound on the right medial lower leg he has been using Santyl Hydrofera Blue under 3 layer compression. Arterial studies were ordered last week but do not seem to been put through. He is not a diabetic. He did have venous reflux studies done in August 2020. He did have abnormal reflux time was noted in the great saphenous vein in the distal thigh and the great saphenous vein at the knee. There was no evidence of DVT or SVT at the time he was not felt to have large enough saphenous veins on either side for intervention. Compression stockings at least knee-high were recommended. Follow-up was on a as needed basis with Dr. Donzetta Matters The patient does not describe claudication but his pulses in his feet are not vibrant this could be because of the swelling 5/26; patient presents for 1 week follow-up. He has been using Hydrofera Blue under 3 layer compression. He had arterial studies done.  He has no issues or complaints today. He denies signs of infection. He has his juxta light compressions with him today. 6/3; patient presents for 1 week follow-up. He has been using Hydrofera Blue under 3 layer compression. He denies signs and symptoms of infection. He did have bright green drainage which he states he has not seen before when the dressing was taken off today. He is using his juxta light compression to the left CHEIKH, RONNINGEN [name] ([id number]) [id number] Page 7 of 12 leg. 6/10; patient presents  for 1 week follow-up. He has been tolerating Hydrofera Blue with 3 layer compression. He again reports bright green drainage. However he denies any signs of infection. He has been using juxta light compression to the left leg. 6/24; patient presents for 2-week follow-up. He has been tolerating Hydrofera Blue with 3 layer compression. Today he is healed. He brought his juxta lite compression for the right leg and continues to wear the juxta light compression on the left leg READMISSION 05/05/2022 The patient returns to clinic today with a new wound on his left posterior leg. He has been wearing his juxta lite stockings on a regular basis, but on exam he still has 3+ pitting edema. I suspect he has lymphedema in addition to his known venous reflux. The wounds are scattered geographic wounds with light slough accumulation. They have been applying mupirocin and CeraVe ointment. He does have a VP shunt placement scheduled for next week, but it sounds like his neurosurgeon is not aware of the fact that he has an open wound. 05/11/2022: His VP shunt placement has been postponed while we get his wound to heal. The wounds are all smaller today with just a little eschar and minimal slough. Edema control is good. No concern for infection. 05/18/2022: All of the open areas have contracted and there is good perimeter epithelialization. There is a little bit of eschar and slough on the  open areas. Good edema control. No concern for infection. 05/26/2022: Many of the small open areas have closed. There is Hydrofera Blue sponge stuck in a number of places and this is unable to be removed by the intake nurse. There is a fair amount of eschar and a little bit of slough present. 06/02/2022: Continued closure of small open skin sites. There are just a couple remaining and these have a little eschar on the surface. Edema control is excellent. 06/09/2022: We are down to just 1 small open area. There is some eschar and slough present. No concern for infection. Edema control is excellent. 06/16/2022: His wound is healed. READMISSION 08/11/2022 [name] underwent a successful VP shunt placement. He returns to clinic today because of a suspicious lesion on his left posterior calf. He actually has no open wounds and his edema control is excellent. He is wearing his juxta lite stockings religiously. On his left posterior calf, there is a raised scaly lesion concerning for potential squamous cell carcinoma. 08/25/2022: The biopsy that I took at his last visit was consistent with a well-differentiated squamous cell carcinoma. He has an appointment at the skin surgery center on December 14. The wound from the biopsy has some slough accumulation, but no concern for infection. 11/06/2022: Since his last visit here, he has undergone Mohs surgery for the squamous cell carcinoma that I biopsied. He has a fairly substantial and deep defect. Apparently the procedure took place between 10 to 12 days ago and he has been in an [employer] ever since. When his Unna boot was removed, he was found to have crusting on his anterior tibial surface with small superficial wounds underneath. Patient History Information obtained from Patient. Family History Cancer - Father, Diabetes - Maternal Grandparents, Heart Disease - Maternal Grandparents, Hypertension - Maternal Grandparents, No family history of Hereditary  Spherocytosis, Kidney Disease, Lung Disease, Seizures, Stroke, Thyroid Problems, Tuberculosis. Social History Former smoker - Quit over 30 years ago, Marital Status - Married, Alcohol Use - Rarely, Drug Use - No History, Caffeine Use - Daily. Medical History Eyes Patient has history  of Glaucoma Cardiovascular Patient has history of Arrhythmia - afib, Coronary Artery Disease, Hypertension, Peripheral Venous Disease Endocrine Denies history of Type I Diabetes, Type II Diabetes Genitourinary Denies history of End Stage Renal Disease Integumentary (Skin) Denies history of History of Burn Musculoskeletal Patient has history of Osteoarthritis Oncologic Denies history of Received Chemotherapy, Received Radiation Psychiatric Denies history of Anorexia/bulimia, Confinement Anxiety Hospitalization/Surgery History - lumbar laminectomy. - total knee arthropathy right. - bil total hip arthroplasty. - coronary stents. - toe surgery. - tonsillectomy. - shunt insertion. Medical A Surgical History Notes nd Gastrointestinal colon polyps Endocrine Hypothyroidism Genitourinary BPH Musculoskeletal Bilat Hip Replacements, Right Knee Replacement, lumbar stenosis Neurologic hydrocephalus Oncologic Skin Cancer multiple sites BRUK, CHOPIN ([id number]) [phone].pdf Page 8 of 12 Objective Constitutional no acute distress. Vitals Time Taken: 1:17 PM, Temperature: 97.6 F, Pulse: 76 bpm, Respiratory Rate: 16 breaths/min, Blood Pressure: 123/63 mmHg. Respiratory Normal work of breathing on room air. General Notes: 11/06/2022: On his anterior tibial surface, under a thick layer of crust and eschar, there are multiple tiny superficial wounds. On his left calf, he has a Mohs defect. It is quite deep into the fat layer and there is a substantial amount of slough and nonviable subcutaneous tissue. Integumentary (Hair, Skin) Wound #3 status is Open. Original cause of wound was  Puncture. The date acquired was: 08/12/2022. The wound has been in treatment 10 weeks. The wound is located on the Left,Posterior Lower Leg. The wound measures 2.5cm length x 1.9cm width x 0.2cm depth; 3.731cm^2 area and 0.746cm^3 volume. There is Fat Layer (Subcutaneous Tissue) exposed. There is no tunneling or undermining noted. There is a medium amount of serosanguineous drainage noted. The wound margin is distinct with the outline attached to the wound base. There is medium (34-66%) red granulation within the wound bed. There is a medium (34-66%) amount of necrotic tissue within the wound bed including Eschar and Adherent Slough. The periwound skin appearance had no abnormalities noted for texture. The periwound skin appearance had no abnormalities noted for moisture. The periwound skin appearance exhibited: Hemosiderin Staining. Periwound temperature was noted as No Abnormality. Wound #4 status is Open. Original cause of wound was Gradually Appeared. The date acquired was: 11/06/2022. The wound is located on the Left,Anterior Lower Leg. The wound measures 3cm length x 3cm width x 0.1cm depth; 7.069cm^2 area and 0.707cm^3 volume. There is Fat Layer (Subcutaneous Tissue) exposed. There is no tunneling or undermining noted. There is a medium amount of serosanguineous drainage noted. The wound margin is distinct with the outline attached to the wound base. There is small (1-33%) red, pink granulation within the wound bed. There is a large (67-100%) amount of necrotic tissue within the wound bed including Eschar. The periwound skin appearance exhibited: Scarring, Dry/Scaly. Periwound temperature was noted as No Abnormality. 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 There was a Excisional Skin/Subcutaneous Tissue Debridement with a total area of 4.75 sq cm performed by Fredirick Maudlin, MD. With the following instrument(s): Curette to remove Non-Viable tissue/material. Material removed includes Eschar, Subcutaneous Tissue, and Slough after achieving pain control using Lidocaine 4% T opical Solution. No specimens were taken. A time out was conducted at  13:34, prior to the start of the procedure. A Minimum amount of bleeding was controlled with Pressure. The procedure was tolerated well. Post Debridement Measurements: 2.5cm length x 1.9cm width x 0.2cm depth; 0.746cm^3 volume. Character of Wound/Ulcer Post Debridement is improved. Post procedure Diagnosis Wound #3: Same as Pre-Procedure General Notes: scribed for Dr. Celine Ahr by [name], RN. Pre-procedure diagnosis of Wound #3 is a Lesion located on the Left,Posterior Lower Leg . There was a Three Layer Compression Therapy Procedure by [name], RN. Post procedure Diagnosis Wound #3: Same as Pre-Procedure Wound #4 Pre-procedure diagnosis of Wound #4 is a T be determined located on the Left,Anterior Lower Leg . There was a Selective/Open Wound Non-Viable Tissue o Debridement with a total area of 9 sq cm performed by Fredirick Maudlin, MD. With the following instrument(s): Curette to remove Non-Viable tissue/material. Material removed includes Eschar after achieving pain control using Lidocaine 4% Topical Solution. No specimens were taken. A time out was conducted at 13:34, prior to the start of the procedure. A Minimum amount of bleeding was controlled with Pressure. The procedure was tolerated well. Post Debridement Measurements: 3cm length x 3cm width x 0.1cm depth; 0.707cm^3 volume. Character of Wound/Ulcer Post Debridement is  improved. Post procedure Diagnosis Wound #4: Same as Pre-Procedure General Notes: scribed for Dr. Celine Ahr by [name], RN. [place], Weston [name] ([id number]) [id number] Page 9 of 12 Plan Follow-up Appointments: Return Appointment in 1 week. - Dr. Celine Ahr - room 2 Anesthetic: (In clinic) Topical Lidocaine 4% applied to wound bed Bathing/ Shower/ Hygiene: May shower and wash wound with soap and water. Edema Control - Lymphedema / SCD / Other: Elevate legs to the level of the heart or above for 30 minutes daily and/or when sitting for 3-4 times a day throughout the day. Avoid standing for long periods of time. Exercise regularly - including ankle circles while sitting Moisturize legs daily. - both legs nightly after removing juxtalites Compression stocking or Garment 20-30 mm/Hg pressure to: - both legs daily The following medication(s) was prescribed: lidocaine topical 4 % cream cream topical was prescribed at facility WOUND #3: - Lower Leg Wound Laterality: Left, Posterior Cleanser: Soap and Water 1 x Per Day/30 Days Discharge Instructions: May shower and wash wound with dial antibacterial soap and water prior to dressing change. Cleanser: Wound Cleanser 1 x Per Day/30 Days Discharge Instructions: Cleanse the wound with wound cleanser prior to applying a clean dressing using gauze sponges, not tissue or cotton balls. Peri-Wound Care: Sween Lotion (Moisturizing lotion) 1 x Per Day/30 Days Discharge Instructions: Apply moisturizing lotion as directed Prim Dressing: IODOFLEX 0.9% Cadexomer Iodine Pad 4x6 cm 1 x Per Day/30 Days ary Discharge Instructions: Apply to wound bed as instructed Secondary Dressing: ABD Pad, 5x9 1 x Per Day/30 Days Discharge Instructions: Apply over primary dressing as directed. Secondary Dressing: Woven Gauze Sponge, Non-Sterile 4x4 in 1 x Per Day/30 Days Discharge Instructions: Apply over primary dressing as directed. Com pression  Wrap: ThreePress (3 layer compression wrap) 1 x Per Day/30 Days Discharge Instructions: Apply three layer compression as directed. WOUND #4: - Lower Leg Wound Laterality: Left, Anterior Cleanser: Soap and Water 1 x Per Day/30 Days Discharge Instructions: May shower and wash wound with dial antibacterial soap and water prior to dressing change. Cleanser: Wound Cleanser 1 x Per Day/30 Days Discharge Instructions: Cleanse the wound with wound cleanser prior to applying a clean dressing using gauze sponges, not tissue or cotton balls. Peri-Wound Care: Sween Lotion (Moisturizing lotion) 1 x  Per Day/30 Days Discharge Instructions: Apply moisturizing lotion as directed Prim Dressing: Sorbalgon AG Dressing 2x2 (in/in) 1 x Per Day/30 Days ary Discharge Instructions: Apply to wound bed as instructed Secondary Dressing: ABD Pad, 5x9 1 x Per Day/30 Days Discharge Instructions: Apply over primary dressing as directed. Secondary Dressing: Woven Gauze Sponge, Non-Sterile 4x4 in 1 x Per Day/30 Days Discharge Instructions: Apply over primary dressing as directed. Com pression Wrap: ThreePress (3 layer compression wrap) 1 x Per Day/30 Days Discharge Instructions: Apply three layer compression as directed. 11/06/2022: On his anterior tibial surface, under a thick layer of crust and eschar, there are multiple tiny superficial wounds. On his left calf, he has a Mohs defect. It is quite deep into the fat layer and there is a substantial amount of slough and nonviable subcutaneous tissue. I used a curette to debride the eschar off of his anterior tibia. I also debrided slough and nonviable subcutaneous tissue from the posterior calf wound. We will use silver alginate to the anterior tibial surface. I think the posterior calf wound would benefit from comical debridement during the week so we will apply Iodoflex there. 3 layer compression. Follow-up in 1 week. Electronic Signature(s) Signed: 11/06/2022 2:14:17 PM By:  Fredirick Maudlin MD FACS Signed: 11/06/2022 4:31:34 PM By: [name] Previous Signature: 11/06/2022 2:01:58 PM Version By: Fredirick Maudlin MD FACS Entered By: [name] on 11/06/2022 14:05:43 -------------------------------------------------------------------------------- HxROS Details Patient Name: Date of Service: [name], [name] 11/06/2022 1:15 PM Medical Record Number: [id number] Patient Account Number: [id number] Date of Birth/Sex: Treating RN: 21-Jul-1936 (87 y.o. M) Primary Care Provider: [name] Other Clinician: Referring Provider: Treating Provider/Extender: [name] in Treatment: [place], [place] ([id number]) [id number] Page 10 of 12 Patient Eyes Medical History: Positive for: Glaucoma Cardiovascular Medical History: Positive for: Arrhythmia - afib; Coronary Artery Disease; Hypertension; Peripheral Venous Disease Gastrointestinal Medical History: Past Medical History Notes: colon polyps Endocrine Medical History: Negative for: Type I Diabetes; Type II Diabetes Past Medical History Notes: Hypothyroidism Genitourinary Medical History: Negative for: End Stage Renal Disease Past Medical History Notes: BPH Integumentary (Skin) Medical History: Negative for: History of Burn Musculoskeletal Medical History: Positive for: Osteoarthritis Past Medical History Notes: Bilat Hip Replacements, Right Knee Replacement, lumbar stenosis Neurologic Medical History: Past Medical History Notes: hydrocephalus Oncologic Medical History: Negative for: Received Chemotherapy; Received Radiation Past Medical History Notes: Skin Cancer multiple sites Psychiatric Medical History: Negative for: Anorexia/bulimia; Confinement Anxiety HBO Extended History Items Eyes: Glaucoma Immunizations Pneumococcal Vaccine: Received Pneumococcal Vaccination: Yes Received Pneumococcal  Vaccination On or After 60th Birthday: Yes Implantable Devices None Hospitalization / Surgery History Type of Hospitalization/Surgery lumbar laminectomy total knee arthropathy right BIRCH, SCHETTER ([id number]) [phone].pdf Page 11 of 12 bil total hip arthroplasty coronary stents toe surgery tonsillectomy shunt insertion Family and Social History Cancer: Yes - Father; Diabetes: Yes - Maternal Grandparents; Heart Disease: Yes - Maternal Grandparents; Hereditary Spherocytosis: No; Hypertension: Yes - Maternal Grandparents; Kidney Disease: No; Lung Disease: No; Seizures: No; Stroke: No; Thyroid Problems: No; Tuberculosis: No; Former smoker - Quit over 30 years ago; Marital Status - Married; Alcohol Use: Rarely; Drug Use: No History; Caffeine Use: Daily; Financial Concerns: No; Food, Clothing or Shelter Needs: No; Support System Lacking: No; Transportation Concerns: No Electronic Signature(s) Signed: 11/06/2022 2:14:17 PM By: Fredirick Maudlin MD FACS Entered By: Fredirick Maudlin on 11/06/2022 13:59:59 -------------------------------------------------------------------------------- SuperBill Details Patient Name: Date of Service: [name], [name] 11/06/2022 Medical Record Number: [id number]  Patient Account Number: [id number] Date of Birth/Sex: Treating RN: 1935-12-18 (87 y.o. M) Primary Care Provider: [name] Other Clinician: Referring Provider: Treating Provider/Extender: [name] in Treatment: 12 Diagnosis Coding ICD-10 Codes Code Description [phone] Non-pressure chronic ulcer of left calf with fat layer exposed L97.821 Non-pressure chronic ulcer of other part of left lower leg limited to breakdown of skin L98.9 Disorder of the skin and subcutaneous tissue, unspecified I87.2 Venous insufficiency (chronic) (peripheral) I10 Essential (primary) hypertension Facility Procedures : CPT4 Code: [id number] Description: [id number] - DEB SUBQ  TISSUE 20 SQ CM/< ICD-10 Diagnosis Description L97.222 Non-pressure chronic ulcer of left calf with fat layer exposed Modifier: Quantity: 1 : CPT4 Code: [id number] Description: [id number] - DEBRIDE WOUND 1ST 20 SQ CM OR < ICD-10 Diagnosis Description L97.821 Non-pressure chronic ulcer of other part of left lower leg limited to breakdown o Modifier: f skin Quantity: 1 Physician Procedures : CPT4 Code Description Modifier [id number] - WC PHYS LEVEL 4 - EST PT 25 ICD-10 Diagnosis Description L97.222 Non-pressure chronic ulcer of left calf with fat layer exposed L97.821 Non-pressure chronic ulcer of other part of left lower leg limited to  breakdown of skin I87.2 Venous insufficiency (chronic) (peripheral) I10 Essential (primary) hypertension Quantity: 1 : [id number] - WC PHYS SUBQ TISS 20 SQ CM ICD-10 Diagnosis Description L97.222 Non-pressure chronic ulcer of left calf with fat layer exposed Quantity: 1 : [id number] - WC PHYS DEBR WO ANESTH 20 SQ CM ICD-10 Diagnosis Description L97.821 Non-pressure chronic ulcer of other part of left lower leg limited to breakdown of skin FINDLEY, EWTON [name] ([id number]) [phone].pdf Pag Quantity: 1 [name] 12 of 12 Electronic Signature(s) Signed: 11/06/2022 2:02:22 PM By: Fredirick Maudlin MD FACS Entered By: Fredirick Maudlin on 11/06/2022 14:02:22

## 2022-11-10 ENCOUNTER — Encounter: Payer: Self-pay | Admitting: Physical Therapy

## 2022-11-10 ENCOUNTER — Ambulatory Visit: Payer: Medicare Other | Admitting: Physical Therapy

## 2022-11-10 DIAGNOSIS — M6281 Muscle weakness (generalized): Secondary | ICD-10-CM

## 2022-11-10 DIAGNOSIS — R2689 Other abnormalities of gait and mobility: Secondary | ICD-10-CM | POA: Diagnosis not present

## 2022-11-10 NOTE — Therapy (Signed)
OUTPATIENT PHYSICAL THERAPY TREATMENT NOTE    Patient Name: [name] MRN: [id number] DOB:1936/06/06, 87 y.o., male Today's Date: 11/10/2022  END OF SESSION:   PT End of Session - 11/10/22 1236     Visit Number 14    Date for PT Re-Evaluation 12/17/22    Authorization Type [employer] Medicare    Progress Note Due on Visit 51    PT Start Time 1232    PT Stop Time 1315    PT Time Calculation (min) 43 min    Equipment Utilized During Treatment Gait belt    Activity Tolerance Patient tolerated treatment well    Behavior During Therapy WFL for tasks assessed/performed                        Past Medical History:  Diagnosis Date   A-fib ([place])    Arthritis    BPH (benign prostatic hypertrophy)    Carotid artery disease ([place])    Doppler, December 08, 2011, 00 [id number] R. ICA, [id number] LICA, followup 1 year   Coronary artery disease    DES Circumflex or CVA 2006  /  clear, February, 2011, EF 70%, no ischemia or   Ejection fraction    EF 60%, echo, 2009, mildly calcified aortic leaflets   History of colonic polyps    Hyperlipidemia    Hypertension    Hypothyroidism    Lumbar radiculopathy    Multiple thyroid nodules    Avascular echogenic areas noted in the right thyroid at the time of carotid Doppler.    PONV (postoperative nausea and vomiting)    "nausea with first hip surgery"   Skin cancer (melanoma) ([place])    PMH of    Spinal stenosis, lumbar    Tremor    Fine tremor right upper extremity   Venous insufficiency    Past Surgical History:  Procedure Laterality Date   COLONOSCOPY     CORONARY ANGIOPLASTY WITH STENT PLACEMENT  2006   x 2 stents; DES to CX and dRCA '06   KNEE SURGERY  1970's   "chipped bone"   LUMBAR LAMINECTOMY/DECOMPRESSION MICRODISCECTOMY Left 07/13/2016   Procedure: LUMBAR LAMINECTOMY AND FORAMINOTOMY Lumbar two, three, Lumbar three-four, Lumbar four-five, LEFT Lumbar five-Sacral one DISECTOMY;  Surgeon: Newman Pies, MD;  Location: [place];   Service: Neurosurgery;  Laterality: Left;   TOE SURGERY  1997   TONSILLECTOMY     TOTAL HIP ARTHROPLASTY Left 2007   TOTAL HIP ARTHROPLASTY Right 2010   TOTAL KNEE ARTHROPLASTY Right 06/25/2014   Procedure: RIGHT TOTAL KNEE ARTHROPLASTY;  Surgeon: Gearlean Alf, MD;  Location: [facility];  Service: Orthopedics;  Laterality: Right;   VENTRICULOPERITONEAL SHUNT Right 07/09/2022   Procedure: SHUNT INSERTION VENTRICULAR-PERITONEAL;  Surgeon: Newman Pies, MD;  Location: [place];  Service: Neurosurgery;  Laterality: Right;   Patient Active Problem List   Diagnosis Date Noted   (Idiopathic) normal pressure hydrocephalus ([place]) 07/09/2022   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[place]) 02/05/2022   Gait disturbance 02/05/2022   Benign essential tremor 02/05/2022   Chronic anticoagulation 02/05/2022   Atrial fibrillation ([place]) 02/08/2019   Lumbar stenosis with neurogenic claudication 07/13/2016   Annual physical exam>>>>>>>>>>>>>>>>>> 05/04/2016   PCP NOTES>>>>>>>>>>>>>>>>>>>>>>>>>>> 12/31/2015   Edema 02/19/2015   OA (osteoarthritis) of knee 06/25/2014   Unspecified sleep apnea 06/18/2014   Multiple thyroid nodules    Tremor    Carotid artery disease ([facility])    Erectile dysfunction 05/31/2012   Coronary artery disease  Venous insufficiency    Hyperglycemia 11/09/2008   MELANOMA, HX OF 11/09/2008   COLONIC POLYPS, HYPERPLASTIC 05/10/2008   Hypothyroidism 08/10/2007   HYPERLIPIDEMIA 08/10/2007   Essential hypertension 08/10/2007   DJD (degenerative joint disease) 08/08/2007   MURMUR 08/08/2007   BENIGN PROSTATIC HYPERTROPHY, HX OF 08/05/2007     THERAPY DIAG:  Other abnormalities of gait and mobility  Muscle weakness (generalized)   PCP: [name], MD   REFERRING PROVIDER: Newman Pies, MD   REFERRING DIAG: G91.1 (ICD-10-CM) - Obstructive hydrocephalus R26.9 (ICD-10-CM) - Gait disturbance   Rationale for Evaluation and Treatment: Rehabilitation   THERAPY DIAG:  Other  abnormalities of gait and mobility   Muscle weakness (generalized)   ONSET DATE: chronic - has done PT before multiple times for strength, balance etc   SUBJECTIVE:  I am cleared to exercise (Pt has skin surgery on Lt leg).  I am feeling good.                                                                                                        SUBJECTIVE STATEMENT:   Eval: Pt is referred to OPPT for gait training following VP shunt placement 07/09/22 for communicating hydrocephalus. Has done PT multiple times before and wants to do it again for strength, walking and balance.  He uses a SPC in community and either furniture surfs or uses cane at home. He feels he can do some walking without it.   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  His wife reports he can't go shopping with her due to poor endurance.    PERTINENT HISTORY:  VP shunt placed 07/09/22 for communicating hydrocephalus PMH includes lumbar surgery, bil THA, Rt TKA, a-fib, CAD, coronary angioplasty with stent, compromised ejection fraction   PAIN:  PAIN:  Are you having pain? No       PRECAUTIONS: Other: heart history, VP shunt placed 06/2022 for communicating hydrocephalus   WEIGHT BEARING RESTRICTIONS: No   FALLS:  Has patient fallen in last 6 months? No   LIVING ENVIRONMENT: Lives with: lives with their spouse Lives in: House/apartment Stairs: Yes: Internal: 14 steps; can reach both and External: 4 steps; can reach both, bedroom on main level  Has following equipment at home: Single point cane - uses it in community and furniture surfs at home or uses cane   OCCUPATION: retired   PLOF: Independent with household mobility with device and Independent with community mobility with device   PATIENT GOALS: strength, balance and gait   NEXT MD VISIT:    OBJECTIVE:    DIAGNOSTIC FINDINGS:  N/a   PATIENT SURVEYS:      SCREENING FOR RED FLAGS: Bowel or bladder incontinence: Yes: leaking  after urination and if gets too full Spinal tumors: No Cauda equina syndrome: No Compression fracture: No Abdominal aneurysm: No   COGNITION: Overall cognitive status: Within functional limits for tasks assessed  SENSATION: WFL   MUSCLE LENGTH: Hamstrings: Right 45 deg; Left 45 deg     POSTURE:  10/13/22: improved postural awareness, needs intermittent cueing to stand tall  Evaluation: flexed trunk  and weight shift right   PALPATION: N/a   LUMBAR ROM:    AROM eval 10/13/22 10/22/22  Flexion Flexes knees with fingers to mid shin Flexes knees with fingers to mid shin Flexes knees with fingers to mid shin  Extension NT 8 8  Right lateral flexion 5 8 8  $ Left lateral flexion 8 8 8  $ Right rotation 25% 50% 50%  Left rotation 25% 50% 50%   (Blank rows = not tested)   LOWER EXTREMITY ROM:    Hip ROM grossly limited in ER 50% bil   LOWER EXTREMITY MMT:   10/13/22: 4+/5 bil LE  Eval: Grossly 4/5 on Rt Poor glut med and quad activation in stance phase of gait   LUMBAR SPECIAL TESTS:      FUNCTIONAL TESTS:  10/22/22: 5 times sit to stand: 14 sec no UE Timed up and go (TUG): 17 sec with SPC, 16 sec with SPC Berg Balance Scale: 50/56     10/08/22:  5x sit to stand no UE assist 14 sec TUG:16 sec with SPC  Eval: 5 times sit to stand: 17 sec mod/max use of hands on chair Timed up and go (TUG): 22 sec with SPC Berg Balance Scale: 39/56   GAIT: Distance walked: 3MWT Assistive device utilized: Single point cane Level of assistance: Modified independence Comments: with SPC, covers 340' with SOB at 1:30, needs seated break   TODAY'S TREATMENT:  11/10/22: NuStep L5 x 10' PT present to monitor Standing march taps to 6" step alt LE 2x20 with bil rail Standing hip ext and abd 2x10 Stairs up/down x 3 rounds with bil rail x 2, single rail x1 (4x 6" steps) Standing bil heel raise x 20 Hip abd and ext x 10 each Gait belt and CGA: fwd step with UE  reach x 5 each, sidestepping each way x 2 rounds 1' walk from ortho to cancer gym Hurdle forward step overs in parallel bars x 3 laps, step to pattern 1' walk from cancer gym to ortho gym  10/22/22: NuStep L6 x 5' 5x STS and TUG test (see above) BERG balance (see above) Standing blue bil row x 20 and bil shoulder ext x 20 3MWT covering 334' needing break at 1:45" due to SOB Getting in/out of car - backseat and front passenger seat  10/20/22: 3MWT 350 feet with seated break at 2' for SOB NuStep L5 x10' PT present to monitor Outdoor ambulation with gait belt and SPC: level surface, curbs up/down without rail, ramp up/down Stagger stance with head and trunk twists Square stance with cross body punches with trunk twist  10/15/22 NuStep L5 x 10' PT present to monitor 2lb ankle weights 2x10: hip abd, high knee march, hip ext Seated chest and overhead press x 7 rounds Step over low disc and back x 5 each leg, close supervision and gait belt Fwd step with ipsilateral reach alt x 10, contralateral reach x 10, close supervision and gait belt Stagger stance balance 2x10", with head turns and trunk twists intermittent UE support Sit to stand x 5 no UE assist 3lb LAQ 2x10 3'12" walk with [facility] covering 332' with seated break at 1:40   PATIENT EDUCATION:  Education details: Access Code: [email or web address] Person educated: Patient and Spouse Education method: Explanation, Demonstration, and Handouts Education comprehension:  verbalized understanding and returned demonstration   HOME EXERCISE PROGRAM: Access Code: [email or web address] URL: [email or web address] Date: 09/30/2022 Prepared by: Venetia Night Marytza Grandpre  Exercises - Seated Long Arc Quad  - 1 x daily - 7 x weekly - 1 sets - 10 reps - 5 hold - Standing Hip Abduction with Counter Support  - 1 x daily - 7 x weekly - 1 sets - 10 reps - Standing Hip Extension with Counter Support  - 1 x daily - 7 x weekly - 1 sets - 10 reps - Heel Raises with Counter  Support  - 1 x daily - 7 x weekly - 1 sets - 10 reps - Seated Hip Adduction Isometrics with Ball  - 1 x daily - 7 x weekly - 1 sets - 10 reps - Seated March  - 1 x daily - 7 x weekly - 1 sets - 10 reps - Ankle Pumps in Elevation  - 1 x daily - 7 x weekly - 1 sets - 10 reps - Forward Step Up  - 3 x daily - 7 x weekly - 2 sets - 10 reps - Standing Forward Toe Taps on Box (BKA)  - 3 x daily - 7 x weekly - 2 sets - 10 reps - Sit to Stand Without Arm Support  - 3 x daily - 7 x weekly - 1 sets - 10 reps - Standing Row with Anchored Resistance  - 1 x daily - 7 x weekly - 2 sets - 10 reps - Shoulder extension with resistance - Neutral  - 1 x daily - 7 x weekly - 2 sets - 10 reps   ASSESSMENT:   CLINICAL IMPRESSION: Pt returns to PT after 2 weeks lapse secondary to LE skin surgery.  He is cleared to exercise and has wrap on Lt LE.  PT avoided placing LE ankle weights on today secondary to healing stages of skin on lower calf.  PT used CGA and gait belt for stepping with UE reach and sidestepping.  Pt is safest using step to pattern on stairs given only single rail present at home.  He tends to pull on bil rail otherwise.  Pt with difficulty with hurdle step overs in parallel bars with first round but this improved to no contact with hurdles rounds 2 and 3.  Continue along POC.   OBJECTIVE IMPAIRMENTS: Abnormal gait, cardiopulmonary status limiting activity, decreased activity tolerance, decreased balance, decreased coordination, decreased endurance, decreased knowledge of condition, decreased mobility, difficulty walking, decreased ROM, decreased strength, decreased safety awareness, hypomobility, increased edema, impaired flexibility, improper body mechanics, and postural dysfunction.    ACTIVITY LIMITATIONS: carrying, lifting, bending, standing, squatting, stairs, transfers, dressing, and locomotion level   PARTICIPATION LIMITATIONS: meal prep, cleaning, laundry, shopping, community activity, and yard  work   PERSONAL FACTORS: Age, Time since onset of injury/illness/exacerbation, and 3+ comorbidities: + cardiac history, hydrocephalus with shunt, LE edema, Rt LE weakness with Hx of THA and TKA  are also affecting patient's functional outcome.    REHAB POTENTIAL: Good   CLINICAL DECISION MAKING: Stable/uncomplicated   EVALUATION COMPLEXITY: Low     GOALS: Goals reviewed with patient? Yes   SHORT TERM GOALS: Target date: 09/24/22   Pt will be ind with initial HEP Baseline: Goal status: met   2.  Pt will improve 5X sit to stand to </= 15 sec with min use of UE Baseline: 17 sec with mod/max use of UE Goal status: met, 14 sec   3.  Pt will demo  improved quad and lateral hip muscle activations on Rt with stance phase of gait. Baseline:  Goal status: met   4.  Improve TUG to 19 sec or less. Baseline: 22 sec Goal status: met, 16 sec      LONG TERM GOALS: Target date: 10/22/22   Pt will be ind with advanced HEP to maintain gains from PT. Baseline:  Goal status: ongoing   2.  Pt will improve TUG to 17 sec or less to demo improved efficiency and safety with functional mobility. Baseline:  Goal status: met, 16 sec   3.  Improve 5X sit to stand to 13 sec or less with min or no use of UE to demo reduced fall risk Baseline: 17 sec Goal status: ongoing, 14 sec   4.  Pt will demo improved gait endurance to allow for a 3 min walk test. Baseline: covered 350' on 10/20/22 Goal status: needed break at 2:00 for SOB   5.  Pt will improve LE strength to at least 4+/5 on Rt for improved gait safety, transfers and stairs Baseline: 4/5 Goal status: INITIAL   6.  Improve BERG Balance Test to at least 44/52 to demo improved static and dynamic balance to reduce fall risk. Baseline: 39/52 Goal status: met on 10/13/22   PLAN:   PT FREQUENCY: 2x/week   PT DURATION: 8 weeks   PLANNED INTERVENTIONS: Therapeutic exercises, Therapeutic activity, Neuromuscular re-education, Balance training,  Gait training, Patient/Family education, Self Care, Joint mobilization, Stair training, DME instructions, Electrical stimulation, Spinal mobilization, Cryotherapy, Moist heat, and Manual therapy.   PLAN FOR NEXT SESSION: repeat 3MWT, ERO with outdoor ambulation with cane, car transfers, continue balance, functional strength , postural strength, add hip mobility and LE stretching focus for car transfers  Baruch Merl, PT 11/10/22 1:26 PM

## 2022-11-12 ENCOUNTER — Encounter: Payer: Self-pay | Admitting: Physical Therapy

## 2022-11-12 ENCOUNTER — Ambulatory Visit: Payer: Medicare Other | Admitting: Physical Therapy

## 2022-11-12 DIAGNOSIS — M6281 Muscle weakness (generalized): Secondary | ICD-10-CM

## 2022-11-12 DIAGNOSIS — R2689 Other abnormalities of gait and mobility: Secondary | ICD-10-CM | POA: Diagnosis not present

## 2022-11-12 NOTE — Therapy (Signed)
OUTPATIENT PHYSICAL THERAPY TREATMENT NOTE    Patient Name: [name] MRN: [id number] DOB:Jun 26, 1936, 87 y.o., male Today's Date: 11/12/2022  END OF SESSION:   PT End of Session - 11/12/22 1449     Visit Number 15    Date for PT Re-Evaluation 12/17/22    Authorization Type [employer] Medicare    Progress Note Due on Visit 65    PT Start Time 1445    PT Stop Time 1530    PT Time Calculation (min) 45 min    Equipment Utilized During Treatment Gait belt    Activity Tolerance Patient tolerated treatment well    Behavior During Therapy [facility] for tasks assessed/performed                        Past Medical History:  Diagnosis Date   A-fib ([place])    Arthritis    BPH (benign prostatic hypertrophy)    Carotid artery disease ([place])    Doppler, December 08, 2011, 00 [id number] R. ICA, [id number] LICA, followup 1 year   Coronary artery disease    DES Circumflex or CVA 2006  /  clear, February, 2011, EF 70%, no ischemia or   Ejection fraction    EF 60%, echo, 2009, mildly calcified aortic leaflets   History of colonic polyps    Hyperlipidemia    Hypertension    Hypothyroidism    Lumbar radiculopathy    Multiple thyroid nodules    Avascular echogenic areas noted in the right thyroid at the time of carotid Doppler.    PONV (postoperative nausea and vomiting)    "nausea with first hip surgery"   Skin cancer (melanoma) ([place])    PMH of    Spinal stenosis, lumbar    Tremor    Fine tremor right upper extremity   Venous insufficiency    Past Surgical History:  Procedure Laterality Date   COLONOSCOPY     CORONARY ANGIOPLASTY WITH STENT PLACEMENT  2006   x 2 stents; DES to CX and dRCA '06   KNEE SURGERY  1970's   "chipped bone"   LUMBAR LAMINECTOMY/DECOMPRESSION MICRODISCECTOMY Left 07/13/2016   Procedure: LUMBAR LAMINECTOMY AND FORAMINOTOMY Lumbar two, three, Lumbar three-four, Lumbar four-five, LEFT Lumbar five-Sacral one DISECTOMY;  Surgeon: Newman Pies, MD;  Location: [place];   Service: Neurosurgery;  Laterality: Left;   TOE SURGERY  1997   TONSILLECTOMY     TOTAL HIP ARTHROPLASTY Left 2007   TOTAL HIP ARTHROPLASTY Right 2010   TOTAL KNEE ARTHROPLASTY Right 06/25/2014   Procedure: RIGHT TOTAL KNEE ARTHROPLASTY;  Surgeon: Gearlean Alf, MD;  Location: [facility];  Service: Orthopedics;  Laterality: Right;   VENTRICULOPERITONEAL SHUNT Right 07/09/2022   Procedure: SHUNT INSERTION VENTRICULAR-PERITONEAL;  Surgeon: Newman Pies, MD;  Location: [place];  Service: Neurosurgery;  Laterality: Right;   Patient Active Problem List   Diagnosis Date Noted   (Idiopathic) normal pressure hydrocephalus ([place]) 07/09/2022   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[place]) 02/05/2022   Gait disturbance 02/05/2022   Benign essential tremor 02/05/2022   Chronic anticoagulation 02/05/2022   Atrial fibrillation ([place]) 02/08/2019   Lumbar stenosis with neurogenic claudication 07/13/2016   Annual physical exam>>>>>>>>>>>>>>>>>> 05/04/2016   PCP NOTES>>>>>>>>>>>>>>>>>>>>>>>>>>> 12/31/2015   Edema 02/19/2015   OA (osteoarthritis) of knee 06/25/2014   Unspecified sleep apnea 06/18/2014   Multiple thyroid nodules    Tremor    Carotid artery disease ([facility])    Erectile dysfunction 05/31/2012   Coronary artery disease  Venous insufficiency    Hyperglycemia 11/09/2008   MELANOMA, HX OF 11/09/2008   COLONIC POLYPS, HYPERPLASTIC 05/10/2008   Hypothyroidism 08/10/2007   HYPERLIPIDEMIA 08/10/2007   Essential hypertension 08/10/2007   DJD (degenerative joint disease) 08/08/2007   MURMUR 08/08/2007   BENIGN PROSTATIC HYPERTROPHY, HX OF 08/05/2007     THERAPY DIAG:  Other abnormalities of gait and mobility  Muscle weakness (generalized)   PCP: [name], MD   REFERRING PROVIDER: Newman Pies, MD   REFERRING DIAG: G91.1 (ICD-10-CM) - Obstructive hydrocephalus R26.9 (ICD-10-CM) - Gait disturbance   Rationale for Evaluation and Treatment: Rehabilitation   THERAPY DIAG:  Other  abnormalities of gait and mobility   Muscle weakness (generalized)   ONSET DATE: chronic - has done PT before multiple times for strength, balance etc   SUBJECTIVE: I am feeling good.                                                                                                        SUBJECTIVE STATEMENT:   Eval: Pt is referred to [facility]PPT for gait training following VP shunt placement 07/09/22 for communicating hydrocephalus. Has done PT multiple times before and wants to do it again for strength, walking and balance.  He uses a SPC in community and either furniture surfs or uses cane at home. He feels he can do some walking without it.   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  His wife reports he can't go shopping with her due to poor endurance.    PERTINENT HISTORY:  VP shunt placed 07/09/22 for communicating hydrocephalus PMH includes lumbar surgery, bil THA, Rt TKA, a-fib, CAD, coronary angioplasty with stent, compromised ejection fraction   PAIN:  PAIN:  Are you having pain? No       PRECAUTIONS: Other: heart history, VP shunt placed 06/2022 for communicating hydrocephalus   WEIGHT BEARING RESTRICTIONS: No   FALLS:  Has patient fallen in last 6 months? No   LIVING ENVIRONMENT: Lives with: lives with their spouse Lives in: House/apartment Stairs: Yes: Internal: 14 steps; can reach both and External: 4 steps; can reach both, bedroom on main level  Has following equipment at home: Single point cane - uses it in community and furniture surfs at home or uses cane   OCCUPATION: retired   PLOF: Independent with household mobility with device and Independent with community mobility with device   PATIENT GOALS: strength, balance and gait   NEXT MD VISIT:    OBJECTIVE:    DIAGNOSTIC FINDINGS:  N/a   PATIENT SURVEYS:      SCREENING FOR RED FLAGS: Bowel or bladder incontinence: Yes: leaking after urination and if gets too full Spinal tumors:  No Cauda equina syndrome: No Compression fracture: No Abdominal aneurysm: No   COGNITION: Overall cognitive status: Within functional limits for tasks assessed                          SENSATION: WFL   MUSCLE LENGTH: Hamstrings: Right 45 deg; Left 45 deg  POSTURE:  10/13/22: improved postural awareness, needs intermittent cueing to stand tall  Evaluation: flexed trunk  and weight shift right   PALPATION: N/a   LUMBAR ROM:    AROM eval 10/13/22 10/22/22  Flexion Flexes knees with fingers to mid shin Flexes knees with fingers to mid shin Flexes knees with fingers to mid shin  Extension NT 8 8  Right lateral flexion 5 8 8  $ Left lateral flexion 8 8 8  $ Right rotation 25% 50% 50%  Left rotation 25% 50% 50%   (Blank rows = not tested)   LOWER EXTREMITY ROM:    Hip ROM grossly limited in ER 50% bil   LOWER EXTREMITY MMT:   10/13/22: 4+/5 bil LE  Eval: Grossly 4/5 on Rt Poor glut med and quad activation in stance phase of gait   LUMBAR SPECIAL TESTS:      FUNCTIONAL TESTS:  10/22/22: 5 times sit to stand: 14 sec no UE Timed up and go (TUG): 17 sec with SPC, 16 sec with SPC Berg Balance Scale: 50/56     10/08/22:  5x sit to stand no UE assist 14 sec TUG:16 sec with SPC  Eval: 5 times sit to stand: 17 sec mod/max use of hands on chair Timed up and go (TUG): 22 sec with SPC Berg Balance Scale: 39/56   GAIT: Distance walked: 3MWT Assistive device utilized: Single point cane Level of assistance: Modified independence Comments: with SPC, covers 340' with SOB at 1:30, needs seated break   TODAY'S TREATMENT:  11/12/22: NuStep L6 x 4', decreased to L5x 6', PT present to monitor Hip matrix 1x10 bil LE hip abd/ext 25lb Leg press seat 9 90lb bil LE 1x10, 1x16 Outdoor ambulation with SPC, with gait belt and close supervision/CGA: level surface, grassy surface, incline/decline, curbs, ramps  11/10/22: NuStep L5 x 10' PT present to monitor Standing march taps to 6"  step alt LE 2x20 with bil rail Standing hip ext and abd 2x10 Stairs up/down x 3 rounds with bil rail x 2, single rail x1 (4x 6" steps) Standing bil heel raise x 20 Hip abd and ext x 10 each Gait belt and CGA: fwd step with UE reach x 5 each, sidestepping each way x 2 rounds 1' walk from ortho to cancer gym Hurdle forward step overs in parallel bars x 3 laps, step to pattern 1' walk from cancer gym to ortho gym  10/22/22: NuStep L6 x 5' 5x STS and TUG test (see above) BERG balance (see above) Standing blue bil row x 20 and bil shoulder ext x 20 3MWT covering 334' needing break at 1:45" due to SOB Getting in/out of car - backseat and front passenger seat  10/20/22: 3MWT 350 feet with seated break at 2' for SOB NuStep L5 x10' PT present to monitor Outdoor ambulation with gait belt and SPC: level surface, curbs up/down without rail, ramp up/down Stagger stance with head and trunk twists Square stance with cross body punches with trunk twist    PATIENT EDUCATION:  Education details: Access Code: [email or web address] Person educated: Patient and Spouse Education method: Explanation, Demonstration, and Handouts Education comprehension: verbalized understanding and returned demonstration   HOME EXERCISE PROGRAM: Access Code: [email or web address] URL: [email or web address] Date: 09/30/2022 Prepared by: Venetia Night Ryna Beckstrom  Exercises - Seated Long Arc Quad  - 1 x daily - 7 x weekly - 1 sets - 10 reps - 5 hold - Standing Hip Abduction with Counter Support  - 1 x daily - 7 x weekly - 1  sets - 10 reps - Standing Hip Extension with Counter Support  - 1 x daily - 7 x weekly - 1 sets - 10 reps - Heel Raises with Counter Support  - 1 x daily - 7 x weekly - 1 sets - 10 reps - Seated Hip Adduction Isometrics with Ball  - 1 x daily - 7 x weekly - 1 sets - 10 reps - Seated March  - 1 x daily - 7 x weekly - 1 sets - 10 reps - Ankle Pumps in Elevation  - 1 x daily - 7 x weekly - 1 sets - 10 reps - Forward  Step Up  - 3 x daily - 7 x weekly - 2 sets - 10 reps - Standing Forward Toe Taps on Box (BKA)  - 3 x daily - 7 x weekly - 2 sets - 10 reps - Sit to Stand Without Arm Support  - 3 x daily - 7 x weekly - 1 sets - 10 reps - Standing Row with Anchored Resistance  - 1 x daily - 7 x weekly - 2 sets - 10 reps - Shoulder extension with resistance - Neutral  - 1 x daily - 7 x weekly - 2 sets - 10 reps   ASSESSMENT:   CLINICAL IMPRESSION: Pt was able to transition to LE strengthening machines today using standing hip matrix and leg press.  He was greatly challenged cardiovascularly with outdoor ambulation on varied surfaces, incline/decline and curbs.  He is quick to get SOB but recovered quickly with standing breaks.  Continue along POC.   OBJECTIVE IMPAIRMENTS: Abnormal gait, cardiopulmonary status limiting activity, decreased activity tolerance, decreased balance, decreased coordination, decreased endurance, decreased knowledge of condition, decreased mobility, difficulty walking, decreased ROM, decreased strength, decreased safety awareness, hypomobility, increased edema, impaired flexibility, improper body mechanics, and postural dysfunction.    ACTIVITY LIMITATIONS: carrying, lifting, bending, standing, squatting, stairs, transfers, dressing, and locomotion level   PARTICIPATION LIMITATIONS: meal prep, cleaning, laundry, shopping, community activity, and yard work   PERSONAL FACTORS: Age, Time since onset of injury/illness/exacerbation, and 3+ comorbidities: + cardiac history, hydrocephalus with shunt, LE edema, Rt LE weakness with Hx of THA and TKA  are also affecting patient's functional outcome.    REHAB POTENTIAL: Good   CLINICAL DECISION MAKING: Stable/uncomplicated   EVALUATION COMPLEXITY: Low     GOALS: Goals reviewed with patient? Yes   SHORT TERM GOALS: Target date: 09/24/22   Pt will be ind with initial HEP Baseline: Goal status: met   2.  Pt will improve 5X sit to stand to  </= 15 sec with min use of UE Baseline: 17 sec with mod/max use of UE Goal status: met, 14 sec   3.  Pt will demo improved quad and lateral hip muscle activations on Rt with stance phase of gait. Baseline:  Goal status: met   4.  Improve TUG to 19 sec or less. Baseline: 22 sec Goal status: met, 16 sec      LONG TERM GOALS: Target date: 10/22/22   Pt will be ind with advanced HEP to maintain gains from PT. Baseline:  Goal status: ongoing   2.  Pt will improve TUG to 17 sec or less to demo improved efficiency and safety with functional mobility. Baseline:  Goal status: met, 16 sec   3.  Improve 5X sit to stand to 13 sec or less with min or no use of UE to demo reduced fall risk Baseline: 17 sec Goal status:  ongoing, 14 sec   4.  Pt will demo improved gait endurance to allow for a 3 min walk test. Baseline: covered 350' on 10/20/22 Goal status: needed break at 2:00 for SOB   5.  Pt will improve LE strength to at least 4+/5 on Rt for improved gait safety, transfers and stairs Baseline: 4/5 Goal status: INITIAL   6.  Improve BERG Balance Test to at least 44/52 to demo improved static and dynamic balance to reduce fall risk. Baseline: 39/52 Goal status: met on 10/13/22   PLAN:   PT FREQUENCY: 2x/week   PT DURATION: 8 weeks   PLANNED INTERVENTIONS: Therapeutic exercises, Therapeutic activity, Neuromuscular re-education, Balance training, Gait training, Patient/Family education, Self Care, Joint mobilization, Stair training, DME instructions, Electrical stimulation, Spinal mobilization, Cryotherapy, Moist heat, and Manual therapy.   PLAN FOR NEXT SESSION: outdoor amb, hip matrix and leg press, repeat 3MWT, ERO with outdoor ambulation with cane, car transfers, continue balance, functional strength , postural strength, add hip mobility and LE stretching focus for car transfers  Baruch Merl, PT 11/12/22 3:32 PM

## 2022-11-16 ENCOUNTER — Encounter (HOSPITAL_BASED_OUTPATIENT_CLINIC_OR_DEPARTMENT_OTHER): Payer: Medicare Other | Admitting: General Surgery

## 2022-11-16 DIAGNOSIS — I480 Paroxysmal atrial fibrillation: Secondary | ICD-10-CM | POA: Diagnosis not present

## 2022-11-16 DIAGNOSIS — L97821 Non-pressure chronic ulcer of other part of left lower leg limited to breakdown of skin: Secondary | ICD-10-CM | POA: Diagnosis not present

## 2022-11-16 DIAGNOSIS — E785 Hyperlipidemia, unspecified: Secondary | ICD-10-CM | POA: Diagnosis not present

## 2022-11-16 DIAGNOSIS — Z7901 Long term (current) use of anticoagulants: Secondary | ICD-10-CM | POA: Diagnosis not present

## 2022-11-16 DIAGNOSIS — L988 Other specified disorders of the skin and subcutaneous tissue: Secondary | ICD-10-CM | POA: Diagnosis not present

## 2022-11-16 DIAGNOSIS — I872 Venous insufficiency (chronic) (peripheral): Secondary | ICD-10-CM | POA: Diagnosis not present

## 2022-11-16 DIAGNOSIS — L97222 Non-pressure chronic ulcer of left calf with fat layer exposed: Secondary | ICD-10-CM | POA: Diagnosis not present

## 2022-11-16 DIAGNOSIS — S81802A Unspecified open wound, left lower leg, initial encounter: Secondary | ICD-10-CM | POA: Diagnosis not present

## 2022-11-16 DIAGNOSIS — I10 Essential (primary) hypertension: Secondary | ICD-10-CM | POA: Diagnosis not present

## 2022-11-16 DIAGNOSIS — I2510 Atherosclerotic heart disease of native coronary artery without angina pectoris: Secondary | ICD-10-CM | POA: Diagnosis not present

## 2022-11-17 ENCOUNTER — Ambulatory Visit: Payer: Medicare Other | Admitting: Physical Therapy

## 2022-11-17 ENCOUNTER — Encounter: Payer: Self-pay | Admitting: Physical Therapy

## 2022-11-17 DIAGNOSIS — M6281 Muscle weakness (generalized): Secondary | ICD-10-CM | POA: Diagnosis not present

## 2022-11-17 DIAGNOSIS — R2689 Other abnormalities of gait and mobility: Secondary | ICD-10-CM | POA: Diagnosis not present

## 2022-11-17 NOTE — Therapy (Signed)
OUTPATIENT PHYSICAL THERAPY TREATMENT NOTE    Patient Name: [name] MRN: [id number] DOB:[birth year], 87 y.o., male Today's Date: 11/17/2022  END OF SESSION:   PT End of Session - 11/17/22 1237     Visit Number 16    Date for PT Re-Evaluation 12/17/22    Authorization Type [employer] Medicare    Progress Note Due on Visit 7    PT Start Time 20    PT Stop Time 1313    PT Time Calculation (min) 43 min    Equipment Utilized During Treatment Gait belt    Activity Tolerance Patient tolerated treatment well    Behavior During Therapy WFL for tasks assessed/performed                         Past Medical History:  Diagnosis Date   A-fib ([place])    Arthritis    BPH (benign prostatic hypertrophy)    Carotid artery disease ([place])    Doppler, December 08, 2011, 00 [id number] R. ICA, [id number] LICA, followup 1 year   Coronary artery disease    DES Circumflex or CVA 2006  /  clear, February, 2011, EF 70%, no ischemia or   Ejection fraction    EF 60%, echo, 2009, mildly calcified aortic leaflets   History of colonic polyps    Hyperlipidemia    Hypertension    Hypothyroidism    Lumbar radiculopathy    Multiple thyroid nodules    Avascular echogenic areas noted in the right thyroid at the time of carotid Doppler.    PONV (postoperative nausea and vomiting)    "nausea with first hip surgery"   Skin cancer (melanoma) ([place])    PMH of    Spinal stenosis, lumbar    Tremor    Fine tremor right upper extremity   Venous insufficiency    Past Surgical History:  Procedure Laterality Date   COLONOSCOPY     CORONARY ANGIOPLASTY WITH STENT PLACEMENT  2006   x 2 stents; DES to CX and dRCA '06   KNEE SURGERY  1970's   "chipped bone"   LUMBAR LAMINECTOMY/DECOMPRESSION MICRODISCECTOMY Left 07/13/2016   Procedure: LUMBAR LAMINECTOMY AND FORAMINOTOMY Lumbar two, three, Lumbar three-four, Lumbar four-five, LEFT Lumbar five-Sacral one DISECTOMY;  Surgeon: Newman Pies, MD;  Location: [place];   Service: Neurosurgery;  Laterality: Left;   TOE SURGERY  1997   TONSILLECTOMY     TOTAL HIP ARTHROPLASTY Left 2007   TOTAL HIP ARTHROPLASTY Right 2010   TOTAL KNEE ARTHROPLASTY Right 06/25/2014   Procedure: RIGHT TOTAL KNEE ARTHROPLASTY;  Surgeon: Gearlean Alf, MD;  Location: [facility];  Service: Orthopedics;  Laterality: Right;   VENTRICULOPERITONEAL SHUNT Right 07/09/2022   Procedure: SHUNT INSERTION VENTRICULAR-PERITONEAL;  Surgeon: Newman Pies, MD;  Location: [place];  Service: Neurosurgery;  Laterality: Right;   Patient Active Problem List   Diagnosis Date Noted   (Idiopathic) normal pressure hydrocephalus ([place]) 07/09/2022   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[place]) 02/05/2022   Gait disturbance 02/05/2022   Benign essential tremor 02/05/2022   Chronic anticoagulation 02/05/2022   Atrial fibrillation ([place]) 02/08/2019   Lumbar stenosis with neurogenic claudication 07/13/2016   Annual physical exam>>>>>>>>>>>>>>>>>> 05/04/2016   PCP NOTES>>>>>>>>>>>>>>>>>>>>>>>>>>> 12/31/2015   Edema 02/19/2015   OA (osteoarthritis) of knee 06/25/2014   Unspecified sleep apnea 06/18/2014   Multiple thyroid nodules    Tremor    Carotid artery disease ([facility])    Erectile dysfunction 05/31/2012   Coronary artery disease  Venous insufficiency    Hyperglycemia 11/09/2008   MELANOMA, HX OF 11/09/2008   COLONIC POLYPS, HYPERPLASTIC 05/10/2008   Hypothyroidism 08/10/2007   HYPERLIPIDEMIA 08/10/2007   Essential hypertension 08/10/2007   DJD (degenerative joint disease) 08/08/2007   MURMUR 08/08/2007   BENIGN PROSTATIC HYPERTROPHY, HX OF 08/05/2007     THERAPY DIAG:  Other abnormalities of gait and mobility  Muscle weakness (generalized)   PCP: [name], MD   REFERRING PROVIDER: Newman Pies, MD   REFERRING DIAG: G91.1 (ICD-10-CM) - Obstructive hydrocephalus R26.9 (ICD-10-CM) - Gait disturbance   Rationale for Evaluation and Treatment: Rehabilitation   THERAPY DIAG:  Other  abnormalities of gait and mobility   Muscle weakness (generalized)   ONSET DATE: chronic - has done PT before multiple times for strength, balance etc   SUBJECTIVE: I did fine after the new exercises last time.  I liked doing the machines.                                                                                                   SUBJECTIVE STATEMENT:   Eval: Pt is referred to OPPT for gait training following VP shunt placement 07/09/22 for communicating hydrocephalus. Has done PT multiple times before and wants to do it again for strength, walking and balance.  He uses a SPC in community and either furniture surfs or uses cane at home. He feels he can do some walking without it.   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  His wife reports he can't go shopping with her due to poor endurance.    PERTINENT HISTORY:  VP shunt placed 07/09/22 for communicating hydrocephalus PMH includes lumbar surgery, bil THA, Rt TKA, a-fib, CAD, coronary angioplasty with stent, compromised ejection fraction   PAIN:  PAIN:  Are you having pain? No       PRECAUTIONS: Other: heart history, VP shunt placed 06/2022 for communicating hydrocephalus   WEIGHT BEARING RESTRICTIONS: No   FALLS:  Has patient fallen in last 6 months? No   LIVING ENVIRONMENT: Lives with: lives with their spouse Lives in: House/apartment Stairs: Yes: Internal: 14 steps; can reach both and External: 4 steps; can reach both, bedroom on main level  Has following equipment at home: Single point cane - uses it in community and furniture surfs at home or uses cane   OCCUPATION: retired   PLOF: Independent with household mobility with device and Independent with community mobility with device   PATIENT GOALS: strength, balance and gait   NEXT MD VISIT:    OBJECTIVE:    DIAGNOSTIC FINDINGS:  N/a   PATIENT SURVEYS:      SCREENING FOR RED FLAGS: Bowel or bladder incontinence: Yes: leaking after  urination and if gets too full Spinal tumors: No Cauda equina syndrome: No Compression fracture: No Abdominal aneurysm: No   COGNITION: Overall cognitive status: Within functional limits for tasks assessed                          SENSATION: WFL   MUSCLE LENGTH: Hamstrings: Right  45 deg; Left 45 deg     POSTURE:  10/13/22: improved postural awareness, needs intermittent cueing to stand tall  Evaluation: flexed trunk  and weight shift right   PALPATION: N/a   LUMBAR ROM:    AROM eval 10/13/22 10/22/22  Flexion Flexes knees with fingers to mid shin Flexes knees with fingers to mid shin Flexes knees with fingers to mid shin  Extension NT 8 8  Right lateral flexion '5 8 8  '$ Left lateral flexion '8 8 8  '$ Right rotation 25% 50% 50%  Left rotation 25% 50% 50%   (Blank rows = not tested)   LOWER EXTREMITY ROM:    Hip ROM grossly limited in ER 50% bil   LOWER EXTREMITY MMT:   10/13/22: 4+/5 bil LE  Eval: Grossly 4/5 on Rt Poor glut med and quad activation in stance phase of gait   LUMBAR SPECIAL TESTS:      FUNCTIONAL TESTS:  10/22/22: 5 times sit to stand: 14 sec no UE Timed up and go (TUG): 17 sec with SPC, 16 sec with SPC Berg Balance Scale: 50/56     10/08/22:  5x sit to stand no UE assist 14 sec TUG:16 sec with SPC  Eval: 5 times sit to stand: 17 sec mod/max use of hands on chair Timed up and go (TUG): 22 sec with SPC Berg Balance Scale: 39/56   GAIT: Distance walked: 3MWT Assistive device utilized: Single point cane Level of assistance: Modified independence Comments: with SPC, covers 340' with SOB at 1:30, needs seated break   TODAY'S TREATMENT:  11/17/22: NuStep L5/L4/L3 taper resistance over 10' due to Pt fatigue of bil UE Discussed increasing walking goals at home to 3x/day until feeling just slightly winded (about 1') Gait with SPC from ortho gym to cancer gym and back before seated break for SOB Leg press seat 9 90lb bil LE 2x20, 40lb single leg  1x15 each LE Hip matrix 2x10 bil LE hip abd/ext 25lb   11/12/22: NuStep L6 x 4', decreased to L5x 6', PT present to monitor Hip matrix 1x10 bil LE hip abd/ext 25lb Leg press seat 9 90lb bil LE 1x10, 1x16 Outdoor ambulation with SPC, with gait belt and close supervision/CGA: level surface, grassy surface, incline/decline, curbs, ramps  11/10/22: NuStep L5 x 10' PT present to monitor Standing march taps to 6" step alt LE 2x20 with bil rail Standing hip ext and abd 2x10 Stairs up/down x 3 rounds with bil rail x 2, single rail x1 (4x 6" steps) Standing bil heel raise x 20 Hip abd and ext x 10 each Gait belt and CGA: fwd step with UE reach x 5 each, sidestepping each way x 2 rounds 1' walk from ortho to cancer gym Hurdle forward step overs in parallel bars x 3 laps, step to pattern 1' walk from cancer gym to ortho gym    PATIENT EDUCATION:  Education details: Access Code: [email or web address] Person educated: Patient and Spouse Education method: Explanation, Demonstration, and Handouts Education comprehension: verbalized understanding and returned demonstration   HOME EXERCISE PROGRAM: Access Code: [email or web address] URL: [email or web address] Date: 09/30/2022 Prepared by: Venetia Night Deegan Valentino  Exercises - Seated Long Arc Quad  - 1 x daily - 7 x weekly - 1 sets - 10 reps - 5 hold - Standing Hip Abduction with Counter Support  - 1 x daily - 7 x weekly - 1 sets - 10 reps - Standing Hip Extension with Counter Support  - 1 x daily - 7 x weekly -  1 sets - 10 reps - Heel Raises with Counter Support  - 1 x daily - 7 x weekly - 1 sets - 10 reps - Seated Hip Adduction Isometrics with Ball  - 1 x daily - 7 x weekly - 1 sets - 10 reps - Seated March  - 1 x daily - 7 x weekly - 1 sets - 10 reps - Ankle Pumps in Elevation  - 1 x daily - 7 x weekly - 1 sets - 10 reps - Forward Step Up  - 3 x daily - 7 x weekly - 2 sets - 10 reps - Standing Forward Toe Taps on Box (BKA)  - 3 x daily - 7 x weekly - 2 sets -  10 reps - Sit to Stand Without Arm Support  - 3 x daily - 7 x weekly - 1 sets - 10 reps - Standing Row with Anchored Resistance  - 1 x daily - 7 x weekly - 2 sets - 10 reps - Shoulder extension with resistance - Neutral  - 1 x daily - 7 x weekly - 2 sets - 10 reps   ASSESSMENT:   CLINICAL IMPRESSION: Pt was able to increase reps on LE machines today.  He also demo'd greatest endurance and stamina with gait today walking with SPC from ortho gym to cancer gym and back before needing seated break and with less SOB.  PT encouraged him to walk 3x/day at home until lightly winded to keep improving gait endurance.     OBJECTIVE IMPAIRMENTS: Abnormal gait, cardiopulmonary status limiting activity, decreased activity tolerance, decreased balance, decreased coordination, decreased endurance, decreased knowledge of condition, decreased mobility, difficulty walking, decreased ROM, decreased strength, decreased safety awareness, hypomobility, increased edema, impaired flexibility, improper body mechanics, and postural dysfunction.    ACTIVITY LIMITATIONS: carrying, lifting, bending, standing, squatting, stairs, transfers, dressing, and locomotion level   PARTICIPATION LIMITATIONS: meal prep, cleaning, laundry, shopping, community activity, and yard work   PERSONAL FACTORS: Age, Time since onset of injury/illness/exacerbation, and 3+ comorbidities: + cardiac history, hydrocephalus with shunt, LE edema, Rt LE weakness with Hx of THA and TKA  are also affecting patient's functional outcome.    REHAB POTENTIAL: Good   CLINICAL DECISION MAKING: Stable/uncomplicated   EVALUATION COMPLEXITY: Low     GOALS: Goals reviewed with patient? Yes   SHORT TERM GOALS: Target date: 09/24/22   Pt will be ind with initial HEP Baseline: Goal status: met   2.  Pt will improve 5X sit to stand to </= 15 sec with min use of UE Baseline: 17 sec with mod/max use of UE Goal status: met, 14 sec   3.  Pt will demo  improved quad and lateral hip muscle activations on Rt with stance phase of gait. Baseline:  Goal status: met   4.  Improve TUG to 19 sec or less. Baseline: 22 sec Goal status: met, 16 sec      LONG TERM GOALS: Target date: 10/22/22   Pt will be ind with advanced HEP to maintain gains from PT. Baseline:  Goal status: ongoing   2.  Pt will improve TUG to 17 sec or less to demo improved efficiency and safety with functional mobility. Baseline:  Goal status: met, 16 sec   3.  Improve 5X sit to stand to 13 sec or less with min or no use of UE to demo reduced fall risk Baseline: 17 sec Goal status: ongoing, 14 sec   4.  Pt will demo improved  gait endurance to allow for a 3 min walk test. Baseline: covered 350' on 10/20/22 Goal status: needed break at 2:00 for SOB   5.  Pt will improve LE strength to at least 4+/5 on Rt for improved gait safety, transfers and stairs Baseline: 4/5 Goal status: INITIAL   6.  Improve BERG Balance Test to at least 44/52 to demo improved static and dynamic balance to reduce fall risk. Baseline: 39/52 Goal status: met on 10/13/22   PLAN:   PT FREQUENCY: 2x/week   PT DURATION: 8 weeks   PLANNED INTERVENTIONS: Therapeutic exercises, Therapeutic activity, Neuromuscular re-education, Balance training, Gait training, Patient/Family education, Self Care, Joint mobilization, Stair training, DME instructions, Electrical stimulation, Spinal mobilization, Cryotherapy, Moist heat, and Manual therapy.   PLAN FOR NEXT SESSION: NuStep, hip matrix and leg press, repeat 3MWT, outdoor ambulation with cane, car transfers, continue balance, functional strength , postural strength, add hip mobility and LE stretching focus for car transfers  Rashia Mckesson, PT 11/17/22 1:20 PM

## 2022-11-17 NOTE — Progress Notes (Signed)
[name] ([id number]) [id number] Page 1 of 11 Visit Report for 11/16/2022 Chief Complaint Document Details Patient Name: Date of Service: [name] 11/16/2022 2:00 PM Medical Record Number: [id number] Patient Account Number: [id number] Date of Birth/Sex: Treating RN: 1936-08-27 (87 y.o. M) Primary Care Provider: [name] Other Clinician: Referring Provider: Treating Provider/Extender: Heloise [name] in Treatment: 13 Information Obtained from: Patient Chief Complaint Left lower extremity wound Electronic Signature(s) Signed: 11/16/2022 2:34:42 PM By: [name] Maudlin MD FACS Entered By: [name] on 11/16/2022 14:34:42 -------------------------------------------------------------------------------- Debridement Details Patient Name: Date of Service: [name] 11/16/2022 2:00 PM Medical Record Number: [id number] Patient Account Number: [id number] Date of Birth/Sex: Treating RN: [name] in Treatment: 13 Debridement Performed for Assessment: Wound #4 Left,Anterior Lower Leg Performed By: Physician [name] Maudlin, MD Debridement Type: Debridement Level of Consciousness (Pre-procedure): Awake and Alert Pre-procedure Verification/Time Out Yes - 14:07 Taken: Start Time: 14:07 Pain Control: Lidocaine 5% topical ointment T Area Debrided (L x W): otal 4 (cm) x 3.5 (cm) = 14 (cm) Tissue and other material debrided: Non-Viable, Eschar Level: Non-Viable Tissue Debridement Description: Selective/Open Wound Instrument: Curette Bleeding: Minimum Hemostasis Achieved: Pressure Response to Treatment: Procedure was tolerated well Level of Consciousness (Post- Awake and Alert procedure): Post Debridement Measurements of Total Wound Length: (cm)  0.1 Width: (cm) 0.1 Depth: (cm) 0.1 Volume: (cm) 0.001 Character of Wound/Ulcer Post Debridement: Improved Post Procedure Diagnosis Same as Pre-procedure Notes scribed for Dr. Celine Ahr by [name] Peals, RN Electronic Signature(s) Signed: 11/16/2022 4:48:14 PM By: [name] Signed: 11/16/2022 5:09:54 PM By: [name] Maudlin MD FACS Entered By: [name] on 11/16/2022 14:12:57 [place], [name] ([id number]) [id number] Page 2 of 11 -------------------------------------------------------------------------------- Debridement Details Patient Name: Date of Service: [name] 11/16/2022 2:00 PM Medical Record Number: [id number] Patient Account Number: [id number] Date of Birth/Sex: Treating RN: 1936/08/14 (87 y.o. [name] Primary Care Provider: [name] Other Clinician: Referring Provider: Treating Provider/Extender: Heloise [name] in Treatment: 13 Debridement Performed for Assessment: Wound #3 Left,Posterior Lower Leg Performed By: Physician [name] Maudlin, MD Debridement Type: Debridement Level of Consciousness (Pre-procedure): Awake and Alert Pre-procedure Verification/Time Out Yes - 14:07 Taken: Start Time: 14:07 Pain Control: Lidocaine 5% topical ointment T Area Debrided (L x W): otal 2 (cm) x 1.5 (cm) = 3 (cm) Tissue and other material debrided: Non-Viable, Eschar, Slough, Subcutaneous, Slough Level: Skin/Subcutaneous Tissue Debridement Description: Excisional Instrument: Curette Bleeding: Minimum Hemostasis Achieved: Pressure Response to Treatment: Procedure was tolerated well Level of Consciousness (Post- Awake and Alert procedure): Post Debridement Measurements of Total Wound Length: (cm) 2 Width: (cm) 1.5 Depth: (cm) 0.2 Volume: (cm) 0.471 Character of Wound/Ulcer Post Debridement: Improved Post Procedure Diagnosis Same as Pre-procedure Notes scribed for Dr. Celine Ahr by [name] Peals, RN Electronic Signature(s) Signed: 11/16/2022 4:48:14 PM By: [name] Signed: 11/16/2022 5:09:54 PM By: [name] Maudlin MD FACS Entered By: [name] on 11/16/2022 14:15:11 -------------------------------------------------------------------------------- HPI Details Patient Name: Date of Service: [name] 11/16/2022 2:00 PM Medical Record Number: [id number] Patient Account Number: [id number] Date of Birth/Sex: Treating RN: [birth year] (87 y.o. M) Primary Care Provider: [name] Other Clinician: Referring Provider: Treating Provider/Extender: Heloise [name] in Treatment: 13 History of Present Illness HPI Description: [name] is an 87 year old male with a past medical history of CAD s/p DES  to LCx 2006, carotid artery disease, hypertension, hyperlipidemia, paroxysmal atrial fibrillation on Eliquis and Cardizem that presents today for right lower extremity wound. He was seen by Dr. Larose Kells, his primary care provider on 4/29 for this issue and was referred to our clinic. Patient states that he had a small wound that spontaneously started in October 2021 and has not healed. He states this has progressively gotten larger. He has been using acne cream prescribed by the dermatologist for this issue. He reports drainage to the wound but no purulent drainage. He reports mild soreness to the wound. He has swelling in his right leg greater than left and reports this is a chronic issue for the past 1 to 2 years. He denies resting leg pain or pain with ambulation. Of note He was prescribed doxycycline at the end of last month and has finished this course for possible skin infection to the wound site. 02/06/2021; I am seeing this patient who was admitted to the clinic last week by Dr. Heber [place]. He has predominantly I think a venous insufficiency wound on the right medial lower leg he has been using Santyl Hydrofera Blue under 3 layer compression. Arterial  studies were ordered last week but do not seem to been put through. He is not a diabetic. He did have venous reflux studies done in August 2020. He did have abnormal reflux time was noted in the great saphenous vein in the distal thigh and the great saphenous vein at the knee. There was no evidence of DVT or SVT at the time he was not felt to have large enough saphenous veins on either side for intervention. Compression stockings at least knee-high were recommended. Follow-up was on a as needed basis with Dr. Kendrick [place], [place] [name] ([id number]) [id number] Page 3 of 11 The patient does not describe claudication but his pulses in his feet are not vibrant this could be because of the swelling 5/26; patient presents for 1 week follow-up. He has been using Hydrofera Blue under 3 layer compression. He had arterial studies done. He has no issues or complaints today. He denies signs of infection. He has his juxta light compressions with him today. 6/3; patient presents for 1 week follow-up. He has been using Hydrofera Blue under 3 layer compression. He denies signs and symptoms of infection. He did have bright green drainage which he states he has not seen before when the dressing was taken off today. He is using his juxta light compression to the left leg. 6/10; patient presents for 1 week follow-up. He has been tolerating Hydrofera Blue with 3 layer compression. He again reports bright green drainage. However he denies any signs of infection. He has been using juxta light compression to the left leg. 6/24; patient presents for 2-week follow-up. He has been tolerating Hydrofera Blue with 3 layer compression. Today he is healed. He brought his juxta lite compression for the right leg and continues to wear the juxta light compression on the left leg READMISSION 05/05/2022 The patient returns to clinic today with a new wound on his left posterior leg. He has been wearing his juxta  lite stockings on a regular basis, but on exam he still has 3+ pitting edema. I suspect he has lymphedema in addition to his known venous reflux. The wounds are scattered geographic wounds with light slough accumulation. They have been applying mupirocin and CeraVe ointment. He does have a VP shunt placement scheduled for next week, but it sounds like his neurosurgeon is not aware  of the fact that he has an open wound. 05/11/2022: His VP shunt placement has been postponed while we get his wound to heal. The wounds are all smaller today with just a little eschar and minimal slough. Edema control is good. No concern for infection. 05/18/2022: All of the open areas have contracted and there is good perimeter epithelialization. There is a little bit of eschar and slough on the open areas. Good edema control. No concern for infection. 05/26/2022: Many of the small open areas have closed. There is Hydrofera Blue sponge stuck in a number of places and this is unable to be removed by the intake nurse. There is a fair amount of eschar and a little bit of slough present. 06/02/2022: Continued closure of small open skin sites. There are just a couple remaining and these have a little eschar on the surface. Edema control is excellent. 06/09/2022: We are down to just 1 small open area. There is some eschar and slough present. No concern for infection. Edema control is excellent. 06/16/2022: His wound is healed. READMISSION 08/11/2022 Mr. Short underwent a successful VP shunt placement. He returns to clinic today because of a suspicious lesion on his left posterior calf. He actually has no open wounds and his edema control is excellent. He is wearing his juxta lite stockings religiously. On his left posterior calf, there is a raised scaly lesion concerning for potential squamous cell carcinoma. 08/25/2022: The biopsy that I took at his last visit was consistent with a well-differentiated squamous cell carcinoma. He has  an appointment at the skin surgery center on December 14. The wound from the biopsy has some slough accumulation, but no concern for infection. 11/06/2022: Since his last visit here, he has undergone Mohs surgery for the squamous cell carcinoma that I biopsied. He has a fairly substantial and deep defect. Apparently the procedure took place between 10 to 12 days ago and he has been in an [employer] ever since. When his Unna boot was removed, he was found to have crusting on his anterior tibial surface with small superficial wounds underneath. 11/16/2022: The anterior tibial surface wounds seem to largely be closed with just a couple of tiny open areas under some eschar. The Mohs defect is quite a bit cleaner, but still with nonviable subcutaneous tissue present along with slough and eschar, much of which is dried up Iodoflex. Electronic Signature(s) Signed: 11/16/2022 2:35:47 PM By: [name] Maudlin MD FACS Entered By: [name] on 11/16/2022 14:35:47 -------------------------------------------------------------------------------- Physical Exam Details Patient Name: Date of Service: [name] 11/16/2022 2:00 PM Medical Record Number: [id number] Patient Account Number: [id number] Date of Birth/Sex: Treating RN: 1935-10-13 (87 y.o. M) Primary Care Provider: [name] Other Clinician: Referring Provider: Treating Provider/Extender: [name] in Treatment: 13 Constitutional Hypertensive, asymptomatic. . . . no acute distress. Respiratory Normal work of breathing on room air. Notes 11/16/2022: The anterior tibial surface wounds seem to largely be closed with just a couple of tiny open areas under some eschar. The Mohs defect is quite a bit cleaner, but still with nonviable subcutaneous tissue present along with slough and eschar, much of which is dried up Iodoflex. Electronic Signature(s) [name], [name] ([id number]) [id number] Page  4 of 11 Signed: 11/16/2022 2:36:26 PM By: [name] Maudlin MD FACS Entered By: [name] on 11/16/2022 14:36:25 -------------------------------------------------------------------------------- Physician Orders Details Patient Name: Date of Service: [name] 11/16/2022 2:00 PM Medical Record Number: [id number] Patient Account Number: [id number] Date  of Birth/Sex: Treating RN: 03-Sep-1936 (87 y.o. [name] Primary Care Provider: [name] Other Clinician: Referring Provider: Treating Provider/Extender: Heloise [name] in Treatment: 13 Verbal / Phone Orders: No Diagnosis Coding ICD-10 Coding Code Description [phone] Non-pressure chronic ulcer of left calf with fat layer exposed L97.821 Non-pressure chronic ulcer of other part of left lower leg limited to breakdown of skin L98.9 Disorder of the skin and subcutaneous tissue, unspecified I87.2 Venous insufficiency (chronic) (peripheral) I10 Essential (primary) hypertension Follow-up Appointments ppointment in 1 week. - Dr. Celine Ahr - room 2 Return A Anesthetic (In clinic) Topical Lidocaine 5% applied to wound bed Bathing/ Shower/ Hygiene May shower with protection but do not get wound dressing(s) wet. Protect dressing(s) with water repellant cover (for example, large plastic bag) or a cast cover and may then take shower. Edema Control - Lymphedema / SCD / Other Elevate legs to the level of the heart or above for 30 minutes daily and/or when sitting for 3-4 times a day throughout the day. Avoid standing for long periods of time. Exercise regularly - including ankle circles while sitting Moisturize legs daily. - both legs nightly after removing juxtalites Compression stocking or Garment 20-30 mm/Hg pressure to: - both legs daily Wound Treatment Wound #3 - Lower Leg Wound Laterality: Left, Posterior Cleanser: Soap and Water 1 x Per Day/30 Days Discharge Instructions: May shower and wash wound  with dial antibacterial soap and water prior to dressing change. Cleanser: Wound Cleanser 1 x Per Day/30 Days Discharge Instructions: Cleanse the wound with wound cleanser prior to applying a clean dressing using gauze sponges, not tissue or cotton balls. Peri-Wound Care: Sween Lotion (Moisturizing lotion) 1 x Per Day/30 Days Discharge Instructions: Apply moisturizing lotion as directed Prim Dressing: IODOFLEX 0.9% Cadexomer Iodine Pad 4x6 cm 1 x Per Day/30 Days ary Discharge Instructions: Apply to wound bed as instructed Secondary Dressing: ABD Pad, 5x9 1 x Per Day/30 Days Discharge Instructions: Apply over primary dressing as directed. Secondary Dressing: Woven Gauze Sponge, Non-Sterile 4x4 in 1 x Per Day/30 Days Discharge Instructions: Apply over primary dressing as directed. Compression Wrap: ThreePress (3 layer compression wrap) 1 x Per Day/30 Days Discharge Instructions: Apply three layer compression as directed. Wound #4 - Lower Leg Wound Laterality: Left, Anterior Cleanser: Soap and Water 1 x Per Day/30 Days Discharge Instructions: May shower and wash wound with dial antibacterial soap and water prior to dressing change. Cleanser: Wound Cleanser 1 x Per Day/30 Days Discharge Instructions: Cleanse the wound with wound cleanser prior to applying a clean dressing using gauze sponges, not tissue or cotton balls. Peri-Wound Care: Sween Lotion (Moisturizing lotion) 1 x Per Day/30 Days Discharge Instructions: Apply moisturizing lotion as directed [name], [name] ([id number]) [id number].pdf Page 5 of 11 Prim Dressing: Sorbalgon AG Dressing 2x2 (in/in) 1 x Per Day/30 Days ary Discharge Instructions: Apply to wound bed as instructed Secondary Dressing: ABD Pad, 5x9 1 x Per Day/30 Days Discharge Instructions: Apply over primary dressing as directed. Secondary Dressing: Woven Gauze Sponge, Non-Sterile 4x4 in 1 x Per Day/30 Days Discharge Instructions: Apply over  primary dressing as directed. Compression Wrap: ThreePress (3 layer compression wrap) 1 x Per Day/30 Days Discharge Instructions: Apply three layer compression as directed. Patient Medications llergies: No Known Allergies A Notifications Medication Indication Start End 11/16/2022 lidocaine DOSE topical 5 % ointment - ointment topical Electronic Signature(s) Signed: 11/16/2022 5:09:54 PM By: [name] Maudlin MD FACS Entered By: [name] on 11/16/2022 14:36:41 -------------------------------------------------------------------------------- Problem List Details Patient Name:  Date of Service: [name] 11/16/2022 2:00 PM Medical Record Number: [id number] Patient Account Number: [id number] Date of Birth/Sex: Treating RN: 06-01-1936 (87 y.o. M) Primary Care Provider: [name] Other Clinician: Referring Provider: Treating Provider/Extender: Heloise [name] in Treatment: 13 Active Problems ICD-10 Encounter Code Description Active Date MDM Diagnosis L97.222 Non-pressure chronic ulcer of left calf with fat layer exposed 11/06/2022 No Yes L97.821 Non-pressure chronic ulcer of other part of left lower leg limited to breakdown 11/06/2022 No Yes of skin L98.9 Disorder of the skin and subcutaneous tissue, unspecified 08/11/2022 No Yes I87.2 Venous insufficiency (chronic) (peripheral) 08/11/2022 No Yes I10 Essential (primary) hypertension 08/11/2022 No Yes Inactive Problems Resolved Problems Electronic Signature(s) Signed: 11/16/2022 2:34:22 PM By: [name] Maudlin MD [place], Wallace Keller ([id number]) [id number] Page 6 of 11 Entered By: [name] on 11/16/2022 14:34:22 -------------------------------------------------------------------------------- Progress Note Details Patient Name: Date of Service: [name] 11/16/2022 2:00 PM Medical Record Number: [id number] Patient Account Number: [id number] Date of Birth/Sex:  Treating RN: 02/28/1936 (87 y.o. M) Primary Care Provider: [name] Other Clinician: Referring Provider: Treating Provider/Extender: Heloise [name] in Treatment: 13 Subjective Chief Complaint Information obtained from Patient Left lower extremity wound History of Present Illness (HPI) [name] is an 87 year old male with a past medical history of CAD s/p DES to LCx 2006, carotid artery disease, hypertension, hyperlipidemia, paroxysmal atrial fibrillation on Eliquis and Cardizem that presents today for right lower extremity wound. He was seen by Dr. Larose Kells, his primary care provider on 4/29 for this issue and was referred to our clinic. Patient states that he had a small wound that spontaneously started in October 2021 and has not healed. He states this has progressively gotten larger. He has been using acne cream prescribed by the dermatologist for this issue. He reports drainage to the wound but no purulent drainage. He reports mild soreness to the wound. He has swelling in his right leg greater than left and reports this is a chronic issue for the past 1 to 2 years. He denies resting leg pain or pain with ambulation. Of note He was prescribed doxycycline at the end of last month and has finished this course for possible skin infection to the wound site. 02/06/2021; I am seeing this patient who was admitted to the clinic last week by Dr. Heber Cana. He has predominantly I think a venous insufficiency wound on the right medial lower leg he has been using Santyl Hydrofera Blue under 3 layer compression. Arterial studies were ordered last week but do not seem to been put through. He is not a diabetic. He did have venous reflux studies done in August 2020. He did have abnormal reflux time was noted in the great saphenous vein in the distal thigh and the great saphenous vein at the knee. There was no evidence of DVT or SVT at the time he was not felt to have large enough saphenous  veins on either side for intervention. Compression stockings at least knee-high were recommended. Follow-up was on a as needed basis with Dr. Donzetta Matters The patient does not describe claudication but his pulses in his feet are not vibrant this could be because of the swelling 5/26; patient presents for 1 week follow-up. He has been using Hydrofera Blue under 3 layer compression. He had arterial studies done. He has no issues or complaints today. He denies signs of infection. He has his juxta light compressions with him today. 6/3; patient presents for  1 week follow-up. He has been using Hydrofera Blue under 3 layer compression. He denies signs and symptoms of infection. He did have bright green drainage which he states he has not seen before when the dressing was taken off today. He is using his juxta light compression to the left leg. 6/10; patient presents for 1 week follow-up. He has been tolerating Hydrofera Blue with 3 layer compression. He again reports bright green drainage. However he denies any signs of infection. He has been using juxta light compression to the left leg. 6/24; patient presents for 2-week follow-up. He has been tolerating Hydrofera Blue with 3 layer compression. Today he is healed. He brought his juxta lite compression for the right leg and continues to wear the juxta light compression on the left leg READMISSION 05/05/2022 The patient returns to clinic today with a new wound on his left posterior leg. He has been wearing his juxta lite stockings on a regular basis, but on exam he still has 3+ pitting edema. I suspect he has lymphedema in addition to his known venous reflux. The wounds are scattered geographic wounds with light slough accumulation. They have been applying mupirocin and CeraVe ointment. He does have a VP shunt placement scheduled for next week, but it sounds like his neurosurgeon is not aware of the fact that he has an open wound. 05/11/2022: His VP shunt placement  has been postponed while we get his wound to heal. The wounds are all smaller today with just a little eschar and minimal slough. Edema control is good. No concern for infection. 05/18/2022: All of the open areas have contracted and there is good perimeter epithelialization. There is a little bit of eschar and slough on the open areas. Good edema control. No concern for infection. 05/26/2022: Many of the small open areas have closed. There is Hydrofera Blue sponge stuck in a number of places and this is unable to be removed by the intake nurse. There is a fair amount of eschar and a little bit of slough present. 06/02/2022: Continued closure of small open skin sites. There are just a couple remaining and these have a little eschar on the surface. Edema control is excellent. 06/09/2022: We are down to just 1 small open area. There is some eschar and slough present. No concern for infection. Edema control is excellent. 06/16/2022: His wound is healed. READMISSION 08/11/2022 Mr. Short underwent a successful VP shunt placement. He returns to clinic today because of a suspicious lesion on his left posterior calf. He actually has no open wounds and his edema control is excellent. He is wearing his juxta lite stockings religiously. On his left posterior calf, there is a raised scaly lesion concerning for potential squamous cell carcinoma. 08/25/2022: The biopsy that I took at his last visit was consistent with a well-differentiated squamous cell carcinoma. He has an appointment at the skin surgery center on December 14. The wound from the biopsy has some slough accumulation, but no concern for infection. 11/06/2022: Since his last visit here, he has undergone Mohs surgery for the squamous cell carcinoma that I biopsied. He has a fairly substantial and deep defect. Apparently the procedure took place between 10 to 12 days ago and he has been in an [employer] ever since. When his [employer] was removed, he AVISH, CONSIDINE ([id number]) [phone].pdf Page 7 of 11 was found to have crusting on his anterior tibial surface with small superficial wounds underneath. 11/16/2022: The anterior tibial surface wounds seem to  largely be closed with just a couple of tiny open areas under some eschar. The Mohs defect is quite a bit cleaner, but still with nonviable subcutaneous tissue present along with slough and eschar, much of which is dried up Iodoflex. Patient History Information obtained from Patient. Family History Cancer - Father, Diabetes - Maternal Grandparents, Heart Disease - Maternal Grandparents, Hypertension - Maternal Grandparents, No family history of Hereditary Spherocytosis, Kidney Disease, Lung Disease, Seizures, Stroke, Thyroid Problems, Tuberculosis. Social History Former smoker - Quit over 30 years ago, Marital Status - Married, Alcohol Use - Rarely, Drug Use - No History, Caffeine Use - Daily. Medical History Eyes Patient has history of Glaucoma Cardiovascular Patient has history of Arrhythmia - afib, Coronary Artery Disease, Hypertension, Peripheral Venous Disease Endocrine Denies history of Type I Diabetes, Type II Diabetes Genitourinary Denies history of End Stage Renal Disease Integumentary (Skin) Denies history of History of Burn Musculoskeletal Patient has history of Osteoarthritis Oncologic Denies history of Received Chemotherapy, Received Radiation Psychiatric Denies history of Anorexia/bulimia, Confinement Anxiety Hospitalization/Surgery History - lumbar laminectomy. - total knee arthropathy right. - bil total hip arthroplasty. - coronary stents. - toe surgery. - tonsillectomy. - shunt insertion. Medical A Surgical History Notes nd Gastrointestinal colon polyps Endocrine Hypothyroidism Genitourinary BPH Musculoskeletal Bilat Hip Replacements, Right Knee Replacement, lumbar stenosis Neurologic hydrocephalus Oncologic Skin Cancer multiple  sites Objective Constitutional Hypertensive, asymptomatic. no acute distress. Vitals Time Taken: 1:54 PM, Temperature: 97.7 F, Pulse: 76 bpm, Respiratory Rate: 16 breaths/min, Blood Pressure: 155/73 mmHg. Respiratory Normal work of breathing on room air. General Notes: 11/16/2022: The anterior tibial surface wounds seem to largely be closed with just a couple of tiny open areas under some eschar. The Mohs defect is quite a bit cleaner, but still with nonviable subcutaneous tissue present along with slough and eschar, much of which is dried up Iodoflex. Integumentary (Hair, Skin) Wound #3 status is Open. Original cause of wound was Puncture. The date acquired was: 08/12/2022. The wound has been in treatment 11 weeks. The wound is located on the Left,Posterior Lower Leg. The wound measures 2cm length x 1.5cm width x 0.2cm depth; 2.356cm^2 area and 0.471cm^3 volume. There is Fat Layer (Subcutaneous Tissue) exposed. There is no tunneling or undermining noted. There is a medium amount of serosanguineous drainage noted. The wound margin is distinct with the outline attached to the wound base. There is large (67-100%) red granulation within the wound bed. There is a small (1-33%) amount of necrotic tissue within the wound bed including Eschar and Adherent Slough. The periwound skin appearance had no abnormalities noted for texture. The periwound skin appearance had no abnormalities noted for moisture. The periwound skin appearance exhibited: Hemosiderin Staining. Periwound temperature was noted as No Abnormality. Wound #4 status is Open. Original cause of wound was Gradually Appeared. The date acquired was: 11/06/2022. The wound has been in treatment 1 weeks. The wound is located on the Left,Anterior Lower Leg. The wound measures 0.1cm length x 0.1cm width x 0.1cm depth; 0.008cm^2 area and 0.001cm^3 volume. There is no tunneling or undermining noted. There is a none present amount of drainage noted. The  wound margin is distinct with the outline attached to the wound base. There is no granulation within the wound bed. There is no necrotic tissue within the wound bed. The periwound skin appearance had no abnormalities noted for color. The periwound skin appearance exhibited: Scarring, Dry/Scaly. Periwound temperature was noted as No Abnormality. [name], [name] ([id number]) [id number] Page 8 of 11 Assessment Active  Problems ICD-10 Non-pressure chronic ulcer of left calf with fat layer exposed Non-pressure chronic ulcer of other part of left lower leg limited to breakdown of skin Disorder of the skin and subcutaneous tissue, unspecified Venous insufficiency (chronic) (peripheral) Essential (primary) hypertension Procedures Wound #3 Pre-procedure diagnosis of Wound #3 is a Lesion located on the Left,Posterior Lower Leg . There was a Excisional Skin/Subcutaneous Tissue Debridement with a total area of 3 sq cm performed by [name] Maudlin, MD. With the following instrument(s): Curette to remove Non-Viable tissue/material. Material removed includes Eschar, Subcutaneous Tissue, and Slough after achieving pain control using Lidocaine 5% topical ointment. No specimens were taken. A time out was conducted at 14:07, prior to the start of the procedure. A Minimum amount of bleeding was controlled with Pressure. The procedure was tolerated well. Post Debridement Measurements: 2cm length x 1.5cm width x 0.2cm depth; 0.471cm^3 volume. Character of Wound/Ulcer Post Debridement is improved. Post procedure Diagnosis Wound #3: Same as Pre-Procedure General Notes: scribed for Dr. Celine Ahr by [name] Peals, RN. Pre-procedure diagnosis of Wound #3 is a Lesion located on the Left,Posterior Lower Leg . There was a Three Layer Compression Therapy Procedure by [name] Peals, RN. Post procedure Diagnosis Wound #3: Same as Pre-Procedure Wound #4 Pre-procedure diagnosis of Wound #4  is a T be determined located on the Left,Anterior Lower Leg . There was a Selective/Open Wound Non-Viable Tissue o Debridement with a total area of 14 sq cm performed by [name] Maudlin, MD. With the following instrument(s): Curette to remove Non-Viable tissue/material. Material removed includes Eschar after achieving pain control using Lidocaine 5% topical ointment. No specimens were taken. A time out was conducted at 14:07, prior to the start of the procedure. A Minimum amount of bleeding was controlled with Pressure. The procedure was tolerated well. Post Debridement Measurements: 0.1cm length x 0.1cm width x 0.1cm depth; 0.001cm^3 volume. Character of Wound/Ulcer Post Debridement is improved. Post procedure Diagnosis Wound #4: Same as Pre-Procedure General Notes: scribed for Dr. Celine Ahr by [name] Peals, RN. Plan Follow-up Appointments: Return Appointment in 1 week. - Dr. Celine Ahr - room 2 Anesthetic: (In clinic) Topical Lidocaine 5% applied to wound bed Bathing/ Shower/ Hygiene: May shower with protection but do not get wound dressing(s) wet. Protect dressing(s) with water repellant cover (for example, large plastic bag) or a cast cover and may then take shower. Edema Control - Lymphedema / SCD / Other: Elevate legs to the level of the heart or above for 30 minutes daily and/or when sitting for 3-4 times a day throughout the day. Avoid standing for long periods of time. Exercise regularly - including ankle circles while sitting Moisturize legs daily. - both legs nightly after removing juxtalites Compression stocking or Garment 20-30 mm/Hg pressure to: - both legs daily The following medication(s) was prescribed: lidocaine topical 5 % ointment ointment topical was prescribed at facility WOUND #3: - Lower Leg Wound Laterality: Left, Posterior Cleanser: Soap and Water 1 x Per Day/30 Days Discharge Instructions: May shower and wash wound with dial antibacterial soap and water prior to  dressing change. Cleanser: Wound Cleanser 1 x Per Day/30 Days Discharge Instructions: Cleanse the wound with wound cleanser prior to applying a clean dressing using gauze sponges, not tissue or cotton balls. Peri-Wound Care: Sween Lotion (Moisturizing lotion) 1 x Per Day/30 Days Discharge Instructions: Apply moisturizing lotion as directed Prim Dressing: IODOFLEX 0.9% Cadexomer Iodine Pad 4x6 cm 1 x Per Day/30 Days ary Discharge Instructions: Apply to wound bed as instructed Secondary Dressing: ABD Pad,  5x9 1 x Per Day/30 Days Discharge Instructions: Apply over primary dressing as directed. Secondary Dressing: Woven Gauze Sponge, Non-Sterile 4x4 in 1 x Per Day/30 Days Discharge Instructions: Apply over primary dressing as directed. Com pression Wrap: ThreePress (3 layer compression wrap) 1 x Per Day/30 Days Discharge Instructions: Apply three layer compression as directed. WOUND #4: - Lower Leg Wound Laterality: Left, Anterior Cleanser: Soap and Water 1 x Per Day/30 Days Discharge Instructions: May shower and wash wound with dial antibacterial soap and water prior to dressing change. Cleanser: Wound Cleanser 1 x Per Day/30 Days Discharge Instructions: Cleanse the wound with wound cleanser prior to applying a clean dressing using gauze sponges, not tissue or cotton balls. Peri-Wound Care: Sween Lotion (Moisturizing lotion) 1 x Per Day/30 Days Discharge Instructions: Apply moisturizing lotion as directed [name], [name] ([id number]) [phone].pdf Page 9 of 11 Prim Dressing: Sorbalgon [employer] Dressing 2x2 (in/in) 1 x Per Day/30 Days ary Discharge Instructions: Apply to wound bed as instructed Secondary Dressing: ABD Pad, 5x9 1 x Per Day/30 Days Discharge Instructions: Apply over primary dressing as directed. Secondary Dressing: Woven Gauze Sponge, Non-Sterile 4x4 in 1 x Per Day/30 Days Discharge Instructions: Apply over primary dressing as directed. Com pression Wrap:  ThreePress (3 layer compression wrap) 1 x Per Day/30 Days Discharge Instructions: Apply three layer compression as directed. 11/16/2022: The anterior tibial surface wounds seem to largely be closed with just a couple of tiny open areas under some eschar. The Mohs defect is quite a bit cleaner, but still with nonviable subcutaneous tissue present along with slough and eschar, much of which is dried up Iodoflex. I used a curette to debride eschar off of the anterior tibial surface and slough, nonviable subcutaneous tissue, and eschar from the Mohs defect site. I think this wound will continue to benefit from chemical debridement during the week so we will continue Iodoflex here. Continue silver alginate to the anterior tibial surface. Continue 3 layer compression. Follow-up in 1 week. Electronic Signature(s) Signed: 11/16/2022 2:37:28 PM By: [name] Maudlin MD FACS Entered By: [name] on 11/16/2022 14:37:28 -------------------------------------------------------------------------------- HxROS Details Patient Name: Date of Service: [name] 11/16/2022 2:00 PM Medical Record Number: [id number] Patient Account Number: [id number] Date of Birth/Sex: Treating RN: 1935-12-05 (87 y.o. M) Primary Care Provider: [name] Other Clinician: Referring Provider: Treating Provider/Extender: Heloise [name] in Treatment: 13 Information Obtained From Patient Eyes Medical History: Positive for: Glaucoma Cardiovascular Medical History: Positive for: Arrhythmia - afib; Coronary Artery Disease; Hypertension; Peripheral Venous Disease Gastrointestinal Medical History: Past Medical History Notes: colon polyps Endocrine Medical History: Negative for: Type I Diabetes; Type II Diabetes Past Medical History Notes: Hypothyroidism Genitourinary Medical History: Negative for: End Stage Renal Disease Past Medical History Notes: BPH Integumentary (Skin) Medical  History: Negative for: History of Burn Musculoskeletal Medical History: Positive for: Osteoarthritis Past Medical History NotesRAINI, [name] ([id number]) [id number] Page 10 of 11 Bilat Hip Replacements, Right Knee Replacement, lumbar stenosis Neurologic Medical History: Past Medical History Notes: hydrocephalus Oncologic Medical History: Negative for: Received Chemotherapy; Received Radiation Past Medical History Notes: Skin Cancer multiple sites Psychiatric Medical History: Negative for: Anorexia/bulimia; Confinement Anxiety HBO Extended History Items Eyes: Glaucoma Immunizations Pneumococcal Vaccine: Received Pneumococcal Vaccination: Yes Received Pneumococcal Vaccination On or After 60th Birthday: Yes Implantable Devices None Hospitalization / Surgery History Type of Hospitalization/Surgery lumbar laminectomy total knee arthropathy right bil total hip arthroplasty coronary stents toe surgery tonsillectomy shunt insertion Family and Social History Cancer: Yes -  Father; Diabetes: Yes - Maternal Grandparents; Heart Disease: Yes - Maternal Grandparents; Hereditary Spherocytosis: No; Hypertension: Yes - Maternal Grandparents; Kidney Disease: No; Lung Disease: No; Seizures: No; Stroke: No; Thyroid Problems: No; Tuberculosis: No; Former smoker - Quit over 30 years ago; Marital Status - Married; Alcohol Use: Rarely; Drug Use: No History; Caffeine Use: Daily; Financial Concerns: No; Food, Clothing or Shelter Needs: No; Support System Lacking: No; Transportation Concerns: No Engineer, maintenance) Signed: 11/16/2022 5:09:54 PM By: [name] Maudlin MD FACS Entered By: [name] on 11/16/2022 14:35:53 -------------------------------------------------------------------------------- SuperBill Details Patient Name: Date of Service: [name] 11/16/2022 Medical Record Number: [id number] Patient Account Number: [id number] Date of  Birth/Sex: Treating RN: May 11, 1936 (87 y.o. M) Primary Care Provider: [name] Other Clinician: Referring Provider: Treating Provider/Extender: [name] in Treatment: 13 Diagnosis Coding ICD-10 Codes Code Description [phone] Non-pressure chronic ulcer of left calf with fat layer exposed L97.821 Non-pressure chronic ulcer of other part of left lower leg limited to breakdown of skin L98.9 Disorder of the skin and subcutaneous tissue, unspecified I87.2 Venous insufficiency (chronic) (peripheral) [name], [name] ([id number]) [id number] Page 11 of [place] (primary) hypertension Facility Procedures : CPT4 Code: [id number] Description: [id number] - DEB SUBQ TISSUE 20 SQ CM/< ICD-10 Diagnosis Description L97.222 Non-pressure chronic ulcer of left calf with fat layer exposed Modifier: Quantity: 1 : CPT4 Code: [id number] Description: [id number] - DEBRIDE WOUND 1ST 20 SQ CM OR < ICD-10 Diagnosis Description L97.821 Non-pressure chronic ulcer of other part of left lower leg limited to breakdown o Modifier: f skin Quantity: 1 Physician Procedures : CPT4 Code Description Modifier [id number] - WC PHYS LEVEL 3 - EST PT 25 ICD-10 Diagnosis Description L97.222 Non-pressure chronic ulcer of left calf with fat layer exposed L97.821 Non-pressure chronic ulcer of other part of left lower leg limited to  breakdown of skin L98.9 Disorder of the skin and subcutaneous tissue, unspecified I87.2 Venous insufficiency (chronic) (peripheral) Quantity: 1 : [id number] - WC PHYS SUBQ TISS 20 SQ CM ICD-10 Diagnosis Description L97.222 Non-pressure chronic ulcer of left calf with fat layer exposed Quantity: 1 : [id number] - WC PHYS DEBR WO ANESTH 20 SQ CM ICD-10 Diagnosis Description L97.821 Non-pressure chronic ulcer of other part of left lower leg limited to breakdown of skin Quantity: 1 Electronic Signature(s) Signed: 11/16/2022 2:37:49 PM By: [name] Maudlin MD FACS Entered By: [name] on 11/16/2022 14:37:49

## 2022-11-17 NOTE — Progress Notes (Signed)
RAYDIN, SIMARD ([id number]) [id number] Page 1 of 9 Visit Report for 11/16/2022 Arrival Information Details Patient Name: Date of Service: [name], [name] 11/16/2022 2:00 PM Medical Record Number: [id number] Patient Account Number: [id number] Date of Birth/Sex: Treating RN: 1935/12/25 (87 y.o. [name] Primary Care Casanova Schurman: [name] Other Clinician: Referring Davie Sagona: Treating Melysa Schroyer/Extender: [name] in Treatment: 13 Visit Information History Since Last Visit Added or deleted any medications: No Patient Arrived: [name] Any new allergies or adverse reactions: No Arrival Time: 13:51 Had a fall or experienced change in No Accompanied By: wife activities of daily living that may affect Transfer Assistance: None risk of falls: Patient Identification Verified: Yes Signs or symptoms of abuse/neglect since last visito No Secondary Verification Process Completed: Yes Hospitalized since last visit: No Implantable device outside of the clinic excluding No cellular tissue based products placed in the center since last visit: Has Dressing in Place as Prescribed: Yes Has Compression in Place as Prescribed: Yes Pain Present Now: No Electronic Signature(s) Signed: 11/16/2022 4:48:14 PM By: [name] Entered By: [name] on 11/16/2022 13:54:36 -------------------------------------------------------------------------------- Compression Therapy Details Patient Name: Date of Service: [name], [name] 11/16/2022 2:00 PM Medical Record Number: [id number] Patient Account Number: [id number] Date of Birth/Sex: Treating RN: October 13, 1935 (87 y.o. [name] Primary Care Beula Joyner: [name] Other Clinician: Referring Casyn Becvar: Treating Rosezetta Balderston/Extender: [name] in Treatment: 13 Compression Therapy Performed for Wound Assessment: Wound #3 Left,Posterior Lower Leg Performed By:  Clinician [name], RN Compression Type: Three Layer Post Procedure Diagnosis Same as Pre-procedure Electronic Signature(s) Signed: 11/16/2022 4:48:14 PM By: [name] Entered By: [name] on 11/16/2022 14:07:27 -------------------------------------------------------------------------------- Encounter Discharge Information Details Patient Name: Date of Service: [name], [name] 11/16/2022 2:00 PM Medical Record Number: [id number] Patient Account Number: [id number] Date of Birth/Sex: Treating RN: [birth year] (87 y.o. [name] Primary Care Aneudy Champlain: [name] Other Clinician: Referring Davit Vassar: Treating Camika Marsico/Extender: [name] in Treatment: 13 Encounter Discharge Information Items Post Procedure Vitals Discharge Condition: Stable Temperature (F): 97.7 Ambulatory Status: Cane Pulse (bpm): 76 Discharge Destination: Home Respiratory Rate (breaths/min): 16 Transportation: Private Auto Blood Pressure (mmHg): 155/73 [name], [name] ([id number]) [id number] Page 2 of 9 Accompanied By: wife Schedule Follow-up Appointment: Yes Clinical Summary of Care: Patient Declined Electronic Signature(s) Signed: 11/16/2022 4:48:14 PM By: [name] By: [name] on 11/16/2022 14:12:31 -------------------------------------------------------------------------------- Lower Extremity Assessment Details Patient Name: Date of Service: [name], [name] 11/16/2022 2:00 PM Medical Record Number: [id number] Patient Account Number: [id number] Date of Birth/Sex: Treating RN: 15-Mar-1936 (87 y.o. [name] Primary Care Jerico Grisso: [name] Other Clinician: Referring Orbie Grupe: Treating Lecia Esperanza/Extender: [name] in Treatment: 13 Edema Assessment Assessed: [Left: No] [Right: No] [Left: Edema] [Right: :] Calf Left: Right: Point of Measurement: From Medial  Instep 41 cm Ankle Left: Right: Point of Measurement: From Medial Instep 21.6 cm Vascular Assessment Pulses: Dorsalis Pedis Palpable: [Left:Yes] Electronic Signature(s) Signed: 11/16/2022 4:48:14 PM By: [name] Entered By: [name] on 11/16/2022 14:01:15 -------------------------------------------------------------------------------- Multi Wound Chart Details Patient Name: Date of Service: [name], [name] 11/16/2022 2:00 PM Medical Record Number: [id number] Patient Account Number: [id number] Date of Birth/Sex: Treating RN: [birth year] (87 y.o. M) Primary Care Sehaj Kolden: [name] Other Clinician: Referring Olajuwon Fosdick: Treating Yazleemar Strassner/Extender: [name] in Treatment: 13 Vital Signs Height(in): Pulse(bpm): 76 Weight(lbs): Blood Pressure(mmHg): 155/73 Body Mass Index(BMI): Temperature(F): 97.7 Respiratory  Rate(breaths/min): 16 [3:Photos:] [N/A:N/A [id number] Page 3 of 9] Left, Posterior Lower Leg Left, Anterior Lower Leg N/A Wound Location: Puncture Gradually Appeared N/A Wounding Event: Lesion T be determined o N/A Primary Etiology: Glaucoma, Arrhythmia, Coronary Glaucoma, Arrhythmia, Coronary N/A Comorbid History: Artery Disease, Hypertension, Artery Disease, Hypertension, Peripheral Venous Disease, Peripheral Venous Disease, Osteoarthritis Osteoarthritis 08/12/2022 11/06/2022 N/A Date Acquired: 11 1 N/A Weeks of Treatment: Open Open N/A Wound Status: No No N/A Wound Recurrence: 2x1.5x0.2 0.1x0.1x0.1 N/A Measurements L x W x D (cm) 2.356 0.008 N/A A (cm) : rea 0.471 0.001 N/A Volume (cm) : -3218.30% 99.90% N/A % Reduction in A rea: -3264.30% 99.90% N/A % Reduction in Volume: Full Thickness Without Exposed Full Thickness Without Exposed N/A Classification: Support Structures Support Structures Medium None Present N/A Exudate A mount: Serosanguineous N/A N/A Exudate Type: red,  brown N/A N/A Exudate Color: Distinct, outline attached Distinct, outline attached N/A Wound Margin: Large (67-100%) None Present (0%) N/A Granulation A mount: Red N/A N/A Granulation Quality: Small (1-33%) None Present (0%) N/A Necrotic A mount: Eschar, Adherent Slough N/A N/A Necrotic Tissue: Fat Layer (Subcutaneous Tissue): Yes Fascia: No N/A Exposed Structures: Fascia: No Fat Layer (Subcutaneous Tissue): No Tendon: No Tendon: No Muscle: No Muscle: No Joint: No Joint: No Bone: No Bone: No Small (1-33%) Large (67-100%) N/A Epithelialization: Debridement - Excisional Debridement - Selective/Open Wound N/A Debridement: Pre-procedure Verification/Time Out 14:07 14:07 N/A Taken: Lidocaine 5% topical ointment Lidocaine 5% topical ointment N/A Pain Control: Necrotic/Eschar, Subcutaneous, Necrotic/Eschar N/A Tissue Debrided: Slough Skin/Subcutaneous Tissue Non-Viable Tissue N/A Level: 3 14 N/A Debridement A (sq cm): rea Curette Curette N/A Instrument: Minimum Minimum N/A Bleeding: Pressure Pressure N/A Hemostasis Achieved: Debridement Treatment Response: Procedure was tolerated well Procedure was tolerated well N/A Post Debridement Measurements L x 2x1.5x0.2 0.1x0.1x0.1 N/A W x D (cm) 0.471 0.001 N/A Post Debridement Volume: (cm) No Abnormalities Noted Scarring: Yes N/A Periwound Skin Texture: No Abnormalities Noted Dry/Scaly: Yes N/A Periwound Skin Moisture: Hemosiderin Staining: Yes No Abnormalities Noted N/A Periwound Skin Color: No Abnormality No Abnormality N/A Temperature: Compression Therapy Debridement N/A Procedures Performed: Debridement Treatment Notes Wound #3 (Lower Leg) Wound Laterality: Left, Posterior Cleanser Soap and Water Discharge Instruction: May shower and wash wound with dial antibacterial soap and water prior to dressing change. Wound Cleanser Discharge Instruction: Cleanse the wound with wound cleanser prior to applying a clean  dressing using gauze sponges, not tissue or cotton balls. Peri-Wound Care Sween Lotion (Moisturizing lotion) Discharge Instruction: Apply moisturizing lotion as directed Topical Primary Dressing IODOFLEX 0.9% Cadexomer Iodine Pad 4x6 cm Discharge Instruction: Apply to wound bed as instructed Secondary Dressing ABD Pad, 5x9 Discharge Instruction: Apply over primary dressing as directed. Woven Gauze Sponge, Non-Sterile 4x4 in [place] E ([id number]) [id number] Page 4 of 9 Discharge Instruction: Apply over primary dressing as directed. Secured With Compression Wrap ThreePress (3 layer compression wrap) Discharge Instruction: Apply three layer compression as directed. Compression Stockings Add-Ons Wound #4 (Lower Leg) Wound Laterality: Left, Anterior Cleanser Soap and Water Discharge Instruction: May shower and wash wound with dial antibacterial soap and water prior to dressing change. Wound Cleanser Discharge Instruction: Cleanse the wound with wound cleanser prior to applying a clean dressing using gauze sponges, not tissue or cotton balls. Peri-Wound Care Sween Lotion (Moisturizing lotion) Discharge Instruction: Apply moisturizing lotion as directed Topical Primary Dressing Sorbalgon AG Dressing 2x2 (in/in) Discharge Instruction: Apply to wound bed as instructed Secondary Dressing ABD Pad, 5x9 Discharge Instruction: Apply over primary dressing as directed. Woven Gauze  Sponge, Non-Sterile 4x4 in Discharge Instruction: Apply over primary dressing as directed. Secured With Compression Wrap ThreePress (3 layer compression wrap) Discharge Instruction: Apply three layer compression as directed. Compression Stockings Add-Ons Electronic Signature(s) Signed: 11/16/2022 2:34:36 PM By: Fredirick Maudlin MD FACS Entered By: Fredirick Maudlin on 11/16/2022  14:34:35 -------------------------------------------------------------------------------- Multi-Disciplinary Care Plan Details Patient Name: Date of Service: [name], [name] 11/16/2022 2:00 PM Medical Record Number: [id number] Patient Account Number: [id number] Date of Birth/Sex: Treating RN: [birth year] (87 y.o. [name] Primary Care Versa Craton: [name] Other Clinician: Referring Aadam Zhen: Treating Ronald Londo/Extender: [name] in Treatment: 13 Active Inactive Necrotic Tissue Nursing Diagnoses: Impaired tissue integrity related to necrotic/devitalized tissue Knowledge deficit related to management of necrotic/devitalized tissue Goals: [name], [name] ([id number]) [id number] Page 5 of 9 Necrotic/devitalized tissue will be minimized in the wound bed Date Initiated: 08/25/2022 Target Resolution Date: 12/18/2022 Goal Status: Active Patient/caregiver will verbalize understanding of reason and process for debridement of necrotic tissue Date Initiated: 08/25/2022 Target Resolution Date: 12/18/2022 Goal Status: Active Interventions: Assess patient pain level pre-, during and post procedure and prior to discharge Provide education on necrotic tissue and debridement process Treatment Activities: Apply topical anesthetic as ordered : 08/25/2022 Notes: Electronic Signature(s) Signed: 11/16/2022 4:48:14 PM By: [name] Entered By: [name] on 11/16/2022 14:10:46 -------------------------------------------------------------------------------- Pain Assessment Details Patient Name: Date of Service: [name], [name] 11/16/2022 2:00 PM Medical Record Number: [id number] Patient Account Number: [id number] Date of Birth/Sex: Treating RN: 10-21-1935 (87 y.o. [name] Primary Care Kaysee Hergert: [name] Other Clinician: Referring Luiscarlos Kaczmarczyk: Treating Nysir Fergusson/Extender: [name] in  Treatment: 13 Active Problems Location of Pain Severity and Description of Pain Patient Has Paino No Site Locations Rate the pain. Current Pain Level: 0 Pain Management and Medication Current Pain Management: Electronic Signature(s) Signed: 11/16/2022 4:48:14 PM By: [name] Entered By: [name] on 11/16/2022 13:55:06 -------------------------------------------------------------------------------- Patient/Caregiver Education Details Patient Name: Date of Service: [name], [name] 2/26/2024andnbsp2:00 PM Medical Record Number: [id number] Patient Account Number: [id number] Date of Birth/Gender: Treating RN: 1936/09/21 (87 y.o. [name] Primary Care Physician: [name] Other Clinician: Referring Physician: Treating Physician/Extender: [name] in Treatment: [address] [place] ([id number]) [id number] Page 6 of 9 Education Assessment Education Provided To: Patient Education Topics Provided Wound/Skin Impairment: Methods: Explain/Verbal Responses: Reinforcements needed, State content correctly Electronic Signature(s) Signed: 11/16/2022 4:48:14 PM By: [name] Entered By: [name] on 11/16/2022 14:10:58 -------------------------------------------------------------------------------- Wound Assessment Details Patient Name: Date of Service: [name], [name] 11/16/2022 2:00 PM Medical Record Number: [id number] Patient Account Number: [id number] Date of Birth/Sex: Treating RN: 06/15/1936 (86 y.o. [name] Primary Care Cris Talavera: [name] Other Clinician: Referring Abran Gavigan: Treating Parneet Glantz/Extender: [name] in Treatment: 13 Wound Status Wound Number: 3 Primary Lesion Etiology: Wound Location: Left, Posterior Lower Leg Wound Open Wounding Event: Puncture Status: Date Acquired: 08/12/2022 Comorbid Glaucoma, Arrhythmia, Coronary Artery  Disease, Hypertension, Weeks Of Treatment: 11 History: Peripheral Venous Disease, Osteoarthritis Clustered Wound: No Photos Wound Measurements Length: (cm) 2 Width: (cm) 1.5 Depth: (cm) 0.2 Area: (cm) 2.356 Volume: (cm) 0.471 % Reduction in Area: -3218.3% % Reduction in Volume: -3264.3% Epithelialization: Small (1-33%) Tunneling: No Undermining: No Wound Description Classification: Full Thickness Without Exposed Support Structures Wound Margin: Distinct, outline attached Exudate Amount: Medium Exudate Type: Serosanguineous Exudate Color: red, brown Foul Odor After Cleansing: No Slough/Fibrino Yes Wound Bed Granulation Amount: Large (67-100%) Exposed Structure  Granulation Quality: Red Fascia Exposed: No Necrotic Amount: Small (1-33%) Fat Layer (Subcutaneous Tissue) Exposed: Yes Necrotic Quality: Eschar, Adherent Slough Tendon Exposed: No Muscle Exposed: No Joint Exposed: No Bone Exposed: No [name], [name] ([id number]) [id number] Page 7 of 9 Periwound Skin Texture Texture Color No Abnormalities Noted: Yes No Abnormalities Noted: No Hemosiderin Staining: Yes Moisture No Abnormalities Noted: Yes Temperature / Pain Temperature: No Abnormality Treatment Notes Wound #3 (Lower Leg) Wound Laterality: Left, Posterior Cleanser Soap and Water Discharge Instruction: May shower and wash wound with dial antibacterial soap and water prior to dressing change. Wound Cleanser Discharge Instruction: Cleanse the wound with wound cleanser prior to applying a clean dressing using gauze sponges, not tissue or cotton balls. Peri-Wound Care Sween Lotion (Moisturizing lotion) Discharge Instruction: Apply moisturizing lotion as directed Topical Primary Dressing IODOFLEX 0.9% Cadexomer Iodine Pad 4x6 cm Discharge Instruction: Apply to wound bed as instructed Secondary Dressing ABD Pad, 5x9 Discharge Instruction: Apply over primary dressing as  directed. Woven Gauze Sponge, Non-Sterile 4x4 in Discharge Instruction: Apply over primary dressing as directed. Secured With Compression Wrap ThreePress (3 layer compression wrap) Discharge Instruction: Apply three layer compression as directed. Compression Stockings Add-Ons Electronic Signature(s) Signed: 11/16/2022 4:48:14 PM By: [name] Entered By: [name] on 11/16/2022 14:04:38 -------------------------------------------------------------------------------- Wound Assessment Details Patient Name: Date of Service: [name], [name] 11/16/2022 2:00 PM Medical Record Number: [id number] Patient Account Number: [id number] Date of Birth/Sex: Treating RN: [birth year] (87 y.o. [name] Primary Care Jyssica Rief: [name] Other Clinician: Referring Anjelina Dung: Treating Kalayah Leske/Extender: [name] in Treatment: 13 Wound Status Wound Number: 4 Primary T be determined o Etiology: Wound Location: Left, Anterior Lower Leg Wound Open Wounding Event: Gradually Appeared Status: Date Acquired: 11/06/2022 Comorbid Glaucoma, Arrhythmia, Coronary Artery Disease, Hypertension, Weeks Of Treatment: 1 History: Peripheral Venous Disease, Osteoarthritis Clustered Wound: No Photos [name], [name] ([id number]) [id number] Page 8 of 9 Wound Measurements Length: (cm) 0.1 Width: (cm) 0.1 Depth: (cm) 0.1 Area: (cm) 0.008 Volume: (cm) 0.001 % Reduction in Area: 99.9% % Reduction in Volume: 99.9% Epithelialization: Large (67-100%) Tunneling: No Undermining: No Wound Description Classification: Full Thickness Without Exposed Support Structures Wound Margin: Distinct, outline attached Exudate Amount: None Present Foul Odor After Cleansing: No Slough/Fibrino No Wound Bed Granulation Amount: None Present (0%) Exposed Structure Necrotic Amount: None Present (0%) Fascia Exposed: No Fat Layer (Subcutaneous Tissue)  Exposed: No Tendon Exposed: No Muscle Exposed: No Joint Exposed: No Bone Exposed: No Periwound Skin Texture Texture Color No Abnormalities Noted: No No Abnormalities Noted: Yes Scarring: Yes Temperature / Pain Temperature: No Abnormality Moisture No Abnormalities Noted: No Dry / Scaly: Yes Treatment Notes Wound #4 (Lower Leg) Wound Laterality: Left, Anterior Cleanser Soap and Water Discharge Instruction: May shower and wash wound with dial antibacterial soap and water prior to dressing change. Wound Cleanser Discharge Instruction: Cleanse the wound with wound cleanser prior to applying a clean dressing using gauze sponges, not tissue or cotton balls. Peri-Wound Care Sween Lotion (Moisturizing lotion) Discharge Instruction: Apply moisturizing lotion as directed Topical Primary Dressing Sorbalgon AG Dressing 2x2 (in/in) Discharge Instruction: Apply to wound bed as instructed Secondary Dressing ABD Pad, 5x9 Discharge Instruction: Apply over primary dressing as directed. Woven Gauze Sponge, Non-Sterile 4x4 in Discharge Instruction: Apply over primary dressing as directed. Secured With Compression Wrap ThreePress (3 layer compression wrap) Discharge Instruction: Apply three layer compression as directed. [name], [name] ([id number]) [id number] Page 9 of 9 Compression Stockings Add-Ons Electronic Signature(s) Signed: 11/16/2022 4:48:14  PM By: [name] By: [name] on 11/16/2022 14:05:00 -------------------------------------------------------------------------------- Vitals Details Patient Name: Date of Service: [name], [name] 11/16/2022 2:00 PM Medical Record Number: [id number] Patient Account Number: [id number] Date of Birth/Sex: Treating RN: [birth year] (87 y.o. [name] Primary Care Haydan Wedig: [name] Other Clinician: Referring Mylasia Vorhees: Treating Jonanthan Bolender/Extender: [name] in  Treatment: 13 Vital Signs Time Taken: 13:54 Temperature (F): 97.7 Pulse (bpm): 76 Respiratory Rate (breaths/min): 16 Blood Pressure (mmHg): 155/73 Reference Range: 80 - 120 mg / dl Electronic Signature(s) Signed: 11/16/2022 4:48:14 PM By: [name] Entered By: [name] on 11/16/2022 13:54:59

## 2022-11-19 ENCOUNTER — Ambulatory Visit: Payer: Medicare Other | Admitting: Physical Therapy

## 2022-11-19 ENCOUNTER — Encounter: Payer: Self-pay | Admitting: Physical Therapy

## 2022-11-19 DIAGNOSIS — M6281 Muscle weakness (generalized): Secondary | ICD-10-CM | POA: Diagnosis not present

## 2022-11-19 DIAGNOSIS — R2689 Other abnormalities of gait and mobility: Secondary | ICD-10-CM

## 2022-11-19 NOTE — Therapy (Signed)
OUTPATIENT PHYSICAL THERAPY TREATMENT NOTE    Patient Name: OSIEL DEHAY MRN: [id number] DOB:1936/05/23, 87 y.o., male Today's Date: 11/19/2022  END OF SESSION:   PT End of Session - 11/19/22 1454     Visit Number 17    Date for PT Re-Evaluation 12/17/22    Authorization Type [employer] Medicare    Progress Note Due on Visit 50    PT Start Time 1450    PT Stop Time 1535    PT Time Calculation (min) 45 min    Activity Tolerance Patient tolerated treatment well    Behavior During Therapy [facility] for tasks assessed/performed                          Past Medical History:  Diagnosis Date   A-fib ([place])    Arthritis    BPH (benign prostatic hypertrophy)    Carotid artery disease ([place])    Doppler, December 08, 2011, 00 [id number] R. ICA, [id number] LICA, followup 1 year   Coronary artery disease    DES Circumflex or CVA 2006  /  clear, February, 2011, EF 70%, no ischemia or   Ejection fraction    EF 60%, echo, 2009, mildly calcified aortic leaflets   History of colonic polyps    Hyperlipidemia    Hypertension    Hypothyroidism    Lumbar radiculopathy    Multiple thyroid nodules    Avascular echogenic areas noted in the right thyroid at the time of carotid Doppler.    PONV (postoperative nausea and vomiting)    "nausea with first hip surgery"   Skin cancer (melanoma) ([place])    PMH of    Spinal stenosis, lumbar    Tremor    Fine tremor right upper extremity   Venous insufficiency    Past Surgical History:  Procedure Laterality Date   COLONOSCOPY     CORONARY ANGIOPLASTY WITH STENT PLACEMENT  2006   x 2 stents; DES to CX and dRCA '06   KNEE SURGERY  1970's   "chipped bone"   LUMBAR LAMINECTOMY/DECOMPRESSION MICRODISCECTOMY Left 07/13/2016   Procedure: LUMBAR LAMINECTOMY AND FORAMINOTOMY Lumbar two, three, Lumbar three-four, Lumbar four-five, LEFT Lumbar five-Sacral one DISECTOMY;  Surgeon: Newman Pies, MD;  Location: [place];  Service: Neurosurgery;  Laterality: Left;    TOE SURGERY  1997   TONSILLECTOMY     TOTAL HIP ARTHROPLASTY Left 2007   TOTAL HIP ARTHROPLASTY Right 2010   TOTAL KNEE ARTHROPLASTY Right 06/25/2014   Procedure: RIGHT TOTAL KNEE ARTHROPLASTY;  Surgeon: Gearlean Alf, MD;  Location: [facility];  Service: Orthopedics;  Laterality: Right;   VENTRICULOPERITONEAL SHUNT Right 07/09/2022   Procedure: SHUNT INSERTION VENTRICULAR-PERITONEAL;  Surgeon: Newman Pies, MD;  Location: [place];  Service: Neurosurgery;  Laterality: Right;   Patient Active Problem List   Diagnosis Date Noted   (Idiopathic) normal pressure hydrocephalus ([place]) 07/09/2022   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[place]) 02/05/2022   Gait disturbance 02/05/2022   Benign essential tremor 02/05/2022   Chronic anticoagulation 02/05/2022   Atrial fibrillation ([place]) 02/08/2019   Lumbar stenosis with neurogenic claudication 07/13/2016   Annual physical exam>>>>>>>>>>>>>>>>>> 05/04/2016   PCP NOTES>>>>>>>>>>>>>>>>>>>>>>>>>>> 12/31/2015   Edema 02/19/2015   OA (osteoarthritis) of knee 06/25/2014   Unspecified sleep apnea 06/18/2014   Multiple thyroid nodules    Tremor    Carotid artery disease ([facility])    Erectile dysfunction 05/31/2012   Coronary artery disease    Venous insufficiency    Hyperglycemia 11/09/2008  MELANOMA, HX OF 11/09/2008   COLONIC POLYPS, HYPERPLASTIC 05/10/2008   Hypothyroidism 08/10/2007   HYPERLIPIDEMIA 08/10/2007   Essential hypertension 08/10/2007   DJD (degenerative joint disease) 08/08/2007   MURMUR 08/08/2007   BENIGN PROSTATIC HYPERTROPHY, HX OF 08/05/2007     THERAPY DIAG:  Other abnormalities of gait and mobility  Muscle weakness (generalized)   PCP: [name], MD   REFERRING PROVIDER: Newman Pies, MD   REFERRING DIAG: G91.1 (ICD-10-CM) - Obstructive hydrocephalus R26.9 (ICD-10-CM) - Gait disturbance   Rationale for Evaluation and Treatment: Rehabilitation   THERAPY DIAG:  Other abnormalities of gait and mobility   Muscle weakness  (generalized)   ONSET DATE: chronic - has done PT before multiple times for strength, balance etc   SUBJECTIVE: I am doing well.  I am getting around well.                                                                                         SUBJECTIVE STATEMENT:   Eval: Pt is referred to OPPT for gait training following VP shunt placement 07/09/22 for communicating hydrocephalus. Has done PT multiple times before and wants to do it again for strength, walking and balance.  He uses a SPC in community and either furniture surfs or uses cane at home. He feels he can do some walking without it.   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  His wife reports he can't go shopping with her due to poor endurance.    PERTINENT HISTORY:  VP shunt placed 07/09/22 for communicating hydrocephalus PMH includes lumbar surgery, bil THA, Rt TKA, a-fib, CAD, coronary angioplasty with stent, compromised ejection fraction   PAIN:  PAIN:  Are you having pain? No       PRECAUTIONS: Other: heart history, VP shunt placed 06/2022 for communicating hydrocephalus   WEIGHT BEARING RESTRICTIONS: No   FALLS:  Has patient fallen in last 6 months? No   LIVING ENVIRONMENT: Lives with: lives with their spouse Lives in: House/apartment Stairs: Yes: Internal: 14 steps; can reach both and External: 4 steps; can reach both, bedroom on main level  Has following equipment at home: Single point cane - uses it in community and furniture surfs at home or uses cane   OCCUPATION: retired   PLOF: Independent with household mobility with device and Independent with community mobility with device   PATIENT GOALS: strength, balance and gait   NEXT MD VISIT:    OBJECTIVE:    DIAGNOSTIC FINDINGS:  N/a   PATIENT SURVEYS:      SCREENING FOR RED FLAGS: Bowel or bladder incontinence: Yes: leaking after urination and if gets too full Spinal tumors: No Cauda equina syndrome: No Compression fracture:  No Abdominal aneurysm: No   COGNITION: Overall cognitive status: Within functional limits for tasks assessed                          SENSATION: WFL   MUSCLE LENGTH: Hamstrings: Right 45 deg; Left 45 deg     POSTURE:  10/13/22: improved postural awareness, needs intermittent cueing to stand tall  Evaluation: flexed  trunk  and weight shift right   PALPATION: N/a   LUMBAR ROM:    AROM eval 10/13/22 10/22/22  Flexion Flexes knees with fingers to mid shin Flexes knees with fingers to mid shin Flexes knees with fingers to mid shin  Extension NT 8 8  Right lateral flexion '5 8 8  '$ Left lateral flexion '8 8 8  '$ Right rotation 25% 50% 50%  Left rotation 25% 50% 50%   (Blank rows = not tested)   LOWER EXTREMITY ROM:    Hip ROM grossly limited in ER 50% bil   LOWER EXTREMITY MMT:   10/13/22: 4+/5 bil LE  Eval: Grossly 4/5 on Rt Poor glut med and quad activation in stance phase of gait   LUMBAR SPECIAL TESTS:      FUNCTIONAL TESTS:  10/22/22: 5 times sit to stand: 14 sec no UE Timed up and go (TUG): 17 sec with SPC, 16 sec with SPC Berg Balance Scale: 50/56     10/08/22:  5x sit to stand no UE assist 14 sec TUG:16 sec with SPC  Eval: 5 times sit to stand: 17 sec mod/max use of hands on chair Timed up and go (TUG): 22 sec with SPC Berg Balance Scale: 39/56   GAIT: Distance walked: 3MWT Assistive device utilized: Single point cane Level of assistance: Modified independence Comments: with SPC, covers 340' with SOB at 1:30, needs seated break   TODAY'S TREATMENT:  11/19/22: NuStep L5/L4/L3 taper resistance over 10' due to Pt fatigue of bil UE Single rail 4 stair flight step to pattern 3 rounds Leg press seat 9 90lb bil LE 2x20, 40lb single leg 1x15 each LE Outdoor ambulation with SPC and gait belt x 57mn30sec, CGA: level surface, grassy surface, incline/decline, curbs, ramps Hip matrix 2x10 bil LE hip abd x25lb  11/17/22: NuStep L5/L4/L3 taper resistance over 10' due  to Pt fatigue of bil UE Discussed increasing walking goals at home to 3x/day until feeling just slightly winded (about 1') Gait with SPC from ortho gym to cancer gym and back before seated break for SOB Leg press seat 9 90lb bil LE 2x20, 40lb single leg 1x15 each LE Hip matrix 2x10 bil LE hip abd/ext 25lb   11/12/22: NuStep L6 x 4', decreased to L5x 6', PT present to monitor Hip matrix 1x10 bil LE hip abd/ext 25lb Leg press seat 9 90lb bil LE 1x10, 1x16 Outdoor ambulation with SPC, with gait belt and close supervision/CGA: level surface, grassy surface, incline/decline, curbs, ramps  PATIENT EDUCATION:  Education details: Access Code: [email or web address] educated: Patient and Spouse Education method: Explanation, Demonstration, and Handouts Education comprehension: verbalized understanding and returned demonstration   HOME EXERCISE PROGRAM: Access Code: [email or web address]: [email or web address] Date: 09/30/2022 Prepared by: JVenetia NightBeuhring  Exercises - Seated Long Arc Quad  - 1 x daily - 7 x weekly - 1 sets - 10 reps - 5 hold - Standing Hip Abduction with Counter Support  - 1 x daily - 7 x weekly - 1 sets - 10 reps - Standing Hip Extension with Counter Support  - 1 x daily - 7 x weekly - 1 sets - 10 reps - Heel Raises with Counter Support  - 1 x daily - 7 x weekly - 1 sets - 10 reps - Seated Hip Adduction Isometrics with Ball  - 1 x daily - 7 x weekly - 1 sets - 10 reps - Seated March  - 1 x daily - 7 x weekly - 1 sets - 10  reps - Ankle Pumps in Elevation  - 1 x daily - 7 x weekly - 1 sets - 10 reps - Forward Step Up  - 3 x daily - 7 x weekly - 2 sets - 10 reps - Standing Forward Toe Taps on Box (BKA)  - 3 x daily - 7 x weekly - 2 sets - 10 reps - Sit to Stand Without Arm Support  - 3 x daily - 7 x weekly - 1 sets - 10 reps - Standing Row with Anchored Resistance  - 1 x daily - 7 x weekly - 2 sets - 10 reps - Shoulder extension with resistance - Neutral  - 1 x daily - 7 x  weekly - 2 sets - 10 reps   ASSESSMENT:   CLINICAL IMPRESSION: Pt demos improved stability with gait using SPC with less lateral trunk lean and improved glut strength in stance phase.  He was able to ambulate on varied surfaces and inclines outdoors today x6.5 min which challenged cardiovascular system.  He needs safety cueing on stairs to perform step to pattern.  PT reiterated importance of daily walking at home 3x/day and going up/down stairs at home 1x/day to maintain gains in PT. Continue along POC.   OBJECTIVE IMPAIRMENTS: Abnormal gait, cardiopulmonary status limiting activity, decreased activity tolerance, decreased balance, decreased coordination, decreased endurance, decreased knowledge of condition, decreased mobility, difficulty walking, decreased ROM, decreased strength, decreased safety awareness, hypomobility, increased edema, impaired flexibility, improper body mechanics, and postural dysfunction.    ACTIVITY LIMITATIONS: carrying, lifting, bending, standing, squatting, stairs, transfers, dressing, and locomotion level   PARTICIPATION LIMITATIONS: meal prep, cleaning, laundry, shopping, community activity, and yard work   PERSONAL FACTORS: Age, Time since onset of injury/illness/exacerbation, and 3+ comorbidities: + cardiac history, hydrocephalus with shunt, LE edema, Rt LE weakness with Hx of THA and TKA  are also affecting patient's functional outcome.    REHAB POTENTIAL: Good   CLINICAL DECISION MAKING: Stable/uncomplicated   EVALUATION COMPLEXITY: Low     GOALS: Goals reviewed with patient? Yes   SHORT TERM GOALS: Target date: 09/24/22   Pt will be ind with initial HEP Baseline: Goal status: met   2.  Pt will improve 5X sit to stand to </= 15 sec with min use of UE Baseline: 17 sec with mod/max use of UE Goal status: met, 14 sec   3.  Pt will demo improved quad and lateral hip muscle activations on Rt with stance phase of gait. Baseline:  Goal status: met    4.  Improve TUG to 19 sec or less. Baseline: 22 sec Goal status: met, 16 sec      LONG TERM GOALS: Target date: 10/22/22   Pt will be ind with advanced HEP to maintain gains from PT. Baseline:  Goal status: ongoing   2.  Pt will improve TUG to 17 sec or less to demo improved efficiency and safety with functional mobility. Baseline:  Goal status: met, 16 sec   3.  Improve 5X sit to stand to 13 sec or less with min or no use of UE to demo reduced fall risk Baseline: 17 sec Goal status: ongoing, 14 sec   4.  Pt will demo improved gait endurance to allow for a 3 min walk test. Baseline: covered 350' on 10/20/22 Goal status: needed break at 2:00 for SOB   5.  Pt will improve LE strength to at least 4+/5 on Rt for improved gait safety, transfers and stairs Baseline: 4/5 Goal  status: INITIAL   6.  Improve BERG Balance Test to at least 44/52 to demo improved static and dynamic balance to reduce fall risk. Baseline: 39/52 Goal status: met on 10/13/22   PLAN:   PT FREQUENCY: 2x/week   PT DURATION: 8 weeks   PLANNED INTERVENTIONS: Therapeutic exercises, Therapeutic activity, Neuromuscular re-education, Balance training, Gait training, Patient/Family education, Self Care, Joint mobilization, Stair training, DME instructions, Electrical stimulation, Spinal mobilization, Cryotherapy, Moist heat, and Manual therapy.   PLAN FOR NEXT SESSION: NuStep, hip matrix and leg press, indoor and outdoor ambulation with cane,  continue balance, functional strength , postural strength  Terrel Nesheiwat, PT 11/19/22 3:45 PM

## 2022-11-24 ENCOUNTER — Encounter: Payer: Self-pay | Admitting: Physical Therapy

## 2022-11-24 ENCOUNTER — Encounter (HOSPITAL_BASED_OUTPATIENT_CLINIC_OR_DEPARTMENT_OTHER): Payer: Medicare Other | Attending: General Surgery | Admitting: General Surgery

## 2022-11-24 ENCOUNTER — Ambulatory Visit: Payer: Medicare Other | Attending: Neurosurgery | Admitting: Physical Therapy

## 2022-11-24 DIAGNOSIS — R2689 Other abnormalities of gait and mobility: Secondary | ICD-10-CM | POA: Diagnosis not present

## 2022-11-24 DIAGNOSIS — S81802A Unspecified open wound, left lower leg, initial encounter: Secondary | ICD-10-CM | POA: Diagnosis not present

## 2022-11-24 DIAGNOSIS — Z85828 Personal history of other malignant neoplasm of skin: Secondary | ICD-10-CM | POA: Diagnosis not present

## 2022-11-24 DIAGNOSIS — I10 Essential (primary) hypertension: Secondary | ICD-10-CM | POA: Insufficient documentation

## 2022-11-24 DIAGNOSIS — Z7901 Long term (current) use of anticoagulants: Secondary | ICD-10-CM | POA: Insufficient documentation

## 2022-11-24 DIAGNOSIS — Z79899 Other long term (current) drug therapy: Secondary | ICD-10-CM | POA: Insufficient documentation

## 2022-11-24 DIAGNOSIS — L989 Disorder of the skin and subcutaneous tissue, unspecified: Secondary | ICD-10-CM | POA: Diagnosis not present

## 2022-11-24 DIAGNOSIS — I480 Paroxysmal atrial fibrillation: Secondary | ICD-10-CM | POA: Insufficient documentation

## 2022-11-24 DIAGNOSIS — I2510 Atherosclerotic heart disease of native coronary artery without angina pectoris: Secondary | ICD-10-CM | POA: Diagnosis not present

## 2022-11-24 DIAGNOSIS — M6281 Muscle weakness (generalized): Secondary | ICD-10-CM

## 2022-11-24 DIAGNOSIS — Z955 Presence of coronary angioplasty implant and graft: Secondary | ICD-10-CM | POA: Diagnosis not present

## 2022-11-24 DIAGNOSIS — L988 Other specified disorders of the skin and subcutaneous tissue: Secondary | ICD-10-CM | POA: Diagnosis not present

## 2022-11-24 DIAGNOSIS — L97222 Non-pressure chronic ulcer of left calf with fat layer exposed: Secondary | ICD-10-CM | POA: Insufficient documentation

## 2022-11-24 DIAGNOSIS — I872 Venous insufficiency (chronic) (peripheral): Secondary | ICD-10-CM | POA: Diagnosis not present

## 2022-11-24 NOTE — Therapy (Signed)
OUTPATIENT PHYSICAL THERAPY TREATMENT NOTE    Patient Name: [name] MRN: [id number] DOB:01-23-1936, 87 y.o., male Today's Date: 11/24/2022  END OF SESSION:   PT End of Session - 11/24/22 1246     Visit Number 18    Date for PT Re-Evaluation 12/17/22    Authorization Type [employer] Medicare    Progress Note Due on Visit 49    PT Start Time 72    PT Stop Time 1315    PT Time Calculation (min) 45 min    Equipment Utilized During Treatment Gait belt    Activity Tolerance Patient tolerated treatment well    Behavior During Therapy WFL for tasks assessed/performed                           Past Medical History:  Diagnosis Date   A-fib ([place])    Arthritis    BPH (benign prostatic hypertrophy)    Carotid artery disease ([place])    Doppler, December 08, 2011, 00 [id number] R. ICA, [id number] LICA, followup 1 year   Coronary artery disease    DES Circumflex or CVA 2006  /  clear, February, 2011, EF 70%, no ischemia or   Ejection fraction    EF 60%, echo, 2009, mildly calcified aortic leaflets   History of colonic polyps    Hyperlipidemia    Hypertension    Hypothyroidism    Lumbar radiculopathy    Multiple thyroid nodules    Avascular echogenic areas noted in the right thyroid at the time of carotid Doppler.    PONV (postoperative nausea and vomiting)    "nausea with first hip surgery"   Skin cancer (melanoma) ([place])    PMH of    Spinal stenosis, lumbar    Tremor    Fine tremor right upper extremity   Venous insufficiency    Past Surgical History:  Procedure Laterality Date   COLONOSCOPY     CORONARY ANGIOPLASTY WITH STENT PLACEMENT  2006   x 2 stents; DES to CX and dRCA '06   KNEE SURGERY  1970's   "chipped bone"   LUMBAR LAMINECTOMY/DECOMPRESSION MICRODISCECTOMY Left 07/13/2016   Procedure: LUMBAR LAMINECTOMY AND FORAMINOTOMY Lumbar two, three, Lumbar three-four, Lumbar four-five, LEFT Lumbar five-Sacral one DISECTOMY;  Surgeon: Newman Pies, MD;  Location: [facility];  Service: Neurosurgery;  Laterality: Left;   TOE SURGERY  1997   TONSILLECTOMY     TOTAL HIP ARTHROPLASTY Left 2007   TOTAL HIP ARTHROPLASTY Right 2010   TOTAL KNEE ARTHROPLASTY Right 06/25/2014   Procedure: RIGHT TOTAL KNEE ARTHROPLASTY;  Surgeon: Gearlean Alf, MD;  Location: [facility];  Service: Orthopedics;  Laterality: Right;   VENTRICULOPERITONEAL SHUNT Right 07/09/2022   Procedure: SHUNT INSERTION VENTRICULAR-PERITONEAL;  Surgeon: Newman Pies, MD;  Location: [place];  Service: Neurosurgery;  Laterality: Right;   Patient Active Problem List   Diagnosis Date Noted   (Idiopathic) normal pressure hydrocephalus ([place]) 07/09/2022   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[place]) 02/05/2022   Gait disturbance 02/05/2022   Benign essential tremor 02/05/2022   Chronic anticoagulation 02/05/2022   Atrial fibrillation ([place]) 02/08/2019   Lumbar stenosis with neurogenic claudication 07/13/2016   Annual physical exam>>>>>>>>>>>>>>>>>> 05/04/2016   PCP NOTES>>>>>>>>>>>>>>>>>>>>>>>>>>> 12/31/2015   Edema 02/19/2015   OA (osteoarthritis) of knee 06/25/2014   Unspecified sleep apnea 06/18/2014   Multiple thyroid nodules    Tremor    Carotid artery disease ([facility])    Erectile dysfunction 05/31/2012   Coronary artery disease  Venous insufficiency    Hyperglycemia 11/09/2008   MELANOMA, HX OF 11/09/2008   COLONIC POLYPS, HYPERPLASTIC 05/10/2008   Hypothyroidism 08/10/2007   HYPERLIPIDEMIA 08/10/2007   Essential hypertension 08/10/2007   DJD (degenerative joint disease) 08/08/2007   MURMUR 08/08/2007   BENIGN PROSTATIC HYPERTROPHY, HX OF 08/05/2007     THERAPY DIAG:  Other abnormalities of gait and mobility  Muscle weakness (generalized)   PCP: [name], MD   REFERRING PROVIDER: Newman Pies, MD   REFERRING DIAG: G91.1 (ICD-10-CM) - Obstructive hydrocephalus R26.9 (ICD-10-CM) - Gait disturbance   Rationale for Evaluation and Treatment: Rehabilitation   THERAPY DIAG:  Other  abnormalities of gait and mobility   Muscle weakness (generalized)   ONSET DATE: chronic - has done PT before multiple times for strength, balance etc   SUBJECTIVE: I am doing well.  I am getting around well.                                                                                         SUBJECTIVE STATEMENT:   Eval: Pt is referred to OPPT for gait training following VP shunt placement 07/09/22 for communicating hydrocephalus. Has done PT multiple times before and wants to do it again for strength, walking and balance.  He uses a SPC in community and either furniture surfs or uses cane at home. He feels he can do some walking without it.   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  His wife reports he can't go shopping with her due to poor endurance.    PERTINENT HISTORY:  VP shunt placed 07/09/22 for communicating hydrocephalus PMH includes lumbar surgery, bil THA, Rt TKA, a-fib, CAD, coronary angioplasty with stent, compromised ejection fraction   PAIN:  PAIN:  Are you having pain? No       PRECAUTIONS: Other: heart history, VP shunt placed 06/2022 for communicating hydrocephalus   WEIGHT BEARING RESTRICTIONS: No   FALLS:  Has patient fallen in last 6 months? No   LIVING ENVIRONMENT: Lives with: lives with their spouse Lives in: House/apartment Stairs: Yes: Internal: 14 steps; can reach both and External: 4 steps; can reach both, bedroom on main level  Has following equipment at home: Single point cane - uses it in community and furniture surfs at home or uses cane   OCCUPATION: retired   PLOF: Independent with household mobility with device and Independent with community mobility with device   PATIENT GOALS: strength, balance and gait   NEXT MD VISIT:    OBJECTIVE:    DIAGNOSTIC FINDINGS:  N/a   PATIENT SURVEYS:      SCREENING FOR RED FLAGS: Bowel or bladder incontinence: Yes: leaking after urination and if gets too full Spinal  tumors: No Cauda equina syndrome: No Compression fracture: No Abdominal aneurysm: No   COGNITION: Overall cognitive status: Within functional limits for tasks assessed                          SENSATION: WFL   MUSCLE LENGTH: Hamstrings: Right 45 deg; Left 45 deg     POSTURE:  10/13/22: improved postural awareness,  needs intermittent cueing to stand tall  Evaluation: flexed trunk  and weight shift right   PALPATION: N/a   LUMBAR ROM:    AROM eval 10/13/22 10/22/22  Flexion Flexes knees with fingers to mid shin Flexes knees with fingers to mid shin Flexes knees with fingers to mid shin  Extension NT 8 8  Right lateral flexion '5 8 8  '$ Left lateral flexion '8 8 8  '$ Right rotation 25% 50% 50%  Left rotation 25% 50% 50%   (Blank rows = not tested)   LOWER EXTREMITY ROM:    Hip ROM grossly limited in ER 50% bil   LOWER EXTREMITY MMT:   10/13/22: 4+/5 bil LE  Eval: Grossly 4/5 on Rt Poor glut med and quad activation in stance phase of gait   LUMBAR SPECIAL TESTS:      FUNCTIONAL TESTS:  10/22/22: 5 times sit to stand: 14 sec no UE Timed up and go (TUG): 17 sec with SPC, 16 sec with SPC Berg Balance Scale: 50/56     10/08/22:  5x sit to stand no UE assist 14 sec TUG:16 sec with SPC  Eval: 5 times sit to stand: 17 sec mod/max use of hands on chair Timed up and go (TUG): 22 sec with SPC Berg Balance Scale: 39/56   GAIT: Distance walked: 3MWT Assistive device utilized: Single point cane Level of assistance: Modified independence Comments: with SPC, covers 340' with SOB at 1:30, needs seated break   TODAY'S TREATMENT:  11/24/22: Outdoor ambulation with SPC and gait belt x 92mn30sec, CGA: level surface, grassy surface, incline/decline, curbs, ramps - needed 2 standing breaks today for SOB Stepping with gait belt: box stepping, step fwd and side with ipsilateral UE reach 2x5 rounds, bwd walking, sidestepping Standing bil UE row blue 1x15 each: square stance and stagger  stance Lt and Rt Leg press seat 9 90lb bil LE 2x20, 40lb single leg 1x15 each LE NuStep L5 x5' active cooldown PT present to monitor  11/19/22: NuStep L5/L4/L3 taper resistance over 10' due to Pt fatigue of bil UE Single rail 4 stair flight step to pattern 3 rounds Leg press seat 9 90lb bil LE 2x20, 40lb single leg 1x15 each LE Outdoor ambulation with SPC and gait belt x 621m30sec, CGA: level surface, grassy surface, incline/decline, curbs, ramps Hip matrix 2x10 bil LE hip abd x25lb  11/17/22: NuStep L5/L4/L3 taper resistance over 10' due to Pt fatigue of bil UE Discussed increasing walking goals at home to 3x/day until feeling just slightly winded (about 1') Gait with SPC from ortho gym to cancer gym and back before seated break for SOB Leg press seat 9 90lb bil LE 2x20, 40lb single leg 1x15 each LE Hip matrix 2x10 bil LE hip abd/ext 25lb   PATIENT EDUCATION:  Education details: Access Code: [email or web address] educated: Patient and Spouse Education method: Explanation, Demonstration, and Handouts Education comprehension: verbalized understanding and returned demonstration   HOME EXERCISE PROGRAM: Access Code: [email or web address]: [email or web address] Date: 09/30/2022 Prepared by: JoVenetia Nighteuhring  Exercises - Seated Long Arc Quad  - 1 x daily - 7 x weekly - 1 sets - 10 reps - 5 hold - Standing Hip Abduction with Counter Support  - 1 x daily - 7 x weekly - 1 sets - 10 reps - Standing Hip Extension with Counter Support  - 1 x daily - 7 x weekly - 1 sets - 10 reps - Heel Raises with Counter Support  - 1 x daily - 7 x weekly -  1 sets - 10 reps - Seated Hip Adduction Isometrics with Ball  - 1 x daily - 7 x weekly - 1 sets - 10 reps - Seated March  - 1 x daily - 7 x weekly - 1 sets - 10 reps - Ankle Pumps in Elevation  - 1 x daily - 7 x weekly - 1 sets - 10 reps - Forward Step Up  - 3 x daily - 7 x weekly - 2 sets - 10 reps - Standing Forward Toe Taps on Box (BKA)  - 3 x daily -  7 x weekly - 2 sets - 10 reps - Sit to Stand Without Arm Support  - 3 x daily - 7 x weekly - 1 sets - 10 reps - Standing Row with Anchored Resistance  - 1 x daily - 7 x weekly - 2 sets - 10 reps - Shoulder extension with resistance - Neutral  - 1 x daily - 7 x weekly - 2 sets - 10 reps   ASSESSMENT:   CLINICAL IMPRESSION: Pt demos improved stability with gait using SPC with less lateral trunk lean and improved glut strength in stance phase.  He did not use his cane within clinic as he transitioned from exercises with close supervision by PT for safety without LOB.  Pt was able to repeat outdoor ambulation from last visit on varied surfaces and inclines outdoors today x6.5 min which challenged cardiovascular system.  Gait belt used for safety.  He needed cueing with coordination challenges today for box stepping.  PT reiterated importance of daily walking at home 3x/day and going up/down stairs at home 1x/day to maintain gains in PT. Continue along POC.   OBJECTIVE IMPAIRMENTS: Abnormal gait, cardiopulmonary status limiting activity, decreased activity tolerance, decreased balance, decreased coordination, decreased endurance, decreased knowledge of condition, decreased mobility, difficulty walking, decreased ROM, decreased strength, decreased safety awareness, hypomobility, increased edema, impaired flexibility, improper body mechanics, and postural dysfunction.    ACTIVITY LIMITATIONS: carrying, lifting, bending, standing, squatting, stairs, transfers, dressing, and locomotion level   PARTICIPATION LIMITATIONS: meal prep, cleaning, laundry, shopping, community activity, and yard work   PERSONAL FACTORS: Age, Time since onset of injury/illness/exacerbation, and 3+ comorbidities: + cardiac history, hydrocephalus with shunt, LE edema, Rt LE weakness with Hx of THA and TKA  are also affecting patient's functional outcome.    REHAB POTENTIAL: Good   CLINICAL DECISION MAKING: Stable/uncomplicated    EVALUATION COMPLEXITY: Low     GOALS: Goals reviewed with patient? Yes   SHORT TERM GOALS: Target date: 09/24/22   Pt will be ind with initial HEP Baseline: Goal status: met   2.  Pt will improve 5X sit to stand to </= 15 sec with min use of UE Baseline: 17 sec with mod/max use of UE Goal status: met, 14 sec   3.  Pt will demo improved quad and lateral hip muscle activations on Rt with stance phase of gait. Baseline:  Goal status: met   4.  Improve TUG to 19 sec or less. Baseline: 22 sec Goal status: met, 16 sec      LONG TERM GOALS: Target date: 10/22/22   Pt will be ind with advanced HEP to maintain gains from PT. Baseline:  Goal status: ongoing   2.  Pt will improve TUG to 17 sec or less to demo improved efficiency and safety with functional mobility. Baseline:  Goal status: met, 16 sec   3.  Improve 5X sit to stand to 13 sec or  less with min or no use of UE to demo reduced fall risk Baseline: 17 sec Goal status: ongoing, 14 sec   4.  Pt will demo improved gait endurance to allow for a 3 min walk test. Baseline: covered 350' on 10/20/22 Goal status: needed break at 2:00 for SOB   5.  Pt will improve LE strength to at least 4+/5 on Rt for improved gait safety, transfers and stairs Baseline: 4/5 Goal status: INITIAL   6.  Improve BERG Balance Test to at least 44/52 to demo improved static and dynamic balance to reduce fall risk. Baseline: 39/52 Goal status: met on 10/13/22   PLAN:   PT FREQUENCY: 2x/week   PT DURATION: 8 weeks   PLANNED INTERVENTIONS: Therapeutic exercises, Therapeutic activity, Neuromuscular re-education, Balance training, Gait training, Patient/Family education, Self Care, Joint mobilization, Stair training, DME instructions, Electrical stimulation, Spinal mobilization, Cryotherapy, Moist heat, and Manual therapy.   PLAN FOR NEXT SESSION: NuStep, hip matrix and leg press, indoor and outdoor ambulation with cane,  continue balance, functional  strength , postural strength  Joy Reiger, PT 11/24/22 1:22 PM

## 2022-11-26 ENCOUNTER — Ambulatory Visit: Payer: Medicare Other | Admitting: Physical Therapy

## 2022-11-26 ENCOUNTER — Encounter: Payer: Self-pay | Admitting: Physical Therapy

## 2022-11-26 DIAGNOSIS — R2689 Other abnormalities of gait and mobility: Secondary | ICD-10-CM | POA: Diagnosis not present

## 2022-11-26 DIAGNOSIS — M6281 Muscle weakness (generalized): Secondary | ICD-10-CM | POA: Diagnosis not present

## 2022-11-26 NOTE — Progress Notes (Signed)
[name], [name] ([id number]) [id number] Page 1 of 8 Visit Report for 11/24/2022 Arrival Information Details Patient Name: Date of Service: [name], [place] [address] [name] 11/24/2022 2:45 PM Medical Record Number: [id number] Patient Account Number: [id number] Date of Birth/Sex: Treating RN: March 19, 1936 (87 y.o. [name] Primary Care Niki Payment: [name] Other Clinician: Referring Darey Hershberger: Treating Harneet Noblett/Extender: [name] in Treatment: 15 Visit Information History Since Last Visit Added or deleted any medications: No Patient Arrived: [name] Any new allergies or adverse reactions: No Arrival Time: 14:30 Had a fall or experienced change in No Accompanied By: wife activities of daily living that may affect Transfer Assistance: None risk of falls: Patient Identification Verified: Yes Signs or symptoms of abuse/neglect since last visito No Secondary Verification Process Completed: Yes Hospitalized since last visit: No Implantable device outside of the clinic excluding No cellular tissue based products placed in the center since last visit: Has Dressing in Place as Prescribed: Yes Has Compression in Place as Prescribed: Yes Pain Present Now: No Electronic Signature(s) Signed: 11/24/2022 3:52:49 PM By: [name] Entered By: [name] on 11/24/2022 14:31:09 -------------------------------------------------------------------------------- Compression Therapy Details Patient Name: Date of Service: [name], [name] [name] 11/24/2022 2:45 PM Medical Record Number: [id number] Patient Account Number: [id number] Date of Birth/Sex: Treating RN: January 04, 1936 (87 y.o. [name] Primary Care Kentrell Hallahan: [name] Other Clinician: Referring Maecy Podgurski: Treating Caswell Alvillar/Extender: [name] in Treatment: 15 Compression Therapy Performed for Wound Assessment: Wound #3 Left,Posterior Lower Leg Performed By:  Clinician [name], RN Compression Type: Three Layer Post Procedure Diagnosis Same as Pre-procedure Electronic Signature(s) Signed: 11/24/2022 3:52:49 PM By: [name] By: [name] on 11/24/2022 14:51:48 -------------------------------------------------------------------------------- Encounter Discharge Information Details Patient Name: Date of Service: [name], [name] [name] 11/24/2022 2:45 PM Medical Record Number: [id number] Patient Account Number: [id number] Date of Birth/Sex: Treating RN: 12/20/1935 (87 y.o. [name] Primary Care Draysen Weygandt: [name] Other Clinician: Referring Jaylee Lantry: Treating Terah Robey/Extender: [name] in Treatment: 15 Encounter Discharge Information Items Post Procedure Vitals Discharge Condition: Stable Temperature (F): 97.8 Ambulatory Status: Cane Pulse (bpm): 65 Discharge Destination: Home Respiratory Rate (breaths/min): 16 Transportation: Private Auto Blood Pressure (mmHg): 127/74 [name], [name] ([id number]) [id number] Page 2 of 8 Accompanied By: wife Schedule Follow-up Appointment: Yes Clinical Summary of Care: Patient Declined Electronic Signature(s) Signed: 11/24/2022 3:52:49 PM By: [name] By: [name] on 11/24/2022 15:01:42 -------------------------------------------------------------------------------- Lower Extremity Assessment Details Patient Name: Date of Service: [name], [name] [name] 11/24/2022 2:45 PM Medical Record Number: [id number] Patient Account Number: [id number] Date of Birth/Sex: Treating RN: 1936/04/25 (87 y.o. [name] Primary Care Hjalmer Iovino: [name] Other Clinician: Referring Allyiah Gartner: Treating Mahala Rommel/Extender: [name] in Treatment: 15 Edema Assessment Assessed: [Left: No] [Right: No] [Left: Edema] [Right: :] Calf Left: Right: Point of Measurement: From Medial  Instep 41.5 cm Ankle Left: Right: Point of Measurement: From Medial Instep 23.2 cm Vascular Assessment Pulses: Dorsalis Pedis Palpable: [Left:Yes] Electronic Signature(s) Signed: 11/24/2022 3:52:49 PM By: [name] Entered By: [name] on 11/24/2022 14:36:08 -------------------------------------------------------------------------------- Multi Wound Chart Details Patient Name: Date of Service: [name], [name] [name] 11/24/2022 2:45 PM Medical Record Number: [id number] Patient Account Number: [id number] Date of Birth/Sex: Treating RN: [birth year] (87 y.o. M) Primary Care Lexii Walsh: [name] Other Clinician: Referring Ramonte Mena: Treating Janiel Crisostomo/Extender: [name] in Treatment: 15 Vital Signs Height(in): Pulse(bpm): 65 Weight(lbs): Blood Pressure(mmHg): 127/74 Body Mass Index(BMI): Temperature(F): 97.8 Respiratory  Rate(breaths/min): 16 [3:Photos:] [N/A:N/A [id number] Page 3 of 8] Left, Posterior Lower Leg Left, Anterior Lower Leg N/A Wound Location: Puncture Gradually Appeared N/A Wounding Event: Lesion Lymphedema N/A Primary Etiology: Glaucoma, Arrhythmia, Coronary Glaucoma, Arrhythmia, Coronary N/A Comorbid History: Artery Disease, Hypertension, Artery Disease, Hypertension, Peripheral Venous Disease, Peripheral Venous Disease, Osteoarthritis Osteoarthritis 08/12/2022 11/06/2022 N/A Date Acquired: 13 2 N/A Weeks of Treatment: Open Open N/A Wound Status: No No N/A Wound Recurrence: 2.1x1.4x0.2 0x0x0 N/A Measurements L x W x D (cm) 2.309 0 N/A A (cm) : rea 0.462 0 N/A Volume (cm) : -3152.10% 100.00% N/A % Reduction in A rea: -3200.00% 100.00% N/A % Reduction in Volume: Full Thickness Without Exposed Full Thickness Without Exposed N/A Classification: Support Structures Support Structures Medium None Present N/A Exudate A mount: Serosanguineous N/A N/A Exudate Type: red, brown N/A  N/A Exudate Color: Distinct, outline attached Distinct, outline attached N/A Wound Margin: Medium (34-66%) None Present (0%) N/A Granulation A mount: Red N/A N/A Granulation Quality: Medium (34-66%) None Present (0%) N/A Necrotic A mount: Eschar, Adherent Slough N/A N/A Necrotic Tissue: Fat Layer (Subcutaneous Tissue): Yes Fascia: No N/A Exposed Structures: Fascia: No Fat Layer (Subcutaneous Tissue): No Tendon: No Tendon: No Muscle: No Muscle: No Joint: No Joint: No Bone: No Bone: No Small (1-33%) Large (67-100%) N/A Epithelialization: Debridement - Excisional N/A N/A Debridement: Pre-procedure Verification/Time Out 14:48 N/A N/A Taken: Lidocaine 4% Topical Solution N/A N/A Pain Control: Necrotic/Eschar, Subcutaneous, N/A N/A Tissue Debrided: Slough Skin/Subcutaneous Tissue N/A N/A Level: 2.94 N/A N/A Debridement A (sq cm): rea Curette N/A N/A Instrument: Minimum N/A N/A Bleeding: Pressure N/A N/A Hemostasis Achieved: Debridement Treatment Response: Procedure was tolerated well N/A N/A Post Debridement Measurements L x 2.1x1.4x0.2 N/A N/A W x D (cm) 0.462 N/A N/A Post Debridement Volume: (cm) No Abnormalities Noted Scarring: Yes N/A Periwound Skin Texture: No Abnormalities Noted Dry/Scaly: Yes N/A Periwound Skin Moisture: Hemosiderin Staining: Yes No Abnormalities Noted N/A Periwound Skin Color: No Abnormality No Abnormality N/A Temperature: Compression Therapy N/A N/A Procedures Performed: Debridement Treatment Notes Wound #3 (Lower Leg) Wound Laterality: Left, Posterior Cleanser Soap and Water Discharge Instruction: May shower and wash wound with dial antibacterial soap and water prior to dressing change. Wound Cleanser Discharge Instruction: Cleanse the wound with wound cleanser prior to applying a clean dressing using gauze sponges, not tissue or cotton balls. Peri-Wound Care Sween Lotion (Moisturizing lotion) Discharge Instruction: Apply  moisturizing lotion as directed Topical Primary Dressing IODOFLEX 0.9% Cadexomer Iodine Pad 4x6 cm Discharge Instruction: Apply to wound bed as instructed Secondary Dressing ABD Pad, 5x9 Discharge Instruction: Apply over primary dressing as directed. Woven Gauze Sponge, Non-Sterile 4x4 in Crane [name] ([id number]) [id number].pdf Page 4 of 8 Discharge Instruction: Apply over primary dressing as directed. Secured With Compression Wrap ThreePress (3 layer compression wrap) Discharge Instruction: Apply three layer compression as directed. Compression Stockings Add-Ons Wound #4 (Lower Leg) Wound Laterality: Left, Anterior Cleanser Peri-Wound Care Topical Primary Dressing Secondary Dressing Secured With Compression Wrap Compression Stockings Add-Ons Electronic Signature(s) Signed: 11/24/2022 3:36:23 PM By: Fredirick Maudlin MD FACS Entered By: Fredirick Maudlin on 11/24/2022 15:36:22 -------------------------------------------------------------------------------- Multi-Disciplinary Care Plan Details Patient Name: Date of Service: [name], [name] [name] 11/24/2022 2:45 PM Medical Record Number: [id number] Patient Account Number: [id number] Date of Birth/Sex: Treating RN: [birth year] (87 y.o. [name] Primary Care Lorenza Shakir: [name] Other Clinician: Referring Kelwin Gibler: Treating Ireta Pullman/Extender: [name] in Treatment: 15 Active Inactive Necrotic Tissue Nursing Diagnoses: Impaired tissue integrity related to necrotic/devitalized tissue  Knowledge deficit related to management of necrotic/devitalized tissue Goals: Necrotic/devitalized tissue will be minimized in the wound bed Date Initiated: 08/25/2022 Target Resolution Date: 12/18/2022 Goal Status: Active Patient/caregiver will verbalize understanding of reason and process for debridement of necrotic tissue Date Initiated: 08/25/2022 Target Resolution Date: 12/18/2022 Goal  Status: Active Interventions: Assess patient pain level pre-, during and post procedure and prior to discharge Provide education on necrotic tissue and debridement process Treatment Activities: Apply topical anesthetic as ordered : 08/25/2022 Notes: Electronic Signature(s) [name], [name] ([id number]) [id number] Page 5 of 8 Signed: 11/24/2022 3:52:49 PM By: [name] By: [name] on 11/24/2022 14:50:39 -------------------------------------------------------------------------------- Pain Assessment Details Patient Name: Date of Service: [name], [name] [name] 11/24/2022 2:45 PM Medical Record Number: [id number] Patient Account Number: [id number] Date of Birth/Sex: Treating RN: [birth year] (87 y.o. [name] Primary Care Opie Maclaughlin: [name] Other Clinician: Referring Leighann Amadon: Treating Sherine Cortese/Extender: [name] in Treatment: 15 Active Problems Location of Pain Severity and Description of Pain Patient Has Paino No Site Locations Rate the pain. Current Pain Level: 0 Pain Management and Medication Current Pain Management: Electronic Signature(s) Signed: 11/24/2022 3:52:49 PM By: [name] Entered By: [name] on 11/24/2022 14:32:22 -------------------------------------------------------------------------------- Patient/Caregiver Education Details Patient Name: Date of Service: [name], [name] [name] 3/5/2024andnbsp2:45 PM Medical Record Number: [id number] Patient Account Number: [id number] Date of Birth/Gender: Treating RN: 1936-01-01 (87 y.o. [name] Primary Care Physician: [name] Other Clinician: Referring Physician: Treating Physician/Extender: [name] in Treatment: 15 Education Assessment Education Provided To: Patient Education Topics Provided Wound/Skin Impairment: Methods: Explain/Verbal Responses: Reinforcements needed, State  content correctly Electronic Signature(s) Signed: 11/24/2022 3:52:49 PM By: [name] ([id number]) [id number] Page 6 of 8 Entered By: [name] on 11/24/2022 14:50:51 -------------------------------------------------------------------------------- Wound Assessment Details Patient Name: Date of Service: [name], [name] [name] 11/24/2022 2:45 PM Medical Record Number: [id number] Patient Account Number: [id number] Date of Birth/Sex: Treating RN: 10-Aug-1936 (87 y.o. [name] Primary Care Loman Logan: [name] Other Clinician: Referring Deaysia Grigoryan: Treating Starling Christofferson/Extender: [name] in Treatment: 15 Wound Status Wound Number: 3 Primary Lesion Etiology: Wound Location: Left, Posterior Lower Leg Wound Open Wounding Event: Puncture Status: Date Acquired: 08/12/2022 Comorbid Glaucoma, Arrhythmia, Coronary Artery Disease, Hypertension, Weeks Of Treatment: 13 History: Peripheral Venous Disease, Osteoarthritis Clustered Wound: No Photos Wound Measurements Length: (cm) 2.1 Width: (cm) 1.4 Depth: (cm) 0.2 Area: (cm) 2.309 Volume: (cm) 0.462 % Reduction in Area: -3152.1% % Reduction in Volume: -3200% Epithelialization: Small (1-33%) Tunneling: No Undermining: No Wound Description Classification: Full Thickness Without Exposed Support Structures Wound Margin: Distinct, outline attached Exudate Amount: Medium Exudate Type: Serosanguineous Exudate Color: red, brown Foul Odor After Cleansing: No Slough/Fibrino Yes Wound Bed Granulation Amount: Medium (34-66%) Exposed Structure Granulation Quality: Red Fascia Exposed: No Necrotic Amount: Medium (34-66%) Fat Layer (Subcutaneous Tissue) Exposed: Yes Necrotic Quality: Eschar, Adherent Slough Tendon Exposed: No Muscle Exposed: No Joint Exposed: No Bone Exposed: No Periwound Skin Texture Texture Color No Abnormalities Noted: Yes No  Abnormalities Noted: No Hemosiderin Staining: Yes Moisture No Abnormalities Noted: Yes Temperature / Pain Temperature: No Abnormality Treatment Notes Wound #3 (Lower Leg) Wound Laterality: Left, Posterior Cleanser Soap and Water Discharge Instruction: May shower and wash wound with dial antibacterial soap and water prior to dressing change. [name], [name] ([id number]) [id number] Page 7 of 8 Wound Cleanser Discharge Instruction: Cleanse the wound with wound cleanser prior to applying a clean dressing using gauze sponges,  not tissue or cotton balls. Peri-Wound Care Sween Lotion (Moisturizing lotion) Discharge Instruction: Apply moisturizing lotion as directed Topical Primary Dressing IODOFLEX 0.9% Cadexomer Iodine Pad 4x6 cm Discharge Instruction: Apply to wound bed as instructed Secondary Dressing ABD Pad, 5x9 Discharge Instruction: Apply over primary dressing as directed. Woven Gauze Sponge, Non-Sterile 4x4 in Discharge Instruction: Apply over primary dressing as directed. Secured With Compression Wrap ThreePress (3 layer compression wrap) Discharge Instruction: Apply three layer compression as directed. Compression Stockings Add-Ons Electronic Signature(s) Signed: 11/24/2022 3:52:49 PM By: [name] Entered By: [name] on 11/24/2022 14:41:20 -------------------------------------------------------------------------------- Wound Assessment Details Patient Name: Date of Service: [name], [name] [name] 11/24/2022 2:45 PM Medical Record Number: [id number] Patient Account Number: [id number] Date of Birth/Sex: Treating RN: May 16, 1936 (87 y.o. [name] Primary Care Tia Hieronymus: [name] Other Clinician: Referring Nastasha Reising: Treating Kollin Udell/Extender: [name] in Treatment: 15 Wound Status Wound Number: 4 Primary Lymphedema Etiology: Wound Location: Left, Anterior Lower Leg Wound Open Wounding Event:  Gradually Appeared Status: Date Acquired: 11/06/2022 Comorbid Glaucoma, Arrhythmia, Coronary Artery Disease, Hypertension, Weeks Of Treatment: 2 History: Peripheral Venous Disease, Osteoarthritis Clustered Wound: No Photos Wound Measurements Length: (cm) 0 Width: (cm) 0 Depth: (cm) 0 Area: (cm) 0 Volume: (cm) 0 [name], [name] ([id number]) Wound Description Classification: Full Thickness Without Exposed Support Structures Wound Margin: Distinct, outline attached Exudate Amount: None Present Foul Odor After Cleansing: No Slough/Fibrino No % Reduction in Area: 100% % Reduction in Volume: 100% Epithelialization: Large (67-100%) Tunneling: No Undermining: No [phone].pdf Page 8 of 8 Wound Bed Granulation Amount: None Present (0%) Exposed Structure Necrotic Amount: None Present (0%) Fascia Exposed: No Fat Layer (Subcutaneous Tissue) Exposed: No Tendon Exposed: No Muscle Exposed: No Joint Exposed: No Bone Exposed: No Periwound Skin Texture Texture Color No Abnormalities Noted: No No Abnormalities Noted: Yes Scarring: Yes Temperature / Pain Temperature: No Abnormality Moisture No Abnormalities Noted: No Dry / Scaly: Yes Electronic Signature(s) Signed: 11/24/2022 3:52:49 PM By: [name] Entered By: [name] on 11/24/2022 14:41:41 -------------------------------------------------------------------------------- [place] Details Patient Name: Date of Service: [name], [name] [name] 11/24/2022 2:45 PM Medical Record Number: [id number] Patient Account Number: [id number] Date of Birth/Sex: Treating RN: [birth year] (87 y.o. [name] Primary Care Reema Chick: [name] Other Clinician: Referring Fortune Brannigan: Treating Saulo Anthis/Extender: [name] in Treatment: 15 Vital Signs Time Taken: 14:32 Temperature (F): 97.8 Pulse (bpm): 65 Respiratory Rate (breaths/min): 16 Blood Pressure (mmHg): 127/74 Reference  Range: 80 - 120 mg / dl Electronic Signature(s) Signed: 11/24/2022 3:52:49 PM By: [name] Entered By: [name] on 11/24/2022 14:32:52

## 2022-11-26 NOTE — Progress Notes (Signed)
[name], [name] ([id number]) [id number] Page 1 of 10 Visit Report for 11/24/2022 Chief Complaint Document Details Patient Name: Date of Service: [name] 11/24/2022 2:45 PM Medical Record Number: [id number] Patient Account Number: [id number] Date of Birth/Sex: Treating RN: 1935-10-05 (87 y.o. M) Primary Care Provider: Kathlene [name] Other Clinician: Referring Provider: Treating Provider/Extender: [name] in Treatment: 15 Information Obtained from: Patient Chief Complaint Left lower extremity wound Electronic Signature(s) Signed: 11/24/2022 3:36:29 PM By: [name] Maudlin MD FACS Entered By: [name] on 11/24/2022 15:36:29 -------------------------------------------------------------------------------- Debridement Details Patient Name: Date of Service: [name] 11/24/2022 2:45 PM Medical Record Number: [id number] Patient Account Number: [id number] Date of Birth/Sex: Treating RN: June 08, 1936 (87 y.o. [name] Primary Care Provider: Kathlene [name] Other Clinician: Referring Provider: Treating Provider/Extender: [name] in Treatment: 15 Debridement Performed for Assessment: Wound #3 Left,Posterior Lower Leg Performed By: Physician [name] Maudlin, MD Debridement Type: Debridement Level of Consciousness (Pre-procedure): Awake and Alert Pre-procedure Verification/Time Out Yes - 14:48 Taken: Start Time: 14:48 Pain Control: Lidocaine 4% T opical Solution T Area Debrided (L x W): otal 2.1 (cm) x 1.4 (cm) = 2.94 (cm) Tissue and other material debrided: Non-Viable, Eschar, Slough, Subcutaneous, Slough Level: Skin/Subcutaneous Tissue Debridement Description: Excisional Instrument: Curette Bleeding: Minimum Hemostasis Achieved: Pressure Response to Treatment: Procedure was tolerated well Level of Consciousness (Post- Awake and Alert procedure): Post Debridement Measurements of Total  Wound Length: (cm) 2.1 Width: (cm) 1.4 Depth: (cm) 0.2 Volume: (cm) 0.462 Character of Wound/Ulcer Post Debridement: Improved Post Procedure Diagnosis Same as Pre-procedure Notes scribed for Dr. Celine [name] by [name] Peals, RN Electronic Signature(s) Signed: 11/24/2022 3:52:49 PM By: [name] Signed: 11/25/2022 8:23:25 AM By: [name] Maudlin MD FACS Entered By: [name] on 11/24/2022 14:51:34 [name], [name] ([id number]) [id number] Page 2 of 10 -------------------------------------------------------------------------------- HPI Details Patient Name: Date of Service: [name] 11/24/2022 2:45 PM Medical Record Number: [id number] Patient Account Number: [id number] Date of Birth/Sex: Treating RN: 1935-12-20 (87 y.o. M) Primary Care Provider: Kathlene [name] Other Clinician: Referring Provider: Treating Provider/Extender: [name] in Treatment: 15 History of Present Illness HPI Description: [name] is an 87 year old male with a past medical history of CAD s/p DES to LCx 2006, carotid artery disease, hypertension, hyperlipidemia, paroxysmal atrial fibrillation on Eliquis and Cardizem that presents today for right lower extremity wound. He was seen by [name], his primary care provider on 4/29 for this issue and was referred to our clinic. Patient states that he had a small wound that spontaneously started in October 2021 and has not healed. He states this has progressively gotten larger. He has been using acne cream prescribed by the dermatologist for this issue. He reports drainage to the wound but no purulent drainage. He reports mild soreness to the wound. He has swelling in his right leg greater than left and reports this is a chronic issue for the past 1 to 2 years. He denies resting leg pain or pain with ambulation. Of note He was prescribed doxycycline at the end of last month and has finished this course for  possible skin infection to the wound site. 02/06/2021; I am seeing this patient who was admitted to the clinic last week by [name] He has predominantly I think a venous insufficiency wound on the right medial lower leg he has been using Santyl Hydrofera Blue under 3 layer compression. Arterial studies were ordered last week but do not  seem to been put through. He is not a diabetic. He did have venous reflux studies done in August 2020. He did have abnormal reflux time was noted in the great saphenous vein in the distal thigh and the great saphenous vein at the knee. There was no evidence of DVT or SVT at the time he was not felt to have large enough saphenous veins on either side for intervention. Compression stockings at least knee-high were recommended. Follow-up was on a as needed basis with Dr. Donzetta [name] The patient does not describe claudication but his pulses in his feet are not vibrant this could be because of the swelling 5/26; patient presents for 1 week follow-up. He has been using Hydrofera Blue under 3 layer compression. He had arterial studies done. He has no issues or complaints today. He denies signs of infection. He has his juxta light compressions with him today. 6/3; patient presents for 1 week follow-up. He has been using Hydrofera Blue under 3 layer compression. He denies signs and symptoms of infection. He did have bright green drainage which he states he has not seen before when the dressing was taken off today. He is using his juxta light compression to the left leg. 6/10; patient presents for 1 week follow-up. He has been tolerating Hydrofera Blue with 3 layer compression. He again reports bright green drainage. However he denies any signs of infection. He has been using juxta light compression to the left leg. 6/24; patient presents for 2-week follow-up. He has been tolerating Hydrofera Blue with 3 layer compression. Today he is healed. He brought his juxta lite compression  for the right leg and continues to wear the juxta light compression on the left leg READMISSION 05/05/2022 The patient returns to clinic today with a new wound on his left posterior leg. He has been wearing his juxta lite stockings on a regular basis, but on exam he still has 3+ pitting edema. I suspect he has lymphedema in addition to his known venous reflux. The wounds are scattered geographic wounds with light slough accumulation. They have been applying mupirocin and CeraVe ointment. He does have a VP shunt placement scheduled for next week, but it sounds like his neurosurgeon is not aware of the fact that he has an open wound. 05/11/2022: His VP shunt placement has been postponed while we get his wound to heal. The wounds are all smaller today with just a little eschar and minimal slough. Edema control is good. No concern for infection. 05/18/2022: All of the open areas have contracted and there is good perimeter epithelialization. There is a little bit of eschar and slough on the open areas. Good edema control. No concern for infection. 05/26/2022: Many of the small open areas have closed. There is Hydrofera Blue sponge stuck in a number of places and this is unable to be removed by the intake nurse. There is a fair amount of eschar and a little bit of slough present. 06/02/2022: Continued closure of small open skin sites. There are just a couple remaining and these have a little eschar on the surface. Edema control is excellent. 06/09/2022: We are down to just 1 small open area. There is some eschar and slough present. No concern for infection. Edema control is excellent. 06/16/2022: His wound is healed. READMISSION 08/11/2022 Mr. Short underwent a successful VP shunt placement. He returns to clinic today because of a suspicious lesion on his left posterior calf. He actually has no open wounds and his edema control is excellent.  He is wearing his juxta lite stockings religiously. On his left  posterior calf, there is a raised scaly lesion concerning for potential squamous cell carcinoma. 08/25/2022: The biopsy that I took at his last visit was consistent with a well-differentiated squamous cell carcinoma. He has an appointment at the skin surgery center on December 14. The wound from the biopsy has some slough accumulation, but no concern for infection. 11/06/2022: Since his last visit here, he has undergone Mohs surgery for the squamous cell carcinoma that I biopsied. He has a fairly substantial and deep defect. Apparently the procedure took place between 10 to 12 days ago and he has been in an [employer] ever since. When his Unna boot was removed, he was found to have crusting on his anterior tibial surface with small superficial wounds underneath. 11/16/2022: The anterior tibial surface wounds seem to largely be closed with just a couple of tiny open areas under some eschar. The Mohs defect is quite a bit cleaner, but still with nonviable subcutaneous tissue present along with slough and eschar, much of which is dried up Iodoflex. 11/24/2022: The anterior tibial wounds are closed. The Mohs defect is cleaner again this week with much less nonviable tissue. Electronic Signature(s) [name], [name] ([id number]) [id number] Page 3 of 10 Signed: 11/24/2022 3:37:00 PM By: [name] Maudlin MD FACS Entered By: [name] on 11/24/2022 15:37:00 -------------------------------------------------------------------------------- Physical Exam Details Patient Name: Date of Service: [name] 11/24/2022 2:45 PM Medical Record Number: [id number] Patient Account Number: [id number] Date of Birth/Sex: Treating RN: 13-Jul-1936 (87 y.o. M) Primary Care Provider: Kathlene [name] Other Clinician: Referring Provider: Treating Provider/Extender: [name] in Treatment: 15 Constitutional . . . . no acute distress. Respiratory Normal work of breathing on  room air. Notes 11/24/2022: The anterior tibial wounds are closed. The Mohs defect is cleaner again this week with much less nonviable tissue. Electronic Signature(s) Signed: 11/25/2022 7:48:40 AM By: [name] Maudlin MD FACS Entered By: [name] on 11/25/2022 07:48:40 -------------------------------------------------------------------------------- Physician Orders Details Patient Name: Date of Service: [name] 11/24/2022 2:45 PM Medical Record Number: [id number] Patient Account Number: [id number] Date of Birth/Sex: Treating RN: 04-Dec-1935 (87 y.o. [name] Primary Care Provider: Kathlene [name] Other Clinician: Referring Provider: Treating Provider/Extender: [name] in Treatment: 15 Verbal / Phone Orders: No Diagnosis Coding ICD-10 Coding Code Description [phone] Non-pressure chronic ulcer of left calf with fat layer exposed L98.9 Disorder of the skin and subcutaneous tissue, unspecified I87.2 Venous insufficiency (chronic) (peripheral) I10 Essential (primary) hypertension Follow-up Appointments ppointment in 1 week. - Dr. Celine [name] - room 2 Return A Anesthetic (In clinic) Topical Lidocaine 4% applied to wound bed Bathing/ Shower/ Hygiene May shower with protection but do not get wound dressing(s) wet. Protect dressing(s) with water repellant cover (for example, large plastic bag) or a cast cover and may then take shower. Edema Control - Lymphedema / SCD / Other Elevate legs to the level of the heart or above for 30 minutes daily and/or when sitting for 3-4 times a day throughout the day. Avoid standing for long periods of time. Exercise regularly - including ankle circles while sitting Moisturize legs daily. - both legs nightly after removing juxtalites Compression stocking or Garment 20-30 mm/Hg pressure to: - both legs daily Wound Treatment Wound #3 - Lower Leg Wound Laterality: Left, Posterior Cleanser: Soap and Water 1 x Per  Day/30 Days Discharge Instructions: May shower and wash wound with dial antibacterial soap  and water prior to dressing change. Cleanser: Wound Cleanser 1 x Per Day/30 Days TORION, SHAAK ([id number]) [id number] Page 4 of 10 Discharge Instructions: Cleanse the wound with wound cleanser prior to applying a clean dressing using gauze sponges, not tissue or cotton balls. Peri-Wound Care: Sween Lotion (Moisturizing lotion) 1 x Per Day/30 Days Discharge Instructions: Apply moisturizing lotion as directed Prim Dressing: IODOFLEX 0.9% Cadexomer Iodine Pad 4x6 cm 1 x Per Day/30 Days ary Discharge Instructions: Apply to wound bed as instructed Secondary Dressing: ABD Pad, 5x9 1 x Per Day/30 Days Discharge Instructions: Apply over primary dressing as directed. Secondary Dressing: Woven Gauze Sponge, Non-Sterile 4x4 in 1 x Per Day/30 Days Discharge Instructions: Apply over primary dressing as directed. Compression Wrap: ThreePress (3 layer compression wrap) 1 x Per Day/30 Days Discharge Instructions: Apply three layer compression as directed. Patient Medications llergies: No Known Allergies A Notifications Medication Indication Start End 11/24/2022 lidocaine DOSE topical 4 % cream - cream topical Electronic Signature(s) Signed: 11/25/2022 8:23:25 AM By: [name] Maudlin MD FACS Previous Signature: 11/24/2022 3:52:49 PM Version By: [name] Entered By: [name] on 11/25/2022 07:51:27 -------------------------------------------------------------------------------- Problem List Details Patient Name: Date of Service: [name] 11/24/2022 2:45 PM Medical Record Number: [id number] Patient Account Number: [id number] Date of Birth/Sex: Treating RN: 04/19/1936 (87 y.o. M) Primary Care Provider: Kathlene [name] Other Clinician: Referring Provider: Treating Provider/Extender: [name] in Treatment: 15 Active  Problems ICD-10 Encounter Code Description Active Date MDM Diagnosis L97.222 Non-pressure chronic ulcer of left calf with fat layer exposed 11/06/2022 No Yes L98.9 Disorder of the skin and subcutaneous tissue, unspecified 08/11/2022 No Yes I87.2 Venous insufficiency (chronic) (peripheral) 08/11/2022 No Yes I10 Essential (primary) hypertension 08/11/2022 No Yes Inactive Problems Resolved Problems ICD-10 Code Description Active Date Resolved Date L97.821 Non-pressure chronic ulcer of other part of left lower leg limited to breakdown of skin 11/06/2022 11/06/2022 [name], [name] ([id number]) [phone].pdf Page 5 of 10 Electronic Signature(s) Signed: 11/24/2022 3:36:15 PM By: [name] Maudlin MD FACS Entered By: [name] on 11/24/2022 15:36:15 -------------------------------------------------------------------------------- Progress Note Details Patient Name: Date of Service: [name] 11/24/2022 2:45 PM Medical Record Number: [id number] Patient Account Number: [id number] Date of Birth/Sex: Treating RN: 02-05-1936 (87 y.o. M) Primary Care Provider: Kathlene [name] Other Clinician: Referring Provider: Treating Provider/Extender: [name] in Treatment: 15 Subjective Chief Complaint Information obtained from Patient Left lower extremity wound History of Present Illness (HPI) [name] is an 87 year old male with a past medical history of CAD s/p DES to LCx 2006, carotid artery disease, hypertension, hyperlipidemia, paroxysmal atrial fibrillation on Eliquis and Cardizem that presents today for right lower extremity wound. He was seen by [name], his primary care provider on 4/29 for this issue and was referred to our clinic. Patient states that he had a small wound that spontaneously started in October 2021 and has not healed. He states this has progressively gotten larger. He has been using acne cream prescribed by the dermatologist  for this issue. He reports drainage to the wound but no purulent drainage. He reports mild soreness to the wound. He has swelling in his right leg greater than left and reports this is a chronic issue for the past 1 to 2 years. He denies resting leg pain or pain with ambulation. Of note He was prescribed doxycycline at the end of last month and has finished this course for possible skin infection to the wound site. 02/06/2021; I am  seeing this patient who was admitted to the clinic last week by Dr. He[place]. He has predominantly I think a venous insufficiency wound on the right medial lower leg he has been using Santyl Hydrofera Blue under 3 layer compression. Arterial studies were ordered last week but do not seem to been put through. He is not a diabetic. He did have venous reflux studies done in August 2020. He did have abnormal reflux time was noted in the great saphenous vein in the distal thigh and the great saphenous vein at the knee. There was no evidence of DVT or SVT at the time he was not felt to have large enough saphenous veins on either side for intervention. Compression stockings at least knee-high were recommended. Follow-up was on a as needed basis with Dr. Donzetta [name] The patient does not describe claudication but his pulses in his feet are not vibrant this could be because of the swelling 5/26; patient presents for 1 week follow-up. He has been using Hydrofera Blue under 3 layer compression. He had arterial studies done. He has no issues or complaints today. He denies signs of infection. He has his juxta light compressions with him today. 6/3; patient presents for 1 week follow-up. He has been using Hydrofera Blue under 3 layer compression. He denies signs and symptoms of infection. He did have bright green drainage which he states he has not seen before when the dressing was taken off today. He is using his juxta light compression to the left leg. 6/10; patient presents for 1 week  follow-up. He has been tolerating Hydrofera Blue with 3 layer compression. He again reports bright green drainage. However he denies any signs of infection. He has been using juxta light compression to the left leg. 6/24; patient presents for 2-week follow-up. He has been tolerating Hydrofera Blue with 3 layer compression. Today he is healed. He brought his juxta lite compression for the right leg and continues to wear the juxta light compression on the left leg READMISSION 05/05/2022 The patient returns to clinic today with a new wound on his left posterior leg. He has been wearing his juxta lite stockings on a regular basis, but on exam he still has 3+ pitting edema. I suspect he has lymphedema in addition to his known venous reflux. The wounds are scattered geographic wounds with light slough accumulation. They have been applying mupirocin and CeraVe ointment. He does have a VP shunt placement scheduled for next week, but it sounds like his neurosurgeon is not aware of the fact that he has an open wound. 05/11/2022: His VP shunt placement has been postponed while we get his wound to heal. The wounds are all smaller today with just a little eschar and minimal slough. Edema control is good. No concern for infection. 05/18/2022: All of the open areas have contracted and there is good perimeter epithelialization. There is a little bit of eschar and slough on the open areas. Good edema control. No concern for infection. 05/26/2022: Many of the small open areas have closed. There is Hydrofera Blue sponge stuck in a number of places and this is unable to be removed by the intake nurse. There is a fair amount of eschar and a little bit of slough present. 06/02/2022: Continued closure of small open skin sites. There are just a couple remaining and these have a little eschar on the surface. Edema control is excellent. 06/09/2022: We are down to just 1 small open area. There is some eschar and slough present. No  concern for infection. Edema control is excellent. 06/16/2022: His wound is healed. READMISSION 08/11/2022 Mr. Short underwent a successful VP shunt placement. He returns to clinic today because of a suspicious lesion on his left posterior calf. He actually has no open wounds and his edema control is excellent. He is wearing his juxta lite stockings religiously. On his left posterior calf, there is a raised scaly lesion concerning for potential squamous cell carcinoma. [name], [name] ([id number]) [id number] Page 6 of 10 08/25/2022: The biopsy that I took at his last visit was consistent with a well-differentiated squamous cell carcinoma. He has an appointment at the skin surgery center on December 14. The wound from the biopsy has some slough accumulation, but no concern for infection. 11/06/2022: Since his last visit here, he has undergone Mohs surgery for the squamous cell carcinoma that I biopsied. He has a fairly substantial and deep defect. Apparently the procedure took place between 10 to 12 days ago and he has been in an [employer] ever since. When his Unna boot was removed, he was found to have crusting on his anterior tibial surface with small superficial wounds underneath. 11/16/2022: The anterior tibial surface wounds seem to largely be closed with just a couple of tiny open areas under some eschar. The Mohs defect is quite a bit cleaner, but still with nonviable subcutaneous tissue present along with slough and eschar, much of which is dried up Iodoflex. 11/24/2022: The anterior tibial wounds are closed. The Mohs defect is cleaner again this week with much less nonviable tissue. Patient History Information obtained from Patient. Family History Cancer - Father, Diabetes - Maternal Grandparents, Heart Disease - Maternal Grandparents, Hypertension - Maternal Grandparents, No family history of Hereditary Spherocytosis, Kidney Disease, Lung Disease, Seizures, Stroke,  Thyroid Problems, Tuberculosis. Social History Former smoker - Quit over 30 years ago, Marital Status - Married, Alcohol Use - Rarely, Drug Use - No History, Caffeine Use - Daily. Medical History Eyes Patient has history of Glaucoma Cardiovascular Patient has history of Arrhythmia - afib, Coronary Artery Disease, Hypertension, Peripheral Venous Disease Endocrine Denies history of Type I Diabetes, Type II Diabetes Genitourinary Denies history of End Stage Renal Disease Integumentary (Skin) Denies history of History of Burn Musculoskeletal Patient has history of Osteoarthritis Oncologic Denies history of Received Chemotherapy, Received Radiation Psychiatric Denies history of Anorexia/bulimia, Confinement Anxiety Hospitalization/Surgery History - lumbar laminectomy. - total knee arthropathy right. - bil total hip arthroplasty. - coronary stents. - toe surgery. - tonsillectomy. - shunt insertion. Medical A Surgical History Notes nd Gastrointestinal colon polyps Endocrine Hypothyroidism Genitourinary BPH Musculoskeletal Bilat Hip Replacements, Right Knee Replacement, lumbar stenosis Neurologic hydrocephalus Oncologic Skin Cancer multiple sites Objective Constitutional no acute distress. Vitals Time Taken: 2:32 PM, Temperature: 97.8 F, Pulse: 65 bpm, Respiratory Rate: 16 breaths/min, Blood Pressure: 127/74 mmHg. Respiratory Normal work of breathing on room air. General Notes: 11/24/2022: The anterior tibial wounds are closed. The Mohs defect is cleaner again this week with much less nonviable tissue. Integumentary (Hair, Skin) Wound #3 status is Open. Original cause of wound was Puncture. The date acquired was: 08/12/2022. The wound has been in treatment 13 weeks. The wound is located on the Left,Posterior Lower Leg. The wound measures 2.1cm length x 1.4cm width x 0.2cm depth; 2.309cm^2 area and 0.462cm^3 volume. There is Fat Layer (Subcutaneous Tissue) exposed. There is no  tunneling or undermining noted. There is a medium amount of serosanguineous drainage noted. The wound margin is distinct with the outline attached to the wound  base. There is medium (34-66%) red granulation within the wound bed. There is a medium (34-66%) amount of necrotic tissue within the wound bed including Eschar and Adherent Slough. The periwound skin appearance had no abnormalities noted for texture. The periwound skin appearance had no abnormalities noted for moisture. The periwound skin appearance exhibited: Hemosiderin Staining. Periwound temperature was noted as No Abnormality. [name], [name] ([id number]) [id number] Page 7 of 10 Wound #4 status is Open. Original cause of wound was Gradually Appeared. The date acquired was: 11/06/2022. The wound has been in treatment 2 weeks. The wound is located on the Left,Anterior Lower Leg. The wound measures 0cm length x 0cm width x 0cm depth; 0cm^2 area and 0cm^3 volume. There is no tunneling or undermining noted. There is a none present amount of drainage noted. The wound margin is distinct with the outline attached to the wound base. There is no granulation within the wound bed. There is no necrotic tissue within the wound bed. The periwound skin appearance had no abnormalities noted for color. The periwound skin appearance exhibited: Scarring, Dry/Scaly. Periwound temperature was noted as No Abnormality. Assessment Active Problems ICD-10 Non-pressure chronic ulcer of left calf with fat layer exposed Disorder of the skin and subcutaneous tissue, unspecified Venous insufficiency (chronic) (peripheral) Essential (primary) hypertension Procedures Wound #3 Pre-procedure diagnosis of Wound #3 is a Lesion located on the Left,Posterior Lower Leg . There was a Excisional Skin/Subcutaneous Tissue Debridement with a total area of 2.94 sq cm performed by [name] Maudlin, MD. With the following instrument(s): Curette to remove  Non-Viable tissue/material. Material removed includes Eschar, Subcutaneous Tissue, and Slough after achieving pain control using Lidocaine 4% T opical Solution. No specimens were taken. A time out was conducted at 14:48, prior to the start of the procedure. A Minimum amount of bleeding was controlled with Pressure. The procedure was tolerated well. Post Debridement Measurements: 2.1cm length x 1.4cm width x 0.2cm depth; 0.462cm^3 volume. Character of Wound/Ulcer Post Debridement is improved. Post procedure Diagnosis Wound #3: Same as Pre-Procedure General Notes: scribed for Dr. Celine [name] by [name] Peals, RN. Pre-procedure diagnosis of Wound #3 is a Lesion located on the Left,Posterior Lower Leg . There was a Three Layer Compression Therapy Procedure by [name] Peals, RN. Post procedure Diagnosis Wound #3: Same as Pre-Procedure Plan Follow-up Appointments: Return Appointment in 1 week. - Dr. Celine [name] - room 2 Anesthetic: (In clinic) Topical Lidocaine 4% applied to wound bed Bathing/ Shower/ Hygiene: May shower with protection but do not get wound dressing(s) wet. Protect dressing(s) with water repellant cover (for example, large plastic bag) or a cast cover and may then take shower. Edema Control - Lymphedema / SCD / Other: Elevate legs to the level of the heart or above for 30 minutes daily and/or when sitting for 3-4 times a day throughout the day. Avoid standing for long periods of time. Exercise regularly - including ankle circles while sitting Moisturize legs daily. - both legs nightly after removing juxtalites Compression stocking or Garment 20-30 mm/Hg pressure to: - both legs daily The following medication(s) was prescribed: lidocaine topical 4 % cream cream topical was prescribed at facility WOUND #3: - Lower Leg Wound Laterality: Left, Posterior Cleanser: Soap and Water 1 x Per Day/30 Days Discharge Instructions: May shower and wash wound with dial antibacterial soap and  water prior to dressing change. Cleanser: Wound Cleanser 1 x Per Day/30 Days Discharge Instructions: Cleanse the wound with wound cleanser prior to applying a clean dressing using gauze sponges, not tissue or  cotton balls. Peri-Wound Care: Sween Lotion (Moisturizing lotion) 1 x Per Day/30 Days Discharge Instructions: Apply moisturizing lotion as directed Prim Dressing: IODOFLEX 0.9% Cadexomer Iodine Pad 4x6 cm 1 x Per Day/30 Days ary Discharge Instructions: Apply to wound bed as instructed Secondary Dressing: ABD Pad, 5x9 1 x Per Day/30 Days Discharge Instructions: Apply over primary dressing as directed. Secondary Dressing: Woven Gauze Sponge, Non-Sterile 4x4 in 1 x Per Day/30 Days Discharge Instructions: Apply over primary dressing as directed. Com pression Wrap: ThreePress (3 layer compression wrap) 1 x Per Day/30 Days Discharge Instructions: Apply three layer compression as directed. 11/24/2022: The anterior tibial wounds are closed. The Mohs defect is cleaner again this week with much less nonviable tissue. I used a curette to debride slough, eschar, and subcutaneous tissue from the Mohs defect wound. I think 1 more week of Iodoflex should get this clean enough that we can move on to something like silver alginate. Continue 3 layer compression. Follow-up in 1 week. [name], [name] ([id number]) [id number] Page 8 of 10 Electronic Signature(s) Signed: 11/25/2022 7:52:07 AM By: [name] Maudlin MD FACS Entered By: [name] on 11/25/2022 07:52:07 -------------------------------------------------------------------------------- HxROS Details Patient Name: Date of Service: [name] 11/24/2022 2:45 PM Medical Record Number: [id number] Patient Account Number: [id number] Date of Birth/Sex: Treating RN: 09-Nov-1935 (87 y.o. M) Primary Care Provider: Kathlene [name] Other Clinician: Referring Provider: Treating Provider/Extender: [name]  in Treatment: 15 Information Obtained From Patient Eyes Medical History: Positive for: Glaucoma Cardiovascular Medical History: Positive for: Arrhythmia - afib; Coronary Artery Disease; Hypertension; Peripheral Venous Disease Gastrointestinal Medical History: Past Medical History Notes: colon polyps Endocrine Medical History: Negative for: Type I Diabetes; Type II Diabetes Past Medical History Notes: Hypothyroidism Genitourinary Medical History: Negative for: End Stage Renal Disease Past Medical History Notes: BPH Integumentary (Skin) Medical History: Negative for: History of Burn Musculoskeletal Medical History: Positive for: Osteoarthritis Past Medical History Notes: Bilat Hip Replacements, Right Knee Replacement, lumbar stenosis Neurologic Medical History: Past Medical History Notes: hydrocephalus Oncologic Medical History: Negative for: Received Chemotherapy; Received Radiation Past Medical History Notes: Skin Cancer multiple sites Psychiatric Medical History: TAIKI, ANGELINA ([id number]) [id number] Page 9 of 10 Negative for: Anorexia/bulimia; Confinement Anxiety HBO Extended History Items Eyes: Glaucoma Immunizations Pneumococcal Vaccine: Received Pneumococcal Vaccination: Yes Received Pneumococcal Vaccination On or After 60th Birthday: Yes Implantable Devices None Hospitalization / Surgery History Type of Hospitalization/Surgery lumbar laminectomy total knee arthropathy right bil total hip arthroplasty coronary stents toe surgery tonsillectomy shunt insertion Family and Social History Cancer: Yes - Father; Diabetes: Yes - Maternal Grandparents; Heart Disease: Yes - Maternal Grandparents; Hereditary Spherocytosis: No; Hypertension: Yes - Maternal Grandparents; Kidney Disease: No; Lung Disease: No; Seizures: No; Stroke: No; Thyroid Problems: No; Tuberculosis: No; Former smoker - Quit over 30 years ago; Marital Status -  Married; Alcohol Use: Rarely; Drug Use: No History; Caffeine Use: Daily; Financial Concerns: No; Food, Clothing or Shelter Needs: No; Support System Lacking: No; Transportation Concerns: No Electronic Signature(s) Signed: 11/25/2022 8:23:25 AM By: [name] Maudlin MD FACS Entered By: [name] on 11/25/2022 07:48:12 -------------------------------------------------------------------------------- SuperBill Details Patient Name: Date of Service: [name] 11/24/2022 Medical Record Number: [id number] Patient Account Number: [id number] Date of Birth/Sex: Treating RN: 04-25-1936 (87 y.o. M) Primary Care Provider: Kathlene [name] Other Clinician: Referring Provider: Treating Provider/Extender: [name] in Treatment: 15 Diagnosis Coding ICD-10 Codes Code Description [phone] Non-pressure chronic ulcer of left calf with fat layer exposed L98.9 Disorder of  the skin and subcutaneous tissue, unspecified I87.2 Venous insufficiency (chronic) (peripheral) I10 Essential (primary) hypertension Facility Procedures : CPT4 Code: [id number] Description: [id number] - DEB SUBQ TISSUE 20 SQ CM/< ICD-10 Diagnosis Description L97.222 Non-pressure chronic ulcer of left calf with fat layer exposed Modifier: Quantity: 1 Physician Procedures : CPT4 Code Description Modifier [id number] - WC PHYS LEVEL 3 - EST PT 25 ICD-10 Diagnosis Description L97.222 Non-pressure chronic ulcer of left calf with fat layer exposed L98.9 Disorder of the skin and subcutaneous tissue, unspecified I87.2 Venous  insufficiency (chronic) (peripheral) I10 Essential (primary) hypertension FARAJ, OBORNY [name] ([id number]) [id number] Quantity: 1 Page 10 of 10 : [id number] - WC PHYS SUBQ TISS 20 SQ CM 1 ICD-10 Diagnosis Description L97.222 Non-pressure chronic ulcer of left calf with fat layer exposed Quantity: Electronic Signature(s) Signed: 11/25/2022 7:52:26 AM By: [name] Maudlin MD  FACS Entered By: [name] on 11/25/2022 07:52:26

## 2022-11-26 NOTE — Therapy (Signed)
OUTPATIENT PHYSICAL THERAPY TREATMENT NOTE    Patient Name: [name] MRN: [id number] DOB:August 03, 1936, 87 y.o., male Today's Date: 11/26/2022  END OF SESSION:   PT End of Session - 11/26/22 1448     Visit Number 19    Date for PT Re-Evaluation 12/17/22    Authorization Type [employer] Medicare    Progress Note Due on Visit 39    PT Start Time 1441    PT Stop Time 1522    PT Time Calculation (min) 41 min    Equipment Utilized During Treatment Gait belt    Activity Tolerance Patient tolerated treatment well    Behavior During Therapy WFL for tasks assessed/performed                            Past Medical History:  Diagnosis Date   A-fib ([place])    Arthritis    BPH (benign prostatic hypertrophy)    Carotid artery disease ([place])    Doppler, December 08, 2011, 00 [id number] R. ICA, [id number] LICA, followup 1 year   Coronary artery disease    DES Circumflex or CVA 2006  /  clear, February, 2011, EF 70%, no ischemia or   Ejection fraction    EF 60%, echo, 2009, mildly calcified aortic leaflets   History of colonic polyps    Hyperlipidemia    Hypertension    Hypothyroidism    Lumbar radiculopathy    Multiple thyroid nodules    Avascular echogenic areas noted in the right thyroid at the time of carotid Doppler.    PONV (postoperative nausea and vomiting)    "nausea with first hip surgery"   Skin cancer (melanoma) ([place])    PMH of    Spinal stenosis, lumbar    Tremor    Fine tremor right upper extremity   Venous insufficiency    Past Surgical History:  Procedure Laterality Date   COLONOSCOPY     CORONARY ANGIOPLASTY WITH STENT PLACEMENT  2006   x 2 stents; DES to CX and dRCA '06   KNEE SURGERY  1970's   "chipped bone"   LUMBAR LAMINECTOMY/DECOMPRESSION MICRODISCECTOMY Left 07/13/2016   Procedure: LUMBAR LAMINECTOMY AND FORAMINOTOMY Lumbar two, three, Lumbar three-four, Lumbar four-five, LEFT Lumbar five-Sacral one DISECTOMY;  Surgeon: Newman Pies, MD;  Location: [facility];  Service: Neurosurgery;  Laterality: Left;   TOE SURGERY  1997   TONSILLECTOMY     TOTAL HIP ARTHROPLASTY Left 2007   TOTAL HIP ARTHROPLASTY Right 2010   TOTAL KNEE ARTHROPLASTY Right 06/25/2014   Procedure: RIGHT TOTAL KNEE ARTHROPLASTY;  Surgeon: Gearlean Alf, MD;  Location: [facility];  Service: Orthopedics;  Laterality: Right;   VENTRICULOPERITONEAL SHUNT Right 07/09/2022   Procedure: SHUNT INSERTION VENTRICULAR-PERITONEAL;  Surgeon: Newman Pies, MD;  Location: [facility];  Service: Neurosurgery;  Laterality: Right;   Patient Active Problem List   Diagnosis Date Noted   (Idiopathic) normal pressure hydrocephalus ([place]) 07/09/2022   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[place]) 02/05/2022   Gait disturbance 02/05/2022   Benign essential tremor 02/05/2022   Chronic anticoagulation 02/05/2022   Atrial fibrillation ([place]) 02/08/2019   Lumbar stenosis with neurogenic claudication 07/13/2016   Annual physical exam>>>>>>>>>>>>>>>>>> 05/04/2016   PCP NOTES>>>>>>>>>>>>>>>>>>>>>>>>>>> 12/31/2015   Edema 02/19/2015   OA (osteoarthritis) of knee 06/25/2014   Unspecified sleep apnea 06/18/2014   Multiple thyroid nodules    Tremor    Carotid artery disease ([facility])    Erectile dysfunction 05/31/2012   Coronary artery  disease    Venous insufficiency    Hyperglycemia 11/09/2008   MELANOMA, HX OF 11/09/2008   COLONIC POLYPS, HYPERPLASTIC 05/10/2008   Hypothyroidism 08/10/2007   HYPERLIPIDEMIA 08/10/2007   Essential hypertension 08/10/2007   DJD (degenerative joint disease) 08/08/2007   MURMUR 08/08/2007   BENIGN PROSTATIC HYPERTROPHY, HX OF 08/05/2007     THERAPY DIAG:  Other abnormalities of gait and mobility  Muscle weakness (generalized)   PCP: [name], MD   REFERRING PROVIDER: Newman Pies, MD   REFERRING DIAG: G91.1 (ICD-10-CM) - Obstructive hydrocephalus R26.9 (ICD-10-CM) - Gait disturbance   Rationale for Evaluation and Treatment: Rehabilitation   THERAPY DIAG:  Other  abnormalities of gait and mobility   Muscle weakness (generalized)   ONSET DATE: chronic - has done PT before multiple times for strength, balance etc   SUBJECTIVE: I am doing well.  I am getting around well.                                                                                         SUBJECTIVE STATEMENT:   Eval: Pt is referred to OPPT for gait training following VP shunt placement 07/09/22 for communicating hydrocephalus. Has done PT multiple times before and wants to do it again for strength, walking and balance.  He uses a SPC in community and either furniture surfs or uses cane at home. He feels he can do some walking without it.   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  His wife reports he can't go shopping with her due to poor endurance.    PERTINENT HISTORY:  VP shunt placed 07/09/22 for communicating hydrocephalus PMH includes lumbar surgery, bil THA, Rt TKA, a-fib, CAD, coronary angioplasty with stent, compromised ejection fraction   PAIN:  PAIN:  Are you having pain? No       PRECAUTIONS: Other: heart history, VP shunt placed 06/2022 for communicating hydrocephalus   WEIGHT BEARING RESTRICTIONS: No   FALLS:  Has patient fallen in last 6 months? No   LIVING ENVIRONMENT: Lives with: lives with their spouse Lives in: House/apartment Stairs: Yes: Internal: 14 steps; can reach both and External: 4 steps; can reach both, bedroom on main level  Has following equipment at home: Single point cane - uses it in community and furniture surfs at home or uses cane   OCCUPATION: retired   PLOF: Independent with household mobility with device and Independent with community mobility with device   PATIENT GOALS: strength, balance and gait   NEXT MD VISIT:    OBJECTIVE:    DIAGNOSTIC FINDINGS:  N/a   PATIENT SURVEYS:      SCREENING FOR RED FLAGS: Bowel or bladder incontinence: Yes: leaking after urination and if gets too full Spinal  tumors: No Cauda equina syndrome: No Compression fracture: No Abdominal aneurysm: No   COGNITION: Overall cognitive status: Within functional limits for tasks assessed                          SENSATION: WFL   MUSCLE LENGTH: Hamstrings: Right 45 deg; Left 45 deg     POSTURE:  10/13/22: improved postural awareness, needs intermittent cueing to stand tall  Evaluation: flexed trunk  and weight shift right   PALPATION: N/a   LUMBAR ROM:    AROM eval 10/13/22 10/22/22  Flexion Flexes knees with fingers to mid shin Flexes knees with fingers to mid shin Flexes knees with fingers to mid shin  Extension NT 8 8  Right lateral flexion '5 8 8  '$ Left lateral flexion '8 8 8  '$ Right rotation 25% 50% 50%  Left rotation 25% 50% 50%   (Blank rows = not tested)   LOWER EXTREMITY ROM:    Hip ROM grossly limited in ER 50% bil   LOWER EXTREMITY MMT:   10/13/22: 4+/5 bil LE  Eval: Grossly 4/5 on Rt Poor glut med and quad activation in stance phase of gait   LUMBAR SPECIAL TESTS:      FUNCTIONAL TESTS:  10/22/22: 5 times sit to stand: 14 sec no UE Timed up and go (TUG): 17 sec with SPC, 16 sec with SPC Berg Balance Scale: 50/56     10/08/22:  5x sit to stand no UE assist 14 sec TUG:16 sec with SPC  Eval: 5 times sit to stand: 17 sec mod/max use of hands on chair Timed up and go (TUG): 22 sec with SPC Berg Balance Scale: 39/56   GAIT: Distance walked: 3MWT Assistive device utilized: Single point cane Level of assistance: Modified independence Comments: with SPC, covers 340' with SOB at 1:30, needs seated break   TODAY'S TREATMENT:  11/26/22: NuStep L5 x10' PT present to monitor - covered nearly 1/2 mile Hip matrix 1x15 30lb abd and ext bil Outdoor ambulation with SPC and gait belt x 8 min, CGA: level surface, grassy surface, incline/decline, curbs, ramps - needed 2 standing breaks today for SOB Leg press seat 9 90lb bil LE 2x20, 40lb single leg 1x15 each LE  11/24/22: Outdoor  ambulation with SPC and gait belt x 43mn30sec, CGA: level surface, grassy surface, incline/decline, curbs, ramps - needed 2 standing breaks today for SOB Stepping with gait belt: box stepping, step fwd and side with ipsilateral UE reach 2x5 rounds, bwd walking, sidestepping Standing bil UE row blue 1x15 each: square stance and stagger stance Lt and Rt Leg press seat 9 90lb bil LE 2x20, 40lb single leg 1x15 each LE NuStep L5 x5' active cooldown PT present to monitor  11/19/22: NuStep L5/L4/L3 taper resistance over 10' due to Pt fatigue of bil UE Single rail 4 stair flight step to pattern 3 rounds Leg press seat 9 90lb bil LE 2x20, 40lb single leg 1x15 each LE Outdoor ambulation with SPC and gait belt x 61m30sec, CGA: level surface, grassy surface, incline/decline, curbs, ramps Hip matrix 2x10 bil LE hip abd x25lb   PATIENT EDUCATION:  Education details: Access Code: [email or web address] educated: Patient and Spouse Education method: Explanation, Demonstration, and Handouts Education comprehension: verbalized understanding and returned demonstration   HOME EXERCISE PROGRAM: Access Code: [email or web address]: [email or web address] Date: 09/30/2022 Prepared by: JoVenetia Nighteuhring  Exercises - Seated Long Arc Quad  - 1 x daily - 7 x weekly - 1 sets - 10 reps - 5 hold - Standing Hip Abduction with Counter Support  - 1 x daily - 7 x weekly - 1 sets - 10 reps - Standing Hip Extension with Counter Support  - 1 x daily - 7 x weekly - 1 sets - 10 reps - Heel Raises with Counter Support  - 1 x daily - 7 x weekly - 1  sets - 10 reps - Seated Hip Adduction Isometrics with Ball  - 1 x daily - 7 x weekly - 1 sets - 10 reps - Seated March  - 1 x daily - 7 x weekly - 1 sets - 10 reps - Ankle Pumps in Elevation  - 1 x daily - 7 x weekly - 1 sets - 10 reps - Forward Step Up  - 3 x daily - 7 x weekly - 2 sets - 10 reps - Standing Forward Toe Taps on Box (BKA)  - 3 x daily - 7 x weekly - 2 sets - 10  reps - Sit to Stand Without Arm Support  - 3 x daily - 7 x weekly - 1 sets - 10 reps - Standing Row with Anchored Resistance  - 1 x daily - 7 x weekly - 2 sets - 10 reps - Shoulder extension with resistance - Neutral  - 1 x daily - 7 x weekly - 2 sets - 10 reps   ASSESSMENT:   CLINICAL IMPRESSION: Pt demos improved short distance indoor ambulation with less lateral trunk lean and improved hip stability bil without use of cane.  He progressed outdoor ambulation with SPC for up to 8' using curbs, incline and decline on grass, and level surfaces today.  This is huge progress from him as he used to only be able to walk for 1' indoors on level surface before needing seated break for SOB.  He still gets winded but is learning to pace himself to last longer, trust his legs, and can take brief (<20 sec) standing breaks to catch breath and continue.  He is on track to meet goals in 2 weeks.   OBJECTIVE IMPAIRMENTS: Abnormal gait, cardiopulmonary status limiting activity, decreased activity tolerance, decreased balance, decreased coordination, decreased endurance, decreased knowledge of condition, decreased mobility, difficulty walking, decreased ROM, decreased strength, decreased safety awareness, hypomobility, increased edema, impaired flexibility, improper body mechanics, and postural dysfunction.    ACTIVITY LIMITATIONS: carrying, lifting, bending, standing, squatting, stairs, transfers, dressing, and locomotion level   PARTICIPATION LIMITATIONS: meal prep, cleaning, laundry, shopping, community activity, and yard work   PERSONAL FACTORS: Age, Time since onset of injury/illness/exacerbation, and 3+ comorbidities: + cardiac history, hydrocephalus with shunt, LE edema, Rt LE weakness with Hx of THA and TKA  are also affecting patient's functional outcome.    REHAB POTENTIAL: Good   CLINICAL DECISION MAKING: Stable/uncomplicated   EVALUATION COMPLEXITY: Low     GOALS: Goals reviewed with patient?  Yes   SHORT TERM GOALS: Target date: 09/24/22   Pt will be ind with initial HEP Baseline: Goal status: met   2.  Pt will improve 5X sit to stand to </= 15 sec with min use of UE Baseline: 17 sec with mod/max use of UE Goal status: met, 14 sec   3.  Pt will demo improved quad and lateral hip muscle activations on Rt with stance phase of gait. Baseline:  Goal status: met   4.  Improve TUG to 19 sec or less. Baseline: 22 sec Goal status: met, 16 sec      LONG TERM GOALS: Target date: 10/22/22   Pt will be ind with advanced HEP to maintain gains from PT. Baseline:  Goal status: ongoing   2.  Pt will improve TUG to 17 sec or less to demo improved efficiency and safety with functional mobility. Baseline:  Goal status: met, 16 sec   3.  Improve 5X sit to stand to 13  sec or less with min or no use of UE to demo reduced fall risk Baseline: 17 sec Goal status: ongoing, 14 sec   4.  Pt will demo improved gait endurance to allow for a 3 min walk test. Baseline: covered 350' on 10/20/22 Goal status: MET   5.  Pt will improve LE strength to at least 4+/5 on Rt for improved gait safety, transfers and stairs Baseline: 4/5 Goal status: ONGOING   6.  Improve BERG Balance Test to at least 44/52 to demo improved static and dynamic balance to reduce fall risk. Baseline: 39/52 Goal status: met on 10/13/22   PLAN:   PT FREQUENCY: 2x/week   PT DURATION: 8 weeks   PLANNED INTERVENTIONS: Therapeutic exercises, Therapeutic activity, Neuromuscular re-education, Balance training, Gait training, Patient/Family education, Self Care, Joint mobilization, Stair training, DME instructions, Electrical stimulation, Spinal mobilization, Cryotherapy, Moist heat, and Manual therapy.   PLAN FOR NEXT SESSION: NuStep, hip matrix and leg press, indoor and outdoor ambulation with cane,  continue balance, functional strength , postural strength  Blannie Shedlock, PT 11/26/22 3:40  PM

## 2022-11-29 ENCOUNTER — Other Ambulatory Visit: Payer: Self-pay | Admitting: Internal Medicine

## 2022-11-30 MED ORDER — METOPROLOL SUCCINATE ER 25 MG PO TB24: ORAL_TABLET | ORAL | 0 refills | Status: AC

## 2022-12-04 ENCOUNTER — Encounter (HOSPITAL_BASED_OUTPATIENT_CLINIC_OR_DEPARTMENT_OTHER): Payer: Medicare Other | Admitting: Internal Medicine

## 2022-12-04 DIAGNOSIS — Z955 Presence of coronary angioplasty implant and graft: Secondary | ICD-10-CM | POA: Diagnosis not present

## 2022-12-04 DIAGNOSIS — I2510 Atherosclerotic heart disease of native coronary artery without angina pectoris: Secondary | ICD-10-CM | POA: Diagnosis not present

## 2022-12-04 DIAGNOSIS — I10 Essential (primary) hypertension: Secondary | ICD-10-CM | POA: Diagnosis not present

## 2022-12-04 DIAGNOSIS — L97222 Non-pressure chronic ulcer of left calf with fat layer exposed: Secondary | ICD-10-CM | POA: Diagnosis not present

## 2022-12-04 DIAGNOSIS — L989 Disorder of the skin and subcutaneous tissue, unspecified: Secondary | ICD-10-CM | POA: Diagnosis not present

## 2022-12-04 DIAGNOSIS — Z79899 Other long term (current) drug therapy: Secondary | ICD-10-CM | POA: Diagnosis not present

## 2022-12-04 DIAGNOSIS — Z85828 Personal history of other malignant neoplasm of skin: Secondary | ICD-10-CM | POA: Diagnosis not present

## 2022-12-04 DIAGNOSIS — Z7901 Long term (current) use of anticoagulants: Secondary | ICD-10-CM | POA: Diagnosis not present

## 2022-12-04 DIAGNOSIS — I872 Venous insufficiency (chronic) (peripheral): Secondary | ICD-10-CM | POA: Diagnosis not present

## 2022-12-04 DIAGNOSIS — I480 Paroxysmal atrial fibrillation: Secondary | ICD-10-CM | POA: Diagnosis not present

## 2022-12-05 NOTE — Progress Notes (Signed)
MICHELL, WITHERELL ([id number]) [id number] Page 1 of 1 Visit Report for 12/04/2022 SuperBill Details Patient Name: Date of Service: [name], [name] 12/04/2022 Medical Record Number: [id number] Patient Account Number: [id number] Date of Birth/Sex: Treating RN: [birth year] (87 y.o. [name] Primary Care Provider: [name] Other Clinician: Referring Provider: Treating Provider/Extender: [name]: 16 Diagnosis Coding ICD-10 Codes Code Description [phone] Non-pressure chronic ulcer of left calf with fat layer exposed L98.9 Disorder of the skin and subcutaneous tissue, unspecified I87.2 Venous insufficiency (chronic) (peripheral) I10 Essential (primary) hypertension Facility Procedures CPT4 Code Description Modifier Quantity [id number] (Facility Use Only) [phone] - [place] [id number] LWR LT LEG 1 ICD-10 Diagnosis Description L97.222 Non-pressure chronic ulcer of left calf with fat layer exposed Electronic Signature(s) Signed: 12/04/2022 3:54:15 PM By: Kalman Shan DO Signed: 12/04/2022 4:34:51 PM By: [name] Entered By: [name] on 12/04/2022 14:22:49

## 2022-12-05 NOTE — Progress Notes (Signed)
[name], [name] ([id number]) [id number] Page 1 of 4 Visit Report for 12/04/2022 Arrival Information Details Patient Name: Date of Service: [name] 12/04/2022 2:00 PM Medical Record Number: [id number] Patient Account Number: [id number] Date of Birth/Sex: Treating RN: 16-Jan-1936 (87 y.o. [name] Primary Care [name]: [name] Other Clinician: Referring [name]: Treating [name]: [name] in Treatment: 16 Visit Information History Since Last Visit Added or deleted any medications: No Patient Arrived: [name] Any new allergies or adverse reactions: No Arrival Time: 13:58 Had a fall or experienced change in No Accompanied By: wife activities of daily living that may affect Transfer Assistance: None risk of falls: Patient Identification Verified: Yes Signs or symptoms of abuse/neglect since last visito No Secondary Verification Process Completed: Yes Hospitalized since last visit: No Implantable device outside of the clinic excluding No cellular tissue based products placed in the center since last visit: Has Dressing in Place as Prescribed: Yes Has Compression in Place as Prescribed: Yes Pain Present Now: No Electronic Signature(s) Signed: 12/04/2022 4:34:51 PM By: [name] Entered By: [name] on 12/04/2022 13:59:08 -------------------------------------------------------------------------------- Compression Therapy Details Patient Name: Date of Service: [name] 12/04/2022 2:00 PM Medical Record Number: [id number] Patient Account Number: [id number] Date of Birth/Sex: Treating RN: 07/08/1936 (87 y.o. [name] Primary Care Vraj Denardo: [name] Other Clinician: Referring [name]: Treating [name]: [name]: 16 Compression Therapy Performed for Wound Assessment: Wound #3 Left,Posterior Lower Leg Performed By:  Clinician [name], RN Compression Type: Three Layer Electronic Signature(s) Signed: 12/04/2022 4:34:51 PM By: [name] Entered By: [name] on 12/04/2022 14:22:15 -------------------------------------------------------------------------------- Encounter Discharge Information Details Patient Name: Date of Service: [name] 12/04/2022 2:00 PM Medical Record Number: [id number] Patient Account Number: [id number] Date of Birth/Sex: Treating RN: 04-14-1936 (87 y.o. [name] Primary Care Somtochukwu Woollard: [name] Other Clinician: Referring [name]: Treating [name]: [name]: 16 Encounter Discharge Information Items Discharge Condition: Stable Ambulatory Status: Cane Discharge Destination: Home Transportation: Private Auto Accompanied By: wife Schedule Follow-up Appointment: Yes Clinical Summary of Care: Patient [name], [name] ([id number]) [id number] Page 2 of 4 Electronic Signature(s) Signed: 12/04/2022 4:34:51 PM By: [name] By: [name] on 12/04/2022 14:22:39 -------------------------------------------------------------------------------- Patient/Caregiver Education Details Patient Name: Date of Service: [name] 3/15/2024andnbsp2:00 PM Medical Record Number: [id number] Patient Account Number: [id number] Date of Birth/Gender: Treating RN: 04-Apr-1936 (87 y.o. [name] Primary Care Physician: [name] Other Clinician: Referring Physician: Treating Physician/Extender: [name] in Treatment: 16 Education Assessment Education Provided To: Patient Education Topics Provided Safety: Methods: Explain/Verbal Responses: Reinforcements needed, State content correctly Electronic Signature(s) Signed: 12/04/2022 4:34:51 PM By: [name] Entered By: [name] on  12/04/2022 14:22:29 -------------------------------------------------------------------------------- Wound Assessment Details Patient Name: Date of Service: [name] 12/04/2022 2:00 PM Medical Record Number: [id number] Patient Account Number: [id number] Date of Birth/Sex: Treating RN: 1936/08/05 (87 y.o. [name] Primary Care [name]: [name] Other Clinician: Referring [name]: Treating [name]: [name]: 16 Wound Status Wound Number: 3 Primary Lesion Etiology: Wound Location: Left, Posterior Lower Leg Wound Open Wounding Event: Puncture Status: Date Acquired: 08/12/2022 Comorbid Glaucoma, Arrhythmia, Coronary Artery Disease, Hypertension, Weeks Of Treatment: 14 History: Peripheral Venous Disease, Osteoarthritis Clustered Wound: No Wound Measurements Length: (cm) 2.1 Width: (cm) 1.4 Depth: (cm) 0.2 Area: (cm) 2.309 Volume: (cm) 0.462 % Reduction  in Area: -3152.1% % Reduction in Volume: -3200% Epithelialization: Small (1-33%) Wound Description Classification: Full Thickness Without Exposed Suppor Wound Margin: Distinct, outline attached Exudate Amount: Medium Exudate Type: Serosanguineous Exudate Color: red, brown t Structures Foul Odor After Cleansing: No Slough/Fibrino Yes Wound Bed Granulation Amount: Medium (34-66%) Exposed Structure Granulation Quality: Red Fascia Exposed: No Necrotic Amount: Medium (34-66%) Fat Layer (Subcutaneous Tissue) Exposed: Yes [name], [name] ([id number]) [id number] Page 3 of 4 Necrotic Quality: Eschar, Adherent Slough Tendon Exposed: No Muscle Exposed: No Joint Exposed: No Bone Exposed: No Periwound Skin Texture Texture Color No Abnormalities Noted: Yes No Abnormalities Noted: No Hemosiderin Staining: Yes Moisture No Abnormalities Noted: Yes Temperature / Pain Temperature: No Abnormality Treatment Notes Wound #3 (Lower Leg)  Wound Laterality: Left, Posterior Cleanser Soap and Water Discharge Instruction: May shower and wash wound with dial antibacterial soap and water prior to dressing change. Wound Cleanser Discharge Instruction: Cleanse the wound with wound cleanser prior to applying a clean dressing using gauze sponges, not tissue or cotton balls. Peri-Wound Care Sween Lotion (Moisturizing lotion) Discharge Instruction: Apply moisturizing lotion as directed Topical Primary Dressing IODOFLEX 0.9% Cadexomer Iodine Pad 4x6 cm Discharge Instruction: Apply to wound bed as instructed Secondary Dressing ABD Pad, 5x9 Discharge Instruction: Apply over primary dressing as directed. Woven Gauze Sponge, Non-Sterile 4x4 in Discharge Instruction: Apply over primary dressing as directed. Secured With Compression Wrap ThreePress (3 layer compression wrap) Discharge Instruction: Apply three layer compression as directed. Compression Stockings Add-Ons Electronic Signature(s) Signed: 12/04/2022 4:34:51 PM By: [name] Entered By: [name] on 12/04/2022 13:59:19 -------------------------------------------------------------------------------- Vitals Details Patient Name: Date of Service: [name] 12/04/2022 2:00 PM Medical Record Number: [id number] Patient Account Number: [id number] Date of Birth/Sex: Treating RN: 10/23/1935 (87 y.o. [name] Primary Care Valori Hollenkamp: [name] Other Clinician: Referring Anara Cowman: Treating Tatyanna Cronk/Extender: [name]: 16 Vital Signs Time Taken: 14:06 Temperature (F): 97.8 Pulse (bpm): 68 Respiratory Rate (breaths/min): 16 Blood Pressure (mmHg): 143/80 Reference Range: 80 - 120 mg / dl [name], [name] ([id number]) [id number] Page 4 of 4 Electronic Signature(s) Signed: 12/04/2022 4:34:51 PM By: [name] Entered By: [name] on 12/04/2022 14:06:21

## 2022-12-07 DIAGNOSIS — G910 Communicating hydrocephalus: Secondary | ICD-10-CM | POA: Diagnosis not present

## 2022-12-08 ENCOUNTER — Ambulatory Visit: Payer: Medicare Other | Admitting: Physical Therapy

## 2022-12-08 ENCOUNTER — Encounter: Payer: Self-pay | Admitting: Physical Therapy

## 2022-12-08 DIAGNOSIS — R2689 Other abnormalities of gait and mobility: Secondary | ICD-10-CM

## 2022-12-08 DIAGNOSIS — M6281 Muscle weakness (generalized): Secondary | ICD-10-CM | POA: Diagnosis not present

## 2022-12-08 NOTE — Therapy (Signed)
OUTPATIENT PHYSICAL THERAPY TREATMENT NOTE  Progress Note Reporting Period 10/15/22 to 12/08/22  See note below for Objective Data and Assessment of Progress/Goals.      Patient Name: [name] MRN: [id number] DOB:[birth year], 87 y.o., male Today's Date: 12/08/2022  END OF SESSION:   PT End of Session - 12/08/22 1239     Visit Number 20    Date for PT Re-Evaluation 12/17/22    Authorization Type [employer] Medicare    Progress Note Due on Visit 53    PT Start Time 1235    PT Stop Time 1317    PT Time Calculation (min) 42 min    Equipment Utilized During Treatment Gait belt    Activity Tolerance Patient tolerated treatment well    Behavior During Therapy WFL for tasks assessed/performed                             Past Medical History:  Diagnosis Date   A-fib ([place])    Arthritis    BPH (benign prostatic hypertrophy)    Carotid artery disease ([place])    Doppler, December 08, 2011, 00 [id number] R. ICA, [id number] LICA, followup 1 year   Coronary artery disease    DES Circumflex or CVA 2006  /  clear, February, 2011, EF 70%, no ischemia or   Ejection fraction    EF 60%, echo, 2009, mildly calcified aortic leaflets   History of colonic polyps    Hyperlipidemia    Hypertension    Hypothyroidism    Lumbar radiculopathy    Multiple thyroid nodules    Avascular echogenic areas noted in the right thyroid at the time of carotid Doppler.    PONV (postoperative nausea and vomiting)    "nausea with first hip surgery"   Skin cancer (melanoma) ([place])    PMH of    Spinal stenosis, lumbar    Tremor    Fine tremor right upper extremity   Venous insufficiency    Past Surgical History:  Procedure Laterality Date   COLONOSCOPY     CORONARY ANGIOPLASTY WITH STENT PLACEMENT  2006   x 2 stents; DES to CX and dRCA '06   KNEE SURGERY  1970's   "chipped bone"   LUMBAR LAMINECTOMY/DECOMPRESSION MICRODISCECTOMY Left 07/13/2016   Procedure: LUMBAR LAMINECTOMY AND FORAMINOTOMY Lumbar  two, three, Lumbar three-four, Lumbar four-five, LEFT Lumbar five-Sacral one DISECTOMY;  Surgeon: Newman Pies, MD;  Location: [facility];  Service: Neurosurgery;  Laterality: Left;   TOE SURGERY  1997   TONSILLECTOMY     TOTAL HIP ARTHROPLASTY Left 2007   TOTAL HIP ARTHROPLASTY Right 2010   TOTAL KNEE ARTHROPLASTY Right 06/25/2014   Procedure: RIGHT TOTAL KNEE ARTHROPLASTY;  Surgeon: Gearlean Alf, MD;  Location: [facility];  Service: Orthopedics;  Laterality: Right;   VENTRICULOPERITONEAL SHUNT Right 07/09/2022   Procedure: SHUNT INSERTION VENTRICULAR-PERITONEAL;  Surgeon: Newman Pies, MD;  Location: [facility];  Service: Neurosurgery;  Laterality: Right;   Patient Active Problem List   Diagnosis Date Noted   (Idiopathic) normal pressure hydrocephalus ([place]) 07/09/2022   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[place]) 02/05/2022   Gait disturbance 02/05/2022   Benign essential tremor 02/05/2022   Chronic anticoagulation 02/05/2022   Atrial fibrillation ([place]) 02/08/2019   Lumbar stenosis with neurogenic claudication 07/13/2016   Annual physical exam>>>>>>>>>>>>>>>>>> 05/04/2016   PCP NOTES>>>>>>>>>>>>>>>>>>>>>>>>>>> 12/31/2015   Edema 02/19/2015   OA (osteoarthritis) of knee 06/25/2014   Unspecified sleep apnea 06/18/2014   Multiple thyroid  nodules    Tremor    Carotid artery disease ([facility])    Erectile dysfunction 05/31/2012   Coronary artery disease    Venous insufficiency    Hyperglycemia 11/09/2008   MELANOMA, HX OF 11/09/2008   COLONIC POLYPS, HYPERPLASTIC 05/10/2008   Hypothyroidism 08/10/2007   HYPERLIPIDEMIA 08/10/2007   Essential hypertension 08/10/2007   DJD (degenerative joint disease) 08/08/2007   MURMUR 08/08/2007   BENIGN PROSTATIC HYPERTROPHY, HX OF 08/05/2007     THERAPY DIAG:  Other abnormalities of gait and mobility  Muscle weakness (generalized)   PCP: [name], MD   REFERRING PROVIDER: Newman Pies, MD   REFERRING DIAG: G91.1 (ICD-10-CM) - Obstructive  hydrocephalus R26.9 (ICD-10-CM) - Gait disturbance   Rationale for Evaluation and Treatment: Rehabilitation   THERAPY DIAG:  Other abnormalities of gait and mobility   Muscle weakness (generalized)   ONSET DATE: chronic - has done PT before multiple times for strength, balance etc   SUBJECTIVE: I was on vacation last week.  I was able to walk a lot more on vacation and go out to meals.                                                                                         SUBJECTIVE STATEMENT:   Eval: Pt is referred to [facility]PP[facility] for gait training following VP shunt placement 07/09/22 for communicating hydrocephalus. Has done PT multiple times before and wants to do it again for strength, walking and balance.  He uses a SPC in community and either furniture surfs or uses cane at home. He feels he can do some walking without it.   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  His wife reports he can't go shopping with her due to poor endurance.    PERTINENT HISTORY:  VP shunt placed 07/09/22 for communicating hydrocephalus PMH includes lumbar surgery, bil THA, Rt TKA, a-fib, CAD, coronary angioplasty with stent, compromised ejection fraction   PAIN:  PAIN:  Are you having pain? No       PRECAUTIONS: Other: heart history, VP shunt placed 06/2022 for communicating hydrocephalus   WEIGHT BEARING RESTRICTIONS: No   FALLS:  Has patient fallen in last 6 months? No   LIVING ENVIRONMENT: Lives with: lives with their spouse Lives in: House/apartment Stairs: Yes: Internal: 14 steps; can reach both and External: 4 steps; can reach both, bedroom on main level  Has following equipment at home: Single point cane - uses it in community and furniture surfs at home or uses cane   OCCUPATION: retired   PLOF: Independent with household mobility with device and Independent with community mobility with device   PATIENT GOALS: strength, balance and gait   NEXT MD VISIT:     OBJECTIVE:    DIAGNOSTIC FINDINGS:  N/a   PATIENT SURVEYS:      SCREENING FOR RED FLAGS: Bowel or bladder incontinence: Yes: leaking after urination and if gets too full Spinal tumors: No Cauda equina syndrome: No Compression fracture: No Abdominal aneurysm: No   COGNITION: Overall cognitive status: Within functional limits for tasks assessed  SENSATION: WFL   MUSCLE LENGTH: Hamstrings: Right 45 deg; Left 45 deg     POSTURE:  10/13/22: improved postural awareness, needs intermittent cueing to stand tall  Evaluation: flexed trunk  and weight shift right   PALPATION: N/a   LUMBAR ROM:    AROM eval 10/13/22 10/22/22  Flexion Flexes knees with fingers to mid shin Flexes knees with fingers to mid shin Flexes knees with fingers to mid shin  Extension NT 8 8  Right lateral flexion 5 8 8   Left lateral flexion 8 8 8   Right rotation 25% 50% 50%  Left rotation 25% 50% 50%   (Blank rows = not tested)   LOWER EXTREMITY ROM:    Hip ROM grossly limited in ER 50% bil   LOWER EXTREMITY MMT:   10/13/22: 4+/5 bil LE  Eval: Grossly 4/5 on Rt Poor glut med and quad activation in stance phase of gait   LUMBAR SPECIAL TESTS:      FUNCTIONAL TESTS:  12/08/22 5 times sit to stand: 16.85 first trial, 15:29 second trial TUG: 12.99 sec with SPC, 12.22 sec no cane  10/22/22: 5 times sit to stand: 14 sec no UE Timed up and go (TUG): 17 sec with SPC, 16 sec with SPC Berg Balance Scale: 50/56     10/08/22:  5x sit to stand no UE assist 14 sec TUG:16 sec with SPC  Eval: 5 times sit to stand: 17 sec mod/max use of hands on chair Timed up and go (TUG): 22 sec with SPC Berg Balance Scale: 39/56   GAIT: Distance walked: 3MWT Assistive device utilized: Single point cane Level of assistance: Modified independence Comments: with SPC, covers 340' with SOB at 1:30, needs seated break   TODAY'S TREATMENT:  12/08/22: NuStep L5 x 10' PT present to discuss  goals 5x STS 2 trials TUG without and with SPC 2 trials Leg press seat 9 90lb bil LE 1x25, 40lb single leg 2x10 each LE Standing blue tband bil row x25, bil shoulder extension x15 Hip matrix 1x15 40lb abd and ext bil  11/26/22: NuStep L5 x10' PT present to monitor - covered nearly 1/2 mile Hip matrix 1x15 30lb abd and ext bil Outdoor ambulation with SPC and gait belt x 8 min, CGA: level surface, grassy surface, incline/decline, curbs, ramps - needed 2 standing breaks today for SOB Leg press seat 9 90lb bil LE 2x20, 40lb single leg 1x15 each LE  11/24/22: Outdoor ambulation with SPC and gait belt x 69min30sec, CGA: level surface, grassy surface, incline/decline, curbs, ramps - needed 2 standing breaks today for SOB Stepping with gait belt: box stepping, step fwd and side with ipsilateral UE reach 2x5 rounds, bwd walking, sidestepping Standing bil UE row blue 1x15 each: square stance and stagger stance Lt and Rt Leg press seat 9 90lb bil LE 2x20, 40lb single leg 1x15 each LE NuStep L5 x5' active cooldown PT present to monitor   PATIENT EDUCATION:  Education details: Access Code: [email or web address] Person educated: Patient and Spouse Education method: Explanation, Demonstration, and Handouts Education comprehension: verbalized understanding and returned demonstration   HOME EXERCISE PROGRAM: Access Code: [email or web address] URL: [email or web address] Date: 09/30/2022 Prepared by: Venetia Night Mashanda Ishibashi  Exercises - Seated Long Arc Quad  - 1 x daily - 7 x weekly - 1 sets - 10 reps - 5 hold - Standing Hip Abduction with Counter Support  - 1 x daily - 7 x weekly - 1 sets - 10 reps - Standing Hip Extension with Counter Support  -  1 x daily - 7 x weekly - 1 sets - 10 reps - Heel Raises with Counter Support  - 1 x daily - 7 x weekly - 1 sets - 10 reps - Seated Hip Adduction Isometrics with Ball  - 1 x daily - 7 x weekly - 1 sets - 10 reps - Seated March  - 1 x daily - 7 x weekly - 1 sets - 10 reps -  Ankle Pumps in Elevation  - 1 x daily - 7 x weekly - 1 sets - 10 reps - Forward Step Up  - 3 x daily - 7 x weekly - 2 sets - 10 reps - Standing Forward Toe Taps on Box (BKA)  - 3 x daily - 7 x weekly - 2 sets - 10 reps - Sit to Stand Without Arm Support  - 3 x daily - 7 x weekly - 1 sets - 10 reps - Standing Row with Anchored Resistance  - 1 x daily - 7 x weekly - 2 sets - 10 reps - Shoulder extension with resistance - Neutral  - 1 x daily - 7 x weekly - 2 sets - 10 reps   ASSESSMENT:   CLINICAL IMPRESSION: Pt returns from beach trip and reports he was able to walk more and go out for meals with improved stamina and balance.  His wife said his referring doctor saw him this week and commented on how much better he was walking.  Pt with improved TUG test today but did show some regression on 5x STS, with need for reminders on body mechanics to rise from chair.  Pt able to increase resistance and reps on hip matrix and leg press today.  PT will test a 6MWT next visit at [facility] with anticipated d/c.     OBJECTIVE IMPAIRMENTS: Abnormal gait, cardiopulmonary status limiting activity, decreased activity tolerance, decreased balance, decreased coordination, decreased endurance, decreased knowledge of condition, decreased mobility, difficulty walking, decreased ROM, decreased strength, decreased safety awareness, hypomobility, increased edema, impaired flexibility, improper body mechanics, and postural dysfunction.    ACTIVITY LIMITATIONS: carrying, lifting, bending, standing, squatting, stairs, transfers, dressing, and locomotion level   PARTICIPATION LIMITATIONS: meal prep, cleaning, laundry, shopping, community activity, and yard work   PERSONAL FACTORS: Age, Time since onset of injury/illness/exacerbation, and 3+ comorbidities: + cardiac history, hydrocephalus with shunt, LE edema, Rt LE weakness with Hx of THA and TKA  are also affecting patient's functional outcome.    REHAB POTENTIAL: Good    CLINICAL DECISION MAKING: Stable/uncomplicated   EVALUATION COMPLEXITY: Low     GOALS: Goals reviewed with patient? Yes   SHORT TERM GOALS: Target date: 09/24/22   Pt will be ind with initial HEP Baseline: Goal status: met   2.  Pt will improve 5X sit to stand to </= 15 sec with min use of UE Baseline: 17 sec with mod/max use of UE Goal status: met, 14 sec   3.  Pt will demo improved quad and lateral hip muscle activations on Rt with stance phase of gait. Baseline:  Goal status: met   4.  Improve TUG to 19 sec or less. Baseline: 22 sec Goal status: met, 16 sec      LONG TERM GOALS: Target date: 10/22/22   Pt will be ind with advanced HEP to maintain gains from PT. Baseline:  Goal status: ongoing   2.  Pt will improve TUG to 17 sec or less to demo improved efficiency and safety with functional mobility. Baseline:  Goal status: met, 16 sec   3.  Improve 5X sit to stand to 13 sec or less with min or no use of UE to demo reduced fall risk Baseline: 17 sec Goal status: ongoing, 14 sec   4.  Pt will demo improved gait endurance to allow for a 3 min walk test. Baseline: covered 350' on 10/20/22 Goal status: MET   5.  Pt will improve LE strength to at least 4+/5 on Rt for improved gait safety, transfers and stairs Baseline: 4/5 Goal status: ONGOING   6.  Improve BERG Balance Test to at least 44/52 to demo improved static and dynamic balance to reduce fall risk. Baseline: 39/52 Goal status: met on 10/13/22   PLAN:   PT FREQUENCY: 2x/week   PT DURATION: 8 weeks   PLANNED INTERVENTIONS: Therapeutic exercises, Therapeutic activity, Neuromuscular re-education, Balance training, Gait training, Patient/Family education, Self Care, Joint mobilization, Stair training, DME instructions, Electrical stimulation, Spinal mobilization, Cryotherapy, Moist heat, and Manual therapy.   PLAN FOR NEXT SESSION: indoor 6MWT, [facility] with d/c, review HEP  Devora Tortorella, PT 12/08/22 1:22  PM

## 2022-12-08 NOTE — Progress Notes (Signed)
Date:  12/15/2022   ID:  [name], DOB Apr 15, 1936, MRN [id number]   PCP:  Colon Branch, MD  Cardiologist:   Johnsie Cancel Electrophysiologist:  None   Evaluation Performed:  Follow-Up Visit  Chief Complaint:  CAD/AFib  History of Present Illness:    [name] is a 87 y.o.  male with a history of CAD s/p DES to LCx (2006), carotid artery disease, HTN, HLD who presents to clinic for follow up. No angina compliant with meds Normal nuclear study 2017 prior to back surgery. LE Edema from varicosities and previous right TKR takes lasix for this. Dr Larose Kells did venous duplex for swelling 02/07/19 and no DVT  Seen by Dr Donzetta Matters [facility] 05/19/19 and felt saphenous veins too small for intervention and would Rx conservatively with elevation, compression stockings and diuretics has seen wound center for ulcer on right LE and left calf Most recent visit 12/05/22   10/20/18  found to be in new onset afib. He did not notice. No palpitations, mild exertional dyspnea chronic No chest pain.    Started on Eliquis CHA2DS2-Vasc is 4    He is asymptomatic Tolerating blood thinner  In NSR  TTE done 10/13/18 reviewed EF normal 55-60% severe LAE  ? Developing Parkinsons with abnormal gait and tremors Seen by Dr Larose Kells not referred to Neuro -> PT/OT Noted MRI 05/15/18 suggested possible normal pressure hydrocephalus  Had VP shunt placed 07/09/22 by Dr Arnoldo Morale   Doing much better post shunt Ambulating with cane   No cardiac concerns   Past Medical History:  Diagnosis Date   A-fib [facility])    Arthritis    BPH (benign prostatic hypertrophy)    Carotid artery disease ([place])    Doppler, December 08, 2011, 00 [id number] R. ICA, [id number] LICA, followup 1 year   Coronary artery disease    DES Circumflex or CVA 2006  /  clear, February, 2011, EF 70%, no ischemia or   Ejection fraction    EF 60%, echo, 2009, mildly calcified aortic leaflets   History of colonic polyps    Hyperlipidemia    Hypertension    Hypothyroidism    Lumbar  radiculopathy    Multiple thyroid nodules    Avascular echogenic areas noted in the right thyroid at the time of carotid Doppler.    PONV (postoperative nausea and vomiting)    "nausea with first hip surgery"   Skin cancer (melanoma) ([place])    PMH of    Spinal stenosis, lumbar    Tremor    Fine tremor right upper extremity   Venous insufficiency    Past Surgical History:  Procedure Laterality Date   COLONOSCOPY     CORONARY ANGIOPLASTY WITH STENT PLACEMENT  2006   x 2 stents; DES to CX and dRCA '06   KNEE SURGERY  1970's   "chipped bone"   LUMBAR LAMINECTOMY/DECOMPRESSION MICRODISCECTOMY Left 07/13/2016   Procedure: LUMBAR LAMINECTOMY AND FORAMINOTOMY Lumbar two, three, Lumbar three-four, Lumbar four-five, LEFT Lumbar five-Sacral one DISECTOMY;  Surgeon: Newman Pies, MD;  Location: [place];  Service: Neurosurgery;  Laterality: Left;   TOE SURGERY  1997   TONSILLECTOMY     TOTAL HIP ARTHROPLASTY Left 2007   TOTAL HIP ARTHROPLASTY Right 2010   TOTAL KNEE ARTHROPLASTY Right 06/25/2014   Procedure: RIGHT TOTAL KNEE ARTHROPLASTY;  Surgeon: Gearlean Alf, MD;  Location[facility];  Service: Orthopedics;  Laterality: Right;   VENTRICULOPERITONEAL SHUNT Right 07/09/2022   Procedure: SHUNT  INSERTION VENTRICULAR-PERITONEAL;  Surgeon: Newman Pies, MD;  Location: [place];  Service: Neurosurgery;  Laterality: Right;     Current Meds  Medication Sig   ascorbic acid (VITAMIN C) 500 MG tablet Take 500 mg by mouth daily.   aspirin 81 MG tablet Take 1 tablet (81 mg total) by mouth daily.   atorvastatin (LIPITOR) 40 MG tablet TAKE 1 AND 1/2 TABLETS(60 MG) BY MOUTH AT BEDTIME   benazepril (LOTENSIN) 20 MG tablet Take 1.5 tablets (30 mg total) by mouth daily.   diltiazem (DILACOR XR) 180 MG 24 hr capsule Take 1 capsule (180 mg total) by mouth daily.   docusate sodium (COLACE) 100 MG capsule Take 1 capsule (100 mg total) by mouth 2 (two) times daily.   ELIQUIS 5 MG TABS tablet TAKE 1 TABLET BY  MOUTH TWICE DAILY   furosemide (LASIX) 40 MG tablet Take 1 1/2 tablet by mouth once daily   levothyroxine (SYNTHROID) 175 MCG tablet Take 1 tablet (175 mcg total) by mouth daily before breakfast.   LUMIGAN 0.01 % SOLN Place 1 drop into both eyes at bedtime.   metoprolol succinate (TOPROL-XL) 25 MG 24 hr tablet 1/2 tablet a day as needed for pulse > 80   nitroGLYCERIN (NITROSTAT) 0.4 MG SL tablet PLACE 1 TABLET UNDER THE TONGUE EVERY 5 MINUTES AS NEEDED FOR CHEST PAIN   Timolol Maleate 0.5 % (DAILY) SOLN Apply 1 drop to eye in the morning and at bedtime. At breakfast and evening meals     Allergies:   Norvasc [amlodipine besylate]   Social History   Tobacco Use   Smoking status: Former    Types: Cigarettes    Quit date: 09/21/1984    Years since quitting: 38.2   Smokeless tobacco: Never   Tobacco comments:    smoked 1958-1986, up to 1 ppd  Vaping Use   Vaping Use: Never used  Substance Use Topics   Alcohol use: Yes    Comment: socially on occasion   Drug use: No     Family Hx: The patient's family history includes Colon cancer (age of onset: 36) in his father; Coronary artery disease in his mother; Diabetes in his maternal grandfather and sister; Heart attack (age of onset: 48) in his sister; Heart attack (age of onset: 54) in his maternal grandfather; Hypertension in his father; Pancreatitis in his father. There is no history of Thyroid disease, Stroke, or Prostate cancer.  ROS:   Please see the history of present illness.     All other systems reviewed and are negative.   Prior CV studies:   The following studies were reviewed today:  Venous Duplex 02/07/19 TTE 10/13/18 Carotid 05/07/20    Labs/Other Tests and Data Reviewed:    EKG:   SR rate 61 nonspecific ST changes 12/14/19 12/15/2022 NSR rate 55 nonspecific ST changes 12/15/2022 Aflutter rate 68 no acute changes   Recent Labs: 07/01/2022: Hemoglobin 16.1; Platelets 248 10/09/2022: ALT 14; BUN 24; Creatinine, Ser 1.01;  Potassium 4.1; Sodium 143; TSH 3.64   Recent Lipid Panel Lab Results  Component Value Date/Time   CHOL 122 05/28/2022 10:47 AM   CHOL 161 10/31/2014 08:24 AM   TRIG 116.0 05/28/2022 10:47 AM   TRIG 81 10/31/2014 08:24 AM   TRIG 86 07/23/2006 10:12 AM   HDL 41.90 05/28/2022 10:47 AM   HDL 49 10/31/2014 08:24 AM   CHOLHDL 3 05/28/2022 10:47 AM   LDLCALC 56 05/28/2022 10:47 AM   LDLCALC 96 10/31/2014 08:24 AM  Wt Readings from Last 3 Encounters:  12/15/22 198 lb (89.8 kg)  10/09/22 201 lb (91.2 kg)  07/09/22 197 lb 15.6 oz (89.8 kg)     Objective:    Vital Signs:  BP 124/66   Pulse 78   Ht 5\' 9"  (1.753 m)   Wt 198 lb (89.8 kg)   SpO2 95%   BMI 29.24 kg/m    Affect appropriate Healthy:  appears stated age HEENT: normal Neck supple with no adenopathy JVP normal no bruits no thyromegaly Lungs clear with no wheezing and good diaphragmatic motion Heart:  S1/S2 no murmur, no rub, gallop or click PMI normal Abdomen: benighn, BS positve, no tenderness, no AAA no bruit.  No HSM or HJR Distal pulses intact with no bruits Plus 2 RLE edema plus 1 LLE with varicosities Neuro intention tremor worse on left arm  Skin warm and dry Post right TKR     ASSESSMENT & PLAN:    LE edema: venous reflux f/u Dr Donzetta Matters continue f/u with wound center  Continue lasix and local care   CAD: DES to circumflex 2006  stable with low risk Myoview 03/31/16 . Continue ASA, statin and BB   Carotid artery disease: stable doppler study in 05/07/20  Plaque no significant stenosis . Repeat due in 2 years ( 04/2022  )   HLD: continue statin. Followed by Dr. Larose Kells LDL 68 08/04/21    Hypothyroidism: continue synthroid followed by Dr. Larose Kells TSH 6.8   Orho:  Post right TKR and lumbar surgery     Afib:  New diagnosis  09/28/18 On Eliquis 5 bid for CHADVASC 4.  Continue cardizem in NSR today   Gait:  post shunt for hydrocephalus F/U Dr Arnoldo Morale   Medication Adjustments/Labs and Tests Ordered: Current  medicines are reviewed at length with the patient today.  Concerns regarding medicines are outlined above.   Tests Ordered:  None  Medication Changes:  None   Disposition:  Follow up in 6 months  Signed, Jenkins Rouge, MD  12/15/2022 10:28 AM    [facility]

## 2022-12-10 ENCOUNTER — Ambulatory Visit: Payer: Medicare Other | Admitting: Physical Therapy

## 2022-12-10 ENCOUNTER — Encounter: Payer: Self-pay | Admitting: Physical Therapy

## 2022-12-10 DIAGNOSIS — R2689 Other abnormalities of gait and mobility: Secondary | ICD-10-CM

## 2022-12-10 DIAGNOSIS — M6281 Muscle weakness (generalized): Secondary | ICD-10-CM | POA: Diagnosis not present

## 2022-12-10 NOTE — Therapy (Signed)
OUTPATIENT PHYSICAL THERAPY TREATMENT NOTE    Patient Name: [name] MRN: [id number] DOB:06-27-1936, 87 y.o., male Today's Date: 12/10/2022  END OF SESSION:   PT End of Session - 12/10/22 1449     Visit Number 21    Date for PT Re-Evaluation 12/17/22    Authorization Type [employer] Medicare    PT Start Time 1448    PT Stop Time 1520    PT Time Calculation (min) 32 min    Activity Tolerance Patient tolerated treatment well    Behavior During Therapy [facility] for tasks assessed/performed                              Past Medical History:  Diagnosis Date   A-fib ([place])    Arthritis    BPH (benign prostatic hypertrophy)    Carotid artery disease ([place])    Doppler, December 08, 2011, 00 [id number] R. ICA, [id number] LICA, followup 1 year   Coronary artery disease    DES Circumflex or CVA 2006  /  clear, February, 2011, EF 70%, no ischemia or   Ejection fraction    EF 60%, echo, 2009, mildly calcified aortic leaflets   History of colonic polyps    Hyperlipidemia    Hypertension    Hypothyroidism    Lumbar radiculopathy    Multiple thyroid nodules    Avascular echogenic areas noted in the right thyroid at the time of carotid Doppler.    PONV (postoperative nausea and vomiting)    "nausea with first hip surgery"   Skin cancer (melanoma) ([place])    PMH of    Spinal stenosis, lumbar    Tremor    Fine tremor right upper extremity   Venous insufficiency    Past Surgical History:  Procedure Laterality Date   COLONOSCOPY     CORONARY ANGIOPLASTY WITH STENT PLACEMENT  2006   x 2 stents; DES to CX and dRCA '06   KNEE SURGERY  1970's   "chipped bone"   LUMBAR LAMINECTOMY/DECOMPRESSION MICRODISCECTOMY Left 07/13/2016   Procedure: LUMBAR LAMINECTOMY AND FORAMINOTOMY Lumbar two, three, Lumbar three-four, Lumbar four-five, LEFT Lumbar five-Sacral one DISECTOMY;  Surgeon: Newman Pies, MD;  Location: [place];  Service: Neurosurgery;  Laterality: Left;   TOE SURGERY  1997    TONSILLECTOMY     TOTAL HIP ARTHROPLASTY Left 2007   TOTAL HIP ARTHROPLASTY Right 2010   TOTAL KNEE ARTHROPLASTY Right 06/25/2014   Procedure: RIGHT TOTAL KNEE ARTHROPLASTY;  Surgeon: Gearlean Alf, MD;  [facility];  Service: Orthopedics;  Laterality: Right;   VENTRICULOPERITONEAL SHUNT Right 07/09/2022   Procedure: SHUNT INSERTION VENTRICULAR-PERITONEAL;  Surgeon: Newman Pies, MD;  Location: [place];  Service: Neurosurgery;  Laterality: Right;   Patient Active Problem List   Diagnosis Date Noted   (Idiopathic) normal pressure hydrocephalus ([place]) 07/09/2022   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[place]) 02/05/2022   Gait disturbance 02/05/2022   Benign essential tremor 02/05/2022   Chronic anticoagulation 02/05/2022   Atrial fibrillation ([place]) 02/08/2019   Lumbar stenosis with neurogenic claudication 07/13/2016   Annual physical exam>>>>>>>>>>>>>>>>>> 05/04/2016   PCP NOTES>>>>>>>>>>>>>>>>>>>>>>>>>>> 12/31/2015   Edema 02/19/2015   OA (osteoarthritis) of knee 06/25/2014   Unspecified sleep apnea 06/18/2014   Multiple thyroid nodules    Tremor    Carotid artery disease ([facility])    Erectile dysfunction 05/31/2012   Coronary artery disease    Venous insufficiency    Hyperglycemia 11/09/2008   MELANOMA, HX OF  11/09/2008   COLONIC POLYPS, HYPERPLASTIC 05/10/2008   Hypothyroidism 08/10/2007   HYPERLIPIDEMIA 08/10/2007   Essential hypertension 08/10/2007   DJD (degenerative joint disease) 08/08/2007   MURMUR 08/08/2007   BENIGN PROSTATIC HYPERTROPHY, HX OF 08/05/2007     THERAPY DIAG:  Other abnormalities of gait and mobility  Muscle weakness (generalized)   PCP: [name], MD   REFERRING PROVIDER: Newman Pies, MD   REFERRING DIAG: G91.1 (ICD-10-CM) - Obstructive hydrocephalus R26.9 (ICD-10-CM) - Gait disturbance   Rationale for Evaluation and Treatment: Rehabilitation   THERAPY DIAG:  Other abnormalities of gait and mobility   Muscle weakness (generalized)   ONSET  DATE: chronic - has done PT before multiple times for strength, balance etc   SUBJECTIVE:       I am so glad I came to PT.  My walking feels so much better.                                                                                   SUBJECTIVE STATEMENT:   Eval: Pt is referred to OPPT for gait training following VP shunt placement 07/09/22 for communicating hydrocephalus. Has done PT multiple times before and wants to do it again for strength, walking and balance.  He uses a SPC in community and either furniture surfs or uses cane at home. He feels he can do some walking without it.   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  His wife reports he can't go shopping with her due to poor endurance.    PERTINENT HISTORY:  VP shunt placed 07/09/22 for communicating hydrocephalus PMH includes lumbar surgery, bil THA, Rt TKA, a-fib, CAD, coronary angioplasty with stent, compromised ejection fraction   PAIN:  PAIN:  Are you having pain? No       PRECAUTIONS: Other: heart history, VP shunt placed 06/2022 for communicating hydrocephalus   WEIGHT BEARING RESTRICTIONS: No   FALLS:  Has patient fallen in last 6 months? No   LIVING ENVIRONMENT: Lives with: lives with their spouse Lives in: House/apartment Stairs: Yes: Internal: 14 steps; can reach both and External: 4 steps; can reach both, bedroom on main level  Has following equipment at home: Single point cane - uses it in community and furniture surfs at home or uses cane   OCCUPATION: retired   PLOF: Independent with household mobility with device and Independent with community mobility with device   PATIENT GOALS: strength, balance and gait   NEXT MD VISIT:    OBJECTIVE:    DIAGNOSTIC FINDINGS:  N/a   PATIENT SURVEYS:      SCREENING FOR RED FLAGS: Bowel or bladder incontinence: Yes: leaking after urination and if gets too full Spinal tumors: No Cauda equina syndrome: No Compression fracture:  No Abdominal aneurysm: No   COGNITION: Overall cognitive status: Within functional limits for tasks assessed                          SENSATION: WFL   MUSCLE LENGTH: Hamstrings: Right 45 deg; Left 45 deg     POSTURE:  10/13/22: improved postural awareness, needs intermittent cueing to stand tall  Evaluation: flexed trunk  and weight shift right   PALPATION: N/a   LUMBAR ROM:    AROM eval 10/13/22 10/22/22 12/10/22  Flexion Flexes knees with fingers to mid shin Flexes knees with fingers to mid shin Flexes knees with fingers to mid shin   Extension NT 8 8   Right lateral flexion 5 8 8    Left lateral flexion 8 8 8    Right rotation 25% 50% 50%   Left rotation 25% 50% 50%    (Blank rows = not tested)   LOWER EXTREMITY ROM:    Hip ROM grossly limited in ER 50% bil   LOWER EXTREMITY MMT:   12/10/22: 5/5 bil LE  10/13/22: 4+/5 bil LE  Eval: Grossly 4/5 on Rt Poor glut med and quad activation in stance phase of gait   LUMBAR SPECIAL TESTS:      FUNCTIONAL TESTS:  12/10/22: 5x sit to stand:   12/08/22 5 times sit to stand: 16.85 first trial, 15:29 second trial TUG: 12.99 sec with SPC, 12.22 sec no cane  10/22/22: 5 times sit to stand: 14 sec no UE Timed up and go (TUG): 17 sec with SPC, 16 sec with SPC Berg Balance Scale: 50/56   10/08/22:  5x sit to stand no UE assist 14 sec TUG:16 sec with SPC  Eval: 5 times sit to stand: 17 sec mod/max use of hands on chair Timed up and go (TUG): 22 sec with SPC Berg Balance Scale: 39/56   GAIT: Distance walked: 3MWT Assistive device utilized: Single point cane Level of assistance: Modified independence Comments: with SPC, covers 340' with SOB at 1:30, needs seated break   TODAY'S TREATMENT:  12/10/22: NuStep L5 x 5'  4 min walk: covered 276' taking 2 short standing breaks Leg press seat 9 90lb bil LE 1x25, 40lb single leg 1x10 each LE Standing blue tband bil row x15, bil shoulder ext x 10 Hip matrix 1x10 40lb abd and  ext bil Verbal review of HEP (stairs 1x/day using single rail and step to pattern, standing blue tband row and ext, hip extabd at counter, short bouts of walking several times/day, sit to stand reps)  12/08/22: NuStep L5 x 10' PT present to discuss goals 5x STS 2 trials TUG without and with SPC 2 trials Leg press seat 9 90lb bil LE 1x25, 40lb single leg 2x10 each LE Standing blue tband bil row x25, bil shoulder extension x15 Hip matrix 1x15 40lb abd and ext bil  11/26/22: NuStep L5 x10' PT present to monitor - covered nearly 1/2 mile Hip matrix 1x15 30lb abd and ext bil Outdoor ambulation with SPC and gait belt x 8 min, CGA: level surface, grassy surface, incline/decline, curbs, ramps - needed 2 standing breaks today for SOB Leg press seat 9 90lb bil LE 2x20, 40lb single leg 1x15 each LE   PATIENT EDUCATION:  Education details: Access Code: [email or web address] Person educated: Patient and Spouse Education method: Explanation, Demonstration, and Handouts Education comprehension: verbalized understanding and returned demonstration   HOME EXERCISE PROGRAM: Access Code: [email or web address] URL: [email or web address] Date: 09/30/2022 Prepared by: Venetia Night Tishie Altmann  Exercises - Seated Long Arc Quad  - 1 x daily - 7 x weekly - 1 sets - 10 reps - 5 hold - Standing Hip Abduction with Counter Support  - 1 x daily - 7 x weekly - 1 sets - 10 reps - Standing Hip Extension with Counter Support  - 1 x daily - 7 x weekly - 1 sets - 10 reps -  Heel Raises with Counter Support  - 1 x daily - 7 x weekly - 1 sets - 10 reps - Seated Hip Adduction Isometrics with Ball  - 1 x daily - 7 x weekly - 1 sets - 10 reps - Seated March  - 1 x daily - 7 x weekly - 1 sets - 10 reps - Ankle Pumps in Elevation  - 1 x daily - 7 x weekly - 1 sets - 10 reps - Forward Step Up  - 3 x daily - 7 x weekly - 2 sets - 10 reps - Standing Forward Toe Taps on Box (BKA)  - 3 x daily - 7 x weekly - 2 sets - 10 reps - Sit to Stand Without  Arm Support  - 3 x daily - 7 x weekly - 1 sets - 10 reps - Standing Row with Anchored Resistance  - 1 x daily - 7 x weekly - 2 sets - 10 reps - Shoulder extension with resistance - Neutral  - 1 x daily - 7 x weekly - 2 sets - 10 reps   ASSESSMENT:   CLINICAL IMPRESSION: Pt shows signif improvement in gait stability and endurance.  Strength of bil LE is much improved and allows for more functional task performance with safety.  He is able to safely ascend and descend stairs using single rail and step to pattern.  He uses SPC and is able to participate in indoor and outdoor ambulation for up to 6 min with short standing breaks to catch his breath.  Strength of bil LE measures 5/5 bil.  Pt and his wife are very pleased with his progress.  He is ready to d/c to HEP.  He has met all goals.       OBJECTIVE IMPAIRMENTS: Abnormal gait, cardiopulmonary status limiting activity, decreased activity tolerance, decreased balance, decreased coordination, decreased endurance, decreased knowledge of condition, decreased mobility, difficulty walking, decreased ROM, decreased strength, decreased safety awareness, hypomobility, increased edema, impaired flexibility, improper body mechanics, and postural dysfunction.    ACTIVITY LIMITATIONS: carrying, lifting, bending, standing, squatting, stairs, transfers, dressing, and locomotion level   PARTICIPATION LIMITATIONS: meal prep, cleaning, laundry, shopping, community activity, and yard work   PERSONAL FACTORS: Age, Time since onset of injury/illness/exacerbation, and 3+ comorbidities: + cardiac history, hydrocephalus with shunt, LE edema, Rt LE weakness with Hx of THA and TKA  are also affecting patient's functional outcome.    REHAB POTENTIAL: Good   CLINICAL DECISION MAKING: Stable/uncomplicated   EVALUATION COMPLEXITY: Low     GOALS: Goals reviewed with patient? Yes   SHORT TERM GOALS: Target date: 09/24/22   Pt will be ind with initial  HEP Baseline: Goal status: met   2.  Pt will improve 5X sit to stand to </= 15 sec with min use of UE Baseline: 17 sec with mod/max use of UE Goal status: met, 14 sec   3.  Pt will demo improved quad and lateral hip muscle activations on Rt with stance phase of gait. Baseline:  Goal status: met   4.  Improve TUG to 19 sec or less. Baseline: 22 sec Goal status: met, 16 sec      LONG TERM GOALS: Target date: 10/22/22   Pt will be ind with advanced HEP to maintain gains from PT. Baseline:  Goal status: met   2.  Pt will improve TUG to 17 sec or less to demo improved efficiency and safety with functional mobility. Baseline:  Goal status: met, 16 sec  3.  Improve 5X sit to stand to 13 sec or less with min or no use of UE to demo reduced fall risk Baseline: 17 sec Goal status: partially met14 sec   4.  Pt will demo improved gait endurance to allow for a 3 min walk test. Baseline: covered 350' on 10/20/22 Goal status: MET   5.  Pt will improve LE strength to at least 4+/5 on Rt for improved gait safety, transfers and stairs Baseline: 4/5 Goal status: met   6.  Improve BERG Balance Test to at least 44/52 to demo improved static and dynamic balance to reduce fall risk. Baseline: 39/52 Goal status: met on 10/13/22   PLAN:   PT FREQUENCY: 2x/week   PT DURATION: 8 weeks   PLANNED INTERVENTIONS: Therapeutic exercises, Therapeutic activity, Neuromuscular re-education, Balance training, Gait training, Patient/Family education, Self Care, Joint mobilization, Stair training, DME instructions, Electrical stimulation, Spinal mobilization, Cryotherapy, Moist heat, and Manual therapy.   PLAN FOR NEXT SESSION: d/c to HEP  PHYSICAL THERAPY DISCHARGE SUMMARY  Visits from Start of Care: 21  Current functional level related to goals / functional outcomes: See above   Remaining deficits: See above   Education / Equipment: HEP   Patient agrees to discharge. Patient goals were met.  Patient is being discharged due to meeting the stated rehab goals.    Earlee Herald, PT 12/10/22 3:29 PM

## 2022-12-11 ENCOUNTER — Encounter (HOSPITAL_BASED_OUTPATIENT_CLINIC_OR_DEPARTMENT_OTHER): Payer: Medicare Other | Admitting: General Surgery

## 2022-12-11 DIAGNOSIS — Z7901 Long term (current) use of anticoagulants: Secondary | ICD-10-CM | POA: Diagnosis not present

## 2022-12-11 DIAGNOSIS — L988 Other specified disorders of the skin and subcutaneous tissue: Secondary | ICD-10-CM | POA: Diagnosis not present

## 2022-12-11 DIAGNOSIS — Z955 Presence of coronary angioplasty implant and graft: Secondary | ICD-10-CM | POA: Diagnosis not present

## 2022-12-11 DIAGNOSIS — L97222 Non-pressure chronic ulcer of left calf with fat layer exposed: Secondary | ICD-10-CM | POA: Diagnosis not present

## 2022-12-11 DIAGNOSIS — Z79899 Other long term (current) drug therapy: Secondary | ICD-10-CM | POA: Diagnosis not present

## 2022-12-11 DIAGNOSIS — I2510 Atherosclerotic heart disease of native coronary artery without angina pectoris: Secondary | ICD-10-CM | POA: Diagnosis not present

## 2022-12-11 DIAGNOSIS — I872 Venous insufficiency (chronic) (peripheral): Secondary | ICD-10-CM | POA: Diagnosis not present

## 2022-12-11 DIAGNOSIS — I480 Paroxysmal atrial fibrillation: Secondary | ICD-10-CM | POA: Diagnosis not present

## 2022-12-11 DIAGNOSIS — Z85828 Personal history of other malignant neoplasm of skin: Secondary | ICD-10-CM | POA: Diagnosis not present

## 2022-12-11 DIAGNOSIS — I10 Essential (primary) hypertension: Secondary | ICD-10-CM | POA: Diagnosis not present

## 2022-12-11 DIAGNOSIS — L989 Disorder of the skin and subcutaneous tissue, unspecified: Secondary | ICD-10-CM | POA: Diagnosis not present

## 2022-12-12 NOTE — Progress Notes (Signed)
[name], [name] ([id number]) [id number] Page 1 of 7 Visit Report for 12/11/2022 Arrival Information Details Patient Name: Date of Service: [name], [name] [name] 12/11/2022 11:15 A M Medical Record Number: [id number] Patient Account Number: [id number] Date of Birth/Sex: Treating RN: [birth year] (87 y.o. [name] Primary Care Livio Ledwith: [name] Other Clinician: Referring Sanyia Dini: Treating Aden Sek/Extender: [name] in Treatment: 17 Visit Information History Since Last Visit Added or deleted any medications: No Patient Arrived: [name] Any new allergies or adverse reactions: No Arrival Time: 11:18 Had a fall or experienced change in No Accompanied By: wife activities of daily living that may affect Transfer Assistance: None risk of falls: Patient Identification Verified: Yes Signs or symptoms of abuse/neglect since last visito No Secondary Verification Process Completed: Yes Hospitalized since last visit: No Patient Requires Transmission-Based Precautions: No Implantable device outside of the clinic excluding No Patient Has Alerts: No cellular tissue based products placed in the center since last visit: Has Compression in Place as Prescribed: Yes Pain Present Now: No Electronic Signature(s) Signed: 12/11/2022 4:08:25 PM By: Blanche East RN Entered By: Blanche East on 12/11/2022 11:18:38 -------------------------------------------------------------------------------- Compression Therapy Details Patient Name: Date of Service: [name], [name] [name] 12/11/2022 11:15 A M Medical Record Number: [id number] Patient Account Number: [id number] Date of Birth/Sex: Treating RN: October 01, 1935 (87 y.o. [name] Primary Care Cordelro Gautreau: [name] Other Clinician: Referring Millette Halberstam: Treating Oaklie Durrett/Extender: [name] in Treatment: 17 Compression Therapy Performed for Wound Assessment: Wound #3 Left,Posterior Lower  Leg Performed By: Clinician Blanche East, RN Compression Type: Three Layer Post Procedure Diagnosis Same as Pre-procedure Notes urgo lite Electronic Signature(s) Signed: 12/11/2022 4:08:25 PM By: Blanche East RN Entered By: Blanche East on 12/11/2022 11:29:09 -------------------------------------------------------------------------------- Encounter Discharge Information Details Patient Name: Date of Service: [name], [name] [name] 12/11/2022 11:15 A M Medical Record Number: [id number] Patient Account Number: [id number] Date of Birth/Sex: Treating RN: [birth year] (87 y.o. [name] Primary Care Jerrico Covello: [name] Other Clinician: Referring Makenzee Choudhry: Treating Jenavee Laguardia/Extender: [name] in Treatment: 17 Encounter Discharge Information Items Post Procedure Vitals Discharge Condition: Stable Temperature (F): 97.6 Ambulatory Status: Ambulatory Pulse (bpm): [address] [name] ([id number]) [id number] Page 2 of 7 Discharge Destination: Home Respiratory Rate (breaths/min): 18 Transportation: Private Auto Blood Pressure (mmHg): 142/79 Accompanied By: self Schedule Follow-up Appointment: Yes Clinical Summary of Care: Electronic Signature(s) Signed: 12/11/2022 11:47:46 AM By: Blanche East RN Entered By: Blanche East on 12/11/2022 11:47:46 -------------------------------------------------------------------------------- Lower Extremity Assessment Details Patient Name: Date of Service: [name], [name] [name] 12/11/2022 11:15 A M Medical Record Number: [id number] Patient Account Number: [id number] Date of Birth/Sex: Treating RN: 1936/05/02 (87 y.o. [name] Primary Care Shannah Conteh: [name] Other Clinician: Referring Carvin Almas: Treating Kirby Argueta/Extender: [name] in Treatment: 17 Edema Assessment Assessed: [Left: No] [Right: No] [Left: Edema] [Right: :] Calf Left: Right: Point of Measurement: From  Medial Instep 35.8 cm Ankle Left: Right: Point of Measurement: From Medial Instep 21.2 cm Vascular Assessment Pulses: Dorsalis Pedis Palpable: [Left:Yes] Electronic Signature(s) Signed: 12/11/2022 4:08:25 PM By: Blanche East RN Entered By: Blanche East on 12/11/2022 11:19:36 -------------------------------------------------------------------------------- Multi Wound Chart Details Patient Name: Date of Service: [name], [name] [name] 12/11/2022 11:15 A M Medical Record Number: [id number] Patient Account Number: [id number] Date of Birth/Sex: Treating RN: 10-02-1935 (87 y.o. M) Primary Care Tersa Fotopoulos: [name] Other Clinician: Referring Aliahna Statzer: Treating Onofrio Klemp/Extender: [name] in Treatment: 17 Vital Signs Height(in):  Pulse(bpm): 71 Weight(lbs): Blood Pressure(mmHg): 142/79 Body Mass Index(BMI): Temperature(F): Respiratory Rate(breaths/min): 16 [3:Photos:] [N/A:N/A] Left, Posterior Lower Leg N/A N/A Wound Location: Puncture N/A N/A Wounding Event: Lesion N/A N/A Primary Etiology: Glaucoma, Arrhythmia, Coronary N/A N/A Comorbid History: Artery Disease, Hypertension, Peripheral Venous Disease, Osteoarthritis 08/12/2022 N/A N/A Date Acquired: 15 N/A N/A Weeks of Treatment: Open N/A N/A Wound Status: No N/A N/A Wound Recurrence: 1.6x1.4x0.2 N/A N/A Measurements L x W x D (cm) 1.759 N/A N/A A (cm) : rea 0.352 N/A N/A Volume (cm) : -2377.50% N/A N/A % Reduction in A rea: -2414.30% N/A N/A % Reduction in Volume: Full Thickness Without Exposed N/A N/A Classification: Support Structures Medium N/A N/A Exudate A mount: Serosanguineous N/A N/A Exudate Type: red, brown N/A N/A Exudate Color: Distinct, outline attached N/A N/A Wound Margin: Medium (34-66%) N/A N/A Granulation A mount: Red N/A N/A Granulation Quality: Medium (34-66%) N/A N/A Necrotic A mount: Eschar, Adherent Slough N/A N/A Necrotic Tissue: Fat Layer  (Subcutaneous Tissue): Yes N/A N/A Exposed Structures: Fascia: No Tendon: No Muscle: No Joint: No Bone: No Small (1-33%) N/A N/A Epithelialization: Debridement - Excisional N/A N/A Debridement: Pre-procedure Verification/Time Out 11:25 N/A N/A Taken: Subcutaneous, Slough N/A N/A Tissue Debrided: Skin/Subcutaneous Tissue N/A N/A Level: 2.24 N/A N/A Debridement A (sq cm): rea Curette N/A N/A Instrument: Minimum N/A N/A Bleeding: Pressure N/A N/A Hemostasis A chieved: Procedure was tolerated well N/A N/A Debridement Treatment Response: 1.6x1.4x0.1 N/A N/A Post Debridement Measurements L x W x D (cm) 0.176 N/A N/A Post Debridement Volume: (cm) No Abnormalities Noted N/A N/A Periwound Skin Texture: No Abnormalities Noted N/A N/A Periwound Skin Moisture: Hemosiderin Staining: Yes N/A N/A Periwound Skin Color: No Abnormality N/A N/A Temperature: Compression Therapy N/A N/A Procedures Performed: Debridement Treatment Notes Wound #3 (Lower Leg) Wound Laterality: Left, Posterior Cleanser Soap and Water Discharge Instruction: May shower and wash wound with dial antibacterial soap and water prior to dressing change. Wound Cleanser Discharge Instruction: Cleanse the wound with wound cleanser prior to applying a clean dressing using gauze sponges, not tissue or cotton balls. Peri-Wound Care Sween Lotion (Moisturizing lotion) Discharge Instruction: Apply moisturizing lotion as directed Topical Primary Dressing Promogran Prisma Matrix, 4.34 (sq in) (silver collagen) Discharge Instruction: Moisten collagen with saline or hydrogel Secondary Dressing ABD Pad, 5x9 Discharge Instruction: Apply over primary dressing as directed. Woven Gauze Sponge, Non-Sterile 4x4 in [place] [name] ([id number]) [id number] Page 4 of 7 Discharge Instruction: Apply over primary dressing as directed. Secured With Compression Wrap ThreePress (3 layer compression  wrap) Discharge Instruction: Apply three layer compression as directed. Compression Stockings Add-Ons Electronic Signature(s) Signed: 12/11/2022 11:56:51 AM By: Fredirick Maudlin MD FACS Entered By: Fredirick Maudlin on 12/11/2022 11:56:51 -------------------------------------------------------------------------------- Multi-Disciplinary Care Plan Details Patient Name: Date of Service: [name], [name] [name] 12/11/2022 11:15 A M Medical Record Number: [id number] Patient Account Number: [id number] Date of Birth/Sex: Treating RN: Apr 23, 1936 (87 y.o. [name] Primary Care Kissa Campoy: [name] Other Clinician: Referring Adamary Savary: Treating Cherylin Waguespack/Extender: [name] in Treatment: 17 Active Inactive Necrotic Tissue Nursing Diagnoses: Impaired tissue integrity related to necrotic/devitalized tissue Knowledge deficit related to management of necrotic/devitalized tissue Goals: Necrotic/devitalized tissue will be minimized in the wound bed Date Initiated: 08/25/2022 Target Resolution Date: 12/18/2022 Goal Status: Active Patient/caregiver will verbalize understanding of reason and process for debridement of necrotic tissue Date Initiated: 08/25/2022 Target Resolution Date: 12/18/2022 Goal Status: Active Interventions: Assess patient pain level pre-, during and post procedure and prior to discharge Provide education on  necrotic tissue and debridement process Treatment Activities: Apply topical anesthetic as ordered : 08/25/2022 Notes: Electronic Signature(s) Signed: 12/11/2022 4:08:25 PM By: Blanche East RN Entered By: Blanche East on 12/11/2022 11:25:15 -------------------------------------------------------------------------------- Pain Assessment Details Patient Name: Date of Service: [name], [name] [name] 12/11/2022 11:15 A M Medical Record Number: [id number] Patient Account Number: [id number] Date of Birth/Sex: Treating RN: 1935-11-02 (87 y.o. [name] Primary Care Aamani Moose: [name] Other Clinician: Referring Clelia Trabucco: Treating Merry [place]/Extender: [name] in Treatment: [address] CATHERINE, SAMA [name] ([id number]) [id number] Page 5 of 7 Location of Pain Severity and Description of Pain Patient Has Paino No Site Locations Rate the pain. Current Pain Level: 0 Pain Management and Medication Current Pain Management: Electronic Signature(s) Signed: 12/11/2022 4:08:25 PM By: Blanche East RN Entered By: Blanche East on 12/11/2022 11:19:20 -------------------------------------------------------------------------------- Patient/Caregiver Education Details Patient Name: Date of Service: [name], [name] [name] 3/22/2024andnbsp11:15 A M Medical Record Number: [id number] Patient Account Number: [id number] Date of Birth/Gender: Treating RN: 23-Nov-1935 (87 y.o. [name] Primary Care Physician: [name] Other Clinician: Referring Physician: Treating Physician/Extender: [name] in Treatment: 17 Education Assessment Education Provided To: Patient Education Topics Provided Wound Debridement: Methods: Explain/Verbal Responses: Reinforcements needed, State content correctly Wound/Skin Impairment: Methods: Explain/Verbal Responses: Reinforcements needed, State content correctly Electronic Signature(s) Signed: 12/11/2022 4:08:25 PM By: Blanche East RN Entered By: Blanche East on 12/11/2022 11:25:38 -------------------------------------------------------------------------------- Wound Assessment Details Patient Name: Date of Service: [name], [name] [name] 12/11/2022 11:15 A M Medical Record Number: [id number] Patient Account Number: [id number] Date of Birth/Sex: Treating RN: 06-25-1936 (87 y.o. [name] Primary Care Cassady Turano: [name] Other Clinician: Referring Emmah Bratcher: Treating Raymund Manrique/Extender: [name] in  Treatment: [address], [place] ([id number]) [id number] Page 6 of 7 Wound Status Wound Number: 3 Primary Lesion Etiology: Wound Location: Left, Posterior Lower Leg Wound Open Wounding Event: Puncture Status: Date Acquired: 08/12/2022 Comorbid Glaucoma, Arrhythmia, Coronary Artery Disease, Hypertension, Weeks Of Treatment: 15 History: Peripheral Venous Disease, Osteoarthritis Clustered Wound: No Photos Wound Measurements Length: (cm) 1.6 Width: (cm) 1.4 Depth: (cm) 0.2 Area: (cm) 1.759 Volume: (cm) 0.352 % Reduction in Area: -2377.5% % Reduction in Volume: -2414.3% Epithelialization: Small (1-33%) Tunneling: No Undermining: No Wound Description Classification: Full Thickness Without Exposed Support Structures Wound Margin: Distinct, outline attached Exudate Amount: Medium Exudate Type: Serosanguineous Exudate Color: red, brown Foul Odor After Cleansing: No Slough/Fibrino Yes Wound Bed Granulation Amount: Medium (34-66%) Exposed Structure Granulation Quality: Red Fascia Exposed: No Necrotic Amount: Medium (34-66%) Fat Layer (Subcutaneous Tissue) Exposed: Yes Necrotic Quality: Eschar, Adherent Slough Tendon Exposed: No Muscle Exposed: No Joint Exposed: No Bone Exposed: No Periwound Skin Texture Texture Color No Abnormalities Noted: Yes No Abnormalities Noted: No Hemosiderin Staining: Yes Moisture No Abnormalities Noted: Yes Temperature / Pain Temperature: No Abnormality Treatment Notes Wound #3 (Lower Leg) Wound Laterality: Left, Posterior Cleanser Soap and Water Discharge Instruction: May shower and wash wound with dial antibacterial soap and water prior to dressing change. Wound Cleanser Discharge Instruction: Cleanse the wound with wound cleanser prior to applying a clean dressing using gauze sponges, not tissue or cotton balls. Peri-Wound Care Sween Lotion (Moisturizing lotion) Discharge Instruction: Apply moisturizing lotion as  directed Topical Primary Dressing Promogran Prisma Matrix, 4.34 (sq in) (silver collagen) Discharge Instruction: Moisten collagen with saline or hydrogel GIOVANI, FEOLE [name] ([id number]) [id number] Page 7 of 7 Secondary Dressing ABD Pad, 5x9 Discharge Instruction: Apply over primary dressing as directed. Woven Gauze  Sponge, Non-Sterile 4x4 in Discharge Instruction: Apply over primary dressing as directed. Secured With Compression Wrap ThreePress (3 layer compression wrap) Discharge Instruction: Apply three layer compression as directed. Compression Stockings Add-Ons Electronic Signature(s) Signed: 12/11/2022 4:08:25 PM By: Blanche East RN Entered By: Blanche East on 12/11/2022 11:22:51 -------------------------------------------------------------------------------- Vitals Details Patient Name: Date of Service: [name], [name] [name] 12/11/2022 11:15 A M Medical Record Number: [id number] Patient Account Number: [id number] Date of Birth/Sex: Treating RN: 03/24/1936 (87 y.o. [name] Primary Care Bethany Hirt: [name] Other Clinician: Referring Dalisa Forrer: Treating Eaven Schwager/Extender: [name] in Treatment: 17 Vital Signs Time Taken: 11:15 Pulse (bpm): 71 Respiratory Rate (breaths/min): 16 Blood Pressure (mmHg): 142/79 Reference Range: 80 - 120 mg / dl Electronic Signature(s) Signed: 12/11/2022 4:08:25 PM By: Blanche East RN Entered By: Blanche East on 12/11/2022 11:18:59

## 2022-12-12 NOTE — Progress Notes (Signed)
[name], [name] ([id number]) [id number] Page 1 of 9 Visit Report for 12/11/2022 Chief Complaint Document Details Patient Name: Date of Service: [name], [name] 12/11/2022 11:15 A M Medical Record Number: [id number] Patient Account Number: [id number] Date of Birth/Sex: Treating RN: 04-16-1936 (87 y.o. M) Primary Care Provider: Kathlene [name] Other Clinician: Referring Provider: Treating Provider/Extender: [name] in Treatment: 17 Information Obtained from: Patient Chief Complaint Left lower extremity wound Electronic Signature(s) Signed: 12/11/2022 11:56:58 AM By: [name] Maudlin MD FACS Entered By: [name] on 12/11/2022 11:56:57 -------------------------------------------------------------------------------- Debridement Details Patient Name: Date of Service: [name], [name] 12/11/2022 11:15 A M Medical Record Number: [id number] Patient Account Number: [id number] Date of Birth/Sex: Treating RN: 1935-11-11 (87 y.o. [name] Primary Care Provider: Kathlene [name] Other Clinician: Referring Provider: Treating Provider/Extender: [name] in Treatment: 17 Debridement Performed for Assessment: Wound #3 Left,Posterior Lower Leg Performed By: Physician [name] Maudlin, MD Debridement Type: Debridement Level of Consciousness (Pre-procedure): Awake and Alert Pre-procedure Verification/Time Out Yes - 11:25 Taken: Start Time: 11:26 T Area Debrided (L x W): otal 1.6 (cm) x 1.4 (cm) = 2.24 (cm) Tissue and other material debrided: Viable, Slough, Subcutaneous, Slough Level: Skin/Subcutaneous Tissue Debridement Description: Excisional Instrument: Curette Bleeding: Minimum Hemostasis Achieved: Pressure Response to Treatment: Procedure was tolerated well Level of Consciousness (Post- Awake and Alert procedure): Post Debridement Measurements of Total Wound Length: (cm) 1.6 Width: (cm) 1.4 Depth: (cm)  0.1 Volume: (cm) 0.176 Character of Wound/Ulcer Post Debridement: Requires Further Debridement Post Procedure Diagnosis Same as Pre-procedure Notes Scribed for Dr. Celine [name] by [name] East, RN Electronic Signature(s) Signed: 12/11/2022 1:35:04 PM By: [name] Maudlin MD FACS Signed: 12/11/2022 4:08:25 PM By: [name] East RN Entered By: [name] on 12/11/2022 11:27:48 [name], [name] ([id number]) [id number] Page 2 of 9 -------------------------------------------------------------------------------- HPI Details Patient Name: Date of Service: [name], [name] 12/11/2022 11:15 A M Medical Record Number: [id number] Patient Account Number: [id number] Date of Birth/Sex: Treating RN: September 22, 1935 (87 y.o. M) Primary Care Provider: Kathlene [name] Other Clinician: Referring Provider: Treating Provider/Extender: [name] in Treatment: 17 History of Present Illness HPI Description: [name] is an 87 year old male with a past medical history of CAD s/p DES to LCx 2006, carotid artery disease, hypertension, hyperlipidemia, paroxysmal atrial fibrillation on Eliquis and Cardizem that presents today for right lower extremity wound. He was seen by [name], his primary care provider on 4/29 for this issue and was referred to our clinic. Patient states that he had a small wound that spontaneously started in October 2021 and has not healed. He states this has progressively gotten larger. He has been using acne cream prescribed by the dermatologist for this issue. He reports drainage to the wound but no purulent drainage. He reports mild soreness to the wound. He has swelling in his right leg greater than left and reports this is a chronic issue for the past 1 to 2 years. He denies resting leg pain or pain with ambulation. Of note He was prescribed doxycycline at the end of last month and has finished this course for possible skin infection to the wound  site. 02/06/2021; I am seeing this patient who was admitted to the clinic last week by [name] He has predominantly I think a venous insufficiency wound on the right medial lower leg he has been using Santyl Hydrofera Blue under 3 layer compression. Arterial studies were ordered last week but do not seem to  been put through. He is not a diabetic. He did have venous reflux studies done in August 2020. He did have abnormal reflux time was noted in the great saphenous vein in the distal thigh and the great saphenous vein at the knee. There was no evidence of DVT or SVT at the time he was not felt to have large enough saphenous veins on either side for intervention. Compression stockings at least knee-high were recommended. Follow-up was on a as needed basis with [name] The patient does not describe claudication but his pulses in his feet are not vibrant this could be because of the swelling 5/26; patient presents for 1 week follow-up. He has been using Hydrofera Blue under 3 layer compression. He had arterial studies done. He has no issues or complaints today. He denies signs of infection. He has his juxta light compressions with him today. 6/3; patient presents for 1 week follow-up. He has been using Hydrofera Blue under 3 layer compression. He denies signs and symptoms of infection. He did have bright green drainage which he states he has not seen before when the dressing was taken off today. He is using his juxta light compression to the left leg. 6/10; patient presents for 1 week follow-up. He has been tolerating Hydrofera Blue with 3 layer compression. He again reports bright green drainage. However he denies any signs of infection. He has been using juxta light compression to the left leg. 6/24; patient presents for 2-week follow-up. He has been tolerating Hydrofera Blue with 3 layer compression. Today he is healed. He brought his juxta lite compression for the right leg and continues to  wear the juxta light compression on the left leg READMISSION 05/05/2022 The patient returns to clinic today with a new wound on his left posterior leg. He has been wearing his juxta lite stockings on a regular basis, but on exam he still has 3+ pitting edema. I suspect he has lymphedema in addition to his known venous reflux. The wounds are scattered geographic wounds with light slough accumulation. They have been applying mupirocin and CeraVe ointment. He does have a VP shunt placement scheduled for next week, but it sounds like his neurosurgeon is not aware of the fact that he has an open wound. 05/11/2022: His VP shunt placement has been postponed while we get his wound to heal. The wounds are all smaller today with just a little eschar and minimal slough. Edema control is good. No concern for infection. 05/18/2022: All of the open areas have contracted and there is good perimeter epithelialization. There is a little bit of eschar and slough on the open areas. Good edema control. No concern for infection. 05/26/2022: Many of the small open areas have closed. There is Hydrofera Blue sponge stuck in a number of places and this is unable to be removed by the intake nurse. There is a fair amount of eschar and a little bit of slough present. 06/02/2022: Continued closure of small open skin sites. There are just a couple remaining and these have a little eschar on the surface. Edema control is excellent. 06/09/2022: We are down to just 1 small open area. There is some eschar and slough present. No concern for infection. Edema control is excellent. 06/16/2022: His wound is healed. READMISSION 08/11/2022 Mr. Short underwent a successful VP shunt placement. He returns to clinic today because of a suspicious lesion on his left posterior calf. He actually has no open wounds and his edema control is excellent. He is  wearing his juxta lite stockings religiously. On his left posterior calf, there is a raised scaly  lesion concerning for potential squamous cell carcinoma. 08/25/2022: The biopsy that I took at his last visit was consistent with a well-differentiated squamous cell carcinoma. He has an appointment at the skin surgery center on December 14. The wound from the biopsy has some slough accumulation, but no concern for infection. 11/06/2022: Since his last visit here, he has undergone Mohs surgery for the squamous cell carcinoma that I biopsied. He has a fairly substantial and deep defect. Apparently the procedure took place between 10 to 12 days ago and he has been in an [employer] ever since. When his Unna boot was removed, he was found to have crusting on his anterior tibial surface with small superficial wounds underneath. 11/16/2022: The anterior tibial surface wounds seem to largely be closed with just a couple of tiny open areas under some eschar. The Mohs defect is quite a bit cleaner, but still with nonviable subcutaneous tissue present along with slough and eschar, much of which is dried up Iodoflex. 11/24/2022: The anterior tibial wounds are closed. The Mohs defect is cleaner again this week with much less nonviable tissue. 12/11/2022: The Mohs defect continues to contract. There is good granulation tissue underneath a layer of dried up Iodoflex and some slough. ASWAD, MCMANUS ([id number]) [id number] Page 3 of 9 Electronic Signature(s) Signed: 12/11/2022 11:57:36 AM By: [name] Maudlin MD FACS Entered By: [name] on 12/11/2022 11:57:36 -------------------------------------------------------------------------------- Physical Exam Details Patient Name: Date of Service: [name], [name] 12/11/2022 11:15 A M Medical Record Number: [id number] Patient Account Number: [id number] Date of Birth/Sex: Treating RN: 1936-07-05 (87 y.o. M) Primary Care Provider: Kathlene [name] Other Clinician: Referring Provider: Treating Provider/Extender: [name]  in Treatment: 17 Constitutional Slightly hypertensive. . . . no acute distress. Respiratory Normal work of breathing on room air. Notes 12/11/2022: The Mohs defect continues to contract. There is good granulation tissue underneath a layer of dried up Iodoflex and some slough. Electronic Signature(s) Signed: 12/11/2022 11:58:05 AM By: [name] Maudlin MD FACS Entered By: [name] on 12/11/2022 11:58:05 -------------------------------------------------------------------------------- Physician Orders Details Patient Name: Date of Service: [name], [name] 12/11/2022 11:15 A M Medical Record Number: [id number] Patient Account Number: [id number] Date of Birth/Sex: Treating RN: November 03, 1935 (87 y.o. [name] Primary Care Provider: Kathlene [name] Other Clinician: Referring Provider: Treating Provider/Extender: [name] in Treatment: 17 Verbal / Phone Orders: No Diagnosis Coding ICD-10 Coding Code Description [phone] Non-pressure chronic ulcer of left calf with fat layer exposed L98.9 Disorder of the skin and subcutaneous tissue, unspecified I87.2 Venous insufficiency (chronic) (peripheral) I10 Essential (primary) hypertension Follow-up Appointments ppointment in 1 week. - Dr. Celine [name] - room 2 Return A Anesthetic (In clinic) Topical Lidocaine 4% applied to wound bed Bathing/ Shower/ Hygiene May shower with protection but do not get wound dressing(s) wet. Protect dressing(s) with water repellant cover (for example, large plastic bag) or a cast cover and may then take shower. Edema Control - Lymphedema / SCD / Other Elevate legs to the level of the heart or above for 30 minutes daily and/or when sitting for 3-4 times a day throughout the day. Avoid standing for long periods of time. Exercise regularly - including ankle circles while sitting Moisturize legs daily. - both legs nightly after removing juxtalites Compression stocking or Garment 20-30 mm/Hg  pressure to: - both legs daily Wound Treatment Wound #3 - Lower  Leg Wound Laterality: Left, Posterior Cleanser: Soap and Water 1 x Per Day/30 Days Discharge Instructions: May shower and wash wound with dial antibacterial soap and water prior to dressing change. SHAYON, RAMM ([id number]) [id number] Page 4 of 9 Cleanser: Wound Cleanser 1 x Per Day/30 Days Discharge Instructions: Cleanse the wound with wound cleanser prior to applying a clean dressing using gauze sponges, not tissue or cotton balls. Peri-Wound Care: Sween Lotion (Moisturizing lotion) 1 x Per Day/30 Days Discharge Instructions: Apply moisturizing lotion as directed Prim Dressing: Promogran Prisma Matrix, 4.34 (sq in) (silver collagen) 1 x Per Day/30 Days ary Discharge Instructions: Moisten collagen with saline or hydrogel Secondary Dressing: ABD Pad, 5x9 1 x Per Day/30 Days Discharge Instructions: Apply over primary dressing as directed. Secondary Dressing: Woven Gauze Sponge, Non-Sterile 4x4 in 1 x Per Day/30 Days Discharge Instructions: Apply over primary dressing as directed. Compression Wrap: ThreePress (3 layer compression wrap) 1 x Per Day/30 Days Discharge Instructions: Apply three layer compression as directed. Electronic Signature(s) Signed: 12/11/2022 1:35:04 PM By: [name] Maudlin MD FACS Entered By: [name] on 12/11/2022 11:58:19 -------------------------------------------------------------------------------- Problem List Details Patient Name: Date of Service: [name], [name] 12/11/2022 11:15 A M Medical Record Number: [id number] Patient Account Number: [id number] Date of Birth/Sex: Treating RN: Sep 08, 1936 (87 y.o. M) Primary Care Provider: Kathlene [name] Other Clinician: Referring Provider: Treating Provider/Extender: [name] in Treatment: 17 Active Problems ICD-10 Encounter Code Description Active Date MDM Diagnosis L97.222 Non-pressure  chronic ulcer of left calf with fat layer exposed 11/06/2022 No Yes L98.9 Disorder of the skin and subcutaneous tissue, unspecified 08/11/2022 No Yes I87.2 Venous insufficiency (chronic) (peripheral) 08/11/2022 No Yes I10 Essential (primary) hypertension 08/11/2022 No Yes Inactive Problems Resolved Problems ICD-10 Code Description Active Date Resolved Date L97.821 Non-pressure chronic ulcer of other part of left lower leg limited to breakdown of skin 11/06/2022 11/06/2022 Electronic Signature(s) Signed: 12/11/2022 11:56:38 AM By: [name] Maudlin MD FACS Entered By: [name] on 12/11/2022 11:56:38 [name], [name] ([id number]) [id number] Page 5 of 9 -------------------------------------------------------------------------------- Progress Note Details Patient Name: Date of Service: [name], [name] 12/11/2022 11:15 A M Medical Record Number: [id number] Patient Account Number: [id number] Date of Birth/Sex: Treating RN: [birth year] (87 y.o. M) Primary Care Provider: Kathlene [name] Other Clinician: Referring Provider: Treating Provider/Extender: [name] in Treatment: 17 Subjective Chief Complaint Information obtained from Patient Left lower extremity wound History of Present Illness (HPI) [name] is an 87 year old male with a past medical history of CAD s/p DES to LCx 2006, carotid artery disease, hypertension, hyperlipidemia, paroxysmal atrial fibrillation on Eliquis and Cardizem that presents today for right lower extremity wound. He was seen by [name], his primary care provider on 4/29 for this issue and was referred to our clinic. Patient states that he had a small wound that spontaneously started in October 2021 and has not healed. He states this has progressively gotten larger. He has been using acne cream prescribed by the dermatologist for this issue. He reports drainage to the wound but no purulent drainage. He reports mild  soreness to the wound. He has swelling in his right leg greater than left and reports this is a chronic issue for the past 1 to 2 years. He denies resting leg pain or pain with ambulation. Of note He was prescribed doxycycline at the end of last month and has finished this course for possible skin infection to the wound site. 02/06/2021; I am seeing this patient  who was admitted to the clinic last week by Dr. Heber [place]. He has predominantly I think a venous insufficiency wound on the right medial lower leg he has been using Santyl Hydrofera Blue under 3 layer compression. Arterial studies were ordered last week but do not seem to been put through. He is not a diabetic. He did have venous reflux studies done in August 2020. He did have abnormal reflux time was noted in the great saphenous vein in the distal thigh and the great saphenous vein at the knee. There was no evidence of DVT or SVT at the time he was not felt to have large enough saphenous veins on either side for intervention. Compression stockings at least knee-high were recommended. Follow-up was on a as needed basis with [name] The patient does not describe claudication but his pulses in his feet are not vibrant this could be because of the swelling 5/26; patient presents for 1 week follow-up. He has been using Hydrofera Blue under 3 layer compression. He had arterial studies done. He has no issues or complaints today. He denies signs of infection. He has his juxta light compressions with him today. 6/3; patient presents for 1 week follow-up. He has been using Hydrofera Blue under 3 layer compression. He denies signs and symptoms of infection. He did have bright green drainage which he states he has not seen before when the dressing was taken off today. He is using his juxta light compression to the left leg. 6/10; patient presents for 1 week follow-up. He has been tolerating Hydrofera Blue with 3 layer compression. He again reports bright  green drainage. However he denies any signs of infection. He has been using juxta light compression to the left leg. 6/24; patient presents for 2-week follow-up. He has been tolerating Hydrofera Blue with 3 layer compression. Today he is healed. He brought his juxta lite compression for the right leg and continues to wear the juxta light compression on the left leg READMISSION 05/05/2022 The patient returns to clinic today with a new wound on his left posterior leg. He has been wearing his juxta lite stockings on a regular basis, but on exam he still has 3+ pitting edema. I suspect he has lymphedema in addition to his known venous reflux. The wounds are scattered geographic wounds with light slough accumulation. They have been applying mupirocin and CeraVe ointment. He does have a VP shunt placement scheduled for next week, but it sounds like his neurosurgeon is not aware of the fact that he has an open wound. 05/11/2022: His VP shunt placement has been postponed while we get his wound to heal. The wounds are all smaller today with just a little eschar and minimal slough. Edema control is good. No concern for infection. 05/18/2022: All of the open areas have contracted and there is good perimeter epithelialization. There is a little bit of eschar and slough on the open areas. Good edema control. No concern for infection. 05/26/2022: Many of the small open areas have closed. There is Hydrofera Blue sponge stuck in a number of places and this is unable to be removed by the intake nurse. There is a fair amount of eschar and a little bit of slough present. 06/02/2022: Continued closure of small open skin sites. There are just a couple remaining and these have a little eschar on the surface. Edema control is excellent. 06/09/2022: We are down to just 1 small open area. There is some eschar and slough present. No concern for infection.  Edema control is excellent. 06/16/2022: His wound is  healed. READMISSION 08/11/2022 Mr. Short underwent a successful VP shunt placement. He returns to clinic today because of a suspicious lesion on his left posterior calf. He actually has no open wounds and his edema control is excellent. He is wearing his juxta lite stockings religiously. On his left posterior calf, there is a raised scaly lesion concerning for potential squamous cell carcinoma. 08/25/2022: The biopsy that I took at his last visit was consistent with a well-differentiated squamous cell carcinoma. He has an appointment at the skin surgery center on December 14. The wound from the biopsy has some slough accumulation, but no concern for infection. 11/06/2022: Since his last visit here, he has undergone Mohs surgery for the squamous cell carcinoma that I biopsied. He has a fairly substantial and deep defect. Apparently the procedure took place between 10 to 12 days ago and he has been in an [employer] ever since. When his Unna boot was removed, he was found to have crusting on his anterior tibial surface with small superficial wounds underneath. 11/16/2022: The anterior tibial surface wounds seem to largely be closed with just a couple of tiny open areas under some eschar. The Mohs defect is quite a bit cleaner, but still with nonviable subcutaneous tissue present along with slough and eschar, much of which is dried up Iodoflex. [name], [name] ([id number]) [id number] Page 6 of 9 11/24/2022: The anterior tibial wounds are closed. The Mohs defect is cleaner again this week with much less nonviable tissue. 12/11/2022: The Mohs defect continues to contract. There is good granulation tissue underneath a layer of dried up Iodoflex and some slough. Patient History Information obtained from Patient. Family History Cancer - Father, Diabetes - Maternal Grandparents, Heart Disease - Maternal Grandparents, Hypertension - Maternal Grandparents, No family history of Hereditary  Spherocytosis, Kidney Disease, Lung Disease, Seizures, Stroke, Thyroid Problems, Tuberculosis. Social History Former smoker - Quit over 30 years ago, Marital Status - Married, Alcohol Use - Rarely, Drug Use - No History, Caffeine Use - Daily. Medical History Eyes Patient has history of Glaucoma Cardiovascular Patient has history of Arrhythmia - afib, Coronary Artery Disease, Hypertension, Peripheral Venous Disease Endocrine Denies history of Type I Diabetes, Type II Diabetes Genitourinary Denies history of End Stage Renal Disease Integumentary (Skin) Denies history of History of Burn Musculoskeletal Patient has history of Osteoarthritis Oncologic Denies history of Received Chemotherapy, Received Radiation Psychiatric Denies history of Anorexia/bulimia, Confinement Anxiety Hospitalization/Surgery History - lumbar laminectomy. - total knee arthropathy right. - bil total hip arthroplasty. - coronary stents. - toe surgery. - tonsillectomy. - shunt insertion. Medical A Surgical History Notes nd Gastrointestinal colon polyps Endocrine Hypothyroidism Genitourinary BPH Musculoskeletal Bilat Hip Replacements, Right Knee Replacement, lumbar stenosis Neurologic hydrocephalus Oncologic Skin Cancer multiple sites Objective Constitutional Slightly hypertensive. no acute distress. Vitals Time Taken: 11:15 AM, Pulse: 71 bpm, Respiratory Rate: 16 breaths/min, Blood Pressure: 142/79 mmHg. Respiratory Normal work of breathing on room air. General Notes: 12/11/2022: The Mohs defect continues to contract. There is good granulation tissue underneath a layer of dried up Iodoflex and some slough. Integumentary (Hair, Skin) Wound #3 status is Open. Original cause of wound was Puncture. The date acquired was: 08/12/2022. The wound has been in treatment 15 weeks. The wound is located on the Left,Posterior Lower Leg. The wound measures 1.6cm length x 1.4cm width x 0.2cm depth; 1.759cm^2 area and  0.352cm^3 volume. There is Fat Layer (Subcutaneous Tissue) exposed. There is no tunneling or undermining noted.  There is a medium amount of serosanguineous drainage noted. The wound margin is distinct with the outline attached to the wound base. There is medium (34-66%) red granulation within the wound bed. There is a medium (34-66%) amount of necrotic tissue within the wound bed including Eschar and Adherent Slough. The periwound skin appearance had no abnormalities noted for texture. The periwound skin appearance had no abnormalities noted for moisture. The periwound skin appearance exhibited: Hemosiderin Staining. Periwound temperature was noted as No Abnormality. Assessment [name], [name] ([id number]) [id number] Page 7 of 9 Active Problems ICD-10 Non-pressure chronic ulcer of left calf with fat layer exposed Disorder of the skin and subcutaneous tissue, unspecified Venous insufficiency (chronic) (peripheral) Essential (primary) hypertension Procedures Wound #3 Pre-procedure diagnosis of Wound #3 is a Lesion located on the Left,Posterior Lower Leg . There was a Excisional Skin/Subcutaneous Tissue Debridement with a total area of 2.24 sq cm performed by [name] Maudlin, MD. With the following instrument(s): Curette to remove Viable tissue/material. Material removed includes Subcutaneous Tissue and Slough and. No specimens were taken. A time out was conducted at 11:25, prior to the start of the procedure. A Minimum amount of bleeding was controlled with Pressure. The procedure was tolerated well. Post Debridement Measurements: 1.6cm length x 1.4cm width x 0.1cm depth; 0.176cm^3 volume. Character of Wound/Ulcer Post Debridement requires further debridement. Post procedure Diagnosis Wound #3: Same as Pre-Procedure General Notes: Scribed for Dr. Celine [name] by [name] East, RN. Pre-procedure diagnosis of Wound #3 is a Lesion located on the Left,Posterior Lower Leg .  There was a Three Layer Compression Therapy Procedure by [name] East, RN. Post procedure Diagnosis Wound #3: Same as Pre-Procedure Notes: urgo lite. Plan Follow-up Appointments: Return Appointment in 1 week. - Dr. Celine [name] - room 2 Anesthetic: (In clinic) Topical Lidocaine 4% applied to wound bed Bathing/ Shower/ Hygiene: May shower with protection but do not get wound dressing(s) wet. Protect dressing(s) with water repellant cover (for example, large plastic bag) or a cast cover and may then take shower. Edema Control - Lymphedema / SCD / Other: Elevate legs to the level of the heart or above for 30 minutes daily and/or when sitting for 3-4 times a day throughout the day. Avoid standing for long periods of time. Exercise regularly - including ankle circles while sitting Moisturize legs daily. - both legs nightly after removing juxtalites Compression stocking or Garment 20-30 mm/Hg pressure to: - both legs daily WOUND #3: - Lower Leg Wound Laterality: Left, Posterior Cleanser: Soap and Water 1 x Per Day/30 Days Discharge Instructions: May shower and wash wound with dial antibacterial soap and water prior to dressing change. Cleanser: Wound Cleanser 1 x Per Day/30 Days Discharge Instructions: Cleanse the wound with wound cleanser prior to applying a clean dressing using gauze sponges, not tissue or cotton balls. Peri-Wound Care: Sween Lotion (Moisturizing lotion) 1 x Per Day/30 Days Discharge Instructions: Apply moisturizing lotion as directed Prim Dressing: Promogran Prisma Matrix, 4.34 (sq in) (silver collagen) 1 x Per Day/30 Days ary Discharge Instructions: Moisten collagen with saline or hydrogel Secondary Dressing: ABD Pad, 5x9 1 x Per Day/30 Days Discharge Instructions: Apply over primary dressing as directed. Secondary Dressing: Woven Gauze Sponge, Non-Sterile 4x4 in 1 x Per Day/30 Days Discharge Instructions: Apply over primary dressing as directed. Com pression Wrap:  ThreePress (3 layer compression wrap) 1 x Per Day/30 Days Discharge Instructions: Apply three layer compression as directed. 12/11/2022: The Mohs defect continues to contract. There is good granulation tissue underneath a layer of dried  up Iodoflex and some slough. I used a curette to debride slough, eschar, and subcutaneous tissue from the wound. At this point, the wound is clean enough and is healing nicely but I think we can switch to Prisma silver collagen as our contact layer. Continue 3 layer compression. Follow-up in 1 week. Electronic Signature(s) Signed: 12/11/2022 12:05:21 PM By: [name] Maudlin MD FACS Entered By: [name] on 12/11/2022 12:05:21 -------------------------------------------------------------------------------- HxROS Details Patient Name: Date of Service: [name], [name] 12/11/2022 11:15 A M Medical Record Number: [id number] Patient Account Number: [id number] [name], [name] ([id number]) [phone].pdf Page 8 of 9 Date of Birth/Sex: Treating RN: 1936-08-13 (87 y.o. M) Primary Care Provider: Other Clinician: Kathlene [name] Referring Provider: Treating Provider/Extender: [name] in Treatment: 17 Information Obtained From Patient Eyes Medical History: Positive for: Glaucoma Cardiovascular Medical History: Positive for: Arrhythmia - afib; Coronary Artery Disease; Hypertension; Peripheral Venous Disease Gastrointestinal Medical History: Past Medical History Notes: colon polyps Endocrine Medical History: Negative for: Type I Diabetes; Type II Diabetes Past Medical History Notes: Hypothyroidism Genitourinary Medical History: Negative for: End Stage Renal Disease Past Medical History Notes: BPH Integumentary (Skin) Medical History: Negative for: History of Burn Musculoskeletal Medical History: Positive for: Osteoarthritis Past Medical History Notes: Bilat Hip Replacements, Right Knee Replacement,  lumbar stenosis Neurologic Medical History: Past Medical History Notes: hydrocephalus Oncologic Medical History: Negative for: Received Chemotherapy; Received Radiation Past Medical History Notes: Skin Cancer multiple sites Psychiatric Medical History: Negative for: Anorexia/bulimia; Confinement Anxiety HBO Extended History Items Eyes: Glaucoma Immunizations Pneumococcal Vaccine: Received Pneumococcal Vaccination: Yes Received Pneumococcal Vaccination On or After [address]FAY, STEFKO ([id number]) [phone].pdf Page 9 of 9 Implantable Devices None Hospitalization / Surgery History Type of Hospitalization/Surgery lumbar laminectomy total knee arthropathy right bil total hip arthroplasty coronary stents toe surgery tonsillectomy shunt insertion Family and Social History Cancer: Yes - Father; Diabetes: Yes - Maternal Grandparents; Heart Disease: Yes - Maternal Grandparents; Hereditary Spherocytosis: No; Hypertension: Yes - Maternal Grandparents; Kidney Disease: No; Lung Disease: No; Seizures: No; Stroke: No; Thyroid Problems: No; Tuberculosis: No; Former smoker - Quit over 30 years ago; Marital Status - Married; Alcohol Use: Rarely; Drug Use: No History; Caffeine Use: Daily; Financial Concerns: No; Food, Clothing or Shelter Needs: No; Support System Lacking: No; Transportation Concerns: No Engineer, maintenance) Signed: 12/11/2022 1:35:04 PM By: [name] Maudlin MD FACS Entered By: [name] on 12/11/2022 11:57:43 -------------------------------------------------------------------------------- SuperBill Details Patient Name: Date of Service: [name], [name] 12/11/2022 Medical Record Number: [id number] Patient Account Number: [id number] Date of Birth/Sex: Treating RN: 22-Jan-1936 (87 y.o. M) Primary Care Provider: Kathlene [name] Other Clinician: Referring Provider: Treating Provider/Extender: [name] in  Treatment: 17 Diagnosis Coding ICD-10 Codes Code Description [phone] Non-pressure chronic ulcer of left calf with fat layer exposed L98.9 Disorder of the skin and subcutaneous tissue, unspecified I87.2 Venous insufficiency (chronic) (peripheral) I10 Essential (primary) hypertension Facility Procedures : CPT4 Code: [id number] Description: [id number] - DEB SUBQ TISSUE 20 SQ CM/< ICD-10 Diagnosis Description L97.222 Non-pressure chronic ulcer of left calf with fat layer exposed Modifier: Quantity: 1 Physician Procedures : CPT4 Code Description Modifier [id number] - WC PHYS LEVEL 3 - EST PT 25 ICD-10 Diagnosis Description L97.222 Non-pressure chronic ulcer of left calf with fat layer exposed L98.9 Disorder of the skin and subcutaneous tissue, unspecified I87.2 Venous  insufficiency (chronic) (peripheral) I10 Essential (primary) hypertension Quantity: 1 : [id number] - WC PHYS SUBQ TISS 20 SQ CM ICD-10 Diagnosis Description L97.222  Non-pressure chronic ulcer of left calf with fat layer exposed Quantity: 1 Electronic Signature(s) Signed: 12/11/2022 12:05:37 PM By: [name] Maudlin MD FACS Entered By: [name] on 12/11/2022 12:05:37

## 2022-12-15 ENCOUNTER — Ambulatory Visit: Payer: Medicare Other | Attending: Cardiovascular Disease | Admitting: Cardiovascular Disease

## 2022-12-15 VITALS — BP 124/66 | HR 78 | Ht 69.0 in | Wt 198.0 lb

## 2022-12-15 DIAGNOSIS — E782 Mixed hyperlipidemia: Secondary | ICD-10-CM

## 2022-12-15 DIAGNOSIS — I10 Essential (primary) hypertension: Secondary | ICD-10-CM

## 2022-12-15 DIAGNOSIS — I482 Chronic atrial fibrillation, unspecified: Secondary | ICD-10-CM | POA: Diagnosis not present

## 2022-12-15 DIAGNOSIS — R251 Tremor, unspecified: Secondary | ICD-10-CM

## 2022-12-15 NOTE — Patient Instructions (Addendum)
Medication Instructions:  Your physician recommends that you continue on your current medications as directed. Please refer to the Current Medication list given to you today.  *If you need a refill on your cardiac medications before your next appointment, please call your pharmacy*  Lab Work: If you have labs (blood work) drawn today and your tests are completely normal, you will receive your results only by: MyChart Message (if you have MyChart) OR A paper copy in the mail If you have any lab test that is abnormal or we need to change your treatment, we will call you to review the results.  Testing/Procedures: None ordered today.  Follow-Up: At [facility], you and your health needs are our priority.  As part of our continuing mission to provide you with exceptional heart care, we have created designated Provider Care Teams.  These Care Teams include your primary Cardiologist (physician) and Advanced Practice Providers (APPs -  Physician Assistants and Nurse Practitioners) who all work together to provide you with the care you need, when you need it.  We recommend signing up for the patient portal called "MyChart".  Sign up information is provided on this After Visit Summary.  MyChart is used to connect with patients for Virtual Visits (Telemedicine).  Patients are able to view lab/test results, encounter notes, upcoming appointments, etc.  Non-urgent messages can be sent to your provider as well.   To learn more about what you can do with MyChart, go to [email or web address].    Your next appointment:   12 month(s)  Provider:   Peter Nishan, MD     

## 2022-12-21 ENCOUNTER — Encounter (HOSPITAL_BASED_OUTPATIENT_CLINIC_OR_DEPARTMENT_OTHER): Payer: Medicare Other | Attending: General Surgery | Admitting: General Surgery

## 2022-12-21 DIAGNOSIS — I872 Venous insufficiency (chronic) (peripheral): Secondary | ICD-10-CM | POA: Insufficient documentation

## 2022-12-21 DIAGNOSIS — I480 Paroxysmal atrial fibrillation: Secondary | ICD-10-CM | POA: Insufficient documentation

## 2022-12-21 DIAGNOSIS — Z955 Presence of coronary angioplasty implant and graft: Secondary | ICD-10-CM | POA: Insufficient documentation

## 2022-12-21 DIAGNOSIS — Z7901 Long term (current) use of anticoagulants: Secondary | ICD-10-CM | POA: Diagnosis not present

## 2022-12-21 DIAGNOSIS — I2510 Atherosclerotic heart disease of native coronary artery without angina pectoris: Secondary | ICD-10-CM | POA: Diagnosis not present

## 2022-12-21 DIAGNOSIS — I10 Essential (primary) hypertension: Secondary | ICD-10-CM | POA: Diagnosis not present

## 2022-12-21 DIAGNOSIS — E785 Hyperlipidemia, unspecified: Secondary | ICD-10-CM | POA: Diagnosis not present

## 2022-12-21 DIAGNOSIS — Z79899 Other long term (current) drug therapy: Secondary | ICD-10-CM | POA: Diagnosis not present

## 2022-12-21 DIAGNOSIS — Z982 Presence of cerebrospinal fluid drainage device: Secondary | ICD-10-CM | POA: Diagnosis not present

## 2022-12-21 DIAGNOSIS — L97222 Non-pressure chronic ulcer of left calf with fat layer exposed: Secondary | ICD-10-CM | POA: Diagnosis not present

## 2022-12-21 DIAGNOSIS — L97812 Non-pressure chronic ulcer of other part of right lower leg with fat layer exposed: Secondary | ICD-10-CM | POA: Insufficient documentation

## 2022-12-21 DIAGNOSIS — L988 Other specified disorders of the skin and subcutaneous tissue: Secondary | ICD-10-CM | POA: Diagnosis not present

## 2022-12-22 ENCOUNTER — Other Ambulatory Visit: Payer: Self-pay | Admitting: Internal Medicine

## 2022-12-22 NOTE — Progress Notes (Signed)
[name], [name] ([id number]) [id number] Page 1 of 7 Visit Report for 12/21/2022 Arrival Information Details Patient Name: Date of Service: [name], [name] 12/21/2022 10:45 A M Medical Record Number: [id number] Patient Account Number: [id number] Date of Birth/Sex: Treating RN: 13-Jan-1936 (87 y.o. [name] Primary Care Govani Radloff: [name] Other Clinician: Referring Aidyn Sportsman: Treating Nils Thor/Extender: [name] in Treatment: 18 Visit Information History Since Last Visit Added or deleted any medications: No Patient Arrived: [name] Any new allergies or adverse reactions: No Arrival Time: 10:45 Had a fall or experienced change in No Accompanied By: wife activities of daily living that may affect Transfer Assistance: None risk of falls: Patient Identification Verified: Yes Signs or symptoms of abuse/neglect since last visito No Secondary Verification Process Completed: Yes Hospitalized since last visit: No Patient Requires Transmission-Based Precautions: No Implantable device outside of the clinic excluding No Patient Has Alerts: No cellular tissue based products placed in the center since last visit: Has Dressing in Place as Prescribed: Yes Has Compression in Place as Prescribed: Yes Pain Present Now: No Electronic Signature(s) Signed: 12/21/2022 4:49:56 PM By: [name] Entered By: [name] on 12/21/2022 10:45:35 -------------------------------------------------------------------------------- Compression Therapy Details Patient Name: Date of Service: [name], [name] 12/21/2022 10:45 A M Medical Record Number: [id number] Patient Account Number: [id number] Date of Birth/Sex: Treating RN: August 12, 1936 (87 y.o. [name] Primary Care Hamdi Kley: [name] Other Clinician: Referring Chanel Mckesson: Treating [name]/Extender: [name] in Treatment: 18 Compression Therapy  Performed for Wound Assessment: Wound #3 Left,Posterior Lower Leg Performed By: Clinician [name], RN Compression Type: Double Layer Post Procedure Diagnosis Same as Pre-procedure Electronic Signature(s) Signed: 12/21/2022 4:49:56 PM By: [name] By: [name] on 12/21/2022 11:03:02 -------------------------------------------------------------------------------- Encounter Discharge Information Details Patient Name: Date of Service: [name], [name] 12/21/2022 10:45 A M Medical Record Number: [id number] Patient Account Number: [id number] Date of Birth/Sex: Treating RN: 04/07/1936 (87 y.o. [name] Primary Care [name]: [name] Other Clinician: Referring [name]: Treating Murrell Dome/Extender: [name] in Treatment: 18 Encounter Discharge Information Items Post Procedure Vitals Discharge Condition: Stable Temperature (F): 98.1 Ambulatory Status: Cane Pulse (bpm): 74 Discharge Destination: Home Respiratory Rate (breaths/min): 16 Transportation: Private Auto Blood Pressure (mmHg): 151/87 MAHYAR, VINK ([id number]) [phone].pdf Page 2 of 7 Accompanied By: wife Schedule Follow-up Appointment: Yes Clinical Summary of Care: Patient Declined Electronic Signature(s) Signed: 12/21/2022 4:49:56 PM By: [name] By: [name] on 12/21/2022 11:14:12 -------------------------------------------------------------------------------- Lower Extremity Assessment Details Patient Name: Date of Service: [name], [name] 12/21/2022 10:45 A M Medical Record Number: [id number] Patient Account Number: [id number] Date of Birth/Sex: Treating RN: [birth year] (87 y.o. [name] Primary Care Illana Nolting: [name] Other Clinician: Referring [name]: Treating Laneshia Pina/Extender: [name] in Treatment: 18 Edema Assessment Assessed: [Left: No] [Right:  No] [Left: Edema] [Right: :] Calf Left: Right: Point of Measurement: From Medial Instep 35 cm Ankle Left: Right: Point of Measurement: From Medial Instep 22 cm Vascular Assessment Pulses: Dorsalis Pedis Palpable: [Left:Yes] Electronic Signature(s) Signed: 12/21/2022 4:49:56 PM By: [name] Entered By: [name] on 12/21/2022 10:54:31 -------------------------------------------------------------------------------- Multi Wound Chart Details Patient Name: Date of Service: [name], [name] 12/21/2022 10:45 A M Medical Record Number: [id number] Patient Account Number: [id number] Date of Birth/Sex: Treating RN: 29-Jan-1936 (87 y.o. M) Primary Care [name]: [name] Other Clinician: Referring [name]: Treating [name]/Extender: [name] in Treatment: [phone] Vital  Signs Height(in): Pulse(bpm): 74 Weight(lbs): Blood Pressure(mmHg): 151/87 Body Mass Index(BMI): Temperature(F): 98.1 Respiratory Rate(breaths/min): 16 [3:Photos:] [N/A:N/A] Left, Posterior Lower Leg N/A N/A Wound Location: Puncture N/A N/A Wounding Event: Lesion N/A N/A Primary Etiology: Glaucoma, Arrhythmia, Coronary N/A N/A Comorbid History: Artery Disease, Hypertension, Peripheral Venous Disease, Osteoarthritis 08/12/2022 N/A N/A Date Acquired: 16 N/A N/A Weeks of Treatment: Open N/A N/A Wound Status: No N/A N/A Wound Recurrence: 1.8x0.8x0.1 N/A N/A Measurements L x W x D (cm) 1.131 N/A N/A A (cm) : rea 0.113 N/A N/A Volume (cm) : -1493.00% N/A N/A % Reduction in A rea: -707.10% N/A N/A % Reduction in Volume: Full Thickness Without Exposed N/A N/A Classification: Support Structures Medium N/A N/A Exudate A mount: Serosanguineous N/A N/A Exudate Type: red, brown N/A N/A Exudate Color: Distinct, outline attached N/A N/A Wound Margin: Large (67-100%) N/A N/A Granulation A mount: Red N/A N/A Granulation Quality: Small (1-33%) N/A N/A Necrotic  A mount: Fat Layer (Subcutaneous Tissue): Yes N/A N/A Exposed Structures: Fascia: No Tendon: No Muscle: No Joint: No Bone: No Medium (34-66%) N/A N/A Epithelialization: Debridement - Selective/Open Wound N/A N/A Debridement: Pre-procedure Verification/Time Out 11:01 N/A N/A Taken: Lidocaine 4% Topical Solution N/A N/A Pain Control: Slough N/A N/A Tissue Debrided: Non-Viable Tissue N/A N/A Level: 1.44 N/A N/A Debridement A (sq cm): rea Curette N/A N/A Instrument: Minimum N/A N/A Bleeding: Pressure N/A N/A Hemostasis A chieved: Procedure was tolerated well N/A N/A Debridement Treatment Response: 1.8x0.8x0.1 N/A N/A Post Debridement Measurements L x W x D (cm) 0.113 N/A N/A Post Debridement Volume: (cm) No Abnormalities Noted N/A N/A Periwound Skin Texture: No Abnormalities Noted N/A N/A Periwound Skin Moisture: Hemosiderin Staining: Yes N/A N/A Periwound Skin Color: No Abnormality N/A N/A Temperature: Compression Therapy N/A N/A Procedures Performed: Debridement Treatment Notes Electronic Signature(s) Signed: 12/21/2022 11:05:08 AM By: Fredirick Maudlin MD FACS Entered By: Fredirick Maudlin on 12/21/2022 11:05:08 -------------------------------------------------------------------------------- Multi-Disciplinary Care Plan Details Patient Name: Date of Service: [name], [name] 12/21/2022 10:45 A M Medical Record Number: [id number] Patient Account Number: [id number] Date of Birth/Sex: Treating RN: 1935-12-26 (87 y.o. [name] Primary Care Donnel Venuto: [name] Other Clinician: Referring Raylen Ken: Treating Tre Sanker/Extender: [name] in Treatment: 18 Active Inactive Necrotic Tissue Nursing Diagnoses: MADAN, HANNEL ([id number]) [id number].pdf Page 4 of 7 Impaired tissue integrity related to necrotic/devitalized tissue Knowledge deficit related to management of necrotic/devitalized  tissue Goals: Necrotic/devitalized tissue will be minimized in the wound bed Date Initiated: 08/25/2022 Target Resolution Date: 01/22/2023 Goal Status: Active Patient/caregiver will verbalize understanding of reason and process for debridement of necrotic tissue Date Initiated: 08/25/2022 Target Resolution Date: 01/22/2023 Goal Status: Active Interventions: Assess patient pain level pre-, during and post procedure and prior to discharge Provide education on necrotic tissue and debridement process Treatment Activities: Apply topical anesthetic as ordered : 08/25/2022 Notes: Electronic Signature(s) Signed: 12/21/2022 4:49:56 PM By: [name] Entered By: [name] on 12/21/2022 11:13:26 -------------------------------------------------------------------------------- Pain Assessment Details Patient Name: Date of Service: [name], [name] 12/21/2022 10:45 A M Medical Record Number: [id number] Patient Account Number: [id number] Date of Birth/Sex: Treating RN: [birth year] (87 y.o. [name] Primary Care Yanelli Zapanta: [name] Other Clinician: Referring Jamia Hoban: Treating Taron Conrey/Extender: [name] in Treatment: 18 Active Problems Location of Pain Severity and Description of Pain Patient Has Paino No Site Locations Rate the pain. Current Pain Level: 0 Pain Management and Medication Current Pain Management: Electronic Signature(s) Signed: 12/21/2022 4:49:56 PM By: [name] Entered By:  [name] on 12/21/2022 10:54:21 -------------------------------------------------------------------------------- Patient/Caregiver Education Details Patient Name: Date of Service: [name], [place] 4/1/2024andnbsp10:45 Ulysses Record Number: [id number] Patient Account Number: [id number] DONTAVIAN, AUSLANDER ([id number]) [phone].pdf Page 5 of 7 Date of Birth/Gender: Treating RN: 05/06/1936 (87 y.o. [name] Primary Care Physician: [name] Other Clinician: Referring Physician: Treating Physician/Extender: [name] in Treatment: 18 Education Assessment Education Provided To: Patient Education Topics Provided Wound/Skin Impairment: Methods: Explain/Verbal Responses: Reinforcements needed, State content correctly Electronic Signature(s) Signed: 12/21/2022 4:49:56 PM By: [name] Entered By: [name] on 12/21/2022 11:13:37 -------------------------------------------------------------------------------- Wound Assessment Details Patient Name: Date of Service: [name], [name] 12/21/2022 10:45 A M Medical Record Number: [id number] Patient Account Number: [id number] Date of Birth/Sex: Treating RN: 01/04/1936 (87 y.o. [name] Primary Care Leann Mayweather: [name] Other Clinician: Referring Rosalie Gelpi: Treating Kahlee Metivier/Extender: [name] in Treatment: 18 Wound Status Wound Number: 3 Primary Lesion Etiology: Wound Location: Left, Posterior Lower Leg Wound Open Wounding Event: Puncture Status: Date Acquired: 08/12/2022 Comorbid Glaucoma, Arrhythmia, Coronary Artery Disease, Hypertension, Weeks Of Treatment: 16 History: Peripheral Venous Disease, Osteoarthritis Clustered Wound: No Photos Wound Measurements Length: (cm) 1.8 Width: (cm) 0.8 Depth: (cm) 0.1 Area: (cm) 1.131 Volume: (cm) 0.113 % Reduction in Area: -1493% % Reduction in Volume: -707.1% Epithelialization: Medium (34-66%) Tunneling: No Undermining: No Wound Description Classification: Full Thickness Without Exposed Suppor Wound Margin: Distinct, outline attached Exudate Amount: Medium Exudate Type: Serosanguineous Exudate Color: red, brown t Structures Foul Odor After Cleansing: No Slough/Fibrino Yes Wound Bed Granulation Amount: Large (67-100%) Exposed Structure Granulation Quality: Red Fascia Exposed: No AVRAHAM, STADTLER E  ([id number]) [phone].pdf Page 6 of 7 Necrotic Amount: Small (1-33%) Fat Layer (Subcutaneous Tissue) Exposed: Yes Necrotic Quality: Adherent Slough Tendon Exposed: No Muscle Exposed: No Joint Exposed: No Bone Exposed: No Periwound Skin Texture Texture Color No Abnormalities Noted: Yes No Abnormalities Noted: No Hemosiderin Staining: Yes Moisture No Abnormalities Noted: Yes Temperature / Pain Temperature: No Abnormality Treatment Notes Wound #3 (Lower Leg) Wound Laterality: Left, Posterior Cleanser Soap and Water Discharge Instruction: May shower and wash wound with dial antibacterial soap and water prior to dressing change. Wound Cleanser Discharge Instruction: Cleanse the wound with wound cleanser prior to applying a clean dressing using gauze sponges, not tissue or cotton balls. Peri-Wound Care Sween Lotion (Moisturizing lotion) Discharge Instruction: Apply moisturizing lotion as directed Topical Primary Dressing Promogran Prisma Matrix, 4.34 (sq in) (silver collagen) Discharge Instruction: Moisten collagen with saline or hydrogel Secondary Dressing ABD Pad, 5x9 Discharge Instruction: Apply over primary dressing as directed. Woven Gauze Sponge, Non-Sterile 4x4 in Discharge Instruction: Apply over primary dressing as directed. Secured With Compression Wrap ThreePress (3 layer compression wrap) Discharge Instruction: Apply three layer compression as directed. Compression Stockings Add-Ons Electronic Signature(s) Signed: 12/21/2022 4:49:56 PM By: [name] Entered By: [name] on 12/21/2022 10:56:58 -------------------------------------------------------------------------------- Vitals Details Patient Name: Date of Service: [name], [name] 12/21/2022 10:45 A M Medical Record Number: [id number] Patient Account Number: [id number] Date of Birth/Sex: Treating RN: [birth year] (87 y.o. [name] Primary Care Damyan Corne: [name] Other Clinician: Referring Jailynne Opperman: Treating Finneas Mathe/Extender: [name] in Treatment: 18 Vital Signs Time Taken: 10:45 Temperature (F): 98.1 Pulse (bpm): 74 Respiratory Rate (breaths/min): 16 Blood Pressure (mmHg): 151/87 Reference Range: 80 - 120 mg / dl BURNHAM, SEIN ([id number]) [id number] Page 7 of 7 Electronic Signature(s) Signed: 12/21/2022 4:49:56 PM By: [name] Entered By:  [name] on 12/21/2022 10:45:55

## 2022-12-22 NOTE — Progress Notes (Signed)
[name], [name] ([id number]) [id number] Page 1 of 10 Visit Report for 12/21/2022 Chief Complaint Document Details Patient Name: Date of Service: [name], [name] 12/21/2022 10:45 A M Medical Record Number: [id number] Patient Account Number: [id number] Date of Birth/Sex: Treating RN: 1935-11-22 (87 y.o. M) Primary Care Provider: [name] Other Clinician: Referring Provider: Treating Provider/Extender: [name] in Treatment: 18 Information Obtained from: Patient Chief Complaint Left lower extremity wound Electronic Signature(s) Signed: 12/21/2022 11:05:16 AM By: Fredirick Maudlin MD FACS Entered By: Fredirick Maudlin on 12/21/2022 11:05:16 -------------------------------------------------------------------------------- Debridement Details Patient Name: Date of Service: [name], [name] 12/21/2022 10:45 A M Medical Record Number: [id number] Patient Account Number: [id number] Date of Birth/Sex: Treating RN: Feb 12, 1936 (87 y.o. [name] Primary Care Provider: [name] Other Clinician: Referring Provider: Treating Provider/Extender: [name] in Treatment: 18 Debridement Performed for Assessment: Wound #3 Left,Posterior Lower Leg Performed By: Physician Fredirick Maudlin, MD Debridement Type: Debridement Level of Consciousness (Pre-procedure): Awake and Alert Pre-procedure Verification/Time Out Yes - 11:01 Taken: Start Time: 11:01 Pain Control: Lidocaine 4% T opical Solution T Area Debrided (L x W): otal 1.8 (cm) x 0.8 (cm) = 1.44 (cm) Tissue and other material debrided: Non-Viable, Slough, Slough Level: Non-Viable Tissue Debridement Description: Selective/Open Wound Instrument: Curette Bleeding: Minimum Hemostasis Achieved: Pressure Response to Treatment: Procedure was tolerated well Level of Consciousness (Post- Awake and Alert procedure): Post Debridement Measurements of Total Wound Length:  (cm) 1.8 Width: (cm) 0.8 Depth: (cm) 0.1 Volume: (cm) 0.113 Character of Wound/Ulcer Post Debridement: Improved Post Procedure Diagnosis Same as Pre-procedure Notes scribed for Dr. Celine Ahr by [name], RN Electronic Signature(s) Signed: 12/21/2022 11:50:00 AM By: Fredirick Maudlin MD FACS Signed: 12/21/2022 4:49:56 PM By: [name] Entered By: [name] on 12/21/2022 11:03:13 Daguao, Everton ([id number]) [id number] Page 2 of 10 -------------------------------------------------------------------------------- HPI Details Patient Name: Date of Service: [name], [name] 12/21/2022 10:45 A M Medical Record Number: [id number] Patient Account Number: [id number] Date of Birth/Sex: Treating RN: 1935/11/14 (87 y.o. M) Primary Care Provider: [name] Other Clinician: Referring Provider: Treating Provider/Extender: [name] in Treatment: 18 History of Present Illness HPI Description: [name] is an 87 year old male with a past medical history of CAD s/p DES to LCx 2006, carotid artery disease, hypertension, hyperlipidemia, paroxysmal atrial fibrillation on Eliquis and Cardizem that presents today for right lower extremity wound. He was seen by Dr. Larose Kells, his primary care provider on 4/29 for this issue and was referred to our clinic. Patient states that he had a small wound that spontaneously started in October 2021 and has not healed. He states this has progressively gotten larger. He has been using acne cream prescribed by the dermatologist for this issue. He reports drainage to the wound but no purulent drainage. He reports mild soreness to the wound. He has swelling in his right leg greater than left and reports this is a chronic issue for the past 1 to 2 years. He denies resting leg pain or pain with ambulation. Of note He was prescribed doxycycline at the end of last month and has finished this course for possible skin  infection to the wound site. 02/06/2021; I am seeing this patient who was admitted to the clinic last week by Dr. Heber Washburn. He has predominantly I think a venous insufficiency wound on the right medial lower leg he has been using Santyl Hydrofera Blue under 3 layer compression. Arterial studies were ordered last week but  do not seem to been put through. He is not a diabetic. He did have venous reflux studies done in August 2020. He did have abnormal reflux time was noted in the great saphenous vein in the distal thigh and the great saphenous vein at the knee. There was no evidence of DVT or SVT at the time he was not felt to have large enough saphenous veins on either side for intervention. Compression stockings at least knee-high were recommended. Follow-up was on a as needed basis with Dr. Donzetta Matters The patient does not describe claudication but his pulses in his feet are not vibrant this could be because of the swelling 5/26; patient presents for 1 week follow-up. He has been using Hydrofera Blue under 3 layer compression. He had arterial studies done. He has no issues or complaints today. He denies signs of infection. He has his juxta light compressions with him today. 6/3; patient presents for 1 week follow-up. He has been using Hydrofera Blue under 3 layer compression. He denies signs and symptoms of infection. He did have bright green drainage which he states he has not seen before when the dressing was taken off today. He is using his juxta light compression to the left leg. 6/10; patient presents for 1 week follow-up. He has been tolerating Hydrofera Blue with 3 layer compression. He again reports bright green drainage. However he denies any signs of infection. He has been using juxta light compression to the left leg. 6/24; patient presents for 2-week follow-up. He has been tolerating Hydrofera Blue with 3 layer compression. Today he is healed. He brought his juxta lite compression for the right  leg and continues to wear the juxta light compression on the left leg READMISSION 05/05/2022 The patient returns to clinic today with a new wound on his left posterior leg. He has been wearing his juxta lite stockings on a regular basis, but on exam he still has 3+ pitting edema. I suspect he has lymphedema in addition to his known venous reflux. The wounds are scattered geographic wounds with light slough accumulation. They have been applying mupirocin and CeraVe ointment. He does have a VP shunt placement scheduled for next week, but it sounds like his neurosurgeon is not aware of the fact that he has an open wound. 05/11/2022: His VP shunt placement has been postponed while we get his wound to heal. The wounds are all smaller today with just a little eschar and minimal slough. Edema control is good. No concern for infection. 05/18/2022: All of the open areas have contracted and there is good perimeter epithelialization. There is a little bit of eschar and slough on the open areas. Good edema control. No concern for infection. 05/26/2022: Many of the small open areas have closed. There is Hydrofera Blue sponge stuck in a number of places and this is unable to be removed by the intake nurse. There is a fair amount of eschar and a little bit of slough present. 06/02/2022: Continued closure of small open skin sites. There are just a couple remaining and these have a little eschar on the surface. Edema control is excellent. 06/09/2022: We are down to just 1 small open area. There is some eschar and slough present. No concern for infection. Edema control is excellent. 06/16/2022: His wound is healed. READMISSION 08/11/2022 Mr. Short underwent a successful VP shunt placement. He returns to clinic today because of a suspicious lesion on his left posterior calf. He actually has no open wounds and his edema control  is excellent. He is wearing his juxta lite stockings religiously. On his left posterior calf,  there is a raised scaly lesion concerning for potential squamous cell carcinoma. 08/25/2022: The biopsy that I took at his last visit was consistent with a well-differentiated squamous cell carcinoma. He has an appointment at the skin surgery center on December 14. The wound from the biopsy has some slough accumulation, but no concern for infection. 11/06/2022: Since his last visit here, he has undergone Mohs surgery for the squamous cell carcinoma that I biopsied. He has a fairly substantial and deep defect. Apparently the procedure took place between 10 to 12 days ago and he has been in an [employer] ever since. When his Unna boot was removed, he was found to have crusting on his anterior tibial surface with small superficial wounds underneath. 11/16/2022: The anterior tibial surface wounds seem to largely be closed with just a couple of tiny open areas under some eschar. The Mohs defect is quite a bit cleaner, but still with nonviable subcutaneous tissue present along with slough and eschar, much of which is dried up Iodoflex. 11/24/2022: The anterior tibial wounds are closed. The Mohs defect is cleaner again this week with much less nonviable tissue. 12/11/2022: The Mohs defect continues to contract. There is good granulation tissue underneath a layer of dried up Iodoflex and some slough. JOHNCHRISTIAN, PAGNI ([id number]) [id number] Page 3 of 10 12/21/2022: The wound is smaller again this week, as well as shallower. There is a little bit of slough on the surface. Electronic Signature(s) Signed: 12/21/2022 11:05:42 AM By: Fredirick Maudlin MD FACS Entered By: Fredirick Maudlin on 12/21/2022 11:05:42 -------------------------------------------------------------------------------- Physical Exam Details Patient Name: Date of Service: [name], [name] 12/21/2022 10:45 A M Medical Record Number: [id number] Patient Account Number: [id number] Date of Birth/Sex: Treating RN: [birth year] (87  y.o. M) Primary Care Provider: [name] Other Clinician: Referring Provider: Treating Provider/Extender: [name] in Treatment: 18 Constitutional Hypertensive, asymptomatic. . . . no acute distress. Respiratory Normal work of breathing on room air. Notes 12/21/2022: The wound is smaller again this week, as well as shallower. There is a little bit of slough on the surface. Electronic Signature(s) Signed: 12/21/2022 11:06:10 AM By: Fredirick Maudlin MD FACS Entered By: Fredirick Maudlin on 12/21/2022 11:06:10 -------------------------------------------------------------------------------- Physician Orders Details Patient Name: Date of Service: [name], [name] 12/21/2022 10:45 A M Medical Record Number: [id number] Patient Account Number: [id number] Date of Birth/Sex: Treating RN: 07-22-1936 (87 y.o. [name] Primary Care Provider: [name] Other Clinician: Referring Provider: Treating Provider/Extender: [name] in Treatment: 18 Verbal / Phone Orders: No Diagnosis Coding ICD-10 Coding Code Description [phone] Non-pressure chronic ulcer of left calf with fat layer exposed L98.9 Disorder of the skin and subcutaneous tissue, unspecified I87.2 Venous insufficiency (chronic) (peripheral) I10 Essential (primary) hypertension Follow-up Appointments ppointment in 1 week. - Dr. Celine Ahr - room 2 Return A Anesthetic (In clinic) Topical Lidocaine 4% applied to wound bed Bathing/ Shower/ Hygiene May shower with protection but do not get wound dressing(s) wet. Protect dressing(s) with water repellant cover (for example, large plastic bag) or a cast cover and may then take shower. Edema Control - Lymphedema / SCD / Other Elevate legs to the level of the heart or above for 30 minutes daily and/or when sitting for 3-4 times a day throughout the day. Avoid standing for long periods of time. Exercise regularly - including ankle circles  while sitting Moisturize legs  daily. - both legs nightly after removing juxtalites Compression stocking or Garment 20-30 mm/Hg pressure to: - both legs daily Wound Treatment Wound #3 - Lower Leg Wound Laterality: Left, Posterior Cleanser: Soap and Water 1 x Per Day/30 Days CHARLTON, HERZFELD ([id number]) [id number] Page 4 of 10 Discharge Instructions: May shower and wash wound with dial antibacterial soap and water prior to dressing change. Cleanser: Wound Cleanser 1 x Per Day/30 Days Discharge Instructions: Cleanse the wound with wound cleanser prior to applying a clean dressing using gauze sponges, not tissue or cotton balls. Peri-Wound Care: Sween Lotion (Moisturizing lotion) 1 x Per Day/30 Days Discharge Instructions: Apply moisturizing lotion as directed Prim Dressing: Promogran Prisma Matrix, 4.34 (sq in) (silver collagen) 1 x Per Day/30 Days ary Discharge Instructions: Moisten collagen with saline or hydrogel Secondary Dressing: ABD Pad, 5x9 1 x Per Day/30 Days Discharge Instructions: Apply over primary dressing as directed. Secondary Dressing: Woven Gauze Sponge, Non-Sterile 4x4 in 1 x Per Day/30 Days Discharge Instructions: Apply over primary dressing as directed. Compression Wrap: ThreePress (3 layer compression wrap) 1 x Per Day/30 Days Discharge Instructions: Apply three layer compression as directed. Patient Medications llergies: No Known Allergies A Notifications Medication Indication Start End 12/21/2022 lidocaine DOSE topical 4 % cream - cream topical Electronic Signature(s) Signed: 12/21/2022 11:50:00 AM By: Fredirick Maudlin MD FACS Entered By: Fredirick Maudlin on 12/21/2022 11:06:23 -------------------------------------------------------------------------------- Problem List Details Patient Name: Date of Service: [name], [name] 12/21/2022 10:45 A M Medical Record Number: [id number] Patient Account Number: [id number] Date of Birth/Sex:  Treating RN: [birth year] (87 y.o. M) Primary Care Provider: [name] Other Clinician: Referring Provider: Treating Provider/Extender: [name] in Treatment: 18 Active Problems ICD-10 Encounter Code Description Active Date MDM Diagnosis L97.222 Non-pressure chronic ulcer of left calf with fat layer exposed 11/06/2022 No Yes L98.9 Disorder of the skin and subcutaneous tissue, unspecified 08/11/2022 No Yes I87.2 Venous insufficiency (chronic) (peripheral) 08/11/2022 No Yes I10 Essential (primary) hypertension 08/11/2022 No Yes Inactive Problems Resolved Problems ICD-10 Code Description Active Date Resolved Date JAREK, BUECKER ([id number]) [id number] Page 5 of 10 [phone] Non-pressure chronic ulcer of other part of left lower leg limited to breakdown of skin 11/06/2022 11/06/2022 Electronic Signature(s) Signed: 12/21/2022 11:05:02 AM By: Fredirick Maudlin MD FACS Entered By: Fredirick Maudlin on 12/21/2022 11:05:01 -------------------------------------------------------------------------------- Progress Note Details Patient Name: Date of Service: [name], [name] 12/21/2022 10:45 A M Medical Record Number: [id number] Patient Account Number: [id number] Date of Birth/Sex: Treating RN: 01-29-1936 (87 y.o. M) Primary Care Provider: [name] Other Clinician: Referring Provider: Treating Provider/Extender: [name] in Treatment: 18 Subjective Chief Complaint Information obtained from Patient Left lower extremity wound History of Present Illness (HPI) [name] is an 87 year old male with a past medical history of CAD s/p DES to LCx 2006, carotid artery disease, hypertension, hyperlipidemia, paroxysmal atrial fibrillation on Eliquis and Cardizem that presents today for right lower extremity wound. He was seen by Dr. Larose Kells, his primary care provider on 4/29 for this issue and was referred to our clinic. Patient states  that he had a small wound that spontaneously started in October 2021 and has not healed. He states this has progressively gotten larger. He has been using acne cream prescribed by the dermatologist for this issue. He reports drainage to the wound but no purulent drainage. He reports mild soreness to the wound. He has swelling in his right leg greater than left and reports this is a chronic  issue for the past 1 to 2 years. He denies resting leg pain or pain with ambulation. Of note He was prescribed doxycycline at the end of last month and has finished this course for possible skin infection to the wound site. 02/06/2021; I am seeing this patient who was admitted to the clinic last week by Dr. Heber Orchard. He has predominantly I think a venous insufficiency wound on the right medial lower leg he has been using Santyl Hydrofera Blue under 3 layer compression. Arterial studies were ordered last week but do not seem to been put through. He is not a diabetic. He did have venous reflux studies done in August 2020. He did have abnormal reflux time was noted in the great saphenous vein in the distal thigh and the great saphenous vein at the knee. There was no evidence of DVT or SVT at the time he was not felt to have large enough saphenous veins on either side for intervention. Compression stockings at least knee-high were recommended. Follow-up was on a as needed basis with Dr. Donzetta Matters The patient does not describe claudication but his pulses in his feet are not vibrant this could be because of the swelling 5/26; patient presents for 1 week follow-up. He has been using Hydrofera Blue under 3 layer compression. He had arterial studies done. He has no issues or complaints today. He denies signs of infection. He has his juxta light compressions with him today. 6/3; patient presents for 1 week follow-up. He has been using Hydrofera Blue under 3 layer compression. He denies signs and symptoms of infection. He did have  bright green drainage which he states he has not seen before when the dressing was taken off today. He is using his juxta light compression to the left leg. 6/10; patient presents for 1 week follow-up. He has been tolerating Hydrofera Blue with 3 layer compression. He again reports bright green drainage. However he denies any signs of infection. He has been using juxta light compression to the left leg. 6/24; patient presents for 2-week follow-up. He has been tolerating Hydrofera Blue with 3 layer compression. Today he is healed. He brought his juxta lite compression for the right leg and continues to wear the juxta light compression on the left leg READMISSION 05/05/2022 The patient returns to clinic today with a new wound on his left posterior leg. He has been wearing his juxta lite stockings on a regular basis, but on exam he still has 3+ pitting edema. I suspect he has lymphedema in addition to his known venous reflux. The wounds are scattered geographic wounds with light slough accumulation. They have been applying mupirocin and CeraVe ointment. He does have a VP shunt placement scheduled for next week, but it sounds like his neurosurgeon is not aware of the fact that he has an open wound. 05/11/2022: His VP shunt placement has been postponed while we get his wound to heal. The wounds are all smaller today with just a little eschar and minimal slough. Edema control is good. No concern for infection. 05/18/2022: All of the open areas have contracted and there is good perimeter epithelialization. There is a little bit of eschar and slough on the open areas. Good edema control. No concern for infection. 05/26/2022: Many of the small open areas have closed. There is Hydrofera Blue sponge stuck in a number of places and this is unable to be removed by the intake nurse. There is a fair amount of eschar and a little bit of slough present.  06/02/2022: Continued closure of small open skin sites. There are  just a couple remaining and these have a little eschar on the surface. Edema control is excellent. 06/09/2022: We are down to just 1 small open area. There is some eschar and slough present. No concern for infection. Edema control is excellent. 06/16/2022: His wound is healed. READMISSION 08/11/2022 Mr. Short underwent a successful VP shunt placement. He returns to clinic today because of a suspicious lesion on his left posterior calf. He actually has no SHAMMOND, PASSON ([id number]) [id number] Page 6 of 10 open wounds and his edema control is excellent. He is wearing his juxta lite stockings religiously. On his left posterior calf, there is a raised scaly lesion concerning for potential squamous cell carcinoma. 08/25/2022: The biopsy that I took at his last visit was consistent with a well-differentiated squamous cell carcinoma. He has an appointment at the skin surgery center on December 14. The wound from the biopsy has some slough accumulation, but no concern for infection. 11/06/2022: Since his last visit here, he has undergone Mohs surgery for the squamous cell carcinoma that I biopsied. He has a fairly substantial and deep defect. Apparently the procedure took place between 10 to 12 days ago and he has been in an [employer] ever since. When his Unna boot was removed, he was found to have crusting on his anterior tibial surface with small superficial wounds underneath. 11/16/2022: The anterior tibial surface wounds seem to largely be closed with just a couple of tiny open areas under some eschar. The Mohs defect is quite a bit cleaner, but still with nonviable subcutaneous tissue present along with slough and eschar, much of which is dried up Iodoflex. 11/24/2022: The anterior tibial wounds are closed. The Mohs defect is cleaner again this week with much less nonviable tissue. 12/11/2022: The Mohs defect continues to contract. There is good granulation tissue underneath a  layer of dried up Iodoflex and some slough. 12/21/2022: The wound is smaller again this week, as well as shallower. There is a little bit of slough on the surface. Patient History Information obtained from Patient. Family History Cancer - Father, Diabetes - Maternal Grandparents, Heart Disease - Maternal Grandparents, Hypertension - Maternal Grandparents, No family history of Hereditary Spherocytosis, Kidney Disease, Lung Disease, Seizures, Stroke, Thyroid Problems, Tuberculosis. Social History Former smoker - Quit over 30 years ago, Marital Status - Married, Alcohol Use - Rarely, Drug Use - No History, Caffeine Use - Daily. Medical History Eyes Patient has history of Glaucoma Cardiovascular Patient has history of Arrhythmia - afib, Coronary Artery Disease, Hypertension, Peripheral Venous Disease Endocrine Denies history of Type I Diabetes, Type II Diabetes Genitourinary Denies history of End Stage Renal Disease Integumentary (Skin) Denies history of History of Burn Musculoskeletal Patient has history of Osteoarthritis Oncologic Denies history of Received Chemotherapy, Received Radiation Psychiatric Denies history of Anorexia/bulimia, Confinement Anxiety Hospitalization/Surgery History - lumbar laminectomy. - total knee arthropathy right. - bil total hip arthroplasty. - coronary stents. - toe surgery. - tonsillectomy. - shunt insertion. Medical A Surgical History Notes nd Gastrointestinal colon polyps Endocrine Hypothyroidism Genitourinary BPH Musculoskeletal Bilat Hip Replacements, Right Knee Replacement, lumbar stenosis Neurologic hydrocephalus Oncologic Skin Cancer multiple sites Objective Constitutional Hypertensive, asymptomatic. no acute distress. Vitals Time Taken: 10:45 AM, Temperature: 98.1 F, Pulse: 74 bpm, Respiratory Rate: 16 breaths/min, Blood Pressure: 151/87 mmHg. Respiratory Normal work of breathing on room air. General Notes: 12/21/2022: The wound is  smaller again this week, as well as shallower. There is a  little bit of slough on the surface. Integumentary (Hair, Skin) DONTARIUS, DEVALL E ([id number]) [id number] Page 7 of 10 Wound #3 status is Open. Original cause of wound was Puncture. The date acquired was: 08/12/2022. The wound has been in treatment 16 weeks. The wound is located on the Left,Posterior Lower Leg. The wound measures 1.8cm length x 0.8cm width x 0.1cm depth; 1.131cm^2 area and 0.113cm^3 volume. There is Fat Layer (Subcutaneous Tissue) exposed. There is no tunneling or undermining noted. There is a medium amount of serosanguineous drainage noted. The wound margin is distinct with the outline attached to the wound base. There is large (67-100%) red granulation within the wound bed. There is a small (1-33%) amount of necrotic tissue within the wound bed including Adherent Slough. The periwound skin appearance had no abnormalities noted for texture. The periwound skin appearance had no abnormalities noted for moisture. The periwound skin appearance exhibited: Hemosiderin Staining. Periwound temperature was noted as No Abnormality. Assessment Active Problems ICD-10 Non-pressure chronic ulcer of left calf with fat layer exposed Disorder of the skin and subcutaneous tissue, unspecified Venous insufficiency (chronic) (peripheral) Essential (primary) hypertension Procedures Wound #3 Pre-procedure diagnosis of Wound #3 is a Lesion located on the Left,Posterior Lower Leg . There was a Selective/Open Wound Non-Viable Tissue Debridement with a total area of 1.44 sq cm performed by Fredirick Maudlin, MD. With the following instrument(s): Curette to remove Non-Viable tissue/material. Material removed includes [facility] after achieving pain control using Lidocaine 4% Topical Solution. No specimens were taken. A time out was conducted at 11:01, prior to the start of the procedure. A Minimum amount of bleeding was  controlled with Pressure. The procedure was tolerated well. Post Debridement Measurements: 1.8cm length x 0.8cm width x 0.1cm depth; 0.113cm^3 volume. Character of Wound/Ulcer Post Debridement is improved. Post procedure Diagnosis Wound #3: Same as Pre-Procedure General Notes: scribed for Dr. Celine Ahr by [name], RN. Pre-procedure diagnosis of Wound #3 is a Lesion located on the Left,Posterior Lower Leg . There was a Double Layer Compression Therapy Procedure by [name], RN. Post procedure Diagnosis Wound #3: Same as Pre-Procedure Plan Follow-up Appointments: Return Appointment in 1 week. - Dr. Celine Ahr - room 2 Anesthetic: (In clinic) Topical Lidocaine 4% applied to wound bed Bathing/ Shower/ Hygiene: May shower with protection but do not get wound dressing(s) wet. Protect dressing(s) with water repellant cover (for example, large plastic bag) or a cast cover and may then take shower. Edema Control - Lymphedema / SCD / Other: Elevate legs to the level of the heart or above for 30 minutes daily and/or when sitting for 3-4 times a day throughout the day. Avoid standing for long periods of time. Exercise regularly - including ankle circles while sitting Moisturize legs daily. - both legs nightly after removing juxtalites Compression stocking or Garment 20-30 mm/Hg pressure to: - both legs daily The following medication(s) was prescribed: lidocaine topical 4 % cream cream topical was prescribed at facility WOUND #3: - Lower Leg Wound Laterality: Left, Posterior Cleanser: Soap and Water 1 x Per Day/30 Days Discharge Instructions: May shower and wash wound with dial antibacterial soap and water prior to dressing change. Cleanser: Wound Cleanser 1 x Per Day/30 Days Discharge Instructions: Cleanse the wound with wound cleanser prior to applying a clean dressing using gauze sponges, not tissue or cotton balls. Peri-Wound Care: Sween Lotion (Moisturizing lotion) 1 x Per Day/30  Days Discharge Instructions: Apply moisturizing lotion as directed Prim Dressing: Promogran Prisma Matrix, 4.34 (sq in) (silver collagen) 1 x  Per Day/30 Days ary Discharge Instructions: Moisten collagen with saline or hydrogel Secondary Dressing: ABD Pad, 5x9 1 x Per Day/30 Days Discharge Instructions: Apply over primary dressing as directed. Secondary Dressing: Woven Gauze Sponge, Non-Sterile 4x4 in 1 x Per Day/30 Days Discharge Instructions: Apply over primary dressing as directed. Com pression Wrap: ThreePress (3 layer compression wrap) 1 x Per Day/30 Days Discharge Instructions: Apply three layer compression as directed. 12/21/2022: The wound is smaller again this week, as well as shallower. There is a little bit of slough on the surface. I used a curette to debride slough from the wound surface. We will continue Prisma silver collagen and 3 layer compression equivalent. Follow-up in 1 week. DENAHI, LADZINSKI ([id number]) [id number] Page 8 of 10 Electronic Signature(s) Signed: 12/21/2022 11:06:44 AM By: Fredirick Maudlin MD FACS Entered By: Fredirick Maudlin on 12/21/2022 11:06:44 -------------------------------------------------------------------------------- HxROS Details Patient Name: Date of Service: [name], [name] 12/21/2022 10:45 A M Medical Record Number: [id number] Patient Account Number: [id number] Date of Birth/Sex: Treating RN: 1936/07/30 (87 y.o. M) Primary Care Provider: [name] Other Clinician: Referring Provider: Treating Provider/Extender: [name] in Treatment: 18 Information Obtained From Patient Eyes Medical History: Positive for: Glaucoma Cardiovascular Medical History: Positive for: Arrhythmia - afib; Coronary Artery Disease; Hypertension; Peripheral Venous Disease Gastrointestinal Medical History: Past Medical History Notes: colon polyps Endocrine Medical History: Negative for: Type I Diabetes; Type II  Diabetes Past Medical History Notes: Hypothyroidism Genitourinary Medical History: Negative for: End Stage Renal Disease Past Medical History Notes: BPH Integumentary (Skin) Medical History: Negative for: History of Burn Musculoskeletal Medical History: Positive for: Osteoarthritis Past Medical History Notes: Bilat Hip Replacements, Right Knee Replacement, lumbar stenosis Neurologic Medical History: Past Medical History Notes: hydrocephalus Oncologic Medical History: Negative for: Received Chemotherapy; Received Radiation Past Medical History Notes: Skin Cancer multiple sites Psychiatric Medical History: COLVIN, RAYHILL ([id number]) [id number] Page 9 of 10 Negative for: Anorexia/bulimia; Confinement Anxiety HBO Extended History Items Eyes: Glaucoma Immunizations Pneumococcal Vaccine: Received Pneumococcal Vaccination: Yes Received Pneumococcal Vaccination On or After 60th Birthday: Yes Implantable Devices None Hospitalization / Surgery History Type of Hospitalization/Surgery lumbar laminectomy total knee arthropathy right bil total hip arthroplasty coronary stents toe surgery tonsillectomy shunt insertion Family and Social History Cancer: Yes - Father; Diabetes: Yes - Maternal Grandparents; Heart Disease: Yes - Maternal Grandparents; Hereditary Spherocytosis: No; Hypertension: Yes - Maternal Grandparents; Kidney Disease: No; Lung Disease: No; Seizures: No; Stroke: No; Thyroid Problems: No; Tuberculosis: No; Former smoker - Quit over 30 years ago; Marital Status - Married; Alcohol Use: Rarely; Drug Use: No History; Caffeine Use: Daily; Financial Concerns: No; Food, Clothing or Shelter Needs: No; Support System Lacking: No; Transportation Concerns: No Electronic Signature(s) Signed: 12/21/2022 11:50:00 AM By: Fredirick Maudlin MD FACS Entered By: Fredirick Maudlin on 12/21/2022  11:05:50 -------------------------------------------------------------------------------- SuperBill Details Patient Name: Date of Service: [name], [name] 12/21/2022 Medical Record Number: [id number] Patient Account Number: [id number] Date of Birth/Sex: Treating RN: [birth year] (87 y.o. M) Primary Care Provider: [name] Other Clinician: Referring Provider: Treating Provider/Extender: [name] in Treatment: 18 Diagnosis Coding ICD-10 Codes Code Description [phone] Non-pressure chronic ulcer of left calf with fat layer exposed L98.9 Disorder of the skin and subcutaneous tissue, unspecified I87.2 Venous insufficiency (chronic) (peripheral) I10 Essential (primary) hypertension Facility Procedures : CPT4 Code: [id number] Description: [id number] - DEBRIDE WOUND 1ST 20 SQ CM OR < ICD-10 Diagnosis Description L97.222 Non-pressure chronic ulcer of left calf with fat layer exposed Modifier: Quantity:  1 Physician Procedures : CPT4 Code Description Modifier [id number] - WC PHYS LEVEL 3 - EST PT 25 ICD-10 Diagnosis Description L97.222 Non-pressure chronic ulcer of left calf with fat layer exposed L98.9 Disorder of the skin and subcutaneous tissue, unspecified I87.2 Venous  insufficiency (chronic) (peripheral) I10 Essential (primary) hypertension ONUR, DEJA E ([id number]) [phone].pdf Quantity: 1 Page 10 of 10 : [id number] - WC PHYS DEBR WO ANESTH 20 SQ CM 1 ICD-10 Diagnosis Description L97.222 Non-pressure chronic ulcer of left calf with fat layer exposed Quantity: Electronic Signature(s) Signed: 12/21/2022 11:07:00 AM By: Fredirick Maudlin MD FACS Entered By: Fredirick Maudlin on 12/21/2022 11:06:59

## 2022-12-23 ENCOUNTER — Other Ambulatory Visit: Payer: Self-pay | Admitting: Internal Medicine

## 2022-12-29 ENCOUNTER — Encounter (HOSPITAL_BASED_OUTPATIENT_CLINIC_OR_DEPARTMENT_OTHER): Payer: Medicare Other | Admitting: General Surgery

## 2022-12-29 DIAGNOSIS — I890 Lymphedema, not elsewhere classified: Secondary | ICD-10-CM | POA: Diagnosis not present

## 2022-12-29 DIAGNOSIS — L97812 Non-pressure chronic ulcer of other part of right lower leg with fat layer exposed: Secondary | ICD-10-CM | POA: Diagnosis not present

## 2022-12-29 DIAGNOSIS — I872 Venous insufficiency (chronic) (peripheral): Secondary | ICD-10-CM | POA: Diagnosis not present

## 2022-12-29 DIAGNOSIS — I2510 Atherosclerotic heart disease of native coronary artery without angina pectoris: Secondary | ICD-10-CM | POA: Diagnosis not present

## 2022-12-29 DIAGNOSIS — E785 Hyperlipidemia, unspecified: Secondary | ICD-10-CM | POA: Diagnosis not present

## 2022-12-29 DIAGNOSIS — Z955 Presence of coronary angioplasty implant and graft: Secondary | ICD-10-CM | POA: Diagnosis not present

## 2022-12-29 DIAGNOSIS — Z982 Presence of cerebrospinal fluid drainage device: Secondary | ICD-10-CM | POA: Diagnosis not present

## 2022-12-29 DIAGNOSIS — I10 Essential (primary) hypertension: Secondary | ICD-10-CM | POA: Diagnosis not present

## 2022-12-29 DIAGNOSIS — L988 Other specified disorders of the skin and subcutaneous tissue: Secondary | ICD-10-CM | POA: Diagnosis not present

## 2022-12-29 DIAGNOSIS — L97222 Non-pressure chronic ulcer of left calf with fat layer exposed: Secondary | ICD-10-CM | POA: Diagnosis not present

## 2022-12-29 DIAGNOSIS — Z7901 Long term (current) use of anticoagulants: Secondary | ICD-10-CM | POA: Diagnosis not present

## 2022-12-29 DIAGNOSIS — Z79899 Other long term (current) drug therapy: Secondary | ICD-10-CM | POA: Diagnosis not present

## 2022-12-29 DIAGNOSIS — I480 Paroxysmal atrial fibrillation: Secondary | ICD-10-CM | POA: Diagnosis not present

## 2022-12-29 NOTE — Progress Notes (Signed)
JAC, BOOTON E ([id number]) [id number] Page 1 of 8 Visit Report for 12/29/2022 Arrival Information Details Patient Name: Date of Service: [name], [name] 12/29/2022 2:00 PM Medical Record Number: [id number] Patient Account Number: [id number] Date of Birth/Sex: Treating RN: December 15, 1935 (87 y.o. [name] Primary Care Robt Okuda: Willow Ora Other Clinician: Referring Moncerrat Burnstein: Treating Elesa Garman/Extender: Cresenciano Genre in Treatment: 20 Visit Information History Since Last Visit Added or deleted any medications: No Patient Arrived: Gilmer Mor Any new allergies or adverse reactions: No Arrival Time: 14:00 Had a fall or experienced change in No Accompanied By: spouse activities of daily living that may affect Transfer Assistance: None risk of falls: Patient Identification Verified: Yes Signs or symptoms of abuse/neglect since last visito No Secondary Verification Process Completed: Yes Hospitalized since last visit: No Patient Requires Transmission-Based Precautions: No Implantable device outside of the clinic excluding No Patient Has Alerts: No cellular tissue based products placed in the center since last visit: Has Dressing in Place as Prescribed: Yes Has Compression in Place as Prescribed: Yes Pain Present Now: No Electronic Signature(s) Signed: 12/29/2022 3:43:44 PM By: Samuella Bruin Entered By: Samuella Bruin on 12/29/2022 14:00:27 -------------------------------------------------------------------------------- Compression Therapy Details Patient Name: Date of Service: [name], [name] 12/29/2022 2:00 PM Medical Record Number: [id number] Patient Account Number: [id number] Date of Birth/Sex: Treating RN: 24-Aug-1936 (87 y.o. [name] Primary Care Seith Aikey: Willow Ora Other Clinician: Referring Devondre Guzzetta: Treating Gerardo Territo/Extender: Cresenciano Genre in Treatment: 20 Compression Therapy Performed  for Wound Assessment: Wound #3 Left,Posterior Lower Leg Performed By: Clinician Samuella Bruin, RN Compression Type: Double Layer Post Procedure Diagnosis Same as Pre-procedure Electronic Signature(s) Signed: 12/29/2022 3:43:44 PM By: Gelene Mink By: Samuella Bruin on 12/29/2022 14:23:09 -------------------------------------------------------------------------------- Compression Therapy Details Patient Name: Date of Service: [name], [name] 12/29/2022 2:00 PM Medical Record Number: [id number] Patient Account Number: [id number] Date of Birth/Sex: Treating RN: Nov 25, 1935 (87 y.o. [name] Primary Care Jayzon Taras: Willow Ora Other Clinician: Referring Kamrin Spath: Treating Tniya Bowditch/Extender: Cresenciano Genre in Treatment: 20 Compression Therapy Performed for Wound Assessment: Wound #5 Right,Medial Lower Leg Performed By: Clinician Samuella Bruin, RN Compression Type: Double Layer Post Procedure Diagnosis Same as Pre-procedure MATHEU, CAGNINA E ([id number]) [id number] Page 2 of 8 Electronic Signature(s) Signed: 12/29/2022 3:43:44 PM By: Gelene Mink By: Samuella Bruin on 12/29/2022 14:23:09 -------------------------------------------------------------------------------- Encounter Discharge Information Details Patient Name: Date of Service: [name], [name] 12/29/2022 2:00 PM Medical Record Number: [id number] Patient Account Number: [id number] Date of Birth/Sex: Treating RN: [birth year] (87 y.o. [name] Primary Care Yobani Schertzer: Willow Ora Other Clinician: Referring Sundeep Destin: Treating Dasan Hardman/Extender: Cresenciano Genre in Treatment: 20 Encounter Discharge Information Items Post Procedure Vitals Discharge Condition: Stable Temperature (F): 97.8 Ambulatory Status: Cane Pulse (bpm): 73 Discharge Destination: Home Respiratory Rate (breaths/min): 20 Transportation:  Private Auto Blood Pressure (mmHg): 160/82 Accompanied By: spouse Schedule Follow-up Appointment: Yes Clinical Summary of Care: Patient Declined Electronic Signature(s) Signed: 12/29/2022 3:43:44 PM By: Samuella Bruin Entered By: Samuella Bruin on 12/29/2022 14:41:15 -------------------------------------------------------------------------------- Lower Extremity Assessment Details Patient Name: Date of Service: [name], [name] 12/29/2022 2:00 PM Medical Record Number: [id number] Patient Account Number: [id number] Date of Birth/Sex: Treating RN: 1936/01/10 (87 y.o. [name] Primary Care Anjalina Bergevin: Willow Ora Other Clinician: Referring Belma Dyches: Treating Nickisha Hum/Extender: Elsie Saas Weeks in Treatment: 20 Edema Assessment Assessed: [Left: No] [Right: No] [Left: Edema] [Right: :] Calf Left: Right: Point of Measurement: From  Medial Instep 35 cm 47.5 cm Ankle Left: Right: Point of Measurement: From Medial Instep 22.9 cm 25 cm Vascular Assessment Pulses: Dorsalis Pedis Palpable: [Left:Yes] [Right:Yes] Electronic Signature(s) Signed: 12/29/2022 3:43:44 PM By: Samuella Bruin Entered By: Samuella Bruin on 12/29/2022 14:11:55 Multi Wound Chart Details -------------------------------------------------------------------------------- Shella Spearing ([id number]) [id number] Page 3 of 8 Patient Name: Date of Service: [name], [name] 12/29/2022 2:00 PM Medical Record Number: [id number] Patient Account Number: [id number] Date of Birth/Sex: Treating RN: [birth year] (87 y.o. M) Primary Care [name]: Willow Ora Other Clinician: Referring Delisia Mcquiston: Treating Ashyra Cantin/Extender: Cresenciano Genre in Treatment: 20 Vital Signs Height(in): Pulse(bpm): 73 Weight(lbs): Blood Pressure(mmHg): 160/82 Body Mass Index(BMI): Temperature(F): 97.8 Respiratory Rate(breaths/min): 20 Wound Assessments Wound Number: 3 5  N/A Photos: N/A Left, Posterior Lower Leg Right, Medial Lower Leg N/A Wound Location: Puncture Gradually Appeared N/A Wounding Event: Lesion Lymphedema N/A Primary Etiology: Glaucoma, Arrhythmia, Coronary Glaucoma, Arrhythmia, Coronary N/A Comorbid History: Artery Disease, Hypertension, Artery Disease, Hypertension, Peripheral Venous Disease, Peripheral Venous Disease, Osteoarthritis Osteoarthritis 08/12/2022 12/29/2022 N/A Date Acquired: 18 0 N/A Weeks of Treatment: Open Open N/A Wound Status: No No N/A Wound Recurrence: 1.5x0.9x0.1 8.5x12x0.1 N/A Measurements L x W x D (cm) 1.06 80.111 N/A A (cm) : rea 0.106 8.011 N/A Volume (cm) : -1393.00% N/A N/A % Reduction in A rea: -657.10% N/A N/A % Reduction in Volume: Full Thickness Without Exposed Full Thickness Without Exposed N/A Classification: Support Structures Support Structures Medium Medium N/A Exudate A mount: Serosanguineous Serous N/A Exudate Type: red, brown amber N/A Exudate Color: Distinct, outline attached Distinct, outline attached N/A Wound Margin: Large (67-100%) Large (67-100%) N/A Granulation A mount: Red Red, Pink N/A Granulation Quality: Small (1-33%) Small (1-33%) N/A Necrotic A mount: Adherent Slough Eschar, Adherent Slough N/A Necrotic Tissue: Fat Layer (Subcutaneous Tissue): Yes Fat Layer (Subcutaneous Tissue): Yes N/A Exposed Structures: Fascia: No Fascia: No Tendon: No Tendon: No Muscle: No Muscle: No Joint: No Joint: No Bone: No Bone: No Medium (34-66%) None N/A Epithelialization: Debridement - Excisional Debridement - Selective/Open Wound N/A Debridement: Pre-procedure Verification/Time Out 14:19 14:19 N/A Taken: Lidocaine 4% Topical Solution Lidocaine 4% Topical Solution N/A Pain Control: Necrotic/Eschar, Subcutaneous, Necrotic/Eschar, Slough N/A Tissue Debrided: Slough Skin/Subcutaneous Tissue Non-Viable Tissue N/A Level: 1.35 20 N/A Debridement A (sq  cm): rea Curette Curette N/A Instrument: Minimum Minimum N/A Bleeding: Pressure Pressure N/A Hemostasis Achieved: Debridement Treatment Response: Procedure was tolerated well Procedure was tolerated well N/A Post Debridement Measurements L x 1.5x0.9x0.1 8.5x12x0.1 N/A W x D (cm) 0.106 8.011 N/A Post Debridement Volume: (cm) No Abnormalities Noted No Abnormalities Noted N/A Periwound Skin Texture: No Abnormalities Noted Dry/Scaly: Yes N/A Periwound Skin Moisture: Hemosiderin Staining: Yes Hemosiderin Staining: No N/A Periwound Skin Color: No Abnormality No Abnormality N/A Temperature: Compression Therapy Compression Therapy N/A Procedures Performed: Debridement Debridement KNIGHTLY, MARKELL ([id number]) [id number] Page 4 of 8 Treatment Notes Electronic Signature(s) Signed: 12/29/2022 2:38:04 PM By: Duanne Guess MD FACS Entered By: Duanne Guess on 12/29/2022 14:38:04 -------------------------------------------------------------------------------- Multi-Disciplinary Care Plan Details Patient Name: Date of Service: [name], [name] 12/29/2022 2:00 PM Medical Record Number: [id number] Patient Account Number: [id number] Date of Birth/Sex: Treating RN: [birth year] (87 y.o. [name] Primary Care Kenetra Hildenbrand: Willow Ora Other Clinician: Referring Rosann Gorum: Treating Ruffin Lada/Extender: Cresenciano Genre in Treatment: 20 Active Inactive Necrotic Tissue Nursing Diagnoses: Impaired tissue integrity related to necrotic/devitalized tissue Knowledge deficit related to management of necrotic/devitalized tissue Goals: Necrotic/devitalized tissue will be minimized in the wound bed Date  Initiated: 08/25/2022 Target Resolution Date: 01/22/2023 Goal Status: Active Patient/caregiver will verbalize understanding of reason and process for debridement of necrotic tissue Date Initiated: 08/25/2022 Target Resolution Date: 01/22/2023 Goal Status:  Active Interventions: Assess patient pain level pre-, during and post procedure and prior to discharge Provide education on necrotic tissue and debridement process Treatment Activities: Apply topical anesthetic as ordered : 08/25/2022 Notes: Electronic Signature(s) Signed: 12/29/2022 3:43:44 PM By: Samuella BruinHerrington, Taylor Entered By: Samuella BruinHerrington, Taylor on 12/29/2022 14:22:21 -------------------------------------------------------------------------------- Pain Assessment Details Patient Name: Date of Service: [name], [name] 12/29/2022 2:00 PM Medical Record Number: [id number] Patient Account Number: [id number] Date of Birth/Sex: Treating RN: 10-11-1935 (87 y.o. Marlan PalauM) Herrington, Taylor Primary Care Arta Stump: Willow OraPaz, Jose Other Clinician: Referring Pattie Flaharty: Treating Skippy Marhefka/Extender: Cresenciano Genreannon, Jennifer Paz, Jose Weeks in Treatment: 20 Active Problems Location of Pain Severity and Description of Pain Patient Has Paino No Site Locations Rate the pain. Raj JanusSHORE, Shine E ([id number]) [id number] Page 5 of 8 Rate the pain. Current Pain Level: 0 Pain Management and Medication Current Pain Management: Electronic Signature(s) Signed: 12/29/2022 3:43:44 PM By: Samuella BruinHerrington, Taylor Entered By: Samuella BruinHerrington, Taylor on 12/29/2022 14:03:22 -------------------------------------------------------------------------------- Patient/Caregiver Education Details Patient Name: Date of Service: [name], [name] 4/9/2024andnbsp2:00 PM Medical Record Number: [id number] Patient Account Number: [id number] Date of Birth/Gender: Treating RN: 10-11-1935 (87 y.o. Marlan PalauM) Herrington, Taylor Primary Care Physician: Willow OraPaz, Jose Other Clinician: Referring Physician: Treating Physician/Extender: Cresenciano Genreannon, Jennifer Paz, Jose Weeks in Treatment: 20 Education Assessment Education Provided To: Patient Education Topics Provided Wound/Skin Impairment: Methods: Explain/Verbal Responses: Reinforcements needed,  State content correctly Electronic Signature(s) Signed: 12/29/2022 3:43:44 PM By: Samuella BruinHerrington, Taylor Entered By: Samuella BruinHerrington, Taylor on 12/29/2022 14:22:37 -------------------------------------------------------------------------------- Wound Assessment Details Patient Name: Date of Service: [name], [name] 12/29/2022 2:00 PM Medical Record Number: [id number] Patient Account Number: [id number] Date of Birth/Sex: Treating RN: 10-11-1935 (87 y.o. Marlan PalauM) Herrington, Taylor Primary Care Nicky Kras: Willow OraPaz, Jose Other Clinician: Referring Devonne Lalani: Treating Dazaria Macneill/Extender: Elsie Saasannon, Jennifer Paz, Jose Weeks in Treatment: 20 Wound Status Wound Number: 3 Primary Lesion Etiology: Wound Location: Left, Posterior Lower Leg Wound Open Wounding Event: Puncture Status: Date Acquired: 08/12/2022 Comorbid Glaucoma, Arrhythmia, Coronary Artery Disease, Hypertension, Weeks Of Treatment: 18 History: Peripheral Venous Disease, Osteoarthritis Clustered Wound: No Raj JanusSHORE, Joseph E ([id number]) [id number] Page 6 of 8 Photos Wound Measurements Length: (cm) 1.5 Width: (cm) 0.9 Depth: (cm) 0.1 Area: (cm) 1.06 Volume: (cm) 0.106 % Reduction in Area: -1393% % Reduction in Volume: -657.1% Epithelialization: Medium (34-66%) Tunneling: No Undermining: No Wound Description Classification: Full Thickness Without Exposed Support Structures Wound Margin: Distinct, outline attached Exudate Amount: Medium Exudate Type: Serosanguineous Exudate Color: red, brown Foul Odor After Cleansing: No Slough/Fibrino Yes Wound Bed Granulation Amount: Large (67-100%) Exposed Structure Granulation Quality: Red Fascia Exposed: No Necrotic Amount: Small (1-33%) Fat Layer (Subcutaneous Tissue) Exposed: Yes Necrotic Quality: Adherent Slough Tendon Exposed: No Muscle Exposed: No Joint Exposed: No Bone Exposed: No Periwound Skin Texture Texture Color No Abnormalities Noted: Yes No Abnormalities  Noted: No Hemosiderin Staining: Yes Moisture No Abnormalities Noted: Yes Temperature / Pain Temperature: No Abnormality Treatment Notes Wound #3 (Lower Leg) Wound Laterality: Left, Posterior Cleanser Soap and Water Discharge Instruction: May shower and wash wound with dial antibacterial soap and water prior to dressing change. Wound Cleanser Discharge Instruction: Cleanse the wound with wound cleanser prior to applying a clean dressing using gauze sponges, not tissue or cotton balls. Peri-Wound Care Sween Lotion (Moisturizing lotion) Discharge Instruction: Apply moisturizing lotion as directed Topical Primary Dressing Promogran Prisma Matrix, 4.34 (sq  in) (silver collagen) Discharge Instruction: Moisten collagen with saline or hydrogel Secondary Dressing ABD Pad, 5x9 Discharge Instruction: Apply over primary dressing as directed. Woven Gauze Sponge, Non-Sterile 4x4 in Discharge Instruction: Apply over primary dressing as directed. Secured With BAYLEN, BUCKNER ([id number]) [id number] Page 7 of 8 Compression Wrap Urgo K2 Lite, two layer compression system, regular Discharge Instruction: Apply Urgo K2 Lite as directed (alternative to 3 layer compression). Compression Stockings Add-Ons Electronic Signature(s) Signed: 12/29/2022 3:43:44 PM By: Samuella Bruin Entered By: Samuella Bruin on 12/29/2022 14:15:31 -------------------------------------------------------------------------------- Wound Assessment Details Patient Name: Date of Service: [name], [name] 12/29/2022 2:00 PM Medical Record Number: [id number] Patient Account Number: [id number] Date of Birth/Sex: Treating RN: 02-Jun-1936 (87 y.o. [name] Primary Care Joan Avetisyan: Willow Ora Other Clinician: Referring Bladyn Tipps: Treating Bryna Razavi/Extender: Elsie Saas Weeks in Treatment: 20 Wound Status Wound Number: 5 Primary Lymphedema Etiology: Wound Location: Right,  Medial Lower Leg Wound Open Wounding Event: Gradually Appeared Status: Date Acquired: 12/29/2022 Comorbid Glaucoma, Arrhythmia, Coronary Artery Disease, Hypertension, Weeks Of Treatment: 0 History: Peripheral Venous Disease, Osteoarthritis Clustered Wound: No Photos Wound Measurements Length: (cm) 8.5 Width: (cm) 12 Depth: (cm) 0.1 Area: (cm) 80.111 Volume: (cm) 8.011 % Reduction in Area: % Reduction in Volume: Epithelialization: None Tunneling: No Undermining: No Wound Description Classification: Full Thickness Without Exposed Suppor Wound Margin: Distinct, outline attached Exudate Amount: Medium Exudate Type: Serous Exudate Color: amber t Structures Foul Odor After Cleansing: No Slough/Fibrino Yes Wound Bed Granulation Amount: Large (67-100%) Exposed Structure Granulation Quality: Red, Pink Fascia Exposed: No Necrotic Amount: Small (1-33%) Fat Layer (Subcutaneous Tissue) Exposed: Yes Necrotic Quality: Eschar, Adherent Slough Tendon Exposed: No Muscle Exposed: No Joint Exposed: No Bone Exposed: No Periwound Skin Texture Texture Color No Abnormalities Noted: Yes No Abnormalities Noted: No Hemosiderin Staining: No [address] QUADRY, KAMPA E ([id number]) [id number] Page 8 of 8 No Abnormalities Noted: No Temperature / Pain Dry / Scaly: Yes Temperature: No Abnormality Treatment Notes Wound #5 (Lower Leg) Wound Laterality: Right, Medial Cleanser Soap and Water Discharge Instruction: May shower and wash wound with dial antibacterial soap and water prior to dressing change. Wound Cleanser Discharge Instruction: Cleanse the wound with wound cleanser prior to applying a clean dressing using gauze sponges, not tissue or cotton balls. Peri-Wound Care Sween Lotion (Moisturizing lotion) Discharge Instruction: Apply moisturizing lotion as directed Topical Primary Dressing Maxorb Extra Ag+ Alginate Dressing, 4x4.75 (in/in) Discharge Instruction:  Apply to wound bed as instructed Secondary Dressing ABD Pad, 5x9 Discharge Instruction: Apply over primary dressing as directed. Woven Gauze Sponge, Non-Sterile 4x4 in Discharge Instruction: Apply over primary dressing as directed. Secured With Compression Wrap Urgo K2 Lite, two layer compression system, regular Discharge Instruction: Apply Urgo K2 Lite as directed (alternative to 3 layer compression). Compression Stockings Add-Ons Electronic Signature(s) Signed: 12/29/2022 3:43:44 PM By: Samuella Bruin Entered By: Samuella Bruin on 12/29/2022 14:15:49 -------------------------------------------------------------------------------- Vitals Details Patient Name: Date of Service: [name], [name] 12/29/2022 2:00 PM Medical Record Number: [id number] Patient Account Number: [id number] Date of Birth/Sex: Treating RN: [birth year] (87 y.o. [name] Primary Care [place] Zaragoza: Willow Ora Other Clinician: Referring Tamicka Shimon: Treating Latayvia Mandujano/Extender: Cresenciano Genre in Treatment: 20 Vital Signs Time Taken: 14:02 Temperature (F): 97.8 Pulse (bpm): 73 Respiratory Rate (breaths/min): 20 Blood Pressure (mmHg): 160/82 Reference Range: 80 - 120 mg / dl Electronic Signature(s) Signed: 12/29/2022 3:43:44 PM By: Samuella Bruin Entered By: Samuella Bruin on 12/29/2022 14:03:16

## 2022-12-29 NOTE — Progress Notes (Signed)
[name], [name] ([id number]) [id number] Page 1 of 11 Visit Report for 12/29/2022 Chief Complaint Document Details Patient Name: Date of Service: [name] 12/29/2022 2:00 PM Medical Record Number: [id number] Patient Account Number: [id number] Date of Birth/Sex: Treating RN: 1936/06/28 (87 y.o. M) Primary Care Provider: Willow [name] Other Clinician: Referring Provider: Treating Provider/Extender: [name] in Treatment: 20 Information Obtained from: Patient Chief Complaint Left lower extremity wound Electronic Signature(s) Signed: 12/29/2022 2:38:15 PM By: [name] Guess MD FACS Entered By: [name] on 12/29/2022 14:38:15 -------------------------------------------------------------------------------- Debridement Details Patient Name: Date of Service: [name] 12/29/2022 2:00 PM Medical Record Number: [id number] Patient Account Number: [id number] Date of Birth/Sex: Treating RN: 10/27/1935 (87 y.o. [name] Primary Care Provider: Willow [name] Other Clinician: Referring Provider: Treating Provider/Extender: [name] in Treatment: 20 Debridement Performed for Assessment: Wound #5 Right,Medial Lower Leg Performed By: Physician [name] Guess, MD Debridement Type: Debridement Level of Consciousness (Pre-procedure): Awake and Alert Pre-procedure Verification/Time Out Yes - 14:19 Taken: Start Time: 14:19 Pain Control: Lidocaine 4% Topical Solution T Area Debrided (L x W): otal 4 (cm) x 5 (cm) = 20 (cm) Tissue and other material debrided: Non-Viable, Eschar, Slough, Slough Level: Non-Viable Tissue Debridement Description: Selective/Open Wound Instrument: Curette Bleeding: Minimum Hemostasis Achieved: Pressure Response to Treatment: Procedure was tolerated well Level of Consciousness (Post- Awake and Alert procedure): Post Debridement Measurements of Total Wound Length: (cm)  8.5 Width: (cm) 12 Depth: (cm) 0.1 Volume: (cm) 8.011 Character of Wound/Ulcer Post Debridement: Improved Post Procedure Diagnosis Same as Pre-procedure Notes scribed for [name] by Samuella Bruin, RN Electronic Signature(s) Signed: 12/29/2022 3:09:48 PM By: [name] Guess MD FACS Signed: 12/29/2022 3:43:44 PM By: Samuella [name] Entered By: Samuella [name] on 12/29/2022 14:22:25 [name] ([id number]) [id number] Page 2 of 11 -------------------------------------------------------------------------------- Debridement Details Patient Name: Date of Service: [name] 12/29/2022 2:00 PM Medical Record Number: [id number] Patient Account Number: [id number] Date of Birth/Sex: Treating RN: March 20, 1936 (87 y.o. [name] Primary Care Provider: Willow [name] Other Clinician: Referring Provider: Treating Provider/Extender: [name] in Treatment: 20 Debridement Performed for Assessment: Wound #3 Left,Posterior Lower Leg Performed By: Physician [name] Guess, MD Debridement Type: Debridement Level of Consciousness (Pre-procedure): Awake and Alert Pre-procedure Verification/Time Out Yes - 14:19 Taken: Start Time: 14:19 Pain Control: Lidocaine 4% T opical Solution T Area Debrided (L x W): otal 1.5 (cm) x 0.9 (cm) = 1.35 (cm) Tissue and other material debrided: Non-Viable, Eschar, Slough, Subcutaneous, Slough Level: Skin/Subcutaneous Tissue Debridement Description: Excisional Instrument: Curette Bleeding: Minimum Hemostasis Achieved: Pressure Response to Treatment: Procedure was tolerated well Level of Consciousness (Post- Awake and Alert procedure): Post Debridement Measurements of Total Wound Length: (cm) 1.5 Width: (cm) 0.9 Depth: (cm) 0.1 Volume: (cm) 0.106 Character of Wound/Ulcer Post Debridement: Improved Post Procedure Diagnosis Same as Pre-procedure Notes scribed for [name] by  Samuella Bruin, RN Electronic Signature(s) Signed: 12/29/2022 3:09:48 PM By: [name] Guess MD FACS Signed: 12/29/2022 3:43:44 PM By: Samuella [name] Entered By: Samuella [name] on 12/29/2022 14:25:13 -------------------------------------------------------------------------------- HPI Details Patient Name: Date of Service: [name] 12/29/2022 2:00 PM Medical Record Number: [id number] Patient Account Number: [id number] Date of Birth/Sex: Treating RN: 04-10-1936 (87 y.o. M) Primary Care Provider: Willow [name] Other Clinician: Referring Provider: Treating Provider/Extender: [name] in Treatment: 20 History of Present Illness HPI Description: [name] is an 87 year old male with a past medical history of  CAD s/p DES to LCx 2006, carotid artery disease, hypertension, hyperlipidemia, paroxysmal atrial fibrillation on Eliquis and Cardizem that presents today for right lower extremity wound. He was seen by [name], his primary care provider on 4/29 for this issue and was referred to our clinic. Patient states that he had a small wound that spontaneously started in October 2021 and has not healed. He states this has progressively gotten larger. He has been using acne cream prescribed by the dermatologist for this issue. He reports drainage to the wound but no purulent drainage. He reports mild soreness to the wound. He has swelling in his right leg greater than left and reports this is a chronic issue for the past 1 to 2 years. He denies resting leg pain or pain with ambulation. Of note He was prescribed doxycycline at the end of last month and has finished this course for possible skin infection to the wound site. 02/06/2021; I am seeing this patient who was admitted to the clinic last week by [name] He has predominantly I think a venous insufficiency wound on the right medial lower leg he has been using Santyl Hydrofera Blue under 3 layer compression.  Arterial studies were ordered last week but do not seem to been put through. He is not a diabetic. He did have venous reflux studies done in August 2020. He did have abnormal reflux time was noted in the great saphenous vein in the distal thigh and the great saphenous vein at the knee. There was no evidence of DVT or SVT at the time he was not felt to have large enough saphenous veins on either side for intervention. Compression stockings at least knee-high were recommended. Follow-up was on a as needed basis with Dr. Richardo Hanks, [place] [name] ([id number]) [id number] Page 3 of 11 The patient does not describe claudication but his pulses in his feet are not vibrant this could be because of the swelling 5/26; patient presents for 1 week follow-up. He has been using Hydrofera Blue under 3 layer compression. He had arterial studies done. He has no issues or complaints today. He denies signs of infection. He has his juxta light compressions with him today. 6/3; patient presents for 1 week follow-up. He has been using Hydrofera Blue under 3 layer compression. He denies signs and symptoms of infection. He did have bright green drainage which he states he has not seen before when the dressing was taken off today. He is using his juxta light compression to the left leg. 6/10; patient presents for 1 week follow-up. He has been tolerating Hydrofera Blue with 3 layer compression. He again reports bright green drainage. However he denies any signs of infection. He has been using juxta light compression to the left leg. 6/24; patient presents for 2-week follow-up. He has been tolerating Hydrofera Blue with 3 layer compression. Today he is healed. He brought his juxta lite compression for the right leg and continues to wear the juxta light compression on the left leg READMISSION 05/05/2022 The patient returns to clinic today with a new wound on his left posterior leg. He has been wearing his  juxta lite stockings on a regular basis, but on exam he still has 3+ pitting edema. I suspect he has lymphedema in addition to his known venous reflux. The wounds are scattered geographic wounds with light slough accumulation. They have been applying mupirocin and CeraVe ointment. He does have a VP shunt placement scheduled for next week, but it sounds like his neurosurgeon  is not aware of the fact that he has an open wound. 05/11/2022: His VP shunt placement has been postponed while we get his wound to heal. The wounds are all smaller today with just a little eschar and minimal slough. Edema control is good. No concern for infection. 05/18/2022: All of the open areas have contracted and there is good perimeter epithelialization. There is a little bit of eschar and slough on the open areas. Good edema control. No concern for infection. 05/26/2022: Many of the small open areas have closed. There is Hydrofera Blue sponge stuck in a number of places and this is unable to be removed by the intake nurse. There is a fair amount of eschar and a little bit of slough present. 06/02/2022: Continued closure of small open skin sites. There are just a couple remaining and these have a little eschar on the surface. Edema control is excellent. 06/09/2022: We are down to just 1 small open area. There is some eschar and slough present. No concern for infection. Edema control is excellent. 06/16/2022: His wound is healed. READMISSION 08/11/2022 Mr. Short underwent a successful VP shunt placement. He returns to clinic today because of a suspicious lesion on his left posterior calf. He actually has no open wounds and his edema control is excellent. He is wearing his juxta lite stockings religiously. On his left posterior calf, there is a raised scaly lesion concerning for potential squamous cell carcinoma. 08/25/2022: The biopsy that I took at his last visit was consistent with a well-differentiated squamous cell carcinoma.  He has an appointment at the skin surgery center on December 14. The wound from the biopsy has some slough accumulation, but no concern for infection. 11/06/2022: Since his last visit here, he has undergone Mohs surgery for the squamous cell carcinoma that I biopsied. He has a fairly substantial and deep defect. Apparently the procedure took place between 10 to 12 days ago and he has been in an [employer] ever since. When his Unna boot was removed, he was found to have crusting on his anterior tibial surface with small superficial wounds underneath. 11/16/2022: The anterior tibial surface wounds seem to largely be closed with just a couple of tiny open areas under some eschar. The Mohs defect is quite a bit cleaner, but still with nonviable subcutaneous tissue present along with slough and eschar, much of which is dried up Iodoflex. 11/24/2022: The anterior tibial wounds are closed. The Mohs defect is cleaner again this week with much less nonviable tissue. 12/11/2022: The Mohs defect continues to contract. There is good granulation tissue underneath a layer of dried up Iodoflex and some slough. 12/21/2022: The wound is smaller again this week, as well as shallower. There is a little bit of slough on the surface. 12/29/2022: His right leg has become massively swollen and he has had skin breakdown occur as a result with a lot of serous drainage. He has not been particularly compliant with compression garment wear, nor has he been elevating his legs. The wound on his left leg is smaller and edema control on the side is very good. Electronic Signature(s) Signed: 12/29/2022 2:39:58 PM By: [name] Guess MD FACS Entered By: [name] on 12/29/2022 14:39:58 -------------------------------------------------------------------------------- Physical Exam Details Patient Name: Date of Service: [name] 12/29/2022 2:00 PM Medical Record Number: [id number] Patient Account Number: [id number] Date of  Birth/Sex: Treating RN: [birth year] (87 y.o. M) Primary Care Provider: Willow [name] Other Clinician: Referring Provider: Treating Provider/Extender: Margaret Pyle,  Jose Weeks in Treatment: 20 Constitutional Hypertensive, asymptomatic. . . . no acute distress. Respiratory [place], ARROYAVE [name] ([id number]) [id number] Page 4 of 11 Normal work of breathing on room air. Notes 12/29/2022: His right leg has become massively swollen and he has had skin breakdown occur as a result with a lot of serous drainage. He has not been particularly compliant with compression garment wear, nor has he been elevating his legs. The wound on his left leg is smaller and edema control on the side is very good. Electronic Signature(s) Signed: 12/29/2022 2:40:36 PM By: [name] Guess MD FACS Entered By: [name] on 12/29/2022 14:40:36 -------------------------------------------------------------------------------- Physician Orders Details Patient Name: Date of Service: [name] 12/29/2022 2:00 PM Medical Record Number: [id number] Patient Account Number: [id number] Date of Birth/Sex: Treating RN: 21-Jan-1936 (87 y.o. [name] Primary Care Provider: Willow [name] Other Clinician: Referring Provider: Treating Provider/Extender: [name] in Treatment: 20 Verbal / Phone Orders: No Diagnosis Coding ICD-10 Coding Code Description [phone] Non-pressure chronic ulcer of left calf with fat layer exposed L97.811 Non-pressure chronic ulcer of other part of right lower leg limited to breakdown of skin L98.9 Disorder of the skin and subcutaneous tissue, unspecified I87.2 Venous insufficiency (chronic) (peripheral) I10 Essential (primary) hypertension Follow-up Appointments ppointment in 1 week. - [name] - room 2 Return A Anesthetic (In clinic) Topical Lidocaine 4% applied to wound bed Bathing/ Shower/ Hygiene May shower with protection but  do not get wound dressing(s) wet. Protect dressing(s) with water repellant cover (for example, large plastic bag) or a cast cover and may then take shower. Edema Control - Lymphedema / SCD / Other Elevate legs to the level of the heart or above for 30 minutes daily and/or when sitting for 3-4 times a day throughout the day. Avoid standing for long periods of time. Exercise regularly - including ankle circles while sitting Moisturize legs daily. - both legs nightly after removing juxtalites Compression stocking or Garment 20-30 mm/Hg pressure to: - both legs daily Wound Treatment Wound #3 - Lower Leg Wound Laterality: Left, Posterior Cleanser: Soap and Water 1 x Per Day/30 Days Discharge Instructions: May shower and wash wound with dial antibacterial soap and water prior to dressing change. Cleanser: Wound Cleanser 1 x Per Day/30 Days Discharge Instructions: Cleanse the wound with wound cleanser prior to applying a clean dressing using gauze sponges, not tissue or cotton balls. Peri-Wound Care: Sween Lotion (Moisturizing lotion) 1 x Per Day/30 Days Discharge Instructions: Apply moisturizing lotion as directed Prim Dressing: Promogran Prisma Matrix, 4.34 (sq in) (silver collagen) 1 x Per Day/30 Days ary Discharge Instructions: Moisten collagen with saline or hydrogel Secondary Dressing: ABD Pad, 5x9 1 x Per Day/30 Days Discharge Instructions: Apply over primary dressing as directed. Secondary Dressing: Woven Gauze Sponge, Non-Sterile 4x4 in 1 x Per Day/30 Days Discharge Instructions: Apply over primary dressing as directed. Compression Wrap: Urgo K2 Lite, two layer compression system, regular 1 x Per Day/30 Days Discharge Instructions: Apply Urgo K2 Lite as directed (alternative to 3 layer compression). JARYN, ROSKO [name] ([id number]) [id number] Page 5 of 11 Wound #5 - Lower Leg Wound Laterality: Right, Medial Cleanser: Soap and Water 1 x Per Day/30 Days Discharge  Instructions: May shower and wash wound with dial antibacterial soap and water prior to dressing change. Cleanser: Wound Cleanser 1 x Per Day/30 Days Discharge Instructions: Cleanse the wound with wound cleanser prior to applying a clean dressing using gauze sponges, not tissue or cotton balls. Peri-Wound  Care: Sween Lotion (Moisturizing lotion) 1 x Per Day/30 Days Discharge Instructions: Apply moisturizing lotion as directed Prim Dressing: Maxorb Extra Ag+ Alginate Dressing, 4x4.75 (in/in) 1 x Per Day/30 Days ary Discharge Instructions: Apply to wound bed as instructed Secondary Dressing: ABD Pad, 5x9 1 x Per Day/30 Days Discharge Instructions: Apply over primary dressing as directed. Secondary Dressing: Woven Gauze Sponge, Non-Sterile 4x4 in 1 x Per Day/30 Days Discharge Instructions: Apply over primary dressing as directed. Compression Wrap: Urgo K2 Lite, two layer compression system, regular 1 x Per Day/30 Days Discharge Instructions: Apply Urgo K2 Lite as directed (alternative to 3 layer compression). Patient Medications llergies: No Known Allergies A Notifications Medication Indication Start End 12/29/2022 lidocaine DOSE topical 4 % cream - cream topical Electronic Signature(s) Signed: 12/29/2022 3:09:48 PM By: [name] Guess MD FACS Entered By: [name] on 12/29/2022 14:40:47 -------------------------------------------------------------------------------- Problem List Details Patient Name: Date of Service: [name] 12/29/2022 2:00 PM Medical Record Number: [id number] Patient Account Number: [id number] Date of Birth/Sex: Treating RN: Jun 30, 1936 (87 y.o. M) Primary Care Provider: Willow [name] Other Clinician: Referring Provider: Treating Provider/Extender: Elsie Saas Weeks in Treatment: 20 Active Problems ICD-10 Encounter Code Description Active Date MDM Diagnosis L97.222 Non-pressure chronic ulcer of left calf with fat layer exposed 11/06/2022 No  Yes L97.811 Non-pressure chronic ulcer of other part of right lower leg limited to breakdown 12/29/2022 No Yes of skin L98.9 Disorder of the skin and subcutaneous tissue, unspecified 08/11/2022 No Yes I87.2 Venous insufficiency (chronic) (peripheral) 08/11/2022 No Yes I10 Essential (primary) hypertension 08/11/2022 No Yes [name], [name] ([id number]) [phone].pdf Page 6 of 11 Inactive Problems Resolved Problems ICD-10 Code Description Active Date Resolved Date L97.821 Non-pressure chronic ulcer of other part of left lower leg limited to breakdown of skin 11/06/2022 11/06/2022 Electronic Signature(s) Signed: 12/29/2022 2:32:01 PM By: [name] Guess MD FACS Entered By: [name] on 12/29/2022 14:32:00 -------------------------------------------------------------------------------- Progress Note Details Patient Name: Date of Service: [name] 12/29/2022 2:00 PM Medical Record Number: [id number] Patient Account Number: [id number] Date of Birth/Sex: Treating RN: 05-07-1936 (87 y.o. M) Primary Care Provider: Willow [name] Other Clinician: Referring Provider: Treating Provider/Extender: [name] in Treatment: 20 Subjective Chief Complaint Information obtained from Patient Left lower extremity wound History of Present Illness (HPI) [name] is an 87 year old male with a past medical history of CAD s/p DES to LCx 2006, carotid artery disease, hypertension, hyperlipidemia, paroxysmal atrial fibrillation on Eliquis and Cardizem that presents today for right lower extremity wound. He was seen by [name], his primary care provider on 4/29 for this issue and was referred to our clinic. Patient states that he had a small wound that spontaneously started in October 2021 and has not healed. He states this has progressively gotten larger. He has been using acne cream prescribed by the dermatologist for this issue. He reports drainage to the  wound but no purulent drainage. He reports mild soreness to the wound. He has swelling in his right leg greater than left and reports this is a chronic issue for the past 1 to 2 years. He denies resting leg pain or pain with ambulation. Of note He was prescribed doxycycline at the end of last month and has finished this course for possible skin infection to the wound site. 02/06/2021; I am seeing this patient who was admitted to the clinic last week by [name] He has predominantly I think a venous insufficiency wound on the right medial lower  leg he has been using Santyl Hydrofera Blue under 3 layer compression. Arterial studies were ordered last week but do not seem to been put through. He is not a diabetic. He did have venous reflux studies done in August 2020. He did have abnormal reflux time was noted in the great saphenous vein in the distal thigh and the great saphenous vein at the knee. There was no evidence of DVT or SVT at the time he was not felt to have large enough saphenous veins on either side for intervention. Compression stockings at least knee-high were recommended. Follow-up was on a as needed basis with Dr. Randie Heinzain The patient does not describe claudication but his pulses in his feet are not vibrant this could be because of the swelling 5/26; patient presents for 1 week follow-up. He has been using Hydrofera Blue under 3 layer compression. He had arterial studies done. He has no issues or complaints today. He denies signs of infection. He has his juxta light compressions with him today. 6/3; patient presents for 1 week follow-up. He has been using Hydrofera Blue under 3 layer compression. He denies signs and symptoms of infection. He did have bright green drainage which he states he has not seen before when the dressing was taken off today. He is using his juxta light compression to the left leg. 6/10; patient presents for 1 week follow-up. He has been tolerating Hydrofera Blue  with 3 layer compression. He again reports bright green drainage. However he denies any signs of infection. He has been using juxta light compression to the left leg. 6/24; patient presents for 2-week follow-up. He has been tolerating Hydrofera Blue with 3 layer compression. Today he is healed. He brought his juxta lite compression for the right leg and continues to wear the juxta light compression on the left leg READMISSION 05/05/2022 The patient returns to clinic today with a new wound on his left posterior leg. He has been wearing his juxta lite stockings on a regular basis, but on exam he still has 3+ pitting edema. I suspect he has lymphedema in addition to his known venous reflux. The wounds are scattered geographic wounds with light slough accumulation. They have been applying mupirocin and CeraVe ointment. He does have a VP shunt placement scheduled for next week, but it sounds like his neurosurgeon is not aware of the fact that he has an open wound. 05/11/2022: His VP shunt placement has been postponed while we get his wound to heal. The wounds are all smaller today with just a little eschar and minimal slough. Edema control is good. No concern for infection. 05/18/2022: All of the open areas have contracted and there is good perimeter epithelialization. There is a little bit of eschar and slough on the open areas. Good edema control. No concern for infection. 05/26/2022: Many of the small open areas have closed. There is Hydrofera Blue sponge stuck in a number of places and this is unable to be removed by the intake nurse. There is a fair amount of eschar and a little bit of slough present. 06/02/2022: Continued closure of small open skin sites. There are just a couple remaining and these have a little eschar on the surface. Edema control is excellent. [name], [name] ([id number]) [id number] Page 7 of 11 06/09/2022: We are down to just 1 small open area. There is  some eschar and slough present. No concern for infection. Edema control is excellent. 06/16/2022: His wound is healed. READMISSION 08/11/2022 Mr. Short underwent a  successful VP shunt placement. He returns to clinic today because of a suspicious lesion on his left posterior calf. He actually has no open wounds and his edema control is excellent. He is wearing his juxta lite stockings religiously. On his left posterior calf, there is a raised scaly lesion concerning for potential squamous cell carcinoma. 08/25/2022: The biopsy that I took at his last visit was consistent with a well-differentiated squamous cell carcinoma. He has an appointment at the skin surgery center on December 14. The wound from the biopsy has some slough accumulation, but no concern for infection. 11/06/2022: Since his last visit here, he has undergone Mohs surgery for the squamous cell carcinoma that I biopsied. He has a fairly substantial and deep defect. Apparently the procedure took place between 10 to 12 days ago and he has been in an [employer] ever since. When his Unna boot was removed, he was found to have crusting on his anterior tibial surface with small superficial wounds underneath. 11/16/2022: The anterior tibial surface wounds seem to largely be closed with just a couple of tiny open areas under some eschar. The Mohs defect is quite a bit cleaner, but still with nonviable subcutaneous tissue present along with slough and eschar, much of which is dried up Iodoflex. 11/24/2022: The anterior tibial wounds are closed. The Mohs defect is cleaner again this week with much less nonviable tissue. 12/11/2022: The Mohs defect continues to contract. There is good granulation tissue underneath a layer of dried up Iodoflex and some slough. 12/21/2022: The wound is smaller again this week, as well as shallower. There is a little bit of slough on the surface. 12/29/2022: His right leg has become massively swollen and he has had skin  breakdown occur as a result with a lot of serous drainage. He has not been particularly compliant with compression garment wear, nor has he been elevating his legs. The wound on his left leg is smaller and edema control on the side is very good. Patient History Information obtained from Patient. Family History Cancer - Father, Diabetes - Maternal Grandparents, Heart Disease - Maternal Grandparents, Hypertension - Maternal Grandparents, No family history of Hereditary Spherocytosis, Kidney Disease, Lung Disease, Seizures, Stroke, Thyroid Problems, Tuberculosis. Social History Former smoker - Quit over 30 years ago, Marital Status - Married, Alcohol Use - Rarely, Drug Use - No History, Caffeine Use - Daily. Medical History Eyes Patient has history of Glaucoma Cardiovascular Patient has history of Arrhythmia - afib, Coronary Artery Disease, Hypertension, Peripheral Venous Disease Endocrine Denies history of Type I Diabetes, Type II Diabetes Genitourinary Denies history of End Stage Renal Disease Integumentary (Skin) Denies history of History of Burn Musculoskeletal Patient has history of Osteoarthritis Oncologic Denies history of Received Chemotherapy, Received Radiation Psychiatric Denies history of Anorexia/bulimia, Confinement Anxiety Hospitalization/Surgery History - lumbar laminectomy. - total knee arthropathy right. - bil total hip arthroplasty. - coronary stents. - toe surgery. - tonsillectomy. - shunt insertion. Medical A Surgical History Notes nd Gastrointestinal colon polyps Endocrine Hypothyroidism Genitourinary BPH Musculoskeletal Bilat Hip Replacements, Right Knee Replacement, lumbar stenosis Neurologic hydrocephalus Oncologic Skin Cancer multiple sites Objective Constitutional [name], [name] ([id number]) [phone].pdf Page 8 of 11 Hypertensive, asymptomatic. no acute distress. Vitals Time Taken: 2:02 PM, Temperature: 97.8 F, Pulse:  73 bpm, Respiratory Rate: 20 breaths/min, Blood Pressure: 160/82 mmHg. Respiratory Normal work of breathing on room air. General Notes: 12/29/2022: His right leg has become massively swollen and he has had skin breakdown occur as a result with a lot  of serous drainage. He has not been particularly compliant with compression garment wear, nor has he been elevating his legs. The wound on his left leg is smaller and edema control on the side is very good. Integumentary (Hair, Skin) Wound #3 status is Open. Original cause of wound was Puncture. The date acquired was: 08/12/2022. The wound has been in treatment 18 weeks. The wound is located on the Left,Posterior Lower Leg. The wound measures 1.5cm length x 0.9cm width x 0.1cm depth; 1.06cm^2 area and 0.106cm^3 volume. There is Fat Layer (Subcutaneous Tissue) exposed. There is no tunneling or undermining noted. There is a medium amount of serosanguineous drainage noted. The wound margin is distinct with the outline attached to the wound base. There is large (67-100%) red granulation within the wound bed. There is a small (1-33%) amount of necrotic tissue within the wound bed including Adherent Slough. The periwound skin appearance had no abnormalities noted for texture. The periwound skin appearance had no abnormalities noted for moisture. The periwound skin appearance exhibited: Hemosiderin Staining. Periwound temperature was noted as No Abnormality. Wound #5 status is Open. Original cause of wound was Gradually Appeared. The date acquired was: 12/29/2022. The wound is located on the Right,Medial Lower Leg. The wound measures 8.5cm length x 12cm width x 0.1cm depth; 80.111cm^2 area and 8.011cm^3 volume. There is Fat Layer (Subcutaneous Tissue) exposed. There is no tunneling or undermining noted. There is a medium amount of serous drainage noted. The wound margin is distinct with the outline attached to the wound base. There is large (67-100%) red, pink  granulation within the wound bed. There is a small (1-33%) amount of necrotic tissue within the wound bed including Eschar and Adherent Slough. The periwound skin appearance had no abnormalities noted for texture. The periwound skin appearance exhibited: Dry/Scaly. The periwound skin appearance did not exhibit: Hemosiderin Staining. Periwound temperature was noted as No Abnormality. 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 There was a Excisional Skin/Subcutaneous Tissue Debridement with a total area of 1.35 sq cm performed by [name] Guess, MD. With the following instrument(s): Curette to remove Non-Viable tissue/material. Material removed includes Eschar, Subcutaneous Tissue, and Slough after achieving pain control using Lidocaine 4% T opical Solution. No specimens were taken. A time out was conducted at 14:19, prior to the start of the procedure. A Minimum amount of bleeding was controlled with Pressure. The procedure was tolerated well. Post Debridement Measurements: 1.5cm length x 0.9cm width x 0.1cm depth; 0.106cm^3 volume. Character of Wound/Ulcer Post Debridement is improved. Post procedure Diagnosis Wound #3: Same as Pre-Procedure General Notes: scribed for [name] by Samuella Bruin, RN. Pre-procedure diagnosis of Wound #3 is a Lesion located on the Left,Posterior Lower Leg . There was a Double Layer Compression Therapy Procedure by Samuella Bruin, RN. Post procedure Diagnosis Wound #3: Same as Pre-Procedure Wound #5 Pre-procedure diagnosis of Wound #5 is a Lymphedema located on the  Right,Medial Lower Leg . There was a Selective/Open Wound Non-Viable Tissue Debridement with a total area of 20 sq cm performed by [name] Guess, MD. With the following instrument(s): Curette to remove Non-Viable tissue/material. Material removed includes Eschar and Slough and after achieving pain control using Lidocaine  4% T opical Solution. No specimens were taken. A time out was conducted at 14:19, prior to the start of the procedure. A Minimum amount of bleeding was controlled with Pressure. The procedure was tolerated well. Post Debridement Measurements: 8.5cm length x 12cm width x 0.1cm depth; 8.011cm^3 volume. Character of Wound/Ulcer Post Debridement is improved. Post procedure Diagnosis Wound #5: Same as Pre-Procedure General Notes: scribed for [name] by Samuella Bruin, RN. Pre-procedure diagnosis of Wound #5 is a Lymphedema located on the Right,Medial Lower Leg . There was a Double Layer Compression Therapy Procedure by Samuella Bruin, RN. Post procedure Diagnosis Wound #5: Same as Pre-Procedure Plan Follow-up Appointments: Return Appointment in 1 week. - [name] - room 2 [place], Juda [name] ([id number]) [id number] Page 9 of 11 Anesthetic: (In clinic) Topical Lidocaine 4% applied to wound bed Bathing/ Shower/ Hygiene: May shower with protection but do not get wound dressing(s) wet. Protect dressing(s) with water repellant cover (for example, large plastic bag) or a cast cover and may then take shower. Edema Control - Lymphedema / SCD / Other: Elevate legs to the level of the heart or above for 30 minutes daily and/or when sitting for 3-4 times a day throughout the day. Avoid standing for long periods of time. Exercise regularly - including ankle circles while sitting Moisturize legs daily. - both legs nightly after removing juxtalites Compression stocking or Garment 20-30 mm/Hg pressure to: - both legs daily The following medication(s) was  prescribed: lidocaine topical 4 % cream cream topical was prescribed at facility WOUND #3: - Lower Leg Wound Laterality: Left, Posterior Cleanser: Soap and Water 1 x Per Day/30 Days Discharge Instructions: May shower and wash wound with dial antibacterial soap and water prior to dressing change. Cleanser: Wound Cleanser 1 x Per Day/30 Days Discharge Instructions: Cleanse the wound with wound cleanser prior to applying a clean dressing using gauze sponges, not tissue or cotton balls. Peri-Wound Care: Sween Lotion (Moisturizing lotion) 1 x Per Day/30 Days Discharge Instructions: Apply moisturizing lotion as directed Prim Dressing: Promogran Prisma Matrix, 4.34 (sq in) (silver collagen) 1 x Per Day/30 Days ary Discharge Instructions: Moisten collagen with saline or hydrogel Secondary Dressing: ABD Pad, 5x9 1 x Per Day/30 Days Discharge Instructions: Apply over primary dressing as directed. Secondary Dressing: Woven Gauze Sponge, Non-Sterile 4x4 in 1 x Per Day/30 Days Discharge Instructions: Apply over primary dressing as directed. Com pression Wrap: Urgo K2 Lite, two layer compression system, regular 1 x Per Day/30 Days Discharge Instructions: Apply Urgo K2 Lite as directed (alternative to 3 layer compression). WOUND #5: - Lower Leg Wound Laterality: Right, Medial Cleanser: Soap and Water 1 x Per Day/30 Days Discharge Instructions: May shower and wash wound with dial antibacterial soap and water prior to dressing change. Cleanser: Wound Cleanser 1 x Per Day/30 Days Discharge Instructions: Cleanse the wound with wound cleanser prior to applying a clean dressing using gauze sponges, not tissue or cotton balls. Peri-Wound Care: Sween Lotion (Moisturizing lotion) 1 x Per Day/30 Days Discharge Instructions: Apply moisturizing lotion as directed Prim Dressing: Maxorb Extra Ag+ Alginate Dressing, 4x4.75 (in/in) 1 x Per Day/30 Days ary Discharge Instructions: Apply to wound bed as  instructed Secondary Dressing: ABD Pad, 5x9 1 x Per Day/30 Days Discharge Instructions: Apply over primary dressing as directed. Secondary Dressing: Woven Gauze Sponge, Non-Sterile 4x4 in 1 x Per Day/30 Days Discharge Instructions: Apply over primary dressing as directed. Com pression Wrap: Urgo K2 Lite, two layer compression system, regular 1 x Per Day/30 Days  Discharge Instructions: Apply Urgo K2 Lite as directed (alternative to 3 layer compression). 12/29/2022: His right leg has become massively swollen and he has had skin breakdown occur as a result with a lot of serous drainage. He has not been particularly compliant with compression garment wear, nor has he been elevating his legs. The wound on his left leg is smaller and edema control on the side is very good. I used a curette to debride slough and eschar from the right leg and slough and subcutaneous tissue from the left leg. We will continue Prisma silver collagen on the left and will use silver alginate on the right. Bilateral 3 layer compression. I reinforced with him the importance of keeping his legs elevated is much as possible. Follow-up in 1 week. Electronic Signature(s) Signed: 12/29/2022 2:41:31 PM By: [name] Guess MD FACS Entered By: [name] on 12/29/2022 14:41:31 -------------------------------------------------------------------------------- HxROS Details Patient Name: Date of Service: [name] 12/29/2022 2:00 PM Medical Record Number: [id number] Patient Account Number: [id number] Date of Birth/Sex: Treating RN: 01-11-1936 (87 y.o. M) Primary Care Provider: Willow [name] Other Clinician: Referring Provider: Treating Provider/Extender: [name] in Treatment: 20 Information Obtained From Patient Eyes Medical History: Positive for: Glaucoma Cardiovascular Medical History: Positive for: Arrhythmia - afib; Coronary Artery Disease; Hypertension; Peripheral Venous Disease DAT, BJORKMAN [name] ([id number]) [phone].pdf Page 10 of 11 Gastrointestinal Medical History: Past Medical History Notes: colon polyps Endocrine Medical History: Negative for: Type I Diabetes; Type II Diabetes Past Medical History Notes: Hypothyroidism Genitourinary Medical History: Negative for: End Stage Renal Disease Past Medical History Notes: BPH Integumentary (Skin) Medical History: Negative for: History of Burn Musculoskeletal Medical History: Positive for: Osteoarthritis Past Medical History Notes: Bilat Hip Replacements, Right Knee Replacement, lumbar stenosis Neurologic Medical History: Past Medical History Notes: hydrocephalus Oncologic Medical History: Negative for: Received Chemotherapy; Received Radiation Past Medical History Notes: Skin Cancer multiple sites Psychiatric Medical History: Negative for: Anorexia/bulimia; Confinement Anxiety HBO Extended History Items Eyes: Glaucoma Immunizations Pneumococcal Vaccine: Received Pneumococcal Vaccination: Yes Received Pneumococcal Vaccination On or After 60th Birthday: Yes Implantable Devices None Hospitalization / Surgery History Type of Hospitalization/Surgery lumbar laminectomy total knee arthropathy right bil total hip arthroplasty coronary stents toe surgery tonsillectomy shunt insertion Family and Social History Cancer: Yes - Father; Diabetes: Yes - Maternal Grandparents; Heart Disease: Yes - Maternal Grandparents; Hereditary Spherocytosis: No; Hypertension: Yes - Maternal Grandparents; Kidney Disease: No; Lung Disease: No; Seizures: No; Stroke: No; Thyroid Problems: No; Tuberculosis: No; Former smoker - Quit over 30 years ago; Marital Status - Married; Alcohol Use: Rarely; Drug Use: No History; Caffeine Use: Daily; Financial Concerns: No; Food, Clothing or [name], [name] ([id number]) [id number] Page 11 of 11 Shelter Needs: No; Support System Lacking:  No; Transportation Concerns: No Psychologist, prison and probation services) Signed: 12/29/2022 3:09:48 PM By: [name] Guess MD FACS Entered By: [name] on 12/29/2022 14:40:04 -------------------------------------------------------------------------------- SuperBill Details Patient Name: Date of Service: [name] 12/29/2022 Medical Record Number: [id number] Patient Account Number: [id number] Date of Birth/Sex: Treating RN: 10-16-1935 (87 y.o. M) Primary Care Provider: Willow [name] Other Clinician: Referring Provider: Treating Provider/Extender: Elsie Saas Weeks in Treatment: 20 Diagnosis Coding ICD-10 Codes Code Description [phone] Non-pressure chronic ulcer of left calf with fat layer exposed L97.811 Non-pressure chronic ulcer of other part of right lower leg limited to breakdown of skin L98.9 Disorder of the skin and subcutaneous tissue, unspecified I87.2 Venous insufficiency (chronic) (peripheral) I10 Essential (primary) hypertension Facility Procedures : CPT4 Code:  [id number] Description: 11042 - DEB SUBQ TISSUE 20 SQ CM/< ICD-10 Diagnosis Description L97.222 Non-pressure chronic ulcer of left calf with fat layer exposed Modifier: Quantity: 1 : CPT4 Code: [id number] Description: 97597 - DEBRIDE WOUND 1ST 20 SQ CM OR < ICD-10 Diagnosis Description L97.811 Non-pressure chronic ulcer of other part of right lower leg limited to breakdown Modifier: of skin Quantity: 1 Physician Procedures : CPT4 Code Description Modifier [id number] 99214 - WC PHYS LEVEL 4 - EST PT 25 ICD-10 Diagnosis Description L97.222 Non-pressure chronic ulcer of left calf with fat layer exposed L97.811 Non-pressure chronic ulcer of other part of right lower leg limited  to breakdown of skin L98.9 Disorder of the skin and subcutaneous tissue, unspecified I87.2 Venous insufficiency (chronic) (peripheral) Quantity: 1 : [id number] 11042 - WC PHYS SUBQ TISS 20 SQ CM ICD-10 Diagnosis Description L97.222 Non-pressure  chronic ulcer of left calf with fat layer exposed Quantity: 1 : [id number] 97597 - WC PHYS DEBR WO ANESTH 20 SQ CM ICD-10 Diagnosis Description L97.811 Non-pressure chronic ulcer of other part of right lower leg limited to breakdown of skin Quantity: 1 Electronic Signature(s) Signed: 12/29/2022 2:41:54 PM By: [name] Guess MD FACS Entered By: [name] on 12/29/2022 14:41:53

## 2023-01-07 ENCOUNTER — Encounter (HOSPITAL_BASED_OUTPATIENT_CLINIC_OR_DEPARTMENT_OTHER): Payer: Medicare Other | Admitting: General Surgery

## 2023-01-07 DIAGNOSIS — Z79899 Other long term (current) drug therapy: Secondary | ICD-10-CM | POA: Diagnosis not present

## 2023-01-07 DIAGNOSIS — L988 Other specified disorders of the skin and subcutaneous tissue: Secondary | ICD-10-CM | POA: Diagnosis not present

## 2023-01-07 DIAGNOSIS — I480 Paroxysmal atrial fibrillation: Secondary | ICD-10-CM | POA: Diagnosis not present

## 2023-01-07 DIAGNOSIS — E785 Hyperlipidemia, unspecified: Secondary | ICD-10-CM | POA: Diagnosis not present

## 2023-01-07 DIAGNOSIS — Z7901 Long term (current) use of anticoagulants: Secondary | ICD-10-CM | POA: Diagnosis not present

## 2023-01-07 DIAGNOSIS — I10 Essential (primary) hypertension: Secondary | ICD-10-CM | POA: Diagnosis not present

## 2023-01-07 DIAGNOSIS — L97222 Non-pressure chronic ulcer of left calf with fat layer exposed: Secondary | ICD-10-CM | POA: Diagnosis not present

## 2023-01-07 DIAGNOSIS — Z955 Presence of coronary angioplasty implant and graft: Secondary | ICD-10-CM | POA: Diagnosis not present

## 2023-01-07 DIAGNOSIS — Z982 Presence of cerebrospinal fluid drainage device: Secondary | ICD-10-CM | POA: Diagnosis not present

## 2023-01-07 DIAGNOSIS — L97812 Non-pressure chronic ulcer of other part of right lower leg with fat layer exposed: Secondary | ICD-10-CM | POA: Diagnosis not present

## 2023-01-07 DIAGNOSIS — I890 Lymphedema, not elsewhere classified: Secondary | ICD-10-CM | POA: Diagnosis not present

## 2023-01-07 DIAGNOSIS — I2510 Atherosclerotic heart disease of native coronary artery without angina pectoris: Secondary | ICD-10-CM | POA: Diagnosis not present

## 2023-01-07 DIAGNOSIS — I872 Venous insufficiency (chronic) (peripheral): Secondary | ICD-10-CM | POA: Diagnosis not present

## 2023-01-08 NOTE — Progress Notes (Signed)
[name], [name] ([id number]) [id number] Page 1 of 11 Visit Report for 01/07/2023 Chief Complaint Document Details Patient Name: Date of Service: SHO [name], [name] 01/07/2023 12:30 PM Medical Record Number: [id number] Patient Account Number: [id number] Date of Birth/Sex: Treating RN: [birth year] (87 y.o. M) Primary Care Provider: Willow Ora Other Clinician: Referring Provider: Treating Provider/Extender: Cresenciano Genre in Treatment: 21 Information Obtained from: Patient Chief Complaint Left lower extremity wound Electronic Signature(s) Signed: 01/07/2023 1:58:22 PM By: Duanne Guess MD FACS Entered By: Duanne Guess on 01/07/2023 13:58:21 -------------------------------------------------------------------------------- Debridement Details Patient Name: Date of Service: SHO [name], [name] 01/07/2023 12:30 PM Medical Record Number: [id number] Patient Account Number: [id number] Date of Birth/Sex: Treating RN: 1935-10-22 (87 y.o. [name] Primary Care Provider: Willow Ora Other Clinician: Referring Provider: Treating Provider/Extender: Cresenciano Genre in Treatment: 21 Debridement Performed for Assessment: Wound #3 Left,Posterior Lower Leg Performed By: Physician Duanne Guess, MD Debridement Type: Debridement Level of Consciousness (Pre-procedure): Awake and Alert Pre-procedure Verification/Time Out Yes - 13:15 Taken: Start Time: 13:15 Pain Control: Lidocaine 4% T opical Solution T Area Debrided (L x W): otal 1.4 (cm) x 0.9 (cm) = 1.26 (cm) Tissue and other material debrided: Non-Viable, Slough, Slough Level: Non-Viable Tissue Debridement Description: Selective/Open Wound Instrument: Curette Bleeding: Minimum Hemostasis Achieved: Pressure End Time: 13:18 Procedural Pain: 0 Post Procedural Pain: 0 Response to Treatment: Procedure was tolerated well Level of Consciousness (Post- Awake and  Alert procedure): Post Debridement Measurements of Total Wound Length: (cm) 1.4 Width: (cm) 0.9 Depth: (cm) 0.1 Volume: (cm) 0.099 Character of Wound/Ulcer Post Debridement: Improved Post Procedure Diagnosis Same as Pre-procedure Notes Scribed for Dr. Lady Gary by J.Scotton Electronic Signature(s) Signed: 01/07/2023 4:05:13 PM By: Duanne Guess MD FACS Signed: 01/07/2023 5:04:32 PM By: Karie Schwalbe RN NICKOLAI, RINKS E ([id number]) [id number] Page 2 of 11 Entered By: Karie Schwalbe on 01/07/2023 13:23:23 -------------------------------------------------------------------------------- Debridement Details Patient Name: Date of Service: SHO [name], [name] 01/07/2023 12:30 PM Medical Record Number: [id number] Patient Account Number: [id number] Date of Birth/Sex: Treating RN: 1936/01/22 (87 y.o. [name] Primary Care Provider: Willow Ora Other Clinician: Referring Provider: Treating Provider/Extender: Cresenciano Genre in Treatment: 21 Debridement Performed for Assessment: Wound #5 Right,Medial Lower Leg Performed By: Physician Duanne Guess, MD Debridement Type: Debridement Level of Consciousness (Pre-procedure): Awake and Alert Pre-procedure Verification/Time Out Yes - 13:15 Taken: Start Time: 13:15 Pain Control: Lidocaine 4% T opical Solution T Area Debrided (L x W): otal 7.2 (cm) x 7.5 (cm) = 54 (cm) Tissue and other material debrided: Non-Viable, Eschar, Slough, Slough Level: Non-Viable Tissue Debridement Description: Selective/Open Wound Instrument: Curette Bleeding: Minimum Hemostasis Achieved: Pressure End Time: 13:18 Procedural Pain: 0 Post Procedural Pain: 0 Response to Treatment: Procedure was tolerated well Level of Consciousness (Post- Awake and Alert procedure): Post Debridement Measurements of Total Wound Length: (cm) 7.2 Width: (cm) 7.5 Depth: (cm) 0.1 Volume: (cm) 4.241 Character of Wound/Ulcer  Post Debridement: Improved Post Procedure Diagnosis Same as Pre-procedure Notes Scribed for Dr. Lady Gary by Baruch Merl Electronic Signature(s) Signed: 01/07/2023 4:05:13 PM By: Duanne Guess MD FACS Signed: 01/07/2023 5:04:32 PM By: Karie Schwalbe RN Entered By: Karie Schwalbe on 01/07/2023 13:27:08 -------------------------------------------------------------------------------- HPI Details Patient Name: Date of Service: SHO [name], [name] 01/07/2023 12:30 PM Medical Record Number: [id number] Patient Account Number: [id number] Date of Birth/Sex: Treating RN: 16-Mar-1936 (87 y.o. M) Primary Care Provider: Willow Ora Other Clinician: Referring Provider: Treating Provider/Extender: Elsie Saas Weeks in Treatment: [id number]  History of Present Illness HPI Description: [name] is an 87 year old male with a past medical history of CAD s/p DES to LCx 2006, carotid artery disease, hypertension, hyperlipidemia, paroxysmal atrial fibrillation on Eliquis and Cardizem that presents today for right lower extremity wound. He was seen by Dr. Drue Novel, his primary care provider on 4/29 for this issue and was referred to our clinic. Patient states that he had a small wound that spontaneously started in October 2021 and has not healed. He states this has progressively gotten larger. He has been using acne cream prescribed by the dermatologist for this issue. He reports drainage to the wound but no purulent drainage. He reports mild soreness to the wound. He has swelling in his right leg greater than left and reports this is a chronic issue for the past 1 to 2 years. He denies resting leg pain or pain with ambulation. Of note He was prescribed doxycycline at the end of last month and has finished this course for possible skin infection to the wound site. DOV, DILL ([id number]) [id number] Page 3 of 11 02/06/2021; I am seeing this patient who was admitted to the clinic last  week by Dr. Mikey Bussing. He has predominantly I think a venous insufficiency wound on the right medial lower leg he has been using Santyl Hydrofera Blue under 3 layer compression. Arterial studies were ordered last week but do not seem to been put through. He is not a diabetic. He did have venous reflux studies done in August 2020. He did have abnormal reflux time was noted in the great saphenous vein in the distal thigh and the great saphenous vein at the knee. There was no evidence of DVT or SVT at the time he was not felt to have large enough saphenous veins on either side for intervention. Compression stockings at least knee-high were recommended. Follow-up was on a as needed basis with Dr. Randie Heinz The patient does not describe claudication but his pulses in his feet are not vibrant this could be because of the swelling 5/26; patient presents for 1 week follow-up. He has been using Hydrofera Blue under 3 layer compression. He had arterial studies done. He has no issues or complaints today. He denies signs of infection. He has his juxta light compressions with him today. 6/3; patient presents for 1 week follow-up. He has been using Hydrofera Blue under 3 layer compression. He denies signs and symptoms of infection. He did have bright green drainage which he states he has not seen before when the dressing was taken off today. He is using his juxta light compression to the left leg. 6/10; patient presents for 1 week follow-up. He has been tolerating Hydrofera Blue with 3 layer compression. He again reports bright green drainage. However he denies any signs of infection. He has been using juxta light compression to the left leg. 6/24; patient presents for 2-week follow-up. He has been tolerating Hydrofera Blue with 3 layer compression. Today he is healed. He brought his juxta lite compression for the right leg and continues to wear the juxta light compression on the left leg READMISSION 05/05/2022 The  patient returns to clinic today with a new wound on his left posterior leg. He has been wearing his juxta lite stockings on a regular basis, but on exam he still has 3+ pitting edema. I suspect he has lymphedema in addition to his known venous reflux. The wounds are scattered geographic wounds with light slough accumulation. They have been applying mupirocin and CeraVe  ointment. He does have a VP shunt placement scheduled for next week, but it sounds like his neurosurgeon is not aware of the fact that he has an open wound. 05/11/2022: His VP shunt placement has been postponed while we get his wound to heal. The wounds are all smaller today with just a little eschar and minimal slough. Edema control is good. No concern for infection. 05/18/2022: All of the open areas have contracted and there is good perimeter epithelialization. There is a little bit of eschar and slough on the open areas. Good edema control. No concern for infection. 05/26/2022: Many of the small open areas have closed. There is Hydrofera Blue sponge stuck in a number of places and this is unable to be removed by the intake nurse. There is a fair amount of eschar and a little bit of slough present. 06/02/2022: Continued closure of small open skin sites. There are just a couple remaining and these have a little eschar on the surface. Edema control is excellent. 06/09/2022: We are down to just 1 small open area. There is some eschar and slough present. No concern for infection. Edema control is excellent. 06/16/2022: His wound is healed. READMISSION 08/11/2022 Mr. Short underwent a successful VP shunt placement. He returns to clinic today because of a suspicious lesion on his left posterior calf. He actually has no open wounds and his edema control is excellent. He is wearing his juxta lite stockings religiously. On his left posterior calf, there is a raised scaly lesion concerning for potential squamous cell carcinoma. 08/25/2022: The  biopsy that I took at his last visit was consistent with a well-differentiated squamous cell carcinoma. He has an appointment at the skin surgery center on December 14. The wound from the biopsy has some slough accumulation, but no concern for infection. 11/06/2022: Since his last visit here, he has undergone Mohs surgery for the squamous cell carcinoma that I biopsied. He has a fairly substantial and deep defect. Apparently the procedure took place between 10 to 12 days ago and he has been in an [employer] ever since. When his Unna boot was removed, he was found to have crusting on his anterior tibial surface with small superficial wounds underneath. 11/16/2022: The anterior tibial surface wounds seem to largely be closed with just a couple of tiny open areas under some eschar. The Mohs defect is quite a bit cleaner, but still with nonviable subcutaneous tissue present along with slough and eschar, much of which is dried up Iodoflex. 11/24/2022: The anterior tibial wounds are closed. The Mohs defect is cleaner again this week with much less nonviable tissue. 12/11/2022: The Mohs defect continues to contract. There is good granulation tissue underneath a layer of dried up Iodoflex and some slough. 12/21/2022: The wound is smaller again this week, as well as shallower. There is a little bit of slough on the surface. 12/29/2022: His right leg has become massively swollen and he has had skin breakdown occur as a result with a lot of serous drainage. He has not been particularly compliant with compression garment wear, nor has he been elevating his legs. The wound on his left leg is smaller and edema control on the side is very good. 01/07/2023: He has built up some slough and eschar on the right leg, but edema control has improved markedly. The wound on his left leg is much smaller again today with just a little slough on the surface. Electronic Signature(s) Signed: 01/07/2023 2:05:58 PM By: Duanne Guess MD  FACS Entered By: Duanne Guess on 01/07/2023 14:05:58 -------------------------------------------------------------------------------- Physical Exam Details Patient Name: Date of Service: SHO [name], [name] 01/07/2023 12:30 PM Medical Record Number: [id number] Patient Account Number: [id number] Date of Birth/Sex: Treating RN: 12-02-1935 (87 y.o. YUTA, CIPOLLONE ([id number]) [id number] Page 4 of 11 Primary Care Provider: Willow Ora Other Clinician: Referring Provider: Treating Provider/Extender: Cresenciano Genre in Treatment: 21 Constitutional Slightly hypertensive. . . . no acute distress. Respiratory Normal work of breathing on room air. Notes 01/07/2023: He has built up some slough and eschar on the right leg, but edema control has improved markedly. The wound on his left leg is much smaller again today with just a little slough on the surface. Electronic Signature(s) Signed: 01/07/2023 2:06:38 PM By: Duanne Guess MD FACS Entered By: Duanne Guess on 01/07/2023 14:06:38 -------------------------------------------------------------------------------- Physician Orders Details Patient Name: Date of Service: SHO [name], [name] 01/07/2023 12:30 PM Medical Record Number: [id number] Patient Account Number: [id number] Date of Birth/Sex: Treating RN: [birth year] (87 y.o. [name] Primary Care Provider: Willow Ora Other Clinician: Referring Provider: Treating Provider/Extender: Cresenciano Genre in Treatment: 21 Verbal / Phone Orders: No Diagnosis Coding ICD-10 Coding Code Description [phone] Non-pressure chronic ulcer of left calf with fat layer exposed L97.811 Non-pressure chronic ulcer of other part of right lower leg limited to breakdown of skin L98.9 Disorder of the skin and subcutaneous tissue, unspecified I87.2 Venous insufficiency (chronic) (peripheral) I10 Essential (primary) hypertension Follow-up  Appointments ppointment in 1 week. - Dr. Lady Gary - Room 2 Return A Other: - Tuesday 12/3022 at 12:30pm Room 2 Anesthetic (In clinic) Topical Lidocaine 4% applied to wound bed Bathing/ Shower/ Hygiene May shower with protection but do not get wound dressing(s) wet. Protect dressing(s) with water repellant cover (for example, large plastic bag) or a cast cover and may then take shower. Edema Control - Lymphedema / SCD / Other Elevate legs to the level of the heart or above for 30 minutes daily and/or when sitting for 3-4 times a day throughout the day. - Elevate legs throughout the day. When not walking, elevate legs. Avoid standing for long periods of time. Exercise regularly - including ankle circles while sitting Moisturize legs daily. - both legs nightly after removing juxtalites Compression stocking or Garment 20-30 mm/Hg pressure to: - both legs daily Wound Treatment Wound #3 - Lower Leg Wound Laterality: Left, Posterior Cleanser: Soap and Water 1 x Per Day/30 Days Discharge Instructions: May shower and wash wound with dial antibacterial soap and water prior to dressing change. Cleanser: Wound Cleanser 1 x Per Day/30 Days Discharge Instructions: Cleanse the wound with wound cleanser prior to applying a clean dressing using gauze sponges, not tissue or cotton balls. Peri-Wound Care: Sween Lotion (Moisturizing lotion) 1 x Per Day/30 Days Discharge Instructions: Apply moisturizing lotion as directed Prim Dressing: Promogran Prisma Matrix, 4.34 (sq in) (silver collagen) 1 x Per Day/30 Days ary Discharge Instructions: Moisten collagen with saline or hydrogel CARSEN, LEAF ([id number]) [id number] Page 5 of 11 Secondary Dressing: ABD Pad, 5x9 1 x Per Day/30 Days Discharge Instructions: Apply over primary dressing as directed. Secondary Dressing: Woven Gauze Sponge, Non-Sterile 4x4 in 1 x Per Day/30 Days Discharge Instructions: Apply over primary dressing as  directed. Compression Wrap: Urgo K2 Lite, two layer compression system, regular 1 x Per Day/30 Days Discharge Instructions: Apply Urgo K2 Lite as directed (alternative to 3 layer compression). Wound #5 - Lower Leg Wound Laterality: Right, Medial Cleanser: Soap  and Water 1 x Per Day/30 Days Discharge Instructions: May shower and wash wound with dial antibacterial soap and water prior to dressing change. Cleanser: Wound Cleanser 1 x Per Day/30 Days Discharge Instructions: Cleanse the wound with wound cleanser prior to applying a clean dressing using gauze sponges, not tissue or cotton balls. Peri-Wound Care: Sween Lotion (Moisturizing lotion) 1 x Per Day/30 Days Discharge Instructions: Apply moisturizing lotion as directed Prim Dressing: Maxorb Extra Ag+ Alginate Dressing, 4x4.75 (in/in) 1 x Per Day/30 Days ary Discharge Instructions: Apply to wound bed as instructed Secondary Dressing: ABD Pad, 5x9 1 x Per Day/30 Days Discharge Instructions: Apply over primary dressing as directed. Secondary Dressing: Woven Gauze Sponge, Non-Sterile 4x4 in 1 x Per Day/30 Days Discharge Instructions: Apply over primary dressing as directed. Compression Wrap: Urgo K2 Lite, two layer compression system, regular 1 x Per Day/30 Days Discharge Instructions: Apply Urgo K2 Lite as directed (alternative to 3 layer compression). Electronic Signature(s) Signed: 01/07/2023 4:05:13 PM By: Duanne Guess MD FACS Entered By: Duanne Guess on 01/07/2023 14:07:16 -------------------------------------------------------------------------------- Problem List Details Patient Name: Date of Service: SHO [name], [name] 01/07/2023 12:30 PM Medical Record Number: [id number] Patient Account Number: [id number] Date of Birth/Sex: Treating RN: 06/18/1936 (87 y.o. M) Primary Care Provider: Willow Ora Other Clinician: Referring Provider: Treating Provider/Extender: Elsie Saas Weeks in Treatment: 21 Active  Problems ICD-10 Encounter Code Description Active Date MDM Diagnosis L97.222 Non-pressure chronic ulcer of left calf with fat layer exposed 11/06/2022 No Yes L97.811 Non-pressure chronic ulcer of other part of right lower leg limited to breakdown 12/29/2022 No Yes of skin L98.9 Disorder of the skin and subcutaneous tissue, unspecified 08/11/2022 No Yes I87.2 Venous insufficiency (chronic) (peripheral) 08/11/2022 No Yes I10 Essential (primary) hypertension 08/11/2022 No Yes KEONDRE, MARKSON ([id number]) [id number] Page 6 of 11 Inactive Problems Resolved Problems ICD-10 Code Description Active Date Resolved Date L97.821 Non-pressure chronic ulcer of other part of left lower leg limited to breakdown of skin 11/06/2022 11/06/2022 Electronic Signature(s) Signed: 01/07/2023 1:58:08 PM By: Duanne Guess MD FACS Entered By: Duanne Guess on 01/07/2023 13:58:08 -------------------------------------------------------------------------------- Progress Note Details Patient Name: Date of Service: SHO [name], [name] 01/07/2023 12:30 PM Medical Record Number: [id number] Patient Account Number: [id number] Date of Birth/Sex: Treating RN: 1936-08-04 (87 y.o. M) Primary Care Provider: Willow Ora Other Clinician: Referring Provider: Treating Provider/Extender: Cresenciano Genre in Treatment: 21 Subjective Chief Complaint Information obtained from Patient Left lower extremity wound History of Present Illness (HPI) [name] is an 87 year old male with a past medical history of CAD s/p DES to LCx 2006, carotid artery disease, hypertension, hyperlipidemia, paroxysmal atrial fibrillation on Eliquis and Cardizem that presents today for right lower extremity wound. He was seen by Dr. Drue Novel, his primary care provider on 4/29 for this issue and was referred to our clinic. Patient states that he had a small wound that spontaneously started in October 2021 and has not  healed. He states this has progressively gotten larger. He has been using acne cream prescribed by the dermatologist for this issue. He reports drainage to the wound but no purulent drainage. He reports mild soreness to the wound. He has swelling in his right leg greater than left and reports this is a chronic issue for the past 1 to 2 years. He denies resting leg pain or pain with ambulation. Of note He was prescribed doxycycline at the end of last month and has finished this course for possible skin infection to the  wound site. 02/06/2021; I am seeing this patient who was admitted to the clinic last week by Dr. Mikey Bussing. He has predominantly I think a venous insufficiency wound on the right medial lower leg he has been using Santyl Hydrofera Blue under 3 layer compression. Arterial studies were ordered last week but do not seem to been put through. He is not a diabetic. He did have venous reflux studies done in August 2020. He did have abnormal reflux time was noted in the great saphenous vein in the distal thigh and the great saphenous vein at the knee. There was no evidence of DVT or SVT at the time he was not felt to have large enough saphenous veins on either side for intervention. Compression stockings at least knee-high were recommended. Follow-up was on a as needed basis with Dr. Randie Heinz The patient does not describe claudication but his pulses in his feet are not vibrant this could be because of the swelling 5/26; patient presents for 1 week follow-up. He has been using Hydrofera Blue under 3 layer compression. He had arterial studies done. He has no issues or complaints today. He denies signs of infection. He has his juxta light compressions with him today. 6/3; patient presents for 1 week follow-up. He has been using Hydrofera Blue under 3 layer compression. He denies signs and symptoms of infection. He did have bright green drainage which he states he has not seen before when the dressing was  taken off today. He is using his juxta light compression to the left leg. 6/10; patient presents for 1 week follow-up. He has been tolerating Hydrofera Blue with 3 layer compression. He again reports bright green drainage. However he denies any signs of infection. He has been using juxta light compression to the left leg. 6/24; patient presents for 2-week follow-up. He has been tolerating Hydrofera Blue with 3 layer compression. Today he is healed. He brought his juxta lite compression for the right leg and continues to wear the juxta light compression on the left leg READMISSION 05/05/2022 The patient returns to clinic today with a new wound on his left posterior leg. He has been wearing his juxta lite stockings on a regular basis, but on exam he still has 3+ pitting edema. I suspect he has lymphedema in addition to his known venous reflux. The wounds are scattered geographic wounds with light slough accumulation. They have been applying mupirocin and CeraVe ointment. He does have a VP shunt placement scheduled for next week, but it sounds like his neurosurgeon is not aware of the fact that he has an open wound. 05/11/2022: His VP shunt placement has been postponed while we get his wound to heal. The wounds are all smaller today with just a little eschar and minimal slough. Edema control is good. No concern for infection. 05/18/2022: All of the open areas have contracted and there is good perimeter epithelialization. There is a little bit of eschar and slough on the open areas. Good edema control. No concern for infection. 05/26/2022: Many of the small open areas have closed. There is Hydrofera Blue sponge stuck in a number of places and this is unable to be removed by the intake nurse. There is a fair amount of eschar and a little bit of slough present. 06/02/2022: Continued closure of small open skin sites. There are just a couple remaining and these have a little eschar on the surface. Edema control  is SINAN, TUCH ([id number]) [id number] Page 7 of 11 excellent. 06/09/2022: We are  down to just 1 small open area. There is some eschar and slough present. No concern for infection. Edema control is excellent. 06/16/2022: His wound is healed. READMISSION 08/11/2022 Mr. Short underwent a successful VP shunt placement. He returns to clinic today because of a suspicious lesion on his left posterior calf. He actually has no open wounds and his edema control is excellent. He is wearing his juxta lite stockings religiously. On his left posterior calf, there is a raised scaly lesion concerning for potential squamous cell carcinoma. 08/25/2022: The biopsy that I took at his last visit was consistent with a well-differentiated squamous cell carcinoma. He has an appointment at the skin surgery center on December 14. The wound from the biopsy has some slough accumulation, but no concern for infection. 11/06/2022: Since his last visit here, he has undergone Mohs surgery for the squamous cell carcinoma that I biopsied. He has a fairly substantial and deep defect. Apparently the procedure took place between 10 to 12 days ago and he has been in an [employer] ever since. When his Unna boot was removed, he was found to have crusting on his anterior tibial surface with small superficial wounds underneath. 11/16/2022: The anterior tibial surface wounds seem to largely be closed with just a couple of tiny open areas under some eschar. The Mohs defect is quite a bit cleaner, but still with nonviable subcutaneous tissue present along with slough and eschar, much of which is dried up Iodoflex. 11/24/2022: The anterior tibial wounds are closed. The Mohs defect is cleaner again this week with much less nonviable tissue. 12/11/2022: The Mohs defect continues to contract. There is good granulation tissue underneath a layer of dried up Iodoflex and some slough. 12/21/2022: The wound is smaller again this  week, as well as shallower. There is a little bit of slough on the surface. 12/29/2022: His right leg has become massively swollen and he has had skin breakdown occur as a result with a lot of serous drainage. He has not been particularly compliant with compression garment wear, nor has he been elevating his legs. The wound on his left leg is smaller and edema control on the side is very good. 01/07/2023: He has built up some slough and eschar on the right leg, but edema control has improved markedly. The wound on his left leg is much smaller again today with just a little slough on the surface. Patient History Information obtained from Patient. Family History Cancer - Father, Diabetes - Maternal Grandparents, Heart Disease - Maternal Grandparents, Hypertension - Maternal Grandparents, No family history of Hereditary Spherocytosis, Kidney Disease, Lung Disease, Seizures, Stroke, Thyroid Problems, Tuberculosis. Social History Former smoker - Quit over 30 years ago, Marital Status - Married, Alcohol Use - Rarely, Drug Use - No History, Caffeine Use - Daily. Medical History Eyes Patient has history of Glaucoma Cardiovascular Patient has history of Arrhythmia - afib, Coronary Artery Disease, Hypertension, Peripheral Venous Disease Endocrine Denies history of Type I Diabetes, Type II Diabetes Genitourinary Denies history of End Stage Renal Disease Integumentary (Skin) Denies history of History of Burn Musculoskeletal Patient has history of Osteoarthritis Oncologic Denies history of Received Chemotherapy, Received Radiation Psychiatric Denies history of Anorexia/bulimia, Confinement Anxiety Hospitalization/Surgery History - lumbar laminectomy. - total knee arthropathy right. - bil total hip arthroplasty. - coronary stents. - toe surgery. - tonsillectomy. - shunt insertion. Medical A Surgical History Notes nd Gastrointestinal colon  polyps Endocrine Hypothyroidism Genitourinary BPH Musculoskeletal Bilat Hip Replacements, Right Knee Replacement, lumbar stenosis Neurologic hydrocephalus  Oncologic Skin Cancer multiple sites BELVIN, GAUSS ([id number]) [id number] Page 8 of 11 Objective Constitutional Slightly hypertensive. no acute distress. Vitals Time Taken: 12:38 PM, Height: 69 in, Source: Stated, Weight: 198 lbs, Source: Stated, BMI: 29.2, Temperature: 97.8 F, Pulse: 72 bpm, Respiratory Rate: 18 breaths/min, Blood Pressure: 145/77 mmHg. Respiratory Normal work of breathing on room air. General Notes: 01/07/2023: He has built up some slough and eschar on the right leg, but edema control has improved markedly. The wound on his left leg is much smaller again today with just a little slough on the surface. Integumentary (Hair, Skin) Wound #3 status is Open. Original cause of wound was Puncture. The date acquired was: 08/12/2022. The wound has been in treatment 19 weeks. The wound is located on the Left,Posterior Lower Leg. The wound measures 1.4cm length x 0.9cm width x 0.1cm depth; 0.99cm^2 area and 0.099cm^3 volume. There is Fat Layer (Subcutaneous Tissue) exposed. There is no tunneling or undermining noted. There is a medium amount of serosanguineous drainage noted. The wound margin is distinct with the outline attached to the wound base. There is large (67-100%) red granulation within the wound bed. There is a small (1-33%) amount of necrotic tissue within the wound bed including Eschar and Adherent Slough. The periwound skin appearance had no abnormalities noted for texture. The periwound skin appearance had no abnormalities noted for moisture. The periwound skin appearance exhibited: Hemosiderin Staining. Periwound temperature was noted as No Abnormality. Wound #5 status is Open. Original cause of wound was Gradually Appeared. The date acquired was: 12/29/2022. The wound has been in  treatment 1 weeks. The wound is located on the Right,Medial Lower Leg. The wound measures 7.2cm length x 7.5cm width x 0.1cm depth; 42.412cm^2 area and 4.241cm^3 volume. There is Fat Layer (Subcutaneous Tissue) exposed. There is no tunneling or undermining noted. There is a medium amount of serous drainage noted. The wound margin is distinct with the outline attached to the wound base. There is medium (34-66%) red, pink granulation within the wound bed. There is a small (1- 33%) amount of necrotic tissue within the wound bed including Eschar and Adherent Slough. The periwound skin appearance had no abnormalities noted for texture. The periwound skin appearance exhibited: Dry/Scaly. The periwound skin appearance did not exhibit: Hemosiderin Staining. Periwound temperature was noted as No Abnormality. 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 There was a Selective/Open Wound Non-Viable Tissue Debridement with a total area of 1.26 sq cm performed by Duanne Guess, MD. With the following instrument(s): Curette to remove Non-Viable tissue/material. Material removed includes [facility] after achieving pain control using Lidocaine 4% Topical Solution. No specimens were taken. A time out was conducted at 13:15, prior to the start of the procedure. A Minimum amount of bleeding was controlled with Pressure. The procedure was tolerated well with a pain level of 0 throughout and a pain level of 0 following the procedure. Post Debridement Measurements: 1.4cm length x 0.9cm width x 0.1cm depth; 0.099cm^3 volume. Character of  Wound/Ulcer Post Debridement is improved. Post procedure Diagnosis Wound #3: Same as Pre-Procedure General Notes: Scribed for Dr. Lady Gary by J.Scotton. Wound #5 Pre-procedure diagnosis of Wound #5 is a Lymphedema located  on the Right,Medial Lower Leg . There was a Selective/Open Wound Non-Viable Tissue Debridement with a total area of 54 sq cm performed by Duanne Guess, MD. With the following instrument(s): Curette to remove Non-Viable tissue/material. Material removed includes Eschar and Slough and after achieving pain control using Lidocaine 4% T opical Solution. No specimens were taken. A time out was conducted at 13:15, prior to the start of the procedure. A Minimum amount of bleeding was controlled with Pressure. The procedure was tolerated well with a pain level of 0 throughout and a pain level of 0 following the procedure. Post Debridement Measurements: 7.2cm length x 7.5cm width x 0.1cm depth; 4.241cm^3 volume. Character of Wound/Ulcer Post Debridement is improved. Post procedure Diagnosis Wound #5: Same as Pre-Procedure General Notes: Scribed for Dr. Lady Gary by Baruch Merl. Plan Follow-up Appointments: Return Appointment in 1 week. - Dr. Lady Gary - Room 2 Other: - Tuesday 12/3022 at 12:30pm Room 2 Anesthetic: (In clinic) Topical Lidocaine 4% applied to wound bed Bathing/ Shower/ Hygiene: JASDEEP, KEPNER ([id number]) [id number] Page 9 of 11 May shower with protection but do not get wound dressing(s) wet. Protect dressing(s) with water repellant cover (for example, large plastic bag) or a cast cover and may then take shower. Edema Control - Lymphedema / SCD / Other: Elevate legs to the level of the heart or above for 30 minutes daily and/or when sitting for 3-4 times a day throughout the day. - Elevate legs throughout the day. When not walking, elevate legs. Avoid standing for long periods of time. Exercise regularly - including ankle circles while  sitting Moisturize legs daily. - both legs nightly after removing juxtalites Compression stocking or Garment 20-30 mm/Hg pressure to: - both legs daily WOUND #3: - Lower Leg Wound Laterality: Left, Posterior Cleanser: Soap and Water 1 x Per Day/30 Days Discharge Instructions: May shower and wash wound with dial antibacterial soap and water prior to dressing change. Cleanser: Wound Cleanser 1 x Per Day/30 Days Discharge Instructions: Cleanse the wound with wound cleanser prior to applying a clean dressing using gauze sponges, not tissue or cotton balls. Peri-Wound Care: Sween Lotion (Moisturizing lotion) 1 x Per Day/30 Days Discharge Instructions: Apply moisturizing lotion as directed Prim Dressing: Promogran Prisma Matrix, 4.34 (sq in) (silver collagen) 1 x Per Day/30 Days ary Discharge Instructions: Moisten collagen with saline or hydrogel Secondary Dressing: ABD Pad, 5x9 1 x Per Day/30 Days Discharge Instructions: Apply over primary dressing as directed. Secondary Dressing: Woven Gauze Sponge, Non-Sterile 4x4 in 1 x Per Day/30 Days Discharge Instructions: Apply over primary dressing as directed. Com pression Wrap: Urgo K2 Lite, two layer compression system, regular 1 x Per Day/30 Days Discharge Instructions: Apply Urgo K2 Lite as directed (alternative to 3 layer compression). WOUND #5: - Lower Leg Wound Laterality: Right, Medial Cleanser: Soap and Water 1 x Per Day/30 Days Discharge Instructions: May shower and wash wound with dial antibacterial soap and water prior to dressing change. Cleanser: Wound Cleanser 1 x Per Day/30 Days Discharge Instructions: Cleanse the wound with wound cleanser prior to applying a clean dressing using gauze sponges, not tissue or cotton balls. Peri-Wound Care: Sween Lotion (Moisturizing lotion) 1 x Per Day/30 Days Discharge Instructions: Apply moisturizing lotion as directed Prim Dressing: Maxorb Extra Ag+ Alginate Dressing, 4x4.75 (in/in) 1 x Per Day/30  Days ary Discharge Instructions: Apply to wound bed as instructed Secondary Dressing: ABD Pad, 5x9 1 x Per Day/30 Days Discharge Instructions: Apply over primary dressing as directed. Secondary Dressing: Woven Gauze Sponge, Non-Sterile  4x4 in 1 x Per Day/30 Days Discharge Instructions: Apply over primary dressing as directed. Com pression Wrap: Urgo K2 Lite, two layer compression system, regular 1 x Per Day/30 Days Discharge Instructions: Apply Urgo K2 Lite as directed (alternative to 3 layer compression). 01/07/2023: He has built up some slough and eschar on the right leg, but edema control has improved markedly. The wound on his left leg is much smaller again today with just a little slough on the surface. I used a curette to debride slough and eschar from the wounds on his right leg and slough from the wounds on the left leg. We will continue Prisma silver collagen to the left and silver alginate to the right. Continue bilateral 3 layer compression wraps or equivalent. He needs to elevate his legs was reinforced again today. Follow-up in 1 week. Electronic Signature(s) Signed: 01/07/2023 2:08:15 PM By: Duanne Guess MD FACS Entered By: Duanne Guess on 01/07/2023 14:08:15 -------------------------------------------------------------------------------- HxROS Details Patient Name: Date of Service: SHO [name], [name] 01/07/2023 12:30 PM Medical Record Number: [id number] Patient Account Number: [id number] Date of Birth/Sex: Treating RN: 1936-03-20 (87 y.o. M) Primary Care Provider: Willow Ora Other Clinician: Referring Provider: Treating Provider/Extender: Cresenciano Genre in Treatment: 21 Information Obtained From Patient Eyes Medical History: Positive for: Glaucoma Cardiovascular Medical History: Positive for: Arrhythmia - afib; Coronary Artery Disease; Hypertension; Peripheral Venous Disease Gastrointestinal Medical HistoryMarland Kitchen MCKENNON, ZWART ([id number])  [id number] Page 10 of 11 Past Medical History Notes: colon polyps Endocrine Medical History: Negative for: Type I Diabetes; Type II Diabetes Past Medical History Notes: Hypothyroidism Genitourinary Medical History: Negative for: End Stage Renal Disease Past Medical History Notes: BPH Integumentary (Skin) Medical History: Negative for: History of Burn Musculoskeletal Medical History: Positive for: Osteoarthritis Past Medical History Notes: Bilat Hip Replacements, Right Knee Replacement, lumbar stenosis Neurologic Medical History: Past Medical History Notes: hydrocephalus Oncologic Medical History: Negative for: Received Chemotherapy; Received Radiation Past Medical History Notes: Skin Cancer multiple sites Psychiatric Medical History: Negative for: Anorexia/bulimia; Confinement Anxiety HBO Extended History Items Eyes: Glaucoma Immunizations Pneumococcal Vaccine: Received Pneumococcal Vaccination: Yes Received Pneumococcal Vaccination On or After 60th Birthday: Yes Implantable Devices None Hospitalization / Surgery History Type of Hospitalization/Surgery lumbar laminectomy total knee arthropathy right bil total hip arthroplasty coronary stents toe surgery tonsillectomy shunt insertion Family and Social History Cancer: Yes - Father; Diabetes: Yes - Maternal Grandparents; Heart Disease: Yes - Maternal Grandparents; Hereditary Spherocytosis: No; Hypertension: Yes - Maternal Grandparents; Kidney Disease: No; Lung Disease: No; Seizures: No; Stroke: No; Thyroid Problems: No; Tuberculosis: No; Former smoker - Quit over 30 years ago; Marital Status - Married; Alcohol Use: Rarely; Drug Use: No History; Caffeine Use: Daily; Financial Concerns: No; Food, Clothing or Shelter Needs: No; Support System Lacking: No; Transportation Concerns: No Electronic Signature(s) TYRIEK, HOFMAN ([id number]) [id number] Page 11 of  11 Signed: 01/07/2023 4:05:13 PM By: Duanne Guess MD FACS Entered By: Duanne Guess on 01/07/2023 14:06:04 -------------------------------------------------------------------------------- SuperBill Details Patient Name: Date of Service: SHO [name], [name] 01/07/2023 Medical Record Number: [id number] Patient Account Number: [id number] Date of Birth/Sex: Treating RN: 1935/09/26 (87 y.o. M) Primary Care Provider: Willow Ora Other Clinician: Referring Provider: Treating Provider/Extender: Elsie Saas Weeks in Treatment: 21 Diagnosis Coding ICD-10 Codes Code Description [phone] Non-pressure chronic ulcer of left calf with fat layer exposed L97.811 Non-pressure chronic ulcer of other part of right lower leg limited to breakdown of skin L98.9 Disorder of the skin and subcutaneous tissue, unspecified I87.2 Venous insufficiency (  chronic) (peripheral) I10 Essential (primary) hypertension Facility Procedures : CPT4 Code: [id number] Description: [phone] - DEBRIDE WOUND 1ST 20 SQ CM OR < ICD-10 Diagnosis Description L97.222 Non-pressure chronic ulcer of left calf with fat layer exposed L97.811 Non-pressure chronic ulcer of other part of right lower leg limited to breakdown Modifier: of skin Quantity: 1 : CPT4 Code: [id number] Description: 97598 - DEBRIDE WOUND EA ADDL 20 SQ CM ICD-10 Diagnosis Description L97.222 Non-pressure chronic ulcer of left calf with fat layer exposed L97.811 Non-pressure chronic ulcer of other part of right lower leg limited to breakdown Modifier: of skin Quantity: 2 Physician Procedures : CPT4 Code Description Modifier [id number] - WC PHYS LEVEL 3 - EST PT 25 ICD-10 Diagnosis Description L97.222 Non-pressure chronic ulcer of left calf with fat layer exposed L97.811 Non-pressure chronic ulcer of other part of right lower leg limited  to breakdown of skin L98.9 Disorder of the skin and subcutaneous tissue, unspecified I87.2 Venous insufficiency (chronic)  (peripheral) Quantity: 1 : [id number] 97597 - WC PHYS DEBR WO ANESTH 20 SQ CM ICD-10 Diagnosis Description L97.222 Non-pressure chronic ulcer of left calf with fat layer exposed L97.811 Non-pressure chronic ulcer of other part of right lower leg limited to breakdown of skin Quantity: 1 : [id number] 97598 - WC PHYS DEBR WO ANESTH EA ADD 20 CM ICD-10 Diagnosis Description L97.222 Non-pressure chronic ulcer of left calf with fat layer exposed L97.811 Non-pressure chronic ulcer of other part of right lower leg limited to breakdown of skin Quantity: 2 Electronic Signature(s) Signed: 01/07/2023 2:08:40 PM By: Duanne Guess MD FACS Entered By: Duanne Guess on 01/07/2023 14:08:39

## 2023-01-08 NOTE — Progress Notes (Signed)
[name], [name] ([id number]) [id number] Page 1 of 8 Visit Report for 01/07/2023 Arrival Information Details Patient Name: Date of Service: [name], [name] [name] 01/07/2023 12:30 PM Medical Record Number: [id number] Patient Account Number: [id number] Date of Birth/Sex: Treating RN: [birth year], [name] (87 y.o. [name] Primary Care Devann Cribb: Willow Ora Other Clinician: Referring Marnae Madani: Treating Bergen Melle/Extender: Cresenciano Genre in Treatment: 21 Visit Information History Since Last Visit Added or deleted any medications: No Patient Arrived: Ambulatory Any new allergies or adverse reactions: No Arrival Time: 12:37 Had a fall or experienced change in No Accompanied By: wife activities of daily living that may affect Transfer Assistance: None risk of falls: Patient Identification Verified: Yes Signs or symptoms of abuse/neglect since last visito No Patient Requires Transmission-Based Precautions: No Hospitalized since last visit: No Patient Has Alerts: No Implantable device outside of the clinic excluding No cellular tissue based products placed in the center since last visit: Has Dressing in Place as Prescribed: Yes Has Compression in Place as Prescribed: Yes Pain Present Now: Yes Electronic Signature(s) Signed: 01/07/2023 5:04:32 PM By: Karie Schwalbe RN Entered By: Karie Schwalbe on 01/07/2023 12:38:24 -------------------------------------------------------------------------------- Compression Therapy Details Patient Name: Date of Service: [name], [name] [name] 01/07/2023 12:30 PM Medical Record Number: [id number] Patient Account Number: [id number] Date of Birth/Sex: Treating RN: [birth year] (87 y.o. [name] Primary Care Aydin Cavalieri: Willow Ora Other Clinician: Referring Aerilyn Slee: Treating Merle Whitehorn/Extender: Cresenciano Genre in Treatment: 21 Compression Therapy Performed for Wound Assessment: Wound #3  Left,Posterior Lower Leg Performed By: Clinician Karie Schwalbe, RN Compression Type: Three Layer Post Procedure Diagnosis Same as Pre-procedure Electronic Signature(s) Signed: 01/07/2023 5:04:32 PM By: Karie Schwalbe RN Entered By: Karie Schwalbe on 01/07/2023 16:33:03 -------------------------------------------------------------------------------- Compression Therapy Details Patient Name: Date of Service: [name], [name] [name] 01/07/2023 12:30 PM Medical Record Number: [id number] Patient Account Number: [id number] Date of Birth/Sex: Treating RN: [name][birth year] (87 y.o. [name] Primary Care Ashey Tramontana: Willow Ora Other Clinician: Referring Joeline Freer: Treating Aadhya Bustamante/Extender: Cresenciano Genre in Treatment: 21 Compression Therapy Performed for Wound Assessment: Wound #5 Right,Medial Lower Leg Performed By: Clinician Karie Schwalbe, RN Compression Type: Three Layer Post Procedure Diagnosis Same as Pre-procedure [name], [name] ([id number]) [id number] Page 2 of 8 Electronic Signature(s) Signed: 01/07/2023 5:04:32 PM By: Karie Schwalbe RN Entered By: Karie Schwalbe on 01/07/2023 16:33:03 -------------------------------------------------------------------------------- Encounter Discharge Information Details Patient Name: Date of Service: [name], [name] [name] 01/07/2023 12:30 PM Medical Record Number: [id number] Patient Account Number: [id number] Date of Birth/Sex: Treating RN: [name]/[birth year] (87 y.o. [name] Primary Care Luisantonio Adinolfi: Willow Ora Other Clinician: Referring Armari Fussell: Treating Mickell Birdwell/Extender: Cresenciano Genre in Treatment: 21 Encounter Discharge Information Items Post Procedure Vitals Discharge Condition: Stable Temperature (F): 97.8 Ambulatory Status: Ambulatory Pulse (bpm): 72 Discharge Destination: Home Respiratory Rate (breaths/min): 18 Transportation: Private Auto Blood Pressure (mmHg):  145/77 Accompanied By: spouse Schedule Follow-up Appointment: Yes Clinical Summary of Care: Patient Declined Electronic Signature(s) Signed: 01/07/2023 5:04:32 PM By: Karie Schwalbe RN Entered By: Karie Schwalbe on 01/07/2023 16:33:51 -------------------------------------------------------------------------------- Lower Extremity Assessment Details Patient Name: Date of Service: [name], [name] [name] 01/07/2023 12:30 PM Medical Record Number: [id number] Patient Account Number: [id number] Date of Birth/Sex: Treating RN: [birth year]/[name] (87 y.o. [name] Primary Care Aaleah Hirsch: Willow Ora Other Clinician: Referring Stephie Xu: Treating Polina Burmaster/Extender: Elsie Saas Weeks in Treatment: 21 Edema Assessment Assessed: [Left: No] [Right: No] [Left: Edema] [Right: :] Calf Left: Right: Point of Measurement: From Medial  Instep 35.1 cm 41 cm Ankle Left: Right: Point of Measurement: From Medial Instep 22.3 cm 24 cm Vascular Assessment Pulses: Dorsalis Pedis Palpable: [Left:Yes] [Right:Yes] Electronic Signature(s) Signed: 01/07/2023 5:04:32 PM By: Karie Schwalbe RN Entered By: Karie Schwalbe on 01/07/2023 12:50:31 Multi Wound Chart Details -------------------------------------------------------------------------------- [name] ([id number]) [id number] Page 3 of 8 Patient Name: Date of Service: [name], [name] [name] 01/07/2023 12:30 PM Medical Record Number: [id number] Patient Account Number: [id number] Date of Birth/Sex: Treating RN: [birth year] (87 y.o. M) Primary Care Olevia Westervelt: Willow Ora Other Clinician: Referring Caitrin Pendergraph: Treating Tymel Conely/Extender: Cresenciano Genre in Treatment: 21 Vital Signs Height(in): 69 Pulse(bpm): 72 Weight(lbs): 198 Blood Pressure(mmHg): 145/77 Body Mass Index(BMI): 29.2 Temperature(F): 97.8 Respiratory Rate(breaths/min): 18 Wound Assessments Wound Number: 3 5 N/A Photos: N/A Left,  Posterior Lower Leg Right, Medial Lower Leg N/A Wound Location: Puncture Gradually Appeared N/A Wounding Event: Lesion Lymphedema N/A Primary Etiology: Glaucoma, Arrhythmia, Coronary Glaucoma, Arrhythmia, Coronary N/A Comorbid History: Artery Disease, Hypertension, Artery Disease, Hypertension, Peripheral Venous Disease, Peripheral Venous Disease, Osteoarthritis Osteoarthritis [name]/22/2023 12/29/2022 N/A Date Acquired: 19 1 N/A Weeks of Treatment: Open Open N/A Wound Status: No No N/A Wound Recurrence: 1.4x0.9x0.1 7.2x7.5x0.1 N/A Measurements L x W x D (cm) 0.99 42.412 N/A A (cm) : rea 0.099 4.241 N/A Volume (cm) : -1294.40% 47.10% N/A % Reduction in A rea: -607.10% 47.10% N/A % Reduction in Volume: Full Thickness Without Exposed Full Thickness Without Exposed N/A Classification: Support Structures Support Structures Medium Medium N/A Exudate A mount: Serosanguineous Serous N/A Exudate Type: red, brown amber N/A Exudate Color: Distinct, outline attached Distinct, outline attached N/A Wound Margin: Large (67-100%) Medium (34-66%) N/A Granulation A mount: Red Red, Pink N/A Granulation Quality: Small (1-33%) Small (1-33%) N/A Necrotic A mount: Eschar, Adherent Slough Eschar, Adherent Slough N/A Necrotic Tissue: Fat Layer (Subcutaneous Tissue): Yes Fat Layer (Subcutaneous Tissue): Yes N/A Exposed Structures: Fascia: No Fascia: No Tendon: No Tendon: No Muscle: No Muscle: No Joint: No Joint: No Bone: No Bone: No Medium (34-66%) None N/A Epithelialization: Debridement - Selective/Open Wound Debridement - Selective/Open Wound N/A Debridement: Pre-procedure Verification/Time Out 13:15 13:15 N/A Taken: Lidocaine 4% Topical Solution Lidocaine 4% Topical Solution N/A Pain Control: Ambulance person, [employer] N/A Tissue Debrided: Non-Viable Tissue Non-Viable Tissue N/A Level: 1.26 54 N/A Debridement A (sq cm): rea Curette Curette  N/A Instrument: Minimum Minimum N/A Bleeding: Pressure Pressure N/A Hemostasis A chieved: 0 0 N/A Procedural Pain: 0 0 N/A Post Procedural Pain: Procedure was tolerated well Procedure was tolerated well N/A Debridement Treatment Response: 1.4x0.9x0.1 7.2x7.5x0.1 N/A Post Debridement Measurements L x W x D (cm) 0.099 4.241 N/A Post Debridement Volume: (cm) No Abnormalities Noted No Abnormalities Noted N/A Periwound Skin Texture: No Abnormalities Noted Dry/Scaly: Yes N/A Periwound Skin Moisture: Hemosiderin Staining: Yes Hemosiderin Staining: No N/A Periwound Skin Color: No Abnormality No Abnormality N/A Temperature: Debridement Debridement N/A Procedures Performed: [name], [name] ([id number]) [id number] Page 4 of 8 Treatment Notes Electronic Signature(s) Signed: 01/07/2023 1:58:15 PM By: Duanne Guess MD FACS Entered By: Duanne Guess on 01/07/2023 13:58:15 -------------------------------------------------------------------------------- Multi-Disciplinary Care Plan Details Patient Name: Date of Service: [name], [name] [name] 01/07/2023 12:30 PM Medical Record Number: [id number] Patient Account Number: [id number] Date of Birth/Sex: Treating RN: Jul [birth year], [name] (87 y.o. [name] Primary Care Eldonna Neuenfeldt: Willow Ora Other Clinician: Referring Alistar Mcenery: Treating Cannon Arreola/Extender: Cresenciano Genre in Treatment: 21 Active Inactive Necrotic Tissue Nursing Diagnoses: Impaired tissue integrity related to necrotic/devitalized tissue Knowledge deficit related to management of necrotic/devitalized tissue  Goals: Necrotic/devitalized tissue will be minimized in the wound bed Date Initiated: 08/25/2022 Target Resolution Date: 06/19/2023 Goal Status: Active Patient/caregiver will verbalize understanding of reason and process for debridement of necrotic tissue Date Initiated: 08/25/2022 Target Resolution Date: 06/19/2023 Goal Status:  Active Interventions: Assess patient pain level pre-, during and post procedure and prior to discharge Provide education on necrotic tissue and debridement process Treatment Activities: Apply topical anesthetic as ordered : 08/25/2022 Notes: Electronic Signature(s) Signed: 01/07/2023 5:04:32 PM By: Karie Schwalbe RN Entered By: Karie Schwalbe on 01/07/2023 16:31:58 -------------------------------------------------------------------------------- Pain Assessment Details Patient Name: Date of Service: [name], [name] [name] 01/07/2023 12:30 PM Medical Record Number: [id number] Patient Account Number: [id number] Date of Birth/Sex: Treating RN: [birth year]-[name] (87 y.o. [name] Primary Care Jarely Juncaj: Willow Ora Other Clinician: Referring Sergey Ishler: Treating Dhana Totton/Extender: Cresenciano Genre in Treatment: 21 Active Problems Location of Pain Severity and Description of Pain Patient Has Paino No Site Locations [name], [name] ([id number]) [id number] Page 5 of 8 Pain Management and Medication Current Pain Management: Electronic Signature(s) Signed: 01/07/2023 5:04:32 PM By: Karie Schwalbe RN Entered By: Karie Schwalbe on 01/07/2023 12:39:12 -------------------------------------------------------------------------------- Patient/Caregiver Education Details Patient Name: Date of Service: [name], [name] [name] 4/18/2024andnbsp12:30 PM Medical Record Number: [id number] Patient Account Number: [id number] Date of Birth/Gender: Treating RN: [name]/[birth year] (87 y.o. [name] Primary Care Physician: Willow Ora Other Clinician: Referring Physician: Treating Physician/Extender: Cresenciano Genre in Treatment: 21 Education Assessment Education Provided To: Patient Education Topics Provided Wound/Skin Impairment: Methods: Explain/Verbal Responses: Return demonstration correctly Electronic Signature(s) Signed: 01/07/2023 5:04:32 PM  By: Karie Schwalbe RN Entered By: Karie Schwalbe on 01/07/2023 16:32:[name] -------------------------------------------------------------------------------- Wound Assessment Details Patient Name: Date of Service: [name], [name] [name] 01/07/2023 12:30 PM Medical Record Number: [id number] Patient Account Number: [id number] Date of Birth/Sex: Treating RN: [name], [name] (87 y.o. [name] Primary Care Amit Leece: Willow Ora Other Clinician: Referring Taryn Shellhammer: Treating Oluwafemi Villella/Extender: Elsie Saas Weeks in Treatment: 21 Wound Status Wound Number: 3 Primary Lesion Etiology: Wound Location: Left, Posterior Lower Leg Wound Open Wounding Event: Puncture Status: Date Acquired: [name]/22/2023 Comorbid Glaucoma, Arrhythmia, Coronary Artery Disease, Hypertension, Weeks Of Treatment: 19 History: Peripheral Venous Disease, Osteoarthritis Clustered Wound: No [name], [name] ([id number]) [id number] Page 6 of 8 Photos Wound Measurements Length: (cm) 1.4 Width: (cm) 0.9 Depth: (cm) 0.1 Area: (cm) 0.99 Volume: (cm) 0.099 % Reduction in Area: -1294.4% % Reduction in Volume: -607.1% Epithelialization: Medium (34-66%) Tunneling: No Undermining: No Wound Description Classification: Full Thickness Without Exposed Support Structures Wound Margin: Distinct, outline attached Exudate Amount: Medium Exudate Type: Serosanguineous Exudate Color: red, brown Foul Odor After Cleansing: No Slough/Fibrino Yes Wound Bed Granulation Amount: Large (67-100%) Exposed Structure Granulation Quality: Red Fascia Exposed: No Necrotic Amount: Small (1-33%) Fat Layer (Subcutaneous Tissue) Exposed: Yes Necrotic Quality: Eschar, Adherent Slough Tendon Exposed: No Muscle Exposed: No Joint Exposed: No Bone Exposed: No Periwound Skin Texture Texture Color No Abnormalities Noted: Yes No Abnormalities Noted: No Hemosiderin Staining: Yes Moisture No Abnormalities Noted:  Yes Temperature / Pain Temperature: No Abnormality Treatment Notes Wound #3 (Lower Leg) Wound Laterality: Left, Posterior Cleanser Soap and Water Discharge Instruction: May shower and wash wound with dial antibacterial soap and water prior to dressing change. Wound Cleanser Discharge Instruction: Cleanse the wound with wound cleanser prior to applying a clean dressing using gauze sponges, not tissue or cotton balls. Peri-Wound Care Sween Lotion (Moisturizing lotion) Discharge Instruction: Apply moisturizing lotion as directed Topical Primary Dressing Promogran Prisma  Matrix, 4.34 (sq in) (silver collagen) Discharge Instruction: Moisten collagen with saline or hydrogel Secondary Dressing ABD Pad, 5x9 Discharge Instruction: Apply over primary dressing as directed. Woven Gauze Sponge, Non-Sterile 4x4 in Discharge Instruction: Apply over primary dressing as directed. Secured With [name], [name] ([id number]) [id number] Page 7 of 8 Compression Wrap Urgo K2 Lite, two layer compression system, regular Discharge Instruction: Apply Urgo K2 Lite as directed (alternative to 3 layer compression). Compression Stockings Add-Ons Electronic Signature(s) Signed: 01/07/2023 3:37:53 PM By: [place] Scrape Signed: 01/07/2023 5:04:32 PM By: Karie Schwalbe RN Entered By: [place] Scrape on 01/07/2023 12:54:29 -------------------------------------------------------------------------------- Wound Assessment Details Patient Name: Date of Service: [name], [name] [name] 01/07/2023 12:30 PM Medical Record Number: [id number] Patient Account Number: [id number] Date of Birth/Sex: Treating RN: 08/[birth year]/[name] (87 y.o. [name] Primary Care Khalia Gong: Willow Ora Other Clinician: Referring Hriday Stai: Treating Keyah Blizard/Extender: Elsie Saas Weeks in Treatment: 21 Wound Status Wound Number: 5 Primary Lymphedema Etiology: Wound Location: Right, Medial Lower Leg Wound  Open Wounding Event: Gradually Appeared Status: Date Acquired: 12/29/2022 Comorbid Glaucoma, Arrhythmia, Coronary Artery Disease, Hypertension, Weeks Of Treatment: 1 History: Peripheral Venous Disease, Osteoarthritis Clustered Wound: No Photos Wound Measurements Length: (cm) 7.2 Width: (cm) 7.5 Depth: (cm) 0.1 Area: (cm) 42.412 Volume: (cm) 4.241 % Reduction in Area: 47.1% % Reduction in Volume: 47.1% Epithelialization: None Tunneling: No Undermining: No Wound Description Classification: Full Thickness Without Exposed Support Structures Wound Margin: Distinct, outline attached Exudate Amount: Medium Exudate Type: Serous Exudate Color: amber Foul Odor After Cleansing: No Slough/Fibrino Yes Wound Bed Granulation Amount: Medium (34-66%) Exposed Structure Granulation Quality: Red, Pink Fascia Exposed: No Necrotic Amount: Small (1-33%) Fat Layer (Subcutaneous Tissue) Exposed: Yes Necrotic Quality: Eschar, Adherent Slough Tendon Exposed: No Muscle Exposed: No Joint Exposed: No Bone Exposed: No Periwound Skin Texture Texture Color No Abnormalities Noted: Yes No Abnormalities Noted: No Hemosiderin Staining: No [name], [name] ([id number]) [id number] Page 8 of 8 Hemosiderin Staining: No Moisture No Abnormalities Noted: No Temperature / Pain Dry / Scaly: Yes Temperature: No Abnormality Treatment Notes Wound #5 (Lower Leg) Wound Laterality: Right, Medial Cleanser Soap and Water Discharge Instruction: May shower and wash wound with dial antibacterial soap and water prior to dressing change. Wound Cleanser Discharge Instruction: Cleanse the wound with wound cleanser prior to applying a clean dressing using gauze sponges, not tissue or cotton balls. Peri-Wound Care Sween Lotion (Moisturizing lotion) Discharge Instruction: Apply moisturizing lotion as directed Topical Primary Dressing Maxorb Extra Ag+ Alginate Dressing, 4x4.75  (in/in) Discharge Instruction: Apply to wound bed as instructed Secondary Dressing ABD Pad, 5x9 Discharge Instruction: Apply over primary dressing as directed. Woven Gauze Sponge, Non-Sterile 4x4 in Discharge Instruction: Apply over primary dressing as directed. Secured With Compression Wrap Urgo K2 Lite, two layer compression system, regular Discharge Instruction: Apply Urgo K2 Lite as directed (alternative to 3 layer compression). Compression Stockings Add-Ons Electronic Signature(s) Signed: 01/07/2023 3:37:53 PM By: Dayton Scrape Signed: 01/07/2023 5:04:32 PM By: Karie Schwalbe RN Entered By: [place] Scrape on 01/07/2023 12:56:50 -------------------------------------------------------------------------------- Vitals Details Patient Name: Date of Service: [name], [name] [name] 01/07/2023 12:30 PM Medical Record Number: [id number] Patient Account Number: [id number] Date of Birth/Sex: Treating RN: 12/07/[name] (87 y.o. [name] Primary Care Rileyann Florance: Willow Ora Other Clinician: Referring Cristhian Vanhook: Treating Dulse Rutan/Extender: Cresenciano Genre in Treatment: 21 Vital Signs Time Taken: 12:38 Temperature (F): 97.8 Height (in): 69 Pulse (bpm): 72 Source: Stated Respiratory Rate (breaths/min): 18 Weight (lbs): 198 Blood Pressure (mmHg): 145/77 Source: Stated Reference  Range: 80 - 120 mg / dl Body Mass Index (BMI): 29.2 Electronic Signature(s) Signed: 01/07/2023 5:04:32 PM By: Karie Schwalbe RN Entered By: Karie Schwalbe on 01/07/2023 12:39:48

## 2023-01-12 ENCOUNTER — Encounter (HOSPITAL_BASED_OUTPATIENT_CLINIC_OR_DEPARTMENT_OTHER): Payer: Medicare Other | Admitting: General Surgery

## 2023-01-12 DIAGNOSIS — I10 Essential (primary) hypertension: Secondary | ICD-10-CM | POA: Diagnosis not present

## 2023-01-12 DIAGNOSIS — Z79899 Other long term (current) drug therapy: Secondary | ICD-10-CM | POA: Diagnosis not present

## 2023-01-12 DIAGNOSIS — Z7901 Long term (current) use of anticoagulants: Secondary | ICD-10-CM | POA: Diagnosis not present

## 2023-01-12 DIAGNOSIS — E785 Hyperlipidemia, unspecified: Secondary | ICD-10-CM | POA: Diagnosis not present

## 2023-01-12 DIAGNOSIS — Z955 Presence of coronary angioplasty implant and graft: Secondary | ICD-10-CM | POA: Diagnosis not present

## 2023-01-12 DIAGNOSIS — I890 Lymphedema, not elsewhere classified: Secondary | ICD-10-CM | POA: Diagnosis not present

## 2023-01-12 DIAGNOSIS — I872 Venous insufficiency (chronic) (peripheral): Secondary | ICD-10-CM | POA: Diagnosis not present

## 2023-01-12 DIAGNOSIS — L97812 Non-pressure chronic ulcer of other part of right lower leg with fat layer exposed: Secondary | ICD-10-CM | POA: Diagnosis not present

## 2023-01-12 DIAGNOSIS — L988 Other specified disorders of the skin and subcutaneous tissue: Secondary | ICD-10-CM | POA: Diagnosis not present

## 2023-01-12 DIAGNOSIS — L97222 Non-pressure chronic ulcer of left calf with fat layer exposed: Secondary | ICD-10-CM | POA: Diagnosis not present

## 2023-01-12 DIAGNOSIS — I480 Paroxysmal atrial fibrillation: Secondary | ICD-10-CM | POA: Diagnosis not present

## 2023-01-12 DIAGNOSIS — I2510 Atherosclerotic heart disease of native coronary artery without angina pectoris: Secondary | ICD-10-CM | POA: Diagnosis not present

## 2023-01-12 DIAGNOSIS — Z982 Presence of cerebrospinal fluid drainage device: Secondary | ICD-10-CM | POA: Diagnosis not present

## 2023-01-13 NOTE — Progress Notes (Signed)
WYLAND, RASTETTER ([id number]) [id number] Page 1 of 9 Visit Report for 01/12/2023 Arrival Information Details Patient Name: Date of Service: [name], [name] 01/12/2023 2:00 PM Medical Record Number: [id number] Patient Account Number: [id number] Date of Birth/Sex: Treating RN: 22-Feb-1936 (87 y.o. [name] Primary Care [name]: [name] Other Clinician: Referring [name]: Treating [name]/Extender: [name] in Treatment: 22 Visit Information History Since Last Visit Added or deleted any medications: No Patient Arrived: [name] Any new allergies or adverse reactions: No Arrival Time: 14:11 Had a fall or experienced change in No Accompanied By: wife activities of daily living that may affect Transfer Assistance: None risk of falls: Patient Identification Verified: Yes Signs or symptoms of abuse/neglect since last visito No Secondary Verification Process Completed: Yes Hospitalized since last visit: No Patient Requires Transmission-Based Precautions: No Implantable device outside of the clinic excluding No Patient Has Alerts: No cellular tissue based products placed in the center since last visit: Has Dressing in Place as Prescribed: Yes Has Compression in Place as Prescribed: Yes Pain Present Now: No Electronic Signature(s) Signed: 01/12/2023 4:08:09 PM By: [name] Entered By: [name] on 01/12/2023 14:11:52 -------------------------------------------------------------------------------- Compression Therapy Details Patient Name: Date of Service: [name], [name] 01/12/2023 2:00 PM Medical Record Number: [id number] Patient Account Number: [id number] Date of Birth/Sex: Treating RN: 12-30-1935 (87 y.o. [name] Primary Care [name]: [name] Other Clinician: Referring [name]: Treating [name]/Extender: [name] in Treatment: 22 Compression Therapy  Performed for Wound Assessment: Wound #3 Left,Posterior Lower Leg Performed By: Clinician [name] Bruin, RN Compression Type: Double Layer Post Procedure Diagnosis Same as Pre-procedure Electronic Signature(s) Signed: 01/12/2023 4:08:09 PM By: [name] Entered By: [name] on 01/12/2023 14:33:44 -------------------------------------------------------------------------------- Compression Therapy Details Patient Name: Date of Service: [name], [name] 01/12/2023 2:00 PM Medical Record Number: [id number] Patient Account Number: [id number] Date of Birth/Sex: Treating RN: 11-08-1935 (87 y.o. [name] Primary Care [name]: [name] Other Clinician: Referring [name]: Treating Calysta Craigo/Extender: [name] in Treatment: 22 Compression Therapy Performed for Wound Assessment: Wound #5 Right,Medial Lower Leg Performed By: Clinician [name] Bruin, RN Compression Type: Double Layer Post Procedure Diagnosis Same as Pre-procedure [name], [name] E ([id number]) [id number] Page 2 of 9 Electronic Signature(s) Signed: 01/12/2023 4:08:09 PM By: [name] By: [name] on 01/12/2023 14:33:45 -------------------------------------------------------------------------------- Encounter Discharge Information Details Patient Name: Date of Service: [name], [name] 01/12/2023 2:00 PM Medical Record Number: [id number] Patient Account Number: [id number] Date of Birth/Sex: Treating RN: 09-Apr-1936 (87 y.o. [name] Primary Care Betsi Crespi: [name] Other Clinician: Referring [name]: Treating [name]/Extender: [name] in Treatment: 22 Encounter Discharge Information Items Post Procedure Vitals Discharge Condition: Stable Temperature (F): 98.5 Ambulatory Status: Cane Pulse (bpm): 71 Discharge Destination: Home Respiratory Rate (breaths/min):  18 Transportation: Private Auto Blood Pressure (mmHg): 121/66 Accompanied By: wife Schedule Follow-up Appointment: Yes Clinical Summary of Care: Patient Declined Electronic Signature(s) Signed: 01/12/2023 4:08:09 PM By: [name] Entered By: [name] on 01/12/2023 14:39:04 -------------------------------------------------------------------------------- Lower Extremity Assessment Details Patient Name: Date of Service: [name], [name] 01/12/2023 2:00 PM Medical Record Number: [id number] Patient Account Number: [id number] Date of Birth/Sex: Treating RN: 1936/06/22 (87 y.o. [name] Primary Care [name]: [name] Other Clinician: Referring [name]: Treating [name]/Extender: [name] Weeks in Treatment: 22 Edema Assessment Assessed: [Left: No] [Right: No] [Left: Edema] [Right: :] Calf Left: Right: Point of Measurement: From  Medial Instep 36 cm 41 cm Ankle Left: Right: Point of Measurement: From Medial Instep 22.5 cm 24.5 cm Vascular Assessment Pulses: Dorsalis Pedis Palpable: [Left:Yes] [Right:Yes] Electronic Signature(s) Signed: 01/12/2023 4:08:09 PM By: [name] Entered By: [name] on 01/12/2023 14:22:04 Multi Wound Chart Details -------------------------------------------------------------------------------- [name] ([id number]) [id number] Page 3 of 9 Patient Name: Date of Service: [name], [name] 01/12/2023 2:00 PM Medical Record Number: [id number] Patient Account Number: [id number] Date of Birth/Sex: Treating RN: 20-Jun-1936 (87 y.o. M) Primary Care [name]: [name] Other Clinician: Referring [name]: Treating [name]/Extender: [name] in Treatment: 22 Vital Signs Height(in): 69 Pulse(bpm): 71 Weight(lbs): 198 Blood Pressure(mmHg): 121/66 Body Mass Index(BMI): 29.2 Temperature(F): 98.5 Respiratory Rate(breaths/min):  18 Wound Assessments Wound Number: 3 5 N/A Photos: N/A Left, Posterior Lower Leg Right, Medial Lower Leg N/A Wound Location: Puncture Gradually Appeared N/A Wounding Event: Lesion Lymphedema N/A Primary Etiology: Glaucoma, Arrhythmia, Coronary Glaucoma, Arrhythmia, Coronary N/A Comorbid History: Artery Disease, Hypertension, Artery Disease, Hypertension, Peripheral Venous Disease, Peripheral Venous Disease, Osteoarthritis Osteoarthritis 08/12/2022 12/29/2022 N/A Date Acquired: 20 2 N/A Weeks of Treatment: Open Open N/A Wound Status: No No N/A Wound Recurrence: 1.3x1x0.1 4x7x0.1 N/A Measurements L x W x D (cm) 1.021 21.991 N/A A (cm) : rea 0.102 2.199 N/A Volume (cm) : -1338.00% 72.50% N/A % Reduction in A rea: -628.60% 72.60% N/A % Reduction in Volume: Full Thickness Without Exposed Full Thickness Without Exposed N/A Classification: Support Structures Support Structures Medium Medium N/A Exudate A mount: Serosanguineous Serous N/A Exudate Type: red, brown amber N/A Exudate Color: Distinct, outline attached Distinct, outline attached N/A Wound Margin: Large (67-100%) Medium (34-66%) N/A Granulation A mount: Red Red, Pink N/A Granulation Quality: Small (1-33%) Medium (34-66%) N/A Necrotic A mount: Adherent Slough Eschar, Adherent Slough N/A Necrotic Tissue: Fat Layer (Subcutaneous Tissue): Yes Fat Layer (Subcutaneous Tissue): Yes N/A Exposed Structures: Fascia: No Fascia: No Tendon: No Tendon: No Muscle: No Muscle: No Joint: No Joint: No Bone: No Bone: No Medium (34-66%) Small (1-33%) N/A Epithelialization: Debridement - Excisional Debridement - Selective/Open Wound N/A Debridement: Pre-procedure Verification/Time Out 14:34 14:34 N/A Taken: Lidocaine 4% Topical Solution Lidocaine 4% Topical Solution N/A Pain Control: Subcutaneous, Slough Necrotic/Eschar, [employer] N/A Tissue Debrided: Skin/Subcutaneous Tissue Non-Viable Tissue N/A Level: 1.02  21.98 N/A Debridement A (sq cm): rea Curette Curette N/A Instrument: Minimum Minimum N/A Bleeding: Pressure Pressure N/A Hemostasis A chieved: Procedure was tolerated well Procedure was tolerated well N/A Debridement Treatment Response: 1.3x1x0.1 4x7x0.1 N/A Post Debridement Measurements L x W x D (cm) 0.102 2.199 N/A Post Debridement Volume: (cm) No Abnormalities Noted No Abnormalities Noted N/A Periwound Skin Texture: No Abnormalities Noted Dry/Scaly: Yes N/A Periwound Skin Moisture: Hemosiderin Staining: Yes Hemosiderin Staining: Yes N/A Periwound Skin Color: No Abnormality No Abnormality N/A Temperature: Compression Therapy Compression Therapy N/A Procedures Performed: Debridement Debridement Treatment Notes [name], [name] ([id number]) [id number] Page 4 of 9 Wound #3 (Lower Leg) Wound Laterality: Left, Posterior Cleanser Soap and Water Discharge Instruction: May shower and wash wound with dial antibacterial soap and water prior to dressing change. Wound Cleanser Discharge Instruction: Cleanse the wound with wound cleanser prior to applying a clean dressing using gauze sponges, not tissue or cotton balls. Peri-Wound Care Sween Lotion (Moisturizing lotion) Discharge Instruction: Apply moisturizing lotion as directed Topical Primary Dressing Promogran Prisma Matrix, 4.34 (sq in) (silver collagen) Discharge Instruction: Moisten collagen with saline or hydrogel Secondary Dressing ABD Pad, 5x9 Discharge Instruction: Apply over primary dressing as directed. Woven Gauze Sponge, Non-Sterile  4x4 in Discharge Instruction: Apply over primary dressing as directed. Secured With Compression Wrap Urgo K2 Lite, two layer compression system, regular Discharge Instruction: Apply Urgo K2 Lite as directed (alternative to 3 layer compression). Compression Stockings Add-Ons Wound #5 (Lower Leg) Wound Laterality: Right, Medial Cleanser Soap and  Water Discharge Instruction: May shower and wash wound with dial antibacterial soap and water prior to dressing change. Wound Cleanser Discharge Instruction: Cleanse the wound with wound cleanser prior to applying a clean dressing using gauze sponges, not tissue or cotton balls. Peri-Wound Care Sween Lotion (Moisturizing lotion) Discharge Instruction: Apply moisturizing lotion as directed Topical Primary Dressing Maxorb Extra Ag+ Alginate Dressing, 4x4.75 (in/in) Discharge Instruction: Apply to wound bed as instructed Secondary Dressing ABD Pad, 5x9 Discharge Instruction: Apply over primary dressing as directed. Woven Gauze Sponge, Non-Sterile 4x4 in Discharge Instruction: Apply over primary dressing as directed. Secured With Compression Wrap Urgo K2 Lite, two layer compression system, regular Discharge Instruction: Apply Urgo K2 Lite as directed (alternative to 3 layer compression). Compression Stockings Add-Ons Electronic Signature(s) Signed: 01/12/2023 3:15:44 PM By: Duanne Guess MD FACS Entered By: Duanne Guess on 01/12/2023 15:15:44 [name], [name] ([id number]) [id number] Page 5 of 9 -------------------------------------------------------------------------------- Multi-Disciplinary Care Plan Details Patient Name: Date of Service: [name], [name] 01/12/2023 2:00 PM Medical Record Number: [id number] Patient Account Number: [id number] Date of Birth/Sex: Treating RN: 1935/10/16 (87 y.o. [name] Primary Care Arabia Nylund: [name] Other Clinician: Referring Saber Dickerman: Treating Keighan Amezcua/Extender: [name] in Treatment: 22 Active Inactive Necrotic Tissue Nursing Diagnoses: Impaired tissue integrity related to necrotic/devitalized tissue Knowledge deficit related to management of necrotic/devitalized tissue Goals: Necrotic/devitalized tissue will be minimized in the wound bed Date Initiated: 08/25/2022 Target  Resolution Date: 06/19/2023 Goal Status: Active Patient/caregiver will verbalize understanding of reason and process for debridement of necrotic tissue Date Initiated: 08/25/2022 Target Resolution Date: 06/19/2023 Goal Status: Active Interventions: Assess patient pain level pre-, during and post procedure and prior to discharge Provide education on necrotic tissue and debridement process Treatment Activities: Apply topical anesthetic as ordered : 08/25/2022 Notes: Electronic Signature(s) Signed: 01/12/2023 4:08:09 PM By: [name] Entered By: [name] on 01/12/2023 14:30:19 -------------------------------------------------------------------------------- Pain Assessment Details Patient Name: Date of Service: [name], [name] 01/12/2023 2:00 PM Medical Record Number: [id number] Patient Account Number: [id number] Date of Birth/Sex: Treating RN: 01-09-1936 (87 y.o. [name] Primary Care Avea Mcgowen: [name] Other Clinician: Referring Jericha Bryden: Treating Lukah Goswami/Extender: [name] in Treatment: 22 Active Problems Location of Pain Severity and Description of Pain Patient Has Paino No Site Locations Rate the pain. [name], [name] ([id number]) [id number] Page 6 of 9 Rate the pain. Current Pain Level: 0 Pain Management and Medication Current Pain Management: Electronic Signature(s) Signed: 01/12/2023 4:08:09 PM By: [name] Entered By: [name] on 01/12/2023 14:14:17 -------------------------------------------------------------------------------- Patient/Caregiver Education Details Patient Name: Date of Service: [name], [name] 4/23/2024andnbsp2:00 PM Medical Record Number: [id number] Patient Account Number: [id number] Date of Birth/Gender: Treating RN: 10/23/1935 (87 y.o. [name] Primary Care Physician: [name] Other Clinician: Referring Physician: Treating  Physician/Extender: [name] in Treatment: 22 Education Assessment Education Provided To: Patient Education Topics Provided Wound/Skin Impairment: Methods: Explain/Verbal Responses: Reinforcements needed, State content correctly Electronic Signature(s) Signed: 01/12/2023 4:08:09 PM By: [name] Entered By: [name] on 01/12/2023 14:30:32 -------------------------------------------------------------------------------- Wound Assessment Details Patient Name: Date of Service: [name], [name] 01/12/2023 2:00 PM Medical Record Number: [id number] Patient Account  Number: [id number] Date of Birth/Sex: Treating RN: 1935/11/20 (87 y.o. [name] Primary Care Jaimere Feutz: [name] Other Clinician: Referring Adael Culbreath: Treating Sathvika Ojo/Extender: [name] Weeks in Treatment: 22 Wound Status Wound Number: 3 Primary Lesion Etiology: Wound Location: Left, Posterior Lower Leg Wound Open Wounding Event: Puncture Status: Date Acquired: 08/12/2022 Comorbid Glaucoma, Arrhythmia, Coronary Artery Disease, Hypertension, Weeks Of Treatment: 20 History: Peripheral Venous Disease, Osteoarthritis Clustered Wound: No [name], [name] ([id number]) [id number] Page 7 of 9 Photos Wound Measurements Length: (cm) 1.3 Width: (cm) 1 Depth: (cm) 0.1 Area: (cm) 1.021 Volume: (cm) 0.102 % Reduction in Area: -1338% % Reduction in Volume: -628.6% Epithelialization: Medium (34-66%) Tunneling: No Undermining: No Wound Description Classification: Full Thickness Without Exposed Support Structures Wound Margin: Distinct, outline attached Exudate Amount: Medium Exudate Type: Serosanguineous Exudate Color: red, brown Foul Odor After Cleansing: No Slough/Fibrino Yes Wound Bed Granulation Amount: Large (67-100%) Exposed Structure Granulation Quality: Red Fascia Exposed: No Necrotic Amount: Small (1-33%) Fat  Layer (Subcutaneous Tissue) Exposed: Yes Necrotic Quality: Adherent Slough Tendon Exposed: No Muscle Exposed: No Joint Exposed: No Bone Exposed: No Periwound Skin Texture Texture Color No Abnormalities Noted: Yes No Abnormalities Noted: No Hemosiderin Staining: Yes Moisture No Abnormalities Noted: Yes Temperature / Pain Temperature: No Abnormality Treatment Notes Wound #3 (Lower Leg) Wound Laterality: Left, Posterior Cleanser Soap and Water Discharge Instruction: May shower and wash wound with dial antibacterial soap and water prior to dressing change. Wound Cleanser Discharge Instruction: Cleanse the wound with wound cleanser prior to applying a clean dressing using gauze sponges, not tissue or cotton balls. Peri-Wound Care Sween Lotion (Moisturizing lotion) Discharge Instruction: Apply moisturizing lotion as directed Topical Primary Dressing Promogran Prisma Matrix, 4.34 (sq in) (silver collagen) Discharge Instruction: Moisten collagen with saline or hydrogel Secondary Dressing ABD Pad, 5x9 Discharge Instruction: Apply over primary dressing as directed. Woven Gauze Sponge, Non-Sterile 4x4 in Discharge Instruction: Apply over primary dressing as directed. Secured With [name], [name] ([id number]) [id number] Page 8 of 9 Compression Wrap Urgo K2 Lite, two layer compression system, regular Discharge Instruction: Apply Urgo K2 Lite as directed (alternative to 3 layer compression). Compression Stockings Add-Ons Electronic Signature(s) Signed: 01/12/2023 4:08:09 PM By: [name] Entered By: [name] on 01/12/2023 14:25:18 -------------------------------------------------------------------------------- Wound Assessment Details Patient Name: Date of Service: [name], [name] 01/12/2023 2:00 PM Medical Record Number: [id number] Patient Account Number: [id number] Date of Birth/Sex: Treating RN: 1936/01/26 (87 y.o. [name] Primary Care Icesis Renn: [name] Other Clinician: Referring Jiro Kiester: Treating Chonte Ricke/Extender: [name] Weeks in Treatment: 22 Wound Status Wound Number: 5 Primary Lymphedema Etiology: Wound Location: Right, Medial Lower Leg Wound Open Wounding Event: Gradually Appeared Status: Date Acquired: 12/29/2022 Comorbid Glaucoma, Arrhythmia, Coronary Artery Disease, Hypertension, Weeks Of Treatment: 2 History: Peripheral Venous Disease, Osteoarthritis Clustered Wound: No Photos Wound Measurements Length: (cm) 4 Width: (cm) 7 Depth: (cm) 0.1 Area: (cm) 21.991 Volume: (cm) 2.199 % Reduction in Area: 72.5% % Reduction in Volume: 72.6% Epithelialization: Small (1-33%) Tunneling: No Undermining: No Wound Description Classification: Full Thickness Without Exposed Support Structures Wound Margin: Distinct, outline attached Exudate Amount: Medium Exudate Type: Serous Exudate Color: amber Foul Odor After Cleansing: No Slough/Fibrino Yes Wound Bed Granulation Amount: Medium (34-66%) Exposed Structure Granulation Quality: Red, Pink Fascia Exposed: No Necrotic Amount: Medium (34-66%) Fat Layer (Subcutaneous Tissue) Exposed: Yes Necrotic Quality: Eschar, Adherent Slough Tendon Exposed: No Muscle Exposed: No Joint Exposed: No Bone Exposed: No Periwound Skin Texture Texture Color No Abnormalities Noted: Yes No  Abnormalities Noted: No Hemosiderin Staining: [address] HAI, [name] ([id number]) [id number] Page 9 of 9 No Abnormalities Noted: No Temperature / Pain Dry / Scaly: Yes Temperature: No Abnormality Treatment Notes Wound #5 (Lower Leg) Wound Laterality: Right, Medial Cleanser Soap and Water Discharge Instruction: May shower and wash wound with dial antibacterial soap and water prior to dressing change. Wound Cleanser Discharge Instruction: Cleanse the wound with wound cleanser prior to applying a clean dressing  using gauze sponges, not tissue or cotton balls. Peri-Wound Care Sween Lotion (Moisturizing lotion) Discharge Instruction: Apply moisturizing lotion as directed Topical Primary Dressing Maxorb Extra Ag+ Alginate Dressing, 4x4.75 (in/in) Discharge Instruction: Apply to wound bed as instructed Secondary Dressing ABD Pad, 5x9 Discharge Instruction: Apply over primary dressing as directed. Woven Gauze Sponge, Non-Sterile 4x4 in Discharge Instruction: Apply over primary dressing as directed. Secured With Compression Wrap Urgo K2 Lite, two layer compression system, regular Discharge Instruction: Apply Urgo K2 Lite as directed (alternative to 3 layer compression). Compression Stockings Add-Ons Electronic Signature(s) Signed: 01/12/2023 4:08:09 PM By: [name] Entered By: [name] on 01/12/2023 14:26:00 -------------------------------------------------------------------------------- Vitals Details Patient Name: Date of Service: [name], [name] 01/12/2023 2:00 PM Medical Record Number: [id number] Patient Account Number: [id number] Date of Birth/Sex: Treating RN: 05-21-1936 (87 y.o. [name] Primary Care Lamonta Cypress: [name] Other Clinician: Referring Halle Davlin: Treating Darrielle Pflieger/Extender: [name] in Treatment: 22 Vital Signs Time Taken: 14:13 Temperature (F): 98.5 Height (in): 69 Pulse (bpm): 71 Weight (lbs): 198 Respiratory Rate (breaths/min): 18 Body Mass Index (BMI): 29.2 Blood Pressure (mmHg): 121/66 Reference Range: 80 - 120 mg / dl Electronic Signature(s) Signed: 01/12/2023 4:08:09 PM By: [name] Entered By: [name] on 01/12/2023 14:14:08

## 2023-01-13 NOTE — Progress Notes (Signed)
AZAD, CALAME ([id number]) [id number] Page 1 of 12 Visit Report for 01/12/2023 Chief Complaint Document Details Patient Name: Date of Service: [name], [name] 01/12/2023 2:00 PM Medical Record Number: [id number] Patient Account Number: [id number] Date of Birth/Sex: Treating RN: [birth year] (87 y.o. M) Primary Care Provider: Willow Ora Other Clinician: Referring Provider: Treating Provider/Extender: Cresenciano Genre in Treatment: 22 Information Obtained from: Patient Chief Complaint Left lower extremity wound Electronic Signature(s) Signed: 01/12/2023 3:16:00 PM By: Duanne Guess MD FACS Entered By: Duanne Guess on 01/12/2023 15:16:00 -------------------------------------------------------------------------------- Debridement Details Patient Name: Date of Service: [name], [name] 01/12/2023 2:00 PM Medical Record Number: [id number] Patient Account Number: [id number] Date of Birth/Sex: Treating RN: 1935-12-21 (87 y.o. [name] Primary Care Provider: Willow Ora Other Clinician: Referring Provider: Treating Provider/Extender: Cresenciano Genre in Treatment: 22 Debridement Performed for Assessment: Wound #5 Right,Medial Lower Leg Performed By: Physician Duanne Guess, MD Debridement Type: Debridement Level of Consciousness (Pre-procedure): Awake and Alert Pre-procedure Verification/Time Out Yes - 14:34 Taken: Start Time: 14:34 Pain Control: Lidocaine 4% Topical Solution Percent of Wound Bed Debrided: 100% T Area Debrided (cm): otal 21.98 Tissue and other material debrided: Non-Viable, Eschar, Slough, Slough Level: Non-Viable Tissue Debridement Description: Selective/Open Wound Instrument: Curette Bleeding: Minimum Hemostasis Achieved: Pressure Response to Treatment: Procedure was tolerated well Level of Consciousness (Post- Awake and Alert procedure): Post Debridement Measurements of Total  Wound Length: (cm) 4 Width: (cm) 7 Depth: (cm) 0.1 Volume: (cm) 2.199 Character of Wound/Ulcer Post Debridement: Improved Post Procedure Diagnosis Same as Pre-procedure Notes scribed for Dr. Lady Gary by Samuella Bruin, RN Electronic Signature(s) Signed: 01/12/2023 3:37:18 PM By: Duanne Guess MD FACS Signed: 01/12/2023 4:08:09 PM By: Samuella Bruin Entered By: Samuella Bruin on 01/12/2023 14:39:34 [name], [name] ([id number]) [id number] Page 2 of 12 -------------------------------------------------------------------------------- Debridement Details Patient Name: Date of Service: [name], [name] 01/12/2023 2:00 PM Medical Record Number: [id number] Patient Account Number: [id number] Date of Birth/Sex: Treating RN: [birth year] (87 y.o. [name] Primary Care Provider: Willow Ora Other Clinician: Referring Provider: Treating Provider/Extender: Cresenciano Genre in Treatment: 22 Debridement Performed for Assessment: Wound #3 Left,Posterior Lower Leg Performed By: Physician Duanne Guess, MD Debridement Type: Debridement Level of Consciousness (Pre-procedure): Awake and Alert Pre-procedure Verification/Time Out Yes - 14:34 Taken: Start Time: 14:34 Pain Control: Lidocaine 4% T opical Solution Percent of Wound Bed Debrided: 100% T Area Debrided (cm): otal 1.02 Tissue and other material debrided: Non-Viable, Slough, Subcutaneous, Slough Level: Skin/Subcutaneous Tissue Debridement Description: Excisional Instrument: Curette Bleeding: Minimum Hemostasis Achieved: Pressure Response to Treatment: Procedure was tolerated well Level of Consciousness (Post- Awake and Alert procedure): Post Debridement Measurements of Total Wound Length: (cm) 1.3 Width: (cm) 1 Depth: (cm) 0.1 Volume: (cm) 0.102 Character of Wound/Ulcer Post Debridement: Improved Post Procedure Diagnosis Same as Pre-procedure Notes scribed for  Dr. Lady Gary by Samuella Bruin, RN Electronic Signature(s) Signed: 01/12/2023 3:37:18 PM By: Duanne Guess MD FACS Signed: 01/12/2023 4:08:09 PM By: Samuella Bruin Entered By: Samuella Bruin on 01/12/2023 14:41:11 -------------------------------------------------------------------------------- HPI Details Patient Name: Date of Service: [name], [name] 01/12/2023 2:00 PM Medical Record Number: [id number] Patient Account Number: [id number] Date of Birth/Sex: Treating RN: 18-Dec-1935 (87 y.o. M) Primary Care Provider: Willow Ora Other Clinician: Referring Provider: Treating Provider/Extender: Cresenciano Genre in Treatment: 22 History of Present Illness HPI Description: [name] is an 87 year old male with a past medical history of CAD s/p DES to LCx 2006, carotid  artery disease, hypertension, hyperlipidemia, paroxysmal atrial fibrillation on Eliquis and Cardizem that presents today for right lower extremity wound. He was seen by Dr. Drue Novel, his primary care provider on 4/29 for this issue and was referred to our clinic. Patient states that he had a small wound that spontaneously started in October 2021 and has not healed. He states this has progressively gotten larger. He has been using acne cream prescribed by the dermatologist for this issue. He reports drainage to the wound but no purulent drainage. He reports mild soreness to the wound. He has swelling in his right leg greater than left and reports this is a chronic issue for the past 1 to 2 years. He denies resting leg pain or pain with ambulation. Of note He was prescribed doxycycline at the end of last month and has finished this course for possible skin infection to the wound site. 02/06/2021; I am seeing this patient who was admitted to the clinic last week by Dr. Mikey Bussing. He has predominantly I think a venous insufficiency wound on the right medial lower leg he has been using Santyl Hydrofera Blue under 3 layer  compression. Arterial studies were ordered last week but do not seem to been put through. He is not a diabetic. He did have venous reflux studies done in August 2020. He did have abnormal reflux time was noted in the great saphenous vein in the distal thigh and the great saphenous vein at the knee. There was no evidence of DVT or SVT at the time he was not felt to have large enough ASHTAN, GIRTMAN ([id number]) [id number] Page 3 of 12 saphenous veins on either side for intervention. Compression stockings at least knee-high were recommended. Follow-up was on a as needed basis with Dr. Randie Heinz The patient does not describe claudication but his pulses in his feet are not vibrant this could be because of the swelling 5/26; patient presents for 1 week follow-up. He has been using Hydrofera Blue under 3 layer compression. He had arterial studies done. He has no issues or complaints today. He denies signs of infection. He has his juxta light compressions with him today. 6/3; patient presents for 1 week follow-up. He has been using Hydrofera Blue under 3 layer compression. He denies signs and symptoms of infection. He did have bright green drainage which he states he has not seen before when the dressing was taken off today. He is using his juxta light compression to the left leg. 6/10; patient presents for 1 week follow-up. He has been tolerating Hydrofera Blue with 3 layer compression. He again reports bright green drainage. However he denies any signs of infection. He has been using juxta light compression to the left leg. 6/24; patient presents for 2-week follow-up. He has been tolerating Hydrofera Blue with 3 layer compression. Today he is healed. He brought his juxta lite compression for the right leg and continues to wear the juxta light compression on the left leg READMISSION 05/05/2022 The patient returns to clinic today with a new wound on his left posterior leg. He has  been wearing his juxta lite stockings on a regular basis, but on exam he still has 3+ pitting edema. I suspect he has lymphedema in addition to his known venous reflux. The wounds are scattered geographic wounds with light slough accumulation. They have been applying mupirocin and CeraVe ointment. He does have a VP shunt placement scheduled for next week, but it sounds like his neurosurgeon is not aware of the fact that  he has an open wound. 05/11/2022: His VP shunt placement has been postponed while we get his wound to heal. The wounds are all smaller today with just a little eschar and minimal slough. Edema control is good. No concern for infection. 05/18/2022: All of the open areas have contracted and there is good perimeter epithelialization. There is a little bit of eschar and slough on the open areas. Good edema control. No concern for infection. 05/26/2022: Many of the small open areas have closed. There is Hydrofera Blue sponge stuck in a number of places and this is unable to be removed by the intake nurse. There is a fair amount of eschar and a little bit of slough present. 06/02/2022: Continued closure of small open skin sites. There are just a couple remaining and these have a little eschar on the surface. Edema control is excellent. 06/09/2022: We are down to just 1 small open area. There is some eschar and slough present. No concern for infection. Edema control is excellent. 06/16/2022: His wound is healed. READMISSION 08/11/2022 Mr. Short underwent a successful VP shunt placement. He returns to clinic today because of a suspicious lesion on his left posterior calf. He actually has no open wounds and his edema control is excellent. He is wearing his juxta lite stockings religiously. On his left posterior calf, there is a raised scaly lesion concerning for potential squamous cell carcinoma. 08/25/2022: The biopsy that I took at his last visit was consistent with a well-differentiated squamous  cell carcinoma. He has an appointment at the skin surgery center on December 14. The wound from the biopsy has some slough accumulation, but no concern for infection. 11/06/2022: Since his last visit here, he has undergone Mohs surgery for the squamous cell carcinoma that I biopsied. He has a fairly substantial and deep defect. Apparently the procedure took place between 10 to 12 days ago and he has been in an [employer] ever since. When his Unna boot was removed, he was found to have crusting on his anterior tibial surface with small superficial wounds underneath. 11/16/2022: The anterior tibial surface wounds seem to largely be closed with just a couple of tiny open areas under some eschar. The Mohs defect is quite a bit cleaner, but still with nonviable subcutaneous tissue present along with slough and eschar, much of which is dried up Iodoflex. 11/24/2022: The anterior tibial wounds are closed. The Mohs defect is cleaner again this week with much less nonviable tissue. 12/11/2022: The Mohs defect continues to contract. There is good granulation tissue underneath a layer of dried up Iodoflex and some slough. 12/21/2022: The wound is smaller again this week, as well as shallower. There is a little bit of slough on the surface. 12/29/2022: His right leg has become massively swollen and he has had skin breakdown occur as a result with a lot of serous drainage. He has not been particularly compliant with compression garment wear, nor has he been elevating his legs. The wound on his left leg is smaller and edema control on the side is very good. 01/07/2023: He has built up some slough and eschar on the right leg, but edema control has improved markedly. The wound on his left leg is much smaller again today with just a little slough on the surface. 01/12/2023: Both legs have improved. There is less open area on the right leg, but there is still slough and eschar accumulation. The left leg wound is smaller again  today, but still has slough buildup.  Electronic Signature(s) Signed: 01/12/2023 3:16:35 PM By: Duanne Guess MD FACS Entered By: Duanne Guess on 01/12/2023 15:16:35 -------------------------------------------------------------------------------- Physical Exam Details Patient Name: Date of Service: [name], [name] 01/12/2023 2:00 PM Medical Record Number: [id number] Patient Account Number: [id number] Date of Birth/Sex: Treating RN: 11/16/1935 (87 y.o. M) Primary Care Provider: Willow Ora Other Clinician: Referring Provider: Treating Provider/Extender: Elsie Saas [place], [place] E ([id number]) [id number] Page 4 of 12 Weeks in Treatment: 22 Constitutional . . . . no acute distress. Respiratory Normal work of breathing on room air. Notes 01/12/2023: Both legs have improved. There is less open area on the right leg, but there is still slough and eschar accumulation. The left leg wound is smaller again today, but still has slough buildup. Electronic Signature(s) Signed: 01/12/2023 3:17:11 PM By: Duanne Guess MD FACS Entered By: Duanne Guess on 01/12/2023 15:17:11 -------------------------------------------------------------------------------- Physician Orders Details Patient Name: Date of Service: [name], [name] 01/12/2023 2:00 PM Medical Record Number: [id number] Patient Account Number: [id number] Date of Birth/Sex: Treating RN: [birth year] (87 y.o. [name] Primary Care Provider: Willow Ora Other Clinician: Referring Provider: Treating Provider/Extender: Cresenciano Genre in Treatment: 22 Verbal / Phone Orders: No Diagnosis Coding ICD-10 Coding Code Description [phone] Non-pressure chronic ulcer of left calf with fat layer exposed L97.811 Non-pressure chronic ulcer of other part of right lower leg limited to breakdown of skin L98.9 Disorder of the skin and subcutaneous tissue, unspecified I87.2  Venous insufficiency (chronic) (peripheral) I10 Essential (primary) hypertension Follow-up Appointments ppointment in 1 week. - Dr. Lady Gary - Room 2 Return A Anesthetic (In clinic) Topical Lidocaine 4% applied to wound bed Bathing/ Shower/ Hygiene May shower with protection but do not get wound dressing(s) wet. Protect dressing(s) with water repellant cover (for example, large plastic bag) or a cast cover and may then take shower. Edema Control - Lymphedema / SCD / Other Elevate legs to the level of the heart or above for 30 minutes daily and/or when sitting for 3-4 times a day throughout the day. - Elevate legs throughout the day. When not walking, elevate legs. Avoid standing for long periods of time. Exercise regularly - including ankle circles while sitting Moisturize legs daily. - both legs nightly after removing juxtalites Compression stocking or Garment 20-30 mm/Hg pressure to: - both legs daily Wound Treatment Wound #3 - Lower Leg Wound Laterality: Left, Posterior Cleanser: Soap and Water 1 x Per Day/30 Days Discharge Instructions: May shower and wash wound with dial antibacterial soap and water prior to dressing change. Cleanser: Wound Cleanser 1 x Per Day/30 Days Discharge Instructions: Cleanse the wound with wound cleanser prior to applying a clean dressing using gauze sponges, not tissue or cotton balls. Peri-Wound Care: Sween Lotion (Moisturizing lotion) 1 x Per Day/30 Days Discharge Instructions: Apply moisturizing lotion as directed Prim Dressing: Promogran Prisma Matrix, 4.34 (sq in) (silver collagen) 1 x Per Day/30 Days ary Discharge Instructions: Moisten collagen with saline or hydrogel Secondary Dressing: ABD Pad, 5x9 1 x Per Day/30 Days Discharge Instructions: Apply over primary dressing as directed. [name], [name] ([id number]) [id number] Page 5 of 12 Secondary Dressing: Woven Gauze Sponge, Non-Sterile 4x4 in 1 x Per Day/30  Days Discharge Instructions: Apply over primary dressing as directed. Compression Wrap: Urgo K2 Lite, two layer compression system, regular 1 x Per Day/30 Days Discharge Instructions: Apply Urgo K2 Lite as directed (alternative to 3 layer compression). Wound #5 - Lower Leg Wound Laterality: Right, Medial Cleanser: Soap and Water  1 x Per Day/30 Days Discharge Instructions: May shower and wash wound with dial antibacterial soap and water prior to dressing change. Cleanser: Wound Cleanser 1 x Per Day/30 Days Discharge Instructions: Cleanse the wound with wound cleanser prior to applying a clean dressing using gauze sponges, not tissue or cotton balls. Peri-Wound Care: Sween Lotion (Moisturizing lotion) 1 x Per Day/30 Days Discharge Instructions: Apply moisturizing lotion as directed Prim Dressing: Maxorb Extra Ag+ Alginate Dressing, 4x4.75 (in/in) 1 x Per Day/30 Days ary Discharge Instructions: Apply to wound bed as instructed Secondary Dressing: ABD Pad, 5x9 1 x Per Day/30 Days Discharge Instructions: Apply over primary dressing as directed. Secondary Dressing: Woven Gauze Sponge, Non-Sterile 4x4 in 1 x Per Day/30 Days Discharge Instructions: Apply over primary dressing as directed. Compression Wrap: Urgo K2 Lite, two layer compression system, regular 1 x Per Day/30 Days Discharge Instructions: Apply Urgo K2 Lite as directed (alternative to 3 layer compression). Patient Medications llergies: No Known Allergies A Notifications Medication Indication Start End 01/12/2023 lidocaine DOSE topical 4 % cream - cream topical Electronic Signature(s) Signed: 01/12/2023 3:37:18 PM By: Duanne Guess MD FACS Entered By: Duanne Guess on 01/12/2023 15:17:27 -------------------------------------------------------------------------------- Problem List Details Patient Name: Date of Service: [name], [name] 01/12/2023 2:00 PM Medical Record Number: [id number] Patient Account Number:  [id number] Date of Birth/Sex: Treating RN: 01/19/1936 (87 y.o. M) Primary Care Provider: Willow Ora Other Clinician: Referring Provider: Treating Provider/Extender: Elsie Saas Weeks in Treatment: 22 Active Problems ICD-10 Encounter Code Description Active Date MDM Diagnosis L97.222 Non-pressure chronic ulcer of left calf with fat layer exposed 11/06/2022 No Yes L97.811 Non-pressure chronic ulcer of other part of right lower leg limited to breakdown 12/29/2022 No Yes of skin L98.9 Disorder of the skin and subcutaneous tissue, unspecified 08/11/2022 No Yes I87.2 Venous insufficiency (chronic) (peripheral) 08/11/2022 No Yes ABDIEL, BLACKERBY ([id number]) [id number] Page 6 of 12 I10 Essential (primary) hypertension 08/11/2022 No Yes Inactive Problems Resolved Problems ICD-10 Code Description Active Date Resolved Date L97.821 Non-pressure chronic ulcer of other part of left lower leg limited to breakdown of skin 11/06/2022 11/06/2022 Electronic Signature(s) Signed: 01/12/2023 3:15:38 PM By: Duanne Guess MD FACS Entered By: Duanne Guess on 01/12/2023 15:15:38 -------------------------------------------------------------------------------- Progress Note Details Patient Name: Date of Service: [name], [name] 01/12/2023 2:00 PM Medical Record Number: [id number] Patient Account Number: [id number] Date of Birth/Sex: Treating RN: November 02, 1935 (87 y.o. M) Primary Care Provider: Willow Ora Other Clinician: Referring Provider: Treating Provider/Extender: Cresenciano Genre in Treatment: 22 Subjective Chief Complaint Information obtained from Patient Left lower extremity wound History of Present Illness (HPI) [name] is an 87 year old male with a past medical history of CAD s/p DES to LCx 2006, carotid artery disease, hypertension, hyperlipidemia, paroxysmal atrial fibrillation on Eliquis and Cardizem that presents today for right  lower extremity wound. He was seen by Dr. Drue Novel, his primary care provider on 4/29 for this issue and was referred to our clinic. Patient states that he had a small wound that spontaneously started in October 2021 and has not healed. He states this has progressively gotten larger. He has been using acne cream prescribed by the dermatologist for this issue. He reports drainage to the wound but no purulent drainage. He reports mild soreness to the wound. He has swelling in his right leg greater than left and reports this is a chronic issue for the past 1 to 2 years. He denies resting leg pain or pain with ambulation. Of note He  was prescribed doxycycline at the end of last month and has finished this course for possible skin infection to the wound site. 02/06/2021; I am seeing this patient who was admitted to the clinic last week by Dr. Mikey Bussing. He has predominantly I think a venous insufficiency wound on the right medial lower leg he has been using Santyl Hydrofera Blue under 3 layer compression. Arterial studies were ordered last week but do not seem to been put through. He is not a diabetic. He did have venous reflux studies done in August 2020. He did have abnormal reflux time was noted in the great saphenous vein in the distal thigh and the great saphenous vein at the knee. There was no evidence of DVT or SVT at the time he was not felt to have large enough saphenous veins on either side for intervention. Compression stockings at least knee-high were recommended. Follow-up was on a as needed basis with Dr. Randie Heinz The patient does not describe claudication but his pulses in his feet are not vibrant this could be because of the swelling 5/26; patient presents for 1 week follow-up. He has been using Hydrofera Blue under 3 layer compression. He had arterial studies done. He has no issues or complaints today. He denies signs of infection. He has his juxta light compressions with him today. 6/3; patient  presents for 1 week follow-up. He has been using Hydrofera Blue under 3 layer compression. He denies signs and symptoms of infection. He did have bright green drainage which he states he has not seen before when the dressing was taken off today. He is using his juxta light compression to the left leg. 6/10; patient presents for 1 week follow-up. He has been tolerating Hydrofera Blue with 3 layer compression. He again reports bright green drainage. However he denies any signs of infection. He has been using juxta light compression to the left leg. 6/24; patient presents for 2-week follow-up. He has been tolerating Hydrofera Blue with 3 layer compression. Today he is healed. He brought his juxta lite compression for the right leg and continues to wear the juxta light compression on the left leg READMISSION 05/05/2022 The patient returns to clinic today with a new wound on his left posterior leg. He has been wearing his juxta lite stockings on a regular basis, but on exam he still has 3+ pitting edema. I suspect he has lymphedema in addition to his known venous reflux. The wounds are scattered geographic wounds with light slough accumulation. They have been applying mupirocin and CeraVe ointment. He does have a VP shunt placement scheduled for next week, but it sounds like his neurosurgeon is not aware of the fact that he has an open wound. 05/11/2022: His VP shunt placement has been postponed while we get his wound to heal. The wounds are all smaller today with just a little eschar and minimal slough. Edema control is good. No concern for infection. RAEKWON, WINKOWSKI ([id number]) [id number] Page 7 of 12 05/18/2022: All of the open areas have contracted and there is good perimeter epithelialization. There is a little bit of eschar and slough on the open areas. Good edema control. No concern for infection. 05/26/2022: Many of the small open areas have closed. There is Hydrofera  Blue sponge stuck in a number of places and this is unable to be removed by the intake nurse. There is a fair amount of eschar and a little bit of slough present. 06/02/2022: Continued closure of small open skin sites. There are  just a couple remaining and these have a little eschar on the surface. Edema control is excellent. 06/09/2022: We are down to just 1 small open area. There is some eschar and slough present. No concern for infection. Edema control is excellent. 06/16/2022: His wound is healed. READMISSION 08/11/2022 Mr. Short underwent a successful VP shunt placement. He returns to clinic today because of a suspicious lesion on his left posterior calf. He actually has no open wounds and his edema control is excellent. He is wearing his juxta lite stockings religiously. On his left posterior calf, there is a raised scaly lesion concerning for potential squamous cell carcinoma. 08/25/2022: The biopsy that I took at his last visit was consistent with a well-differentiated squamous cell carcinoma. He has an [facility]. The wound from the biopsy has some slough accumulation, but no concern for infection. 11/06/2022: Since his last visit here, he has undergone Mohs surgery for the squamous cell carcinoma that I biopsied. He has a fairly substantial and deep defect. Apparently the procedure took place between 10 to 12 days ago and he has been in an [employer] ever since. When his Unna boot was removed, he was found to have crusting on his anterior tibial surface with small superficial wounds underneath. 11/16/2022: The anterior tibial surface wounds seem to largely be closed with just a couple of tiny open areas under some eschar. The Mohs defect is quite a bit cleaner, but still with nonviable subcutaneous tissue present along with slough and eschar, much of which is dried up Iodoflex. 11/24/2022: The anterior tibial wounds are closed. The Mohs defect is cleaner  again this week with much less nonviable tissue. 12/11/2022: The Mohs defect continues to contract. There is good granulation tissue underneath a layer of dried up Iodoflex and some slough. 12/21/2022: The wound is smaller again this week, as well as shallower. There is a little bit of slough on the surface. 12/29/2022: His right leg has become massively swollen and he has had skin breakdown occur as a result with a lot of serous drainage. He has not been particularly compliant with compression garment wear, nor has he been elevating his legs. The wound on his left leg is smaller and edema control on the side is very good. 01/07/2023: He has built up some slough and eschar on the right leg, but edema control has improved markedly. The wound on his left leg is much smaller again today with just a little slough on the surface. 01/12/2023: Both legs have improved. There is less open area on the right leg, but there is still slough and eschar accumulation. The left leg wound is smaller again today, but still has slough buildup. Patient History Information obtained from Patient. Family History Cancer - Father, Diabetes - Maternal Grandparents, Heart Disease - Maternal Grandparents, Hypertension - Maternal Grandparents, No family history of Hereditary Spherocytosis, Kidney Disease, Lung Disease, Seizures, Stroke, Thyroid Problems, Tuberculosis. Social History Former smoker - Quit over 30 years ago, Marital Status - Married, Alcohol Use - Rarely, Drug Use - No History, Caffeine Use - Daily. Medical History Eyes Patient has history of Glaucoma Cardiovascular Patient has history of Arrhythmia - afib, Coronary Artery Disease, Hypertension, Peripheral Venous Disease Endocrine Denies history of Type I Diabetes, Type II Diabetes Genitourinary Denies history of End Stage Renal Disease Integumentary (Skin) Denies history of History of Burn Musculoskeletal Patient has history of  Osteoarthritis Oncologic Denies history of Received Chemotherapy, Received Radiation Psychiatric Denies  history of Anorexia/bulimia, Confinement Anxiety Hospitalization/Surgery History - lumbar laminectomy. - total knee arthropathy right. - bil total hip arthroplasty. - coronary stents. - toe surgery. - tonsillectomy. - shunt insertion. Medical A Surgical History Notes nd Gastrointestinal colon polyps Endocrine Hypothyroidism Genitourinary BPH Musculoskeletal Bilat Hip Replacements, Right Knee Replacement, lumbar stenosis Neurologic hydrocephalus Oncologic Skin Cancer multiple sites MAHMOOD, BOEHRINGER ([id number]) [id number] Page 8 of 12 Objective Constitutional no acute distress. Vitals Time Taken: 2:13 PM, Height: 69 in, Weight: 198 lbs, BMI: 29.2, Temperature: 98.5 F, Pulse: 71 bpm, Respiratory Rate: 18 breaths/min, Blood Pressure: 121/66 mmHg. Respiratory Normal work of breathing on room air. General Notes: 01/12/2023: Both legs have improved. There is less open area on the right leg, but there is still slough and eschar accumulation. The left leg wound is smaller again today, but still has slough buildup. Integumentary (Hair, Skin) Wound #3 status is Open. Original cause of wound was Puncture. The date acquired was: 08/12/2022. The wound has been in treatment 20 weeks. The wound is located on the Left,Posterior Lower Leg. The wound measures 1.3cm length x 1cm width x 0.1cm depth; 1.021cm^2 area and 0.102cm^3 volume. There is Fat Layer (Subcutaneous Tissue) exposed. There is no tunneling or undermining noted. There is a medium amount of serosanguineous drainage noted. The wound margin is distinct with the outline attached to the wound base. There is large (67-100%) red granulation within the wound bed. There is a small (1-33%) amount of necrotic tissue within the wound bed including Adherent Slough. The periwound skin appearance had no abnormalities  noted for texture. The periwound skin appearance had no abnormalities noted for moisture. The periwound skin appearance exhibited: Hemosiderin Staining. Periwound temperature was noted as No Abnormality. Wound #5 status is Open. Original cause of wound was Gradually Appeared. The date acquired was: 12/29/2022. The wound has been in treatment 2 weeks. The wound is located on the Right,Medial Lower Leg. The wound measures 4cm length x 7cm width x 0.1cm depth; 21.991cm^2 area and 2.199cm^3 volume. There is Fat Layer (Subcutaneous Tissue) exposed. There is no tunneling or undermining noted. There is a medium amount of serous drainage noted. The wound margin is distinct with the outline attached to the wound base. There is medium (34-66%) red, pink granulation within the wound bed. There is a medium (34-66%) amount of necrotic tissue within the wound bed including Eschar and Adherent Slough. The periwound skin appearance had no abnormalities noted for texture. The periwound skin appearance exhibited: Dry/Scaly, Hemosiderin Staining. Periwound temperature was noted as No Abnormality. 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 There was a Excisional Skin/Subcutaneous Tissue Debridement with a total area of 1.02 sq cm performed by Duanne Guess, MD. With the following instrument(s): Curette to remove Non-Viable tissue/material. Material removed includes Subcutaneous Tissue and Slough and after achieving pain control using Lidocaine 4% T opical Solution. No specimens were taken. A time out was conducted at 14:34, prior to  the start of the procedure. A Minimum amount of bleeding was controlled with Pressure. The procedure was tolerated well. Post Debridement Measurements: 1.3cm length x 1cm width x 0.1cm depth; 0.102cm^3 volume. Character of Wound/Ulcer Post Debridement is improved. Post procedure Diagnosis  Wound #3: Same as Pre-Procedure General Notes: scribed for Dr. Lady Gary by Samuella Bruin, RN. Pre-procedure diagnosis of Wound #3 is a Lesion located on the Left,Posterior Lower Leg . There was a Double Layer Compression Therapy Procedure by Samuella Bruin, RN. Post procedure Diagnosis Wound #3: Same as Pre-Procedure Wound #5 Pre-procedure diagnosis of Wound #5 is a Lymphedema located on the Right,Medial Lower Leg . There was a Selective/Open Wound Non-Viable Tissue Debridement with a total area of 21.98 sq cm performed by Duanne Guess, MD. With the following instrument(s): Curette to remove Non-Viable tissue/material. Material removed includes Eschar and Slough and after achieving pain control using Lidocaine 4% Topical Solution. No specimens were taken. A time out was conducted at 14:34, prior to the start of the procedure. A Minimum amount of bleeding was controlled with Pressure. The procedure was tolerated well. Post Debridement Measurements: 4cm length x 7cm width x 0.1cm depth; 2.199cm^3 volume. Character of Wound/Ulcer Post Debridement is improved. Post procedure Diagnosis Wound #5: Same as Pre-Procedure General Notes: scribed for Dr. Lady Gary by Samuella Bruin, RN. [name], [name] E ([id number]) [id number] Page 9 of 12 Pre-procedure diagnosis of Wound #5 is a Lymphedema located on the Right,Medial Lower Leg . There was a Double Layer Compression Therapy Procedure by Samuella Bruin, RN. Post procedure Diagnosis Wound #5: Same as Pre-Procedure Plan Follow-up Appointments: Return Appointment in 1 week. - Dr. Lady Gary - Room 2 Anesthetic: (In clinic) Topical  Lidocaine 4% applied to wound bed Bathing/ Shower/ Hygiene: May shower with protection but do not get wound dressing(s) wet. Protect dressing(s) with water repellant cover (for example, large plastic bag) or a cast cover and may then take shower. Edema Control - Lymphedema / SCD / Other: Elevate legs to the level of the heart or above for 30 minutes daily and/or when sitting for 3-4 times a day throughout the day. - Elevate legs throughout the day. When not walking, elevate legs. Avoid standing for long periods of time. Exercise regularly - including ankle circles while sitting Moisturize legs daily. - both legs nightly after removing juxtalites Compression stocking or Garment 20-30 mm/Hg pressure to: - both legs daily The following medication(s) was prescribed: lidocaine topical 4 % cream cream topical was prescribed at facility WOUND #3: - Lower Leg Wound Laterality: Left, Posterior Cleanser: Soap and Water 1 x Per Day/30 Days Discharge Instructions: May shower and wash wound with dial antibacterial soap and water prior to dressing change. Cleanser: Wound Cleanser 1 x Per Day/30 Days Discharge Instructions: Cleanse the wound with wound cleanser prior to applying a clean dressing using gauze sponges, not tissue or cotton balls. Peri-Wound Care: Sween Lotion (Moisturizing lotion) 1 x Per Day/30 Days Discharge Instructions: Apply moisturizing lotion as directed Prim Dressing: Promogran Prisma Matrix, 4.34 (sq in) (silver collagen) 1 x Per Day/30 Days ary Discharge Instructions: Moisten collagen with saline or hydrogel Secondary Dressing: ABD Pad, 5x9 1 x Per Day/30 Days Discharge Instructions: Apply over primary dressing as directed. Secondary Dressing: Woven Gauze Sponge, Non-Sterile 4x4 in 1 x Per Day/30 Days Discharge Instructions: Apply over primary dressing as directed. Com pression Wrap: Urgo K2 Lite, two layer compression system, regular 1 x Per Day/30 Days Discharge Instructions:  Apply Urgo K2 Lite as directed (alternative to 3 layer compression). WOUND #5: - Lower Leg Wound Laterality: Right, Medial Cleanser: Soap and Water 1 x Per Day/30 Days Discharge Instructions: May shower and wash wound with dial antibacterial soap and water prior to dressing change. Cleanser: Wound Cleanser 1 x Per  Day/30 Days Discharge Instructions: Cleanse the wound with wound cleanser prior to applying a clean dressing using gauze sponges, not tissue or cotton balls. Peri-Wound Care: Sween Lotion (Moisturizing lotion) 1 x Per Day/30 Days Discharge Instructions: Apply moisturizing lotion as directed Prim Dressing: Maxorb Extra Ag+ Alginate Dressing, 4x4.75 (in/in) 1 x Per Day/30 Days ary Discharge Instructions: Apply to wound bed as instructed Secondary Dressing: ABD Pad, 5x9 1 x Per Day/30 Days Discharge Instructions: Apply over primary dressing as directed. Secondary Dressing: Woven Gauze Sponge, Non-Sterile 4x4 in 1 x Per Day/30 Days Discharge Instructions: Apply over primary dressing as directed. Com pression Wrap: Urgo K2 Lite, two layer compression system, regular 1 x Per Day/30 Days Discharge Instructions: Apply Urgo K2 Lite as directed (alternative to 3 layer compression). 01/12/2023: Both legs have improved. There is less open area on the right leg, but there is still slough and eschar accumulation. The left leg wound is smaller again today, but still has slough buildup. I used a curette to debride slough and eschar from the right leg and slough and subcutaneous tissue from the left leg. We will continue silver alginate on the right and Prisma silver collagen on the left. Bilateral 3 layer compression equivalent. Follow-up in 1 week. Electronic Signature(s) Signed: 01/12/2023 3:19:18 PM By: Duanne Guess MD FACS Entered By: Duanne Guess on 01/12/2023 15:19:18 -------------------------------------------------------------------------------- HxROS Details Patient Name: Date of  Service: [name], [name] 01/12/2023 2:00 PM Medical Record Number: [id number] Patient Account Number: [id number] Date of Birth/Sex: Treating RN: 1936/01/15 (87 y.o. M) Primary Care Provider: Willow Ora Other Clinician: Referring Provider: Treating Provider/Extender: Cresenciano Genre in Treatment: [address], [place] ([id number]) [id number] Page 10 of 12 Information Obtained From Patient Eyes Medical History: Positive for: Glaucoma Cardiovascular Medical History: Positive for: Arrhythmia - afib; Coronary Artery Disease; Hypertension; Peripheral Venous Disease Gastrointestinal Medical History: Past Medical History Notes: colon polyps Endocrine Medical History: Negative for: Type I Diabetes; Type II Diabetes Past Medical History Notes: Hypothyroidism Genitourinary Medical History: Negative for: End Stage Renal Disease Past Medical History Notes: BPH Integumentary (Skin) Medical History: Negative for: History of Burn Musculoskeletal Medical History: Positive for: Osteoarthritis Past Medical History Notes: Bilat Hip Replacements, Right Knee Replacement, lumbar stenosis Neurologic Medical History: Past Medical History Notes: hydrocephalus Oncologic Medical History: Negative for: Received Chemotherapy; Received Radiation Past Medical History Notes: Skin Cancer multiple sites Psychiatric Medical History: Negative for: Anorexia/bulimia; Confinement Anxiety HBO Extended History Items Eyes: Glaucoma Immunizations Pneumococcal Vaccine: Received Pneumococcal Vaccination: Yes Received Pneumococcal Vaccination On or After 60th Birthday: Yes Implantable Devices None Hospitalization / Surgery History Type of Hospitalization/Surgery lumbar laminectomy ARAD, BURSTON ([id number]) [id number] Page 11 of 12 total knee arthropathy right bil total hip arthroplasty coronary stents toe  surgery tonsillectomy shunt insertion Family and Social History Cancer: Yes - Father; Diabetes: Yes - Maternal Grandparents; Heart Disease: Yes - Maternal Grandparents; Hereditary Spherocytosis: No; Hypertension: Yes - Maternal Grandparents; Kidney Disease: No; Lung Disease: No; Seizures: No; Stroke: No; Thyroid Problems: No; Tuberculosis: No; Former smoker - Quit over 30 years ago; Marital Status - Married; Alcohol Use: Rarely; Drug Use: No History; Caffeine Use: Daily; Financial Concerns: No; Food, Clothing or Shelter Needs: No; Support System Lacking: No; Transportation Concerns: No Psychologist, prison and probation services) Signed: 01/12/2023 3:37:18 PM By: Duanne Guess MD FACS Entered By: Duanne Guess on 01/12/2023 15:16:45 -------------------------------------------------------------------------------- SuperBill Details Patient Name: Date of Service: [name], [name] 01/12/2023 Medical Record Number: [id number] Patient Account Number: [id number] Date of Birth/Sex:  Treating RN: [birth year] (87 y.o. M) Primary Care Provider: Willow Ora Other Clinician: Referring Provider: Treating Provider/Extender: Elsie Saas Weeks in Treatment: 22 Diagnosis Coding ICD-10 Codes Code Description [phone] Non-pressure chronic ulcer of left calf with fat layer exposed L97.811 Non-pressure chronic ulcer of other part of right lower leg limited to breakdown of skin L98.9 Disorder of the skin and subcutaneous tissue, unspecified I87.2 Venous insufficiency (chronic) (peripheral) I10 Essential (primary) hypertension Facility Procedures : CPT4 Code: [id number] Description: 11042 - DEB SUBQ TISSUE 20 SQ CM/< ICD-10 Diagnosis Description L97.222 Non-pressure chronic ulcer of left calf with fat layer exposed Modifier: Quantity: 1 : CPT4 Code: [id number] Description: 97597 - DEBRIDE WOUND 1ST 20 SQ CM OR < ICD-10 Diagnosis Description L97.811 Non-pressure chronic ulcer of other part of right lower leg  limited to breakdown Modifier: of skin Quantity: 1 : CPT4 Code: [id number] Description: 97598 - DEBRIDE WOUND EA ADDL 20 SQ CM ICD-10 Diagnosis Description L97.811 Non-pressure chronic ulcer of other part of right lower leg limited to breakdown Modifier: of skin Quantity: 1 Physician Procedures : CPT4 Code Description Modifier [id number] 99213 - WC PHYS LEVEL 3 - EST PT 25 ICD-10 Diagnosis Description L97.222 Non-pressure chronic ulcer of left calf with fat layer exposed L97.811 Non-pressure chronic ulcer of other part of right lower leg limited  to breakdown of skin L98.9 Disorder of the skin and subcutaneous tissue, unspecified I87.2 Venous insufficiency (chronic) (peripheral) Quantity: 1 : [id number] 11042 - WC PHYS SUBQ TISS 20 SQ CM ICD-10 Diagnosis Description L97.222 Non-pressure chronic ulcer of left calf with fat layer exposed Quantity: 1 : [id number] 97597 - WC PHYS DEBR WO ANESTH 20 SQ CM ICD-10 Diagnosis Description BINGHAM, MILLETTE ([id number]) [id number] L97.811 Non-pressure chronic ulcer of other part of right lower leg limited to breakdown of skin Quantity: 1 pdf Page 12 of 12 : [id number] 97598 - WC PHYS DEBR WO ANESTH EA ADD 20 CM 1 ICD-10 Diagnosis Description L97.811 Non-pressure chronic ulcer of other part of right lower leg limited to breakdown of skin Quantity: Electronic Signature(s) Signed: 01/12/2023 3:19:51 PM By: Duanne Guess MD FACS Entered By: Duanne Guess on 01/12/2023 15:19:51

## 2023-01-15 ENCOUNTER — Ambulatory Visit (INDEPENDENT_AMBULATORY_CARE_PROVIDER_SITE_OTHER): Payer: Medicare Other | Admitting: Internal Medicine

## 2023-01-15 ENCOUNTER — Encounter: Payer: Self-pay | Admitting: Internal Medicine

## 2023-01-15 VITALS — BP 138/82 | HR 75 | Temp 97.7°F | Resp 18 | Ht 69.0 in | Wt 199.5 lb

## 2023-01-15 DIAGNOSIS — I10 Essential (primary) hypertension: Secondary | ICD-10-CM

## 2023-01-15 DIAGNOSIS — Z0000 Encounter for general adult medical examination without abnormal findings: Secondary | ICD-10-CM | POA: Diagnosis not present

## 2023-01-15 DIAGNOSIS — E039 Hypothyroidism, unspecified: Secondary | ICD-10-CM | POA: Diagnosis not present

## 2023-01-15 DIAGNOSIS — R739 Hyperglycemia, unspecified: Secondary | ICD-10-CM

## 2023-01-15 DIAGNOSIS — I779 Disorder of arteries and arterioles, unspecified: Secondary | ICD-10-CM | POA: Diagnosis not present

## 2023-01-15 LAB — CBC WITH DIFFERENTIAL/PLATELET
Basophils Absolute: 0.1 10*3/uL (ref 0.0–0.1)
Basophils Relative: 1.4 % (ref 0.0–3.0)
Eosinophils Absolute: 0.5 10*3/uL (ref 0.0–0.7)
Eosinophils Relative: 6.1 % — ABNORMAL HIGH (ref 0.0–5.0)
HCT: 43.3 % (ref 39.0–52.0)
Hemoglobin: 14.4 g/dL (ref 13.0–17.0)
Lymphocytes Relative: 16.5 % (ref 12.0–46.0)
Lymphs Abs: 1.3 10*3/uL (ref 0.7–4.0)
MCHC: 33.2 g/dL (ref 30.0–36.0)
MCV: 90.6 fl (ref 78.0–100.0)
Monocytes Absolute: 1 10*3/uL (ref 0.1–1.0)
Monocytes Relative: 12.5 % — ABNORMAL HIGH (ref 3.0–12.0)
Neutro Abs: 5.2 10*3/uL (ref 1.4–7.7)
Neutrophils Relative %: 63.5 % (ref 43.0–77.0)
Platelets: 337 10*3/uL (ref 150.0–400.0)
RBC: 4.78 Mil/uL (ref 4.22–5.81)
RDW: 15.6 % — ABNORMAL HIGH (ref 11.5–15.5)
WBC: 8.2 10*3/uL (ref 4.0–10.5)

## 2023-01-15 LAB — BASIC METABOLIC PANEL
BUN: 22 mg/dL (ref 6–23)
CO2: 27 mEq/L (ref 19–32)
Calcium: 9.5 mg/dL (ref 8.4–10.5)
Chloride: 103 mEq/L (ref 96–112)
Creatinine, Ser: 1.05 mg/dL (ref 0.40–1.50)
GFR: 65.04 mL/min (ref >60.00)
Glucose, Bld: 72 mg/dL (ref 70–99)
Potassium: 4.2 mEq/L (ref 3.5–5.1)
Sodium: 140 mEq/L (ref 135–145)

## 2023-01-15 LAB — TSH: TSH: 6.3 u[IU]/mL — ABNORMAL HIGH (ref 0.35–5.50)

## 2023-01-15 LAB — HEMOGLOBIN A1C: Hgb A1c MFr Bld: 6.6 % — ABNORMAL HIGH (ref 4.6–6.5)

## 2023-01-15 NOTE — Progress Notes (Unsigned)
Subjective:    Patient ID: [name], male    DOB: 11/27/1935, 87 y.o.   MRN: [id number]  DOS:  01/15/2023 Type of visit - description: cpx, here with his wife. In general feels well. Ambulatory BPs are good. He specifically denies fever, chest pain, difficulty breathing. No blood in the stools or in the urine. Saw cardiology, note reviewed    Review of Systems See above   Past Medical History:  Diagnosis Date   A-fib [facility])    Arthritis    BPH (benign prostatic hypertrophy)    Carotid artery disease (HCC)    Doppler, December 08, 2011, 00 39% R. ICA, 40-59% LICA, followup 1 year   Coronary artery disease    DES Circumflex or CVA 2006  /  clear, February, 2011, EF 70%, no ischemia or   Ejection fraction    EF 60%, echo, 2009, mildly calcified aortic leaflets   History of colonic polyps    Hyperlipidemia    Hypertension    Hypothyroidism    Lumbar radiculopathy    Multiple thyroid nodules    Avascular echogenic areas noted in the right thyroid at the time of carotid Doppler.    PONV (postoperative nausea and vomiting)    "nausea with first hip surgery"   Skin cancer (melanoma) (HCC)    PMH of    Spinal stenosis, lumbar    Tremor    Fine tremor right upper extremity   Venous insufficiency     Past Surgical History:  Procedure Laterality Date   COLONOSCOPY     CORONARY ANGIOPLASTY WITH STENT PLACEMENT  2006   x 2 stents; DES to CX and dRCA '06   KNEE SURGERY  1970's   "chipped bone"   LUMBAR LAMINECTOMY/DECOMPRESSION MICRODISCECTOMY Left 07/13/2016   Procedure: LUMBAR LAMINECTOMY AND FORAMINOTOMY Lumbar two, three, Lumbar three-four, Lumbar four-five, LEFT Lumbar five-Sacral one DISECTOMY;  Surgeon: Tressie Stalker, MD;  Location: [facility] OR;  Service: Neurosurgery;  Laterality: Left;   TOE SURGERY  1997   TONSILLECTOMY     TOTAL HIP ARTHROPLASTY Left 2007   TOTAL HIP ARTHROPLASTY Right 2010   TOTAL KNEE ARTHROPLASTY Right 06/25/2014   Procedure: RIGHT TOTAL KNEE  ARTHROPLASTY;  Surgeon: Loanne Drilling, MD;  Location: [facility] ORS;  Service: Orthopedics;  Laterality: Right;   VENTRICULOPERITONEAL SHUNT Right 07/09/2022   Procedure: SHUNT INSERTION VENTRICULAR-PERITONEAL;  Surgeon: Tressie Stalker, MD;  Location: [facility] OR;  Service: Neurosurgery;  Laterality: Right;    Current Outpatient Medications  Medication Instructions   ascorbic acid (VITAMIN C) 500 mg, Oral, Daily   aspirin 81 mg, Oral, Daily   atorvastatin (LIPITOR) 40 MG tablet TAKE 1 AND 1/2 TABLETS(60 MG) BY MOUTH AT BEDTIME   benazepril (LOTENSIN) 30 mg, Oral, Daily   diltiazem (DILT-XR) 180 mg, Oral, Daily   docusate sodium (COLACE) 100 mg, Oral, 2 times daily   ELIQUIS 5 MG TABS tablet TAKE 1 TABLET BY MOUTH TWICE DAILY   furosemide (LASIX) 40 MG tablet Take 1 1/2 tablet by mouth once daily   levothyroxine (SYNTHROID) 175 mcg, Oral, Daily before breakfast   LUMIGAN 0.01 % SOLN 1 drop, Both Eyes, Daily at bedtime   metoprolol succinate (TOPROL-XL) 25 MG 24 hr tablet 1/2 tablet a day as needed for pulse > 80   nitroGLYCERIN (NITROSTAT) 0.4 MG SL tablet PLACE 1 TABLET UNDER THE TONGUE EVERY 5 MINUTES AS NEEDED FOR CHEST PAIN   Timolol Maleate 0.5 % (DAILY) SOLN 1 drop, Ophthalmic, 2 times  daily, At breakfast and evening meals       Objective:   Physical Exam BP 138/82   Pulse 75   Temp 97.7 F (36.5 C) (Oral)   Resp 18   Ht 5\' 9"  (1.753 m)   Wt 199 lb 8 oz (90.5 kg)   SpO2 97%   BMI 29.46 kg/m  General:   Well developed, NAD, BMI noted.  HEENT:  Normocephalic . Face symmetric, atraumatic Neck: No thyromegaly, carotid pulses present. Lungs:  CTA B Normal respiratory effort, no intercostal retractions, no accessory muscle use. Heart: Irregularly irregular Abdomen:  Not distended, soft, non-tender. No rebound or rigidity.   Skin: Not pale. Not jaundice Lower extremities: Both legs are wrapped by the wound care center. Neurologic:  alert & oriented X3.  Speech normal, gait  appropriate for age, assisted by cane  psych--  Cognition and judgment appear intact.  Cooperative with normal attention span and concentration.  Behavior appropriate. No anxious or depressed appearing.     Assessment   Assessment  (Transfer from  Dr. Alwyn Ren [phone]) Hyperglycemia, A1c 6.2 2013 HTN Hyperlipidemia Hypothyroidism  Thyroid US 2014-- no nodules, inhomogeneous  CV: --CAD: stents 2006;  low risk nuclear scan 2011, 03-2016 --A Fib dx 09/2018 at regular crads OV, on Eliquis, EF --Carotid artery disease [place] 12/2014:   Stable, 1-39% RICA stenosis. Stable, 40-59% LICA stenosis. [place] 12/2015  [place] 04/2020: 0-39% B R leg edema, [place] (-)   DVT 08-2014 and 01/2019 [facility] DJD: Dr Despina Hick, back surgery Dr Lovell Sheehan 06-2016 Venous insufficiency, chronic R>L edema Tremor Glaucoma H/o skin cancer , denies Melanoma; sees derm, had a visit ~ 09/2017, Dr Margo Aye  PLAN: Here for CPX Hyperglycemia: Check A1c HTN: Seems well-controlled on current medications, ambulatory BPs sometimes low in the 106/70 range but he is asymptomatic, no low BP symptoms.  Check labs Hyperlipidemia: Last FLP satisfactory Hypothyroidism: On Synthroid, check TSH    CAD, A-fib, carotid disease Saw cardiology 12/15/2022, they noted patient was seen at the wound care center for lower extremity edema.  BP at that time was very good at 124/66 with a heart rate of 78 Check a carotid ultrasound. Leg swelling: Working with the wound care center to control swelling with wrappings. History of skin cancer: Sees dermatology regularly   - Td 2014 - pnm 2014, Prevnar 2016; PNM 20:  2023 - zostavax 2015 - shingrex recommended. -CoVID vaccines: Rec booster - Vaccines I recommend Tdap, Shingrix, COVID booster if not done after 05-2022, RSV, flu shot every fall -Prostate/colon ca screening: No further screenings -Labs:  BMP CBC A1c TSH  -Advance  Directives discussed    1-21 Hyperglycemia: Check A1c. HTN: Ambulatory BPs  typically 134/78, 111/57. Due to excessive urination the patient has been receiving Lasix only in the mornings, 1.5 tablets.  Edema is at baseline. Also he gets benazepril, diltiazem and as needed metoprolol. Plan: BMP, continue benazepril, diltiazem, okay to stay on a single dose of Lasix 1.5 tablet in the morning.  Will change metoprolol to prn because his heart rate at home is typically 50s, 60s.  Will use it only if heart rate is more than 80.  This was discussed with the patient's wife and she verbalized understanding. Atrial fibrillation: See above, anticoagulated. Hypothyroidism: Check TSH Normal pressure hydrocephalus: Status post ventriculoperitoneal shunt insertion and physical therapy.  Clinically improved. RTC CPX 3 months

## 2023-01-15 NOTE — Patient Instructions (Addendum)
Vaccines I recommend that you can get at your pharmacy Tdap (tetanus shot) Shingles shot COVID booster Flu shot every fall    Please bring [place] a copy of your Healthcare Power of Attorney for your chart.   Check the  blood pressure regularly BP GOAL is between 110/65 and  135/85. If it is consistently higher or lower, let me know      GO TO THE LAB : Get the blood work     GO TO THE FRONT DESK, PLEASE SCHEDULE YOUR APPOINTMENTS Come back for   a checkup in 4 months    "Health Care Power of attorney" ,  "Living will" (Advance care planning documents)  If you already have a living will or healthcare power of attorney, is recommended you bring the copy to be scanned in your chart.   The document will be available to all the doctors you see in the system.  Advance care planning is a process that supports adults in  understanding and sharing their preferences regarding future medical care.  The patient's preferences are recorded in documents called Advance Directives and the can be modified at any time while the patient is in full mental capacity.   If you don't have one, please consider create one.      More information at: [email or web address]

## 2023-01-16 ENCOUNTER — Encounter: Payer: Self-pay | Admitting: Internal Medicine

## 2023-01-16 NOTE — Assessment & Plan Note (Signed)
-   Td 2014 - pnm 2014, Prevnar 2016; PNM 20:  2023 - zostavax 2015 - shingrex recommended. - Vaccines I recommend Tdap, Shingrix, COVID booster if not done after 05-2022, RSV, flu shot every fall -Prostate/colon ca screening: No further screenings -Labs:  BMP CBC A1c TSH -Advance  Directives discussed

## 2023-01-16 NOTE — Assessment & Plan Note (Signed)
Here for CPX Hyperglycemia: Check A1c HTN: Seems well-controlled on current medications, ambulatory BPs sometimes low in the 106/70 range but  no low BP symptoms.  Check labs Hyperlipidemia: Last FLP satisfactory Hypothyroidism: On Synthroid, check TSH CAD, A-fib, carotid disease Saw cardiology 12/15/2022, they noted patient was seen at the wound care center for lower extremity edema.  BP at that time was very good at 124/66 with a heart rate of 78 Check a carotid ultrasound. Leg swelling: Working with the wound care center to control swelling with wrappings. History of skin cancer: Sees dermatology regularly RTC 4 m

## 2023-01-18 ENCOUNTER — Encounter: Payer: Self-pay | Admitting: Podiatry

## 2023-01-18 ENCOUNTER — Ambulatory Visit: Payer: Medicare Other | Admitting: Podiatry

## 2023-01-18 VITALS — BP 121/64

## 2023-01-18 DIAGNOSIS — M79674 Pain in right toe(s): Secondary | ICD-10-CM

## 2023-01-18 DIAGNOSIS — M79675 Pain in left toe(s): Secondary | ICD-10-CM

## 2023-01-18 DIAGNOSIS — I739 Peripheral vascular disease, unspecified: Secondary | ICD-10-CM

## 2023-01-18 DIAGNOSIS — L84 Corns and callosities: Secondary | ICD-10-CM

## 2023-01-18 DIAGNOSIS — B351 Tinea unguium: Secondary | ICD-10-CM | POA: Diagnosis not present

## 2023-01-18 MED ORDER — LEVOTHYROXINE SODIUM 100 MCG PO TABS: 200.0000 ug | ORAL_TABLET | Freq: Every day | ORAL | 0 refills | Status: DC

## 2023-01-18 NOTE — Addendum Note (Signed)
Addended by: Conrad D[place] D on: 01/18/2023 08:00 AM   Modules accepted: Orders

## 2023-01-19 ENCOUNTER — Encounter (HOSPITAL_BASED_OUTPATIENT_CLINIC_OR_DEPARTMENT_OTHER): Payer: Medicare Other | Admitting: General Surgery

## 2023-01-19 DIAGNOSIS — Z7901 Long term (current) use of anticoagulants: Secondary | ICD-10-CM | POA: Diagnosis not present

## 2023-01-19 DIAGNOSIS — Z955 Presence of coronary angioplasty implant and graft: Secondary | ICD-10-CM | POA: Diagnosis not present

## 2023-01-19 DIAGNOSIS — E785 Hyperlipidemia, unspecified: Secondary | ICD-10-CM | POA: Diagnosis not present

## 2023-01-19 DIAGNOSIS — L988 Other specified disorders of the skin and subcutaneous tissue: Secondary | ICD-10-CM | POA: Diagnosis not present

## 2023-01-19 DIAGNOSIS — I480 Paroxysmal atrial fibrillation: Secondary | ICD-10-CM | POA: Diagnosis not present

## 2023-01-19 DIAGNOSIS — I872 Venous insufficiency (chronic) (peripheral): Secondary | ICD-10-CM | POA: Diagnosis not present

## 2023-01-19 DIAGNOSIS — L97812 Non-pressure chronic ulcer of other part of right lower leg with fat layer exposed: Secondary | ICD-10-CM | POA: Diagnosis not present

## 2023-01-19 DIAGNOSIS — L97222 Non-pressure chronic ulcer of left calf with fat layer exposed: Secondary | ICD-10-CM | POA: Diagnosis not present

## 2023-01-19 DIAGNOSIS — I2510 Atherosclerotic heart disease of native coronary artery without angina pectoris: Secondary | ICD-10-CM | POA: Diagnosis not present

## 2023-01-19 DIAGNOSIS — Z982 Presence of cerebrospinal fluid drainage device: Secondary | ICD-10-CM | POA: Diagnosis not present

## 2023-01-19 DIAGNOSIS — Z79899 Other long term (current) drug therapy: Secondary | ICD-10-CM | POA: Diagnosis not present

## 2023-01-19 DIAGNOSIS — I10 Essential (primary) hypertension: Secondary | ICD-10-CM | POA: Diagnosis not present

## 2023-01-19 DIAGNOSIS — I890 Lymphedema, not elsewhere classified: Secondary | ICD-10-CM | POA: Diagnosis not present

## 2023-01-19 NOTE — Progress Notes (Signed)
  Subjective:  Patient ID: [name], male    DOB: 09/22/1935,  MRN: [id number]  [name] presents to clinic today for at risk foot care. Patient has h/o PAD  Chief Complaint  Patient presents with   Nail Problem    [facility] PCP-[facility] PCP VST-"Last week"   New problem(s): None.   PCP is Wanda Plump, MD.  Allergies  Allergen Reactions   Norvasc [Amlodipine Besylate] Swelling    Review of Systems: Negative except as noted in the HPI.  Objective: No changes noted in today's physical examination. Vitals:   01/18/23 1503  BP: 121/64   [name] is a pleasant 87 y.o. male WD, WN in NAD. AAO x 3.  Vascular Examination: Vascular status with faintly palpable pedal pulses b/l. Diminished PT pulses b/l. Pedal hair sparse b/l. CFT <3 seconds b/l. Trace edema b/l LE.  No pain with calf compression b/l. Skin temperature gradient WNL b/l. Evidence of chronic venous insufficiency b/l LE.  Neurological Examination: Sensation grossly intact b/l with 10 gram monofilament. Vibratory sensation intact b/l.   Dermatological Examination: Pedal skin with normal turgor, texture and tone b/l. Toenails 1-5 b/l thick, discolored, elongated with subungual debris and pain on dorsal palpation.   Hyperkeratotic lesion(s) distal tip of right 3rd toe, submet head 1 right foot.  No erythema, no edema, no drainage, no fluctuance.  Musculoskeletal Examination: Muscle strength 5/5 to b/l LE. No pain, crepitus or joint limitation noted with ROM bilateral LE. HAV with bunion deformity noted b/l LE. Hammertoe(s) noted to the left second digit.  Radiographs: None Assessment/Plan: 1. Pain due to onychomycosis of toenails of both feet   2. Corns and callosities   3. PAD (peripheral artery disease) (HCC)     -Consent given for treatment as described below: -Examined patient. -Mycotic toenails 1-5 bilaterally were debrided in length and girth with sterile nail nippers and dremel without  incident. -Corn(s) distal tip of right 3rd toe pared utilizing sharp debridement with sterile blade without complication or incident. Total number debrided=1. -Callus(es) submet head 1 right foot pared utilizing sharp debridement with sterile blade without complication or incident. Total number debrided =1. -Patient/POA to call should there be question/concern in the interim.   Return in about 3 months (around 04/19/2023).  Freddie Breech, DPM

## 2023-01-20 NOTE — Progress Notes (Signed)
ZAKARI, BATHE ([id number]) [id number] Page 1 of 9 Visit Report for 01/19/2023 Arrival Information Details Patient Name: Date of Service: [name], [name] 01/19/2023 12:30 PM Medical Record Number: [id number] Patient Account Number: [id number] Date of Birth/Sex: Treating RN: 1936/02/28 (87 y.o. [name] Primary Care Grae Cannata: Willow Ora Other Clinician: Referring Shanae Luo: Treating Farida Mcreynolds/Extender: Cresenciano Genre in Treatment: 23 Visit Information History Since Last Visit Added or deleted any medications: No Patient Arrived: Gilmer Mor Any new allergies or adverse reactions: No Arrival Time: 12:36 Had a fall or experienced change in No Accompanied By: wife activities of daily living that may affect Transfer Assistance: None risk of falls: Patient Identification Verified: Yes Signs or symptoms of abuse/neglect since last visito No Secondary Verification Process Completed: Yes Hospitalized since last visit: No Patient Requires Transmission-Based Precautions: No Implantable device outside of the clinic excluding No Patient Has Alerts: No cellular tissue based products placed in the center since last visit: Has Dressing in Place as Prescribed: Yes Has Compression in Place as Prescribed: Yes Pain Present Now: No Electronic Signature(s) Signed: 01/19/2023 3:22:44 PM By: Samuella Bruin Entered By: Samuella Bruin on 01/19/2023 12:37:03 -------------------------------------------------------------------------------- Compression Therapy Details Patient Name: Date of Service: [name], [name] 01/19/2023 12:30 PM Medical Record Number: [id number] Patient Account Number: [id number] Date of Birth/Sex: Treating RN: 1936-01-13 (87 y.o. [name] Primary Care Cotton Beckley: Willow Ora Other Clinician: Referring Gerome Kokesh: Treating Archer Moist/Extender: Cresenciano Genre in Treatment: 23 Compression Therapy  Performed for Wound Assessment: Wound #3 Left,Posterior Lower Leg Performed By: Clinician Samuella Bruin, RN Compression Type: Double Layer Post Procedure Diagnosis Same as Pre-procedure Electronic Signature(s) Signed: 01/19/2023 3:22:44 PM By: Gelene Mink By: Samuella Bruin on 01/19/2023 12:54:47 -------------------------------------------------------------------------------- Compression Therapy Details Patient Name: Date of Service: [name], [name] 01/19/2023 12:30 PM Medical Record Number: [id number] Patient Account Number: [id number] Date of Birth/Sex: Treating RN: 1936/09/09 (87 y.o. [name] Primary Care Chelsea Pedretti: Willow Ora Other Clinician: Referring Lithzy Bernard: Treating Rossie Scarfone/Extender: Cresenciano Genre in Treatment: 23 Compression Therapy Performed for Wound Assessment: Wound #5 Right,Medial Lower Leg Performed By: Clinician Samuella Bruin, RN Compression Type: Double Layer Post Procedure Diagnosis Same as Pre-procedure CANDEN, CIESLINSKI E ([id number]) [id number] Page 2 of 9 Electronic Signature(s) Signed: 01/19/2023 3:22:44 PM By: Gelene Mink By: Samuella Bruin on 01/19/2023 12:54:48 -------------------------------------------------------------------------------- Encounter Discharge Information Details Patient Name: Date of Service: [name], [name] 01/19/2023 12:30 PM Medical Record Number: [id number] Patient Account Number: [id number] Date of Birth/Sex: Treating RN: 07/15/1936 (86 y.o. [name] Primary Care Jayshun Galentine: Willow Ora Other Clinician: Referring Kalynne Womac: Treating Xavier Fournier/Extender: Cresenciano Genre in Treatment: 23 Encounter Discharge Information Items Post Procedure Vitals Discharge Condition: Stable Temperature (F): 98.1 Ambulatory Status: Cane Pulse (bpm): 80 Discharge Destination: Home Respiratory Rate (breaths/min):  16 Transportation: Private Auto Blood Pressure (mmHg): 166/71 Accompanied By: wife Schedule Follow-up Appointment: Yes Clinical Summary of Care: Patient Declined Electronic Signature(s) Signed: 01/19/2023 3:22:44 PM By: Samuella Bruin Entered By: Samuella Bruin on 01/19/2023 13:00:43 -------------------------------------------------------------------------------- Lower Extremity Assessment Details Patient Name: Date of Service: [name], [name] 01/19/2023 12:30 PM Medical Record Number: [id number] Patient Account Number: [id number] Date of Birth/Sex: Treating RN: 06-12-1936 (87 y.o. [name] Primary Care Shields Pautz: Willow Ora Other Clinician: Referring Mohamed Portlock: Treating Aubriella Perezgarcia/Extender: Elsie Saas Weeks in Treatment: 23 Edema Assessment Assessed: [Left: No] [Right: No] [Left: Edema] [Right: :] Calf Left: Right: Point of Measurement: From  Medial Instep 35 cm 36.3 cm Ankle Left: Right: Point of Measurement: From Medial Instep 21.5 cm 23.3 cm Vascular Assessment Pulses: Dorsalis Pedis Palpable: [Left:Yes] [Right:Yes] Electronic Signature(s) Signed: 01/19/2023 3:22:44 PM By: Samuella Bruin Entered By: Samuella Bruin on 01/19/2023 12:46:47 Multi Wound Chart Details -------------------------------------------------------------------------------- Shella Spearing ([id number]) [id number] Page 3 of 9 Patient Name: Date of Service: [name], [name] 01/19/2023 12:30 PM Medical Record Number: [id number] Patient Account Number: [id number] Date of Birth/Sex: Treating RN: 12/25/1935 (87 y.o. M) Primary Care Tage Feggins: Willow Ora Other Clinician: Referring Pippa Hanif: Treating Andron Marrazzo/Extender: Cresenciano Genre in Treatment: 23 Vital Signs Height(in): 69 Pulse(bpm): 80 Weight(lbs): 198 Blood Pressure(mmHg): 166/71 Body Mass Index(BMI): 29.2 Temperature(F): 98.1 Respiratory Rate(breaths/min):  16 Wound Assessments Wound Number: 3 5 N/A Photos: N/A Left, Posterior Lower Leg Right, Medial Lower Leg N/A Wound Location: Puncture Gradually Appeared N/A Wounding Event: Lesion Lymphedema N/A Primary Etiology: Glaucoma, Arrhythmia, Coronary Glaucoma, Arrhythmia, Coronary N/A Comorbid History: Artery Disease, Hypertension, Artery Disease, Hypertension, Peripheral Venous Disease, Peripheral Venous Disease, Osteoarthritis Osteoarthritis 08/12/2022 12/29/2022 N/A Date Acquired: 21 3 N/A Weeks of Treatment: Open Open N/A Wound Status: No No N/A Wound Recurrence: 0.5x0.5x0.1 2.5x1x0.1 N/A Measurements L x W x D (cm) 0.196 1.963 N/A A (cm) : rea 0.02 0.196 N/A Volume (cm) : -176.10% 97.50% N/A % Reduction in A rea: -42.90% 97.60% N/A % Reduction in Volume: Full Thickness Without Exposed Full Thickness Without Exposed N/A Classification: Support Structures Support Structures Medium Medium N/A Exudate A mount: Serosanguineous Serous N/A Exudate Type: red, brown amber N/A Exudate Color: Distinct, outline attached Distinct, outline attached N/A Wound Margin: Large (67-100%) Medium (34-66%) N/A Granulation A mount: Red Red, Pink N/A Granulation Quality: Small (1-33%) Medium (34-66%) N/A Necrotic A mount: Adherent Slough Eschar, Adherent Slough N/A Necrotic Tissue: Fat Layer (Subcutaneous Tissue): Yes Fat Layer (Subcutaneous Tissue): Yes N/A Exposed Structures: Fascia: No Fascia: No Tendon: No Tendon: No Muscle: No Muscle: No Joint: No Joint: No Bone: No Bone: No Medium (34-66%) Medium (34-66%) N/A Epithelialization: Debridement - Excisional Debridement - Selective/Open Wound N/A Debridement: Pre-procedure Verification/Time Out 12:53 12:53 N/A Taken: Lidocaine 4% Topical Solution Lidocaine 4% Topical Solution N/A Pain Control: Subcutaneous, Slough Necrotic/Eschar, [employer] N/A Tissue Debrided: Skin/Subcutaneous Tissue Non-Viable Tissue N/A Level: 0.2  12.56 N/A Debridement A (sq cm): rea Curette Curette N/A Instrument: Minimum Minimum N/A Bleeding: Pressure Pressure N/A Hemostasis A chieved: Procedure was tolerated well Procedure was tolerated well N/A Debridement Treatment Response: 0.5x0.5x0.1 4x4x0.1 N/A Post Debridement Measurements L x W x D (cm) 0.02 1.257 N/A Post Debridement Volume: (cm) No Abnormalities Noted No Abnormalities Noted N/A Periwound Skin Texture: No Abnormalities Noted Dry/Scaly: Yes N/A Periwound Skin Moisture: Hemosiderin Staining: Yes Hemosiderin Staining: Yes N/A Periwound Skin Color: No Abnormality No Abnormality N/A Temperature: Compression Therapy Compression Therapy N/A Procedures Performed: Debridement Debridement Treatment Notes RYVER, POBLETE ([id number]) [id number] Page 4 of 9 Wound #3 (Lower Leg) Wound Laterality: Left, Posterior Cleanser Soap and Water Discharge Instruction: May shower and wash wound with dial antibacterial soap and water prior to dressing change. Wound Cleanser Discharge Instruction: Cleanse the wound with wound cleanser prior to applying a clean dressing using gauze sponges, not tissue or cotton balls. Peri-Wound Care Sween Lotion (Moisturizing lotion) Discharge Instruction: Apply moisturizing lotion as directed Topical Primary Dressing Promogran Prisma Matrix, 4.34 (sq in) (silver collagen) Discharge Instruction: Moisten collagen with saline or hydrogel Secondary Dressing ABD Pad, 5x9 Discharge Instruction: Apply over primary dressing as directed. Woven Gauze Sponge, Non-Sterile  4x4 in Discharge Instruction: Apply over primary dressing as directed. Secured With Compression Wrap Urgo K2 Lite, two layer compression system, regular Discharge Instruction: Apply Urgo K2 Lite as directed (alternative to 3 layer compression). Compression Stockings Add-Ons Wound #5 (Lower Leg) Wound Laterality: Right, Medial Cleanser Soap and  Water Discharge Instruction: May shower and wash wound with dial antibacterial soap and water prior to dressing change. Wound Cleanser Discharge Instruction: Cleanse the wound with wound cleanser prior to applying a clean dressing using gauze sponges, not tissue or cotton balls. Peri-Wound Care Sween Lotion (Moisturizing lotion) Discharge Instruction: Apply moisturizing lotion as directed Topical Gentamicin Discharge Instruction: As directed by physician Primary Dressing Maxorb Extra Ag+ Alginate Dressing, 4x4.75 (in/in) Discharge Instruction: Apply to wound bed as instructed Secondary Dressing ABD Pad, 5x9 Discharge Instruction: Apply over primary dressing as directed. Woven Gauze Sponge, Non-Sterile 4x4 in Discharge Instruction: Apply over primary dressing as directed. Secured With Compression Wrap Urgo K2 Lite, two layer compression system, regular Discharge Instruction: Apply Urgo K2 Lite as directed (alternative to 3 layer compression). Compression Stockings Add-Ons Electronic Signature(s) Signed: 01/19/2023 1:24:39 PM By: Duanne Guess MD FACS CORTLAND, CREHAN ([id number]) PM By: Duanne Guess MD FACS [phone].pdf Page 5 of 9 Signed: 01/19/2023 1:24:39 Entered By: Duanne Guess on 01/19/2023 13:24:39 -------------------------------------------------------------------------------- Multi-Disciplinary Care Plan Details Patient Name: Date of Service: [name], [name] 01/19/2023 12:30 PM Medical Record Number: [id number] Patient Account Number: [id number] Date of Birth/Sex: Treating RN: 03-Jul-1936 (87 y.o. [name] Primary Care Jontue Crumpacker: Willow Ora Other Clinician: Referring Linton Stolp: Treating Katiria Calame/Extender: Cresenciano Genre in Treatment: 23 Active Inactive Necrotic Tissue Nursing Diagnoses: Impaired tissue integrity related to necrotic/devitalized tissue Knowledge deficit related to management of  necrotic/devitalized tissue Goals: Necrotic/devitalized tissue will be minimized in the wound bed Date Initiated: 08/25/2022 Target Resolution Date: 06/19/2023 Goal Status: Active Patient/caregiver will verbalize understanding of reason and process for debridement of necrotic tissue Date Initiated: 08/25/2022 Target Resolution Date: 06/19/2023 Goal Status: Active Interventions: Assess patient pain level pre-, during and post procedure and prior to discharge Provide education on necrotic tissue and debridement process Treatment Activities: Apply topical anesthetic as ordered : 08/25/2022 Notes: Electronic Signature(s) Signed: 01/19/2023 3:22:44 PM By: Samuella Bruin Entered By: Samuella Bruin on 01/19/2023 12:56:01 -------------------------------------------------------------------------------- Pain Assessment Details Patient Name: Date of Service: [name], [name] 01/19/2023 12:30 PM Medical Record Number: [id number] Patient Account Number: [id number] Date of Birth/Sex: Treating RN: 25-Aug-1936 (87 y.o. [name] Primary Care Kipper Buch: Willow Ora Other Clinician: Referring Joetta Delprado: Treating Daleah Coulson/Extender: Cresenciano Genre in Treatment: 23 Active Problems Location of Pain Severity and Description of Pain Patient Has Paino No Site Locations Rate the pain. REXTON, GREULICH ([id number]) [id number] Page 6 of 9 Rate the pain. Current Pain Level: 0 Pain Management and Medication Current Pain Management: Electronic Signature(s) Signed: 01/19/2023 3:22:44 PM By: Samuella Bruin Entered By: Samuella Bruin on 01/19/2023 12:37:22 -------------------------------------------------------------------------------- Patient/Caregiver Education Details Patient Name: Date of Service: [name], [name] 4/30/2024andnbsp12:30 PM Medical Record Number: [id number] Patient Account Number: [id number] Date of Birth/Gender: Treating  RN: 1936/08/22 (87 y.o. [name] Primary Care Physician: Willow Ora Other Clinician: Referring Physician: Treating Physician/Extender: Cresenciano Genre in Treatment: 23 Education Assessment Education Provided To: Patient Education Topics Provided Wound/Skin Impairment: Methods: Explain/Verbal Responses: Reinforcements needed, State content correctly Electronic Signature(s) Signed: 01/19/2023 3:22:44 PM By: Samuella Bruin Entered By: Samuella Bruin on 01/19/2023 12:56:11 -------------------------------------------------------------------------------- Wound Assessment Details Patient Name: Date  of Service: [name], [name] 01/19/2023 12:30 PM Medical Record Number: [id number] Patient Account Number: [id number] Date of Birth/Sex: Treating RN: [birth year] (87 y.o. [name] Primary Care Yahshua Thibault: Willow Ora Other Clinician: Referring [name] Kiner: Treating Bradlee Heitman/Extender: Elsie Saas Weeks in Treatment: 23 Wound Status Wound Number: 3 Primary Lesion Etiology: Wound Location: Left, Posterior Lower Leg Wound Open Wounding Event: Puncture Status: Date Acquired: 08/12/2022 Comorbid Glaucoma, Arrhythmia, Coronary Artery Disease, Hypertension, Weeks Of Treatment: 21 History: Peripheral Venous Disease, Osteoarthritis Clustered Wound: No HAMLIN, DEVINE ([id number]) [id number] Page 7 of 9 Photos Wound Measurements Length: (cm) 0.5 Width: (cm) 0.5 Depth: (cm) 0.1 Area: (cm) 0.196 Volume: (cm) 0.02 % Reduction in Area: -176.1% % Reduction in Volume: -42.9% Epithelialization: Medium (34-66%) Tunneling: No Undermining: No Wound Description Classification: Full Thickness Without Exposed Support Structures Wound Margin: Distinct, outline attached Exudate Amount: Medium Exudate Type: Serosanguineous Exudate Color: red, brown Foul Odor After Cleansing: No Slough/Fibrino Yes Wound  Bed Granulation Amount: Large (67-100%) Exposed Structure Granulation Quality: Red Fascia Exposed: No Necrotic Amount: Small (1-33%) Fat Layer (Subcutaneous Tissue) Exposed: Yes Necrotic Quality: Adherent Slough Tendon Exposed: No Muscle Exposed: No Joint Exposed: No Bone Exposed: No Periwound Skin Texture Texture Color No Abnormalities Noted: Yes No Abnormalities Noted: No Hemosiderin Staining: Yes Moisture No Abnormalities Noted: Yes Temperature / Pain Temperature: No Abnormality Treatment Notes Wound #3 (Lower Leg) Wound Laterality: Left, Posterior Cleanser Soap and Water Discharge Instruction: May shower and wash wound with dial antibacterial soap and water prior to dressing change. Wound Cleanser Discharge Instruction: Cleanse the wound with wound cleanser prior to applying a clean dressing using gauze sponges, not tissue or cotton balls. Peri-Wound Care Sween Lotion (Moisturizing lotion) Discharge Instruction: Apply moisturizing lotion as directed Topical Primary Dressing Promogran Prisma Matrix, 4.34 (sq in) (silver collagen) Discharge Instruction: Moisten collagen with saline or hydrogel Secondary Dressing ABD Pad, 5x9 Discharge Instruction: Apply over primary dressing as directed. Woven Gauze Sponge, Non-Sterile 4x4 in Discharge Instruction: Apply over primary dressing as directed. Secured With ASAH, LAMAY ([id number]) [id number] Page 8 of 9 Compression Wrap Urgo K2 Lite, two layer compression system, regular Discharge Instruction: Apply Urgo K2 Lite as directed (alternative to 3 layer compression). Compression Stockings Add-Ons Electronic Signature(s) Signed: 01/19/2023 3:22:44 PM By: Samuella Bruin Entered By: Samuella Bruin on 01/19/2023 12:50:28 -------------------------------------------------------------------------------- Wound Assessment Details Patient Name: Date of Service: [name], [name] 01/19/2023 12:30  PM Medical Record Number: [id number] Patient Account Number: [id number] Date of Birth/Sex: Treating RN: August 28, 1936 (87 y.o. [name] Primary Care Jermale Crass: Willow Ora Other Clinician: Referring Pamula Luther: Treating Onie Hayashi/Extender: Elsie Saas Weeks in Treatment: 23 Wound Status Wound Number: 5 Primary Lymphedema Etiology: Wound Location: Right, Medial Lower Leg Wound Open Wounding Event: Gradually Appeared Status: Date Acquired: 12/29/2022 Comorbid Glaucoma, Arrhythmia, Coronary Artery Disease, Hypertension, Weeks Of Treatment: 3 History: Peripheral Venous Disease, Osteoarthritis Clustered Wound: No Photos Wound Measurements Length: (cm) 2.5 Width: (cm) 1 Depth: (cm) 0.1 Area: (cm) 1.963 Volume: (cm) 0.196 % Reduction in Area: 97.5% % Reduction in Volume: 97.6% Epithelialization: Medium (34-66%) Tunneling: No Undermining: No Wound Description Classification: Full Thickness Without Exposed Suppor Wound Margin: Distinct, outline attached Exudate Amount: Medium Exudate Type: Serous Exudate Color: amber t Structures Foul Odor After Cleansing: No Slough/Fibrino Yes Wound Bed Granulation Amount: Medium (34-66%) Exposed Structure Granulation Quality: Red, Pink Fascia Exposed: No Necrotic Amount: Medium (34-66%) Fat Layer (Subcutaneous Tissue) Exposed: Yes Necrotic Quality: Eschar, Adherent Slough Tendon Exposed: No Muscle Exposed:  No Joint Exposed: No Bone Exposed: No Periwound Skin Texture Texture Color No Abnormalities Noted: Yes No Abnormalities Noted: No Hemosiderin Staining: [address] KEALII, THUESON ([id number]) [id number] Page 9 of 9 No Abnormalities Noted: No Temperature / Pain Dry / Scaly: Yes Temperature: No Abnormality Treatment Notes Wound #5 (Lower Leg) Wound Laterality: Right, Medial Cleanser Soap and Water Discharge Instruction: May shower and wash wound with dial antibacterial soap and  water prior to dressing change. Wound Cleanser Discharge Instruction: Cleanse the wound with wound cleanser prior to applying a clean dressing using gauze sponges, not tissue or cotton balls. Peri-Wound Care Sween Lotion (Moisturizing lotion) Discharge Instruction: Apply moisturizing lotion as directed Topical Gentamicin Discharge Instruction: As directed by physician Primary Dressing Maxorb Extra Ag+ Alginate Dressing, 4x4.75 (in/in) Discharge Instruction: Apply to wound bed as instructed Secondary Dressing ABD Pad, 5x9 Discharge Instruction: Apply over primary dressing as directed. Woven Gauze Sponge, Non-Sterile 4x4 in Discharge Instruction: Apply over primary dressing as directed. Secured With Compression Wrap Urgo K2 Lite, two layer compression system, regular Discharge Instruction: Apply Urgo K2 Lite as directed (alternative to 3 layer compression). Compression Stockings Add-Ons Electronic Signature(s) Signed: 01/19/2023 3:22:44 PM By: Samuella Bruin Entered By: Samuella Bruin on 01/19/2023 12:50:59 -------------------------------------------------------------------------------- Vitals Details Patient Name: Date of Service: [name], [name] 01/19/2023 12:30 PM Medical Record Number: [id number] Patient Account Number: [id number] Date of Birth/Sex: Treating RN: 27-Mar-1936 (87 y.o. [name] Primary Care Abed Schar: Willow Ora Other Clinician: Referring Tamika Nou: Treating Serge Main/Extender: Cresenciano Genre in Treatment: 23 Vital Signs Time Taken: 12:37 Temperature (F): 98.1 Height (in): 69 Pulse (bpm): 80 Weight (lbs): 198 Respiratory Rate (breaths/min): 16 Body Mass Index (BMI): 29.2 Blood Pressure (mmHg): 166/71 Reference Range: 80 - 120 mg / dl Electronic Signature(s) Signed: 01/19/2023 3:22:44 PM By: Samuella Bruin Entered By: Samuella Bruin on 01/19/2023 12:37:16

## 2023-01-20 NOTE — Progress Notes (Signed)
[name] ([id number]) [id number] Page 1 of 12 Visit Report for 01/19/2023 Chief Complaint Document Details Patient Name: Date of Service: [name] 01/19/2023 12:30 PM Medical Record Number: [id number] Patient Account Number: [id number] Date of Birth/Sex: Treating RN: 07-27-1936 (87 y.o. M) Primary Care Provider: Willow Ora Other Clinician: Referring Provider: Treating Provider/Extender: Cresenciano Genre in Treatment: 23 Information Obtained from: Patient Chief Complaint Left lower extremity wound Electronic Signature(s) Signed: 01/19/2023 1:24:45 PM By: Duanne Guess MD FACS Entered By: Duanne Guess on 01/19/2023 13:24:45 -------------------------------------------------------------------------------- Debridement Details Patient Name: Date of Service: [name] 01/19/2023 12:30 PM Medical Record Number: [id number] Patient Account Number: [id number] Date of Birth/Sex: Treating RN: 03-Feb-1936 (87 y.o. [name] Primary Care Provider: Willow Ora Other Clinician: Referring Provider: Treating Provider/Extender: Cresenciano Genre in Treatment: 23 Debridement Performed for Assessment: Wound #5 Right,Medial Lower Leg Performed By: Physician Duanne Guess, MD Debridement Type: Debridement Level of Consciousness (Pre-procedure): Awake and Alert Pre-procedure Verification/Time Out Yes - 12:53 Taken: Start Time: 12:53 Pain Control: Lidocaine 4% Topical Solution Percent of Wound Bed Debrided: 100% T Area Debrided (cm): otal 12.56 Tissue and other material debrided: Non-Viable, Eschar, Slough, Slough Level: Non-Viable Tissue Debridement Description: Selective/Open Wound Instrument: Curette Bleeding: Minimum Hemostasis Achieved: Pressure Response to Treatment: Procedure was tolerated well Level of Consciousness (Post- Awake and Alert procedure): Post Debridement Measurements of Total  Wound Length: (cm) 4 Width: (cm) 4 Depth: (cm) 0.1 Volume: (cm) 1.257 Character of Wound/Ulcer Post Debridement: Improved Post Procedure Diagnosis Same as Pre-procedure Notes Scribed for Dr Lady Gary by Samuella Bruin, RN Electronic Signature(s) Signed: 01/19/2023 3:22:44 PM By: Samuella Bruin Signed: 01/19/2023 3:54:11 PM By: Duanne Guess MD FACS Entered By: Samuella Bruin on 01/19/2023 13:03:04 [name], [name] ([id number]) [id number] Page 2 of 12 -------------------------------------------------------------------------------- Debridement Details Patient Name: Date of Service: [name] 01/19/2023 12:30 PM Medical Record Number: [id number] Patient Account Number: [id number] Date of Birth/Sex: Treating RN: 1935/12/14 (87 y.o. [name] Primary Care Provider: Willow Ora Other Clinician: Referring Provider: Treating Provider/Extender: Cresenciano Genre in Treatment: 23 Debridement Performed for Assessment: Wound #3 Left,Posterior Lower Leg Performed By: Physician Duanne Guess, MD Debridement Type: Debridement Level of Consciousness (Pre-procedure): Awake and Alert Pre-procedure Verification/Time Out Yes - 12:53 Taken: Start Time: 12:53 Pain Control: Lidocaine 4% T opical Solution Percent of Wound Bed Debrided: 100% T Area Debrided (cm): otal 0.2 Tissue and other material debrided: Non-Viable, Slough, Subcutaneous, Slough Level: Skin/Subcutaneous Tissue Debridement Description: Excisional Instrument: Curette Bleeding: Minimum Hemostasis Achieved: Pressure Response to Treatment: Procedure was tolerated well Level of Consciousness (Post- Awake and Alert procedure): Post Debridement Measurements of Total Wound Length: (cm) 0.5 Width: (cm) 0.5 Depth: (cm) 0.1 Volume: (cm) 0.02 Character of Wound/Ulcer Post Debridement: Improved Post Procedure Diagnosis Same as Pre-procedure Notes Scribed for  Dr Lady Gary by Samuella Bruin, RN Electronic Signature(s) Signed: 01/19/2023 3:22:44 PM By: Samuella Bruin Signed: 01/19/2023 3:54:11 PM By: Duanne Guess MD FACS Entered By: Samuella Bruin on 01/19/2023 13:05:06 -------------------------------------------------------------------------------- HPI Details Patient Name: Date of Service: [name] 01/19/2023 12:30 PM Medical Record Number: [id number] Patient Account Number: [id number] Date of Birth/Sex: Treating RN: 08-18-1936 (87 y.o. M) Primary Care Provider: Willow Ora Other Clinician: Referring Provider: Treating Provider/Extender: Cresenciano Genre in Treatment: 23 History of Present Illness HPI Description: [name] is an 87 year old male with a past medical history of CAD s/p DES to LCx 2006, carotid  artery disease, hypertension, hyperlipidemia, paroxysmal atrial fibrillation on Eliquis and Cardizem that presents today for right lower extremity wound. He was seen by Dr. Drue Novel, his primary care provider on 4/29 for this issue and was referred to our clinic. Patient states that he had a small wound that spontaneously started in October 2021 and has not healed. He states this has progressively gotten larger. He has been using acne cream prescribed by the dermatologist for this issue. He reports drainage to the wound but no purulent drainage. He reports mild soreness to the wound. He has swelling in his right leg greater than left and reports this is a chronic issue for the past 1 to 2 years. He denies resting leg pain or pain with ambulation. Of note He was prescribed doxycycline at the end of last month and has finished this course for possible skin infection to the wound site. 02/06/2021; I am seeing this patient who was admitted to the clinic last week by Dr. Mikey Bussing. He has predominantly I think a venous insufficiency wound on the right medial lower leg he has been using Santyl Hydrofera Blue under 3 layer  compression. Arterial studies were ordered last week but do not seem to been put through. He is not a diabetic. He did have venous reflux studies done in August 2020. He did have abnormal reflux time was noted in the great saphenous vein in the distal thigh and the great saphenous vein at the knee. There was no evidence of DVT or SVT at the time he was not felt to have large enough KHALEN, STYER ([id number]) [id number] Page 3 of 12 saphenous veins on either side for intervention. Compression stockings at least knee-high were recommended. Follow-up was on a as needed basis with Dr. Randie Heinz The patient does not describe claudication but his pulses in his feet are not vibrant this could be because of the swelling 5/26; patient presents for 1 week follow-up. He has been using Hydrofera Blue under 3 layer compression. He had arterial studies done. He has no issues or complaints today. He denies signs of infection. He has his juxta light compressions with him today. 6/3; patient presents for 1 week follow-up. He has been using Hydrofera Blue under 3 layer compression. He denies signs and symptoms of infection. He did have bright green drainage which he states he has not seen before when the dressing was taken off today. He is using his juxta light compression to the left leg. 6/10; patient presents for 1 week follow-up. He has been tolerating Hydrofera Blue with 3 layer compression. He again reports bright green drainage. However he denies any signs of infection. He has been using juxta light compression to the left leg. 6/24; patient presents for 2-week follow-up. He has been tolerating Hydrofera Blue with 3 layer compression. Today he is healed. He brought his juxta lite compression for the right leg and continues to wear the juxta light compression on the left leg READMISSION 05/05/2022 The patient returns to clinic today with a new wound on his left posterior leg. He has  been wearing his juxta lite stockings on a regular basis, but on exam he still has 3+ pitting edema. I suspect he has lymphedema in addition to his known venous reflux. The wounds are scattered geographic wounds with light slough accumulation. They have been applying mupirocin and CeraVe ointment. He does have a VP shunt placement scheduled for next week, but it sounds like his neurosurgeon is not aware of the fact that  he has an open wound. 05/11/2022: His VP shunt placement has been postponed while we get his wound to heal. The wounds are all smaller today with just a little eschar and minimal slough. Edema control is good. No concern for infection. 05/18/2022: All of the open areas have contracted and there is good perimeter epithelialization. There is a little bit of eschar and slough on the open areas. Good edema control. No concern for infection. 05/26/2022: Many of the small open areas have closed. There is Hydrofera Blue sponge stuck in a number of places and this is unable to be removed by the intake nurse. There is a fair amount of eschar and a little bit of slough present. 06/02/2022: Continued closure of small open skin sites. There are just a couple remaining and these have a little eschar on the surface. Edema control is excellent. 06/09/2022: We are down to just 1 small open area. There is some eschar and slough present. No concern for infection. Edema control is excellent. 06/16/2022: His wound is healed. READMISSION 08/11/2022 Mr. Short underwent a successful VP shunt placement. He returns to clinic today because of a suspicious lesion on his left posterior calf. He actually has no open wounds and his edema control is excellent. He is wearing his juxta lite stockings religiously. On his left posterior calf, there is a raised scaly lesion concerning for potential squamous cell carcinoma. 08/25/2022: The biopsy that I took at his last visit was consistent with a well-differentiated squamous  cell carcinoma. He has an appointment at the skin surgery center on December 14. The wound from the biopsy has some slough accumulation, but no concern for infection. 11/06/2022: Since his last visit here, he has undergone Mohs surgery for the squamous cell carcinoma that I biopsied. He has a fairly substantial and deep defect. Apparently the procedure took place between 10 to 12 days ago and he has been in an [employer] ever since. When his Unna boot was removed, he was found to have crusting on his anterior tibial surface with small superficial wounds underneath. 11/16/2022: The anterior tibial surface wounds seem to largely be closed with just a couple of tiny open areas under some eschar. The Mohs defect is quite a bit cleaner, but still with nonviable subcutaneous tissue present along with slough and eschar, much of which is dried up Iodoflex. 11/24/2022: The anterior tibial wounds are closed. The Mohs defect is cleaner again this week with much less nonviable tissue. 12/11/2022: The Mohs defect continues to contract. There is good granulation tissue underneath a layer of dried up Iodoflex and some slough. 12/21/2022: The wound is smaller again this week, as well as shallower. There is a little bit of slough on the surface. 12/29/2022: His right leg has become massively swollen and he has had skin breakdown occur as a result with a lot of serous drainage. He has not been particularly compliant with compression garment wear, nor has he been elevating his legs. The wound on his left leg is smaller and edema control on the side is very good. 01/07/2023: He has built up some slough and eschar on the right leg, but edema control has improved markedly. The wound on his left leg is much smaller again today with just a little slough on the surface. 01/12/2023: Both legs have improved. There is less open area on the right leg, but there is still slough and eschar accumulation. The left leg wound is smaller again  today, but still has slough buildup.  01/19/2023: Both legs continue to improve. The drainage from the right leg was malodorous today and is also blue-green in color. The left leg wound is considerably smaller, but continues to accumulate slough. Electronic Signature(s) Signed: 01/19/2023 1:25:32 PM By: Duanne Guess MD FACS Entered By: Duanne Guess on 01/19/2023 13:25:31 -------------------------------------------------------------------------------- Physical Exam Details Patient Name: Date of Service: [name] 01/19/2023 12:30 PM Medical Record Number: [id number] Patient Account Number: [id number] [name], [name] ([id number]) [id number] Page 4 of 12 Date of Birth/Sex: Treating RN: 11-12-1935 (87 y.o. M) Primary Care Provider: Other Clinician: Willow Ora Referring Provider: Treating Provider/Extender: Cresenciano Genre in Treatment: 23 Constitutional Hypertensive, asymptomatic. . . . no acute distress. Respiratory Normal work of breathing on room air. Notes 01/19/2023: Both legs continue to improve. The drainage from the right leg was malodorous today and is also blue-green in color. The left leg wound is considerably smaller, but continues to accumulate slough. Electronic Signature(s) Signed: 01/19/2023 1:26:48 PM By: Duanne Guess MD FACS Entered By: Duanne Guess on 01/19/2023 13:26:48 -------------------------------------------------------------------------------- Physician Orders Details Patient Name: Date of Service: [name] 01/19/2023 12:30 PM Medical Record Number: [id number] Patient Account Number: [id number] Date of Birth/Sex: Treating RN: 1936/06/22 (87 y.o. [name] Primary Care Provider: Willow Ora Other Clinician: Referring Provider: Treating Provider/Extender: Cresenciano Genre in Treatment: 23 Verbal / Phone Orders: No Diagnosis Coding ICD-10 Coding Code  Description [phone] Non-pressure chronic ulcer of left calf with fat layer exposed L97.811 Non-pressure chronic ulcer of other part of right lower leg limited to breakdown of skin L98.9 Disorder of the skin and subcutaneous tissue, unspecified I87.2 Venous insufficiency (chronic) (peripheral) I10 Essential (primary) hypertension Follow-up Appointments ppointment in 1 week. - Dr. Lady Gary - Room 2 Return A Anesthetic (In clinic) Topical Lidocaine 4% applied to wound bed Bathing/ Shower/ Hygiene May shower with protection but do not get wound dressing(s) wet. Protect dressing(s) with water repellant cover (for example, large plastic bag) or a cast cover and may then take shower. Edema Control - Lymphedema / SCD / Other Elevate legs to the level of the heart or above for 30 minutes daily and/or when sitting for 3-4 times a day throughout the day. - Elevate legs throughout the day. When not walking, elevate legs. Avoid standing for long periods of time. Exercise regularly - including ankle circles while sitting Moisturize legs daily. - both legs nightly after removing juxtalites Compression stocking or Garment 20-30 mm/Hg pressure to: - both legs daily Wound Treatment Wound #3 - Lower Leg Wound Laterality: Left, Posterior Cleanser: Soap and Water 1 x Per Day/30 Days Discharge Instructions: May shower and wash wound with dial antibacterial soap and water prior to dressing change. Cleanser: Wound Cleanser 1 x Per Day/30 Days Discharge Instructions: Cleanse the wound with wound cleanser prior to applying a clean dressing using gauze sponges, not tissue or cotton balls. Peri-Wound Care: Sween Lotion (Moisturizing lotion) 1 x Per Day/30 Days Discharge Instructions: Apply moisturizing lotion as directed Prim Dressing: Promogran Prisma Matrix, 4.34 (sq in) (silver collagen) 1 x Per Day/30 Days ary Discharge Instructions: Moisten collagen with saline or hydrogel [name], [name] ([id number])  [id number] Page 5 of 12 Secondary Dressing: ABD Pad, 5x9 1 x Per Day/30 Days Discharge Instructions: Apply over primary dressing as directed. Secondary Dressing: Woven Gauze Sponge, Non-Sterile 4x4 in 1 x Per Day/30 Days Discharge Instructions: Apply over primary dressing as directed. Compression Wrap: Urgo K2 Lite, two layer compression system,  regular 1 x Per Day/30 Days Discharge Instructions: Apply Urgo K2 Lite as directed (alternative to 3 layer compression). Wound #5 - Lower Leg Wound Laterality: Right, Medial Cleanser: Soap and Water 1 x Per Day/30 Days Discharge Instructions: May shower and wash wound with dial antibacterial soap and water prior to dressing change. Cleanser: Wound Cleanser 1 x Per Day/30 Days Discharge Instructions: Cleanse the wound with wound cleanser prior to applying a clean dressing using gauze sponges, not tissue or cotton balls. Peri-Wound Care: Sween Lotion (Moisturizing lotion) 1 x Per Day/30 Days Discharge Instructions: Apply moisturizing lotion as directed Topical: Gentamicin 1 x Per Day/30 Days Discharge Instructions: As directed by physician Prim Dressing: Maxorb Extra Ag+ Alginate Dressing, 4x4.75 (in/in) 1 x Per Day/30 Days ary Discharge Instructions: Apply to wound bed as instructed Secondary Dressing: ABD Pad, 5x9 1 x Per Day/30 Days Discharge Instructions: Apply over primary dressing as directed. Secondary Dressing: Woven Gauze Sponge, Non-Sterile 4x4 in 1 x Per Day/30 Days Discharge Instructions: Apply over primary dressing as directed. Compression Wrap: Urgo K2 Lite, two layer compression system, regular 1 x Per Day/30 Days Discharge Instructions: Apply Urgo K2 Lite as directed (alternative to 3 layer compression). Patient Medications llergies: No Known Allergies A Notifications Medication Indication Start End 01/19/2023 lidocaine DOSE topical 4 % cream - cream topical Electronic Signature(s) Signed: 01/19/2023  3:54:11 PM By: Duanne Guess MD FACS Entered By: Duanne Guess on 01/19/2023 13:27:02 -------------------------------------------------------------------------------- Problem List Details Patient Name: Date of Service: [name] 01/19/2023 12:30 PM Medical Record Number: [id number] Patient Account Number: [id number] Date of Birth/Sex: Treating RN: 06-Sep-1936 (87 y.o. M) Primary Care Provider: Willow Ora Other Clinician: Referring Provider: Treating Provider/Extender: Cresenciano Genre in Treatment: 23 Active Problems ICD-10 Encounter Code Description Active Date MDM Diagnosis L97.222 Non-pressure chronic ulcer of left calf with fat layer exposed 11/06/2022 No Yes L97.811 Non-pressure chronic ulcer of other part of right lower leg limited to breakdown 12/29/2022 No Yes of skin MARVENS, HOLLARS ([id number]) [phone].pdf Page 6 of 12 L98.9 Disorder of the skin and subcutaneous tissue, unspecified 08/11/2022 No Yes I87.2 Venous insufficiency (chronic) (peripheral) 08/11/2022 No Yes I10 Essential (primary) hypertension 08/11/2022 No Yes Inactive Problems Resolved Problems ICD-10 Code Description Active Date Resolved Date L97.821 Non-pressure chronic ulcer of other part of left lower leg limited to breakdown of skin 11/06/2022 11/06/2022 Electronic Signature(s) Signed: 01/19/2023 1:24:29 PM By: Duanne Guess MD FACS Entered By: Duanne Guess on 01/19/2023 13:24:29 -------------------------------------------------------------------------------- Progress Note Details Patient Name: Date of Service: [name] 01/19/2023 12:30 PM Medical Record Number: [id number] Patient Account Number: [id number] Date of Birth/Sex: Treating RN: 1935-10-18 (87 y.o. M) Primary Care Provider: Willow Ora Other Clinician: Referring Provider: Treating Provider/Extender: Cresenciano Genre in Treatment: 23 Subjective Chief  Complaint Information obtained from Patient Left lower extremity wound History of Present Illness (HPI) [name] is an 87 year old male with a past medical history of CAD s/p DES to LCx 2006, carotid artery disease, hypertension, hyperlipidemia, paroxysmal atrial fibrillation on Eliquis and Cardizem that presents today for right lower extremity wound. He was seen by Dr. Drue Novel, his primary care provider on 4/29 for this issue and was referred to our clinic. Patient states that he had a small wound that spontaneously started in October 2021 and has not healed. He states this has progressively gotten larger. He has been using acne cream prescribed by the dermatologist for this issue. He reports drainage to the wound but no  purulent drainage. He reports mild soreness to the wound. He has swelling in his right leg greater than left and reports this is a chronic issue for the past 1 to 2 years. He denies resting leg pain or pain with ambulation. Of note He was prescribed doxycycline at the end of last month and has finished this course for possible skin infection to the wound site. 02/06/2021; I am seeing this patient who was admitted to the clinic last week by Dr. Mikey Bussing. He has predominantly I think a venous insufficiency wound on the right medial lower leg he has been using Santyl Hydrofera Blue under 3 layer compression. Arterial studies were ordered last week but do not seem to been put through. He is not a diabetic. He did have venous reflux studies done in August 2020. He did have abnormal reflux time was noted in the great saphenous vein in the distal thigh and the great saphenous vein at the knee. There was no evidence of DVT or SVT at the time he was not felt to have large enough saphenous veins on either side for intervention. Compression stockings at least knee-high were recommended. Follow-up was on a as needed basis with Dr. Randie Heinz The patient does not describe claudication but his pulses in  his feet are not vibrant this could be because of the swelling 5/26; patient presents for 1 week follow-up. He has been using Hydrofera Blue under 3 layer compression. He had arterial studies done. He has no issues or complaints today. He denies signs of infection. He has his juxta light compressions with him today. 6/3; patient presents for 1 week follow-up. He has been using Hydrofera Blue under 3 layer compression. He denies signs and symptoms of infection. He did have bright green drainage which he states he has not seen before when the dressing was taken off today. He is using his juxta light compression to the left leg. 6/10; patient presents for 1 week follow-up. He has been tolerating Hydrofera Blue with 3 layer compression. He again reports bright green drainage. However he denies any signs of infection. He has been using juxta light compression to the left leg. 6/24; patient presents for 2-week follow-up. He has been tolerating Hydrofera Blue with 3 layer compression. Today he is healed. He brought his juxta lite compression for the right leg and continues to wear the juxta light compression on the left leg READMISSION 05/05/2022 The patient returns to clinic today with a new wound on his left posterior leg. He has been wearing his juxta lite stockings on a regular basis, but on exam he still [name], [name] ([id number]) [phone].pdf Page 7 of 12 has 3+ pitting edema. I suspect he has lymphedema in addition to his known venous reflux. The wounds are scattered geographic wounds with light slough accumulation. They have been applying mupirocin and CeraVe ointment. He does have a VP shunt placement scheduled for next week, but it sounds like his neurosurgeon is not aware of the fact that he has an open wound. 05/11/2022: His VP shunt placement has been postponed while we get his wound to heal. The wounds are all smaller today with just a little eschar and  minimal slough. Edema control is good. No concern for infection. 05/18/2022: All of the open areas have contracted and there is good perimeter epithelialization. There is a little bit of eschar and slough on the open areas. Good edema control. No concern for infection. 05/26/2022: Many of the small open areas have closed. There  is Hydrofera Blue sponge stuck in a number of places and this is unable to be removed by the intake nurse. There is a fair amount of eschar and a little bit of slough present. 06/02/2022: Continued closure of small open skin sites. There are just a couple remaining and these have a little eschar on the surface. Edema control is excellent. 06/09/2022: We are down to just 1 small open area. There is some eschar and slough present. No concern for infection. Edema control is excellent. 06/16/2022: His wound is healed. READMISSION 08/11/2022 Mr. Short underwent a successful VP shunt placement. He returns to clinic today because of a suspicious lesion on his left posterior calf. He actually has no open wounds and his edema control is excellent. He is wearing his juxta lite stockings religiously. On his left posterior calf, there is a raised scaly lesion concerning for potential squamous cell carcinoma. 08/25/2022: The biopsy that I took at his last visit was consistent with a well-differentiated squamous cell carcinoma. He has an appointment at the skin surgery center on December 14. The wound from the biopsy has some slough accumulation, but no concern for infection. 11/06/2022: Since his last visit here, he has undergone Mohs surgery for the squamous cell carcinoma that I biopsied. He has a fairly substantial and deep defect. Apparently the procedure took place between 10 to 12 days ago and he has been in an [employer] ever since. When his Unna boot was removed, he was found to have crusting on his anterior tibial surface with small superficial wounds underneath. 11/16/2022: The  anterior tibial surface wounds seem to largely be closed with just a couple of tiny open areas under some eschar. The Mohs defect is quite a bit cleaner, but still with nonviable subcutaneous tissue present along with slough and eschar, much of which is dried up Iodoflex. 11/24/2022: The anterior tibial wounds are closed. The Mohs defect is cleaner again this week with much less nonviable tissue. 12/11/2022: The Mohs defect continues to contract. There is good granulation tissue underneath a layer of dried up Iodoflex and some slough. 12/21/2022: The wound is smaller again this week, as well as shallower. There is a little bit of slough on the surface. 12/29/2022: His right leg has become massively swollen and he has had skin breakdown occur as a result with a lot of serous drainage. He has not been particularly compliant with compression garment wear, nor has he been elevating his legs. The wound on his left leg is smaller and edema control on the side is very good. 01/07/2023: He has built up some slough and eschar on the right leg, but edema control has improved markedly. The wound on his left leg is much smaller again today with just a little slough on the surface. 01/12/2023: Both legs have improved. There is less open area on the right leg, but there is still slough and eschar accumulation. The left leg wound is smaller again today, but still has slough buildup. 01/19/2023: Both legs continue to improve. The drainage from the right leg was malodorous today and is also blue-green in color. The left leg wound is considerably smaller, but continues to accumulate slough. Patient History Information obtained from Patient. Family History Cancer - Father, Diabetes - Maternal Grandparents, Heart Disease - Maternal Grandparents, Hypertension - Maternal Grandparents, No family history of Hereditary Spherocytosis, Kidney Disease, Lung Disease, Seizures, Stroke, Thyroid Problems, Tuberculosis. Social  History Former smoker - Quit over 30 years ago, Marital Status - Married,  Alcohol Use - Rarely, Drug Use - No History, Caffeine Use - Daily. Medical History Eyes Patient has history of Glaucoma Cardiovascular Patient has history of Arrhythmia - afib, Coronary Artery Disease, Hypertension, Peripheral Venous Disease Endocrine Denies history of Type I Diabetes, Type II Diabetes Genitourinary Denies history of End Stage Renal Disease Integumentary (Skin) Denies history of History of Burn Musculoskeletal Patient has history of Osteoarthritis Oncologic Denies history of Received Chemotherapy, Received Radiation Psychiatric Denies history of Anorexia/bulimia, Confinement Anxiety Hospitalization/Surgery History - lumbar laminectomy. - total knee arthropathy right. - bil total hip arthroplasty. - coronary stents. - toe surgery. - tonsillectomy. - shunt insertion. Medical A Surgical History Notes nd Gastrointestinal colon polyps Endocrine [name], [name] ([id number]) [id number] Page 8 of 12 Hypothyroidism Genitourinary BPH Musculoskeletal Bilat Hip Replacements, Right Knee Replacement, lumbar stenosis Neurologic hydrocephalus Oncologic Skin Cancer multiple sites Objective Constitutional Hypertensive, asymptomatic. no acute distress. Vitals Time Taken: 12:37 PM, Height: 69 in, Weight: 198 lbs, BMI: 29.2, Temperature: 98.1 F, Pulse: 80 bpm, Respiratory Rate: 16 breaths/min, Blood Pressure: 166/71 mmHg. Respiratory Normal work of breathing on room air. General Notes: 01/19/2023: Both legs continue to improve. The drainage from the right leg was malodorous today and is also blue-green in color. The left leg wound is considerably smaller, but continues to accumulate slough. Integumentary (Hair, Skin) Wound #3 status is Open. Original cause of wound was Puncture. The date acquired was: 08/12/2022. The wound has been in treatment 21 weeks. The wound  is located on the Left,Posterior Lower Leg. The wound measures 0.5cm length x 0.5cm width x 0.1cm depth; 0.196cm^2 area and 0.02cm^3 volume. There is Fat Layer (Subcutaneous Tissue) exposed. There is no tunneling or undermining noted. There is a medium amount of serosanguineous drainage noted. The wound margin is distinct with the outline attached to the wound base. There is large (67-100%) red granulation within the wound bed. There is a small (1-33%) amount of necrotic tissue within the wound bed including Adherent Slough. The periwound skin appearance had no abnormalities noted for texture. The periwound skin appearance had no abnormalities noted for moisture. The periwound skin appearance exhibited: Hemosiderin Staining. Periwound temperature was noted as No Abnormality. Wound #5 status is Open. Original cause of wound was Gradually Appeared. The date acquired was: 12/29/2022. The wound has been in treatment 3 weeks. The wound is located on the Right,Medial Lower Leg. The wound measures 2.5cm length x 1cm width x 0.1cm depth; 1.963cm^2 area and 0.196cm^3 volume. There is Fat Layer (Subcutaneous Tissue) exposed. There is no tunneling or undermining noted. There is a medium amount of serous drainage noted. The wound margin is distinct with the outline attached to the wound base. There is medium (34-66%) red, pink granulation within the wound bed. There is a medium (34-66%) amount of necrotic tissue within the wound bed including Eschar and Adherent Slough. The periwound skin appearance had no abnormalities noted for texture. The periwound skin appearance exhibited: Dry/Scaly, Hemosiderin Staining. Periwound temperature was noted as No Abnormality. 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 There was a Excisional Skin/Subcutaneous Tissue Debridement with a total area of 0.2 sq cm performed by Duanne Guess, MD. With the following instrument(s):  Curette to remove Non-Viable tissue/material. Material removed includes Subcutaneous Tissue and Slough and after achieving pain control using Lidocaine 4% T opical Solution. No specimens were taken. A time out was conducted at 12:53, prior to the start of the procedure. A Minimum amount of bleeding was controlled with Pressure. The procedure was tolerated well. Post Debridement Measurements: 0.5cm length x 0.5cm width x 0.1cm depth; 0.02cm^3 volume. Character of Wound/Ulcer Post Debridement is improved. Post procedure Diagnosis Wound #3: Same as Pre-Procedure General Notes: Scribed for Dr Lady Gary by Samuella Bruin, RN. Pre-procedure diagnosis of Wound #3 is a Lesion located on the Left,Posterior Lower Leg . There was a Double Layer Compression Therapy Procedure by Samuella Bruin, RN. Post procedure Diagnosis Wound #3: Same as Pre-Procedure [name], [name] ([id number]) [id number] Page 9 of 12 Wound #5 Pre-procedure diagnosis of Wound #5 is a Lymphedema located on the Right,Medial Lower Leg . There was a Selective/Open Wound Non-Viable Tissue Debridement with a total area of 12.56 sq cm performed by Duanne Guess, MD. With the following instrument(s): Curette to remove Non-Viable tissue/material. Material removed includes Eschar and Slough and after achieving pain control using Lidocaine 4% Topical Solution. No specimens were taken. A time out was conducted at 12:53, prior to the start of the procedure. A Minimum amount of bleeding was controlled with Pressure. The procedure was tolerated well. Post Debridement Measurements:  4cm length x 4cm width x 0.1cm depth; 1.257cm^3 volume. Character of Wound/Ulcer Post Debridement is improved. Post procedure Diagnosis Wound #5: Same as Pre-Procedure General Notes: Scribed for Dr Lady Gary by Samuella Bruin, RN. Pre-procedure diagnosis of Wound #5 is a Lymphedema located on the Right,Medial Lower Leg . There was a Double Layer Compression Therapy Procedure by Samuella Bruin, RN. Post procedure Diagnosis Wound #5: Same as Pre-Procedure Plan Follow-up Appointments: Return Appointment in 1 week. - Dr. Lady Gary - Room 2 Anesthetic: (In clinic) Topical Lidocaine 4% applied to wound bed Bathing/ Shower/ Hygiene: May shower with protection but do not get wound dressing(s) wet. Protect dressing(s) with water repellant cover (for example, large plastic bag) or a cast cover and may then take shower. Edema Control - Lymphedema / SCD / Other: Elevate legs to the level of the heart or above for 30 minutes daily and/or when sitting for 3-4 times a day throughout the day. - Elevate legs throughout the day. When not walking, elevate legs. Avoid standing for long periods of time. Exercise regularly - including ankle circles while sitting Moisturize legs daily. - both legs nightly after removing juxtalites Compression stocking or Garment 20-30 mm/Hg pressure to: - both legs daily The following medication(s) was prescribed: lidocaine topical 4 % cream cream topical was prescribed at facility WOUND #3: - Lower Leg Wound Laterality: Left, Posterior Cleanser: Soap and Water 1 x Per Day/30 Days Discharge Instructions: May shower and wash wound with dial antibacterial soap and water prior to dressing change. Cleanser: Wound Cleanser 1 x Per Day/30 Days Discharge Instructions: Cleanse the wound with wound cleanser prior to applying a clean dressing using gauze sponges, not tissue or cotton balls. Peri-Wound Care: Sween Lotion (Moisturizing lotion) 1 x Per Day/30 Days Discharge Instructions:  Apply moisturizing lotion as directed Prim Dressing: Promogran Prisma Matrix, 4.34 (sq in) (silver collagen) 1 x Per Day/30 Days ary Discharge Instructions: Moisten collagen with saline or hydrogel Secondary Dressing: ABD Pad, 5x9 1 x Per Day/30 Days Discharge Instructions: Apply over primary dressing as directed. Secondary Dressing: Woven Gauze Sponge, Non-Sterile 4x4 in 1 x Per Day/30 Days  Discharge Instructions: Apply over primary dressing as directed. Com pression Wrap: Urgo K2 Lite, two layer compression system, regular 1 x Per Day/30 Days Discharge Instructions: Apply Urgo K2 Lite as directed (alternative to 3 layer compression). WOUND #5: - Lower Leg Wound Laterality: Right, Medial Cleanser: Soap and Water 1 x Per Day/30 Days Discharge Instructions: May shower and wash wound with dial antibacterial soap and water prior to dressing change. Cleanser: Wound Cleanser 1 x Per Day/30 Days Discharge Instructions: Cleanse the wound with wound cleanser prior to applying a clean dressing using gauze sponges, not tissue or cotton balls. Peri-Wound Care: Sween Lotion (Moisturizing lotion) 1 x Per Day/30 Days Discharge Instructions: Apply moisturizing lotion as directed Topical: Gentamicin 1 x Per Day/30 Days Discharge Instructions: As directed by physician Prim Dressing: Maxorb Extra Ag+ Alginate Dressing, 4x4.75 (in/in) 1 x Per Day/30 Days ary Discharge Instructions: Apply to wound bed as instructed Secondary Dressing: ABD Pad, 5x9 1 x Per Day/30 Days Discharge Instructions: Apply over primary dressing as directed. Secondary Dressing: Woven Gauze Sponge, Non-Sterile 4x4 in 1 x Per Day/30 Days Discharge Instructions: Apply over primary dressing as directed. Com pression Wrap: Urgo K2 Lite, two layer compression system, regular 1 x Per Day/30 Days Discharge Instructions: Apply Urgo K2 Lite as directed (alternative to 3 layer compression). 01/19/2023: Both legs continue to improve. The drainage  from the right leg was malodorous today and is also blue-green in color. The left leg wound is considerably smaller, but continues to accumulate slough. I used a curette to debride slough and eschar from the right leg wounds and slough and subcutaneous tissue from the left leg wound. Due to the odor and blue- green discoloration to the drainage, I am going to add topical gentamicin to the right leg under the silver alginate. Continue Prisma silver collagen to the left leg. Continue bilateral 3 layer compression or equivalent. Follow-up in 1 week. Electronic Signature(s) Signed: 01/19/2023 1:31:44 PM By: Duanne Guess MD FACS Entered By: Duanne Guess on 01/19/2023 13:31:44 [name], [name] ([id number]) [id number] Page 10 of 12 -------------------------------------------------------------------------------- HxROS Details Patient Name: Date of Service: [name] 01/19/2023 12:30 PM Medical Record Number: [id number] Patient Account Number: [id number] Date of Birth/Sex: Treating RN: [birth year] (87 y.o. M) Primary Care Provider: Willow Ora Other Clinician: Referring Provider: Treating Provider/Extender: Cresenciano Genre in Treatment: 23 Information Obtained From Patient Eyes Medical History: Positive for: Glaucoma Cardiovascular Medical History: Positive for: Arrhythmia - afib; Coronary Artery Disease; Hypertension; Peripheral Venous Disease Gastrointestinal Medical History: Past Medical History Notes: colon polyps Endocrine Medical History: Negative for: Type I Diabetes; Type II Diabetes Past Medical History Notes: Hypothyroidism Genitourinary Medical History: Negative for: End Stage Renal Disease Past Medical History Notes: BPH Integumentary (Skin) Medical History: Negative for: History of Burn Musculoskeletal Medical History: Positive for: Osteoarthritis Past Medical History Notes: Bilat Hip Replacements, Right Knee  Replacement, lumbar stenosis Neurologic Medical History: Past Medical History Notes: hydrocephalus Oncologic Medical History: Negative for: Received Chemotherapy; Received Radiation Past Medical History Notes: Skin Cancer multiple sites Psychiatric Medical History: Negative for: Anorexia/bulimia; Confinement Anxiety HBO Extended History Items Eyes: Glaucoma Immunizations [name], [name] ([id number]) [id number] Page 11 of 12 Pneumococcal Vaccine: Received Pneumococcal Vaccination: Yes Received Pneumococcal Vaccination On or After 60th Birthday: Yes Implantable Devices None Hospitalization / Surgery History Type of Hospitalization/Surgery lumbar laminectomy total knee arthropathy right bil total hip arthroplasty coronary stents toe surgery tonsillectomy shunt insertion Family and Social History Cancer: Yes - Father; Diabetes: Yes -  Maternal Grandparents; Heart Disease: Yes - Maternal Grandparents; Hereditary Spherocytosis: No; Hypertension: Yes - Maternal Grandparents; Kidney Disease: No; Lung Disease: No; Seizures: No; Stroke: No; Thyroid Problems: No; Tuberculosis: No; Former smoker - Quit over 30 years ago; Marital Status - Married; Alcohol Use: Rarely; Drug Use: No History; Caffeine Use: Daily; Financial Concerns: No; Food, Clothing or Shelter Needs: No; Support System Lacking: No; Transportation Concerns: No Psychologist, prison and probation services) Signed: 01/19/2023 3:54:11 PM By: Duanne Guess MD FACS Entered By: Duanne Guess on 01/19/2023 13:25:47 -------------------------------------------------------------------------------- SuperBill Details Patient Name: Date of Service: [name] 01/19/2023 Medical Record Number: [id number] Patient Account Number: [id number] Date of Birth/Sex: Treating RN: [birth year] (87 y.o. M) Primary Care Provider: Willow Ora Other Clinician: Referring Provider: Treating Provider/Extender: Elsie Saas Weeks in Treatment: 23 Diagnosis Coding ICD-10 Codes Code Description [phone] Non-pressure chronic ulcer of left calf with fat layer exposed L97.811 Non-pressure chronic ulcer of other part of right lower leg limited to breakdown of skin L98.9 Disorder of the skin and subcutaneous tissue, unspecified I87.2 Venous insufficiency (chronic) (peripheral) I10 Essential (primary) hypertension Facility Procedures : CPT4 Code: [id number] Description: 11042 - DEB SUBQ TISSUE 20 SQ CM/< ICD-10 Diagnosis Description L97.222 Non-pressure chronic ulcer of left calf with fat layer exposed Modifier: Quantity: 1 : CPT4 Code: [id number] Description: 97597 - DEBRIDE WOUND 1ST 20 SQ CM OR < ICD-10 Diagnosis Description L97.811 Non-pressure chronic ulcer of other part of right lower leg limited to breakdown Modifier: of skin Quantity: 1 Physician Procedures : CPT4 Code Description Modifier [id number] 99214 - WC PHYS LEVEL 4 - EST PT 25 ICD-10 Diagnosis Description L97.222 Non-pressure chronic ulcer of left calf with fat layer exposed L97.811 Non-pressure chronic ulcer of other part of right lower leg limited  to breakdown of skin I87.2 Venous insufficiency (chronic) (peripheral) L98.9 Disorder of the skin and subcutaneous tissue, unspecified Quantity: 1 : [id number] 11042 - WC PHYS SUBQ TISS 20 SQ CM [name], [name] ([id number]) [id number]. ICD-10 Diagnosis Description L97.222 Non-pressure chronic ulcer of left calf with fat layer exposed Quantity: 1 pdf Page 12 of 12 : [id number] 97597 - WC PHYS DEBR WO ANESTH 20 SQ CM 1 ICD-10 Diagnosis Description L97.811 Non-pressure chronic ulcer of other part of right lower leg limited to breakdown of skin Quantity: Electronic Signature(s) Signed: 01/19/2023 1:33:01 PM By: Duanne Guess MD FACS Entered By: Duanne Guess on 01/19/2023 13:33:00

## 2023-01-26 ENCOUNTER — Encounter (HOSPITAL_BASED_OUTPATIENT_CLINIC_OR_DEPARTMENT_OTHER): Payer: Medicare Other | Attending: General Surgery | Admitting: General Surgery

## 2023-01-26 DIAGNOSIS — L988 Other specified disorders of the skin and subcutaneous tissue: Secondary | ICD-10-CM | POA: Diagnosis not present

## 2023-01-26 DIAGNOSIS — L97222 Non-pressure chronic ulcer of left calf with fat layer exposed: Secondary | ICD-10-CM | POA: Diagnosis not present

## 2023-01-26 DIAGNOSIS — L989 Disorder of the skin and subcutaneous tissue, unspecified: Secondary | ICD-10-CM | POA: Diagnosis not present

## 2023-01-26 DIAGNOSIS — I10 Essential (primary) hypertension: Secondary | ICD-10-CM | POA: Diagnosis not present

## 2023-01-26 DIAGNOSIS — L97812 Non-pressure chronic ulcer of other part of right lower leg with fat layer exposed: Secondary | ICD-10-CM | POA: Insufficient documentation

## 2023-01-26 DIAGNOSIS — I872 Venous insufficiency (chronic) (peripheral): Secondary | ICD-10-CM | POA: Diagnosis not present

## 2023-01-26 DIAGNOSIS — Z7901 Long term (current) use of anticoagulants: Secondary | ICD-10-CM | POA: Diagnosis not present

## 2023-01-26 DIAGNOSIS — I480 Paroxysmal atrial fibrillation: Secondary | ICD-10-CM | POA: Diagnosis not present

## 2023-01-26 DIAGNOSIS — Z955 Presence of coronary angioplasty implant and graft: Secondary | ICD-10-CM | POA: Diagnosis not present

## 2023-01-26 DIAGNOSIS — I2510 Atherosclerotic heart disease of native coronary artery without angina pectoris: Secondary | ICD-10-CM | POA: Insufficient documentation

## 2023-01-26 DIAGNOSIS — Z96643 Presence of artificial hip joint, bilateral: Secondary | ICD-10-CM | POA: Diagnosis not present

## 2023-01-26 DIAGNOSIS — Z87891 Personal history of nicotine dependence: Secondary | ICD-10-CM | POA: Insufficient documentation

## 2023-01-26 DIAGNOSIS — Z8249 Family history of ischemic heart disease and other diseases of the circulatory system: Secondary | ICD-10-CM | POA: Insufficient documentation

## 2023-01-26 DIAGNOSIS — I890 Lymphedema, not elsewhere classified: Secondary | ICD-10-CM | POA: Diagnosis not present

## 2023-01-26 NOTE — Progress Notes (Signed)
RAWN, LEVERETTE ([id number]) [id number] Page 1 of 9 Visit Report for 01/26/2023 Arrival Information Details Patient Name: Date of Service: [name], [name] 01/26/2023 12:45 PM Medical Record Number: [id number] Patient Account Number: [id number] Date of Birth/Sex: Treating RN: 1936/07/14 (87 y.o. [name] Primary Care Resean Brander: Willow Ora Other Clinician: Referring Corinda Ammon: Treating Marwan Lipe/Extender: Cresenciano Genre in Treatment: 24 Visit Information History Since Last Visit Added or deleted any medications: No Patient Arrived: Gilmer Mor Any new allergies or adverse reactions: No Arrival Time: 12:48 Had a fall or experienced change in No Accompanied By: wife activities of daily living that may affect Transfer Assistance: None risk of falls: Patient Identification Verified: Yes Signs or symptoms of abuse/neglect since last visito No Secondary Verification Process Completed: Yes Hospitalized since last visit: No Patient Requires Transmission-Based Precautions: No Implantable device outside of the clinic excluding No Patient Has Alerts: No cellular tissue based products placed in the center since last visit: Has Dressing in Place as Prescribed: Yes Has Compression in Place as Prescribed: Yes Pain Present Now: No Electronic Signature(s) Signed: 01/26/2023 3:53:37 PM By: Samuella Bruin Entered By: Samuella Bruin on 01/26/2023 12:48:46 -------------------------------------------------------------------------------- Compression Therapy Details Patient Name: Date of Service: [name], [name] 01/26/2023 12:45 PM Medical Record Number: [id number] Patient Account Number: [id number] Date of Birth/Sex: Treating RN: 1936/02/03 (87 y.o. [name] Primary Care Eilyn Polack: Willow Ora Other Clinician: Referring Maeryn Mcgath: Treating Sorcha Rotunno/Extender: Cresenciano Genre in Treatment: 24 Compression Therapy Performed  for Wound Assessment: Wound #3 Left,Posterior Lower Leg Performed By: Clinician Samuella Bruin, RN Compression Type: Double Layer Post Procedure Diagnosis Same as Pre-procedure Electronic Signature(s) Signed: 01/26/2023 3:53:37 PM By: Gelene Mink By: Samuella Bruin on 01/26/2023 13:11:18 -------------------------------------------------------------------------------- Compression Therapy Details Patient Name: Date of Service: [name], [name] 01/26/2023 12:45 PM Medical Record Number: [id number] Patient Account Number: [id number] Date of Birth/Sex: Treating RN: [birth year] (87 y.o. [name] Primary Care Shawntrice Salle: Willow Ora Other Clinician: Referring Shadow Schedler: Treating Jahliyah Trice/Extender: Cresenciano Genre in Treatment: 24 Compression Therapy Performed for Wound Assessment: Wound #5 Right,Medial Lower Leg Performed By: Clinician Samuella Bruin, RN Compression Type: Double Layer Post Procedure Diagnosis Same as Pre-procedure GRANTHAM, BIXLER E ([id number]) [id number] Page 2 of 9 Electronic Signature(s) Signed: 01/26/2023 3:53:37 PM By: Gelene Mink By: Samuella Bruin on 01/26/2023 13:11:47 -------------------------------------------------------------------------------- Encounter Discharge Information Details Patient Name: Date of Service: [name], [name] 01/26/2023 12:45 PM Medical Record Number: [id number] Patient Account Number: [id number] Date of Birth/Sex: Treating RN: [birth year] (87 y.o. [name] Primary Care Islah Eve: Willow Ora Other Clinician: Referring Kaidynce Pfister: Treating Meric Joye/Extender: Cresenciano Genre in Treatment: 24 Encounter Discharge Information Items Post Procedure Vitals Discharge Condition: Stable Temperature (F): 98 Ambulatory Status: Cane Pulse (bpm): 68 Discharge Destination: Home Respiratory Rate (breaths/min): 20 Transportation:  Private Auto Blood Pressure (mmHg): 146/75 Accompanied By: wife Schedule Follow-up Appointment: Yes Clinical Summary of Care: Patient Declined Electronic Signature(s) Signed: 01/26/2023 3:53:37 PM By: Samuella Bruin Entered By: Samuella Bruin on 01/26/2023 13:12:35 -------------------------------------------------------------------------------- Lower Extremity Assessment Details Patient Name: Date of Service: [name], [name] 01/26/2023 12:45 PM Medical Record Number: [id number] Patient Account Number: [id number] Date of Birth/Sex: Treating RN: [birth year] (87 y.o. [name] Primary Care Genesis Paget: Willow Ora Other Clinician: Referring Gerre Ranum: Treating Xzaiver Vayda/Extender: Elsie Saas Weeks in Treatment: 24 Edema Assessment Assessed: [Left: No] [Right: No] [Left: Edema] [Right: :] Calf Left: Right: Point of Measurement: From  Medial Instep 34.2 cm 35.5 cm Ankle Left: Right: Point of Measurement: From Medial Instep 21.8 cm 24 cm Vascular Assessment Pulses: Dorsalis Pedis Palpable: [Left:Yes] [Right:Yes] Electronic Signature(s) Signed: 01/26/2023 3:53:37 PM By: Samuella Bruin Entered By: Samuella Bruin on 01/26/2023 12:58:03 Multi Wound Chart Details -------------------------------------------------------------------------------- Shella Spearing ([id number]) [id number] Page 3 of 9 Patient Name: Date of Service: [name], [name] 01/26/2023 12:45 PM Medical Record Number: [id number] Patient Account Number: [id number] Date of Birth/Sex: Treating RN: 1936-06-14 (87 y.o. M) Primary Care Charan Prieto: Willow Ora Other Clinician: Referring Simisola Sandles: Treating Veralyn Lopp/Extender: Cresenciano Genre in Treatment: 24 Vital Signs Height(in): 69 Pulse(bpm): 68 Weight(lbs): 198 Blood Pressure(mmHg): 146/75 Body Mass Index(BMI): 29.2 Temperature(F): 98 Respiratory Rate(breaths/min): 20 Wound Assessments Wound  Number: 3 5 N/A Photos: N/A Left, Posterior Lower Leg Right, Medial Lower Leg N/A Wound Location: Puncture Gradually Appeared N/A Wounding Event: Lesion Lymphedema N/A Primary Etiology: Glaucoma, Arrhythmia, Coronary Glaucoma, Arrhythmia, Coronary N/A Comorbid History: Artery Disease, Hypertension, Artery Disease, Hypertension, Peripheral Venous Disease, Peripheral Venous Disease, Osteoarthritis Osteoarthritis 08/12/2022 12/29/2022 N/A Date Acquired: 22 4 N/A Weeks of Treatment: Open Open N/A Wound Status: No No N/A Wound Recurrence: 0.3x0.3x0.1 0.5x0.5x0.1 N/A Measurements L x W x D (cm) 0.071 0.196 N/A A (cm) : rea 0.007 0.02 N/A Volume (cm) : 0.00% 99.80% N/A % Reduction in A rea: 50.00% 99.80% N/A % Reduction in Volume: Full Thickness Without Exposed Full Thickness Without Exposed N/A Classification: Support Structures Support Structures Medium Medium N/A Exudate A mount: Serosanguineous Serous N/A Exudate Type: red, brown amber N/A Exudate Color: Distinct, outline attached Distinct, outline attached N/A Wound Margin: Small (1-33%) Small (1-33%) N/A Granulation A mount: Red Red, Pink N/A Granulation Quality: Large (67-100%) Large (67-100%) N/A Necrotic A mount: Eschar Eschar, Adherent Slough N/A Necrotic Tissue: Fat Layer (Subcutaneous Tissue): Yes Fat Layer (Subcutaneous Tissue): Yes N/A Exposed Structures: Fascia: No Fascia: No Tendon: No Tendon: No Muscle: No Muscle: No Joint: No Joint: No Bone: No Bone: No Medium (34-66%) Large (67-100%) N/A Epithelialization: Debridement - Selective/Open Wound Debridement - Selective/Open Wound N/A Debridement: Pre-procedure Verification/Time Out 13:08 13:08 N/A Taken: Lidocaine 4% Topical Solution Lidocaine 4% Topical Solution N/A Pain Control: Necrotic/Eschar Necrotic/Eschar N/A Tissue Debrided: Non-Viable Tissue Non-Viable Tissue N/A Level: 0.07 11.78 N/A Debridement A (sq cm): rea Curette  Curette N/A Instrument: Minimum Minimum N/A Bleeding: Pressure Pressure N/A Hemostasis A chieved: Procedure was tolerated well Procedure was tolerated well N/A Debridement Treatment Response: 0.3x0.3x0.1 5x3x0.1 N/A Post Debridement Measurements L x W x D (cm) 0.007 1.178 N/A Post Debridement Volume: (cm) No Abnormalities Noted No Abnormalities Noted N/A Periwound Skin Texture: No Abnormalities Noted Dry/Scaly: Yes N/A Periwound Skin Moisture: Hemosiderin Staining: Yes Hemosiderin Staining: Yes N/A Periwound Skin Color: No Abnormality No Abnormality N/A Temperature: Compression Therapy Compression Therapy N/A Procedures Performed: Debridement Debridement Treatment Notes HUNNER, BROWNSBERGER ([id number]) [id number] Page 4 of 9 Wound #3 (Lower Leg) Wound Laterality: Left, Posterior Cleanser Soap and Water Discharge Instruction: May shower and wash wound with dial antibacterial soap and water prior to dressing change. Wound Cleanser Discharge Instruction: Cleanse the wound with wound cleanser prior to applying a clean dressing using gauze sponges, not tissue or cotton balls. Peri-Wound Care Sween Lotion (Moisturizing lotion) Discharge Instruction: Apply moisturizing lotion as directed Topical Primary Dressing Promogran Prisma Matrix, 4.34 (sq in) (silver collagen) Discharge Instruction: Moisten collagen with saline or hydrogel Secondary Dressing ABD Pad, 5x9 Discharge Instruction: Apply over primary dressing as directed. Woven Gauze Sponge, Non-Sterile 4x4 in  Discharge Instruction: Apply over primary dressing as directed. Secured With Compression Wrap Urgo K2 Lite, two layer compression system, regular Discharge Instruction: Apply Urgo K2 Lite as directed (alternative to 3 layer compression). Compression Stockings Add-Ons Wound #5 (Lower Leg) Wound Laterality: Right, Medial Cleanser Soap and Water Discharge Instruction: May shower and wash  wound with dial antibacterial soap and water prior to dressing change. Wound Cleanser Discharge Instruction: Cleanse the wound with wound cleanser prior to applying a clean dressing using gauze sponges, not tissue or cotton balls. Peri-Wound Care Sween Lotion (Moisturizing lotion) Discharge Instruction: Apply moisturizing lotion as directed Topical Gentamicin Discharge Instruction: As directed by physician Primary Dressing Maxorb Extra Ag+ Alginate Dressing, 4x4.75 (in/in) Discharge Instruction: Apply to wound bed as instructed Secondary Dressing ABD Pad, 5x9 Discharge Instruction: Apply over primary dressing as directed. Woven Gauze Sponge, Non-Sterile 4x4 in Discharge Instruction: Apply over primary dressing as directed. Secured With Compression Wrap Urgo K2 Lite, two layer compression system, regular Discharge Instruction: Apply Urgo K2 Lite as directed (alternative to 3 layer compression). Compression Stockings Add-Ons Electronic Signature(s) Signed: 01/26/2023 2:03:15 PM By: Duanne Guess MD FACS BRYON, MACGILLIVRAY [phone] By: Duanne Guess MD FACS [phone].pdf Page 5 of 9 Signed: 01/26/2023 2:03:15 Entered By: Duanne Guess on 01/26/2023 14:03:15 -------------------------------------------------------------------------------- Multi-Disciplinary Care Plan Details Patient Name: Date of Service: [name], [name] 01/26/2023 12:45 PM Medical Record Number: [id number] Patient Account Number: [id number] Date of Birth/Sex: Treating RN: October 04, 1935 (87 y.o. [name] Primary Care Beverly Ferner: Willow Ora Other Clinician: Referring Conley Pawling: Treating Raed Schalk/Extender: Cresenciano Genre in Treatment: 24 Active Inactive Necrotic Tissue Nursing Diagnoses: Impaired tissue integrity related to necrotic/devitalized tissue Knowledge deficit related to management of necrotic/devitalized tissue Goals: Necrotic/devitalized tissue  will be minimized in the wound bed Date Initiated: 08/25/2022 Target Resolution Date: 06/19/2023 Goal Status: Active Patient/caregiver will verbalize understanding of reason and process for debridement of necrotic tissue Date Initiated: 08/25/2022 Target Resolution Date: 06/19/2023 Goal Status: Active Interventions: Assess patient pain level pre-, during and post procedure and prior to discharge Provide education on necrotic tissue and debridement process Treatment Activities: Apply topical anesthetic as ordered : 08/25/2022 Notes: Electronic Signature(s) Signed: 01/26/2023 3:53:37 PM By: Samuella Bruin Entered By: Samuella Bruin on 01/26/2023 13:04:34 -------------------------------------------------------------------------------- Pain Assessment Details Patient Name: Date of Service: [name], [name] 01/26/2023 12:45 PM Medical Record Number: [id number] Patient Account Number: [id number] Date of Birth/Sex: Treating RN: 08-Aug-1936 (87 y.o. [name] Primary Care Dessie Delcarlo: Willow Ora Other Clinician: Referring Kairav Russomanno: Treating Yarielis Funaro/Extender: Cresenciano Genre in Treatment: 24 Active Problems Location of Pain Severity and Description of Pain Patient Has Paino No Site Locations Rate the pain. NEGAN, GARNETT ([id number]) [id number] Page 6 of 9 Rate the pain. Current Pain Level: 0 Pain Management and Medication Current Pain Management: Electronic Signature(s) Signed: 01/26/2023 3:53:37 PM By: Samuella Bruin Entered By: Samuella Bruin on 01/26/2023 12:49:35 -------------------------------------------------------------------------------- Patient/Caregiver Education Details Patient Name: Date of Service: [name], [name] 5/7/2024andnbsp12:45 PM Medical Record Number: [id number] Patient Account Number: [id number] Date of Birth/Gender: Treating RN: 09-05-1936 (87 y.o. [name] Primary Care Physician:  Willow Ora Other Clinician: Referring Physician: Treating Physician/Extender: Cresenciano Genre in Treatment: 24 Education Assessment Education Provided To: Patient Education Topics Provided Wound Debridement: Methods: Explain/Verbal Responses: Reinforcements needed, State content correctly Electronic Signature(s) Signed: 01/26/2023 3:53:37 PM By: Samuella Bruin Entered By: Samuella Bruin on 01/26/2023 13:04:47 -------------------------------------------------------------------------------- Wound Assessment Details Patient Name: Date of Service: SHO  RE, [name] 01/26/2023 12:45 PM Medical Record Number: [id number] Patient Account Number: [id number] Date of Birth/Sex: Treating RN: 1936-02-26 (87 y.o. [name] Primary Care Travian Kerner: Willow Ora Other Clinician: Referring Javel Hersh: Treating Sabryna Lahm/Extender: Elsie Saas Weeks in Treatment: 24 Wound Status Wound Number: 3 Primary Lesion Etiology: Wound Location: Left, Posterior Lower Leg Wound Open Wounding Event: Puncture Status: Date Acquired: 08/12/2022 Comorbid Glaucoma, Arrhythmia, Coronary Artery Disease, Hypertension, Weeks Of Treatment: 22 History: Peripheral Venous Disease, Osteoarthritis Clustered Wound: No RHETT, DELHOYO ([id number]) [id number] Page 7 of 9 Photos Wound Measurements Length: (cm) 0.3 Width: (cm) 0.3 Depth: (cm) 0.1 Area: (cm) 0.071 Volume: (cm) 0.007 % Reduction in Area: 0% % Reduction in Volume: 50% Epithelialization: Medium (34-66%) Tunneling: No Undermining: No Wound Description Classification: Full Thickness Without Exposed Support Structures Wound Margin: Distinct, outline attached Exudate Amount: Medium Exudate Type: Serosanguineous Exudate Color: red, brown Foul Odor After Cleansing: No Slough/Fibrino Yes Wound Bed Granulation Amount: Small (1-33%) Exposed Structure Granulation Quality: Red Fascia  Exposed: No Necrotic Amount: Large (67-100%) Fat Layer (Subcutaneous Tissue) Exposed: Yes Necrotic Quality: Eschar Tendon Exposed: No Muscle Exposed: No Joint Exposed: No Bone Exposed: No Periwound Skin Texture Texture Color No Abnormalities Noted: Yes No Abnormalities Noted: No Hemosiderin Staining: Yes Moisture No Abnormalities Noted: Yes Temperature / Pain Temperature: No Abnormality Treatment Notes Wound #3 (Lower Leg) Wound Laterality: Left, Posterior Cleanser Soap and Water Discharge Instruction: May shower and wash wound with dial antibacterial soap and water prior to dressing change. Wound Cleanser Discharge Instruction: Cleanse the wound with wound cleanser prior to applying a clean dressing using gauze sponges, not tissue or cotton balls. Peri-Wound Care Sween Lotion (Moisturizing lotion) Discharge Instruction: Apply moisturizing lotion as directed Topical Primary Dressing Promogran Prisma Matrix, 4.34 (sq in) (silver collagen) Discharge Instruction: Moisten collagen with saline or hydrogel Secondary Dressing ABD Pad, 5x9 Discharge Instruction: Apply over primary dressing as directed. Woven Gauze Sponge, Non-Sterile 4x4 in Discharge Instruction: Apply over primary dressing as directed. Secured With BRONN, ALTIDOR ([id number]) [id number] Page 8 of 9 Compression Wrap Urgo K2 Lite, two layer compression system, regular Discharge Instruction: Apply Urgo K2 Lite as directed (alternative to 3 layer compression). Compression Stockings Add-Ons Electronic Signature(s) Signed: 01/26/2023 3:53:37 PM By: Samuella Bruin Entered By: Samuella Bruin on 01/26/2023 13:01:26 -------------------------------------------------------------------------------- Wound Assessment Details Patient Name: Date of Service: [name], [name] 01/26/2023 12:45 PM Medical Record Number: [id number] Patient Account Number: [id number] Date of Birth/Sex: Treating  RN: 12-Jul-1936 (87 y.o. [name] Primary Care Freman Lapage: Willow Ora Other Clinician: Referring Dimitri Shakespeare: Treating Kalil Woessner/Extender: Elsie Saas Weeks in Treatment: 24 Wound Status Wound Number: 5 Primary Lymphedema Etiology: Wound Location: Right, Medial Lower Leg Wound Open Wounding Event: Gradually Appeared Status: Date Acquired: 12/29/2022 Comorbid Glaucoma, Arrhythmia, Coronary Artery Disease, Hypertension, Weeks Of Treatment: 4 History: Peripheral Venous Disease, Osteoarthritis Clustered Wound: No Photos Wound Measurements Length: (cm) 0.5 Width: (cm) 0.5 Depth: (cm) 0.1 Area: (cm) 0.196 Volume: (cm) 0.02 % Reduction in Area: 99.8% % Reduction in Volume: 99.8% Epithelialization: Large (67-100%) Tunneling: No Undermining: No Wound Description Classification: Full Thickness Without Exposed Suppor Wound Margin: Distinct, outline attached Exudate Amount: Medium Exudate Type: Serous Exudate Color: amber t Structures Foul Odor After Cleansing: No Slough/Fibrino Yes Wound Bed Granulation Amount: Small (1-33%) Exposed Structure Granulation Quality: Red, Pink Fascia Exposed: No Necrotic Amount: Large (67-100%) Fat Layer (Subcutaneous Tissue) Exposed: Yes Necrotic Quality: Eschar, Adherent Slough Tendon Exposed: No Muscle Exposed: No Joint Exposed: No  Bone Exposed: No Periwound Skin Texture Texture Color No Abnormalities Noted: Yes No Abnormalities Noted: No Hemosiderin Staining: [address] YOHANES, KOBS ([id number]) [id number] Page 9 of 9 No Abnormalities Noted: No Temperature / Pain Dry / Scaly: Yes Temperature: No Abnormality Treatment Notes Wound #5 (Lower Leg) Wound Laterality: Right, Medial Cleanser Soap and Water Discharge Instruction: May shower and wash wound with dial antibacterial soap and water prior to dressing change. Wound Cleanser Discharge Instruction: Cleanse the wound with wound  cleanser prior to applying a clean dressing using gauze sponges, not tissue or cotton balls. Peri-Wound Care Sween Lotion (Moisturizing lotion) Discharge Instruction: Apply moisturizing lotion as directed Topical Gentamicin Discharge Instruction: As directed by physician Primary Dressing Maxorb Extra Ag+ Alginate Dressing, 4x4.75 (in/in) Discharge Instruction: Apply to wound bed as instructed Secondary Dressing ABD Pad, 5x9 Discharge Instruction: Apply over primary dressing as directed. Woven Gauze Sponge, Non-Sterile 4x4 in Discharge Instruction: Apply over primary dressing as directed. Secured With Compression Wrap Urgo K2 Lite, two layer compression system, regular Discharge Instruction: Apply Urgo K2 Lite as directed (alternative to 3 layer compression). Compression Stockings Add-Ons Electronic Signature(s) Signed: 01/26/2023 3:53:37 PM By: Samuella Bruin Entered By: Samuella Bruin on 01/26/2023 13:01:50 -------------------------------------------------------------------------------- Vitals Details Patient Name: Date of Service: [name], [name] 01/26/2023 12:45 PM Medical Record Number: [id number] Patient Account Number: [id number] Date of Birth/Sex: Treating RN: September 20, 1936 (87 y.o. [name] Primary Care Tamanna Whitson: Willow Ora Other Clinician: Referring Rylee Huestis: Treating Infinity Jeffords/Extender: Cresenciano Genre in Treatment: 24 Vital Signs Time Taken: 12:48 Temperature (F): 98 Height (in): 69 Pulse (bpm): 68 Weight (lbs): 198 Respiratory Rate (breaths/min): 20 Body Mass Index (BMI): 29.2 Blood Pressure (mmHg): 146/75 Reference Range: 80 - 120 mg / dl Electronic Signature(s) Signed: 01/26/2023 3:53:37 PM By: Samuella Bruin Entered By: Samuella Bruin on 01/26/2023 12:49:29

## 2023-01-26 NOTE — Progress Notes (Signed)
[name], [name] ([id number]) [id number] Page 1 of 12 Visit Report for 01/26/2023 Chief Complaint Document Details Patient Name: Date of Service: [name], [name] 01/26/2023 12:45 PM Medical Record Number: [id number] Patient Account Number: [id number] Date of Birth/Sex: Treating RN: [birth year] (87 y.o. M) Primary Care Provider: Willow Ora Other Clinician: Referring Provider: Treating Provider/Extender: Cresenciano Genre in Treatment: 24 Information Obtained from: Patient Chief Complaint Left lower extremity wound Electronic Signature(s) Signed: 01/26/2023 2:04:03 PM By: Duanne Guess MD FACS Entered By: Duanne Guess on 01/26/2023 14:04:02 -------------------------------------------------------------------------------- Debridement Details Patient Name: Date of Service: [name], [name] 01/26/2023 12:45 PM Medical Record Number: [id number] Patient Account Number: [id number] Date of Birth/Sex: Treating RN: 15-Nov-1935 (87 y.o. [name] Primary Care Provider: Willow Ora Other Clinician: Referring Provider: Treating Provider/Extender: Cresenciano Genre in Treatment: 24 Debridement Performed for Assessment: Wound #3 Left,Posterior Lower Leg Performed By: Physician Duanne Guess, MD Debridement Type: Debridement Level of Consciousness (Pre-procedure): Awake and Alert Pre-procedure Verification/Time Out Yes - 13:08 Taken: Start Time: 13:08 Pain Control: Lidocaine 4% Topical Solution Percent of Wound Bed Debrided: 100% T Area Debrided (cm): otal 0.07 Tissue and other material debrided: Non-Viable, Eschar Level: Non-Viable Tissue Debridement Description: Selective/Open Wound Instrument: Curette Bleeding: Minimum Hemostasis Achieved: Pressure Response to Treatment: Procedure was tolerated well Level of Consciousness (Post- Awake and Alert procedure): Post Debridement Measurements of Total Wound Length: (cm)  0.3 Width: (cm) 0.3 Depth: (cm) 0.1 Volume: (cm) 0.007 Character of Wound/Ulcer Post Debridement: Improved Post Procedure Diagnosis Same as Pre-procedure Notes Scribed for Dr Lady Gary by Samuella Bruin, RN Electronic Signature(s) Signed: 01/26/2023 3:53:37 PM By: Samuella Bruin Signed: 01/26/2023 3:59:13 PM By: Duanne Guess MD FACS Entered By: Samuella Bruin on 01/26/2023 13:11:04 [name] ([id number]) [id number] Page 2 of 12 -------------------------------------------------------------------------------- Debridement Details Patient Name: Date of Service: [name], [name] 01/26/2023 12:45 PM Medical Record Number: [id number] Patient Account Number: [id number] Date of Birth/Sex: Treating RN: 07-May-1936 (87 y.o. [name] Primary Care Provider: Willow Ora Other Clinician: Referring Provider: Treating Provider/Extender: Cresenciano Genre in Treatment: 24 Debridement Performed for Assessment: Wound #5 Right,Medial Lower Leg Performed By: Physician Duanne Guess, MD Debridement Type: Debridement Level of Consciousness (Pre-procedure): Awake and Alert Pre-procedure Verification/Time Out Yes - 13:08 Taken: Start Time: 13:08 Pain Control: Lidocaine 4% Topical Solution Percent of Wound Bed Debrided: 100% T Area Debrided (cm): otal 11.78 Tissue and other material debrided: Non-Viable, Eschar Level: Non-Viable Tissue Debridement Description: Selective/Open Wound Instrument: Curette Bleeding: Minimum Hemostasis Achieved: Pressure Response to Treatment: Procedure was tolerated well Level of Consciousness (Post- Awake and Alert procedure): Post Debridement Measurements of Total Wound Length: (cm) 5 Width: (cm) 3 Depth: (cm) 0.1 Volume: (cm) 1.178 Character of Wound/Ulcer Post Debridement: Improved Post Procedure Diagnosis Same as Pre-procedure Notes Scribed for Dr Lady Gary by Samuella Bruin,  RN Electronic Signature(s) Signed: 01/26/2023 3:53:37 PM By: Samuella Bruin Signed: 01/26/2023 3:59:13 PM By: Duanne Guess MD FACS Entered By: Samuella Bruin on 01/26/2023 13:14:34 -------------------------------------------------------------------------------- HPI Details Patient Name: Date of Service: [name], [name] 01/26/2023 12:45 PM Medical Record Number: [id number] Patient Account Number: [id number] Date of Birth/Sex: Treating RN: July 25, 1936 (87 y.o. M) Primary Care Provider: Willow Ora Other Clinician: Referring Provider: Treating Provider/Extender: Cresenciano Genre in Treatment: 24 History of Present Illness HPI Description: [name] is an 87 year old male with a past medical history of CAD s/p DES to LCx 2006, carotid artery disease, hypertension, hyperlipidemia,  paroxysmal atrial fibrillation on Eliquis and Cardizem that presents today for right lower extremity wound. He was seen by Dr. Drue Novel, his primary care provider on 4/29 for this issue and was referred to our clinic. Patient states that he had a small wound that spontaneously started in October 2021 and has not healed. He states this has progressively gotten larger. He has been using acne cream prescribed by the dermatologist for this issue. He reports drainage to the wound but no purulent drainage. He reports mild soreness to the wound. He has swelling in his right leg greater than left and reports this is a chronic issue for the past 1 to 2 years. He denies resting leg pain or pain with ambulation. Of note He was prescribed doxycycline at the end of last month and has finished this course for possible skin infection to the wound site. 02/06/2021; I am seeing this patient who was admitted to the clinic last week by Dr. Mikey Bussing. He has predominantly I think a venous insufficiency wound on the right medial lower leg he has been using Santyl Hydrofera Blue under 3 layer compression. Arterial studies were  ordered last week but do not seem to been put through. He is not a diabetic. He did have venous reflux studies done in August 2020. He did have abnormal reflux time was noted in the great saphenous vein in the distal thigh and the great saphenous vein at the knee. There was no evidence of DVT or SVT at the time he was not felt to have large enough NATHANE, SABALLOS ([id number]) [id number] Page 3 of 12 saphenous veins on either side for intervention. Compression stockings at least knee-high were recommended. Follow-up was on a as needed basis with Dr. Randie Heinz The patient does not describe claudication but his pulses in his feet are not vibrant this could be because of the swelling 5/26; patient presents for 1 week follow-up. He has been using Hydrofera Blue under 3 layer compression. He had arterial studies done. He has no issues or complaints today. He denies signs of infection. He has his juxta light compressions with him today. 6/3; patient presents for 1 week follow-up. He has been using Hydrofera Blue under 3 layer compression. He denies signs and symptoms of infection. He did have bright green drainage which he states he has not seen before when the dressing was taken off today. He is using his juxta light compression to the left leg. 6/10; patient presents for 1 week follow-up. He has been tolerating Hydrofera Blue with 3 layer compression. He again reports bright green drainage. However he denies any signs of infection. He has been using juxta light compression to the left leg. 6/24; patient presents for 2-week follow-up. He has been tolerating Hydrofera Blue with 3 layer compression. Today he is healed. He brought his juxta lite compression for the right leg and continues to wear the juxta light compression on the left leg READMISSION 05/05/2022 The patient returns to clinic today with a new wound on his left posterior leg. He has been wearing his juxta lite stockings  on a regular basis, but on exam he still has 3+ pitting edema. I suspect he has lymphedema in addition to his known venous reflux. The wounds are scattered geographic wounds with light slough accumulation. They have been applying mupirocin and CeraVe ointment. He does have a VP shunt placement scheduled for next week, but it sounds like his neurosurgeon is not aware of the fact that he has an open  wound. 05/11/2022: His VP shunt placement has been postponed while we get his wound to heal. The wounds are all smaller today with just a little eschar and minimal slough. Edema control is good. No concern for infection. 05/18/2022: All of the open areas have contracted and there is good perimeter epithelialization. There is a little bit of eschar and slough on the open areas. Good edema control. No concern for infection. 05/26/2022: Many of the small open areas have closed. There is Hydrofera Blue sponge stuck in a number of places and this is unable to be removed by the intake nurse. There is a fair amount of eschar and a little bit of slough present. 06/02/2022: Continued closure of small open skin sites. There are just a couple remaining and these have a little eschar on the surface. Edema control is excellent. 06/09/2022: We are down to just 1 small open area. There is some eschar and slough present. No concern for infection. Edema control is excellent. 06/16/2022: His wound is healed. READMISSION 08/11/2022 Mr. Short underwent a successful VP shunt placement. He returns to clinic today because of a suspicious lesion on his left posterior calf. He actually has no open wounds and his edema control is excellent. He is wearing his juxta lite stockings religiously. On his left posterior calf, there is a raised scaly lesion concerning for potential squamous cell carcinoma. 08/25/2022: The biopsy that I took at his last visit was consistent with a well-differentiated squamous cell carcinoma. He has an appointment  at the [facility] surgery center on December 14. The wound from the biopsy has some slough accumulation, but no concern for infection. 11/06/2022: Since his last visit here, he has undergone Mohs surgery for the squamous cell carcinoma that I biopsied. He has a fairly substantial and deep defect. Apparently the procedure took place between 10 to 12 days ago and he has been in an [employer] ever since. When his Unna boot was removed, he was found to have crusting on his anterior tibial surface with small superficial wounds underneath. 11/16/2022: The anterior tibial surface wounds seem to largely be closed with just a couple of tiny open areas under some eschar. The Mohs defect is quite a bit cleaner, but still with nonviable subcutaneous tissue present along with slough and eschar, much of which is dried up Iodoflex. 11/24/2022: The anterior tibial wounds are closed. The Mohs defect is cleaner again this week with much less nonviable tissue. 12/11/2022: The Mohs defect continues to contract. There is good granulation tissue underneath a layer of dried up Iodoflex and some slough. 12/21/2022: The wound is smaller again this week, as well as shallower. There is a little bit of slough on the surface. 12/29/2022: His right leg has become massively swollen and he has had skin breakdown occur as a result with a lot of serous drainage. He has not been particularly compliant with compression garment wear, nor has he been elevating his legs. The wound on his left leg is smaller and edema control on the side is very good. 01/07/2023: He has built up some slough and eschar on the right leg, but edema control has improved markedly. The wound on his left leg is much smaller again today with just a little slough on the surface. 01/12/2023: Both legs have improved. There is less open area on the right leg, but there is still slough and eschar accumulation. The left leg wound is smaller again today, but still has slough  buildup. 01/19/2023: Both legs continue  to improve. The drainage from the right leg was malodorous today and is also blue-green in color. The left leg wound is considerably smaller, but continues to accumulate slough. 01/26/2023: The left leg wound is almost closed underneath the layer of eschar. The right leg wound is no longer malodorous. There is still some crusted up drainage on the surface but much of the open area has epithelialized. Edema control is markedly improved. Electronic Signature(s) Signed: 01/26/2023 2:07:28 PM By: Duanne Guess MD FACS Entered By: Duanne Guess on 01/26/2023 14:07:28 Physical Exam Details -------------------------------------------------------------------------------- Shella Spearing ([id number]) [id number] Page 4 of 12 Patient Name: Date of Service: [name], [name] 01/26/2023 12:45 PM Medical Record Number: [id number] Patient Account Number: [id number] Date of Birth/Sex: Treating RN: 1936-01-09 (87 y.o. M) Primary Care Provider: Willow Ora Other Clinician: Referring Provider: Treating Provider/Extender: Elsie Saas Weeks in Treatment: 24 Constitutional Slightly hypertensive. . . . no acute distress. Respiratory Normal work of breathing on room air. Notes 01/26/2023: The left leg wound is almost closed underneath the layer of eschar. The right leg wound is no longer malodorous. There is still some crusted up drainage on the surface but much of the open area has epithelialized. Edema control is markedly improved. Electronic Signature(s) Signed: 01/26/2023 2:07:56 PM By: Duanne Guess MD FACS Entered By: Duanne Guess on 01/26/2023 14:07:56 -------------------------------------------------------------------------------- Physician Orders Details Patient Name: Date of Service: [name], [name] 01/26/2023 12:45 PM Medical Record Number: [id number] Patient Account Number: [id number] Date of Birth/Sex: Treating  RN: [birth year] (86 y.o. [name] Primary Care Provider: Willow Ora Other Clinician: Referring Provider: Treating Provider/Extender: Cresenciano Genre in Treatment: 24 Verbal / Phone Orders: No Diagnosis Coding ICD-10 Coding Code Description [phone] Non-pressure chronic ulcer of left calf with fat layer exposed L97.811 Non-pressure chronic ulcer of other part of right lower leg limited to breakdown of skin L98.9 Disorder of the skin and subcutaneous tissue, unspecified I87.2 Venous insufficiency (chronic) (peripheral) I10 Essential (primary) hypertension Follow-up Appointments ppointment in 1 week. - Dr. Lady Gary - Room 2 Return A Anesthetic (In clinic) Topical Lidocaine 4% applied to wound bed Bathing/ Shower/ Hygiene May shower with protection but do not get wound dressing(s) wet. Protect dressing(s) with water repellant cover (for example, large plastic bag) or a cast cover and may then take shower. Edema Control - Lymphedema / SCD / Other Elevate legs to the level of the heart or above for 30 minutes daily and/or when sitting for 3-4 times a day throughout the day. - Elevate legs throughout the day. When not walking, elevate legs. Avoid standing for long periods of time. Exercise regularly - including ankle circles while sitting Moisturize legs daily. - both legs nightly after removing juxtalites Compression stocking or Garment 20-30 mm/Hg pressure to: - both legs daily Wound Treatment Wound #3 - Lower Leg Wound Laterality: Left, Posterior Cleanser: Soap and Water 1 x Per Day/30 Days Discharge Instructions: May shower and wash wound with dial antibacterial soap and water prior to dressing change. Cleanser: Wound Cleanser 1 x Per Day/30 Days Discharge Instructions: Cleanse the wound with wound cleanser prior to applying a clean dressing using gauze sponges, not tissue or cotton balls. Peri-Wound Care: Sween Lotion (Moisturizing lotion) 1 x Per Day/30  Days Discharge Instructions: Apply moisturizing lotion as directed Prim Dressing: Promogran Prisma Matrix, 4.34 (sq in) (silver collagen) ary 1 x Per Day/30 Days ANIKIN, LEDIN ([id number]) [id number] Page 5 of 12 Discharge Instructions: Moisten collagen with  saline or hydrogel Secondary Dressing: ABD Pad, 5x9 1 x Per Day/30 Days Discharge Instructions: Apply over primary dressing as directed. Secondary Dressing: Woven Gauze Sponge, Non-Sterile 4x4 in 1 x Per Day/30 Days Discharge Instructions: Apply over primary dressing as directed. Compression Wrap: Urgo K2 Lite, two layer compression system, regular 1 x Per Day/30 Days Discharge Instructions: Apply Urgo K2 Lite as directed (alternative to 3 layer compression). Wound #5 - Lower Leg Wound Laterality: Right, Medial Cleanser: Soap and Water 1 x Per Day/30 Days Discharge Instructions: May shower and wash wound with dial antibacterial soap and water prior to dressing change. Cleanser: Wound Cleanser 1 x Per Day/30 Days Discharge Instructions: Cleanse the wound with wound cleanser prior to applying a clean dressing using gauze sponges, not tissue or cotton balls. Peri-Wound Care: Sween Lotion (Moisturizing lotion) 1 x Per Day/30 Days Discharge Instructions: Apply moisturizing lotion as directed Topical: Gentamicin 1 x Per Day/30 Days Discharge Instructions: As directed by physician Prim Dressing: Maxorb Extra Ag+ Alginate Dressing, 4x4.75 (in/in) 1 x Per Day/30 Days ary Discharge Instructions: Apply to wound bed as instructed Secondary Dressing: ABD Pad, 5x9 1 x Per Day/30 Days Discharge Instructions: Apply over primary dressing as directed. Secondary Dressing: Woven Gauze Sponge, Non-Sterile 4x4 in 1 x Per Day/30 Days Discharge Instructions: Apply over primary dressing as directed. Compression Wrap: Urgo K2 Lite, two layer compression system, regular 1 x Per Day/30 Days Discharge Instructions: Apply Urgo K2  Lite as directed (alternative to 3 layer compression). Patient Medications llergies: No Known Allergies A Notifications Medication Indication Start End 01/26/2023 lidocaine DOSE topical 4 % cream - cream topical Electronic Signature(s) Signed: 01/26/2023 3:59:13 PM By: Duanne Guess MD FACS Entered By: Duanne Guess on 01/26/2023 14:09:03 -------------------------------------------------------------------------------- Problem List Details Patient Name: Date of Service: [name], [name] 01/26/2023 12:45 PM Medical Record Number: [id number] Patient Account Number: [id number] Date of Birth/Sex: Treating RN: 02-12-1936 (87 y.o. M) Primary Care Provider: Willow Ora Other Clinician: Referring Provider: Treating Provider/Extender: Elsie Saas Weeks in Treatment: 24 Active Problems ICD-10 Encounter Code Description Active Date MDM Diagnosis L97.222 Non-pressure chronic ulcer of left calf with fat layer exposed 11/06/2022 No Yes L97.811 Non-pressure chronic ulcer of other part of right lower leg limited to breakdown 12/29/2022 No Yes of skin JOSTYN, GARVERICK ([id number]) [id number] Page 6 of 12 L98.9 Disorder of the skin and subcutaneous tissue, unspecified 08/11/2022 No Yes I87.2 Venous insufficiency (chronic) (peripheral) 08/11/2022 No Yes I10 Essential (primary) hypertension 08/11/2022 No Yes Inactive Problems Resolved Problems ICD-10 Code Description Active Date Resolved Date L97.821 Non-pressure chronic ulcer of other part of left lower leg limited to breakdown of skin 11/06/2022 11/06/2022 Electronic Signature(s) Signed: 01/26/2023 2:03:06 PM By: Duanne Guess MD FACS Entered By: Duanne Guess on 01/26/2023 14:03:06 -------------------------------------------------------------------------------- Progress Note Details Patient Name: Date of Service: [name], [name] 01/26/2023 12:45 PM Medical Record Number: [id number] Patient Account  Number: [id number] Date of Birth/Sex: Treating RN: 1936-05-21 (87 y.o. M) Primary Care Provider: Willow Ora Other Clinician: Referring Provider: Treating Provider/Extender: Cresenciano Genre in Treatment: 24 Subjective Chief Complaint Information obtained from Patient Left lower extremity wound History of Present Illness (HPI) [name] is an 87 year old male with a past medical history of CAD s/p DES to LCx 2006, carotid artery disease, hypertension, hyperlipidemia, paroxysmal atrial fibrillation on Eliquis and Cardizem that presents today for right lower extremity wound. He was seen by Dr. Drue Novel, his primary care provider on 4/29 for this issue and  was referred to our clinic. Patient states that he had a small wound that spontaneously started in October 2021 and has not healed. He states this has progressively gotten larger. He has been using acne cream prescribed by the dermatologist for this issue. He reports drainage to the wound but no purulent drainage. He reports mild soreness to the wound. He has swelling in his right leg greater than left and reports this is a chronic issue for the past 1 to 2 years. He denies resting leg pain or pain with ambulation. Of note He was prescribed doxycycline at the end of last month and has finished this course for possible skin infection to the wound site. 02/06/2021; I am seeing this patient who was admitted to the clinic last week by Dr. Mikey Bussing. He has predominantly I think a venous insufficiency wound on the right medial lower leg he has been using Santyl Hydrofera Blue under 3 layer compression. Arterial studies were ordered last week but do not seem to been put through. He is not a diabetic. He did have venous reflux studies done in August 2020. He did have abnormal reflux time was noted in the great saphenous vein in the distal thigh and the great saphenous vein at the knee. There was no evidence of DVT or SVT at the time he was not  felt to have large enough saphenous veins on either side for intervention. Compression stockings at least knee-high were recommended. Follow-up was on a as needed basis with Dr. Randie Heinz The patient does not describe claudication but his pulses in his feet are not vibrant this could be because of the swelling 5/26; patient presents for 1 week follow-up. He has been using Hydrofera Blue under 3 layer compression. He had arterial studies done. He has no issues or complaints today. He denies signs of infection. He has his juxta light compressions with him today. 6/3; patient presents for 1 week follow-up. He has been using Hydrofera Blue under 3 layer compression. He denies signs and symptoms of infection. He did have bright green drainage which he states he has not seen before when the dressing was taken off today. He is using his juxta light compression to the left leg. 6/10; patient presents for 1 week follow-up. He has been tolerating Hydrofera Blue with 3 layer compression. He again reports bright green drainage. However he denies any signs of infection. He has been using juxta light compression to the left leg. 6/24; patient presents for 2-week follow-up. He has been tolerating Hydrofera Blue with 3 layer compression. Today he is healed. He brought his juxta lite compression for the right leg and continues to wear the juxta light compression on the left leg JAMONI, LARMORE ([id number]) [id number] Page 7 of 12 READMISSION 05/05/2022 The patient returns to clinic today with a new wound on his left posterior leg. He has been wearing his juxta lite stockings on a regular basis, but on exam he still has 3+ pitting edema. I suspect he has lymphedema in addition to his known venous reflux. The wounds are scattered geographic wounds with light slough accumulation. They have been applying mupirocin and CeraVe ointment. He does have a VP shunt placement scheduled for next week, but  it sounds like his neurosurgeon is not aware of the fact that he has an open wound. 05/11/2022: His VP shunt placement has been postponed while we get his wound to heal. The wounds are all smaller today with just a little eschar and minimal slough. Edema  control is good. No concern for infection. 05/18/2022: All of the open areas have contracted and there is good perimeter epithelialization. There is a little bit of eschar and slough on the open areas. Good edema control. No concern for infection. 05/26/2022: Many of the small open areas have closed. There is Hydrofera Blue sponge stuck in a number of places and this is unable to be removed by the intake nurse. There is a fair amount of eschar and a little bit of slough present. 06/02/2022: Continued closure of small open skin sites. There are just a couple remaining and these have a little eschar on the surface. Edema control is excellent. 06/09/2022: We are down to just 1 small open area. There is some eschar and slough present. No concern for infection. Edema control is excellent. 06/16/2022: His wound is healed. READMISSION 08/11/2022 Mr. Short underwent a successful VP shunt placement. He returns to clinic today because of a suspicious lesion on his left posterior calf. He actually has no open wounds and his edema control is excellent. He is wearing his juxta lite stockings religiously. On his left posterior calf, there is a raised scaly lesion concerning for potential squamous cell carcinoma. 08/25/2022: The biopsy that I took at his last visit was consistent with a well-differentiated squamous cell carcinoma. He has an appointment at the skin surgery center on December 14. The wound from the biopsy has some slough accumulation, but no concern for infection. 11/06/2022: Since his last visit here, he has undergone Mohs surgery for the squamous cell carcinoma that I biopsied. He has a fairly substantial and deep defect. Apparently the procedure took  place between 10 to 12 days ago and he has been in an [employer] ever since. When his Unna boot was removed, he was found to have crusting on his anterior tibial surface with small superficial wounds underneath. 11/16/2022: The anterior tibial surface wounds seem to largely be closed with just a couple of tiny open areas under some eschar. The Mohs defect is quite a bit cleaner, but still with nonviable subcutaneous tissue present along with slough and eschar, much of which is dried up Iodoflex. 11/24/2022: The anterior tibial wounds are closed. The Mohs defect is cleaner again this week with much less nonviable tissue. 12/11/2022: The Mohs defect continues to contract. There is good granulation tissue underneath a layer of dried up Iodoflex and some slough. 12/21/2022: The wound is smaller again this week, as well as shallower. There is a little bit of slough on the surface. 12/29/2022: His right leg has become massively swollen and he has had skin breakdown occur as a result with a lot of serous drainage. He has not been particularly compliant with compression garment wear, nor has he been elevating his legs. The wound on his left leg is smaller and edema control on the side is very good. 01/07/2023: He has built up some slough and eschar on the right leg, but edema control has improved markedly. The wound on his left leg is much smaller again today with just a little slough on the surface. 01/12/2023: Both legs have improved. There is less open area on the right leg, but there is still slough and eschar accumulation. The left leg wound is smaller again today, but still has slough buildup. 01/19/2023: Both legs continue to improve. The drainage from the right leg was malodorous today and is also blue-green in color. The left leg wound is considerably smaller, but continues to accumulate slough. 01/26/2023: The left  leg wound is almost closed underneath the layer of eschar. The right leg wound is no longer  malodorous. There is still some crusted up drainage on the surface but much of the open area has epithelialized. Edema control is markedly improved. Patient History Information obtained from Patient. Family History Cancer - Father, Diabetes - Maternal Grandparents, Heart Disease - Maternal Grandparents, Hypertension - Maternal Grandparents, No family history of Hereditary Spherocytosis, Kidney Disease, Lung Disease, Seizures, Stroke, Thyroid Problems, Tuberculosis. Social History Former smoker - Quit over 30 years ago, Marital Status - Married, Alcohol Use - Rarely, Drug Use - No History, Caffeine Use - Daily. Medical History Eyes Patient has history of Glaucoma Cardiovascular Patient has history of Arrhythmia - afib, Coronary Artery Disease, Hypertension, Peripheral Venous Disease Endocrine Denies history of Type I Diabetes, Type II Diabetes Genitourinary Denies history of End Stage Renal Disease Integumentary (Skin) Denies history of History of Burn Musculoskeletal Patient has history of Osteoarthritis Oncologic Denies history of Received Chemotherapy, Received Radiation Psychiatric Denies history of Anorexia/bulimia, Confinement Anxiety Hospitalization/Surgery History - lumbar laminectomy. - total knee arthropathy right. - bil total hip arthroplasty. - coronary stents. - toe surgery. - tonsillectomy. CASTEN, GREGORICH ([id number]) [id number] Page 8 of 12 - shunt insertion. Medical A Surgical History Notes nd Gastrointestinal colon polyps Endocrine Hypothyroidism Genitourinary BPH Musculoskeletal Bilat Hip Replacements, Right Knee Replacement, lumbar stenosis Neurologic hydrocephalus Oncologic Skin Cancer multiple sites Objective Constitutional Slightly hypertensive. no acute distress. Vitals Time Taken: 12:48 PM, Height: 69 in, Weight: 198 lbs, BMI: 29.2, Temperature: 98 F, Pulse: 68 bpm, Respiratory Rate: 20 breaths/min, Blood  Pressure: 146/75 mmHg. Respiratory Normal work of breathing on room air. General Notes: 01/26/2023: The left leg wound is almost closed underneath the layer of eschar. The right leg wound is no longer malodorous. There is still some crusted up drainage on the surface but much of the open area has epithelialized. Edema control is markedly improved. Integumentary (Hair, Skin) Wound #3 status is Open. Original cause of wound was Puncture. The date acquired was: 08/12/2022. The wound has been in treatment 22 weeks. The wound is located on the Left,Posterior Lower Leg. The wound measures 0.3cm length x 0.3cm width x 0.1cm depth; 0.071cm^2 area and 0.007cm^3 volume. There is Fat Layer (Subcutaneous Tissue) exposed. There is no tunneling or undermining noted. There is a medium amount of serosanguineous drainage noted. The wound margin is distinct with the outline attached to the wound base. There is small (1-33%) red granulation within the wound bed. There is a large (67-100%) amount of necrotic tissue within the wound bed including Eschar. The periwound skin appearance had no abnormalities noted for texture. The periwound skin appearance had no abnormalities noted for moisture. The periwound skin appearance exhibited: Hemosiderin Staining. Periwound temperature was noted as No Abnormality. Wound #5 status is Open. Original cause of wound was Gradually Appeared. The date acquired was: 12/29/2022. The wound has been in treatment 4 weeks. The wound is located on the Right,Medial Lower Leg. The wound measures 0.5cm length x 0.5cm width x 0.1cm depth; 0.196cm^2 area and 0.02cm^3 volume. There is Fat Layer (Subcutaneous Tissue) exposed. There is no tunneling or undermining noted. There is a medium amount of serous drainage noted. The wound margin is distinct with the outline attached to the wound base. There is small (1-33%) red, pink granulation within the wound bed. There is a large (67-100%) amount of necrotic  tissue within the wound bed including Eschar and Adherent Slough. The periwound skin appearance had no  abnormalities noted for texture. The periwound skin appearance exhibited: Dry/Scaly, Hemosiderin Staining. Periwound temperature was noted as No Abnormality. 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 There was a Selective/Open Wound Non-Viable Tissue Debridement with a total area of 0.07 sq cm performed by Duanne Guess, MD. With the following instrument(s): Curette to remove Non-Viable tissue/material. Material removed includes Eschar after achieving pain control using Lidocaine 4% T opical Solution. No specimens were taken. A time out was conducted at 13:08, prior to the start of the procedure. A Minimum amount of bleeding was controlled with Pressure. The procedure was tolerated well. Post Debridement Measurements: 0.3cm length x 0.3cm width x 0.1cm depth; 0.007cm^3 volume. Character of Wound/Ulcer Post Debridement is improved. Post procedure Diagnosis Wound #3: Same as Pre-Procedure General Notes: Scribed for Dr Lady Gary by Samuella Bruin, RN. GADDIS, KOSER ([id number]) [phone].pdf Page 9 of 12 Pre-procedure diagnosis of Wound #3 is a Lesion located on the Left,Posterior Lower Leg . There was a Double Layer Compression Therapy Procedure by Samuella Bruin, RN. Post procedure Diagnosis Wound #3: Same as Pre-Procedure Wound #5 Pre-procedure diagnosis of Wound #5 is a Lymphedema located on the Right,Medial Lower Leg . There was a Selective/Open Wound Non-Viable  Tissue Debridement with a total area of 11.78 sq cm performed by Duanne Guess, MD. With the following instrument(s): Curette to remove Non-Viable tissue/material. Material removed includes Eschar after achieving pain control using Lidocaine 4% Topical Solution. No specimens were taken. A time out was conducted at 13:08, prior to the start of the procedure. A Minimum amount of bleeding was controlled with Pressure. The procedure was tolerated well. Post Debridement Measurements: 5cm length x 3cm width x 0.1cm depth; 1.178cm^3 volume. Character of Wound/Ulcer Post Debridement is improved. Post procedure Diagnosis Wound #5: Same as Pre-Procedure General Notes: Scribed for Dr Lady Gary by Samuella Bruin, RN. Pre-procedure diagnosis of Wound #5 is a Lymphedema located on the Right,Medial Lower Leg . There was a Double Layer Compression Therapy Procedure by Samuella Bruin, RN. Post procedure Diagnosis Wound #5: Same as Pre-Procedure Plan Follow-up Appointments: Return Appointment in 1 week. - Dr. Lady Gary - Room 2 Anesthetic: (In clinic) Topical Lidocaine 4% applied to wound bed Bathing/ Shower/ Hygiene: May shower with protection but do not get wound dressing(s) wet. Protect dressing(s) with water repellant cover (for example, large plastic bag) or a cast cover and may then take shower. Edema Control - Lymphedema / SCD / Other: Elevate legs to the level of the heart or above for 30 minutes daily and/or when sitting for 3-4 times a day throughout the day. - Elevate legs throughout the day. When not walking, elevate legs. Avoid standing for long periods of time. Exercise regularly - including ankle circles while sitting Moisturize legs daily. - both legs nightly after removing juxtalites Compression stocking or Garment 20-30 mm/Hg pressure to: - both legs daily The following medication(s) was prescribed: lidocaine topical 4 % cream cream topical was prescribed at facility WOUND #3: - Lower  Leg Wound Laterality: Left, Posterior Cleanser: Soap and Water 1 x Per Day/30 Days Discharge Instructions: May shower and wash wound with dial antibacterial soap and water prior to dressing change. Cleanser: Wound Cleanser 1  x Per Day/30 Days Discharge Instructions: Cleanse the wound with wound cleanser prior to applying a clean dressing using gauze sponges, not tissue or cotton balls. Peri-Wound Care: Sween Lotion (Moisturizing lotion) 1 x Per Day/30 Days Discharge Instructions: Apply moisturizing lotion as directed Prim Dressing: Promogran Prisma Matrix, 4.34 (sq in) (silver collagen) 1 x Per Day/30 Days ary Discharge Instructions: Moisten collagen with saline or hydrogel Secondary Dressing: ABD Pad, 5x9 1 x Per Day/30 Days Discharge Instructions: Apply over primary dressing as directed. Secondary Dressing: Woven Gauze Sponge, Non-Sterile 4x4 in 1 x Per Day/30 Days Discharge Instructions: Apply over primary dressing as directed. Com pression Wrap: Urgo K2 Lite, two layer compression system, regular 1 x Per Day/30 Days Discharge Instructions: Apply Urgo K2 Lite as directed (alternative to 3 layer compression). WOUND #5: - Lower Leg Wound Laterality: Right, Medial Cleanser: Soap and Water 1 x Per Day/30 Days Discharge Instructions: May shower and wash wound with dial antibacterial soap and water prior to dressing change. Cleanser: Wound Cleanser 1 x Per Day/30 Days Discharge Instructions: Cleanse the wound with wound cleanser prior to applying a clean dressing using gauze sponges, not tissue or cotton balls. Peri-Wound Care: Sween Lotion (Moisturizing lotion) 1 x Per Day/30 Days Discharge Instructions: Apply moisturizing lotion as directed Topical: Gentamicin 1 x Per Day/30 Days Discharge Instructions: As directed by physician Prim Dressing: Maxorb Extra Ag+ Alginate Dressing, 4x4.75 (in/in) 1 x Per Day/30 Days ary Discharge Instructions: Apply to wound bed as instructed Secondary  Dressing: ABD Pad, 5x9 1 x Per Day/30 Days Discharge Instructions: Apply over primary dressing as directed. Secondary Dressing: Woven Gauze Sponge, Non-Sterile 4x4 in 1 x Per Day/30 Days Discharge Instructions: Apply over primary dressing as directed. Com pression Wrap: Urgo K2 Lite, two layer compression system, regular 1 x Per Day/30 Days Discharge Instructions: Apply Urgo K2 Lite as directed (alternative to 3 layer compression). 01/26/2023: The left leg wound is almost closed underneath the layer of eschar. The right leg wound is no longer malodorous. There is still some crusted up drainage on the surface but much of the open area has epithelialized. Edema control is markedly improved. I used a curette to debride eschar off of the wounds on both legs. We will continue Prisma silver collagen on the left and topical gentamicin with silver alginate on the right. Continue bilateral 3 layer compression equivalent. I anticipate he will likely be closed in the next 1 to 2 weeks. Follow-up in 1 week's time. Electronic Signature(s) Signed: 01/26/2023 2:10:43 PM By: Duanne Guess MD FACS Tilden, Thurmon Fair ([id number]) [id number] Page 10 of 12 Entered By: Duanne Guess on 01/26/2023 14:10:42 -------------------------------------------------------------------------------- HxROS Details Patient Name: Date of Service: [name], [name] 01/26/2023 12:45 PM Medical Record Number: [id number] Patient Account Number: [id number] Date of Birth/Sex: Treating RN: 1936-07-18 (87 y.o. M) Primary Care Provider: Willow Ora Other Clinician: Referring Provider: Treating Provider/Extender: Cresenciano Genre in Treatment: 24 Information Obtained From Patient Eyes Medical History: Positive for: Glaucoma Cardiovascular Medical History: Positive for: Arrhythmia - afib; Coronary Artery Disease; Hypertension; Peripheral Venous Disease Gastrointestinal Medical History: Past  Medical History Notes: colon polyps Endocrine Medical History: Negative for: Type I Diabetes; Type II Diabetes Past Medical History Notes: Hypothyroidism Genitourinary Medical History: Negative for: End Stage Renal Disease Past Medical History Notes: BPH Integumentary (Skin) Medical History: Negative for: History of Burn Musculoskeletal Medical History: Positive for: Osteoarthritis Past Medical History Notes: Bilat Hip Replacements, Right Knee Replacement, lumbar stenosis Neurologic Medical History: Past  Medical History Notes: hydrocephalus Oncologic Medical History: Negative for: Received Chemotherapy; Received Radiation Past Medical History Notes: Skin Cancer multiple sites Psychiatric Medical History: Negative for: Anorexia/bulimia; Confinement Anxiety HBO Extended History Items NIVED, SIMONICH ([id number]) [id number] Page 11 of 12 Eyes: Glaucoma Immunizations Pneumococcal Vaccine: Received Pneumococcal Vaccination: Yes Received Pneumococcal Vaccination On or After 60th Birthday: Yes Implantable Devices None Hospitalization / Surgery History Type of Hospitalization/Surgery lumbar laminectomy total knee arthropathy right bil total hip arthroplasty coronary stents toe surgery tonsillectomy shunt insertion Family and Social History Cancer: Yes - Father; Diabetes: Yes - Maternal Grandparents; Heart Disease: Yes - Maternal Grandparents; Hereditary Spherocytosis: No; Hypertension: Yes - Maternal Grandparents; Kidney Disease: No; Lung Disease: No; Seizures: No; Stroke: No; Thyroid Problems: No; Tuberculosis: No; Former smoker - Quit over 30 years ago; Marital Status - Married; Alcohol Use: Rarely; Drug Use: No History; Caffeine Use: Daily; Financial Concerns: No; Food, Clothing or Shelter Needs: No; Support System Lacking: No; Transportation Concerns: No Psychologist, prison and probation services) Signed: 01/26/2023 3:59:13 PM By: Duanne Guess MD  FACS Entered By: Duanne Guess on 01/26/2023 14:07:34 -------------------------------------------------------------------------------- SuperBill Details Patient Name: Date of Service: [name], [name] 01/26/2023 Medical Record Number: [id number] Patient Account Number: [id number] Date of Birth/Sex: Treating RN: 03-03-1936 (87 y.o. M) Primary Care Provider: Willow Ora Other Clinician: Referring Provider: Treating Provider/Extender: Elsie Saas Weeks in Treatment: 24 Diagnosis Coding ICD-10 Codes Code Description [phone] Non-pressure chronic ulcer of left calf with fat layer exposed L97.811 Non-pressure chronic ulcer of other part of right lower leg limited to breakdown of skin L98.9 Disorder of the skin and subcutaneous tissue, unspecified I87.2 Venous insufficiency (chronic) (peripheral) I10 Essential (primary) hypertension Facility Procedures : CPT4 Code: [id number] Description: [phone] - DEBRIDE WOUND 1ST 20 SQ CM OR < ICD-10 Diagnosis Description L97.222 Non-pressure chronic ulcer of left calf with fat layer exposed L97.811 Non-pressure chronic ulcer of other part of right lower leg limited to breakdown Modifier: of skin Quantity: 1 Physician Procedures : CPT4 Code Description Modifier [id number] - WC PHYS LEVEL 4 - EST PT 25 ICD-10 Diagnosis Description L97.222 Non-pressure chronic ulcer of left calf with fat layer exposed L97.811 Non-pressure chronic ulcer of other part of right lower leg limited  to breakdown of skin I87.2 Venous insufficiency (chronic) (peripheral) L98.9 Disorder of the skin and subcutaneous tissue, unspecified Quantity: 1 : [id number] - WC PHYS DEBR WO ANESTH 20 SQ CM ABELARDO, HARTSOCK E ([id number]) [id number]. Quantity: 1 pdf Page 12 of 12 : ICD-10 Diagnosis Description L97.222 Non-pressure chronic ulcer of left calf with fat layer exposed L97.811 Non-pressure chronic ulcer of other part of right lower leg limited to  breakdown of skin Quantity: Electronic Signature(s) Signed: 01/26/2023 2:12:40 PM By: Duanne Guess MD FACS Entered By: Duanne Guess on 01/26/2023 14:12:40

## 2023-02-01 ENCOUNTER — Other Ambulatory Visit: Payer: Self-pay | Admitting: Internal Medicine

## 2023-02-02 ENCOUNTER — Encounter (HOSPITAL_BASED_OUTPATIENT_CLINIC_OR_DEPARTMENT_OTHER): Payer: Medicare Other | Admitting: General Surgery

## 2023-02-02 DIAGNOSIS — I480 Paroxysmal atrial fibrillation: Secondary | ICD-10-CM | POA: Diagnosis not present

## 2023-02-02 DIAGNOSIS — L97812 Non-pressure chronic ulcer of other part of right lower leg with fat layer exposed: Secondary | ICD-10-CM | POA: Diagnosis not present

## 2023-02-02 DIAGNOSIS — L97222 Non-pressure chronic ulcer of left calf with fat layer exposed: Secondary | ICD-10-CM | POA: Diagnosis not present

## 2023-02-02 DIAGNOSIS — L989 Disorder of the skin and subcutaneous tissue, unspecified: Secondary | ICD-10-CM | POA: Diagnosis not present

## 2023-02-02 DIAGNOSIS — Z7901 Long term (current) use of anticoagulants: Secondary | ICD-10-CM | POA: Diagnosis not present

## 2023-02-02 DIAGNOSIS — Z96643 Presence of artificial hip joint, bilateral: Secondary | ICD-10-CM | POA: Diagnosis not present

## 2023-02-02 DIAGNOSIS — I872 Venous insufficiency (chronic) (peripheral): Secondary | ICD-10-CM | POA: Diagnosis not present

## 2023-02-02 DIAGNOSIS — I10 Essential (primary) hypertension: Secondary | ICD-10-CM | POA: Diagnosis not present

## 2023-02-02 DIAGNOSIS — L988 Other specified disorders of the skin and subcutaneous tissue: Secondary | ICD-10-CM | POA: Diagnosis not present

## 2023-02-02 DIAGNOSIS — Z8249 Family history of ischemic heart disease and other diseases of the circulatory system: Secondary | ICD-10-CM | POA: Diagnosis not present

## 2023-02-02 DIAGNOSIS — I2510 Atherosclerotic heart disease of native coronary artery without angina pectoris: Secondary | ICD-10-CM | POA: Diagnosis not present

## 2023-02-02 DIAGNOSIS — Z87891 Personal history of nicotine dependence: Secondary | ICD-10-CM | POA: Diagnosis not present

## 2023-02-02 DIAGNOSIS — Z955 Presence of coronary angioplasty implant and graft: Secondary | ICD-10-CM | POA: Diagnosis not present

## 2023-02-03 ENCOUNTER — Ambulatory Visit (HOSPITAL_COMMUNITY)
Admission: RE | Admit: 2023-02-03 | Discharge: 2023-02-03 | Disposition: A | Discharge status: Home / Self Care | Payer: Medicare Other | Source: Ambulatory Visit | Attending: Cardiovascular Disease | Admitting: Cardiovascular Disease

## 2023-02-03 DIAGNOSIS — I779 Disorder of arteries and arterioles, unspecified: Secondary | ICD-10-CM | POA: Insufficient documentation

## 2023-02-10 ENCOUNTER — Encounter (HOSPITAL_BASED_OUTPATIENT_CLINIC_OR_DEPARTMENT_OTHER): Payer: Medicare Other | Admitting: General Surgery

## 2023-02-10 DIAGNOSIS — L989 Disorder of the skin and subcutaneous tissue, unspecified: Secondary | ICD-10-CM | POA: Diagnosis not present

## 2023-02-10 DIAGNOSIS — I10 Essential (primary) hypertension: Secondary | ICD-10-CM | POA: Diagnosis not present

## 2023-02-10 DIAGNOSIS — I2510 Atherosclerotic heart disease of native coronary artery without angina pectoris: Secondary | ICD-10-CM | POA: Diagnosis not present

## 2023-02-10 DIAGNOSIS — Z96643 Presence of artificial hip joint, bilateral: Secondary | ICD-10-CM | POA: Diagnosis not present

## 2023-02-10 DIAGNOSIS — Z7901 Long term (current) use of anticoagulants: Secondary | ICD-10-CM | POA: Diagnosis not present

## 2023-02-10 DIAGNOSIS — L97222 Non-pressure chronic ulcer of left calf with fat layer exposed: Secondary | ICD-10-CM | POA: Diagnosis not present

## 2023-02-10 DIAGNOSIS — Z955 Presence of coronary angioplasty implant and graft: Secondary | ICD-10-CM | POA: Diagnosis not present

## 2023-02-10 DIAGNOSIS — Z8249 Family history of ischemic heart disease and other diseases of the circulatory system: Secondary | ICD-10-CM | POA: Diagnosis not present

## 2023-02-10 DIAGNOSIS — I872 Venous insufficiency (chronic) (peripheral): Secondary | ICD-10-CM | POA: Diagnosis not present

## 2023-02-10 DIAGNOSIS — L97812 Non-pressure chronic ulcer of other part of right lower leg with fat layer exposed: Secondary | ICD-10-CM | POA: Diagnosis not present

## 2023-02-10 DIAGNOSIS — Z87891 Personal history of nicotine dependence: Secondary | ICD-10-CM | POA: Diagnosis not present

## 2023-02-10 DIAGNOSIS — I480 Paroxysmal atrial fibrillation: Secondary | ICD-10-CM | POA: Diagnosis not present

## 2023-03-23 ENCOUNTER — Other Ambulatory Visit: Payer: Self-pay | Admitting: Internal Medicine

## 2023-03-23 ENCOUNTER — Other Ambulatory Visit: Payer: Self-pay | Admitting: Cardiovascular Disease

## 2023-03-23 DIAGNOSIS — H2513 Age-related nuclear cataract, bilateral: Secondary | ICD-10-CM | POA: Diagnosis not present

## 2023-03-23 DIAGNOSIS — H401134 Primary open-angle glaucoma, bilateral, indeterminate stage: Secondary | ICD-10-CM | POA: Diagnosis not present

## 2023-03-23 DIAGNOSIS — I482 Chronic atrial fibrillation, unspecified: Secondary | ICD-10-CM

## 2023-03-23 NOTE — Telephone Encounter (Signed)
Prescription refill request for Eliquis received. Indication:afib Last office visit:3/24 Scr:1.05  4/24 Age: 87 Weight:90.5  kg  Prescription refilled

## 2023-04-15 ENCOUNTER — Encounter: Payer: Self-pay | Admitting: Internal Medicine

## 2023-04-21 DIAGNOSIS — M75121 Complete rotator cuff tear or rupture of right shoulder, not specified as traumatic: Secondary | ICD-10-CM | POA: Diagnosis not present

## 2023-04-22 ENCOUNTER — Other Ambulatory Visit: Payer: Self-pay | Admitting: Internal Medicine

## 2023-05-01 ENCOUNTER — Other Ambulatory Visit: Payer: Self-pay | Admitting: Internal Medicine

## 2023-05-10 ENCOUNTER — Ambulatory Visit (INDEPENDENT_AMBULATORY_CARE_PROVIDER_SITE_OTHER): Payer: Medicare Other | Admitting: Podiatry

## 2023-05-10 DIAGNOSIS — M79675 Pain in left toe(s): Secondary | ICD-10-CM

## 2023-05-10 DIAGNOSIS — M79674 Pain in right toe(s): Secondary | ICD-10-CM | POA: Diagnosis not present

## 2023-05-10 DIAGNOSIS — B351 Tinea unguium: Secondary | ICD-10-CM | POA: Diagnosis not present

## 2023-05-10 NOTE — Progress Notes (Unsigned)
       Subjective:  Patient ID: [name], male    DOB: [birth year],  MRN: [id number]   [name] presents to clinic today for:  Chief Complaint  Patient presents with   Nail Problem    RFC-  no new concerns today Primary care doctor is Dr. Drue [name]  . Patient notes nails are thick, discolored, elongated and painful in shoegear when trying to ambulate.    PCP is [name] Plump, MD.  Past Medical History:  Diagnosis Date   A-fib [facility])    Arthritis    BPH (benign prostatic hypertrophy)    Carotid artery disease (HCC)    Doppler, December 08, 2011, 00 39% R. ICA, 40-59% LICA, followup 1 year   Coronary artery disease    DES Circumflex or CVA 2006  /  clear, February, 2011, EF 70%, no ischemia or   Ejection fraction    EF 60%, echo, 2009, mildly calcified aortic leaflets   History of colonic polyps    Hyperlipidemia    Hypertension    Hypothyroidism    Lumbar radiculopathy    Multiple thyroid nodules    Avascular echogenic areas noted in the right thyroid at the time of carotid Doppler.    PONV (postoperative nausea and vomiting)    "nausea with first hip surgery"   Skin cancer (melanoma) (HCC)    PMH of    Spinal stenosis, lumbar    Tremor    Fine tremor right upper extremity   Venous insufficiency     Allergies  Allergen Reactions   Norvasc [Amlodipine Besylate] Swelling    Review of Systems: Negative except as noted in the HPI.  Objective:  There were no vitals filed for this visit.  [name] is a pleasant 87 y.o. male in NAD. AAO x 3.  Vascular Examination: Capillary refill time is 3-5 seconds to toes bilateral. Palpable pedal pulses b/l LE. Digital hair present b/l.  Skin temperature gradient WNL b/l. No varicosities b/l. No cyanosis noted b/l.   Dermatological Examination: Pedal skin with normal turgor, texture and tone b/l. No open wounds. No interdigital macerations b/l. Toenails x10 are 3mm thick, discolored, dystrophic with subungual debris. There  is pain with compression of the nail plates.  They are elongated x10     Latest Ref Rng & Units 01/15/2023   10:58 AM 10/09/2022   11:48 AM  Hemoglobin A1C  Hemoglobin-A1c 4.6 - 6.5 % 6.6  6.3    Assessment/Plan: 1. Pain due to onychomycosis of toenails of both feet     The mycotic toenails were sharply debrided x10 with sterile nail nippers and a power debriding burr to decrease bulk/thickness and length.    Return in about 3 months (around 08/10/2023) for RFC.   [name], DPM, FACFAS [facility]     [address] [place], [place] [address]                Office [phone]  Fax [phone]

## 2023-06-05 ENCOUNTER — Other Ambulatory Visit: Payer: Self-pay | Admitting: Cardiovascular Disease

## 2023-06-05 DIAGNOSIS — R601 Generalized edema: Secondary | ICD-10-CM

## 2023-06-08 DIAGNOSIS — G910 Communicating hydrocephalus: Secondary | ICD-10-CM | POA: Diagnosis not present

## 2023-06-10 ENCOUNTER — Other Ambulatory Visit (INDEPENDENT_AMBULATORY_CARE_PROVIDER_SITE_OTHER): Payer: Medicare Other

## 2023-06-10 DIAGNOSIS — E039 Hypothyroidism, unspecified: Secondary | ICD-10-CM

## 2023-06-10 LAB — TSH: TSH: 1.79 u[IU]/mL (ref 0.35–5.50)

## 2023-06-15 ENCOUNTER — Ambulatory Visit: Payer: Medicare Other | Admitting: Internal Medicine

## 2023-07-01 ENCOUNTER — Ambulatory Visit: Payer: Medicare Other | Admitting: Internal Medicine

## 2023-07-01 VITALS — BP 136/80 | HR 75 | Temp 97.4°F | Resp 18 | Ht 68.0 in | Wt 200.2 lb

## 2023-07-01 DIAGNOSIS — F0390 Unspecified dementia without behavioral disturbance: Secondary | ICD-10-CM | POA: Insufficient documentation

## 2023-07-01 DIAGNOSIS — E782 Mixed hyperlipidemia: Secondary | ICD-10-CM

## 2023-07-01 DIAGNOSIS — Z23 Encounter for immunization: Secondary | ICD-10-CM

## 2023-07-01 DIAGNOSIS — I10 Essential (primary) hypertension: Secondary | ICD-10-CM

## 2023-07-01 DIAGNOSIS — R739 Hyperglycemia, unspecified: Secondary | ICD-10-CM | POA: Diagnosis not present

## 2023-07-01 DIAGNOSIS — G3184 Mild cognitive impairment, so stated: Secondary | ICD-10-CM

## 2023-07-01 LAB — BASIC METABOLIC PANEL
BUN: 21 mg/dL (ref 6–23)
CO2: 28 meq/L (ref 19–32)
Calcium: 9.9 mg/dL (ref 8.4–10.5)
Chloride: 104 meq/L (ref 96–112)
Creatinine, Ser: 1.03 mg/dL (ref 0.40–1.50)
GFR: 66.34 mL/min (ref >60.00)
Glucose, Bld: 74 mg/dL (ref 70–99)
Potassium: 4.3 meq/L (ref 3.5–5.1)
Sodium: 140 meq/L (ref 135–145)

## 2023-07-01 LAB — LIPID PANEL
Cholesterol: 129 mg/dL (ref 0–200)
HDL: 40.2 mg/dL (ref >39.00)
LDL Cholesterol: 70 mg/dL (ref 0–99)
NonHDL: 88.81
Total CHOL/HDL Ratio: 3
Triglycerides: 93 mg/dL (ref 0.0–149.0)
VLDL: 18.6 mg/dL (ref 0.0–40.0)

## 2023-07-01 LAB — CBC WITH DIFFERENTIAL/PLATELET
Basophils Absolute: 0.1 10*3/uL (ref 0.0–0.1)
Basophils Relative: 1.4 % (ref 0.0–3.0)
Eosinophils Absolute: 0.4 10*3/uL (ref 0.0–0.7)
Eosinophils Relative: 5.8 % — ABNORMAL HIGH (ref 0.0–5.0)
HCT: 50.9 % (ref 39.0–52.0)
Hemoglobin: 16.5 g/dL (ref 13.0–17.0)
Lymphocytes Relative: 17.1 % (ref 12.0–46.0)
Lymphs Abs: 1.3 10*3/uL (ref 0.7–4.0)
MCHC: 32.4 g/dL (ref 30.0–36.0)
MCV: 92.9 fL (ref 78.0–100.0)
Monocytes Absolute: 0.9 10*3/uL (ref 0.1–1.0)
Monocytes Relative: 12.4 % — ABNORMAL HIGH (ref 3.0–12.0)
Neutro Abs: 4.8 10*3/uL (ref 1.4–7.7)
Neutrophils Relative %: 63.3 % (ref 43.0–77.0)
Platelets: 218 10*3/uL (ref 150.0–400.0)
RBC: 5.47 Mil/uL (ref 4.22–5.81)
RDW: 16.1 % — ABNORMAL HIGH (ref 11.5–15.5)
WBC: 7.5 10*3/uL (ref 4.0–10.5)

## 2023-07-01 LAB — B12 AND FOLATE PANEL
Folate: 17.4 ng/mL (ref >5.9)
Vitamin B-12: 432 pg/mL (ref 211–911)

## 2023-07-01 LAB — VITAMIN D 25 HYDROXY (VIT D DEFICIENCY, FRACTURES): VITD: 27.73 ng/mL — ABNORMAL LOW (ref 30.00–100.00)

## 2023-07-01 NOTE — Assessment & Plan Note (Signed)
[facility] Patient wife reports memory issues, gradually for the last year. MMSE today: 22, mild to moderate memory loss. This was discussed with the patient, he was aware of the issue. He does not drive anymore.  He is able to dress himself, most of the home chores are done by the wife. Medication such as Aricept discussed, they declined for now.  Encouraged brain activity such as Sudoku and reading. Plan: Check RPR, B12, folic acid, vitamin D Last brain imaging about a year ago.   See next Normal pressure hydrocephalus: Status post ventriculoperitoneal shunt 06-2022, wife plans to reach out to neurosurgery to be sure that is working well on these not playing a role on the memory decline. Hyperglycemia: Check A1c. HTN: BP today is very good, on Lotensin, diltiazem, Lasix, metoprolol.  Check a BMP and CBC. Hypothyroidism: Last TSH 1.7.  No change. CAD: No angina at this point. Flu shot today, vaccine advice provided RTC 3 months

## 2023-07-01 NOTE — Patient Instructions (Addendum)
Vaccines I recommend: Tdap (tetanus) RSV vaccine   Check the  blood pressure regularly Blood pressure goal:  between 110/65 and  135/85. If it is consistently higher or lower, let me know     GO TO THE LAB : Get the blood work     Next visit with me in 3 months for a routine checkup     Please schedule it at the front desk

## 2023-07-01 NOTE — Progress Notes (Signed)
Subjective:    Patient ID: [name], male    DOB: August 05, 1936, 87 y.o.   MRN: [id number]  DOS:  07/01/2023 Type of visit - description: Routine checkup  Routine checkup, chronic medical problems addressed. Also the wife expressed some concern about the patient's memory, gradual issue over the last year.  The patient reports no recent falls. No headache.  No chest pain or difficulty breathing.  During the visit, he went to the restroom twice.  The patient's wife reports that he urinates frequently and she thinks is from the medications. No recent fever or chills, no dysuria or gross hematuria.   Review of Systems See above   Past Medical History:  Diagnosis Date   A-fib [facility])    Arthritis    BPH (benign prostatic hypertrophy)    Carotid artery disease (HCC)    Doppler, December 08, 2011, 00 39% R. ICA, 40-59% LICA, followup 1 year   Coronary artery disease    DES Circumflex or CVA 2006  /  clear, February, 2011, EF 70%, no ischemia or   Ejection fraction    EF 60%, echo, 2009, mildly calcified aortic leaflets   History of colonic polyps    Hyperlipidemia    Hypertension    Hypothyroidism    Lumbar radiculopathy    Multiple thyroid nodules    Avascular echogenic areas noted in the right thyroid at the time of carotid Doppler.    PONV (postoperative nausea and vomiting)    "nausea with first hip surgery"   Skin cancer (melanoma) (HCC)    PMH of    Spinal stenosis, lumbar    Tremor    Fine tremor right upper extremity   Venous insufficiency     Past Surgical History:  Procedure Laterality Date   COLONOSCOPY     CORONARY ANGIOPLASTY WITH STENT PLACEMENT  2006   x 2 stents; DES to CX and dRCA '06   KNEE SURGERY  1970's   "chipped bone"   LUMBAR LAMINECTOMY/DECOMPRESSION MICRODISCECTOMY Left 07/13/2016   Procedure: LUMBAR LAMINECTOMY AND FORAMINOTOMY Lumbar two, three, Lumbar three-four, Lumbar four-five, LEFT Lumbar five-Sacral one DISECTOMY;  Surgeon: Tressie Stalker, MD;  [facility]: [facility];  Service: Neurosurgery;  Laterality: Left;   TOE SURGERY  1997   TONSILLECTOMY     TOTAL HIP ARTHROPLASTY Left 2007   TOTAL HIP ARTHROPLASTY Right 2010   TOTAL KNEE ARTHROPLASTY Right 06/25/2014   Procedure: RIGHT TOTAL KNEE ARTHROPLASTY;  Surgeon: Loanne Drilling, MD;  Location: [facility] ORS;  Service: Orthopedics;  Laterality: Right;   VENTRICULOPERITONEAL SHUNT Right 07/09/2022   Procedure: SHUNT INSERTION VENTRICULAR-PERITONEAL;  Surgeon: Tressie Stalker, MD;  Location: [facility] OR;  Service: Neurosurgery;  Laterality: Right;    Current Outpatient Medications  Medication Instructions   ascorbic acid (VITAMIN C) 500 mg, Oral, Daily   aspirin 81 mg, Oral, Daily   atorvastatin (LIPITOR) 60 mg, Oral, Daily at bedtime   benazepril (LOTENSIN) 30 mg, Oral, Daily   diltiazem (DILT-XR) 180 mg, Oral, Daily   docusate sodium (COLACE) 100 mg, Oral, 2 times daily   ELIQUIS 5 MG TABS tablet TAKE 1 TABLET BY MOUTH TWICE DAILY   furosemide (LASIX) 40 MG tablet TAKE 1 AND 1/2 TABLETS(60 MG) BY MOUTH TWICE DAILY   levothyroxine (SYNTHROID) 200 mcg, Oral, Daily before breakfast   LUMIGAN 0.01 % SOLN 1 drop, Both Eyes, Daily at bedtime   metoprolol succinate (TOPROL-XL) 25 MG 24 hr tablet 1/2 tablet a day as needed for pulse >  80   nitroGLYCERIN (NITROSTAT) 0.4 MG SL tablet PLACE 1 TABLET UNDER THE TONGUE EVERY 5 MINUTES AS NEEDED FOR CHEST PAIN   Timolol Maleate 0.5 % (DAILY) SOLN 1 drop, Ophthalmic, 2 times daily, At breakfast and evening meals       Objective:   Physical Exam BP 136/80 (BP Location: Left Arm, Patient Position: Sitting, Cuff Size: Large)   Pulse 75   Temp (!) 97.4 F (36.3 C) (Oral)   Resp 18   Ht 5\' 8"  (1.727 m)   Wt 200 lb 3.2 oz (90.8 kg)   SpO2 96%   BMI 30.44 kg/m  General:   Well developed, NAD, BMI noted. HEENT:  Normocephalic . Face symmetric, atraumatic  al edema bilaterally  Skin: Not pale. Not jaundice Neurologic:  alert, follow  commands MSE: 22.  Oriented to space, not oriented to time. Speech normal, gait appropriate for age, assisted by a cane.   Psych--   Behavior appropriate. No anxious or depressed appearing.      Assessment   Assessment  (Transfer from  Dr. Alwyn Ren [phone]) Hyperglycemia, A1c 6.2 2013 HTN Hyperlipidemia Hypothyroidism  Thyroid US 2014-- no nodules, inhomogeneous  CV: --CAD: stents 2006;  low risk nuclear scan 2011, 03-2016 --A Fib dx 09/2018 at regular cards OV, on Eliquis  --Carotid artery disease [place] 12/2014:   Stable, 1-39% RICA stenosis. Stable, 40-59% LICA stenosis. [place] 12/2015  [place] 04/2020: 0-39% B 01/2023: 1 to 39%.  Stable,rec  no further routine ultrasounds R leg edema, [place] (-)   DVT 08-2014 and 01/2019 [facility] DJD: Dr Despina Hick, back surgery Dr Lovell Sheehan 06-2016 Venous insufficiency, chronic R>L edema Tremor Glaucoma Normal pressure hydrocephalus (ventriculoperitoneal shunt insertion October 2023, gait improved) H/o skin cancer , denies Melanoma; sees derm, had a visit ~ 09/2017, Dr Debarah Crape [facility] Patient wife reports memory issues, gradually for the last year. MMSE today: 22, mild to moderate memory loss. This was discussed with the patient, he was aware of the issue. He does not drive anymore.  He is able to dress himself, most of the home chores are done by the wife. Medication such as Aricept discussed, they declined for now.  Encouraged brain activity such as Sudoku and reading. Plan: Check RPR, B12, folic acid, vitamin D Last brain imaging about a year ago.   See next Normal pressure hydrocephalus: Status post ventriculoperitoneal shunt 06-2022, wife plans to reach out to neurosurgery to be sure that is working well on these not playing a role on the memory decline. Hyperglycemia: Check A1c. HTN: BP today is very good, on Lotensin, diltiazem, Lasix, metoprolol.  Check a BMP and CBC. Hypothyroidism: Last TSH 1.7.  No change. CAD: No angina at this point. Flu shot today,  vaccine advice provided RTC 3 months  Time spent 40 minutes, mostly spent on a detail mental exam  - MMSE, discussing the results, explaining patient and his wife treatment options.  Reviewed the chart and ordering  appropriate labs. In addition, chronic medical problems were addressed.

## 2023-07-02 LAB — HEMOGLOBIN A1C: Hgb A1c MFr Bld: 6.5 % (ref 4.6–6.5)

## 2023-07-02 LAB — RPR: RPR Ser Ql: NONREACTIVE

## 2023-07-06 MED ORDER — VITAMIN D (ERGOCALCIFEROL) 1.25 MG (50000 UNIT) PO CAPS: 50000.0000 [IU] | ORAL_CAPSULE | ORAL | 0 refills | Status: AC

## 2023-07-06 NOTE — Addendum Note (Signed)
Addended by: Conrad Florence D on: 07/06/2023 10:36 AM   Modules accepted: Orders

## 2023-07-07 ENCOUNTER — Telehealth: Payer: Self-pay | Admitting: Internal Medicine

## 2023-07-07 NOTE — Telephone Encounter (Signed)
Copied from CRM [id number]. Topic: Medicare AWV >> Jul 07, 2023  2:45 PM Payton Doughty wrote: Reason for CRM: Called LVM 07/07/2023 to schedule Annual Wellness Visit  Verlee Rossetti; Care Guide Ambulatory Clinical Support [place] [facility] Direct Dial: [phone]

## 2023-07-22 ENCOUNTER — Other Ambulatory Visit: Payer: Self-pay | Admitting: Internal Medicine

## 2023-07-26 ENCOUNTER — Telehealth: Payer: Self-pay | Admitting: Internal Medicine

## 2023-07-26 NOTE — Telephone Encounter (Signed)
Copied from CRM [id number]. Topic: Medicare AWV >> Jul 26, 2023  1:21 PM Payton Doughty wrote: Reason for CRM: Called LVM 07/26/2023 to schedule Annual Wellness Visit  Verlee Rossetti; Care Guide Ambulatory Clinical Support [place] [facility] Direct Dial: [phone]

## 2023-08-11 ENCOUNTER — Encounter: Payer: Self-pay | Admitting: Podiatry

## 2023-08-11 ENCOUNTER — Ambulatory Visit: Payer: Medicare Other | Admitting: Podiatry

## 2023-08-11 VITALS — Ht 68.0 in | Wt 200.2 lb

## 2023-08-11 DIAGNOSIS — M79675 Pain in left toe(s): Secondary | ICD-10-CM | POA: Diagnosis not present

## 2023-08-11 DIAGNOSIS — M79674 Pain in right toe(s): Secondary | ICD-10-CM

## 2023-08-11 DIAGNOSIS — L84 Corns and callosities: Secondary | ICD-10-CM

## 2023-08-11 DIAGNOSIS — I739 Peripheral vascular disease, unspecified: Secondary | ICD-10-CM | POA: Diagnosis not present

## 2023-08-11 DIAGNOSIS — B351 Tinea unguium: Secondary | ICD-10-CM

## 2023-08-12 ENCOUNTER — Telehealth: Payer: Self-pay | Admitting: Internal Medicine

## 2023-08-12 NOTE — Telephone Encounter (Signed)
Please advise 

## 2023-08-12 NOTE — Telephone Encounter (Signed)
Pt's wife called to ask what type of antihistamine she can give her husband. He has occasional drip from his nose and the mucus is clear. She just wants to make sure there are no interactions with current meds. Please advise

## 2023-08-13 NOTE — Telephone Encounter (Signed)
Spoke w/ Liborio Nixon- Pt's wife, informed PCP recommendations. Liborio Nixon verbalized understanding.

## 2023-08-13 NOTE — Telephone Encounter (Signed)
Allegra 60 mg BID PRN (OTC) is ok

## 2023-08-17 ENCOUNTER — Encounter: Payer: Self-pay | Admitting: Podiatry

## 2023-08-17 NOTE — Progress Notes (Signed)
  Subjective:  Patient ID: [name], male    DOB: [birth year],  MRN: [id number]  [name] presents to clinic today for at risk foot care. Patient has h/o PAD. He is accompanied by his wife on today's visit. Chief Complaint  Patient presents with   Nail Problem    Pt is here for RFC, not a diabetic PCP is Dr Drue Novel and LOV was in October.   New problem(s): None.   PCP is Wanda Plump, MD.  Allergies  Allergen Reactions   Norvasc [Amlodipine Besylate] Swelling    Review of Systems: Negative except as noted in the HPI.  Objective: No changes noted in today's physical examination. There were no vitals filed for this visit. [name] is a pleasant 87 y.o. male in NAD. AAO x 3.  Vascular Examination: CFT <3 seconds b/l. DP pulses faintly palpable b/l. PT pulses nonpalpable b/l. Digital hair absent. Skin temperature gradient warm to warm b/l. No pain with calf compression. No ischemia or gangrene. No cyanosis or clubbing noted b/l. Evidence of chronic venous insufficiency b/l LE.   Neurological Examination: Sensation grossly intact b/l with 10 gram monofilament. Vibratory sensation intact b/l.   Dermatological Examination: Pedal skin warm and supple b/l. No open wounds b/l. No interdigital macerations. Toenails 1-5 b/l thick, discolored, elongated with subungual debris and pain on dorsal palpation.    Hyperkeratotic lesion(s) R 3rd toe and submet head 1 right foot.  No erythema, no edema, no drainage, no fluctuance.  Musculoskeletal Examination: Muscle strength 5/5 to all lower extremity muscle groups bilaterally. Hammertoe right 3rd toe. HAV with bunion deformity noted b/l LE.  Radiographs: None  Last HgA1c:      Latest Ref Rng & Units 07/01/2023   12:13 PM 01/15/2023   10:58 AM 10/09/2022   11:48 AM  Hemoglobin A1C  Hemoglobin-A1c 4.6 - 6.5 % 6.5  6.6  6.3    Assessment/Plan: 1. Pain due to onychomycosis of toenails of both feet   2. Corns and callosities   3.  PAD (peripheral artery disease) (HCC)    -Patient was evaluated today. All questions/concerns addressed on today's visit. -Patient to continue soft, supportive shoe gear daily. -Toenails 1-5 b/l were debrided in length and girth with sterile nail nippers and dremel without iatrogenic bleeding.  -Corn(s) R 3rd toe and callus(es) submet head 1 right foot were pared utilizing sterile scalpel blade without incident. Total number debrided =2. -Patient/POA to call should there be question/concern in the interim.   Return in about 3 months (around 11/11/2023).  Freddie Breech, DPM      [place] LOCATION: [address], [place] [address]                   Office [phone]   [facility] LOCATION: [address] [place], [place] [address] Office [phone]

## 2023-09-27 ENCOUNTER — Other Ambulatory Visit: Payer: Self-pay | Admitting: Internal Medicine

## 2023-09-27 ENCOUNTER — Other Ambulatory Visit: Payer: Self-pay | Admitting: Cardiovascular Disease

## 2023-09-27 DIAGNOSIS — I482 Chronic atrial fibrillation, unspecified: Secondary | ICD-10-CM

## 2023-09-27 NOTE — Telephone Encounter (Signed)
 Eliquis 5mg  refill request received. Patient is 88 years old, weight-90.8kg, Crea-1.03 on 07/01/23, Diagnosis-Afib, and last seen by Dr. Eden Emms on 12/15/22. Dose is appropriate based on dosing criteria. Will send in refill to requested pharmacy.

## 2023-09-30 ENCOUNTER — Other Ambulatory Visit: Payer: Self-pay | Admitting: Internal Medicine

## 2023-10-01 ENCOUNTER — Other Ambulatory Visit: Payer: Self-pay | Admitting: Internal Medicine

## 2023-10-06 ENCOUNTER — Encounter: Payer: Self-pay | Admitting: Internal Medicine

## 2023-10-06 ENCOUNTER — Ambulatory Visit: Payer: Medicare Other | Admitting: Internal Medicine

## 2023-10-06 VITALS — BP 130/80 | HR 69 | Temp 97.9°F | Resp 18 | Ht 68.0 in | Wt 203.4 lb

## 2023-10-06 DIAGNOSIS — E039 Hypothyroidism, unspecified: Secondary | ICD-10-CM

## 2023-10-06 DIAGNOSIS — G910 Communicating hydrocephalus: Secondary | ICD-10-CM | POA: Diagnosis not present

## 2023-10-06 DIAGNOSIS — R601 Generalized edema: Secondary | ICD-10-CM

## 2023-10-06 DIAGNOSIS — F03A0 Unspecified dementia, mild, without behavioral disturbance, psychotic disturbance, mood disturbance, and anxiety: Secondary | ICD-10-CM | POA: Diagnosis not present

## 2023-10-06 DIAGNOSIS — I10 Essential (primary) hypertension: Secondary | ICD-10-CM

## 2023-10-06 LAB — TSH: TSH: 2.78 u[IU]/mL (ref 0.35–5.50)

## 2023-10-06 MED ORDER — DONEPEZIL HCL 5 MG PO TABS: 5.0000 mg | ORAL_TABLET | Freq: Every day | ORAL | 1 refills | Status: DC

## 2023-10-06 MED ORDER — FUROSEMIDE 40 MG PO TABS: ORAL_TABLET | ORAL | Status: DC

## 2023-10-06 MED ORDER — VITAMIN D3 25 MCG (1000 UT) PO CAPS: 2000.0000 [IU] | ORAL_CAPSULE | Freq: Every day | ORAL | Status: AC

## 2023-10-06 NOTE — Progress Notes (Signed)
 Subjective:    Patient ID: [name], male    DOB: 1935/09/23, 88 y.o.   MRN: [id number]  DOS:  10/06/2023 Type of visit - description: Follow-up, here with his wife.  In general things are stable. Memory is about the same. He is not taking Lasix  as recommended. Gait is somewhat limited, legs get stiff. Denies headache, nausea or vomiting. No falls.  Wt Readings from Last 3 Encounters:  10/06/23 203 lb 6 oz (92.3 kg)  08/11/23 200 lb 3.2 oz (90.8 kg)  07/01/23 200 lb 3.2 oz (90.8 kg)     Review of Systems See above   Past Medical History:  Diagnosis Date   A-fib (HCC)    Arthritis    BPH (benign prostatic hypertrophy)    Carotid artery disease (HCC)    Doppler, December 08, 2011, 00 39% R. ICA, 40-59% LICA, followup 1 year   Coronary artery disease    DES Circumflex or CVA 2006  /  clear, February, 2011, EF 70%, no ischemia or   Ejection fraction    EF 60%, echo, 2009, mildly calcified aortic leaflets   History of colonic polyps    Hyperlipidemia    Hypertension    Hypothyroidism    Lumbar radiculopathy    Multiple thyroid  nodules    Avascular echogenic areas noted in the right thyroid  at the time of carotid Doppler.    PONV (postoperative nausea and vomiting)    "nausea with first hip surgery"   Skin cancer (melanoma) (HCC)    PMH of    Spinal stenosis, lumbar    Tremor    Fine tremor right upper extremity   Venous insufficiency     Past Surgical History:  Procedure Laterality Date   COLONOSCOPY     CORONARY ANGIOPLASTY WITH STENT PLACEMENT  2006   x 2 stents; DES to CX and dRCA '06   KNEE SURGERY  1970's   "chipped bone"   LUMBAR LAMINECTOMY/DECOMPRESSION MICRODISCECTOMY Left 07/13/2016   Procedure: LUMBAR LAMINECTOMY AND FORAMINOTOMY Lumbar two, three, Lumbar three-four, Lumbar four-five, LEFT Lumbar five-Sacral one DISECTOMY;  Surgeon: Garry Kansas, MD;  Location: [facility] OR;  Service: Neurosurgery;  Laterality: Left;   TOE SURGERY  1997    TONSILLECTOMY     TOTAL HIP ARTHROPLASTY Left 2007   TOTAL HIP ARTHROPLASTY Right 2010   TOTAL KNEE ARTHROPLASTY Right 06/25/2014   Procedure: RIGHT TOTAL KNEE ARTHROPLASTY;  Surgeon: Aurther Blue, MD;  Location: [facility];  Service: Orthopedics;  Laterality: Right;   VENTRICULOPERITONEAL SHUNT Right 07/09/2022   Procedure: SHUNT INSERTION VENTRICULAR-PERITONEAL;  Surgeon: Garry Kansas, MD;  Location: [facility] OR;  Service: Neurosurgery;  Laterality: Right;    Current Outpatient Medications  Medication Instructions   ascorbic acid (VITAMIN C) 500 mg, Daily   aspirin  81 mg, Oral, Daily   atorvastatin  (LIPITOR) 60 mg, Oral, Daily at bedtime   benazepril  (LOTENSIN ) 30 mg, Oral, Daily   diltiazem  (DILT-XR) 180 mg, Oral, Daily   donepezil  (ARICEPT ) 5 mg, Oral, Daily at bedtime   Eliquis  5 mg, Oral, 2 times daily   furosemide  (LASIX ) 40 MG tablet Take  1 tablet in the morning, 1 tablet at noon   levothyroxine  (SYNTHROID ) 200 mcg, Oral, Daily before breakfast   LUMIGAN 0.01 % SOLN 1 drop, Daily at bedtime   metoprolol  succinate (TOPROL -XL) 25 MG 24 hr tablet 1/2 tablet a day as needed for pulse > 80   nitroGLYCERIN  (NITROSTAT ) 0.4 MG SL tablet PLACE 1 TABLET UNDER THE  TONGUE EVERY 5 MINUTES AS NEEDED FOR CHEST PAIN   Timolol  Maleate 0.5 % (DAILY) SOLN 1 drop, 2 times daily   Vitamin D3 2,000 Units, Oral, Daily       Objective:   Physical Exam BP 130/80   Pulse 69   Temp 97.9 F (36.6 C) (Oral)   Resp 18   Ht 5\' 8"  (1.727 m)   Wt 203 lb 6 oz (92.3 kg)   SpO2 97%   BMI 30.92 kg/m  General:   Well developed, NAD, BMI noted. HEENT:  Normocephalic . Face symmetric, atraumatic Lungs:  CTA B Normal respiratory effort, no intercostal retractions, no accessory muscle use. Heart: Irregularly irregular   Lower extremities: no pretibial edema bilaterally  Skin: Not pale. Not jaundice Neurologic:  alert & oriented X3.  Speech normal, gait assisted by a cane Psych--   Behavior  appropriate. No anxious or depressed appearing.      Assessment     Assessment  (Transfer from  Dr. Donnice Gale [phone]) Hyperglycemia, A1c 6.2 2013 HTN Hyperlipidemia Hypothyroidism  Thyroid  US  2014-- no nodules, inhomogeneous  CV: --CAD: stents 2006;  low risk nuclear scan 2011, 03-2016 --A Fib dx 09/2018 at regular cards OV, on Eliquis   --Carotid artery disease US  12/2014:   Stable, 1-39% RICA stenosis. Stable, 40-59% LICA stenosis. US  12/2015  US  04/2020: 0-39% B 01/2023: 1 to 39%.  Stable,rec  no further routine ultrasounds R leg edema, US  (-)   DVT 08-2014 and 01/2019 [facility] DJD: Dr Rossie Coon, back surgery Dr Larrie Po 06-2016 Venous insufficiency, chronic R>L edema Tremor Glaucoma Normal pressure hydrocephalus (ventriculoperitoneal shunt insertion October 2023, gait improved) H/o skin cancer , denies Melanoma; sees derm, had a visit ~ 09/2017, Dr Del Favia  PLAN Dementia: MMSE recently was 22, blood work was good other than the vitamin D  being low.  We again talked about medication and at this time they agreed to start Aricept , prescription sent, watch for side effects, reassess in 3 months. Normal pressure hydrocephalus: S/p ventriculoperitoneal shunt 06/2022, again she plans to reach to neurosurgery to be sure things are ok (patient's wife is concerned that hydrocephalus may be affecting his memory) Vitamin D  deficiency: On ergocalciferol , recommend daily supplements. HTN: On Lotensin , diltiazem , metoprolol  and Lasix . He is supposed to be taking Lasix  40 mg 1.5 tablet twice daily however he is taking Lasix  only in the morning because otherwise he urinates too much at night. Plan: Will change Lasix  to 1 tablet in the morning and 1 at noon. Hypothyroidism: Check TSH RTC 3 months.

## 2023-10-06 NOTE — Assessment & Plan Note (Signed)
 Dementia: MMSE recently was 22, blood work was good other than the vitamin D  being low.  We again talked about medication and at this time they agreed to start Aricept , prescription sent, watch for side effects, reassess in 3 months. Normal pressure hydrocephalus: S/p ventriculoperitoneal shunt 06/2022, again she plans to reach to neurosurgery to be sure things are ok (patient's wife is concerned that hydrocephalus may be affecting his memory) Vitamin D  deficiency: On ergocalciferol , recommend daily supplements. HTN: On Lotensin , diltiazem , metoprolol  and Lasix . He is supposed to be taking Lasix  40 mg 1.5 tablet twice daily however he is taking Lasix  only in the morning because otherwise he urinates too much at night. Plan: Will change Lasix  to 1 tablet in the morning and 1 at noon. Hypothyroidism: Check TSH RTC 3 months.

## 2023-10-06 NOTE — Patient Instructions (Addendum)
 Start Aricept  5 mg 1 tablet every day to help your memory  Furosemide  (Lasix ): Take 1 tablet in the morning and 1 at noon.  Be sure you take vitamin D  2000 units (over-the-counter) every day.   Check the  blood pressure regularly Blood pressure goal:  between 110/65 and  135/85. If it is consistently higher or lower, let me know     GO TO THE LAB : Get the blood work     Next visit with me in 3 months Please schedule it at the front desk

## 2023-10-20 DIAGNOSIS — H401134 Primary open-angle glaucoma, bilateral, indeterminate stage: Secondary | ICD-10-CM | POA: Diagnosis not present

## 2023-10-20 DIAGNOSIS — H04123 Dry eye syndrome of bilateral lacrimal glands: Secondary | ICD-10-CM | POA: Diagnosis not present

## 2023-10-20 DIAGNOSIS — H52203 Unspecified astigmatism, bilateral: Secondary | ICD-10-CM | POA: Diagnosis not present

## 2023-10-20 DIAGNOSIS — H25013 Cortical age-related cataract, bilateral: Secondary | ICD-10-CM | POA: Diagnosis not present

## 2023-10-20 DIAGNOSIS — H25043 Posterior subcapsular polar age-related cataract, bilateral: Secondary | ICD-10-CM | POA: Diagnosis not present

## 2023-10-20 DIAGNOSIS — H2513 Age-related nuclear cataract, bilateral: Secondary | ICD-10-CM | POA: Diagnosis not present

## 2023-10-20 DIAGNOSIS — H524 Presbyopia: Secondary | ICD-10-CM | POA: Diagnosis not present

## 2023-11-23 ENCOUNTER — Ambulatory Visit: Payer: Medicare Other | Admitting: Podiatry

## 2023-12-07 DIAGNOSIS — G910 Communicating hydrocephalus: Secondary | ICD-10-CM | POA: Diagnosis not present

## 2023-12-14 ENCOUNTER — Encounter: Payer: Self-pay | Admitting: Podiatry

## 2023-12-14 ENCOUNTER — Ambulatory Visit: Admitting: Podiatry

## 2023-12-14 VITALS — Ht 68.0 in | Wt 203.4 lb

## 2023-12-14 DIAGNOSIS — I739 Peripheral vascular disease, unspecified: Secondary | ICD-10-CM

## 2023-12-14 DIAGNOSIS — L84 Corns and callosities: Secondary | ICD-10-CM | POA: Diagnosis not present

## 2023-12-14 DIAGNOSIS — M79675 Pain in left toe(s): Secondary | ICD-10-CM

## 2023-12-14 DIAGNOSIS — M79674 Pain in right toe(s): Secondary | ICD-10-CM | POA: Diagnosis not present

## 2023-12-14 DIAGNOSIS — B351 Tinea unguium: Secondary | ICD-10-CM | POA: Diagnosis not present

## 2023-12-14 NOTE — Progress Notes (Signed)
  Subjective:  Patient ID: [name], male    DOB: Feb 04, 1936,  MRN: [id number]  [name] presents to clinic today for at risk foot care. Patient has h/o PAD and corn(s) right foot, callus(es) right foot and painful elongated, discolored, dystrophic nails.  Pain interferes with ambulation. Aggravating factors include wearing enclosed shoe gear. Painful toenails interfere with ambulation. Aggravating factors include wearing enclosed shoe gear. Pain is relieved with periodic professional debridement. Painful corns and calluses are aggravated when weightbearing with and without shoegear. Pain is relieved with periodic professional debridement.  Chief Complaint  Patient presents with   Nail Problem    Pt is here for [facility] PCP is Dr Drue Novel and LOV was in January.   New problem(s): None.   PCP is Wanda Plump, MD.  Allergies  Allergen Reactions   Norvasc [Amlodipine Besylate] Swelling    Review of Systems: Negative except as noted in the HPI.  Objective: No changes noted in today's physical examination. There were no vitals filed for this visit. [name] is a pleasant 88 y.o. male in NAD. AAO x 3.  Vascular Examination: CFT <3 seconds b/l. DP pulses faintly palpable b/l. PT pulses nonpalpable b/l. Digital hair absent. Skin temperature gradient warm to warm b/l. No pain with calf compression. No ischemia or gangrene. No cyanosis or clubbing noted b/l. Evidence of chronic venous insufficiency b/l LE.   Neurological Examination: Sensation grossly intact b/l with 10 gram monofilament. Vibratory sensation intact b/l.   Dermatological Examination: Pedal skin warm and supple b/l. No open wounds b/l. No interdigital macerations. Toenails 1-5 b/l thick, discolored, elongated with subungual debris and pain on dorsal palpation.    Hyperkeratotic lesion(s) R 3rd toe and submet head 1 right foot.  No erythema, no edema, no drainage, no fluctuance.  Musculoskeletal Examination: Muscle  strength 5/5 to all lower extremity muscle groups bilaterally. Hammertoe right 3rd toe. HAV with bunion deformity noted b/l LE.  Radiographs: None  Last HgA1c:      Latest Ref Rng & Units 07/01/2023   12:13 PM 01/15/2023   10:58 AM  Hemoglobin A1C  Hemoglobin-A1c 4.6 - 6.5 % 6.5  6.6     Assessment/Plan: 1. Pain due to onychomycosis of toenails of both feet   2. Corns and callosities   3. PAD (peripheral artery disease) (HCC)   -Consent given for treatment as described below: -Examined patient. -Mycotic toenails 1-5 bilaterally were debrided in length and girth with sterile nail nippers without incident. -Corn(s) right third digit pared utilizing sterile scalpel blade without complication or incident. Total number debrided=1. -Callus(es) submet head 1 right foot pared utilizing sterile scalpel blade without complication or incident. Total number debrided =1. -Patient/POA to call should there be question/concern in the interim.   Return in about 3 months (around 03/15/2024).  Freddie Breech, DPM      [place] LOCATION: [address] [place] [address]                   Office [phone]   [facility] LOCATION: [address] [place], [place] [address] Office [phone]

## 2023-12-27 ENCOUNTER — Other Ambulatory Visit: Payer: Self-pay | Admitting: Internal Medicine

## 2023-12-29 DIAGNOSIS — D225 Melanocytic nevi of trunk: Secondary | ICD-10-CM | POA: Diagnosis not present

## 2023-12-29 DIAGNOSIS — Z08 Encounter for follow-up examination after completed treatment for malignant neoplasm: Secondary | ICD-10-CM | POA: Diagnosis not present

## 2023-12-29 DIAGNOSIS — Z85828 Personal history of other malignant neoplasm of skin: Secondary | ICD-10-CM | POA: Diagnosis not present

## 2023-12-29 DIAGNOSIS — Z872 Personal history of diseases of the skin and subcutaneous tissue: Secondary | ICD-10-CM | POA: Diagnosis not present

## 2023-12-29 DIAGNOSIS — L814 Other melanin hyperpigmentation: Secondary | ICD-10-CM | POA: Diagnosis not present

## 2023-12-29 DIAGNOSIS — L821 Other seborrheic keratosis: Secondary | ICD-10-CM | POA: Diagnosis not present

## 2023-12-29 DIAGNOSIS — L570 Actinic keratosis: Secondary | ICD-10-CM | POA: Diagnosis not present

## 2024-01-05 ENCOUNTER — Ambulatory Visit: Payer: Medicare Other | Admitting: Internal Medicine

## 2024-01-05 ENCOUNTER — Encounter: Payer: Self-pay | Admitting: Internal Medicine

## 2024-01-05 VITALS — BP 128/80 | HR 61 | Temp 97.7°F | Resp 18 | Ht 68.0 in | Wt 201.0 lb

## 2024-01-05 DIAGNOSIS — R739 Hyperglycemia, unspecified: Secondary | ICD-10-CM | POA: Diagnosis not present

## 2024-01-05 DIAGNOSIS — E039 Hypothyroidism, unspecified: Secondary | ICD-10-CM | POA: Diagnosis not present

## 2024-01-05 DIAGNOSIS — I10 Essential (primary) hypertension: Secondary | ICD-10-CM

## 2024-01-05 DIAGNOSIS — F03C0 Unspecified dementia, severe, without behavioral disturbance, psychotic disturbance, mood disturbance, and anxiety: Secondary | ICD-10-CM | POA: Diagnosis not present

## 2024-01-05 LAB — BASIC METABOLIC PANEL WITH GFR
BUN: 23 mg/dL (ref 6–23)
CO2: 26 meq/L (ref 19–32)
Calcium: 9.4 mg/dL (ref 8.4–10.5)
Chloride: 104 meq/L (ref 96–112)
Creatinine, Ser: 1.02 mg/dL (ref 0.40–1.50)
GFR: 66.88 mL/min (ref >60.00)
Glucose, Bld: 91 mg/dL (ref 70–99)
Potassium: 4 meq/L (ref 3.5–5.1)
Sodium: 139 meq/L (ref 135–145)

## 2024-01-05 LAB — HEMOGLOBIN A1C: Hgb A1c MFr Bld: 6.6 % — ABNORMAL HIGH (ref 4.6–6.5)

## 2024-01-05 LAB — TSH: TSH: 2.71 u[IU]/mL (ref 0.35–5.50)

## 2024-01-05 MED ORDER — DONEPEZIL HCL 10 MG PO TABS: 10.0000 mg | ORAL_TABLET | Freq: Every day | ORAL | 1 refills | Status: DC

## 2024-01-05 NOTE — Patient Instructions (Addendum)
 INSTRUCTIONS  FOR TODAY  Increase Aricept to 10 mg Monday Other medications the same  GO TO THE LAB : Get the blood work     Next office visit for a checkup in 3 months Please make an appointment before you leave today

## 2024-01-05 NOTE — Progress Notes (Signed)
 Subjective:    Patient ID: [name], male    DOB: [birth year], 88 y.o.   MRN: [id number]  DOS:  01/05/2024 Type of visit - description: Follow-up  Here with his wife. Chronic medical problems addressed. Pt's wife feels [name] is doing very well. Memory not getting worse. He is anticoagulated, no bleeding. The patient himself feels actually great. Appetite is good. No recent falls.  Review of Systems See above   Past Medical History:  Diagnosis Date   A-fib [facility])    Arthritis    BPH (benign prostatic hypertrophy)    Carotid artery disease (HCC)    Doppler, December 08, 2011, 00 39% R. ICA, 40-59% LICA, followup 1 year   Coronary artery disease    DES Circumflex or CVA 2006  /  clear, February, 2011, EF 70%, no ischemia or   Ejection fraction    EF 60%, echo, 2009, mildly calcified aortic leaflets   History of colonic polyps    Hyperlipidemia    Hypertension    Hypothyroidism    Lumbar radiculopathy    Multiple thyroid  nodules    Avascular echogenic areas noted in the right thyroid  at the time of carotid Doppler.    PONV (postoperative nausea and vomiting)    "nausea with first hip surgery"   Skin cancer (melanoma) (HCC)    PMH of    Spinal stenosis, lumbar    Tremor    Fine tremor right upper extremity   Venous insufficiency     Past Surgical History:  Procedure Laterality Date   COLONOSCOPY     CORONARY ANGIOPLASTY WITH STENT PLACEMENT  2006   x 2 stents; DES to CX and dRCA '06   KNEE SURGERY  1970's   "chipped bone"   LUMBAR LAMINECTOMY/DECOMPRESSION MICRODISCECTOMY Left 07/13/2016   Procedure: LUMBAR LAMINECTOMY AND FORAMINOTOMY Lumbar two, three, Lumbar three-four, Lumbar four-five, LEFT Lumbar five-Sacral one DISECTOMY;  Surgeon: Garry Kansas, MD;  Location: [facility] OR;  Service: Neurosurgery;  Laterality: Left;   TOE SURGERY  1997   TONSILLECTOMY     TOTAL HIP ARTHROPLASTY Left 2007   TOTAL HIP ARTHROPLASTY Right 2010   TOTAL KNEE ARTHROPLASTY Right  06/25/2014   Procedure: RIGHT TOTAL KNEE ARTHROPLASTY;  Surgeon: Aurther Blue, MD;  Location: [facility] ORS;  Service: Orthopedics;  Laterality: Right;   VENTRICULOPERITONEAL SHUNT Right 07/09/2022   Procedure: SHUNT INSERTION VENTRICULAR-PERITONEAL;  Surgeon: Garry Kansas, MD;  Location: [facility] OR;  Service: Neurosurgery;  Laterality: Right;    Current Outpatient Medications  Medication Instructions   ascorbic acid (VITAMIN C) 500 mg, Daily   aspirin  81 mg, Oral, Daily   atorvastatin  (LIPITOR) 60 mg, Oral, Daily at bedtime   benazepril  (LOTENSIN ) 30 mg, Oral, Daily   diltiazem  (DILT-XR) 180 mg, Oral, Daily   donepezil  (ARICEPT ) 5 mg, Oral, Daily at bedtime   Eliquis  5 mg, Oral, 2 times daily   furosemide  (LASIX ) 40 MG tablet Take  1 tablet in the morning, 1 tablet at noon   levothyroxine  (SYNTHROID ) 200 mcg, Oral, Daily before breakfast   LUMIGAN 0.01 % SOLN 1 drop, Daily at bedtime   metoprolol  succinate (TOPROL -XL) 25 MG 24 hr tablet 1/2 tablet a day as needed for pulse > 80   nitroGLYCERIN  (NITROSTAT ) 0.4 MG SL tablet PLACE 1 TABLET UNDER THE TONGUE EVERY 5 MINUTES AS NEEDED FOR CHEST PAIN   Timolol  Maleate 0.5 % (DAILY) SOLN 1 drop, 2 times daily   Vitamin D3 2,000 Units, Oral, Daily  Objective:   Physical Exam BP 128/80   Pulse 61   Temp 97.7 F (36.5 C) (Oral)   Resp 18   Ht 5\' 8"  (1.727 m)   Wt 201 lb (91.2 kg)   SpO2 96%   BMI 30.56 kg/m  General:   Well developed, NAD, BMI noted. HEENT:  Normocephalic . Face symmetric, atraumatic Lungs:  CTA B Normal respiratory effort, no intercostal retractions, no accessory muscle use. Heart: Irregularly irregular Lower extremities: +/+++ pretibial edema bilaterally  Skin: Not pale. Not jaundice Neurologic:  alert & oriented to self and place.  Not oriented to time.  Recognizes the wife. Speech normal, gait appropriate for age and unassisted Psych--  Poor short-term memory noted Behavior appropriate. No anxious or  depressed appearing.      Assessment      Assessment  (Transfer from  Dr. Donnice Gale [phone]) Hyperglycemia, A1c 6.2 2013 HTN Hyperlipidemia Hypothyroidism  Thyroid  US  2014-- no nodules, inhomogeneous  CV: --CAD: stents 2006;  low risk nuclear scan 2011, 03-2016 --A Fib dx 09/2018 at regular cards OV, on Eliquis   --Carotid artery disease US  12/2014:   Stable, 1-39% RICA stenosis. Stable, 40-59% LICA stenosis. US  12/2015  US  04/2020: 0-39% B 01/2023: 1 to 39%.  Stable,rec  no further routine ultrasounds R leg edema, US  (-)   DVT 08-2014 and 01/2019 [facility] DJD: Dr Rossie Coon, back surgery Dr Larrie Po 06-2016 Venous insufficiency, chronic R>L edema Tremor Glaucoma Normal pressure hydrocephalus (ventriculoperitoneal shunt insertion October 2023, gait improved) H/o skin cancer , denies Melanoma; sees derm, had a visit ~ 09/2017, Dr Del Favia  PLAN Dementia: At the last visit he started Aricept , the wife reported things are stable, memory is at baseline, no side effects.  We agreed to increase Aricept  to 10 mg, Rx sent. Hyperglycemia: Check A1c HTN: BP looks very good, continue Lotensin , diltiazem , Lasix , metoprolol .  Check BMP. Hypothyroidism: On Synthroid , check TSH. A-fib: Anticoagulated, no apparent bleeding or problems.  No recent falls. Normal pressure hydrocephalus: Saw neurosurgery 12/07/2023, everything was felt to be okay. RTC 3 months

## 2024-01-07 NOTE — Assessment & Plan Note (Signed)
 Dementia: At the last visit he started Aricept , the wife reported things are stable, memory is at baseline, no side effects.  We agreed to increase Aricept  to 10 mg, Rx sent. Hyperglycemia: Check A1c HTN: BP looks very good, continue Lotensin , diltiazem , Lasix , metoprolol .  Check BMP. Hypothyroidism: On Synthroid , check TSH. A-fib: Anticoagulated, no apparent bleeding or problems.  No recent falls. Normal pressure hydrocephalus: Saw neurosurgery 12/07/2023, everything was felt to be okay. RTC 3 months

## 2024-01-25 NOTE — Progress Notes (Signed)
 Date:  01/28/2024   ID:  [name], DOB [birth year], MRN [id number]   PCP:  Ezell Hollow, MD  Cardiologist:   Stann Earnest Electrophysiologist:  None   Evaluation Performed:  Follow-Up Visit  Chief Complaint:  CAD/AFib  History of Present Illness:    [name] is a 88 y.o.  male with a history of CAD s/p DES to LCx (2006), carotid artery disease, HTN, HLD who presents to clinic for follow up. No angina compliant with meds Normal nuclear study 2017 prior to back surgery. LE Edema from varicosities and previous right TKR takes lasix  for this. Dr Neomi Banks did venous duplex for swelling 02/07/19 and no DVT  Seen by Dr Vikki Graves [facility] 05/19/19 and felt saphenous veins too small for intervention and would Rx conservatively with elevation, compression stockings and diuretics has seen wound center for ulcer on right LE and left calf Most recent visit 12/05/22   10/20/18  found to be in new onset afib. He did not notice. No palpitations, mild exertional dyspnea chronic No chest pain.    Started on Eliquis  CHA2DS2-Vasc is 4    He is asymptomatic Tolerating blood thinner  In NSR  TTE done 10/13/18 reviewed EF normal 55-60% severe LAE  ? 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 ICA, 40-59% LICA, followup 1 year   Coronary artery disease    DES Circumflex or CVA 2006  /  clear, February, 2011, EF 70%, no ischemia or   Ejection fraction    EF 60%, echo, 2009, mildly  calcified aortic leaflets   History of colonic polyps    Hyperlipidemia    Hypertension    Hypothyroidism    Lumbar radiculopathy    Multiple thyroid  nodules    Avascular echogenic areas noted in the right thyroid  at the time of carotid Doppler.    PONV (postoperative nausea and vomiting)    "nausea with first hip surgery"   Skin cancer (melanoma) (HCC)    PMH of    Spinal stenosis, lumbar    Tremor    Fine tremor right upper extremity   Venous insufficiency    Past Surgical History:  Procedure Laterality Date   COLONOSCOPY     CORONARY ANGIOPLASTY WITH STENT PLACEMENT  2006   x 2 stents; DES to CX and dRCA '06   KNEE SURGERY  1970's   "chipped bone"   LUMBAR LAMINECTOMY/DECOMPRESSION MICRODISCECTOMY Left 07/13/2016   Procedure: LUMBAR LAMINECTOMY AND FORAMINOTOMY Lumbar two, three, Lumbar three-four, Lumbar four-five, LEFT Lumbar five-Sacral one DISECTOMY;  Surgeon: Garry Kansas, MD;  Location: [facility] OR;  Service: Neurosurgery;  Laterality: Left;   TOE SURGERY  1997   TONSILLECTOMY     TOTAL HIP ARTHROPLASTY Left 2007   TOTAL HIP ARTHROPLASTY Right 2010   TOTAL KNEE ARTHROPLASTY Right 06/25/2014   Procedure: RIGHT TOTAL KNEE ARTHROPLASTY;  Surgeon: Aurther Blue, MD;  Location: [facility];  Service: Orthopedics;  Laterality: Right;   VENTRICULOPERITONEAL SHUNT Right 07/09/2022   Procedure: SHUNT INSERTION VENTRICULAR-PERITONEAL;  Surgeon: Garry Kansas, MD;  Location: [facility] OR;  Service: Neurosurgery;  Laterality: Right;     Current Meds  Medication Sig   ascorbic acid (VITAMIN C) 500 MG tablet Take 500 mg by mouth daily.   aspirin  81 MG tablet Take 1 tablet (81 mg total) by mouth daily.   atorvastatin  (LIPITOR) 40 MG tablet Take 1.5 tablets (60 mg total) by mouth at bedtime.   benazepril  (LOTENSIN ) 20 MG tablet Take 1.5 tablets (30 mg total) by mouth daily.   Cholecalciferol (VITAMIN D3) 25 MCG (1000 UT) CAPS Take 2 capsules (2,000 Units total) by mouth daily.   diltiazem   (DILT-XR) 180 MG 24 hr capsule Take 1 capsule (180 mg total) by mouth daily.   donepezil  (ARICEPT ) 10 MG tablet Take 1 tablet (10 mg total) by mouth at bedtime.   ELIQUIS  5 MG TABS tablet TAKE 1 TABLET BY MOUTH TWICE DAILY   furosemide  (LASIX ) 40 MG tablet Take  1 tablet in the morning, 1 tablet at noon (Patient taking differently: Take  1 tablet in the morning, 1/2 tablet at noon)   levothyroxine  (SYNTHROID ) 100 MCG tablet Take 2 tablets (200 mcg total) by mouth daily before breakfast.   LUMIGAN 0.01 % SOLN Place 1 drop into both eyes at bedtime.   metoprolol  succinate (TOPROL -XL) 25 MG 24 hr tablet 1/2 tablet a day as needed for pulse > 80   nitroGLYCERIN  (NITROSTAT ) 0.4 MG SL tablet PLACE 1 TABLET UNDER THE TONGUE EVERY 5 MINUTES AS NEEDED FOR CHEST PAIN   Timolol  Maleate 0.5 % (DAILY) SOLN Apply 1 drop to eye in the morning and at bedtime. At breakfast and evening meals     Allergies:   Norvasc [amlodipine besylate]   Social History   Tobacco Use   Smoking status: Former    Current packs/day: 0.00    Types: Cigarettes    Quit date: 09/21/1984    Years since quitting: 39.3   Smokeless tobacco: Never   Tobacco comments:    smoked 1958-1986, up to 1 ppd  Vaping Use   Vaping status: Never Used  Substance Use Topics   Alcohol use: Yes    Comment: socially on occasion   Drug use: No     Family Hx: The patient's family history includes Colon cancer (age of onset: 71) in his father; Coronary artery disease in his mother; Diabetes in his maternal grandfather and sister; Heart attack (age of onset: 57) in his sister; Heart attack (age of onset: 92) in his maternal grandfather; Hypertension in his father; Pancreatitis in his father. There is no history of Thyroid  disease, Stroke, or Prostate cancer.  ROS:   Please see the history of present illness.     All other systems reviewed and are negative.   Prior CV studies:   The following studies were reviewed today:  Venous Duplex  02/07/19 TTE 10/13/18 Carotid 05/07/20    Labs/Other Tests and Data Reviewed:    EKG:   SR rate 61 nonspecific ST changes 12/14/19 01/28/2024 NSR rate 55 nonspecific ST changes 01/28/2024 Aflutter rate 68 no acute changes   Recent Labs: 07/01/2023: Hemoglobin 16.5; Platelets 218.0 01/05/2024: BUN 23; Creatinine, Ser 1.02; Potassium 4.0; Sodium 139; TSH 2.71   Recent Lipid Panel Lab Results  Component Value Date/Time   CHOL 129 07/01/2023 12:13 PM   CHOL  161 10/31/2014 08:24 AM   TRIG 93.0 07/01/2023 12:13 PM   TRIG 81 10/31/2014 08:24 AM   TRIG 86 07/23/2006 10:12 AM   HDL 40.20 07/01/2023 12:13 PM   HDL 49 10/31/2014 08:24 AM   CHOLHDL 3 07/01/2023 12:13 PM   LDLCALC 70 07/01/2023 12:13 PM   LDLCALC 96 10/31/2014 08:24 AM    Wt Readings from Last 3 Encounters:  01/28/24 202 lb 9.6 oz (91.9 kg)  01/05/24 201 lb (91.2 kg)  12/14/23 203 lb 6.1 oz (92.3 kg)     Objective:    Vital Signs:  BP (!) 140/50   Pulse 61   Ht 5\' 8"  (1.727 m)   Wt 202 lb 9.6 oz (91.9 kg)   SpO2 95%   BMI 30.81 kg/m    Affect appropriate Healthy:  appears stated age HEENT: normal Neck supple with no adenopathy JVP normal no bruits no thyromegaly Lungs clear with no wheezing and good diaphragmatic motion Heart:  S1/S2 no murmur, no rub, gallop or click PMI normal Abdomen: benighn, BS positve, no tenderness, no AAA no bruit.  No HSM or HJR Distal pulses intact with no bruits Plus 2 RLE edema plus 1 LLE with varicosities Neuro intention tremor worse on left arm  Skin warm and dry Post right TKR     ASSESSMENT & PLAN:    LE edema: venous reflux f/u Dr Vikki Graves continue f/u with wound center Continue lasix  and local care   CAD: DES to circumflex 2006  stable with low risk Myoview  03/31/16 . Continue ASA, statin and BB   Carotid artery disease: stable doppler study in 02/03/23   Plaque no significant stenosis .     HLD: continue statin. Followed by Dr. Neomi Banks LDL 68 08/04/21    Hypothyroidism:  continue synthroid  followed by Dr. Neomi Banks TSH 2.78   Orho:  Post right TKR and lumbar surgery     Afib:  New diagnosis  09/28/18 On Eliquis  5 bid for CHADVASC 4.  Continue cardizem  in NSR today CR 1.02 Weight 91.2 kg Update TTE   Gait:  post shunt for hydrocephalus F/U Dr Larrie Po   Medication Adjustments/Labs and Tests Ordered: Current medicines are reviewed at length with the patient today.  Concerns regarding medicines are outlined above.   Tests Ordered:  Echo for afib  Medication Changes:  None   Disposition:  Follow up in 6 months  Signed, Janelle Mediate, MD  01/28/2024 9:47 AM    [facility]

## 2024-01-28 ENCOUNTER — Ambulatory Visit: Payer: Medicare Other | Attending: Cardiovascular Disease | Admitting: Cardiovascular Disease

## 2024-01-28 ENCOUNTER — Encounter: Payer: Self-pay | Admitting: Cardiovascular Disease

## 2024-01-28 VITALS — BP 140/50 | HR 61 | Ht 68.0 in | Wt 202.6 lb

## 2024-01-28 DIAGNOSIS — E782 Mixed hyperlipidemia: Secondary | ICD-10-CM

## 2024-01-28 DIAGNOSIS — I10 Essential (primary) hypertension: Secondary | ICD-10-CM | POA: Diagnosis not present

## 2024-01-28 DIAGNOSIS — I482 Chronic atrial fibrillation, unspecified: Secondary | ICD-10-CM

## 2024-01-28 NOTE — Patient Instructions (Addendum)
 Medication Instructions:  Your physician recommends that you continue on your current medications as directed. Please refer to the Current Medication list given to you today.  *If you need a refill on your cardiac medications before your next appointment, please call your pharmacy*  Lab Work: If you have labs (blood work) drawn today and your tests are completely normal, you will receive your results only by: MyChart Message (if you have MyChart) OR A paper copy in the mail If you have any lab test that is abnormal or we need to change your treatment, we will call you to review the results.  Testing/Procedures: None ordered today.  Follow-Up: At [facility], you and your health needs are our priority.  As part of our continuing mission to provide you with exceptional heart care, our providers are all part of one team.  This team includes your primary Cardiologist (physician) and Advanced Practice Providers or APPs (Physician Assistants and Nurse Practitioners) who all work together to provide you with the care you need, when you need it.  Your next appointment:   12 month(s)  Provider:   Janelle Mediate, MD    We recommend signing up for the patient portal called "MyChart".  Sign up information is provided on this After Visit Summary.  MyChart is used to connect with patients for Virtual Visits (Telemedicine).  Patients are able to view lab/test results, encounter notes, upcoming appointments, etc.  Non-urgent messages can be sent to your provider as well.   To learn more about what you can do with MyChart, go to [email or web address].   Other Instructions

## 2024-02-02 ENCOUNTER — Other Ambulatory Visit: Payer: Self-pay | Admitting: Internal Medicine

## 2024-02-15 ENCOUNTER — Ambulatory Visit (INDEPENDENT_AMBULATORY_CARE_PROVIDER_SITE_OTHER)

## 2024-02-15 VITALS — Ht 68.0 in | Wt 202.0 lb

## 2024-02-15 DIAGNOSIS — Z0000 Encounter for general adult medical examination without abnormal findings: Secondary | ICD-10-CM

## 2024-02-15 NOTE — Progress Notes (Signed)
 za  Subjective:   [name] is a 88 y.o. who presents for a Medicare Wellness preventive visit.  As a reminder, Annual Wellness Visits don't include a physical exam, and some assessments may be limited, especially if this visit is performed virtually. We may recommend an in-person follow-up visit with your provider if needed.  Visit Complete: Virtual I connected with  [name] on 02/15/24 by a audio enabled telemedicine application and verified that I am speaking with the correct person using two identifiers.  Patient Location: Home  Provider Location: Home Office  I discussed the limitations of evaluation and management by telemedicine. The patient expressed understanding and agreed to proceed.  Vital Signs: Because this visit was a virtual/telehealth visit, some criteria may be missing or patient reported. Any vitals not documented were not able to be obtained and vitals that have been documented are patient reported.  VideoDeclined- This patient declined Librarian, academic. Therefore the visit was completed with audio only.  Persons Participating in Visit: Patient assisted by wife Leola Raisin.  AWV Questionnaire: No: Patient Medicare AWV questionnaire was not completed prior to this visit.  Cardiac Risk Factors include: advanced age (>78men, >70 women);dyslipidemia;male gender;hypertension     Objective:     Today's Vitals   02/15/24 1142  Weight: 202 lb (91.6 kg)  Height: 5\' 8"  (1.727 m)   Body mass index is 30.71 kg/m.     02/15/2024   11:59 AM 08/27/2022    2:51 PM 07/09/2022    9:30 PM 07/09/2022    9:20 PM 07/01/2022    2:21 PM 06/12/2022    1:04 PM 04/29/2021    3:36 PM  Advanced Directives  Does Patient Have a Medical Advance Directive? No Yes  Yes Yes Yes Yes  Type of Special educational needs teacher of [place];Living will  Healthcare Power of [employer] of [place];Living will Healthcare Power of  [place];Living will Healthcare Power of [place];Living will  Does patient want to make changes to medical advance directive?   No - Patient declined   No - Patient declined   Copy of Healthcare Power of Attorney in Chart?  No - copy requested  No - copy requested No - copy requested No - copy requested No - copy requested  Would patient like information on creating a medical advance directive? Yes (MAU/Ambulatory/Procedural Areas - Information given)          Current Medications (verified) Outpatient Encounter Medications as of 02/15/2024  Medication Sig   aspirin  81 MG tablet Take 1 tablet (81 mg total) by mouth daily.   atorvastatin  (LIPITOR) 40 MG tablet Take 1.5 tablets (60 mg total) by mouth at bedtime.   benazepril  (LOTENSIN ) 20 MG tablet Take 1.5 tablets (30 mg total) by mouth daily.   diltiazem  (DILT-XR) 180 MG 24 hr capsule Take 1 capsule (180 mg total) by mouth daily.   donepezil  (ARICEPT ) 10 MG tablet Take 1 tablet (10 mg total) by mouth at bedtime.   ELIQUIS  5 MG TABS tablet TAKE 1 TABLET BY MOUTH TWICE DAILY   fexofenadine (ALLEGRA) 180 MG tablet Take 180 mg by mouth daily.   furosemide  (LASIX ) 40 MG tablet Take  1 tablet in the morning, 1 tablet at noon (Patient taking differently: Take  1 tablet in the morning, 1/2 tablet at noon)   levothyroxine  (SYNTHROID ) 100 MCG tablet Take 2 tablets (200 mcg total) by mouth daily before breakfast.   LUMIGAN 0.01 % SOLN Place 1 drop  into both eyes at bedtime.   metoprolol  succinate (TOPROL -XL) 25 MG 24 hr tablet 1/2 tablet a day as needed for pulse > 80   nitroGLYCERIN  (NITROSTAT ) 0.4 MG SL tablet PLACE 1 TABLET UNDER THE TONGUE EVERY 5 MINUTES AS NEEDED FOR CHEST PAIN   Timolol  Maleate 0.5 % (DAILY) SOLN Apply 1 drop to eye in the morning and at bedtime. At breakfast and evening meals   ascorbic acid (VITAMIN C) 500 MG tablet Take 500 mg by mouth daily. (Patient not taking: Reported on 02/15/2024)   Cholecalciferol (VITAMIN D3) 25 MCG (1000  UT) CAPS Take 2 capsules (2,000 Units total) by mouth daily. (Patient not taking: Reported on 02/15/2024)   No facility-administered encounter medications on file as of 02/15/2024.    Allergies (verified) Norvasc [amlodipine besylate]   History: Past Medical History:  Diagnosis Date   A-fib [facility])    Arthritis    BPH (benign prostatic hypertrophy)    Carotid artery disease (HCC)    Doppler, December 08, 2011, 00 39% R. ICA, 40-59% LICA, followup 1 year   Coronary artery disease    DES Circumflex or CVA 2006  /  clear, February, 2011, EF 70%, no ischemia or   Ejection fraction    EF 60%, echo, 2009, mildly calcified aortic leaflets   History of colonic polyps    Hyperlipidemia    Hypertension    Hypothyroidism    Lumbar radiculopathy    Multiple thyroid  nodules    Avascular echogenic areas noted in the right thyroid  at the time of carotid Doppler.    PONV (postoperative nausea and vomiting)    "nausea with first hip surgery"   Skin cancer (melanoma) (HCC)    PMH of    Spinal stenosis, lumbar    Tremor    Fine tremor right upper extremity   Venous insufficiency    Past Surgical History:  Procedure Laterality Date   COLONOSCOPY     CORONARY ANGIOPLASTY WITH STENT PLACEMENT  2006   x 2 stents; DES to CX and dRCA '06   KNEE SURGERY  1970's   "chipped bone"   LUMBAR LAMINECTOMY/DECOMPRESSION MICRODISCECTOMY Left 07/13/2016   Procedure: LUMBAR LAMINECTOMY AND FORAMINOTOMY Lumbar two, three, Lumbar three-four, Lumbar four-five, LEFT Lumbar five-Sacral one DISECTOMY;  Surgeon: Garry Kansas, MD;  Location: [facility] OR;  Service: Neurosurgery;  Laterality: Left;   TOE SURGERY  1997   TONSILLECTOMY     TOTAL HIP ARTHROPLASTY Left 2007   TOTAL HIP ARTHROPLASTY Right 2010   TOTAL KNEE ARTHROPLASTY Right 06/25/2014   Procedure: RIGHT TOTAL KNEE ARTHROPLASTY;  Surgeon: Aurther Blue, MD;  Location: [facility] ORS;  Service: Orthopedics;  Laterality: Right;   VENTRICULOPERITONEAL SHUNT Right  07/09/2022   Procedure: SHUNT INSERTION VENTRICULAR-PERITONEAL;  Surgeon: Garry Kansas, MD;  [facility];  Service: Neurosurgery;  Laterality: Right;   Family History  Problem Relation Age of Onset   Coronary artery disease Mother        carotid endarterectomy bilaterally   Colon cancer Father 12   Hypertension Father    Pancreatitis Father    Diabetes Sister    Diabetes Maternal Grandfather    Heart attack Maternal Grandfather 38   Heart attack Sister 80   Thyroid  disease Neg Hx    Stroke Neg Hx    Prostate cancer Neg Hx    Social History   Socioeconomic History   Marital status: Married    Spouse name: Leola Raisin   Number of children: 2  Years of education: Not on file   Highest education level: Not on file  Occupational History   Occupation: retired, Arts development officer  Tobacco Use   Smoking status: Former    Current packs/day: 0.00    Types: Cigarettes    Quit date: 09/21/1984    Years since quitting: 39.4   Smokeless tobacco: Never   Tobacco comments:    smoked 1958-1986, up to 1 ppd  Vaping Use   Vaping status: Never Used  Substance and Sexual Activity   Alcohol use: Yes    Comment: socially on occasion   Drug use: No   Sexual activity: Yes  Other Topics Concern   Not on file  Social History Narrative   Household- pt and wife   2 daughters, one is a Charity fundraiser @ [employer]   R handed   Caffeine: 3 C of coffee a day   Social Drivers of Corporate investment banker Strain: Low Risk  (02/15/2024)   Overall Financial Resource Strain (CARDIA)    Difficulty of Paying Living Expenses: Not very hard  Food Insecurity: No Food Insecurity (02/15/2024)   Hunger Vital Sign    Worried About Running Out of Food in the Last Year: Never true    Ran Out of Food in the Last Year: Never true  Transportation Needs: No Transportation Needs (02/15/2024)   PRAPARE - Administrator, Civil Service (Medical): No    Lack of Transportation (Non-Medical): No   Physical Activity: Inactive (02/15/2024)   Exercise Vital Sign    Days of Exercise per Week: 0 days    Minutes of Exercise per Session: 0 min  Stress: No Stress Concern Present (02/15/2024)   [employer] of Occupational Health - Occupational Stress Questionnaire    Feeling of Stress : Only a little  Social Connections: Moderately Integrated (02/15/2024)   Social Connection and Isolation Panel [NHANES]    Frequency of Communication with Friends and Family: More than three times a week    Frequency of Social Gatherings with Friends and Family: More than three times a week    Attends Religious Services: 1 to 4 times per year    Active Member of [employer] or Organizations: No    Attends Banker Meetings: Never    Marital Status: Married    Tobacco Counseling Counseling given: Not Answered Tobacco comments: smoked 1958-1986, up to 1 ppd    Clinical Intake:  Pre-visit preparation completed: Yes  Pain : No/denies pain  Diabetes: No  Lab Results  Component Value Date   HGBA1C 6.6 (H) 01/05/2024   HGBA1C 6.5 07/01/2023   HGBA1C 6.6 (H) 01/15/2023     How often do you need to have someone help you when you read instructions, pamphlets, or other written materials from your doctor or pharmacy?: 1 - Never  Interpreter Needed?: No  Information entered by :: Seabron Cypress LPN   Activities of Daily Living     02/15/2024   11:53 AM  In your present state of health, do you have any difficulty performing the following activities:  Hearing? 0  Vision? 0  Difficulty concentrating or making decisions? 1  Walking or climbing stairs? 0  Dressing or bathing? 0  Doing errands, shopping? 1  Preparing Food and eating ? N  Using the Toilet? N  In the past six months, have you accidently leaked urine? N  Do you have problems with loss of bowel control? N  Managing your Medications? Y  Managing your Finances? Y  Housekeeping or managing your Housekeeping? N    Patient  Care Team: Ezell Hollow, MD as PCP - General (Internal Medicine) Loyde Rule, MD as PCP - Cardiology (Cardiology) Loyde Rule, MD as Consulting Physician (Cardiology) Denman Fischer, MD (Dermatology) Liliane Rei, MD as Consulting Physician (Orthopedic Surgery) Garry Kansas, MD as Consulting Physician (Neurosurgery) Rudine Cos, MD as Consulting Physician (Ophthalmology)  Indicate any recent Medical Services you may have received from other than [facility] providers in the past year (date may be approximate).     Assessment:    This is a routine wellness examination for [place].  Hearing/Vision screen Hearing Screening - Comments:: Some hearing loss  Vision Screening - Comments:: Wears rx glasses - up to date with routine eye exams with Dr. Roslynn Coombes    Goals Addressed   None    Depression Screen     02/15/2024   11:56 AM 01/05/2024   11:34 AM 10/06/2023   11:50 AM 01/15/2023   10:29 AM 10/09/2022   11:14 AM 06/12/2022    1:05 PM 05/28/2022   10:16 AM  PHQ 2/9 Scores  PHQ - 2 Score 0 0 0 0 0 0 0    Fall Risk     02/15/2024    2:22 PM 01/05/2024   11:34 AM 10/06/2023   11:51 AM 01/15/2023   10:29 AM 10/09/2022   11:14 AM  Fall Risk   Falls in the past year? 0 0 0 0 0  Number falls in past yr: 0 0 0 0 0  Injury with Fall? 0 0 0 0 0  Risk for fall due to : No Fall Risks      Follow up Falls prevention discussed;Education provided;Falls evaluation completed Falls evaluation completed;Education provided Falls evaluation completed;Education provided Falls evaluation completed Falls evaluation completed    MEDICARE RISK AT HOME:  Medicare Risk at Home Any stairs in or around the home?: No If so, are there any without handrails?: No Home free of loose throw rugs in walkways, pet beds, electrical cords, etc?: Yes Adequate lighting in your home to reduce risk of falls?: Yes Life alert?: No Use of a cane, walker or w/c?: No Grab bars in the bathroom?: Yes Shower chair or  bench in shower?: No Elevated toilet seat or a handicapped toilet?: Yes  TIMED UP AND GO:  Was the test performed?  No  Cognitive Function: Impaired: Patient has current diagnosis of cognitive impairment.    07/01/2023   12:05 PM  MMSE - Mini Mental State Exam  Orientation to time 3  Orientation to Place 5  Registration 3  Attention/ Calculation 4  Recall 0  Language- name 2 objects 2  Language- repeat 0  Language- follow 3 step command 3  Language- read & follow direction 1  Write a sentence 1  Copy design 0  Total score 22        06/12/2022    1:09 PM  6CIT Screen  What Year? 4 points  What month? 3 points  What time? 0 points  Count back from 20 0 points  Months in reverse 4 points  Repeat phrase 10 points  Total Score 21 points    Immunizations Immunization History  Administered Date(s) Administered   Fluad Quad(high Dose 65+) 08/04/2019, 10/25/2020, 08/04/2021   Fluad Trivalent(High Dose 65+) 07/01/2023   Influenza Split 08/12/2011, 09/06/2012   Influenza Whole 07/23/2006, 08/10/2007, 06/27/2008, 08/21/2009, 08/19/2010   Influenza, High Dose Seasonal PF 07/20/2013, 09/19/2015,  09/07/2018   Influenza,inj,Quad PF,6+ Mos 07/14/2016   Influenza-Unspecified 06/21/2014, 10/01/2017, 06/21/2022   PFIZER(Purple Top)SARS-COV-2 Vaccination 11/23/2019, 12/19/2019   PNEUMOCOCCAL CONJUGATE-20 11/10/2021   Pneumococcal Conjugate-13 02/19/2015   Pneumococcal Polysaccharide-23 09/01/2013   Td 09/23/1998   Tdap 09/01/2013   Zoster, Live 09/29/2013    Screening Tests Health Maintenance  Topic Date Due   DTaP/Tdap/Td (3 - Td or Tdap) 09/02/2023   INFLUENZA VACCINE  04/21/2024   Medicare Annual Wellness (AWV)  02/14/2025   Pneumonia Vaccine 29+ Years old  Completed   HPV VACCINES  Aged Out   Meningococcal B Vaccine  Aged Out   COVID-19 Vaccine  Discontinued   Zoster Vaccines- Shingrix   Discontinued    Health Maintenance  Health Maintenance Due  Topic Date Due    DTaP/Tdap/Td (3 - Td or Tdap) 09/02/2023    Additional Screening:  Vision Screening: Recommended annual ophthalmology exams for early detection of glaucoma and other disorders of the eye.  Dental Screening: Recommended annual dental exams for proper oral hygiene  Community Resource Referral / Chronic Care Management: CRR required this visit?  No   CCM required this visit?  No   Plan:    I have personally reviewed and noted the following in the patient's chart:   Medical and social history Use of alcohol, tobacco or illicit drugs  Current medications and supplements including opioid prescriptions. Patient is not currently taking opioid prescriptions. Functional ability and status Nutritional status Physical activity Advanced directives List of other physicians Hospitalizations, surgeries, and ER visits in previous 12 months Vitals Screenings to include cognitive, depression, and falls Referrals and appointments  In addition, I have reviewed and discussed with patient certain preventive protocols, quality metrics, and best practice recommendations. A written personalized care plan for preventive services as well as general preventive health recommendations were provided to patient.   Seabron Cypress [place], [place]   1/61/0960   After Visit Summary: (MyChart) Due to this being a telephonic visit, the after visit summary with patients personalized plan was offered to patient via MyChart   Notes: Nothing significant to report at this time.

## 2024-02-15 NOTE — Patient Instructions (Signed)
 Mr. Heinkel , Thank you for taking time out of your busy schedule to complete your Annual Wellness Visit with me. I enjoyed our conversation and look forward to speaking with you again next year. I, as well as your care team,  appreciate your ongoing commitment to your health goals. Please review the following plan we discussed and let me know if I can assist you in the future. Your Game plan/ To Do List    Follow up Visits: Next Medicare AWV with our clinical staff: In 1 year    Have you seen your provider in the last 6 months (3 months if uncontrolled diabetes)? Yes Next Office Visit with your provider: 04/18/24 @ 11:00  Clinician Recommendations:  Aim for 30 minutes of exercise or brisk walking, 6-8 glasses of water, and 5 servings of fruits and vegetables each day.       This is a list of the screening recommended for you and due dates:  Health Maintenance  Topic Date Due   DTaP/Tdap/Td vaccine (3 - Td or Tdap) 09/02/2023   Flu Shot  04/21/2024   Medicare Annual Wellness Visit  02/14/2025   Pneumonia Vaccine  Completed   HPV Vaccine  Aged Out   Meningitis B Vaccine  Aged Out   COVID-19 Vaccine  Discontinued   Zoster (Shingles) Vaccine  Discontinued    Advanced directives: (ACP Link)Information on Advanced Care Planning can be found at [place]  Secretary of [facility] Advance Health Care Directives Advance Health Care Directives. [email or web address]   Advance Care Planning is important because it:  [x]  Makes sure you receive the medical care that is consistent with your values, goals, and preferences  [x]  It provides guidance to your family and loved ones and reduces their decisional burden about whether or not they are making the right decisions based on your wishes.  Follow the link provided in your after visit summary or read over the paperwork we have mailed to you to help you started getting your Advance Directives in place. If you need assistance in completing these, please reach out to  us  so that we can help you!  See attachments for Preventive Care and Fall Prevention Tips.

## 2024-03-23 ENCOUNTER — Other Ambulatory Visit: Payer: Self-pay | Admitting: Internal Medicine

## 2024-03-23 ENCOUNTER — Other Ambulatory Visit: Payer: Self-pay | Admitting: Cardiovascular Disease

## 2024-03-23 DIAGNOSIS — I482 Chronic atrial fibrillation, unspecified: Secondary | ICD-10-CM

## 2024-03-23 NOTE — Telephone Encounter (Signed)
.  Prescription refill request for Eliquis  received. Indication: a fib Last office visit: 01/28/24 Scr: 1.02 epic 01/05/24 Age: 88 Weight: 91kg

## 2024-03-28 ENCOUNTER — Ambulatory Visit: Admitting: Podiatry

## 2024-04-02 ENCOUNTER — Other Ambulatory Visit: Payer: Self-pay | Admitting: Internal Medicine

## 2024-04-04 ENCOUNTER — Encounter: Payer: Self-pay | Admitting: Podiatry

## 2024-04-04 ENCOUNTER — Ambulatory Visit: Admitting: Podiatry

## 2024-04-04 DIAGNOSIS — M79674 Pain in right toe(s): Secondary | ICD-10-CM | POA: Diagnosis not present

## 2024-04-04 DIAGNOSIS — B351 Tinea unguium: Secondary | ICD-10-CM | POA: Diagnosis not present

## 2024-04-04 DIAGNOSIS — L84 Corns and callosities: Secondary | ICD-10-CM | POA: Diagnosis not present

## 2024-04-04 DIAGNOSIS — I739 Peripheral vascular disease, unspecified: Secondary | ICD-10-CM

## 2024-04-04 DIAGNOSIS — M79675 Pain in left toe(s): Secondary | ICD-10-CM | POA: Diagnosis not present

## 2024-04-04 NOTE — Progress Notes (Signed)
  Subjective:  Patient ID: [name], male    DOB: 1936-06-09,  MRN: [id number]  FARZAD TIBBETTS presents to clinic today for at risk foot care. Patient has h/o PAD and corn(s) right third digit, callus(es) right foot and painful elongated, discolored, dystrophic nails.  Pain interferes with ambulation. Aggravating factors include wearing enclosed shoe gear. Painful toenails interfere with ambulation. Aggravating factors include wearing enclosed shoe gear. Pain is relieved with periodic professional debridement. Painful corns and calluses are aggravated when weightbearing with and without shoegear. Pain is relieved with periodic professional debridement.  Chief Complaint  Patient presents with   RFC    RFC Toenail trim.    New problem(s): None.   PCP is Amon Aloysius FORBES, MD. ARNETTA 01/05/2024.  Allergies  Allergen Reactions   Norvasc [Amlodipine Besylate] Swelling    Review of Systems: Negative except as noted in the HPI.  Objective: No changes noted in today's physical examination. There were no vitals filed for this visit. KEVON TENCH is a pleasant 88 y.o. male in NAD. AAO x 3.  Vascular Examination: CFT <3 seconds b/l. DP pulses faintly palpable b/l. PT pulses nonpalpable b/l. Digital hair absent. Skin temperature gradient warm to warm b/l. No pain with calf compression. No ischemia or gangrene. No cyanosis or clubbing noted b/l. Evidence of chronic venous insufficiency b/l LE.   Neurological Examination: Sensation grossly intact b/l with 10 gram monofilament.  Dermatological Examination: Pedal skin warm and supple b/l. No open wounds b/l. No interdigital macerations. Toenails 1-5 b/l thick, discolored, elongated with subungual debris and pain on dorsal palpation.  Hyperkeratotic lesion(s) distal tip of right 3rd toe and submet head 1 right foot.  No erythema, no edema, no drainage, no fluctuance.  Musculoskeletal Examination: Muscle strength 5/5 to all lower extremity muscle  groups bilaterally. HAV with bunion deformity noted b/l LE. Hammertoe(s) distal tip of right 3rd toe.  Radiographs: None  Last HgA1c:      Latest Ref Rng & Units 01/05/2024   11:58 AM 07/01/2023   12:13 PM  Hemoglobin A1C  Hemoglobin-A1c 4.6 - 6.5 % 6.6  6.5     Assessment/Plan: 1. Pain due to onychomycosis of toenails of both feet   2. Corns and callosities   3. PAD (peripheral artery disease) (HCC)     Patient was evaluated and treated. All patient's and/or POA's questions/concerns addressed on today's visit. Toenails 1-5 debrided in length and girth without incident. Corn(s) and Callus(es) distal tip of right 3rd toe and submet head 1 right foot pared with sharp debridement without incident. Continue soft, supportive shoe gear daily. Report any pedal injuries to medical professional. Call office if there are any questions/concerns. Treatment was provided by assistant Andrez Manchester under my supervision.    Return in about 3 months (around 07/05/2024).  Delon LITTIE Merlin, DPM      [facility] LOCATION: [address] [place] [address]                   Office [phone]   [facility] LOCATION: [address] [place], [place] [address] Office [phone]

## 2024-04-18 ENCOUNTER — Ambulatory Visit: Admitting: Internal Medicine

## 2024-04-19 DIAGNOSIS — H401134 Primary open-angle glaucoma, bilateral, indeterminate stage: Secondary | ICD-10-CM | POA: Diagnosis not present

## 2024-04-19 DIAGNOSIS — H2513 Age-related nuclear cataract, bilateral: Secondary | ICD-10-CM | POA: Diagnosis not present

## 2024-04-26 ENCOUNTER — Encounter: Payer: Self-pay | Admitting: Internal Medicine

## 2024-04-26 ENCOUNTER — Ambulatory Visit: Admitting: Internal Medicine

## 2024-04-26 VITALS — BP 126/80 | HR 67 | Temp 97.5°F | Resp 16 | Ht 68.0 in | Wt 203.4 lb

## 2024-04-26 DIAGNOSIS — I482 Chronic atrial fibrillation, unspecified: Secondary | ICD-10-CM

## 2024-04-26 DIAGNOSIS — E782 Mixed hyperlipidemia: Secondary | ICD-10-CM | POA: Diagnosis not present

## 2024-04-26 DIAGNOSIS — J300 Vasomotor rhinitis: Secondary | ICD-10-CM | POA: Diagnosis not present

## 2024-04-26 DIAGNOSIS — E039 Hypothyroidism, unspecified: Secondary | ICD-10-CM

## 2024-04-26 DIAGNOSIS — F0280 Dementia in other diseases classified elsewhere without behavioral disturbance: Secondary | ICD-10-CM

## 2024-04-26 DIAGNOSIS — E119 Type 2 diabetes mellitus without complications: Secondary | ICD-10-CM

## 2024-04-26 DIAGNOSIS — F03C0 Unspecified dementia, severe, without behavioral disturbance, psychotic disturbance, mood disturbance, and anxiety: Secondary | ICD-10-CM

## 2024-04-26 DIAGNOSIS — I10 Essential (primary) hypertension: Secondary | ICD-10-CM

## 2024-04-26 DIAGNOSIS — I4891 Unspecified atrial fibrillation: Secondary | ICD-10-CM | POA: Diagnosis not present

## 2024-04-26 LAB — CBC WITH DIFFERENTIAL/PLATELET
Basophils Absolute: 0.1 K/uL (ref 0.0–0.1)
Basophils Relative: 1.9 % (ref 0.0–3.0)
Eosinophils Absolute: 0.3 K/uL (ref 0.0–0.7)
Eosinophils Relative: 5.5 % — ABNORMAL HIGH (ref 0.0–5.0)
HCT: 44.8 % (ref 39.0–52.0)
Hemoglobin: 14.7 g/dL (ref 13.0–17.0)
Lymphocytes Relative: 17.5 % (ref 12.0–46.0)
Lymphs Abs: 1 K/uL (ref 0.7–4.0)
MCHC: 32.8 g/dL (ref 30.0–36.0)
MCV: 91.6 fl (ref 78.0–100.0)
Monocytes Absolute: 0.7 K/uL (ref 0.1–1.0)
Monocytes Relative: 11.5 % (ref 3.0–12.0)
Neutro Abs: 3.8 K/uL (ref 1.4–7.7)
Neutrophils Relative %: 63.6 % (ref 43.0–77.0)
Platelets: 193 K/uL (ref 150.0–400.0)
RBC: 4.89 Mil/uL (ref 4.22–5.81)
RDW: 16.6 % — ABNORMAL HIGH (ref 11.5–15.5)
WBC: 6 K/uL (ref 4.0–10.5)

## 2024-04-26 LAB — TSH: TSH: 3.59 u[IU]/mL (ref 0.35–5.50)

## 2024-04-26 LAB — BASIC METABOLIC PANEL WITH GFR
BUN: 24 mg/dL — ABNORMAL HIGH (ref 6–23)
CO2: 31 meq/L (ref 19–32)
Calcium: 9.3 mg/dL (ref 8.4–10.5)
Chloride: 104 meq/L (ref 96–112)
Creatinine, Ser: 1.13 mg/dL (ref 0.40–1.50)
GFR: 59.02 mL/min — ABNORMAL LOW (ref >60.00)
Glucose, Bld: 139 mg/dL — ABNORMAL HIGH (ref 70–99)
Potassium: 3.9 meq/L (ref 3.5–5.1)
Sodium: 141 meq/L (ref 135–145)

## 2024-04-26 LAB — AST: AST: 18 U/L (ref 0–37)

## 2024-04-26 LAB — ALT: ALT: 16 U/L (ref 0–53)

## 2024-04-26 NOTE — Assessment & Plan Note (Signed)
 DM: Last A1c 6.6, stable, diet controlled. HTN: BP looks good, no ABPs.  Continue Lotensin , Ilotycin, Lasix , metoprolol .  Check BMP. Hyperlipidemia: Continue atorvastatin  40 mg Hypothyroidism: On Synthroid , check TSH CAD, A-fib Saw cardiology 01/28/2024, felt to be stable, no changes made.  Anticoagulated without apparent problems, denies blood in the stools or in the urine.  Check CBC. Dementia: Stable.  On Aricept  10 mg daily Vitamin D  deficiency: Encouraged every day  supplements Rhinitis:  New, possibly vasomotor, because it increases with food intake.  On Allegra only, add Astepro Preventive care: Flu and COVID booster this fall.  Tdap. RTC 4 months CPX

## 2024-04-26 NOTE — Progress Notes (Addendum)
 Subjective:    Patient ID: [name], male    DOB: 10/20/1935, 88 y.o.   MRN: [id number]  DOS:  04/26/2024 Type of visit - description: Follow-up, here with his wife  Since the last visit, things are stable. Saw cardiology, note reviewed. Good med compliance. No recent ambulatory BPs. No recent falls. Did report runny nose on and off, on Allegra without much help.  Symptoms are worse when he eats.  Denies chest pain or difficulty breathing.  No palpitation.  Review of Systems See above   Past Medical History:  Diagnosis Date   A-fib [facility])    Arthritis    BPH (benign prostatic hypertrophy)    Carotid artery disease (HCC)    Doppler, December 08, 2011, 00 39% R. ICA, 40-59% LICA, followup 1 year   Coronary artery disease    DES Circumflex or CVA 2006  /  clear, February, 2011, EF 70%, no ischemia or   Ejection fraction    EF 60%, echo, 2009, mildly calcified aortic leaflets   History of colonic polyps    Hyperlipidemia    Hypertension    Hypothyroidism    Lumbar radiculopathy    Multiple thyroid  nodules    Avascular echogenic areas noted in the right thyroid  at the time of carotid Doppler.    PONV (postoperative nausea and vomiting)    nausea with first hip surgery   Skin cancer (melanoma) (HCC)    PMH of    Spinal stenosis, lumbar    Tremor    Fine tremor right upper extremity   Venous insufficiency     Past Surgical History:  Procedure Laterality Date   COLONOSCOPY     CORONARY ANGIOPLASTY WITH STENT PLACEMENT  2006   x 2 stents; DES to CX and dRCA '06   KNEE SURGERY  1970's   chipped bone   LUMBAR LAMINECTOMY/DECOMPRESSION MICRODISCECTOMY Left 07/13/2016   Procedure: LUMBAR LAMINECTOMY AND FORAMINOTOMY Lumbar two, three, Lumbar three-four, Lumbar four-five, LEFT Lumbar five-Sacral one DISECTOMY;  Surgeon: Reyes Budge, MD;  Location: [facility] OR;  Service: Neurosurgery;  Laterality: Left;   TOE SURGERY  1997   TONSILLECTOMY     TOTAL HIP ARTHROPLASTY Left  2007   TOTAL HIP ARTHROPLASTY Right 2010   TOTAL KNEE ARTHROPLASTY Right 06/25/2014   Procedure: RIGHT TOTAL KNEE ARTHROPLASTY;  Surgeon: Dempsey Melodi GAILS, MD;  Location: [facility] ORS;  Service: Orthopedics;  Laterality: Right;   VENTRICULOPERITONEAL SHUNT Right 07/09/2022   Procedure: SHUNT INSERTION VENTRICULAR-PERITONEAL;  Surgeon: Budge Reyes, MD;  Location: [facility] OR;  Service: Neurosurgery;  Laterality: Right;    Current Outpatient Medications  Medication Instructions   ascorbic acid (VITAMIN C) 500 mg, Daily   aspirin  81 mg, Oral, Daily   atorvastatin  (LIPITOR) 60 mg, Oral, Daily at bedtime   benazepril  (LOTENSIN ) 30 mg, Oral, Daily   diltiazem  (DILT-XR) 180 mg, Oral, Daily   donepezil  (ARICEPT ) 10 mg, Oral, Daily at bedtime   Eliquis  5 mg, Oral, 2 times daily   fexofenadine (ALLEGRA) 180 mg, Daily   furosemide  (LASIX ) 40 MG tablet Take  1 tablet in the morning, 1 tablet at noon   levothyroxine  (SYNTHROID ) 200 mcg, Oral, Daily before breakfast   LUMIGAN 0.01 % SOLN 1 drop, Daily at bedtime   metoprolol  succinate (TOPROL -XL) 25 MG 24 hr tablet 1/2 tablet a day as needed for pulse > 80   nitroGLYCERIN  (NITROSTAT ) 0.4 MG SL tablet PLACE 1 TABLET UNDER THE TONGUE EVERY 5 MINUTES AS NEEDED FOR CHEST  PAIN   Timolol  Maleate 0.5 % (DAILY) SOLN 1 drop, 2 times daily   Vitamin D3 2,000 Units, Oral, Daily       Objective:   Physical Exam BP 126/80   Pulse 67   Temp (!) 97.5 F (36.4 C) (Oral)   Resp 16   Ht 5' 8 (1.727 m)   Wt 203 lb 6 oz (92.3 kg)   SpO2 95%   BMI 30.92 kg/m  General:   Well developed, NAD, BMI noted. HEENT:  Normocephalic . Face symmetric, atraumatic Lungs:  CTA B Normal respiratory effort, no intercostal retractions, no accessory muscle use. Heart: Irregularly irregular Lower extremities: Trace edema, wearing compression stockings. Skin: Not pale. Not jaundice Neurologic:  alert & oriented to self and place, not to time.SABRA  Speech normal, gait appropriate  for age. Psych--  Behavior appropriate. No anxious or depressed appearing.      Assessment     Assessment  Hyperglycemia, A1c 6.2 2013 HTN Hyperlipidemia Hypothyroidism  Thyroid  US  2014-- no nodules, inhomogeneous  CV: --CAD: stents 2006;  low risk nuclear scan 2011, 03-2016 --A Fib dx 09/2018 at regular cards OV, on Eliquis   --Carotid artery disease US  12/2014:  1-39% RICA stenosis.  Multiple other US ,, last US5/2024: 1 to 39%.  Stable,rec  no further routine ultrasounds R leg edema, US  (-)   DVT 08-2014 and 01/2019 [facility] DJD: Dr Hiram, back surgery Dr Mavis 06-2016 Venous insufficiency, chronic R>L edema Tremor Glaucoma Normal pressure hydrocephalus (ventriculoperitoneal shunt insertion October 2023, gait improved) H/o skin cancer , denies Melanoma; sees derm, had a visit ~ 09/2017, Dr Shona  PLAN DM: Last A1c 6.6, stable, diet controlled. HTN: BP looks good, no ABPs.  Continue Lotensin , Ilotycin, Lasix , metoprolol .  Check BMP. Hyperlipidemia: Continue atorvastatin  40 mg Hypothyroidism: On Synthroid , check TSH CAD, A-fib Saw cardiology 01/28/2024, felt to be stable, no changes made.  Anticoagulated without apparent problems, denies blood in the stools or in the urine.  Check CBC. Dementia: Stable.  On Aricept  10 mg daily Vitamin D  deficiency: Encouraged every day  supplements Rhinitis:  New, possibly vasomotor, because it increases with food intake.  On Allegra only, add Astepro Preventive care: Flu and COVID booster this fall.  Tdap. RTC 4 months CPX

## 2024-04-26 NOTE — Patient Instructions (Addendum)
 Vaccines I recommend: Flu shot and COVID booster this fall. Tetanus shot at the pharmacy (Tdap).  Take vitamin D  supplements over-the-counter every day  For runny nose: Over-the-counter Astepro: 2 sprays on each side of the nose twice daily.  Check the  blood pressure regularly Blood pressure goal:  between 110/65 and  135/85. If it is consistently higher or lower, let me know     GO TO THE LAB :  Get the blood work   Your results will be posted on MyChart with my comments  Next office visit for a physical exam in 4 months Please make an appointment before you leave today

## 2024-04-27 ENCOUNTER — Encounter: Payer: Self-pay | Admitting: Internal Medicine

## 2024-04-27 ENCOUNTER — Ambulatory Visit (INDEPENDENT_AMBULATORY_CARE_PROVIDER_SITE_OTHER)

## 2024-04-27 ENCOUNTER — Ambulatory Visit: Payer: Self-pay

## 2024-04-27 ENCOUNTER — Ambulatory Visit: Payer: Self-pay | Admitting: Internal Medicine

## 2024-04-27 ENCOUNTER — Other Ambulatory Visit: Payer: Self-pay

## 2024-04-27 DIAGNOSIS — E119 Type 2 diabetes mellitus without complications: Secondary | ICD-10-CM | POA: Diagnosis not present

## 2024-04-27 LAB — HEMOGLOBIN A1C: Hgb A1c MFr Bld: 6.6 % — ABNORMAL HIGH (ref 4.6–6.5)

## 2024-05-07 ENCOUNTER — Other Ambulatory Visit: Payer: Self-pay | Admitting: Internal Medicine

## 2024-05-16 DIAGNOSIS — L821 Other seborrheic keratosis: Secondary | ICD-10-CM | POA: Diagnosis not present

## 2024-05-16 DIAGNOSIS — I872 Venous insufficiency (chronic) (peripheral): Secondary | ICD-10-CM | POA: Diagnosis not present

## 2024-05-16 DIAGNOSIS — L570 Actinic keratosis: Secondary | ICD-10-CM | POA: Diagnosis not present

## 2024-06-30 ENCOUNTER — Other Ambulatory Visit: Payer: Self-pay | Admitting: Internal Medicine

## 2024-07-07 DIAGNOSIS — I872 Venous insufficiency (chronic) (peripheral): Secondary | ICD-10-CM | POA: Diagnosis not present

## 2024-07-07 DIAGNOSIS — L97829 Non-pressure chronic ulcer of other part of left lower leg with unspecified severity: Secondary | ICD-10-CM | POA: Diagnosis not present

## 2024-07-07 DIAGNOSIS — I83028 Varicose veins of left lower extremity with ulcer other part of lower leg: Secondary | ICD-10-CM | POA: Diagnosis not present

## 2024-07-19 ENCOUNTER — Ambulatory Visit: Admitting: Podiatry

## 2024-07-19 ENCOUNTER — Encounter: Payer: Self-pay | Admitting: Podiatry

## 2024-07-19 DIAGNOSIS — E119 Type 2 diabetes mellitus without complications: Secondary | ICD-10-CM

## 2024-07-19 DIAGNOSIS — E1151 Type 2 diabetes mellitus with diabetic peripheral angiopathy without gangrene: Secondary | ICD-10-CM

## 2024-07-19 DIAGNOSIS — M79674 Pain in right toe(s): Secondary | ICD-10-CM | POA: Diagnosis not present

## 2024-07-19 DIAGNOSIS — M2011 Hallux valgus (acquired), right foot: Secondary | ICD-10-CM

## 2024-07-19 DIAGNOSIS — M2012 Hallux valgus (acquired), left foot: Secondary | ICD-10-CM

## 2024-07-19 DIAGNOSIS — L84 Corns and callosities: Secondary | ICD-10-CM | POA: Diagnosis not present

## 2024-07-19 DIAGNOSIS — B351 Tinea unguium: Secondary | ICD-10-CM

## 2024-07-19 DIAGNOSIS — Z0189 Encounter for other specified special examinations: Secondary | ICD-10-CM

## 2024-07-19 DIAGNOSIS — M79675 Pain in left toe(s): Secondary | ICD-10-CM | POA: Diagnosis not present

## 2024-07-23 NOTE — Progress Notes (Signed)
  Subjective:  Patient ID: [name], male    DOB: 04-27-1936,  MRN: [id number]  [name] presents to clinic today for for annual diabetic foot examination and painful mycotic toenails of both feet that are difficult to trim. Pain interferes with daily activities and wearing enclosed shoe gear comfortably. His wife is present during today's visit. Chief Complaint  Patient presents with   RFC    RFC Non diabetic toenail trim. LOV with PCP 04/26/24.   New problem(s): None.   PCP is Amon Aloysius FORBES, MD.  Allergies  Allergen Reactions   Norvasc [Amlodipine Besylate] Swelling    Review of Systems: Negative except as noted in the HPI.  Objective: No changes noted in today's physical examination. There were no vitals filed for this visit. [name] is a pleasant 88 y.o. male in NAD. AAO x 3.   Diabetic foot exam was performed with the following findings:   Vascular Examination: CFT <3 seconds b/l. DP pulses faintly palpable b/l. PT pulses nonpalpable b/l. Digital hair absent. Skin temperature gradient warm to warm b/l. No pain with calf compression. No ischemia or gangrene. No cyanosis or clubbing noted b/l. +1 pitting edema noted BLE. Evidence of chronic venous insufficiency b/l LE.   Neurological Examination: Sensation grossly intact b/l with 10 gram monofilament.  Dermatological Examination: Pedal skin warm and supple b/l. No open wounds b/l. No interdigital macerations. Toenails 1-5 b/l thick, discolored, elongated with subungual debris and pain on dorsal palpation.  Hyperkeratotic lesion(s) submet head 1 right foot.  No erythema, no edema, no drainage, no fluctuance.  Musculoskeletal Examination: Muscle strength 5/5 to all lower extremity muscle groups bilaterally. HAV with bunion deformity noted b/l LE. Clawtoe deformity R 3rd toe.  Radiographs: None     Assessment/Plan: 1. Pain due to onychomycosis of toenails of both feet   2. Callus   3. Hallux valgus,  acquired, bilateral   4. Type II diabetes mellitus with peripheral circulatory disorder (HCC)   5. Encounter for diabetic foot exam (HCC)   Diabetic foot examination performed today. All patient's and/or POA's questions/concerns addressed on today's visit. Toenails 1-5 b/l debrided in length and girth without incident. Callus(es) submet head 1 right foot pared with sharp debridement without incident. Continue daily foot inspections and monitor blood glucose per PCP/Endocrinologist's recommendations.Continue soft, supportive shoe gear daily. Report any pedal injuries to medical professional. Call office if there are any questions/concerns. -Patient/POA to call should there be question/concern in the interim.   Return in about 3 months (around 10/19/2024).  [name], DPM      [place] LOCATION: [address] [place] [address]                   Office [phone]   [facility] LOCATION: [address] [place], [place] [address] Office [phone]

## 2024-08-22 ENCOUNTER — Other Ambulatory Visit: Payer: Self-pay | Admitting: Cardiovascular Disease

## 2024-08-22 DIAGNOSIS — R601 Generalized edema: Secondary | ICD-10-CM

## 2024-08-29 ENCOUNTER — Encounter: Admitting: Internal Medicine

## 2024-09-21 ENCOUNTER — Other Ambulatory Visit: Payer: Self-pay | Admitting: Internal Medicine

## 2024-09-27 ENCOUNTER — Other Ambulatory Visit: Payer: Self-pay | Admitting: Cardiovascular Disease

## 2024-09-27 DIAGNOSIS — I482 Chronic atrial fibrillation, unspecified: Secondary | ICD-10-CM

## 2024-10-26 ENCOUNTER — Encounter: Payer: Self-pay | Admitting: Internal Medicine

## 2024-10-31 ENCOUNTER — Ambulatory Visit: Admitting: Internal Medicine

## 2024-11-07 ENCOUNTER — Ambulatory Visit: Admitting: Podiatry

## 2025-02-20 ENCOUNTER — Ambulatory Visit
